# Patient Record
Sex: Male | Born: 1940
Health system: Southern US, Community
[De-identification: ages and names within clinical notes are randomized; demographics above are authoritative.]

## PROBLEM LIST (undated history)

## (undated) DIAGNOSIS — D369 Benign neoplasm, unspecified site: Secondary | ICD-10-CM

## (undated) DIAGNOSIS — G8929 Other chronic pain: Secondary | ICD-10-CM

## (undated) DIAGNOSIS — I2510 Atherosclerotic heart disease of native coronary artery without angina pectoris: Secondary | ICD-10-CM

## (undated) DIAGNOSIS — E785 Hyperlipidemia, unspecified: Secondary | ICD-10-CM

## (undated) DIAGNOSIS — I639 Cerebral infarction, unspecified: Secondary | ICD-10-CM

## (undated) DIAGNOSIS — M1990 Unspecified osteoarthritis, unspecified site: Secondary | ICD-10-CM

## (undated) DIAGNOSIS — K219 Gastro-esophageal reflux disease without esophagitis: Secondary | ICD-10-CM

## (undated) DIAGNOSIS — F419 Anxiety disorder, unspecified: Secondary | ICD-10-CM

## (undated) DIAGNOSIS — R55 Syncope and collapse: Secondary | ICD-10-CM

## (undated) DIAGNOSIS — M545 Low back pain, unspecified: Secondary | ICD-10-CM

## (undated) DIAGNOSIS — I10 Essential (primary) hypertension: Secondary | ICD-10-CM

## (undated) DIAGNOSIS — M5030 Other cervical disc degeneration, unspecified cervical region: Secondary | ICD-10-CM

## (undated) HISTORY — DX: Other cervical disc degeneration, unspecified cervical region: M50.30

## (undated) HISTORY — DX: Anxiety disorder, unspecified: F41.9

## (undated) HISTORY — DX: Benign neoplasm, unspecified site: D36.9

## (undated) HISTORY — DX: Hyperlipidemia, unspecified: E78.5

## (undated) HISTORY — DX: Atherosclerotic heart disease of native coronary artery without angina pectoris: I25.10

## (undated) HISTORY — DX: Syncope and collapse: R55

## (undated) HISTORY — PX: ANTERIOR CERVICAL DECOMP/DISCECTOMY FUSION: SHX1161

## (undated) HISTORY — DX: Gastro-esophageal reflux disease without esophagitis: K21.9

## (undated) HISTORY — PX: LUMBAR FUSION: SHX111

## (undated) HISTORY — PX: NEPHRECTOMY: SHX65

## (undated) HISTORY — DX: Essential (primary) hypertension: I10

## (undated) HISTORY — DX: Unspecified osteoarthritis, unspecified site: M19.90

## (undated) HISTORY — PX: JOINT REPLACEMENT: SHX530

## (undated) HISTORY — PX: BACK SURGERY: SHX140

---

## 1998-03-10 ENCOUNTER — Encounter: Payer: Self-pay | Admitting: Urology

## 1998-03-10 ENCOUNTER — Ambulatory Visit (HOSPITAL_COMMUNITY): Admission: RE | Admit: 1998-03-10 | Discharge: 1998-03-10 | Payer: Self-pay | Admitting: Urology

## 1998-07-04 ENCOUNTER — Encounter: Payer: Self-pay | Admitting: Cardiology

## 1998-07-04 ENCOUNTER — Inpatient Hospital Stay (HOSPITAL_COMMUNITY): Admission: EM | Admit: 1998-07-04 | Discharge: 1998-07-08 | Payer: Self-pay | Admitting: Emergency Medicine

## 1999-04-16 ENCOUNTER — Encounter: Payer: Self-pay | Admitting: Urology

## 1999-04-16 ENCOUNTER — Ambulatory Visit (HOSPITAL_COMMUNITY): Admission: RE | Admit: 1999-04-16 | Discharge: 1999-04-16 | Payer: Self-pay | Admitting: Urology

## 1999-09-06 ENCOUNTER — Encounter (INDEPENDENT_AMBULATORY_CARE_PROVIDER_SITE_OTHER): Payer: Self-pay | Admitting: *Deleted

## 1999-09-06 ENCOUNTER — Ambulatory Visit (HOSPITAL_COMMUNITY): Admission: RE | Admit: 1999-09-06 | Discharge: 1999-09-06 | Payer: Self-pay | Admitting: Gastroenterology

## 2000-02-07 ENCOUNTER — Encounter: Payer: Self-pay | Admitting: Cardiology

## 2000-02-07 ENCOUNTER — Ambulatory Visit (HOSPITAL_COMMUNITY): Admission: RE | Admit: 2000-02-07 | Discharge: 2000-02-07 | Payer: Self-pay | Admitting: Cardiology

## 2000-03-21 ENCOUNTER — Ambulatory Visit (HOSPITAL_COMMUNITY): Admission: RE | Admit: 2000-03-21 | Discharge: 2000-03-21 | Payer: Self-pay | Admitting: Family Medicine

## 2000-03-21 ENCOUNTER — Encounter: Payer: Self-pay | Admitting: Family Medicine

## 2000-07-27 ENCOUNTER — Encounter: Payer: Self-pay | Admitting: Urology

## 2000-07-27 ENCOUNTER — Ambulatory Visit (HOSPITAL_COMMUNITY): Admission: RE | Admit: 2000-07-27 | Discharge: 2000-07-27 | Payer: Self-pay | Admitting: Urology

## 2000-11-02 ENCOUNTER — Encounter: Payer: Self-pay | Admitting: Family Medicine

## 2000-11-02 ENCOUNTER — Ambulatory Visit (HOSPITAL_COMMUNITY): Admission: RE | Admit: 2000-11-02 | Discharge: 2000-11-02 | Payer: Self-pay | Admitting: Family Medicine

## 2001-02-01 ENCOUNTER — Emergency Department (HOSPITAL_COMMUNITY): Admission: EM | Admit: 2001-02-01 | Discharge: 2001-02-01 | Payer: Self-pay | Admitting: Emergency Medicine

## 2001-02-01 ENCOUNTER — Encounter: Payer: Self-pay | Admitting: Emergency Medicine

## 2001-07-18 ENCOUNTER — Encounter: Payer: Self-pay | Admitting: Urology

## 2001-07-18 ENCOUNTER — Ambulatory Visit (HOSPITAL_COMMUNITY): Admission: RE | Admit: 2001-07-18 | Discharge: 2001-07-18 | Payer: Self-pay | Admitting: *Deleted

## 2001-10-10 ENCOUNTER — Ambulatory Visit (HOSPITAL_COMMUNITY): Admission: RE | Admit: 2001-10-10 | Discharge: 2001-10-10 | Payer: Self-pay | Admitting: Family Medicine

## 2001-10-10 ENCOUNTER — Encounter: Payer: Self-pay | Admitting: Family Medicine

## 2002-08-08 ENCOUNTER — Encounter: Admission: RE | Admit: 2002-08-08 | Discharge: 2002-08-08 | Payer: Self-pay | Admitting: Urology

## 2002-08-08 ENCOUNTER — Encounter: Payer: Self-pay | Admitting: Urology

## 2002-09-07 HISTORY — PX: COLONOSCOPY W/ BIOPSIES AND POLYPECTOMY: SHX1376

## 2002-09-10 ENCOUNTER — Ambulatory Visit (HOSPITAL_COMMUNITY): Admission: RE | Admit: 2002-09-10 | Discharge: 2002-09-10 | Payer: Self-pay | Admitting: Gastroenterology

## 2002-09-10 ENCOUNTER — Encounter (INDEPENDENT_AMBULATORY_CARE_PROVIDER_SITE_OTHER): Payer: Self-pay | Admitting: Specialist

## 2002-10-03 ENCOUNTER — Encounter: Payer: Self-pay | Admitting: Family Medicine

## 2002-10-03 ENCOUNTER — Ambulatory Visit (HOSPITAL_COMMUNITY): Admission: RE | Admit: 2002-10-03 | Discharge: 2002-10-03 | Payer: Self-pay | Admitting: Family Medicine

## 2002-12-31 ENCOUNTER — Emergency Department (HOSPITAL_COMMUNITY): Admission: EM | Admit: 2002-12-31 | Discharge: 2002-12-31 | Payer: Self-pay | Admitting: *Deleted

## 2002-12-31 ENCOUNTER — Encounter: Payer: Self-pay | Admitting: *Deleted

## 2003-06-06 ENCOUNTER — Ambulatory Visit (HOSPITAL_COMMUNITY): Admission: RE | Admit: 2003-06-06 | Discharge: 2003-06-06 | Payer: Self-pay | Admitting: Family Medicine

## 2003-12-22 ENCOUNTER — Observation Stay (HOSPITAL_COMMUNITY): Admission: EM | Admit: 2003-12-22 | Discharge: 2003-12-23 | Payer: Self-pay | Admitting: *Deleted

## 2004-01-02 ENCOUNTER — Encounter: Admission: RE | Admit: 2004-01-02 | Discharge: 2004-01-02 | Payer: Self-pay | Admitting: *Deleted

## 2004-02-26 ENCOUNTER — Observation Stay (HOSPITAL_COMMUNITY): Admission: RE | Admit: 2004-02-26 | Discharge: 2004-02-27 | Payer: Self-pay | Admitting: Neurological Surgery

## 2004-09-28 HISTORY — PX: US ECHOCARDIOGRAPHY: HXRAD669

## 2004-11-06 HISTORY — PX: COLONOSCOPY: SHX174

## 2004-11-18 ENCOUNTER — Ambulatory Visit (HOSPITAL_COMMUNITY): Admission: RE | Admit: 2004-11-18 | Discharge: 2004-11-18 | Payer: Self-pay | Admitting: Gastroenterology

## 2005-03-25 ENCOUNTER — Encounter: Admission: RE | Admit: 2005-03-25 | Discharge: 2005-03-25 | Payer: Self-pay | Admitting: Neurological Surgery

## 2005-04-19 ENCOUNTER — Inpatient Hospital Stay (HOSPITAL_COMMUNITY): Admission: RE | Admit: 2005-04-19 | Discharge: 2005-04-20 | Payer: Self-pay | Admitting: Neurological Surgery

## 2005-05-13 ENCOUNTER — Ambulatory Visit (HOSPITAL_COMMUNITY): Admission: RE | Admit: 2005-05-13 | Discharge: 2005-05-13 | Payer: Self-pay | Admitting: Neurological Surgery

## 2005-06-07 ENCOUNTER — Encounter: Admission: RE | Admit: 2005-06-07 | Discharge: 2005-06-07 | Payer: Self-pay | Admitting: Neurological Surgery

## 2005-06-09 HISTORY — PX: SHOULDER ARTHROSCOPY WITH ROTATOR CUFF REPAIR: SHX5685

## 2005-06-29 ENCOUNTER — Ambulatory Visit (HOSPITAL_BASED_OUTPATIENT_CLINIC_OR_DEPARTMENT_OTHER): Admission: RE | Admit: 2005-06-29 | Discharge: 2005-06-30 | Payer: Self-pay | Admitting: Orthopedic Surgery

## 2005-08-07 HISTORY — PX: INCISION AND DRAINAGE OF WOUND: SHX1803

## 2005-08-11 ENCOUNTER — Ambulatory Visit (HOSPITAL_BASED_OUTPATIENT_CLINIC_OR_DEPARTMENT_OTHER): Admission: RE | Admit: 2005-08-11 | Discharge: 2005-08-12 | Payer: Self-pay | Admitting: Orthopedic Surgery

## 2005-11-06 HISTORY — PX: CARPAL TUNNEL RELEASE: SHX101

## 2005-12-05 ENCOUNTER — Ambulatory Visit (HOSPITAL_COMMUNITY): Admission: RE | Admit: 2005-12-05 | Discharge: 2005-12-05 | Payer: Self-pay | Admitting: Neurological Surgery

## 2007-06-10 HISTORY — PX: CARPAL TUNNEL RELEASE: SHX101

## 2007-07-05 ENCOUNTER — Ambulatory Visit (HOSPITAL_COMMUNITY): Admission: RE | Admit: 2007-07-05 | Discharge: 2007-07-05 | Payer: Self-pay | Admitting: Neurological Surgery

## 2008-03-27 ENCOUNTER — Encounter: Admission: RE | Admit: 2008-03-27 | Discharge: 2008-03-27 | Payer: Self-pay | Admitting: Neurological Surgery

## 2008-08-07 HISTORY — PX: REPLACEMENT TOTAL KNEE: SUR1224

## 2008-08-13 ENCOUNTER — Inpatient Hospital Stay (HOSPITAL_COMMUNITY): Admission: RE | Admit: 2008-08-13 | Discharge: 2008-08-15 | Payer: Self-pay | Admitting: Orthopedic Surgery

## 2008-12-14 ENCOUNTER — Inpatient Hospital Stay (HOSPITAL_COMMUNITY): Admission: EM | Admit: 2008-12-14 | Discharge: 2008-12-17 | Payer: Self-pay | Admitting: Emergency Medicine

## 2008-12-14 ENCOUNTER — Ambulatory Visit: Payer: Self-pay | Admitting: Internal Medicine

## 2008-12-14 HISTORY — PX: CARDIAC CATHETERIZATION: SHX172

## 2008-12-15 ENCOUNTER — Encounter (INDEPENDENT_AMBULATORY_CARE_PROVIDER_SITE_OTHER): Payer: Self-pay | Admitting: Internal Medicine

## 2008-12-15 HISTORY — PX: US ECHOCARDIOGRAPHY: HXRAD669

## 2009-12-03 ENCOUNTER — Encounter: Admission: RE | Admit: 2009-12-03 | Discharge: 2009-12-03 | Payer: Self-pay | Admitting: Family Medicine

## 2009-12-08 ENCOUNTER — Telehealth (INDEPENDENT_AMBULATORY_CARE_PROVIDER_SITE_OTHER): Payer: Self-pay

## 2009-12-09 ENCOUNTER — Encounter: Payer: Self-pay | Admitting: Cardiovascular Disease

## 2009-12-09 ENCOUNTER — Ambulatory Visit: Payer: Self-pay | Admitting: Cardiovascular Disease

## 2009-12-09 ENCOUNTER — Encounter (HOSPITAL_COMMUNITY): Admission: RE | Admit: 2009-12-09 | Discharge: 2010-02-03 | Payer: Self-pay | Admitting: Cardiology

## 2009-12-09 ENCOUNTER — Ambulatory Visit: Payer: Self-pay

## 2009-12-10 HISTORY — PX: CARDIOVASCULAR STRESS TEST: SHX262

## 2010-02-12 ENCOUNTER — Encounter: Admission: RE | Admit: 2010-02-12 | Discharge: 2010-02-12 | Payer: Self-pay | Admitting: Family Medicine

## 2010-05-14 ENCOUNTER — Encounter
Admission: RE | Admit: 2010-05-14 | Discharge: 2010-05-14 | Payer: Self-pay | Source: Home / Self Care | Attending: Family Medicine | Admitting: Family Medicine

## 2010-06-10 NOTE — Assessment & Plan Note (Signed)
Summary: Cardiology Nuclear Testing  Nuclear Med Background Indications for Stress Test: Evaluation for Ischemia   History: Echo, Heart Catheterization, Myocardial Perfusion Study  History Comments: '01 ZOX:WRUEAV, EF=59%; 8/10 Echo: EF=60-65%; 8/10 Cath: EF= 55-60%, moderate- severe CAD  Symptoms: Chest Pressure, Diaphoresis, DOE, Fatigue, Palpitations, Syncope  Symptoms Comments: CP>back. Last episode of WU:JWJX night.   Nuclear Pre-Procedure Cardiac Risk Factors: Family History - CAD, History of Smoking, Hypertension, Lipids, TIA Caffeine/Decaff Intake: none NPO After: 12:00 AM Lungs: Clear IV 0.9% NS with Angio Cath: 22g     IV Site: (R) AC IV Started by: Irean Hong RN Chest Size (in) 48     Height (in): 68 Weight (lb): 223 BMI: 34.03 Tech Comments: Held Metoprolol for 48 x hours  Nuclear Med Study 1 or 2 day study:  1 day     Stress Test Type:  Stress Reading MD:  Charlton Haws, MD     Referring MD:  [name], MD Resting Radionuclide:  Technetium 34m Tetrofosmin     Resting Radionuclide Dose:  10.9 mCi  Stress Radionuclide:  Technetium 72m Tetrofosmin     Stress Radionuclide Dose:  33.0 mCi   Stress Protocol Exercise Time (min):  4:30 min     Max HR:  144 bpm     Predicted Max HR:  153 bpm  Max Systolic BP: 201 mm Hg     Percent Max HR:  94.12 %     METS: 6.4 Rate Pressure Product:  91478    Stress Test Technologist:  Rea [employer] CMA-N     Nuclear Technologist:  Domenic Polite CNMT  Rest Procedure  Myocardial perfusion imaging was performed at rest 45 minutes following the intravenous administration of Myoview Technetium 82m Tetrofosmin.  Stress Procedure  The patient exercised for 4:30.  The patient stopped due to fatigue and denied any chest pain.  There were nonspecific ST-T wave changes and a mild hypertensive response to exercise.  Myoview was injected at peak exercise and myocardial perfusion imaging was performed after a brief delay.  QPS Raw Data  Images:  Motion artifact in both image sets Stress Images:  NI: Uniform and normal uptake of tracer in all myocardial segments. Rest Images:  Normal homogeneous uptake in all areas of the myocardium. Subtraction (SDS):  SDS 2 Transient Ischemic Dilatation:  .92  (Normal <1.22)  Lung/Heart Ratio:  .32  (Normal <0.45)  Quantitative Gated Spect Images QGS EDV:  124 ml QGS ESV:  48 ml QGS EF:  61 % QGS cine images:  normal  Findings Low risk nuclear study Evidence for inferior ischemia      Overall Impression  Exercise Capacity: Poor exercise capacity. BP Response: Hypertensive blood pressure response. Clinical Symptoms: Dysnpnea ECG Impression: No significant ST segment change suggestive of ischemia. Overall Impression: Possible small inferior wall infarct at mid and apical level with mild peri infarct ischemia Overall Impression Comments: Quality of study diminished by motion artifact

## 2010-06-10 NOTE — Progress Notes (Signed)
Summary: Nuc pre-procedure  Phone Note Outgoing Call Call back at cell # [phone]   Call placed by: Irean Hong, RN,  December 08, 2009 2:35 PM Summary of Call: Reviewed information on Myoview Information Sheet (see scanned document for further details).  Spoke with pt. by Darrick Penna      Nuclear Med Background Indications for Stress Test: Evaluation for Ischemia   History: Echo, Heart Catheterization, Myocardial Perfusion Study  History Comments: 01 MPS EF=59% normal. 08/10 Echo EF=60-65%. 08/10 Cath EF= 55-60%, moderate severe diagonal of RCA.   Symptoms: Chest Pain, DOE, Syncope  Symptoms Comments: Chest pain is bilateral and into back.   Nuclear Pre-Procedure Cardiac Risk Factors: Family History - CAD, Hypertension, Lipids

## 2010-06-14 ENCOUNTER — Ambulatory Visit: Payer: Self-pay | Admitting: Cardiology

## 2010-08-14 LAB — DIFFERENTIAL
Basophils Absolute: 0 10*3/uL (ref 0.0–0.1)
Basophils Relative: 0 % (ref 0–1)
Eosinophils Absolute: 0.1 10*3/uL (ref 0.0–0.7)
Eosinophils Relative: 2 % (ref 0–5)
Lymphocytes Relative: 23 % (ref 12–46)
Lymphs Abs: 1.2 10*3/uL (ref 0.7–4.0)
Monocytes Absolute: 0.8 10*3/uL (ref 0.1–1.0)
Monocytes Relative: 16 % — ABNORMAL HIGH (ref 3–12)
Neutro Abs: 3.1 10*3/uL (ref 1.7–7.7)
Neutrophils Relative %: 58 % (ref 43–77)

## 2010-08-14 LAB — BASIC METABOLIC PANEL
BUN: 11 mg/dL (ref 6–23)
CO2: 24 mEq/L (ref 19–32)
CO2: 24 mEq/L (ref 19–32)
Calcium: 8.1 mg/dL — ABNORMAL LOW (ref 8.4–10.5)
Calcium: 8.4 mg/dL (ref 8.4–10.5)
Calcium: 8.4 mg/dL (ref 8.4–10.5)
Calcium: 9.4 mg/dL (ref 8.4–10.5)
Chloride: 107 mEq/L (ref 96–112)
Chloride: 107 mEq/L (ref 96–112)
Chloride: 109 mEq/L (ref 96–112)
Creatinine, Ser: 1.06 mg/dL (ref 0.4–1.5)
Creatinine, Ser: 1.12 mg/dL (ref 0.4–1.5)
Creatinine, Ser: 1.24 mg/dL (ref 0.4–1.5)
GFR calc Af Amer: >60 mL/min (ref >60)
GFR calc Af Amer: >60 mL/min (ref >60)
GFR calc Af Amer: >60 mL/min (ref >60)
GFR calc Af Amer: >60 mL/min (ref >60)
GFR calc non Af Amer: >60 mL/min (ref >60)
GFR calc non Af Amer: >60 mL/min (ref >60)
Glucose, Bld: 88 mg/dL (ref 70–99)
Glucose, Bld: 95 mg/dL (ref 70–99)
Glucose, Bld: 98 mg/dL (ref 70–99)
Potassium: 3.9 mEq/L (ref 3.5–5.1)
Sodium: 136 mEq/L (ref 135–145)
Sodium: 139 mEq/L (ref 135–145)

## 2010-08-14 LAB — POCT CARDIAC MARKERS
CKMB, poc: <1 ng/mL — ABNORMAL LOW (ref 1.0–8.0)
CKMB, poc: <1 ng/mL — ABNORMAL LOW (ref 1.0–8.0)
Myoglobin, poc: 45.8 ng/mL (ref 12–200)
Myoglobin, poc: 52.9 ng/mL (ref 12–200)
Troponin i, poc: <0.05 ng/mL (ref 0.00–0.09)
Troponin i, poc: <0.05 ng/mL (ref 0.00–0.09)
Troponin i, poc: <0.05 ng/mL (ref 0.00–0.09)

## 2010-08-14 LAB — CBC
HCT: 33.2 % — ABNORMAL LOW (ref 39.0–52.0)
HCT: 33.6 % — ABNORMAL LOW (ref 39.0–52.0)
Hemoglobin: 11.4 g/dL — ABNORMAL LOW (ref 13.0–17.0)
Hemoglobin: 11.7 g/dL — ABNORMAL LOW (ref 13.0–17.0)
Hemoglobin: 11.8 g/dL — ABNORMAL LOW (ref 13.0–17.0)
MCHC: 34.3 g/dL (ref 30.0–36.0)
MCHC: 35 g/dL (ref 30.0–36.0)
MCHC: 35 g/dL (ref 30.0–36.0)
MCV: 88.7 fL (ref 78.0–100.0)
MCV: 88.7 fL (ref 78.0–100.0)
MCV: 89.2 fL (ref 78.0–100.0)
Platelets: 160 10*3/uL (ref 150–400)
Platelets: 164 10*3/uL (ref 150–400)
RBC: 3.72 MIL/uL — ABNORMAL LOW (ref 4.22–5.81)
RBC: 3.79 MIL/uL — ABNORMAL LOW (ref 4.22–5.81)
RBC: 3.81 MIL/uL — ABNORMAL LOW (ref 4.22–5.81)
RBC: 4.56 MIL/uL (ref 4.22–5.81)
RDW: 14.1 % (ref 11.5–15.5)
RDW: 14.5 % (ref 11.5–15.5)
RDW: 14.6 % (ref 11.5–15.5)
WBC: 4.9 10*3/uL (ref 4.0–10.5)
WBC: 5.3 10*3/uL (ref 4.0–10.5)
WBC: 5.8 10*3/uL (ref 4.0–10.5)

## 2010-08-14 LAB — PROTIME-INR
INR: 0.9 (ref 0.00–1.49)
Prothrombin Time: 12.4 seconds (ref 11.6–15.2)

## 2010-08-14 LAB — LIPID PANEL
HDL: 32 mg/dL — ABNORMAL LOW (ref >39)
Total CHOL/HDL Ratio: 5 RATIO
Triglycerides: 240 mg/dL — ABNORMAL HIGH (ref <150)

## 2010-08-14 LAB — D-DIMER, QUANTITATIVE: D-Dimer, Quant: 0.38 ug/mL-FEU (ref 0.00–0.48)

## 2010-08-14 LAB — CK TOTAL AND CKMB (NOT AT ARMC)
CK, MB: 0.9 ng/mL (ref 0.3–4.0)
Relative Index: INVALID (ref 0.0–2.5)
Total CK: 34 U/L (ref 7–232)

## 2010-08-18 LAB — COMPREHENSIVE METABOLIC PANEL
AST: 24 U/L (ref 0–37)
CO2: 23 mEq/L (ref 19–32)
Calcium: 9.1 mg/dL (ref 8.4–10.5)
Creatinine, Ser: 1.11 mg/dL (ref 0.4–1.5)
GFR calc Af Amer: >60 mL/min (ref >60)
GFR calc non Af Amer: >60 mL/min (ref >60)
Sodium: 140 mEq/L (ref 135–145)
Total Protein: 6.5 g/dL (ref 6.0–8.3)

## 2010-08-18 LAB — URINALYSIS, ROUTINE W REFLEX MICROSCOPIC
Ketones, ur: NEGATIVE mg/dL
Nitrite: NEGATIVE
Protein, ur: NEGATIVE mg/dL
pH: 6.5 (ref 5.0–8.0)

## 2010-08-18 LAB — CBC
MCHC: 35.1 g/dL (ref 30.0–36.0)
MCHC: 35.7 g/dL (ref 30.0–36.0)
MCV: 92.3 fL (ref 78.0–100.0)
MCV: 93.1 fL (ref 78.0–100.0)
Platelets: 164 10*3/uL (ref 150–400)
Platelets: 198 10*3/uL (ref 150–400)
RBC: 4.36 MIL/uL (ref 4.22–5.81)
RDW: 13.4 % (ref 11.5–15.5)
RDW: 13.9 % (ref 11.5–15.5)
WBC: 6.1 10*3/uL (ref 4.0–10.5)

## 2010-08-18 LAB — BASIC METABOLIC PANEL
BUN: 14 mg/dL (ref 6–23)
BUN: 8 mg/dL (ref 6–23)
Calcium: 7.9 mg/dL — ABNORMAL LOW (ref 8.4–10.5)
Calcium: 8.5 mg/dL (ref 8.4–10.5)
Chloride: 103 mEq/L (ref 96–112)
Creatinine, Ser: 0.84 mg/dL (ref 0.4–1.5)
GFR calc non Af Amer: >60 mL/min (ref >60)
Glucose, Bld: 107 mg/dL — ABNORMAL HIGH (ref 70–99)
Sodium: 135 mEq/L (ref 135–145)

## 2010-08-18 LAB — PROTIME-INR
INR: 1.8 — ABNORMAL HIGH (ref 0.00–1.49)
Prothrombin Time: 12.5 seconds (ref 11.6–15.2)
Prothrombin Time: 14.8 seconds (ref 11.6–15.2)
Prothrombin Time: 21.5 seconds — ABNORMAL HIGH (ref 11.6–15.2)

## 2010-08-18 LAB — ABO/RH: ABO/RH(D): A POS

## 2010-08-18 LAB — TYPE AND SCREEN: ABO/RH(D): A POS

## 2010-08-18 LAB — APTT: aPTT: 35 seconds (ref 24–37)

## 2010-09-21 NOTE — H&P (Signed)
Jake Dunn, Jake Dunn                ACCOUNT NO.:  [id number]   MEDICAL RECORD NO.:  [id number]          PATIENT TYPE:  INP   LOCATION:  2920                         FACILITY:  [facility]   PHYSICIAN:  Kela Millin, M.D.DATE OF BIRTH:  01-Feb-1941   DATE OF ADMISSION:  12/14/2008  DATE OF DISCHARGE:                              HISTORY & PHYSICAL   PRIMARY CARE PHYSICIAN:  Dr. Elias Else.   CARDIOLOGIST:  Dr. [name].   CHIEF COMPLAINT:  Chest pain.   HISTORY OF PRESENT ILLNESS:  The patient is a 70 year old white male  with past medical history significant for hypertension, hyperlipidemia,  who presents with complaints of worsening chest pain over the past 3  days.  He describes the chest pain as left precordial in location with  radiation to his back, sharp, 8-10 out of 10 in intensity, constant over  the past 3 days but with worsening as already mentioned.  He admits to  associated shortness of breath as well as nausea but no vomiting, and  ?diaphoresis.  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  He states  that the pain is aggravated by leaning forward and certain movements.  He admits to mild nonproductive cough over the past 2 days.  He denies  fevers, orthopnea, PND, dysuria, melena, hematochezia and no diarrhea.  He also denies any paresthesias, palpitations, dizziness.   He was seen in the ED and an EKG was negative for acute ischemic  changes, his point of care markers were negative, he was noted to be  mildly tachycardiac and a CT angiogram of his chest and abdomen was  obtained and it was negative for PE, also no aortic dissection.  It was  noted that there was a minimal right lower lobe opacity - pneumonia  versus atelectasis.  He was given sublingual nitroglycerin as well as  aspirin and subsequently IV morphine with no relief of his symptoms and  he is admitted for further evaluation  and management.  Upon review of  his E chart records it is noted that the patient had an adenosine  Cardiolite with limited exercise in August 2005 that showed no  significant perfusion defect, his ejection fraction was noted to be 45-  50%.   PAST MEDICAL HISTORY:  1. As above.  2. History of osteoarthritis and status post right total knee      replacement in March 2010.  3. History of renal cell cancer and status post left nephrectomy.  4. The history of cervical spondylosis with myelopathy and status post      surgery.  5. History of low back pain and status post surgeries in the past.   MEDICATIONS:  1. Nabumetone 500 mg p.o. daily.  2. Plavix 75 mg p.o. daily.  3. Ativan 0.5 mg t.i.d.  4. Pravastatin 40 mg p.o. daily.  5. Valtrex 500 mg p.o. daily.  6. Nifedipine 30 mg p.o. daily.   ALLERGIES:  NKDA.   SOCIAL HISTORY:  He quit tobacco some 30 years  ago after smoking  briefly.  Occasional beer.   FAMILY HISTORY:  Positive for coronary artery disease in his father and  brother.   REVIEW OF SYSTEMS:  As per HPI, other review of systems negative.   PHYSICAL EXAM:  GENERAL:  The patient is an obese elderly white male, in  moderate distress secondary to pain, no respiratory distress.  VITAL SIGNS:  His blood pressure is 144/87, initially 204/108, his pulse  is 85, highest pulse in the ED 110, respiratory rate is 18 and O2  saturation is 100%.  HEENT:  PERRL, EOMI, sclerae anicteric, moist mucous membranes and no  oral exudates.  NECK:  Supple, no adenopathy, no thyromegaly and no JVD.  LUNGS:  Decreased breath sounds at the bases, no crackles or wheezes.  CARDIOVASCULAR:  Regular rate and rhythm.  Normal S1-S2.  No S3  appreciated.  No reproducible chest wall tenderness.  ABDOMEN:  Obese, bowel sounds present, nontender, nondistended.  No  organomegaly and no masses palpable.  EXTREMITIES:  No cyanosis and no edema.  NEURO:  He is alert and oriented x3.  Cranial nerves  II-XII grossly  intact.  Nonfocal exam.   LABORATORY DATA:  CT angiogram of the chest and abdomen as above.  Chest  x-ray - no acute cardiopulmonary findings.  Point of care markers  negative x1.  His white cell count is 5.3, hemoglobin 13.8, hematocrit  40.4, platelet count 189, neutrophil count of 58%.  His sodium is 141  with a potassium of 3.9.  His chloride is 107, CO2 of 24, glucose of 86,  BUN 16, creatinine of 1.12, calcium is 9.4.  His D-dimer is 0.38.   ASSESSMENT AND PLAN:  Problem #1.  Chest pain - cannot rule out unstable  angina, although chest pain atypical.  CT angiogram negative for PE and  no dissection.  It does reveal minimal infiltrates - right lower lobe  atelectasis versus pneumonia.  Will start on nitro paste, continue  aspirin, also place on beta blocker.  Cycle cardiac enzymes and consult  cardiology for further recommendations - as already noted above his  cardiologist is Dr. Swaziland.  Will place the patient on empiric  antibiotics for possible pneumonia.  We will also place on a PPI, and  continue his benzodiazepines for anxiety.  Problem #2.  Uncontrolled hypertension - initially markedly elevated  blood pressures improved on recheck in the ED as discussed above.  Will  place on beta blockers for now, monitor blood pressures in step-down  unit and further manage accordingly.  Problem #3.  History of hyperlipidemia - continue pravastatin.  Problem #4.  History of renal cell cancer - status post left nephrectomy  in the past.  Problem #5.  History of osteoarthritis - as above status post right knee  replacement in March 2010.      Kela Millin, M.D.  Electronically Signed     ACV/MEDQ  D:  12/15/2008  T:  12/16/2008  Job:  [id number]   cc:   Dr. [name]  Dr. Nicholos Johns

## 2010-09-21 NOTE — Op Note (Signed)
Jake Dunn, Jake Dunn NO.:  [id number]   MEDICAL RECORD NO.:  [id number]          PATIENT TYPE:  INP   LOCATION:  5033                         FACILITY:  [facility]   PHYSICIAN:  Loreta Ave, M.D. DATE OF BIRTH:  21-May-1940   DATE OF PROCEDURE:  DATE OF DISCHARGE:                               OPERATIVE REPORT   PREOPERATIVE DIAGNOSES:  End-stage degenerative arthritis right knee,  most marked lateral compartment, patellofemoral joint.   POSTOPERATIVE DIAGNOSIS:  End-stage degenerative arthritis right knee,  most marked lateral compartment, patellofemoral joint.   PROCEDURE:  Right total knee replacement utilizing Stryker triathlon  prosthesis.  Modified minimally invasive approach.  Soft tissue  balancing including lateral retinacular release.  Cemented pegged  posterior stabilized #6 femoral component.  Cemented #6 tibial  component, 9-mm polyethylene insert.  Resurfacing of 38-mm patellar  component.   SURGEON:  Loreta Ave, MD   ASSISTANT:  Genene Churn. Barry Dienes, [place], present throughout the entire case and  necessary for timely completion of the procedure.   ANESTHESIA:  General   BLOOD LOSS:  Minimal.   SPECIMENS:  None.   CULTURES:  None.   COMPLICATIONS:  None.   DRESSINGS:  Soft compressive with knee immobilizer.   DRAIN:  Hemovac x1.   TOURNIQUET TIME:  One hour and 15 minutes.   PROCEDURE IN DETAIL:  The patient was brought to the operating room,  placed on the operating table in supine position.  After adequate  anesthesia was obtained, right knee examined.  Fairly good extension and  flexion.  Valgus alignment correctable.  Marked lateral patellofemoral  tracking tethering.  Tourniquet applied.  Prepped and draped in usual  sterile fashion.  Exsanguinated with elevation Esmarch.  Tourniquet  inflated to 350 mmHg.  Straight incision above the patella down to  tibial tubercle.  Medial arthrotomy vastus splitting preserving quad  tendon.   Knee exposed.  Marked tethering lateral patellofemoral joint.  Distal femur exposed.  Intramedullary guide placed.  Distal cut 10 mm  set at 5 degrees of valgus.  Using the epicondylar axis sized, cut, and  fitted for #6 component.  Proximal tibial resection extramedullary guide  3 degrees posterior slope cut.  Size #6 component.  Little bit of a  capsular release laterally.  Trials put in place.  #6 above, #6 below,  and with a 9-mm insert full extension, full flexion, nicely balanced  knee.  Patella exposed.  Size drilled and fitted for 38-mm component.  So much tethering, I had to do a lateral release, which was performed  from the vastus lateralis superiorly down to a lateral joint line  inferiorly.  With that, I was very pleased with the patellofemoral  tracking alignment, stability.  Tibia was marked for appropriate  rotation with trials.  It was then hand reamed.  All trials removed.  Copious irrigation with pulse irrigating device.  Cement prepared and  placed on all components.  All were firmly seated.  Polyethylene  attached to tibia and knee reduced.  Clamp on the patella.  Once cement  hardened, reexamined.  Full extension,  full flexion, good alignment,  good stability, good patellofemoral tracking.  Hemovac placed through a  separate stab wound, brought out laterally.  Wound irrigated.  Arthrotomy closed #1 Vicryl.  Skin and subcutaneous tissue with Vicryl  and staples.  Knee injected with Marcaine.  Hemovac clamped.  Sterile  compressive dressing applied.  Tourniquet was inflated and removed.  Knee immobilizer applied. Anesthesia reversed.  Brought to recovery  room.  Tolerated surgery well.  No complications.      Loreta Ave, M.D.  Electronically Signed     DFM/MEDQ  D:  08/14/2008  T:  08/15/2008  Job:  [id number]

## 2010-09-21 NOTE — Op Note (Signed)
Jake Dunn, Jake Dunn                ACCOUNT NO.:  [id number]   MEDICAL RECORD NO.:  [id number]          PATIENT TYPE:  AMB   LOCATION:  SDS                          FACILITY:  [facility]   PHYSICIAN:  Stefani Dama, M.D.  DATE OF BIRTH:  1940-10-03   DATE OF PROCEDURE:  07/05/2007  DATE OF DISCHARGE:                               OPERATIVE REPORT   PREOPERATIVE DIAGNOSIS:  Left carpal tunnel syndrome.   POSTOPERATIVE DIAGNOSIS:  Left carpal tunnel syndrome.   PROCEDURE:  Left carpal tunnel release   SURGEON:  Stefani Dama, M.D.   ANESTHESIA:  General endotracheal.   INDICATIONS:  Jake Dunn is a 70 year old male who has had problems  with spondylosis and bilateral carpal tunnel syndrome.  He has had a  previous release of the right carpal tunnel, now has left carpal tunnel  symptoms and wishes to undergo a transcarpal release.  He is taken to  the operating room.   PROCEDURE:  The patient was brought to the operating room and after the  induction of some modest sedation with propofol, the left upper  extremity was prepped with DuraPrep and draped sterilely.  A stockinette  over the left wrist was opened then and the region of the midportion of  the wrist was marked and a line was drawn from the midportion of the  wrist to the fourth ray.  An incision was created along this area after  infiltrating with 6 mL of lidocaine 1% with epinephrine.  As the  subcutaneous tissues were divided and then the transcarpal ligament was  identified, the ligament was divided and sectioned very carefully until  the underlying nerve was identified.  The nerve was noted to spring  through the opening of the transcarpal ligament and care was taken to  protect the nerve as the division was carried down the length of the  transcarpal ligament, first distally and then more proximally under the  wrist crease.  Once the transcarpal ligament was divided completely,  care was taken to make sure that no  branches were transected during this  opening and hemostasis was then achieved very carefully.  The skin was  then closed with interrupted 4-0 nylon sutures in a vertical mattress  closure.  A dry sterile dressing was applied to the wrist.  The patient  tolerated the procedure well.  Blood loss was nil.      Stefani Dama, M.D.  Electronically Signed     HJE/MEDQ  D:  07/05/2007  T:  07/06/2007  Job:  [id number]

## 2010-09-21 NOTE — Consult Note (Signed)
NAMEJAVELL, BLACKBURN NO.:  [id number]   MEDICAL RECORD NO.:  [id number]          PATIENT TYPE:  INP   LOCATION:  1824                         FACILITY:  [facility]   PHYSICIAN:  Bevelyn Buckles. Bensimhon, MDDATE OF BIRTH:  01/03/1941   DATE OF CONSULTATION:  12/14/2008  DATE OF DISCHARGE:                                 CONSULTATION   PRIMARY CARDIOLOGIST:  Peter M. Swaziland, MD.   REASON FOR CONSULTATION:  Chest pain.   CONSULTING PHYSICIAN:  [facility], clinical.   HISTORY OF PRESENT ILLNESS:  Mr. Mcneill a very pleasant 70 year old male  with a history of hypertension, hyperlipidemia, and a solitary kidney  status post left nephrectomy due to renal cell carcinoma, and  degenerative joint disease status post right knee replacement in March.  He denies any history of known coronary artery disease.  He had a cath  he says about 10 years ago with Dr. Swaziland and was told his coronaries  were okay at that time.  He was admitted in August of 2005 with chest  pain and had a Myoview, which showed an EF of 45 to 50% with no evidence  of ischemia or infarct.  His EF was thought secondary to hypertensive  heart disease.   He tells me he was doing well until Thursday.  Since that time, he has  had stuttering left-sided chest pain.  This morning, the chest pain was  much more severe and it lasted all day.  It was radiating through to his  back and is  associated with diaphoresis, nausea, and shortness of  breath.   He came to the emergency room.  His EKG was normal.  He had 2 sets of  point-of-care markers that were normal.  He had a CT scan of the chest  and abdomen, which showed no evidence of a pulmonary embolus or  dissection.  There was question of a minimal right lower lobe opacity, a  question of developing pneumonia.  He was treated with morphine and  nitroglycerin with no significant relief.   REVIEW OF SYSTEMS:  He denies any diarrhea, no fevers, no chills, no  focal neurologic defects.  He has not had any dysuria, no cough.  He  does have some arthritis pain, which is chronic.  He has not had any  trauma to the chest wall.  The remainder of the review of systems is  negative for HPI and Problem List.   PROBLEM LIST:  1. History of atypical chest pain.      a.     Status post cardiac catheterization approximately 10 years       ago.  The coronaries are okay.      b.     A Myoview in 2005 showed an EF of 45 to 50% with no ischemia       or infarct, EF thought due hypertensive heart disease.  2. Severe hypertension.  3. Hyperlipidemia.  4. Solitary kidney status post left nephrectomy secondary to renal      cell carcinoma.  5. Degenerative joint disease.  6. History of herpes  infection.   CURRENT MEDICATIONS:  Include:  1. Pravastatin 40 a day.  2. Plavix 75 a day.  3. Valtrex.  4. Nabumetone.  5. Procardia 30 mg a day.   ALLERGIES:  No known drug allergies.   SOCIAL HISTORY:  He is married.  He is a retired Visual merchandiser.  He has a  history of just very minimal tobacco use, none currently, no significant  alcohol.   FAMILY HISTORY:  Notable for coronary artery disease in his father and  his brother, and his brother had coronary artery bypass grafting in  2005.   PHYSICAL EXAMINATION:  GENERAL:  He is uncomfortable.  VITAL SIGNS:  Respirations are unlabored.  Blood pressure currently is  133/77, heart rate 73, he is sating 100% on 2 L nasal cannula.  HEENT:  Normal.  NECK:  Supple.  No obvious JVD.  Carotid are 2+ bilaterally without any  bruits.  There is no lymphadenopathy or thyromegaly appreciated.  CARDIAC:  PMI is nonpalpable.  He has distant heart sounds.  He is  regular with no obvious murmurs, rubs, or gallops.  LUNGS:  Clear.  ABDOMEN:  Obese, nontender, and nondistended.  No hepatosplenomegaly, no  bruits, no masses.  EXTREMITIES:  Warm with no cyanosis, clubbing, or  edema.  He has a scar on his right knee from his previous  knee  replacement.  Distal pulses are good.  NEURO:  Alert and oriented x3.  Cranial nerves II-XII are intact.  He  moves all 4 extremities without difficulty. Affect is pleasant.   EKG shows sinus rhythm with no evidence of ST-T wave abnormalities.  Point-of-care markers are negative x2 with troponin of less than 0.05  and a CK-MB of less than 1.  Sodium 141, potassium 3.9, BUN of 16,  creatinine of 1.12, glucose 86.  D-dimer was 0.38.  INR was 0.9.  CBC:  White count of 35.3, hemoglobin 13.8, platelets of 189.  A CT of the  abdomen and chest showed no evidence of PE or dissection.  There was a  minimal opacity in the right lower lobe, which thought was likely due to  atelectasis but could not exclude developing pneumonia.   ASSESSMENT:  1. Severe chest pain with a negative computerized tomography scan as      above.  2. Severe hypertension.  3. Solitary kidney secondary to left nephrectomy due to renal cell      carcinoma.   PLAN/DISCUSSION:  His chest pain and his associated symptoms are very  concerning for unstable angina, but there are no objective signs of  ischemia despite prolonged chest pain.  We are treating him with beta  blocker, nitroglycerin, heparin, aspirin, Plavix, and narcotics.  If we  are unable to get him comfortable, he will likely need  cardiac catheterization tonight.  If we can get him comfortable, we will  plan on doing it in the morning.  We will check an echocardiogram and we  will hydrate him pre-cath as he is high risk for contrast nephropathy  with his single kidney and the dye exposure he has already had today.      Bevelyn Buckles. Bensimhon, MD  Electronically Signed     DRB/MEDQ  D:  12/14/2008  T:  12/14/2008  Job:  [id number]

## 2010-09-21 NOTE — Discharge Summary (Signed)
Jake Dunn, Jake Dunn                ACCOUNT NO.:  [id number]   MEDICAL RECORD NO.:  [id number]          PATIENT TYPE:  INP   LOCATION:  2920                         FACILITY:  [facility]   PHYSICIAN:  Kela Millin, M.D.DATE OF BIRTH:  [birth year]   DATE OF ADMISSION:  12/14/2008  DATE OF DISCHARGE:  12/17/2008                               DISCHARGE SUMMARY   DISCHARGE DIAGNOSES:  1. Recurrent chest pain - Noncardiac per Cardiology.  2. Coronary artery disease - Status post cardiac cath on December 14, 2008, by Dr. Deborah Chalk revealing moderate to moderately severe 2-      vessel coronary artery disease with 95% stenosis in the small acute      marginal branch of the right coronary artery with 50% to 60%      narrowing beyond the crux and diffuse but severe disease in 2      diagonal vessels of the left anterior descending.  Dr. Deborah Chalk      noted that it was hard to know which one of these vessels might be      a culprit vessel even his persistent chest pain was possibly      related to angina.  3. Probable pneumonia, right lower lobe.  4. Uncontrolled hypertension.  5. History of anxiety.  6. History of hyperlipidemia.  7. History of renal cell cancer - Status post left nephrectomy in the      past.  8. History of osteoarthritis.   PROCEDURES AND STUDIES:  1. Cardiac catheterization on December 14, 2008 - Normal left ventricular      function.  Ejection fraction 55% to 60%.  Moderate to moderately      severe 2-vessel coronary artery disease with 95% stenosis in the      small acute marginal branch of the right coronary artery with 50%      to 60% narrowing beyond the crux and diffuse but severe disease in      2 diagonal vessels of the left anterior descending.  2. CT angiogram of the chest - No evidence of thoracic aortic      dissection.  The origins of the great vessels appear patent.  No      evidence of acute PE.  Minimal opacity posteriorly in the right      lower lobe may  be due to atelectasis.  Developing pneumonia cannot      be excluded.  3. CT angiogram of the abdomen - No evidence of abdominal aortic      dissection.  Mild atheromatous change.  No acute abnormality on the      CT of the abdomen.   BRIEF HISTORY:  The patient is a 70 year old white male with the above-  listed medical problems who presented with chest pain described as left  precordial in location with radiation to his back, 8 to 10/10 in  intensity, and constant over 3 days but with worsening in the intensity.  He reported that the pain was aggravated by leaning forward and certain  movements.  He admitted to a mild  nonproductive cough in the 2 days  prior to admission.  He admitted to nausea and shortness of breath been  associated with the chest pain.  He was admitted for further evaluation  and management.   Please see the full dictated admission history and physical for the  details of the admission physical exam, as well as the laboratory data.   HOSPITAL COURSE:  1. Severe chest pain - Upon admission, an EKG was obtained which did      not show any acute ischemic findings.  Cardiac enzymes were cycled      and were negative.  He was started on aspirin, his Plavix was      continued, and he was also started on beta-blockers and continued      on his pravastatin.  In the ED, he had a CT angiogram of chest and      abdomen and this was negative for PE, also negative for dissection.      Cardiology was consulted and Dr. Gala Romney saw the patient      initially and he was placed on IV heparin, as well as IV      nitroglycerin and narcotics for pain management.  Because his pain      persisted even with these interventions, Cardiology followed up      with the patient that night and did a cardiac catheterization and      the results are as stated above.  Dr. Deborah Chalk noted following the      procedure that patient was chest pain free and they felt that it      was best to manage  him medically and he also stated that it was      hard to know which one of those vessels would be a culprit vessel      even if this persistent chest pain was related to angina.  Dr.      [place], who is the patient's long-time cardiologist, followed up      with the patient and indicated that patient had had a similar      episode several years ago and that it eventually just resolved.      Dr. Elvis Coil impression was that he was having acute recurrent      chest pain with a spasmodic quality and felt that it was more      musculoskeletal.  He recommended adding muscle relaxants and anti-      inflammatories for pain control and this was done and the patient's      symptoms continued to improve.  The IV nitroglycerin was      discontinued and patient was mobilized.  Today Cardiology followed      up with patient and he is still having some intermittent chest      pains but overall much decreased and adequately controlled with      anti-inflammatories, muscle relaxants, and oral narcotics.  Dr.      [place]and recommended discharge home from his standpoint and he will      follow up with patient in 2 weeks.  2. Coronary artery disease - As discussed above, patient had a cardiac      cath on December 14, 2008, which revealed normal left ventricular      function with moderate to moderately severe 2-vessel coronary      artery disease with 95% stenosis in the small acute marginal branch      of the right coronary artery with  50% to 60% narrowing beyond the      crux and diffuse but severe disease in 2 diagonal vessels of the      left anterior descending.  As already mentioned above, Dr. Deborah Chalk      indicated that it was hard to know which one of these vessels might      be a culprit vessel even if this persistent chest pain was related      to angina.  They recommended managing the patient medically.  Dr.      [place] followed up with patient and after reviewing the cath films      stated that  there was a link-like area in the first diagonal that      is 60% to 70% and that there are sequential lesions in the second      diagonal 50% to 70%.  He stated that the patient's pain though was      more musculoskeletal.  He ordered a 2D echocardiogram and the echo      showed an ejection fraction of 60% to 65%.  Wall motion was normal      - No regional wall motion abnormalities and Doppler parameters were      consistent with abnormal left ventricular relaxation.  He agreed      with medical management and he was maintained on aspirin, Plavix,      Lopressor, and his anti-lipid agent in the hospital.  He is to      continue this upon discharge.  As above, his pain is improved and      he is to follow up with Dr. Sw[place]iland in 2 weeks.  3. Uncontrolled hypertension - Upon admission, he was placed on the      nitro drip and his blood pressures improved.  He was also on beta-      blockers as already mentioned above.  Even after the nitroglycerin      drip was discontinued, the patient's blood pressures have remained      well controlled on the beta-blocker at this time.  I discussed his      blood pressure management with Dr. [place] this morning and he      agrees for patient to be discharged on metoprolol at this time and      hold off the quinapril and Procardia he was on previously until he      follows up with him in 2 weeks.  4. Probable pneumonia - As discussed above, CT angiogram of his chest      revealed probable right lower lobe infiltrate.  He was placed on      antibiotics in the hospital and he is to complete them upon      discharge.  He has remained afebrile and hemodynamically stable.   His other chronic medical problems remained stable during this hospital  stay and he was maintained on his outpatient medications and is to  continue them upon discharge except as indicated above.   DISCHARGE MEDICATIONS:  1. Metoprolol 25 mg one p.o. b.i.d.  2. Avelox 400 mg one p.o.  daily for 3 more days.  3. Skelaxin 800 mg one p.o. t.i.d. p.r.n.  4. Prilosec 400 mg one p.o. daily.  5. Ibuprofen p.r.n. as directed.  6. Percocet 1 to 2 tablets p.o. every 4 to 6 hours p.r.n.  7. Patient to continue Plavix 75 mg p.o. daily.  8. Pravastatin 40 mg p.o. daily.  9. Valtrex as  previously.  10.Vitamin B6 as previously.  11.Lorazepam 0.5 mg t.i.d.   Patient instructed to hold off nifedipine and quinapril until followup  with Dr. Sw[place]iland in 2 weeks.   Patient to discontinue nabumetone as instructed.   FOLLOWUP CARE:  1. Dr. Swaziland in 2 weeks.  2. Primary care physician/Dr. Nicholos Johns in 1 to 2 weeks.   DISCHARGE CONDITION:  Improved/stable.      Kela Millin, M.D.  Electronically Signed     ACV/MEDQ  D:  12/17/2008  T:  12/17/2008  Job:  [id number]   cc:   Molly Maduro A. Nicholos Johns, M.D.  Peter M. [place]il[place], M.D.

## 2010-09-21 NOTE — Cardiovascular Report (Signed)
NAMETYREE, FLUHARTY NO.:  [id number]   MEDICAL RECORD NO.:  [id number]          PATIENT TYPE:  INP   LOCATION:  2920                         FACILITY:  [facility]   PHYSICIAN:  Colleen Can. Deborah Chalk, M.D.DATE OF BIRTH:  Jul 14, 1940   DATE OF PROCEDURE:  12/14/2008  DATE OF DISCHARGE:                            CARDIAC CATHETERIZATION   HISTORY:  Mr. Dolman presents with persistent chest pain over 3 days  duration.  Negative cardiac enzymes and negative CT scan today.  He has  one kidney but has adequate renal function.  He is brought to the cath  lab for evaluation.  1. The right coronary artery was a large dominant vessel.  It had      diffuse irregularities, the proximal portion had a 30 to 40%      narrowing.  It had a 95% ostial stenosis and a high acute marginal      vessel located with this proximal plaque in the right coronary      artery.  There is a large posterior descending and posterior      lateral branch.  There is 50 to 60% narrowing up to the crux.  2. Left main coronary artery is normal.  3. Left circumflex, left circumflex relatively small vessel.  It was      normal.  4. Left anterior descending, left anterior descending had      irregularities and approximately 30 to 50% narrowing in the      midportion.  At the first diagonal vessel, there was 80 to 90%      stenosis proximally with areas of tortuosity.  The second diagonal      vessel had diffuse disease with segmental 95% to 90% stenosis in a      real diffuse manner.  There was a bifurcation and stenosis      involving the more proximal of the two bifurcations in this      diagonal vessel.   The left ventricular angiogram was performed in RAO position.  Overall,  cardiac size and silhouette are normal.  The global ejection fraction  was estimated to be in the 50 to 55% range.  There was ventricular  ectopy, but it appears that all regional wall motion was normal.  The  global ejection fraction  was again in the 55 to 60% range and LV  function is felt to be normal.   The aortic pressure was 131/82, LV pressure of 138/10-19.   OVERALL IMPRESSION:  1. Normal left ventricular function.  2. Moderate to moderately severe two-vessel coronary disease with 95%      stenosis in the small acute marginal branch of the right coronary      artery with 50 to 60% narrowing beyond the crux, and diffuse but      severe disease in two diagonal vessels of the left anterior      descending.   DISCUSSION:  Currently, Mr. Elk is pain free and it is felt that it  is best to manage him medically at this point in time.  It is hard to  know which one of these vessels might be a culprit vessel even if this  might be persistent chest  pain related to angina.  We will try to manage him medically.  The  potential site for angioplasty would involve small vessels at several  locations.  We need to make sure that he truly is having this chest pain  related to myocardial ischemia before proceeding in that direction.      Colleen Can. Deborah Chalk, M.D.  Electronically Signed     SNT/MEDQ  D:  12/14/2008  T:  12/15/2008  Job:  [id number]   cc:   Peter M. [place], M.D.

## 2010-09-24 NOTE — H&P (Signed)
NAME:  Jake Dunn, Jake Dunn NO.:  [id number]   MEDICAL RECORD NO.:  [id number]                   PATIENT TYPE:  EMS   LOCATION:  MAJO                                 FACILITY:  [facility]   PHYSICIAN:  Elmore Guise., M.D.           DATE OF BIRTH:  12/24/1940   DATE OF ADMISSION:  12/22/2003  DATE OF DISCHARGE:                                HISTORY & PHYSICAL   HISTORY OF PRESENT ILLNESS:  A 70 year old white male with a past medical  history of hypertension, renal cell carcinoma, (status post left  nephrectomy), osteoarthritis, (status post lumbar fusion surgery), presented  to primary care physician's office with two week history of complaint of  bilateral arm pain.  On further questioning, the patient reports increasing  chest tightness and arm pain, primarily on the inner aspect of bilateral  arms.  Reports these episodes have been occurring more often with increased  stress, or activity.  He has also noticed some tingling sensation off and on  in his right hand and from the elbow down he has noticed increasing weakness  primarily on the right lower arm.  Today, once he got to his primary  physician's office, he was having chest pain which he describes as moderate  tightness.  EKG was done showing no acute changes.  The patient was sent to  [facility] ER for further evaluation.  Currently, he is chest pain free  without any significant complaints.   REVIEW OF SYSTEMS:  Positive for occasional night sweats, weakness in  bilateral lower extremities, chronic lower back pain and occasional cough,  nonproductive.   CURRENT MEDICATIONS:  1. Accupril 20 mg a day.  2. Plavix 75 mg a day.  3. Neurontin 300 mg t.i.d.  4. Valtrex 500 mg daily.  5. Nabumetone 750 mg b.i.d.  6. He has stopped his labetalol and Procardia.   ALLERGIES:  None.   FAMILY HISTORY:  Positive for coronary disease in his father and brother,  (brother just recently had coronary  artery bypass grafting).   SOCIAL HISTORY:  He is married.  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  He  drinks an occasional beer.   PHYSICAL EXAMINATION:  VITAL SIGNS:  He is afebrile.  Blood pressure is  165/103, heart rate is 96, saturation 98% on room air.  GENERAL:  He is alert and oriented x4.  No acute distress.  NECK:  Supple.  No lymphadenopathy, 2+ carotids.  No JVD.  LUNGS: Clear.  HEART:  Regular with a normal S1 and S2.  No significant murmurs, gallops or  rubs.  ABDOMEN:  Obese, soft, nontender, nondistended with a healed scar in the  left upper quadrant.  EXTREMITIES:  Warm with 2+ pulses and no edema.   LABORATORY WORK:  Shows a white blood cell count of 5.7, hemoglobin 13.9,  platelets of 262,000.  Coag's were normal.  Cardiac enzymes showed  a CPK MB  of less than 1, troponin I less than 0.05 and myoglobin of 86.2.  EKG #1  today shows sinus tachycardia 106 per minute, no significant ST or T wave  changes.  EKG #2 shows normal sinus rhythm, 96 per minute, no significant ST  or T wave changes.   IMPRESSION:  1. Chest pain.  2. Right arm weakness and tingling.   PLAN:  1. Will admit to telemetry bed to rule out myocardial infarction.  Obtain     serial cardiac enzymes and EKG's for chest pain.  The patient to be     started on nitropaste  1 inch to chest wall q.6h., off q.h.s. with     aspirin, Lovenox 1 mg/kg b.i.d., metoprolol 25 mg b.i.d. and continue his     Accupril as before.  If his enzymes remain negative and no further chest     pain, will then obtain stress test for further evaluation.  If they are     positive or further chest pain, will set the patient up for a cardiac     catheterization.  2. Question neuropathic pain.  Questionable cervical compression syndrome.     X-rays of the cervical spine are pending.  If symptoms worsen or     continue, will get neurologic consultation for further evaluation.                                                 Elmore Guise., M.D.    TWK/MEDQ  D:  12/22/2003  T:  12/22/2003  Job:  [id number]

## 2010-09-24 NOTE — Op Note (Signed)
NAMEREDDING, CLOE NO.:  [id number]   MEDICAL RECORD NO.:  [id number]          PATIENT TYPE:  INP   LOCATION:  3021                         FACILITY:  [facility]   PHYSICIAN:  Stefani Dama, M.D.  DATE OF BIRTH:  12/02/1940   DATE OF PROCEDURE:  04/19/2005  DATE OF DISCHARGE:                                 OPERATIVE REPORT   PREOPERATIVE DIAGNOSIS:  Cervical spondylosis C3-4 with right cervical  radiculopathy.   POSTOPERATIVE DIAGNOSIS:  Cervical spondylosis C3-4 with right cervical  radiculopathy.   PROCEDURE:  Anterior cervical decompression C3-C4, structural arthrodesis  with structural allograft of the plate fixation.   SURGEON:  Stefani Dama, M.D.   FIRST ASSISTANT:  Payton Doughty, M.D.   ANESTHESIA:  General endotracheal.   INDICATIONS:  Jake Dunn is a 70 year old individual who underwent  previous anterior decompression, arthrodesis at C5-C6. He has evidence of  spondylitic disease at C3-4 with a right cervical radiculopathy and marked  compromise of the exiting foramen for the C4 nerve root on the right.  He is  advised regarding surgical decompression.   PROCEDURE:  The patient was brought to the operating room, placed on the  table in supine position.  After smooth induction of general tracheal  anesthesia, he was placed in 5 pounds halter traction. The  neck was prepped  with DuraPrep and draped in sterile fashion. A transverse incision was  created in the left side of the neck above his previous incision. This was  carried down through the platysma.  The plane between the  sternocleidomastoid and strap muscles was then identified and soft tissues  on the superior aspect of the strap muscles was dissected down until the  prevertebral space was reached.  The first identifiable disk space was noted  radiographically to be C2-3. Dissection was then carried down inferiorly to  expose C3-4. Longus coli muscle was stripped off either side of  midline and  a self-retaining retractor was placed. The disk space was opened at C3-4 and  significant quantity of severely degenerated disk material was removed from  within the disk space itself. Dissection was carried down to posterior  longitudinal ligament and this ligament was opened with a 1 mm and 2 mm  Kerrison punch.  High-speed drill was used to remove the inferior  osteophytes from the body of C3 to the lateral recess on the right side.  There was a large uncinate spur on the right side. This was drilled down  with high-speed drill also. The lateral recess was opened up and significant  quantity of markedly degenerated disk and bony material was removed from  this area. This allowed for good decompression of the exiting C4 nerve root.  Similar decompression was carried out on the left side, however, there was  not a significant uncinate spur here. The endplates were then ground smooth  and 9 mm transgraft was shaved to the appropriate size and shape and then  filled with the demineralized bone matrix and placed into the interspace.  Traction was removed. Hemostasis in the soft tissue surrounding this  area  was checked and ultimately an 18 mm standard size Alphatec plate was fixed  with locking fixed angle screws in C4 and variable angle screws in C3 that  were 14 mm in length. Final localizing radiograph identified good position  of the surgical construct.  The wound was copiously irrigated with  antibiotic irrigating solution. Hemostasis was meticulously  obtained in the soft tissues and once this was assured, then the platysma  was closed with 3-0 Vicryl in interrupted fashion, 3-0 Vicryl used to close  the subcuticular skin. Dermabond was placed on the skin. The patient  tolerated the procedure well and was returned to recovery room in stable  condition.      Stefani Dama, M.D.  Electronically Signed     HJE/MEDQ  D:  04/19/2005  T:  04/20/2005  Job:  [id number]

## 2010-09-24 NOTE — Discharge Summary (Signed)
NAMEMarland Dunn  JACCOB, CZAPLICKI                          ACCOUNT NO.:  [id number]   MEDICAL RECORD NO.:  [id number]                   PATIENT TYPE:  INP   LOCATION:  6523                                 FACILITY:  [facility]   PHYSICIAN:  Elmore Guise., M.D.           DATE OF BIRTH:  07-Dec-1940   DATE OF ADMISSION:  12/22/2003  DATE OF DISCHARGE:  12/23/2003                                 DISCHARGE SUMMARY   DISCHARGE DIAGNOSES:  1. Chest pain.  2. Hypertension.  3. Degenerative joint disease with mild to moderate cervical stenosis.   HISTORY OF PRESENT ILLNESS:  Jake Dunn is a 70 year old white male with  past medical history of hypertension and degenerative joint disease who  presents for evaluation of chest pain.  The patient was seen at his primary  care physician's office, reported chest pain and arm numbness/tingling over  the last couple of weeks.  Was sent to the emergency room for further  evaluation.   HOSPITAL COURSE:  The patient was admitted to telemetry.  Serial cardiac  enzymes were performed which were negative.  The patient had no significant  chest pain recurrence after starting on nitroglycerin paste.  The patient  underwent Adenosine with limited exercise on December 23, 2003 showing no  significant perfusion defect and an EF of 45-50% (consistent with  hypertensive heart disease).  No wall motion abnormalities were seen.  X-ray  of the cervical spine showed mild to moderate foraminal stenosis on the  right at level C3 and 4 and 5 and 6.  The patient has now been ambulatory  without any further chest pain.  His strength has improved in his right arm.  He is ready for discharge.   DISCHARGE MEDICATIONS:  1. Accupril 20 mg p.o. daily.  2. Plavix 75 mg p.o. daily.  3. Neurontin 300 mg p.o. daily.  4. __________  750 mg p.o. b.i.d.  5. Lorazepam 0.5 mg p.o. q.6h. on a p.r.n. basis.  6. Valtrex 500 mg p.o. daily.  7. Procardia 30 mg p.o. daily.  8. Imdur 60 mg p.o.  daily.  9. Vicodin on a p.r.n. basis.   ACTIVITY:  No restriction.   DIET:  Low salt, low cholesterol.   FOLLOWUP:  He will follow up at [facility] in one to two weeks  with either Dr. Reyes Ivan or Dr. [place].  The patient will notify the office in  the morning for an appointment.  We will also schedule an outpatient MRI for  further evaluation of his cervical stenosis with appropriate follow-up with  neurology and his primary care physician, Dr. Dayton Scrape.  The patient was  discharged home in stable condition under the care of his daughter and wife.  Elmore Guise., M.D.    TWK/MEDQ  D:  12/23/2003  T:  12/24/2003  Job:  [id number]

## 2010-09-24 NOTE — Op Note (Signed)
NAMELONZELL, DORRIS NO.:  [id number]   MEDICAL RECORD NO.:  [id number]          PATIENT TYPE:  AMB   LOCATION:  DSC                          FACILITY:  [facility]   PHYSICIAN:  Loreta Ave, M.D. DATE OF BIRTH:  [birth year]   DATE OF PROCEDURE:  06/29/2005  DATE OF DISCHARGE:                                 OPERATIVE REPORT   PREOPERATIVE DIAGNOSES:  Right shoulder chronic impingement, complete  avulsion, rotator cuff, supraspinatus, infraspinatus, subscapularis tendons.  Chronic long head biceps tendon rupture.  Labral tear.  Degenerative  arthritis acromioclavicular joint.   POSTOPERATIVE DIAGNOSES:  Right shoulder chronic impingement, complete  avulsion, rotator cuff, supraspinatus, infraspinatus, subscapularis tendons.  Chronic long head biceps tendon rupture.  Labral tear.  Degenerative  arthritis acromioclavicular joint.   OPERATION PERFORMED:  Right shoulder examination under anesthesia,  arthroscopy, debridement of glenoid and labrum.  Debridement of the  remaining stump of the biceps tendon.  Debridement of rotator cuff and  assessment for repair.  Acromioplasty, coracoacromial ligament release.  Excision of distal clavicle.  Mini open repair rotator cuff tear with  numerous FiberWire suture repairing supraspinatus, infraspinatus and  subscapularis  through drill holes in the humerus.   SURGEON:  Loreta Ave, M.D.   ASSISTANT:  Genene Churn. Denton Meek.   ANESTHESIA:  General.   ESTIMATED BLOOD LOSS:  Minimal.   SPECIMENS:  None.   CULTURES:  None.   COMPLICATIONS:  None.   DRESSING:  Soft compressive with shoulder immobilizer.   DESCRIPTION OF PROCEDURE:  The patient was brought to the operating room and  after adequate anesthesia had been obtained, the right shoulder was  examined. He had full motion.  Just globally tight throughout but no real  adhesions.  Placed in a beach chair position on the shoulder positioner,  prepped and draped  in the usual sterile fashion.  Three standard  arthroscopic portals, anterior, posterior and lateral.  Shoulder entered  with blunt obturator, arthroscope, distended and inspected.  The humeral  head did not look bad.  Some grade 2 changes throughout, most marked grade 3  changes anterior inferior glenoid debrided.  Complex degenerative tearing of  the labrum, especially anterior inferior debrided.  Really not an  instability pattern.  What was left of the stump of the biceps debrided.  The other end was retracted and tenodesis or reattachment not an option at  all.  The entire cuff torn off, retracted about 2 cm.  Delamination tearing  within the cuff acute on chronic.  Despite the degree of retraction, this  was still relatively mobile and I could bring it almost to its attachment  site and it was felt repair was reasonable.  Looking above and below, the  cuff was debrided.  Type 3 acromion.  Acromioplasty to a type 1 acromial  chamber and high speed bur, releasing coracoacromial ligament with cautery.  Distal clavicle grade 4 changes, marked periarticular spurs.  Periarticular  spurs and the lateral centimeter of clavicle resected.  Adequacy of  decompression and clavicle excision confirmed viewing from all portals.  Instruments and  fluid removed.  Deltoid splitting incisions through lateral  portal.  Subacromial space accessed.  Adequacy of decompression confirmed.  Cuff was digitally mobilized.  It was then captured with 4 weave #2  FiberWire sutures.  I used this to really capture and be able to mobilize  the cuff as well as to pull together the delamination tearing within the  cuff.  Able to bring the cuff just about to the attachment site with this  technique.  A series of drill holes were made from the bicipital groove in  the front all the way back to half way down the infraspinatus in the back.  Sutures were all weaved through numerous drill holes.  Arm was abducted.  Sutures  pulled tightly and tied over a bony bridge laterally.  This gave a  nice firm watertight closure of the cuff throughout.  A little bit of  interval tearing in the subscap supraspinatus interval was repaired with a  running Ethibond that was then brought down and tied to the FiberWire  laterally.  At completion despite the amount of tearing and retraction, I  was actually pleased we were getting a nice firm watertight closure even  with the arm at the side.  Wound irrigated.  Deltoid closed with Vicryl.  Skin with subcutaneous and subcuticular Vicryl.  Portals closed with 4-0  nylon.  Shoulder injected with Marcaine.  Sterile compressive dressing  applied.  Anesthesia reversed.  Brought to recovery room.  Tolerated surgery  well without complication.      Loreta Ave, M.D.  Electronically Signed     DFM/MEDQ  D:  06/29/2005  T:  06/30/2005  Job:  [id number]

## 2010-09-24 NOTE — Procedures (Signed)
[place]  Patient:    Jake Dunn, Jake Dunn                       MRN: [id number] Proc. Date: 09/06/99 Adm. Date:  [id number] Attending:  Orland Mustard CC:         Peter M. Swazil[place], M.D.             Meredith Staggers, M.D.                           Procedure Report  PROCEDURE PERFORMED:  Colonoscopy with polypectomy.  ENDOSCOPIST:  Llana Aliment. Randa Evens, M.D.  MEDICATIONS USED:  Fentanyl 100 mcg, Versed 12.5 mg IV.  INDICATIONS:  The patient is a nice 70 year old gentleman with rectal bleeding, strongly positive family history of colon cancer.  His brother had colon cancer.  Other siblings have had colon polyps.  DESCRIPTION OF PROCEDURE:  The procedure had been explained to the patient and consent obtained.  With the patient in the left lateral decubitus position, the Olympus adult video colonoscope was inserted and advanced under direct visualization.  The prep was excellent and we were able to advance to the cecum.  The ileocecal valve and appendiceal orifice were seen.  The scope was withdrawn.  The cecum, ascending colon, hepatic flexure, transverse colon, splenic flexure, descending and sigmoid colon were seen well.  In the middescending colon a 0.75 cm polyp was removed through the snare and sucked through the scope.  In the sigmoid colon there were three polyps, each approximately 0.5 cm in diameter, all removed with a snare and sucked through the scope and placed in jar #2.  Internal hemorrhoids were seen in the rectum. Scope withdrawn, patient tolerated the procedure well.  ASSESSMENT: 1. Multiple polyps removed. 2. Internal hemorrhoids.  PLAN:  Will check path and recommend repeating in two years if adenomatous. DD:  09/06/99 TD:  09/06/99 Job: [id number]

## 2010-09-24 NOTE — Op Note (Signed)
[name], Jake Dunn NO.:  [id number]   MEDICAL RECORD NO.:  [id number]          PATIENT TYPE:  AMB   LOCATION:  DSC                          FACILITY:  [facility]   PHYSICIAN:  Loreta Ave, M.D. DATE OF BIRTH:  March 26, 1941   DATE OF PROCEDURE:  08/11/2005  DATE OF DISCHARGE:  08/11/2005                                 OPERATIVE REPORT   PREOPERATIVE DIAGNOSIS:  Status post rotator cuff repair, right shoulder,  with superficial infection and drainage and wound breakdown.   POSTOPERATIVE DIAGNOSIS:  Right shoulder, status post rotator cuff repair,  with drainage from superficial wound extending through deltoid to deep  wound.  Negative cultures.  Evidence of nonhealing of rotator cuff repair  with recurrent tear.   PROCEDURES:  1.  Right shoulder examination under anesthesia with open exploration.  2.  Thorough irrigation and debridement.  3.  Debridement of rotator cuff and re-repair with Fibrewire suture.   SURGEON:  Loreta Ave, M.D.   ASSISTANT:  Genene Churn. Denton Meek.   ANESTHESIA:  General.   ESTIMATED BLOOD LOSS:  Minimal.   SPECIMENS:  None.   CULTURES:  None.   COMPLICATIONS:  None.   DRESSING:  Soft compressive with sling.   PROCEDURE:  The patient was brought to the operating room and after adequate  anesthesia had been obtained, right shoulder examined.  The previous area of  drainage, which had now settled down a great deal, had shown negative  organisms on Gram stain and negative cultures.  The wound, which had an area  of opening in the midportion in the deltoid-splitting wound, was  elliptically excised.  This yielded a scant amount of serous fluid but no  purulence and no other debris.  This was opened, exposing the subcutaneous  space.  Again, no obvious purulence.  There was, however, a fistulous tract  going through the deltoid which allowed access to subacromial space.  Because of the extension down that area, the deltoid  was reopened to allow  further irrigation and debridement.  The rotator cuff was evident and  sutures all still in place through the tuberosity; however, the cuff itself  had pulled away leaving a gap of about 1.5 cm between the edge of the cuff  and the area it had been repaired to with a watertight closure.  This  obviously allowed access to this draining material all the way down to the  shoulder.  These sutures were removed as the repair was not intact and  watertight.  I then did a thorough irrigation with 6 L of saline of the  subcutaneous tissue, subacromial space and shoulder joint.  Once I was  fairly convinced there was no active infection, I recaptured the rotator  cuff with numerous Fibrewire sutures.  This cuff tissue was in very poor  condition from a chronic tear at the time of the primary repair and the  condition of the tissue really no better or worse.  This was again mobilized  without dissecting into new areas.  After capturing with Fibrewire suture, I  then re-repaired  it to the humerus utilizing the same previous drill holes.  I again got a nice, firm watertight closure but again I am extremely  concerned that the quality of this tissue will hold up over time.  The  deltoid was loosely reapproximated and then the skin loosely reapproximated  with nylon.  This allowed egress and drainage if necessary but still allowed  good side-to-side closure to allow healing.  A sterile compressive dressing  applied.  A sling with an abduction pillow applied.  Anesthesia reversed.  Brought to the recovery room.  Tolerated the surgery well with no  complications.      Loreta Ave, M.D.  Electronically Signed     DFM/MEDQ  D:  08/15/2005  T:  08/16/2005  Job:  [id number]

## 2010-09-24 NOTE — Op Note (Signed)
NAMEMARSALIS, BEAULIEU                ACCOUNT NO.:  [id number]   MEDICAL RECORD NO.:  [id number]          PATIENT TYPE:  INP   LOCATION:  3172                         FACILITY:  [facility]   PHYSICIAN:  Stefani Dama, M.D.  DATE OF BIRTH:  [birth year]   DATE OF PROCEDURE:  02/26/2004  DATE OF DISCHARGE:                                 OPERATIVE REPORT   PREOPERATIVE DIAGNOSIS:  Cervical spondylosis with myelopathy, C5-6 and C6-  7.   POSTOPERATIVE DIAGNOSIS:  Cervical spondylosis with myelopathy, C5-6 and C6-  7.   PROCEDURE:  Anterior cervical decompression C5-6 and C6-7, arthrodesis with  structural allograft and Alphatek plate fixation.   SURGEON:  Stefani Dama, M.D.   FIRST ASSISTANT:  Cristi Loron, M.D.   ANESTHESIA:  General endotracheal.   INDICATIONS FOR PROCEDURE:  Mr. 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  He has  evidence of severe spondylitic cord compression at 5-6 and 6-7.   DESCRIPTION OF PROCEDURE:  The patient was brought to the operating room,  placed on the table in the supine position.  After the smooth induction of  general endotracheal anesthesia, he was placed in 5 pounds of Holter  traction, neck was prepped with DuraPrep and draped in a sterile fashion.  Transverse incision was made in the mid-portion of his neck and the  dissection was carried down through the platysma, to the plane between the  sternocleidomastoid muscle and the strap muscles was dissected bluntly until  the prevertebral space was reached.   The first identifiable disk space was noted to be that of C3-4 and  localizing radiograph dissection was carried inferiorly to expose C5-6 and  then C6-7. The longus colli muscle was stripped back to allow placement of  the Caspar blade retractor.  Diskectomy at C5-6 was then undertaken by  removing  large ventral osteophytes with a rongeur.  Disk space was then  entered and a significant quantity of severely degenerated disk material was  removed from within the disk space.  Posterior longitudinal ligament area  was then identified.  There was noted to be a large hypertrophied bone spur  from the inferior margin of the body of C5, and this was drilled down with a  high speed air drill with 2.3 mm dissecting tool.  Dissection was carried  out to the side to expose the exit foramen.  Uncinate spur was drilled down  similarly.   The posterior longitudinal ligament was then taken up with a 2 mm Kerrison  punch. This was done from left to right.  Epidural venous bleeding was  encountered, and this was controlled with bipolar cautery and some small  pledgets of Gelfoam soaked in thrombin, which were later irrigated away.  Dissection was ultimately carried out to either side.   Once the interspace was cleared, then it was fitted for an 8 mm Cortec graft  that was corticocancellous.  The same procedure was then carried out at C6-  7.  Here, uncinate spurs were larger. There was a smaller, bony spur from  the inferior margin of the body of C6 and this was drilled down.  Once this  area was decompressed and hemostasis was achieved in a similar fashion;  again, an 8 mm Cortec graft was placed into the interspace.  Traction was  removed. The front of the vertebrae were then fitted with a 37 mm standard  size Alphatek plate with locking variable screws in C5, 6 screws in C6 and  C7.  Final localizing radiograph only identified the top screws in C5, as  the patient's bulk did not allow visualization radiographically of C6 and  C7.   Hemostasis in the soft tissues was obtained meticulously.  Once this was  ascertained, the platysma was closed with 3-0 Vicryl interrupted fashion, 3-  0 Vicryl was used to close the subcuticular tissues. Dermabond was placed on  the skin.  The patient tolerated the  procedure well, was returned to the  recovery room in stable condition.       HJE/MEDQ  D:  02/26/2004  T:  02/26/2004  Job:  [id number]

## 2010-09-24 NOTE — Op Note (Signed)
Jake Dunn, Jake Dunn                ACCOUNT NO.:  [id number]   MEDICAL RECORD NO.:  [id number]          PATIENT TYPE:  AMB   LOCATION:  ENDO                         FACILITY:  [facility]   PHYSICIAN:  James L. Malon Kindle., M.D.DATE OF BIRTH:  1940/06/24   DATE OF PROCEDURE:  11/18/2004  DATE OF DISCHARGE:                                 OPERATIVE REPORT   PROCEDURE PERFORMED:  Colonoscopy.   ENDOSCOPIST:  Llana Aliment. Edwards, M.D.   MEDICATIONS:  Fentanyl 125 mcg, Versed 12 mg IV.   INSTRUMENT USED:  Olympus pediatric adjustable colonoscope.   INDICATIONS FOR PROCEDURE:  The patient has a history of multiple colon  polyps. This is done as a follow-up.   DESCRIPTION OF PROCEDURE:  The procedure had been explained to the patient  and consent obtained.  With the patient in the left lateral decubitus  position, the Olympus colonoscope was inserted and advanced under direct  visualization.  Prep quite good.  We were able to reach the cecum. The  ileocecal valve and appendiceal orifice were seen.  The scope was withdrawn,  colon carefully examined, no polyps seen throughout.  No diverticular  disease, normal mucosa.  The scope was withdrawn.  The patient tolerated the  procedure well.   ASSESSMENT:  History of colon polyps with negative colonoscopy at this time,  V12.72.   PLAN:  Recommend yearly Hemoccults, repeat colonoscopy in five years.       JLE/MEDQ  D:  11/18/2004  T:  11/18/2004  Job:  [id number]   cc:   Molly Maduro A. Nicholos Johns, M.D.  [address]  [place]  [place] [address]  Fax: [phone]

## 2010-09-24 NOTE — Op Note (Signed)
NAMESTEFON, RAMTHUN                ACCOUNT NO.:  [id number]   MEDICAL RECORD NO.:  [id number]          PATIENT TYPE:  AMB   LOCATION:  SDS                          FACILITY:  [facility]   PHYSICIAN:  Stefani Dama, M.D.  DATE OF BIRTH:  [birth year]   DATE OF PROCEDURE:  12/05/2005  DATE OF DISCHARGE:                                 OPERATIVE REPORT   PREOPERATIVE DIAGNOSIS:  Right carpal tunnel syndrome.   POSTOPERATIVE DIAGNOSIS:  Right carpal tunnel syndrome.   PROCEDURE:  Right carpal tunnel release.   SURGEON:  Dr. Danielle Dess.   FIRST ASSISTANT:  None.   ANESTHESIA:  Monitored anesthesia with local.   INDICATIONS:  Ordean Fouts is a 70 year old individual, who has had a number  of problems with spondylitic disease in both the lumbar and cervical spine.  He has undergone previous decompression of cervical spine for spondylitic  myelopathy and he has residual symptoms of severe numbness and pain in the  right hand and forearm.  He was found to have carpal tunnel syndrome and has  a positive Tinel's sign in that right wrist and is now taken to the  operating room to undergo surgical decompression.  He also has carpal tunnel  syndrome in the left upper extremity, however, the right is clinically more  severe.   PROCEDURE:  The patient was brought to the operating room and after the  establishment of some modest sedation his right hand was prepped using  DuraPrep and then a stockinette drape was used to drape out the region of  the right hand.  The area of the wrist crease was exposed and the area was  marked from the wrist crease in the midline to the area of the fourth ray.  This line was then infiltrated with lidocaine with epinephrine mixed 50/50  with 1/2% Marcaine for total of 5 mL.  Once good analgesia was established,  a linear incision was made along the line from the mid portion of the wrist  crease in line with the fourth ray for a distance of approximately 1 inch.  Dissection was taken through the superficial fascia.  Transcarpal ligament  was identified.  This was then sectioned in a piecemeal fashion being very  careful to protect the underlying structures.  Once the transcarpal ligament  was broken through, there was noted to be a significantly blanched median  nerve immediately underneath the surface. The median nerve was protected and  sectioning of the transcarpal ligament was continued into the palmar arch  until the entirety of the ligament was opened and some fat around the nerve  could be identified.  More proximally in the area underneath the skin  incision was dissected posteriorly on the ulnar aspect of the transcarpal  ligament until the entirety of the transcarpal ligament was dissected.  Once  this was completed, the nerve itself was inspected.  It was noted to be  returning to pinkish color.  Hemostasis in the soft tissues was then  obtained and the skin was closed with interrupted vertical mattress sutures  of 4-0 nylon.  A dry,  fluffy dressing was applied.  The patient tolerated  procedure well and was returned to recovery room in stable condition.      Stefani Dama, M.D.  Electronically Signed     HJE/MEDQ  D:  12/05/2005  T:  12/05/2005  Job:  [id number]

## 2010-09-24 NOTE — Op Note (Signed)
   NAMEMarland Dunn  MONROE, QIN                          ACCOUNT NO.:  [id number]   MEDICAL RECORD NO.:  [id number]                   PATIENT TYPE:  AMB   LOCATION:  ENDO                                 [facility]ACILITY[facility]   PHYSICIAN:  James L. Malon Kindle., M.D.          DATE OF BIRTH:  Jun 07, 1940   DATE OF PROCEDURE:  09/10/2002  DATE OF DISCHARGE:                                 OPERATIVE REPORT   PROCEDURE:  Colonoscopy and polypectomy.   MEDICATIONS:  Fentanyl 125 mcg, Versed 10 mg IV.   INDICATIONS FOR PROCEDURE:  A patient with a history of rectal bleeding. His  brother and other family members had colon cancer as well as polyps. The  patient himself has had colon polyps. This was done as a followup. Multiple  polyps removed at his last colonoscopy.   DESCRIPTION OF PROCEDURE:  The procedure had been explained to the patient  and consent obtained. With the patient in the left lateral decubitus  position, the Olympus scope was inserted. The prep was excellent. Using some  abdominal pressure, we were able to reach the cecum easily. The pediatric  scope was used. In the cecum, two polyps were found each approximately 3/4  to 1 cm and were sessile, both were removed with on bleeding in sight  through the scope and placed in a single jar. No other polyps were seen in  the cecum. The ascending and transverse colon were seen well. In the mid  descending colon, a 3/4 cm polyp was seen and removed with the snare and  sucked through the scope. No polyps were seen in the sigmoid or rectum. The  scope was withdrawn. The patient tolerated the procedure well.   ASSESSMENT:  Sessile polyps in the cecum and descending colon removed.   PLAN:  Will recommend repeating procedure in two years.                                               James L. Malon Kindle., M.D.    Waldron Session  D:  09/10/2002  T:  09/10/2002  Job:  [id number]   cc:   Meredith Staggers, M.D.  [address], [address]  [place]  [place] [address]  Fax: [phone]

## 2011-01-28 LAB — CBC
MCHC: 34.2
RBC: 4.84
WBC: 8.3

## 2011-01-28 LAB — BASIC METABOLIC PANEL
Calcium: 9.7
Creatinine, Ser: 0.91
GFR calc Af Amer: >60
GFR calc non Af Amer: >60

## 2011-02-08 ENCOUNTER — Other Ambulatory Visit: Payer: Self-pay | Admitting: Family Medicine

## 2011-02-08 DIAGNOSIS — M549 Dorsalgia, unspecified: Secondary | ICD-10-CM

## 2011-02-28 ENCOUNTER — Ambulatory Visit
Admission: RE | Admit: 2011-02-28 | Discharge: 2011-02-28 | Disposition: A | Discharge status: Home / Self Care | Payer: Medicare Other | Source: Ambulatory Visit | Attending: Family Medicine | Admitting: Family Medicine

## 2011-02-28 DIAGNOSIS — M549 Dorsalgia, unspecified: Secondary | ICD-10-CM

## 2011-05-25 ENCOUNTER — Encounter (HOSPITAL_COMMUNITY): Payer: Self-pay | Admitting: Pharmacy Technician

## 2011-06-01 ENCOUNTER — Encounter: Payer: Self-pay | Admitting: Nurse Practitioner

## 2011-06-01 ENCOUNTER — Encounter: Payer: Self-pay | Admitting: *Deleted

## 2011-06-01 ENCOUNTER — Ambulatory Visit (INDEPENDENT_AMBULATORY_CARE_PROVIDER_SITE_OTHER): Payer: Medicare Other | Admitting: Nurse Practitioner

## 2011-06-01 VITALS — BP 158/90 | HR 69 | Ht 67.0 in | Wt 235.0 lb

## 2011-06-01 DIAGNOSIS — Z01818 Encounter for other preprocedural examination: Secondary | ICD-10-CM

## 2011-06-01 DIAGNOSIS — I2510 Atherosclerotic heart disease of native coronary artery without angina pectoris: Secondary | ICD-10-CM

## 2011-06-01 NOTE — Progress Notes (Signed)
Jake Dunn Date of Birth: [birth year] Medical Record #[id number]  History of Present Illness: Jake Dunn is seen today for a work in visit. He is seen for Dr. Swaziland. He has known CAD with last stress test in 2011. He continues to be managed medically. He has been on chronic Plavix. He is planning on having left TKR next week with Dr. Eulah Pont. He has been cleared medically.  He comes in today. He is doing well. 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 He does not have any chest pain. He is not short of breath. He has had no passing out spells. He says his blood pressure at home is good. He does not have any history of CHF or valvular heart disease.   Current Outpatient Prescriptions on File Prior to Visit  Medication Sig Dispense Refill  . acyclovir (ZOVIRAX) 400 MG tablet Take 400 mg by mouth as needed.      Marland Kitchen allopurinol (ZYLOPRIM) 300 MG tablet Take 300 mg by mouth daily.      Marland Kitchen LORazepam (ATIVAN) 0.5 MG tablet Take 0.5 mg by mouth 2 (two) times daily.      . metoprolol (LOPRESSOR) 50 MG tablet Take 50 mg by mouth 2 (two) times daily.      . pantoprazole (PROTONIX) 40 MG tablet Take 40 mg by mouth daily.      . rosuvastatin (CRESTOR) 40 MG tablet Take 40 mg by mouth daily.      . clopidogrel (PLAVIX) 75 MG tablet Take 75 mg by mouth daily.        Allergies  Allergen Reactions  . Adhesive (Tape) Rash    Paper tape okay  . Latex Rash    Past Medical History  Diagnosis Date  . Hypertension   . CAD (coronary artery disease)     s/p cath in August 2010 showing significant stenosis in the diagonal side branches and in the RV marginal branch. These vessels were small and diffusely diseased. He is managed medically.   . Gout   . GERD (gastroesophageal reflux disease)   . Osteoarthritis   . Renal cell carcinoma   . Adenomatous polyp   . Disc disease, degenerative, cervical   . Syncope and collapse   . Hyperlipidemia   . Anxiety   . History of back surgery      Past Surgical History  Procedure Date  . Carpal tunnel release   . Lumbar fusion   . Nephrectomy   . Replacement total knee   . Cardiac catheterization 12/14/2008    ef 50-55%. SHOWED SIGNIFICANT STENOSIS AND TO DIAGONAL SIDE BRANCHES AND IN THE RIGHT VENTRICULAR MARGINAL BRANCH. THESE VESSELS WERE SMALL AND DIFFUSELY DISEASED  . Polypectomy   . Back surgery   . US echocardiography 12/15/2008    EF 60-65%  . US echocardiography 09/28/2004    EF 55-60%  . Cardiovascular stress test 12/10/2009    EF 61%. EKG negative. Mild peri ischemia. Managed medically.     History  Smoking status  . Former Smoker  . Quit date: 05/31/1989  Smokeless tobacco  . Not on file    History  Alcohol Use  . Yes    Family History  Problem Relation Age of Onset  . Heart attack Father   . Heart disease Brother     Review of Systems: The review of systems is positive for knee pain but he is able to remain active.  All other systems were reviewed and are negative.  Physical Exam: BP 158/90  Pulse 69  Ht 5\' 7"  (1.702 m)  Wt 235 lb (106.595 kg)  BMI 36.81 kg/m2 Patient is very pleasant and in no acute distress. Skin is warm and dry. Color is normal.  HEENT is unremarkable. Normocephalic/atraumatic. PERRL. Sclera are nonicteric. Neck is supple. No masses. No JVD. Lungs are clear. Cardiac exam shows a regular rate and rhythm. Abdomen is soft. Extremities are without edema. Gait and ROM are intact. No gross neurologic deficits noted.   LABORATORY DATA: EKG today shows sinus rhythm and is normal. Tracing is reviewed with Dr. Swaziland.    Assessment / Plan:

## 2011-06-01 NOTE — Assessment & Plan Note (Signed)
He presents for preoperative clearance. He does have known CAD but is doing well clinically. No signs of CHF. He does not have valvular disease. He is quite active with good exercise tolerance. His case is reviewed with Dr. Swaziland and Mr. Mall is felt to be a satisfactory candidate for TKR. He is off of his Plavix for one week and is to resume post op. Patient is agreeable to this plan and will call if any problems develop in the interim.

## 2011-06-01 NOTE — Patient Instructions (Signed)
I think you are doing well.   You are at an acceptable risk for upcoming knee surgery.  We will see you back at your regular appointment time.  Call the [facility] office at [phone] if you have any questions, problems or concerns.

## 2011-06-01 NOTE — H&P (Signed)
  [facility] [address]   [address] [place], [place] [address] [phone] A Division of [facility]  Loreta Ave, M.D.     Robert A. Thurston Hole, M.D.     Lunette Stands, M.D. Eulas Post, M.D.    Buford Dresser, M.D. Estell Harpin, M.D. Ralene Cork, D.O.          Genene Churn. Barry Dienes, PA-C            Kirstin A. Shepperson, PA-C [place], OPA-C   RE: Kellar, [place]                                [phone]      DOB: [birth year] PROGRESS NOTE: 05-27-11 Chief complaint: Left knee pain.  History of present illness: 21 eight year-old white male with a history of end stage DJD, left knee, and chronic pain.  Returns.  States that left knee symptoms are unchanged from previous visit.  He is wanting to proceed with total knee replacement that is scheduled.  We did receive pre-op medical clearance from Dr. Laurann Montana, but she recommended preoperative cardiac clearance with Dr. [name], but he states that he did not go for this.  Stated that he was under the impression that he was fully cleared.   Current medications: Plavix, Vitamin B6, Flonase, Pantoprazole, Crestor, Toprol, Nifedipine, Celebrex, Vicodin, Colcrys, Acyclovir, Allopurinol and Lorazepam. Allergies: Latex (rash).  Past medical/surgical history: Hypertension, renal cell carcinoma, coronary artery disease, gout, anxiety, cold sore, acid reflux, adenomatous polyps, cervical degenerative disc disease, right total knee replacement, bilateral carpal tunnel surgery, lumbar fusion and left nephrectomy in 1995.   Review of systems: Denies fevers, chills, lightheadedness, dizziness, GI, GU, cardiac or pulmonary issues.    Family history: Positive for coronary artery disease, breast cancer and pancreatic cancer. Social history: He quit smoking in 1980.  Admits occasional alcohol use.  Patient is married.  EXAMINATION: Height: 5?7.  Weight: 236 pounds.  Blood  pressure: 147/83.  Pulse: 72.  Temperature: 98.3.  Pleasant white male, alert and oriented x 3 and in no acute distress.  Gait is antalgic.  Head is normocephalic, a traumatic.  PERRLA, EOMI.  Cervical spine unremarkable.  Lungs: CTA bilaterally.  No wheezes.  Heart: RRR.  S1 and S2.  No murmurs.  Abdomen: Round and non-distended.  NBS x 4.  Soft and non-tender.  Left knee: Decreased range of motion.  Positive crepitus.  1-2+ effusion.  Joint line tender.  Ligaments stable.  Calf non-tender.  Neurovascularly intact.  Skin warm and dry.   IMPRESSION: 1. End stage DJD, left knee, and chronic pain.  Failed conservative treatment.   2. Coronary artery disease.  PLAN:  Advised patient that he needs to get official cardiac clearance from Dr. [name].  Advised that Dr. Cliffton Asters only cleared him from a medical standpoint.  States that he will be in contact with Dr. Elvis Coil office today.  We will proceed with pre-op workup as scheduled, but will await clearance.    Loreta Ave, M.D.   Electronically verified by Loreta Ave, M.D. DFM(JMO):jjh Cc: Dr. Laurann Montana, fax: [phone]  Cc: Dr. [name], fax: [phone] D 05-30-11 T 05-30-11

## 2011-06-02 ENCOUNTER — Encounter (HOSPITAL_COMMUNITY)
Admission: RE | Admit: 2011-06-02 | Discharge: 2011-06-02 | Disposition: A | Discharge status: Home / Self Care | Payer: Medicare Other | Source: Ambulatory Visit | Attending: Orthopedic Surgery | Admitting: Orthopedic Surgery

## 2011-06-02 ENCOUNTER — Encounter (HOSPITAL_COMMUNITY): Payer: Self-pay

## 2011-06-02 ENCOUNTER — Encounter (HOSPITAL_COMMUNITY)
Admission: RE | Admit: 2011-06-02 | Discharge: 2011-06-02 | Disposition: A | Discharge status: Home / Self Care | Payer: Medicare Other | Source: Ambulatory Visit | Attending: Surgery | Admitting: Surgery

## 2011-06-02 HISTORY — DX: Cerebral infarction, unspecified: I63.9

## 2011-06-02 LAB — COMPREHENSIVE METABOLIC PANEL
CO2: 20 mEq/L (ref 19–32)
Calcium: 9.4 mg/dL (ref 8.4–10.5)
Creatinine, Ser: 0.99 mg/dL (ref 0.50–1.35)
GFR calc Af Amer: >90 mL/min (ref >90)
GFR calc non Af Amer: 82 mL/min — ABNORMAL LOW (ref >90)
Glucose, Bld: 92 mg/dL (ref 70–99)
Total Protein: 7.5 g/dL (ref 6.0–8.3)

## 2011-06-02 LAB — URINALYSIS, ROUTINE W REFLEX MICROSCOPIC
Glucose, UA: NEGATIVE mg/dL
Leukocytes, UA: NEGATIVE
Nitrite: NEGATIVE
Protein, ur: NEGATIVE mg/dL

## 2011-06-02 LAB — CBC
Hemoglobin: 14.5 g/dL (ref 13.0–17.0)
MCH: 29.9 pg (ref 26.0–34.0)
MCHC: 32.6 g/dL (ref 30.0–36.0)
MCV: 91.8 fL (ref 78.0–100.0)
RBC: 4.85 MIL/uL (ref 4.22–5.81)

## 2011-06-02 LAB — TYPE AND SCREEN: ABO/RH(D): A POS

## 2011-06-02 LAB — PROTIME-INR
INR: 0.98 (ref 0.00–1.49)
Prothrombin Time: 13.2 seconds (ref 11.6–15.2)

## 2011-06-02 LAB — APTT: aPTT: 33 seconds (ref 24–37)

## 2011-06-02 NOTE — Pre-Procedure Instructions (Signed)
20 [address]  06/02/2011   Your procedure is scheduled on:   Wednesday 06/08/11   Report to [facility] at 800 AM.  Call this number if you have problems the morning of surgery: [phone]   Remember:   Do not eat food:After Midnight.  May have clear liquids: up to 4 Hours before arrival.  Clear liquids include soda, tea, black coffee, apple or grape juice, broth.  Take these medicines the morning of surgery with A SIP OF WATER:  ALLOPURINOL, ATIVAN, LOPRESSOR(METOPROLOL), PROTONIX    Do not wear jewelry, make-up or nail polish.  Do not wear lotions, powders, or perfumes. You may wear deodorant.  Do not shave 48 hours prior to surgery.  Do not bring valuables to the hospital.  Contacts, dentures or bridgework may not be worn into surgery.  Leave suitcase in the car. After surgery it may be brought to your room.  For patients admitted to the hospital, checkout time is 11:00 AM the day of discharge.   Patients discharged the day of surgery will not be allowed to drive home.  Name and phone number of your driver:   Special Instructions: CHG Shower Use Special Wash: 1/2 bottle night before surgery and 1/2 bottle morning of surgery.   Please read over the following fact sheets that you were given: Pain Booklet, Total Joint Packet, MRSA Information and Surgical Site Infection Prevention

## 2011-06-07 MED ORDER — CEFAZOLIN SODIUM-DEXTROSE 2-3 GM-% IV SOLR
2.0000 g | INTRAVENOUS | Status: AC
  Administered 2011-06-08: 2 g via INTRAVENOUS
  Filled 2011-06-07: qty 50

## 2011-06-08 ENCOUNTER — Encounter (HOSPITAL_COMMUNITY): Payer: Self-pay | Admitting: Anesthesiology

## 2011-06-08 ENCOUNTER — Encounter (HOSPITAL_COMMUNITY)
Admission: RE | Disposition: A | Discharge status: Home Health | Payer: Self-pay | Source: Ambulatory Visit | Attending: Orthopedic Surgery

## 2011-06-08 ENCOUNTER — Encounter (HOSPITAL_COMMUNITY): Payer: Self-pay | Admitting: *Deleted

## 2011-06-08 ENCOUNTER — Ambulatory Visit (HOSPITAL_COMMUNITY): Payer: Medicare Other

## 2011-06-08 ENCOUNTER — Ambulatory Visit (HOSPITAL_COMMUNITY): Payer: Medicare Other | Admitting: Anesthesiology

## 2011-06-08 ENCOUNTER — Inpatient Hospital Stay (HOSPITAL_COMMUNITY)
Admission: RE | Admit: 2011-06-08 | Discharge: 2011-06-10 | DRG: 470 | Disposition: A | Discharge status: Home Health | Payer: Medicare Other | Source: Ambulatory Visit | Attending: Orthopedic Surgery | Admitting: Orthopedic Surgery

## 2011-06-08 DIAGNOSIS — F411 Generalized anxiety disorder: Secondary | ICD-10-CM | POA: Diagnosis present

## 2011-06-08 DIAGNOSIS — Z7902 Long term (current) use of antithrombotics/antiplatelets: Secondary | ICD-10-CM

## 2011-06-08 DIAGNOSIS — Z471 Aftercare following joint replacement surgery: Secondary | ICD-10-CM

## 2011-06-08 DIAGNOSIS — K219 Gastro-esophageal reflux disease without esophagitis: Secondary | ICD-10-CM | POA: Diagnosis present

## 2011-06-08 DIAGNOSIS — Z85528 Personal history of other malignant neoplasm of kidney: Secondary | ICD-10-CM

## 2011-06-08 DIAGNOSIS — Z905 Acquired absence of kidney: Secondary | ICD-10-CM

## 2011-06-08 DIAGNOSIS — Z800 Family history of malignant neoplasm of digestive organs: Secondary | ICD-10-CM

## 2011-06-08 DIAGNOSIS — Z87891 Personal history of nicotine dependence: Secondary | ICD-10-CM

## 2011-06-08 DIAGNOSIS — Z96659 Presence of unspecified artificial knee joint: Secondary | ICD-10-CM

## 2011-06-08 DIAGNOSIS — M109 Gout, unspecified: Secondary | ICD-10-CM | POA: Diagnosis present

## 2011-06-08 DIAGNOSIS — I10 Essential (primary) hypertension: Secondary | ICD-10-CM | POA: Diagnosis present

## 2011-06-08 DIAGNOSIS — Z803 Family history of malignant neoplasm of breast: Secondary | ICD-10-CM

## 2011-06-08 DIAGNOSIS — Z8673 Personal history of transient ischemic attack (TIA), and cerebral infarction without residual deficits: Secondary | ICD-10-CM

## 2011-06-08 DIAGNOSIS — Z91040 Latex allergy status: Secondary | ICD-10-CM

## 2011-06-08 DIAGNOSIS — M1710 Unilateral primary osteoarthritis, unspecified knee: Principal | ICD-10-CM | POA: Diagnosis present

## 2011-06-08 DIAGNOSIS — I2510 Atherosclerotic heart disease of native coronary artery without angina pectoris: Secondary | ICD-10-CM | POA: Diagnosis present

## 2011-06-08 DIAGNOSIS — Z01818 Encounter for other preprocedural examination: Secondary | ICD-10-CM

## 2011-06-08 HISTORY — PX: TOTAL KNEE ARTHROPLASTY: SHX125

## 2011-06-08 LAB — BASIC METABOLIC PANEL
BUN: 14 mg/dL (ref 6–23)
GFR calc Af Amer: >90 mL/min (ref >90)
GFR calc non Af Amer: 83 mL/min — ABNORMAL LOW (ref >90)
Potassium: 4.4 mEq/L (ref 3.5–5.1)
Sodium: 136 mEq/L (ref 135–145)

## 2011-06-08 SURGERY — ARTHROPLASTY, KNEE, TOTAL
Anesthesia: General | Site: Knee | Laterality: Left | Wound class: Clean

## 2011-06-08 MED ORDER — ONDANSETRON HCL 4 MG/2ML IJ SOLN
4.0000 mg | Freq: Four times a day (QID) | INTRAMUSCULAR | Status: DC | PRN
  Filled 2011-06-08: qty 2

## 2011-06-08 MED ORDER — ENOXAPARIN SODIUM 30 MG/0.3ML ~~LOC~~ SOLN
30.0000 mg | Freq: Two times a day (BID) | SUBCUTANEOUS | Status: DC
  Administered 2011-06-09 – 2011-06-10 (×2): 30 mg via SUBCUTANEOUS
  Filled 2011-06-08 (×4): qty 0.3

## 2011-06-08 MED ORDER — ONDANSETRON HCL 4 MG/2ML IJ SOLN
INTRAMUSCULAR | Status: DC | PRN
  Administered 2011-06-08: 4 mg via INTRAVENOUS

## 2011-06-08 MED ORDER — DIPHENHYDRAMINE HCL 12.5 MG/5ML PO ELIX
12.5000 mg | ORAL_SOLUTION | Freq: Four times a day (QID) | ORAL | Status: DC | PRN
  Filled 2011-06-08: qty 5

## 2011-06-08 MED ORDER — BUPIVACAINE HCL (PF) 0.25 % IJ SOLN
INTRAMUSCULAR | Status: DC | PRN
  Administered 2011-06-08: 30 mL

## 2011-06-08 MED ORDER — MEPERIDINE HCL 25 MG/ML IJ SOLN: 6.2500 mg | INTRAMUSCULAR | Status: DC | PRN

## 2011-06-08 MED ORDER — METOCLOPRAMIDE HCL 10 MG PO TABS
5.0000 mg | ORAL_TABLET | Freq: Three times a day (TID) | ORAL | Status: DC | PRN
  Administered 2011-06-08: 5 mg via ORAL
  Filled 2011-06-08: qty 1

## 2011-06-08 MED ORDER — METHOCARBAMOL 100 MG/ML IJ SOLN
500.0000 mg | Freq: Four times a day (QID) | INTRAVENOUS | Status: DC | PRN
  Filled 2011-06-08: qty 5

## 2011-06-08 MED ORDER — ALLOPURINOL 300 MG PO TABS
300.0000 mg | ORAL_TABLET | Freq: Every day | ORAL | Status: DC
  Administered 2011-06-09 – 2011-06-10 (×2): 300 mg via ORAL
  Filled 2011-06-08 (×3): qty 1

## 2011-06-08 MED ORDER — FENTANYL CITRATE 0.05 MG/ML IJ SOLN
INTRAMUSCULAR | Status: DC | PRN
  Administered 2011-06-08: 50 ug via INTRAVENOUS
  Administered 2011-06-08: 100 ug via INTRAVENOUS
  Administered 2011-06-08 (×2): 50 ug via INTRAVENOUS

## 2011-06-08 MED ORDER — NALOXONE HCL 0.4 MG/ML IJ SOLN: 0.4000 mg | INTRAMUSCULAR | Status: DC | PRN

## 2011-06-08 MED ORDER — FENTANYL CITRATE 0.05 MG/ML IJ SOLN: 50.0000 ug | INTRAMUSCULAR | Status: DC | PRN

## 2011-06-08 MED ORDER — DOCUSATE SODIUM 100 MG PO CAPS
100.0000 mg | ORAL_CAPSULE | Freq: Two times a day (BID) | ORAL | Status: DC
  Administered 2011-06-08 – 2011-06-10 (×4): 100 mg via ORAL
  Filled 2011-06-08 (×5): qty 1

## 2011-06-08 MED ORDER — NEOSTIGMINE METHYLSULFATE 1 MG/ML IJ SOLN
INTRAMUSCULAR | Status: DC | PRN
  Administered 2011-06-08: 3 mg via INTRAVENOUS

## 2011-06-08 MED ORDER — FLEET ENEMA 7-19 GM/118ML RE ENEM: 1.0000 | ENEMA | Freq: Once | RECTAL | Status: AC | PRN

## 2011-06-08 MED ORDER — MORPHINE SULFATE (PF) 1 MG/ML IV SOLN
INTRAVENOUS | Status: DC
  Administered 2011-06-08: 3 mg via INTRAVENOUS
  Administered 2011-06-08: 6 mg via INTRAVENOUS
  Administered 2011-06-09: 03:00:00 via INTRAVENOUS
  Administered 2011-06-09: 7 mg via INTRAVENOUS
  Administered 2011-06-09: 10.39 mg via INTRAVENOUS
  Filled 2011-06-08: qty 25

## 2011-06-08 MED ORDER — ROCURONIUM BROMIDE 100 MG/10ML IV SOLN
INTRAVENOUS | Status: DC | PRN
  Administered 2011-06-08: 20 mg via INTRAVENOUS
  Administered 2011-06-08: 40 mg via INTRAVENOUS
  Administered 2011-06-08: 10 mg via INTRAVENOUS

## 2011-06-08 MED ORDER — ROSUVASTATIN CALCIUM 40 MG PO TABS
40.0000 mg | ORAL_TABLET | Freq: Every day | ORAL | Status: DC
  Administered 2011-06-09 – 2011-06-10 (×2): 40 mg via ORAL
  Filled 2011-06-08 (×3): qty 1

## 2011-06-08 MED ORDER — LACTATED RINGERS IV SOLN
INTRAVENOUS | Status: DC | PRN
  Administered 2011-06-08 (×2): via INTRAVENOUS

## 2011-06-08 MED ORDER — SODIUM CHLORIDE 0.9 % IJ SOLN: 9.0000 mL | INTRAMUSCULAR | Status: DC | PRN

## 2011-06-08 MED ORDER — CEFAZOLIN SODIUM 1-5 GM-% IV SOLN
1.0000 g | Freq: Four times a day (QID) | INTRAVENOUS | Status: AC
  Administered 2011-06-08 – 2011-06-09 (×3): 1 g via INTRAVENOUS
  Filled 2011-06-08 (×3): qty 50

## 2011-06-08 MED ORDER — OXYCODONE-ACETAMINOPHEN 5-325 MG PO TABS
1.0000 | ORAL_TABLET | ORAL | Status: DC | PRN
  Administered 2011-06-09 – 2011-06-10 (×5): 2 via ORAL
  Filled 2011-06-08 (×5): qty 2

## 2011-06-08 MED ORDER — ACETAMINOPHEN 650 MG RE SUPP: 650.0000 mg | Freq: Four times a day (QID) | RECTAL | Status: DC | PRN

## 2011-06-08 MED ORDER — DIPHENHYDRAMINE HCL 50 MG/ML IJ SOLN: 12.5000 mg | Freq: Four times a day (QID) | INTRAMUSCULAR | Status: DC | PRN

## 2011-06-08 MED ORDER — METHOCARBAMOL 500 MG PO TABS
500.0000 mg | ORAL_TABLET | Freq: Four times a day (QID) | ORAL | Status: DC | PRN
  Administered 2011-06-08 – 2011-06-10 (×2): 500 mg via ORAL
  Filled 2011-06-08 (×2): qty 1

## 2011-06-08 MED ORDER — MIDAZOLAM HCL 5 MG/5ML IJ SOLN
INTRAMUSCULAR | Status: DC | PRN
  Administered 2011-06-08 (×2): 1 mg via INTRAVENOUS

## 2011-06-08 MED ORDER — MIDAZOLAM HCL 2 MG/2ML IJ SOLN: 1.0000 mg | INTRAMUSCULAR | Status: DC | PRN

## 2011-06-08 MED ORDER — ONDANSETRON HCL 4 MG PO TABS: 4.0000 mg | ORAL_TABLET | Freq: Four times a day (QID) | ORAL | Status: DC | PRN

## 2011-06-08 MED ORDER — METOPROLOL TARTRATE 50 MG PO TABS
50.0000 mg | ORAL_TABLET | Freq: Two times a day (BID) | ORAL | Status: DC
  Administered 2011-06-09 – 2011-06-10 (×3): 50 mg via ORAL
  Filled 2011-06-08 (×5): qty 1

## 2011-06-08 MED ORDER — PHENOL 1.4 % MT LIQD
1.0000 | OROMUCOSAL | Status: DC | PRN
  Filled 2011-06-08: qty 177

## 2011-06-08 MED ORDER — WARFARIN VIDEO: Freq: Once | Status: DC

## 2011-06-08 MED ORDER — LORAZEPAM 0.5 MG PO TABS
0.5000 mg | ORAL_TABLET | Freq: Two times a day (BID) | ORAL | Status: DC
  Administered 2011-06-08 – 2011-06-10 (×5): 0.5 mg via ORAL
  Filled 2011-06-08 (×5): qty 1

## 2011-06-08 MED ORDER — ACYCLOVIR 400 MG PO TABS
400.0000 mg | ORAL_TABLET | Freq: Every day | ORAL | Status: DC | PRN
  Filled 2011-06-08: qty 1

## 2011-06-08 MED ORDER — MENTHOL 3 MG MT LOZG: 1.0000 | LOZENGE | OROMUCOSAL | Status: DC | PRN

## 2011-06-08 MED ORDER — LACTATED RINGERS IV SOLN
INTRAVENOUS | Status: DC
  Administered 2011-06-08: 09:00:00 via INTRAVENOUS

## 2011-06-08 MED ORDER — HYDROMORPHONE HCL PF 1 MG/ML IJ SOLN
0.2500 mg | INTRAMUSCULAR | Status: DC | PRN
  Administered 2011-06-08 (×2): 0.5 mg via INTRAVENOUS

## 2011-06-08 MED ORDER — 0.9 % SODIUM CHLORIDE (POUR BTL) OPTIME
TOPICAL | Status: DC | PRN
  Administered 2011-06-08: 1000 mL

## 2011-06-08 MED ORDER — PANTOPRAZOLE SODIUM 40 MG PO TBEC
40.0000 mg | DELAYED_RELEASE_TABLET | Freq: Every day | ORAL | Status: DC
  Administered 2011-06-09 – 2011-06-10 (×2): 40 mg via ORAL
  Filled 2011-06-08 (×3): qty 1

## 2011-06-08 MED ORDER — MORPHINE SULFATE (PF) 1 MG/ML IV SOLN
INTRAVENOUS | Status: DC
  Administered 2011-06-08: 13:00:00 via INTRAVENOUS
  Filled 2011-06-08: qty 25

## 2011-06-08 MED ORDER — GLYCOPYRROLATE 0.2 MG/ML IJ SOLN
INTRAMUSCULAR | Status: DC | PRN
  Administered 2011-06-08: .4 mg via INTRAVENOUS

## 2011-06-08 MED ORDER — ONDANSETRON HCL 4 MG/2ML IJ SOLN
4.0000 mg | Freq: Four times a day (QID) | INTRAMUSCULAR | Status: DC | PRN
  Administered 2011-06-08 – 2011-06-09 (×2): 4 mg via INTRAVENOUS
  Filled 2011-06-08: qty 2

## 2011-06-08 MED ORDER — NIFEDIPINE ER 60 MG PO TB24
60.0000 mg | ORAL_TABLET | Freq: Every day | ORAL | Status: DC
  Filled 2011-06-08 (×3): qty 1

## 2011-06-08 MED ORDER — ACETAMINOPHEN 325 MG PO TABS
650.0000 mg | ORAL_TABLET | Freq: Four times a day (QID) | ORAL | Status: DC | PRN
  Administered 2011-06-09: 650 mg via ORAL
  Filled 2011-06-08: qty 2

## 2011-06-08 MED ORDER — POTASSIUM CHLORIDE IN NACL 20-0.9 MEQ/L-% IV SOLN
INTRAVENOUS | Status: DC
  Administered 2011-06-08 – 2011-06-09 (×3): via INTRAVENOUS
  Filled 2011-06-08 (×8): qty 1000

## 2011-06-08 MED ORDER — MORPHINE SULFATE 4 MG/ML IJ SOLN
INTRAMUSCULAR | Status: DC | PRN
  Administered 2011-06-08: 4 mg via INTRAVENOUS

## 2011-06-08 MED ORDER — EPHEDRINE SULFATE 50 MG/ML IJ SOLN
INTRAMUSCULAR | Status: DC | PRN
  Administered 2011-06-08: 5 mg via INTRAVENOUS

## 2011-06-08 MED ORDER — CHLORHEXIDINE GLUCONATE 4 % EX LIQD: 60.0000 mL | Freq: Once | CUTANEOUS | Status: DC

## 2011-06-08 MED ORDER — DEXTROSE 5 % IV SOLN
INTRAVENOUS | Status: DC | PRN
  Administered 2011-06-08: 10:00:00 via INTRAVENOUS

## 2011-06-08 MED ORDER — COUMADIN BOOK
Freq: Once | Status: AC
  Administered 2011-06-08: 20:00:00
  Filled 2011-06-08: qty 1

## 2011-06-08 MED ORDER — PROPOFOL 10 MG/ML IV EMUL
INTRAVENOUS | Status: DC | PRN
  Administered 2011-06-08: 20 mg via INTRAVENOUS
  Administered 2011-06-08: 180 mg via INTRAVENOUS

## 2011-06-08 MED ORDER — WARFARIN SODIUM 7.5 MG PO TABS
7.5000 mg | ORAL_TABLET | Freq: Once | ORAL | Status: AC
  Administered 2011-06-08: 7.5 mg via ORAL
  Filled 2011-06-08: qty 1

## 2011-06-08 MED ORDER — PROMETHAZINE HCL 25 MG/ML IJ SOLN: 6.2500 mg | INTRAMUSCULAR | Status: DC | PRN

## 2011-06-08 SURGICAL SUPPLY — 58 items
BANDAGE ESMARK 6X9 LF (GAUZE/BANDAGES/DRESSINGS) ×1 IMPLANT
BLADE SAG 18X100X1.27 (BLADE) ×4 IMPLANT
BNDG CMPR 9X6 STRL LF SNTH (GAUZE/BANDAGES/DRESSINGS) ×1
BNDG ESMARK 6X9 LF (GAUZE/BANDAGES/DRESSINGS) ×2
BOOTCOVER CLEANROOM LRG (PROTECTIVE WEAR) ×4 IMPLANT
BOWL SMART MIX CTS (DISPOSABLE) ×2 IMPLANT
CATH FOLEY LATEX FREE 16FR (CATHETERS) ×2
CATH FOLEY LF 16FR (CATHETERS) IMPLANT
CEMENT BONE SIMPLEX SPEEDSET (Cement) ×4 IMPLANT
CLOTH BEACON ORANGE TIMEOUT ST (SAFETY) ×2 IMPLANT
COVER BACK TABLE 24X17X13 BIG (DRAPES) IMPLANT
COVER SURGICAL LIGHT HANDLE (MISCELLANEOUS) ×2 IMPLANT
CUFF TOURNIQUET SINGLE 34IN LL (TOURNIQUET CUFF) ×2 IMPLANT
DRAPE EXTREMITY T 121X128X90 (DRAPE) ×2 IMPLANT
DRAPE PROXIMA HALF (DRAPES) ×2 IMPLANT
DRAPE U-SHAPE 47X51 STRL (DRAPES) ×2 IMPLANT
DRSG PAD ABDOMINAL 8X10 ST (GAUZE/BANDAGES/DRESSINGS) ×2 IMPLANT
DURAPREP 26ML APPLICATOR (WOUND CARE) ×2 IMPLANT
ELECT CAUTERY BLADE 6.4 (BLADE) ×2 IMPLANT
ELECT REM PT RETURN 9FT ADLT (ELECTROSURGICAL) ×2
ELECTRODE REM PT RTRN 9FT ADLT (ELECTROSURGICAL) ×1 IMPLANT
EVACUATOR 1/8 PVC DRAIN (DRAIN) ×2 IMPLANT
FACESHIELD LNG OPTICON STERILE (SAFETY) ×2 IMPLANT
GAUZE XEROFORM 5X9 LF (GAUZE/BANDAGES/DRESSINGS) ×2 IMPLANT
GLOVE BIOGEL PI IND STRL 8 (GLOVE) ×1 IMPLANT
GLOVE BIOGEL PI INDICATOR 8 (GLOVE)
GLOVE SURG SS PI 7.5 STRL IVOR (GLOVE) ×5 IMPLANT
GOWN PREVENTION PLUS XLARGE (GOWN DISPOSABLE) ×4 IMPLANT
GOWN STRL NON-REIN LRG LVL3 (GOWN DISPOSABLE) ×4 IMPLANT
GOWN STRL REIN 2XL XLG LVL4 (GOWN DISPOSABLE) ×2 IMPLANT
HANDPIECE INTERPULSE COAX TIP (DISPOSABLE) ×2
IMMOBILIZER KNEE 22 UNIV (SOFTGOODS) ×2 IMPLANT
IMMOBILIZER KNEE 24 THIGH 36 (MISCELLANEOUS) IMPLANT
IMMOBILIZER KNEE 24 UNIV (MISCELLANEOUS)
KIT BASIN OR (CUSTOM PROCEDURE TRAY) ×2 IMPLANT
KIT ROOM TURNOVER OR (KITS) ×2 IMPLANT
MANIFOLD NEPTUNE II (INSTRUMENTS) ×2 IMPLANT
NS IRRIG 1000ML POUR BTL (IV SOLUTION) ×2 IMPLANT
PACK TOTAL JOINT (CUSTOM PROCEDURE TRAY) ×2 IMPLANT
PAD ARMBOARD 7.5X6 YLW CONV (MISCELLANEOUS) ×4 IMPLANT
PAD CAST 4YDX4 CTTN HI CHSV (CAST SUPPLIES) ×1 IMPLANT
PADDING CAST COTTON 4X4 STRL (CAST SUPPLIES)
PADDING CAST COTTON 6X4 STRL (CAST SUPPLIES) ×3 IMPLANT
RUBBERBAND STERILE (MISCELLANEOUS) ×2 IMPLANT
SET HNDPC FAN SPRY TIP SCT (DISPOSABLE) ×1 IMPLANT
SPONGE GAUZE 4X4 12PLY (GAUZE/BANDAGES/DRESSINGS) ×2 IMPLANT
STAPLER VISISTAT 35W (STAPLE) ×2 IMPLANT
SUCTION FRAZIER TIP 10 FR DISP (SUCTIONS) ×2 IMPLANT
SUT VIC AB 1 CTX 36 (SUTURE) ×4
SUT VIC AB 1 CTX36XBRD ANBCTR (SUTURE) ×2 IMPLANT
SUT VIC AB 2-0 CT1 27 (SUTURE) ×4
SUT VIC AB 2-0 CT1 TAPERPNT 27 (SUTURE) ×2 IMPLANT
SYR 30ML LL (SYRINGE) ×2 IMPLANT
SYR 30ML SLIP (SYRINGE) ×2 IMPLANT
TOWEL OR 17X24 6PK STRL BLUE (TOWEL DISPOSABLE) ×2 IMPLANT
TOWEL OR 17X26 10 PK STRL BLUE (TOWEL DISPOSABLE) ×2 IMPLANT
TRAY FOLEY CATH 14FR (SET/KITS/TRAYS/PACK) ×1 IMPLANT
WATER STERILE IRR 1000ML POUR (IV SOLUTION) ×6 IMPLANT

## 2011-06-08 NOTE — Brief Op Note (Signed)
06/08/2011  11:51 AM  PATIENT:  Jake Dunn  71 y.o. male  PRE-OPERATIVE DIAGNOSIS:  DJD LEFT KNEE  POST-OPERATIVE DIAGNOSIS:  Degenerative Joint Disease Left Knee  PROCEDURE:  Procedure(s): Left TOTAL KNEE ARTHROPLASTY  SURGEON:  Surgeon(s): Loreta Ave, MD  PHYSICIAN ASSISTANT: Zonia Kief M  ANESTHESIA:   general  EBL:  Total I/O In: 1050 [I.V.:1050] Out: 200 [Urine:150; Blood:50]  BLOOD ADMINISTERED:none  SPECIMEN: none DISPOSITION OF SPECIMEN:  N/A  COUNTS:  YES  TOURNIQUET:   Total Tourniquet Time Documented: Thigh (Left) - 68 minutes   PATIENT DISPOSITION:  PACU - hemodynamically stable.

## 2011-06-08 NOTE — Op Note (Signed)
NAMERANSON, BELLUOMINI NO.:  [id number]  MEDICAL RECORD NO.:  [id number]  LOCATION:  5018                         FACILITY:  [facility]  PHYSICIAN:  Loreta Ave, M.D. DATE OF BIRTH:  09/06/1940  DATE OF PROCEDURE:  06/08/2011 DATE OF DISCHARGE:                              OPERATIVE REPORT   PREOPERATIVE DIAGNOSIS:  Left knee end-stage degenerative arthritis, varus alignment.  POSTOPERATIVE DIAGNOSIS:  Left knee end-stage degenerative arthritis, varus alignment.  PROCEDURE:  Modified minimally invasive left total knee replacement with Stryker Triathlon prosthesis.  Soft tissue balancing.  Cemented pegged posterior stabilized #6 femoral component.  Cemented #6 tibial component, 9 mm polyethylene insert.  Cemented resurfacing 35 mm patellar component.  SURGEON:  Loreta Ave, MD  ASSISTANT:  Zonia Kief, PA, present throughout the entire case, necessary for timely completion of procedure.  ANESTHESIA:  General.  BLOOD LOSS:  Minimal.  SPECIMENS:  None.  CULTURES:  None.  COMPLICATION:  None.  DRESSINGS:  Soft compressive knee immobilizer.  TOURNIQUET TIME:  1 hour.  DRAINS:  Hemovac x1.  PROCEDURE:  The patient was brought to the operating room and placed on the operating room table in supine position.  After adequate anesthesia had been obtained, left knee examined.  Varus alignment correctable past neutral.  Minimal flexion and contracture.  Marked grading crepitus patellofemoral joint.  Flexion 100 degrees.  Tourniquet applied, prepped and draped in usual sterile fashion.  Exsanguinated with elevation of Esmarch.  Tourniquet inflated to 350 mmHg.  Straight incision above the patella down to tibial tubercle.  Medial arthrotomy and then vastus splitting at the top preserving quad tendon.  Knee exposed.  Grade 4 change throughout the patellofemoral joint as well as in the medial compartment, lesser extent laterally.  Medial capsule release.   Remnants of menisci, cruciate ligaments, spurs, loose bodies removed.  Distal femur exposed.  Intramedullary guide placed.  A 10 mm resection, 5 degrees of valgus.  Using epicondylar axis, sized, cut, and fitted for a posterior stabilized #6 component.  Proximal tibial resection, extramedullary guide.  A 3-degree posterior slope cut.  Size #6 component well.  Debris cleared in all recesses throughout.  Knee was nicely balanced in flexion and extension.  Patella exposed.  Posterior 10 mm removed.  A lot of bony defect laterally.  Drilled, sized, and fitted for a 35-mm component.  After clearing the knee throughout, trials put in place, #6 above and below, 9 mm insert, 35 patella.  With this construct, nicely balanced in flexion and extension.  Good alignment, good stability, good mechanical axis, good patellar tracking. Tibia was marked for rotation and hand reamed.  All trials removed. Copious irrigation with pulse irrigating device.  Cement prepared and placed in all components, firmly seated.  Polyethylene attached to tibia, knee reduced.  Patella held with a clamp.  Once cement was hardened, the knee was reexamined, again pleased with alignment, stability, tracking.  Hemovac placed through a separate stab wound. Knee irrigated.  Arthrotomy was closed with 1 Vicryl.  Skin and subcutaneous tissue with Vicryl and staples.  Sterile compressive dressing applied.  Tourniquet deflated and removed.  Knee immobilizer applied.  Anesthesia reversed.  Brought to the room.  Tolerated surgery well.  No complications.     Loreta Ave, M.D.     DFM/MEDQ  D:  06/08/2011  T:  06/08/2011  Job:  [id number]

## 2011-06-08 NOTE — Anesthesia Postprocedure Evaluation (Signed)
  Anesthesia Post-op Note  Patient: Jake Dunn  Procedure(s) Performed:  TOTAL KNEE ARTHROPLASTY  Patient Location: PACU  Anesthesia Type: GA combined with regional for post-op pain  Level of Consciousness: awake  Airway and Oxygen Therapy: Patient Spontanous Breathing  Post-op Pain: mild  Post-op Assessment: Post-op Vital signs reviewed  Post-op Vital Signs: stable  Complications: No apparent anesthesia complications

## 2011-06-08 NOTE — Progress Notes (Addendum)
ANTICOAGULATION CONSULT NOTE - Initial Consult  Pharmacy Consult for Coumadin Indication: VTE prophylaxis  Allergies  Allergen Reactions  . Adhesive (Tape) Rash    Paper tape okay  . Latex Rash    Vital Signs: Temp: 97.8 F (36.6 C) (01/30 1318) Temp src: Oral (01/30 0810) BP: 131/74 mmHg (01/30 1309) Pulse Rate: 65  (01/30 1318)  Labs: No results found for this basename: HGB:2,HCT:3,PLT:3,APTT:3,LABPROT:3,INR:3,HEPARINUNFRC:3,CREATININE:3,CKTOTAL:3,CKMB:3,TROPONINI:3 in the last 72 hours The CrCl is unknown because both a height and weight (above a minimum accepted value) are required for this calculation.  Medical History: Past Medical History  Diagnosis Date  . Hypertension   . CAD (coronary artery disease)     s/p cath in August 2010 showing significant stenosis in the diagonal side branches and in the RV marginal branch. These vessels were small and diffusely diseased. He is managed medically.   . Gout   . GERD (gastroesophageal reflux disease)   . Osteoarthritis   . Renal cell carcinoma   . Adenomatous polyp   . Disc disease, degenerative, cervical   . Syncope and collapse   . Hyperlipidemia   . Anxiety   . History of back surgery   . Stroke     MINI STROKE 15+ YRS AGO     Medications:  Prescriptions prior to admission  Medication Sig Dispense Refill  . acyclovir (ZOVIRAX) 400 MG tablet Take 400 mg by mouth as needed.      Marland Kitchen allopurinol (ZYLOPRIM) 300 MG tablet Take 300 mg by mouth daily.      . clopidogrel (PLAVIX) 75 MG tablet Take 75 mg by mouth daily.      Marland Kitchen LORazepam (ATIVAN) 0.5 MG tablet Take 0.5 mg by mouth 2 (two) times daily.      . metoprolol (LOPRESSOR) 50 MG tablet Take 50 mg by mouth 2 (two) times daily.      . pantoprazole (PROTONIX) 40 MG tablet Take 40 mg by mouth daily.      . rosuvastatin (CRESTOR) 40 MG tablet Take 40 mg by mouth daily.        Assessment: 70yom to start Coumadin for VTE prophylaxis s/p TKA. Pt reports no anticoagulants  on Med Rec. Pt is also receiving Lovenox 30mg  q12h - noted orders to discontinue once INR >/= 1.8 - Baseline INR 0.98, AST/ALT wnl - No bleeding complications reported - Warfarin points : 5  Goal of Therapy:  INR 2-3   Plan:  1. Coumadin 7.5mg  po x 1 today 2. Daily PT/INR 3. Coumadin educational material  Cleon Dew [phone] 06/08/2011,2:41 PM

## 2011-06-08 NOTE — Interval H&P Note (Signed)
History and Physical Interval Note:  06/08/2011 8:12 AM  Jake Dunn  has presented today for surgery, with the diagnosis of DJD LEFT KNEE  The various methods of treatment have been discussed with the patient and family. After consideration of risks, benefits and other options for treatment, the patient has consented to  Procedure(s): TOTAL KNEE ARTHROPLASTY as a surgical intervention .  The patients' history has been reviewed, patient examined, no change in status, stable for surgery.  I have reviewed the patients' chart and labs.  Questions were answered to the patient's satisfaction.     MURPHY,DANIEL F

## 2011-06-08 NOTE — Anesthesia Procedure Notes (Addendum)
Procedure Name: Intubation Date/Time: 06/08/2011 10:04 AM Performed by: Neomia Dear, RANDEL K Pre-anesthesia Checklist: Patient identified, Timeout performed, Emergency Drugs available, Suction available and Patient being monitored Patient Re-evaluated:Patient Re-evaluated prior to inductionOxygen Delivery Method: Circle System Utilized Preoxygenation: Pre-oxygenation with 100% oxygen Intubation Type: IV induction Ventilation: Mask ventilation without difficulty Laryngoscope Size: Mac and 3 Grade View: Grade II Tube type: Oral Tube size: 8.0 mm Number of attempts: 1 Airway Equipment and Method: stylet Placement Confirmation: ETT inserted through vocal cords under direct vision,  positive ETCO2 and breath sounds checked- equal and bilateral Secured at: 24 cm Tube secured with: Tape Dental Injury: Teeth and Oropharynx as per pre-operative assessment    Anesthesia Regional Block:  Femoral nerve block  Pre-Anesthetic Checklist: ,, timeout performed, Correct Patient, Correct Site, Correct Laterality, Correct Procedure, Correct Position, site marked, Risks and benefits discussed,  Surgical consent,  Pre-op evaluation,  At surgeon's request and post-op pain management  Laterality: Left  Prep: Maximum Sterile Barrier Precautions used and chloraprep       Needles:   Needle Type: Echogenic Stimulator Needle          Additional Needles: Femoral nerve block  Nerve Stimulator or Paresthesia:  Response: 0.5 mA,   Additional Responses:   Narrative:  Start time: 06/08/2011 8:00 AM End time: 06/08/2011 8:15 AM Injection made incrementally with aspirations every 5 mL.  Performed by: Personally  Anesthesiologist: Edwards  Femoral nerve block

## 2011-06-08 NOTE — Transfer of Care (Signed)
Immediate Anesthesia Transfer of Care Note  Patient: Jake Dunn  Procedure(s) Performed:  TOTAL KNEE ARTHROPLASTY  Patient Location: PACU  Anesthesia Type: GA combined with regional for post-op pain  Level of Consciousness: awake, alert  and oriented  Airway & Oxygen Therapy: Patient Spontanous Breathing and Patient connected to nasal cannula oxygen  Post-op Assessment: Report given to PACU RN, Post -op Vital signs reviewed and stable and Patient moving all extremities  Post vital signs: Reviewed and stable  Complications: No apparent anesthesia complications

## 2011-06-08 NOTE — Anesthesia Preprocedure Evaluation (Addendum)
Anesthesia Evaluation  Patient identified by MRN, date of birth, ID band  Reviewed: Allergy & Precautions, H&P , NPO status , Patient's Chart, lab work & pertinent test results, reviewed documented beta blocker date and time   Airway       Dental   Pulmonary          Cardiovascular hypertension, Pt. on medications and Pt. on home beta blockers + CAD     Neuro/Psych Anxiety CVA    GI/Hepatic Neg liver ROS, GERD-  Medicated and Controlled,  Endo/Other  Negative Endocrine ROS  Renal/GU Renal diseaseHistory of Renal Cell CA S/P Left nephrectomy     Musculoskeletal   Abdominal   Peds  Hematology negative hematology ROS (+)   Anesthesia Other Findings   Reproductive/Obstetrics                          Anesthesia Physical Anesthesia Plan  ASA: III  Anesthesia Plan: General   Post-op Pain Management: MAC Combined w/ Regional for Post-op pain   Induction: Intravenous  Airway Management Planned: Oral ETT  Additional Equipment:   Intra-op Plan:   Post-operative Plan: Extubation in OR  Informed Consent: I have reviewed the patients History and Physical, chart, labs and discussed the procedure including the risks, benefits and alternatives for the proposed anesthesia with the patient or authorized representative who has indicated his/her understanding and acceptance.   Dental advisory given  Plan Discussed with: Anesthesiologist and Surgeon  Anesthesia Plan Comments:         Anesthesia Quick Evaluation

## 2011-06-08 NOTE — Preoperative (Signed)
Beta Blockers   Reason not to administer Beta Blockers:Not Applicable 

## 2011-06-09 ENCOUNTER — Encounter (HOSPITAL_COMMUNITY): Payer: Self-pay | Admitting: Orthopedic Surgery

## 2011-06-09 LAB — CBC
MCH: 31 pg (ref 26.0–34.0)
MCHC: 32.9 g/dL (ref 30.0–36.0)
Platelets: 173 10*3/uL (ref 150–400)
RDW: 14.6 % (ref 11.5–15.5)

## 2011-06-09 LAB — BASIC METABOLIC PANEL
Calcium: 8.6 mg/dL (ref 8.4–10.5)
GFR calc Af Amer: 87 mL/min — ABNORMAL LOW (ref >90)
GFR calc non Af Amer: 75 mL/min — ABNORMAL LOW (ref >90)
Glucose, Bld: 132 mg/dL — ABNORMAL HIGH (ref 70–99)
Sodium: 135 mEq/L (ref 135–145)

## 2011-06-09 LAB — PROTIME-INR
INR: 1.13 (ref 0.00–1.49)
Prothrombin Time: 14.7 seconds (ref 11.6–15.2)

## 2011-06-09 MED ORDER — WARFARIN SODIUM 7.5 MG PO TABS
7.5000 mg | ORAL_TABLET | Freq: Once | ORAL | Status: AC
  Administered 2011-06-09: 7.5 mg via ORAL
  Filled 2011-06-09: qty 1

## 2011-06-09 MED ORDER — ACYCLOVIR 400 MG PO TABS
400.0000 mg | ORAL_TABLET | Freq: Three times a day (TID) | ORAL | Status: DC | PRN
  Filled 2011-06-09: qty 1

## 2011-06-09 MED FILL — Morphine Sulfate Inj 4 MG/ML: INTRAMUSCULAR | Qty: 1 | Status: AC

## 2011-06-09 NOTE — Evaluation (Signed)
Occupational Therapy Evaluation Patient Details Name: LANEY BAGSHAW MRN: [id number] DOB: 25-Apr-1941 Today's Date: 06/09/2011  Problem List:  Patient Active Problem List  Diagnoses  . Pre-operative clearance    Past Medical History:  Past Medical History  Diagnosis Date  . Hypertension   . CAD (coronary artery disease)     s/p cath in August 2010 showing significant stenosis in the diagonal side branches and in the RV marginal branch. These vessels were small and diffusely diseased. He is managed medically.   . Gout   . GERD (gastroesophageal reflux disease)   . Osteoarthritis   . Renal cell carcinoma   . Adenomatous polyp   . Disc disease, degenerative, cervical   . Syncope and collapse   . Hyperlipidemia   . Anxiety   . History of back surgery   . Stroke     MINI STROKE 15+ YRS AGO    Past Surgical History:  Past Surgical History  Procedure Date  . Carpal tunnel release   . Lumbar fusion   . Nephrectomy   . Replacement total knee   . Cardiac catheterization 12/14/2008    ef 50-55%. SHOWED SIGNIFICANT STENOSIS AND TO DIAGONAL SIDE BRANCHES AND IN THE RIGHT VENTRICULAR MARGINAL BRANCH. THESE VESSELS WERE SMALL AND DIFFUSELY DISEASED  . Polypectomy   . Back surgery   . US echocardiography 12/15/2008    EF 60-65%  . US echocardiography 09/28/2004    EF 55-60%  . Cardiovascular stress test 12/10/2009    EF 61%. EKG negative. Mild peri ischemia. Managed medically.   . Cervical spine surgery     5 YRS AGO  . Joint replacement     RIGHT SHOULDER  . Total knee arthroplasty 06/08/2011    Procedure: TOTAL KNEE ARTHROPLASTY;  Surgeon: Loreta Ave, MD;  Location: [facility] OR;  Service: Orthopedics;  Laterality: Left;    OT Assessment/Plan/Recommendation OT Assessment Clinical Impression Statement: This 71 y.o. male admitted for Lt. TKA.  Pt reports wife will assist with lower body ADLs until he is able to access lt. foot.  Pt. has all needed DME, and no further OT issues.  Will  sign off OT Recommendation/Assessment: Patient does not need any further OT services OT Recommendation Follow Up Recommendations: No OT follow up Equipment Recommended: None recommended by OT OT Goals    OT Evaluation Precautions/Restrictions  Precautions Precautions: Knee Required Braces or Orthoses: Yes Knee Immobilizer: Discontinue post op day 2 Restrictions Weight Bearing Restrictions: Yes LLE Weight Bearing: Weight bearing as tolerated Prior Functioning Home Living Lives With: Spouse Receives Help From: Family Type of Home: House Home Layout: One level Home Access: Stairs to enter Entrance Stairs-Rails: Can reach both Entrance Stairs-Number of Steps: 3 Bathroom Shower/Tub: Health visitor: Handicapped height Bathroom Accessibility: Yes How Accessible: Accessible via walker Home Adaptive Equipment: Bedside commode/3-in-1;Walker - rolling Additional Comments: Wife is available to assist with ADLs as needed Prior Function Level of Independence: Independent with basic ADLs;Independent with homemaking with ambulation;Independent with gait;Independent with transfers Able to Take Stairs?: Yes Driving: Yes Vocation: Retired ADL ADL Eating/Feeding: Simulated;Independent Where Assessed - Eating/Feeding: Chair Grooming: Simulated;Wash/dry hands;Wash/dry face;Teeth care;Supervision/safety Where Assessed - Grooming: Standing at sink Upper Body Bathing: Simulated;Set up Where Assessed - Upper Body Bathing: Sit to stand from chair Lower Body Bathing: Simulated;Moderate assistance Where Assessed - Lower Body Bathing: Sit to stand from chair Upper Body Dressing: Simulated;Set up Where Assessed - Upper Body Dressing: Unsupported;Sitting, chair Lower Body Dressing: Simulated;Moderate assistance Where Assessed -  Lower Body Dressing: Sit to stand from chair Toilet Transfer: Simulated;Supervision/safety Toilet Transfer Method: Proofreader:  Raised toilet seat with arms (or 3-in-1 over toilet) Toileting - Clothing Manipulation: Simulated;Supervision/safety Where Assessed - Toileting Clothing Manipulation: Standing Toileting - Hygiene: Simulated;Independent Where Assessed - Toileting Hygiene: Sit on 3-in-1 or toilet Equipment Used: Rolling walker Ambulation Related to ADLs: Pt. ambulates with supervision during OT ADL Comments: Pt. unable to access Lt. foot, but reports he plans to have wife assist him with LB ADLs until he is able to perform on his own.  Pt. has all DME, and has walk in shower, and able to verbalize safe technique for safe transfer.  No further OT needs identified Vision/Perception    Cognition Cognition Arousal/Alertness: Awake/alert Overall Cognitive Status: Appears within functional limits for tasks assessed Orientation Level: Oriented X4 Sensation/Coordination Coordination Gross Motor Movements are Fluid and Coordinated: Yes Fine Motor Movements are Fluid and Coordinated: Yes Extremity Assessment RUE Assessment RUE Assessment: Within Functional Limits LUE Assessment LUE Assessment: Within Functional Limits Mobility  Bed Mobility Bed Mobility: Yes Supine to Sit: 4: Min assist Supine to Sit Details (indicate cue type and reason): assist for L LE Transfers Transfers: Yes Sit to Stand: 4: Min assist (contact guard) Sit to Stand Details (indicate cue type and reason): verbal cues for hand placement Stand to Sit: 5: Supervision Exercises Total Joint Exercises Quad Sets: AROM;Left;10 reps;Supine Heel Slides: AAROM;Left;10 reps;Supine Hip ABduction/ADduction: AROM;Left;10 reps;Supine Straight Leg Raises: AROM;Left;10 reps;Supine End of Session OT - End of Session Equipment Utilized During Treatment: Left knee immobilizer Activity Tolerance: Patient tolerated treatment well Patient left: in bed;with call bell in reach;with family/visitor present Nurse Communication: Mobility status for  transfers General Behavior During Session: [facility] for tasks performed Cognition: [facility] for tasks performed   Maricela Schreur M 06/09/2011, 3:42 PM

## 2011-06-09 NOTE — Progress Notes (Signed)
Physical Therapy Evaluation Patient Details Name: Jake Dunn MRN: [id number] DOB: August 09, 1940 Today's Date: 06/09/2011  Problem List:  Patient Active Problem List  Diagnoses  . Pre-operative clearance    Past Medical History:  Past Medical History  Diagnosis Date  . Hypertension   . CAD (coronary artery disease)     s/p cath in August 2010 showing significant stenosis in the diagonal side branches and in the RV marginal branch. These vessels were small and diffusely diseased. He is managed medically.   . Gout   . GERD (gastroesophageal reflux disease)   . Osteoarthritis   . Renal cell carcinoma   . Adenomatous polyp   . Disc disease, degenerative, cervical   . Syncope and collapse   . Hyperlipidemia   . Anxiety   . History of back surgery   . Stroke     MINI STROKE 15+ YRS AGO    Past Surgical History:  Past Surgical History  Procedure Date  . Carpal tunnel release   . Lumbar fusion   . Nephrectomy   . Replacement total knee   . Cardiac catheterization 12/14/2008    ef 50-55%. SHOWED SIGNIFICANT STENOSIS AND TO DIAGONAL SIDE BRANCHES AND IN THE RIGHT VENTRICULAR MARGINAL BRANCH. THESE VESSELS WERE SMALL AND DIFFUSELY DISEASED  . Polypectomy   . Back surgery   . US echocardiography 12/15/2008    EF 60-65%  . US echocardiography 09/28/2004    EF 55-60%  . Cardiovascular stress test 12/10/2009    EF 61%. EKG negative. Mild peri ischemia. Managed medically.   . Cervical spine surgery     5 YRS AGO  . Joint replacement     RIGHT SHOULDER  . Total knee arthroplasty 06/08/2011    Procedure: TOTAL KNEE ARTHROPLASTY;  Surgeon: Loreta Ave, MD;  Location: [facility] OR;  Service: Orthopedics;  Laterality: Left;    PT Assessment/Plan/Recommendation PT Assessment Clinical Impression Statement: Patient highly motivated and tolerated session well. Patient s/p L TKA presenting with decreased L LE strength and active L knee ROM. Patient to benefit from PT in hospital and upon d/c to  maximize recovery. PT Recommendation/Assessment: Patient will need skilled PT in the acute care venue PT Problem List: Decreased strength;Decreased range of motion;Decreased activity tolerance;Decreased balance;Decreased mobility PT Therapy Diagnosis : Difficulty walking;Abnormality of gait;Generalized weakness;Acute pain PT Plan PT Frequency: 7X/week PT Treatment/Interventions: DME instruction;Gait training;Stair training;Functional mobility training;Therapeutic activities;Therapeutic exercise PT Recommendation Follow Up Recommendations: Home health PT Equipment Recommended:  (pt has DME already at home) PT Goals  Acute Rehab PT Goals PT Goal Formulation: With patient Time For Goal Achievement: 7 days Pt will go Supine/Side to Sit: with modified independence;with HOB 0 degrees PT Goal: Supine/Side to Sit - Progress: Goal set today Pt will go Sit to Stand: with modified independence PT Goal: Sit to Stand - Progress: Goal set today Pt will Ambulate: >150 feet;with modified independence;with rolling walker PT Goal: Ambulate - Progress: Goal set today Pt will Go Up / Down Stairs: 3-5 stairs;with supervision;with rail(s) PT Goal: Up/Down Stairs - Progress: Goal set today Pt will Perform Home Exercise Program: Independently PT Goal: Perform Home Exercise Program - Progress: Goal set today  PT Evaluation Precautions/Restrictions  Precautions Precautions: Knee Required Braces or Orthoses: Yes Knee Immobilizer: Discontinue post op day 2 Restrictions LLE Weight Bearing: Weight bearing as tolerated Prior Functioning  Home Living Lives With: Spouse Receives Help From: Family Type of Home: House Home Layout: One level Home Access: Stairs to enter Entrance Stairs-Rails:  Can reach both Entrance Stairs-Number of Steps: 3 Bathroom Shower/Tub: Health visitor: Handicapped height Bathroom Accessibility: Yes How Accessible: Accessible via walker Home Adaptive Equipment:  Bedside commode/3-in-1;Walker - rolling Prior Function Level of Independence: Independent with basic ADLs;Independent with homemaking with ambulation;Independent with gait;Independent with transfers Able to Take Stairs?: Yes Driving: Yes Vocation: Retired Producer, television/film/video: Awake/alert Overall Cognitive Status: Appears within functional limits for tasks assessed Orientation Level: Oriented X4 Sensation/Coordination Sensation Light Touch: Appears Intact Extremity Assessment RUE Assessment RUE Assessment: Within Functional Limits LUE Assessment LUE Assessment: Within Functional Limits RLE Assessment RLE Assessment: Within Functional Limits LLE Assessment LLE Assessment: Not tested Mobility (including Balance) Bed Mobility Bed Mobility: Yes Supine to Sit: 4: Min assist;HOB flat;With rails Supine to Sit Details (indicate cue type and reason): assit for L LE managment Transfers Transfers: Yes Sit to Stand: 4: Min assist Sit to Stand Details (indicate cue type and reason): verbal cues for hand placement Ambulation/Gait Ambulation/Gait: Yes Ambulation/Gait Assistance: 4: Min assist Ambulation Distance (Feet): 20 Feet Assistive device: Rolling walker Gait Pattern: Step-to pattern;Decreased step length - left;Decreased stance time - left;Decreased hip/knee flexion - left;Antalgic Gait velocity: decreased cadence Stairs: No    Exercise  Total Joint Exercises Ankle Circles/Pumps: AROM;Left;10 reps Quad Sets: AROM;Left;10 reps;Supine Heel Slides: Left;10 reps;Supine;AAROM End of Session PT - End of Session Equipment Utilized During Treatment: Gait belt;Left knee immobilizer Activity Tolerance: Patient tolerated treatment well Patient left: in chair;with call bell in reach Nurse Communication: Mobility status for transfers General Behavior During Session: [facility] for tasks performed Cognition: [facility] for tasks performed  Marcene Brawn 06/09/2011, 1:22  PM  Lewis Shock, PT, DPT Pager #: [phone] Office #: [phone]

## 2011-06-09 NOTE — Progress Notes (Signed)
Subjective: Pain controlled.  C/o nausea.     Objective: Vital signs in last 24 hours: Temp:  [97.8 F (36.6 C)-99.2 F (37.3 C)] 98.7 F (37.1 C) (01/31 0608) Pulse Rate:  [61-84] 84  (01/31 0608) Resp:  [10-20] 20  (01/31 0608) BP: (107-131)/(73-85) 131/85 mmHg (01/31 0608) SpO2:  [92 %-98 %] 94 % (01/31 0608)  Intake/Output from previous day: 01/30 0701 - 01/31 0700 In: 3810 [P.O.:680; I.V.:3130] Out: 800 [Urine:550; Drains:125; Blood:125] Intake/Output this shift:     [facility] 06/09/11 0545  HGB 11.8*    Basename 06/09/11 0545  WBC 8.2  RBC 3.81*  HCT 35.9*  PLT 173    Basename 06/09/11 0545 06/08/11 2147  NA 135 136  K 4.1 4.4  CL 100 102  CO2 26 26  BUN 14 14  CREATININE 1.00 0.98  GLUCOSE 132* 138*  CALCIUM 8.6 8.8    Basename 06/09/11 0545  LABPT --  INR 1.13   Dressing cdi.  Calf nt.  Assessment/Plan: Anticipate d/c home tomorrrow.  D/c pca.    Ephram Kornegay M 06/09/2011, 11:37 AM

## 2011-06-09 NOTE — Progress Notes (Signed)
Physical Therapy Treatment Patient Details Name: Jake Dunn MRN: [id number] DOB: 26-Jan-1941 Today's Date: 06/09/2011  PT Assessment/Plan  PT - Assessment/Plan Comments on Treatment Session: Patient tolerated session well. Patient to con't to have decreased L LE strength and active L knee ROM however is tolerating 90 degrees flexion well in CPM. PT Frequency: 7X/week Follow Up Recommendations: Home health PT PT Goals  Acute Rehab PT Goals PT Goal: Supine/Side to Sit - Progress: Progressing toward goal PT Goal: Sit to Stand - Progress: Progressing toward goal PT Goal: Ambulate - Progress: Progressing toward goal PT Goal: Up/Down Stairs - Progress: Progressing toward goal PT Goal: Perform Home Exercise Program - Progress: Progressing toward goal  PT Treatment Precautions/Restrictions  Precautions Precautions: Knee Required Braces or Orthoses: Yes Knee Immobilizer: Discontinue post op day 2 Restrictions Weight Bearing Restrictions: Yes LLE Weight Bearing: Weight bearing as tolerated Mobility (including Balance) Bed Mobility Bed Mobility: Yes Supine to Sit: 4: Min assist Supine to Sit Details (indicate cue type and reason): assist for L LE Transfers Sit to Stand: 4: Min assist (contact guard) Sit to Stand Details (indicate cue type and reason): verbal cues for hand placement Stand to Sit: 5: Supervision Ambulation/Gait Ambulation/Gait Assistance: 4: Min assist Ambulation Distance (Feet): 30 Feet Assistive device: Rolling walker Gait Pattern: Step-to pattern;Decreased step length - left;Decreased stance time - left Gait velocity: decreased cadence    Exercise  Total Joint Exercises Quad Sets: AROM;Left;10 reps;Supine Heel Slides: AAROM;Left;10 reps;Supine Hip ABduction/ADduction: AROM;Left;10 reps;Supine Straight Leg Raises: AROM;Left;10 reps;Supine End of Session PT - End of Session Equipment Utilized During Treatment: Gait belt Activity Tolerance: Patient limited by  fatigue Patient left: in bed;in CPM (CPM set 0-90 degrees flexion) General Behavior During Session: [facility] for tasks performed Cognition: [facility] for tasks performed  Marcene Brawn 06/09/2011, 3:27 PM  Lewis Shock, PT, DPT Pager #: [phone] Office #: [phone]

## 2011-06-09 NOTE — Progress Notes (Signed)
UR COMPLETED  

## 2011-06-09 NOTE — Progress Notes (Signed)
ANTICOAGULATION CONSULT NOTE - Follow Up Consult  Pharmacy Consult for Coumadin Indication: VTE prophylaxis  Allergies  Allergen Reactions  . Adhesive (Tape) Rash    Paper tape okay  . Latex Rash    Patient Measurements:   Heparin Dosing Weight:   Vital Signs: Temp: 98.7 F (37.1 C) (01/31 0608) BP: 131/85 mmHg (01/31 0608) Pulse Rate: 84  (01/31 0608)  Labs:  Alvira Philips 06/09/11 0545 06/08/11 2147  HGB 11.8* --  HCT 35.9* --  PLT 173 --  APTT -- --  LABPROT 14.7 --  INR 1.13 --  HEPARINUNFRC -- --  CREATININE 1.00 0.98  CKTOTAL -- --  CKMB -- --  TROPONINI -- --   The CrCl is unknown because both a height and weight (above a minimum accepted value) are required for this calculation.  Assessment: 70yom on Coumadin for VTE prophylaxis s/p TKA. INR (1.13) is subtherapeutic as expected but trended up appropriately with initial 7.5mg  dose. Pt is also receiving Lovenox 30 q12h until INR >/= 1.8. - H/H and Plts decreased  - No signficant bleeding reported  Goal of Therapy:  INR 2-3   Plan:  1. Repeat Coumadin 7.5mg  po x 1 2. Follow-up AM INR  Cleon Dew [phone] 06/09/2011,8:43 AM

## 2011-06-10 LAB — CBC
MCV: 92.4 fL (ref 78.0–100.0)
Platelets: 123 10*3/uL — ABNORMAL LOW (ref 150–400)
RDW: 14.6 % (ref 11.5–15.5)
WBC: 6.5 10*3/uL (ref 4.0–10.5)

## 2011-06-10 LAB — BASIC METABOLIC PANEL
Calcium: 8.5 mg/dL (ref 8.4–10.5)
Chloride: 105 mEq/L (ref 96–112)
Creatinine, Ser: 1.02 mg/dL (ref 0.50–1.35)
GFR calc Af Amer: 85 mL/min — ABNORMAL LOW (ref >90)

## 2011-06-10 LAB — PROTIME-INR: Prothrombin Time: 20.6 seconds — ABNORMAL HIGH (ref 11.6–15.2)

## 2011-06-10 NOTE — Progress Notes (Signed)
Physical Therapy Treatment Patient Details Name: Jake Dunn MRN: [id number] DOB: Mar 03, 1941 Today's Date: 06/10/2011  PT Assessment/Plan  PT - Assessment/Plan Comments on Treatment Session: Pt increased ambulation distance today with KI d/c'd.  No buckling of Lt knee noted.  Did well with stair training.  Eager to d/c home.   PT Frequency: 7X/week Follow Up Recommendations: Home health PT PT Goals  Acute Rehab PT Goals PT Goal: Sit to Stand - Progress: Progressing toward goal PT Goal: Ambulate - Progress: Progressing toward goal PT Goal: Up/Down Stairs - Progress: Progressing toward goal PT Goal: Perform Home Exercise Program - Progress: Progressing toward goal  PT Treatment Precautions/Restrictions  Precautions Precautions: Knee Required Braces or Orthoses: No Knee Immobilizer: Discontinue post op day 2 Restrictions Weight Bearing Restrictions: Yes LLE Weight Bearing: Weight bearing as tolerated Mobility (including Balance) Bed Mobility Bed Mobility: No Transfers Sit to Stand: 5: Supervision;From chair/3-in-1;With upper extremity assist;With armrests Stand to Sit: 5: Supervision;To chair/3-in-1;With upper extremity assist;With armrests Ambulation/Gait Ambulation/Gait Assistance: 5: Supervision Ambulation/Gait Assistance Details (indicate cue type and reason): Cues to decrease reliance of UE's on RW, sequencing, & to stay inside RW.  Ambulation Distance (Feet): 150 Feet Assistive device: Rolling walker Gait Pattern: Step-through pattern;Decreased stance time - left;Decreased step length - right;Decreased stride length;Antalgic Stairs: Yes Stairs Assistance: Other (comment) (Min Guard (A)) Stairs Assistance Details (indicate cue type and reason): Cues for sequencing & technique.  Stair Management Technique: Two rails;Step to pattern;Forwards Number of Stairs: 3  Wheelchair Mobility Wheelchair Mobility: No    Exercise  Total Joint Exercises Ankle Circles/Pumps:  AROM;Both;10 reps;Seated Quad Sets: AROM;Strengthening;10 reps;Both;Seated Straight Leg Raises: AROM;Strengthening;Left;10 reps;Other (comment) (Longsitting in recliner. ) End of Session PT - End of Session Equipment Utilized During Treatment: Gait belt Activity Tolerance: Patient tolerated treatment well Patient left: in chair General Behavior During Session: [facility] for tasks performed Cognition: [facility] for tasks performed  Lara Mulch 06/10/2011, 12:00 PM [phone]

## 2011-06-10 NOTE — Progress Notes (Signed)
Subjective: Doing well, pain controlled.  Wants to go home today.  Objective: Vital signs in last 24 hours: Temp:  [98.2 F (36.8 C)-98.8 F (37.1 C)] 98.7 F (37.1 C) (02/01 0625) Pulse Rate:  [81-94] 94  (02/01 0625) Resp:  [16-20] 16  (02/01 0625) BP: (111-126)/(63-78) 126/69 mmHg (02/01 0625) SpO2:  [95 %-97 %] 97 % (02/01 0625)  Intake/Output from previous day: 01/31 0701 - 02/01 0700 In: 2150 [P.O.:1070; I.V.:1080] Out: 1100 [Urine:1100] Intake/Output this shift:     Basename 06/10/11 0525 06/09/11 0545  HGB 10.3* 11.8*    Basename 06/10/11 0525 06/09/11 0545  WBC 6.5 8.2  RBC 3.29* 3.81*  HCT 30.4* 35.9*  PLT 123* 173    Basename 06/10/11 0525 06/09/11 0545  NA 138 135  K 4.4 4.1  CL 105 100  CO2 27 26  BUN 10 14  CREATININE 1.02 1.00  GLUCOSE 109* 132*  CALCIUM 8.5 8.6    Basename 06/10/11 0525 06/09/11 0545  LABPT -- --  INR 1.73* 1.13   Wound looks good.  Staples intact.  No drainage or signs of infection.  hemovac removed. Assessment/Plan: D/c home today after has bowel movement.  F/u 2 weeks postop.   Jake Dunn 06/10/2011, 9:14 AM

## 2011-06-27 ENCOUNTER — Telehealth: Payer: Self-pay | Admitting: Cardiology

## 2011-06-27 MED ORDER — CLOPIDOGREL BISULFATE 75 MG PO TABS: 75.0000 mg | ORAL_TABLET | Freq: Every day | ORAL | Status: AC

## 2011-06-27 NOTE — Telephone Encounter (Signed)
PLAVIX SCRIPT SENT TO [facility] VIA EPIC PER PT'S REQUEST .Zack Seal

## 2011-06-27 NOTE — Telephone Encounter (Signed)
New Problem   Patient called for a refill on Plavix RX, which is not available in med list. Plavix Generic was prescribed for patient previously, but he was taken off before knee replacement which was January 2013.  Please return a call to patient regarding starting back up on the plavix  Meds.  Mr or Mrs. Jake Dunn can be reached at hm# [phone].

## 2011-07-06 NOTE — Discharge Summary (Signed)
   ABBREVIATED DISCHARGE SUMMARY  DATE OF HOSPITALIZATION:  08 Jun 2011     REASON FOR HOSPITALIZATION:  70 YO WM WITH HISTORY OF END STAGE DJD LEFT KNEE AND CHRONIC PAIN.      SIGNIFICANT FINDINGS:  DJD  OPERATION: LEFT TKR      FINAL DIAGNOSIS:  SAME   SECONDARY DIAGNOSIS:  NONE   CONSULTANTS:  NONE  DISCHARGE CONDITION:  STABLE  DISCHARGED TO:  HOME

## 2011-10-24 ENCOUNTER — Encounter: Payer: Self-pay | Admitting: Cardiology

## 2011-11-28 ENCOUNTER — Other Ambulatory Visit: Payer: Self-pay | Admitting: Urology

## 2011-11-28 ENCOUNTER — Ambulatory Visit (HOSPITAL_COMMUNITY)
Admission: RE | Admit: 2011-11-28 | Discharge: 2011-11-28 | Disposition: A | Discharge status: Home / Self Care | Payer: Medicare Other | Source: Ambulatory Visit | Attending: Urology | Admitting: Urology

## 2011-11-28 ENCOUNTER — Other Ambulatory Visit (HOSPITAL_COMMUNITY): Payer: Self-pay | Admitting: Urology

## 2011-11-28 DIAGNOSIS — C649 Malignant neoplasm of unspecified kidney, except renal pelvis: Secondary | ICD-10-CM

## 2012-06-06 ENCOUNTER — Encounter: Payer: Self-pay | Admitting: Cardiology

## 2012-08-07 ENCOUNTER — Encounter (HOSPITAL_COMMUNITY): Payer: Self-pay | Admitting: Pharmacy Technician

## 2012-08-07 ENCOUNTER — Telehealth: Payer: Self-pay | Admitting: Cardiology

## 2012-08-07 ENCOUNTER — Other Ambulatory Visit: Payer: Self-pay | Admitting: Nurse Practitioner

## 2012-08-07 ENCOUNTER — Ambulatory Visit (INDEPENDENT_AMBULATORY_CARE_PROVIDER_SITE_OTHER): Payer: Medicare Other | Admitting: Nurse Practitioner

## 2012-08-07 ENCOUNTER — Ambulatory Visit
Admission: RE | Admit: 2012-08-07 | Discharge: 2012-08-07 | Disposition: A | Discharge status: Home / Self Care | Payer: Medicare Other | Source: Ambulatory Visit | Attending: Nurse Practitioner | Admitting: Nurse Practitioner

## 2012-08-07 ENCOUNTER — Ambulatory Visit (HOSPITAL_COMMUNITY): Payer: Medicare Other | Attending: Cardiology | Admitting: Radiology

## 2012-08-07 ENCOUNTER — Encounter: Payer: Self-pay | Admitting: Nurse Practitioner

## 2012-08-07 VITALS — BP 170/98 | HR 68 | Ht 67.0 in | Wt 236.8 lb

## 2012-08-07 DIAGNOSIS — R0600 Dyspnea, unspecified: Secondary | ICD-10-CM

## 2012-08-07 DIAGNOSIS — R0609 Other forms of dyspnea: Secondary | ICD-10-CM

## 2012-08-07 DIAGNOSIS — Z01810 Encounter for preprocedural cardiovascular examination: Secondary | ICD-10-CM

## 2012-08-07 DIAGNOSIS — R0989 Other specified symptoms and signs involving the circulatory and respiratory systems: Secondary | ICD-10-CM | POA: Insufficient documentation

## 2012-08-07 LAB — BASIC METABOLIC PANEL
BUN: 19 mg/dL (ref 6–23)
CO2: 25 mEq/L (ref 19–32)
Calcium: 9.3 mg/dL (ref 8.4–10.5)
Chloride: 103 mEq/L (ref 96–112)
Creatinine, Ser: 1.2 mg/dL (ref 0.4–1.5)
GFR: 63.05 mL/min (ref >60.00)
Glucose, Bld: 93 mg/dL (ref 70–99)
Potassium: 4.5 mEq/L (ref 3.5–5.1)
Sodium: 137 mEq/L (ref 135–145)

## 2012-08-07 LAB — CBC WITH DIFFERENTIAL/PLATELET
Basophils Absolute: 0 10*3/uL (ref 0.0–0.1)
Basophils Relative: 0.6 % (ref 0.0–3.0)
Eosinophils Absolute: 0.4 10*3/uL (ref 0.0–0.7)
Eosinophils Relative: 4.3 % (ref 0.0–5.0)
HCT: 42.9 % (ref 39.0–52.0)
Hemoglobin: 14.7 g/dL (ref 13.0–17.0)
Lymphocytes Relative: 17.9 % (ref 12.0–46.0)
Lymphs Abs: 1.5 10*3/uL (ref 0.7–4.0)
MCHC: 34.3 g/dL (ref 30.0–36.0)
MCV: 91.4 fl (ref 78.0–100.0)
Monocytes Absolute: 1 10*3/uL (ref 0.1–1.0)
Monocytes Relative: 11.6 % (ref 3.0–12.0)
Neutro Abs: 5.7 10*3/uL (ref 1.4–7.7)
Neutrophils Relative %: 65.6 % (ref 43.0–77.0)
Platelets: 216 10*3/uL (ref 150.0–400.0)
RBC: 4.7 Mil/uL (ref 4.22–5.81)
RDW: 13.8 % (ref 11.5–14.6)
WBC: 8.6 10*3/uL (ref 4.5–10.5)

## 2012-08-07 LAB — BRAIN NATRIURETIC PEPTIDE: Pro B Natriuretic peptide (BNP): 88 pg/mL (ref 0.0–100.0)

## 2012-08-07 LAB — TROPONIN I: Troponin I: <0.3 ng/mL (ref <0.30)

## 2012-08-07 LAB — D-DIMER, QUANTITATIVE: D-Dimer, Quant: <0.27 ug/mL-FEU (ref 0.00–0.48)

## 2012-08-07 MED ORDER — ISOSORBIDE MONONITRATE ER 30 MG PO TB24: 30.0000 mg | ORAL_TABLET | Freq: Every day | ORAL | Status: DC

## 2012-08-07 NOTE — Progress Notes (Addendum)
 Jimmie W Mckillop Date of Birth: 03/06/1941 Medical Record #[id number]  History of Present Illness: Mr. Middendorf is seen back today for a work in visit. He is seen for Dr. Jordan. He has known CAD per cath in 2010 with last stress test in 2011. Managed medically. EF 61%. On chronic Plavix. Had left TKR last year. His other issues include HTN, gout, GERD, OA, renal cell carcinoma, DJD, HLD, and anxiety. Had a negative CT of the chest back in 2011.   Last seen in January of 2013 - was cleared for his knee surgery. We have not seen him here in the office since that time.   He comes back today. He is here alone. Wife called earlier this morning to report that he was having dyspnea. Woke up last night short of breath. Has had dyspnea for the past one week and seems to be getting worse. Notes no cough. No fever or chills. No nightsweats. Does worse at night. Has been woken up several times with SOB. Some chest pressure noted as well - this seems to be exertional. Will last 5 to 20 minutes. Also feels a chest heaviness when he reclines. No swelling. Weight has been stable. BP has been up at home. Sometimes he will take an extra metoprolol for his BP. He had his last renal cell follow up about 6 months ago.   Current Outpatient Prescriptions on File Prior to Visit  Medication Sig Dispense Refill  . allopurinol (ZYLOPRIM) 300 MG tablet Take 300 mg by mouth daily.      . LORazepam (ATIVAN) 0.5 MG tablet Take 0.5 mg by mouth 2 (two) times daily.      . metoprolol (LOPRESSOR) 50 MG tablet Take 50 mg by mouth 2 (two) times daily.      . pantoprazole (PROTONIX) 40 MG tablet Take 40 mg by mouth daily.       No current facility-administered medications on file prior to visit.    Allergies  Allergen Reactions  . Adhesive (Tape) Rash    Paper tape okay  . Latex Rash    Past Medical History  Diagnosis Date  . Hypertension   . CAD (coronary artery disease)     s/p cath in August 2010 showing significant  stenosis in the diagonal side branches and in the RV marginal branch. These vessels were small and diffusely diseased. He is managed medically.   . Gout   . GERD (gastroesophageal reflux disease)   . Osteoarthritis   . Renal cell carcinoma   . Adenomatous polyp   . Disc disease, degenerative, cervical   . Syncope and collapse   . Hyperlipidemia   . Anxiety   . History of back surgery   . Stroke     MINI STROKE 15+ YRS AGO     Past Surgical History  Procedure Laterality Date  . Carpal tunnel release    . Lumbar fusion    . Nephrectomy    . Replacement total knee    . Cardiac catheterization  12/14/2008    ef 50-55%. SHOWED SIGNIFICANT STENOSIS AND TO DIAGONAL SIDE BRANCHES AND IN THE RIGHT VENTRICULAR MARGINAL BRANCH. THESE VESSELS WERE SMALL AND DIFFUSELY DISEASED  . Polypectomy    . Back surgery    . Us echocardiography  12/15/2008    EF 60-65%  . Us echocardiography  09/28/2004    EF 55-60%  . Cardiovascular stress test  12/10/2009    EF 61%. EKG negative. Mild peri ischemia. Managed medically.   .   Cervical spine surgery      5 YRS AGO  . Joint replacement      RIGHT SHOULDER  . Total knee arthroplasty  06/08/2011    Procedure: TOTAL KNEE ARTHROPLASTY;  Surgeon: Daniel F Murphy, MD;  Location: [facility] OR;  Service: Orthopedics;  Laterality: Left;    History  Smoking status  . Former Smoker  . Quit date: 05/31/1989  Smokeless tobacco  . Not on file    History  Alcohol Use  . Yes    Family History  Problem Relation Age of Onset  . Heart attack Father   . Heart disease Brother     Review of Systems: The review of systems is per the HPI.  All other systems were reviewed and are negative.  Physical Exam: BP 170/98  Pulse 68  Ht 5' 7" (1.702 m)  Wt 236 lb 12.8 oz (107.412 kg)  BMI 37.08 kg/m2  SpO2 98% Weight is up one pound since last visit here.  Patient is very pleasant and in no acute distress. He is short of breath with walking here in the office. His oxygen  sats do NOT drop and stay at 98%. No active chest pain reported at this time. Skin is warm and dry. Color is normal.  HEENT is unremarkable. Normocephalic/atraumatic. PERRL. Sclera are nonicteric. Neck is supple. No masses. No JVD. Lungs are clear. Cardiac exam shows a regular rate and rhythm. No murmur or rub noted. Abdomen is soft. Extremities are without edema. Gait and ROM are intact. No gross neurologic deficits noted.  LABORATORY DATA: Pending  EKG today shows sinus rhythm. No acute changes. Appears unchanged from past tracings.   Lab Results  Component Value Date   WBC 6.5 06/10/2011   HGB 10.3* 06/10/2011   HCT 30.4* 06/10/2011   PLT 123* 06/10/2011   GLUCOSE 109* 06/10/2011   CHOL  Value: 159        ATP III CLASSIFICATION:  <200     mg/dL   Desirable  200-239  mg/dL   Borderline High  >=240    mg/dL   High        12/15/2008   TRIG 240* 12/15/2008   HDL 32* 12/15/2008   LDLCALC  Value: 79        Total Cholesterol/HDL:CHD Risk Coronary Heart Disease Risk Table                     Men   Women  1/2 Average Risk   3.4   3.3  Average Risk       5.0   4.4  2 X Average Risk   9.6   7.1  3 X Average Risk  23.4   11.0        Use the calculated Patient Ratio above and the CHD Risk Table to determine the patient's CHD Risk.        ATP III CLASSIFICATION (LDL):  <100     mg/dL   Optimal  100-129  mg/dL   Near or Above                    Optimal  130-159  mg/dL   Borderline  160-189  mg/dL   High  >190     mg/dL   Very High 12/15/2008   ALT 19 06/02/2011   AST 17 06/02/2011   NA 138 06/10/2011   K 4.4 06/10/2011   CL 105 06/10/2011   CREATININE 1.02 06/10/2011     BUN 10 06/10/2011   CO2 27 06/10/2011   INR 1.73* 06/10/2011    Assessment / Plan: 1. Dyspnea - possible explanations include pericardial effusion, CHF, lung disease or tumor - has had past renal cell cancer. Will check CXR today. Checking stat labs today. May need CT scanning. For echo this afternoon at 2pm.  2. Chest pain - some exertional and some with lying  supine. Will check the echo. Await labs. Further disposition to follow. May need repeat cath in light of known CAD.   3. HTN - blood pressure is up. Sounds like it has been up at home. Will see what his tests look like and will need to add further antihypertensive medicines.   Patient is agreeable to this plan and will call if any problems develop in the interim.   Jeyson Deshotel C. Arpi Diebold, RN, ANP-C [facility] [address] [place], [place]  [address]  Addendum: Preliminary echo shows good LV function. No effusion. D Dimer is negative. CXR was negative for acute findings. Still waiting on troponin. Will add Imdur 30 mg a day. Arrange for cardiac catheterization later this week.     

## 2012-08-07 NOTE — Telephone Encounter (Signed)
New problem    Pt having trouble sleeping at night due to having a hard time breathing (sob) [id number]-offered pt an appt to see a pa/np but declined-wants to see dr Jake Dunn unsure if he should go to the ER

## 2012-08-07 NOTE — Progress Notes (Signed)
Echocardiogram performed.  

## 2012-08-07 NOTE — Patient Instructions (Addendum)
We need to check some labs today  We are going to send you for a CXR - go to [facility] on the first floor at the [employer]  We need to get an ultrasound of your heart - this will be done today here in this office      ADDENDUM:  Your XRAY was ok.  Preliminary echo looks ok.  Lab for pulmonary emboli is negative.  I am adding Imdur 30 mg a day - this should help with your breathing and chest discomfort.   Will arrange for cardiac catheterization this week with Dr. Swaziland  You are scheduled for a cardiac catheterization on Thursday, April 3rd at [facility] with Dr. Swaziland or associate.  Go to [facility] 2nd Floor Short Stay on Thursday, April 3rd at 7 am. No food or drink after midnight on Wednesday.  You may take your medications with a sip of water on the day of your procedure.

## 2012-08-07 NOTE — Addendum Note (Signed)
Addended by: Rosalio Macadamia on: 08/07/2012 02:53 PM   Modules accepted: Orders

## 2012-08-07 NOTE — Telephone Encounter (Signed)
Patient called spoke to wife she stated husband woke up last night sob.States he has been sob for 1 week and seems to be getting worse.Appointment scheduled with Norma Fredrickson NP today at 11:30 am.

## 2012-08-09 ENCOUNTER — Encounter (HOSPITAL_COMMUNITY)
Admission: RE | Disposition: A | Discharge status: Home / Self Care | Payer: Self-pay | Source: Ambulatory Visit | Attending: Cardiology

## 2012-08-09 ENCOUNTER — Ambulatory Visit (HOSPITAL_COMMUNITY)
Admission: RE | Admit: 2012-08-09 | Discharge: 2012-08-09 | Disposition: A | Discharge status: Home / Self Care | Payer: Medicare Other | Source: Ambulatory Visit | Attending: Cardiology | Admitting: Cardiology

## 2012-08-09 DIAGNOSIS — Z7902 Long term (current) use of antithrombotics/antiplatelets: Secondary | ICD-10-CM | POA: Insufficient documentation

## 2012-08-09 DIAGNOSIS — Z01810 Encounter for preprocedural cardiovascular examination: Secondary | ICD-10-CM

## 2012-08-09 DIAGNOSIS — E785 Hyperlipidemia, unspecified: Secondary | ICD-10-CM | POA: Insufficient documentation

## 2012-08-09 DIAGNOSIS — I10 Essential (primary) hypertension: Secondary | ICD-10-CM | POA: Insufficient documentation

## 2012-08-09 DIAGNOSIS — I2510 Atherosclerotic heart disease of native coronary artery without angina pectoris: Secondary | ICD-10-CM

## 2012-08-09 DIAGNOSIS — R0602 Shortness of breath: Secondary | ICD-10-CM | POA: Insufficient documentation

## 2012-08-09 DIAGNOSIS — R079 Chest pain, unspecified: Secondary | ICD-10-CM | POA: Insufficient documentation

## 2012-08-09 HISTORY — PX: LEFT HEART CATHETERIZATION WITH CORONARY ANGIOGRAM: SHX5451

## 2012-08-09 LAB — PROTIME-INR
INR: 0.9 (ref 0.00–1.49)
Prothrombin Time: 12.1 seconds (ref 11.6–15.2)

## 2012-08-09 SURGERY — LEFT HEART CATHETERIZATION WITH CORONARY ANGIOGRAM
Anesthesia: LOCAL

## 2012-08-09 MED ORDER — MIDAZOLAM HCL 2 MG/2ML IJ SOLN
INTRAMUSCULAR | Status: AC
  Filled 2012-08-09: qty 2

## 2012-08-09 MED ORDER — LIDOCAINE HCL (PF) 1 % IJ SOLN
INTRAMUSCULAR | Status: AC
  Filled 2012-08-09: qty 30

## 2012-08-09 MED ORDER — SODIUM CHLORIDE 0.9 % IJ SOLN: 3.0000 mL | Freq: Two times a day (BID) | INTRAMUSCULAR | Status: DC

## 2012-08-09 MED ORDER — ONDANSETRON HCL 4 MG/2ML IJ SOLN: 4.0000 mg | Freq: Four times a day (QID) | INTRAMUSCULAR | Status: DC | PRN

## 2012-08-09 MED ORDER — SODIUM CHLORIDE 0.9 % IV SOLN: 250.0000 mL | INTRAVENOUS | Status: DC | PRN

## 2012-08-09 MED ORDER — ASPIRIN 81 MG PO CHEW
324.0000 mg | CHEWABLE_TABLET | ORAL | Status: AC
  Administered 2012-08-09: 324 mg via ORAL
  Filled 2012-08-09: qty 4

## 2012-08-09 MED ORDER — DIAZEPAM 5 MG PO TABS
10.0000 mg | ORAL_TABLET | ORAL | Status: AC
  Administered 2012-08-09: 10 mg via ORAL
  Filled 2012-08-09: qty 2

## 2012-08-09 MED ORDER — SODIUM CHLORIDE 0.9 % IV SOLN: 1.0000 mL/kg/h | INTRAVENOUS | Status: DC

## 2012-08-09 MED ORDER — HEPARIN (PORCINE) IN NACL 2-0.9 UNIT/ML-% IJ SOLN
INTRAMUSCULAR | Status: AC
  Filled 2012-08-09: qty 1000

## 2012-08-09 MED ORDER — VERAPAMIL HCL 2.5 MG/ML IV SOLN
INTRAVENOUS | Status: AC
  Filled 2012-08-09: qty 2

## 2012-08-09 MED ORDER — FENTANYL CITRATE 0.05 MG/ML IJ SOLN
INTRAMUSCULAR | Status: AC
  Filled 2012-08-09: qty 2

## 2012-08-09 MED ORDER — HEPARIN SODIUM (PORCINE) 1000 UNIT/ML IJ SOLN
INTRAMUSCULAR | Status: AC
  Filled 2012-08-09: qty 1

## 2012-08-09 MED ORDER — HYDROMORPHONE HCL PF 2 MG/ML IJ SOLN
INTRAMUSCULAR | Status: AC
  Filled 2012-08-09: qty 1

## 2012-08-09 MED ORDER — SODIUM CHLORIDE 0.9 % IJ SOLN: 3.0000 mL | INTRAMUSCULAR | Status: DC | PRN

## 2012-08-09 MED ORDER — ACETAMINOPHEN 325 MG PO TABS: 650.0000 mg | ORAL_TABLET | ORAL | Status: DC | PRN

## 2012-08-09 MED ORDER — SODIUM CHLORIDE 0.9 % IV SOLN
INTRAVENOUS | Status: DC
  Administered 2012-08-09: 08:00:00 via INTRAVENOUS

## 2012-08-09 NOTE — Discharge Instructions (Signed)
Radial Site Care Refer to this sheet in the next few weeks. These instructions provide you with information on caring for yourself after your procedure. Your caregiver may also give you more specific instructions. Your treatment has been planned according to current medical practices, but problems sometimes occur. Call your caregiver if you have any problems or questions after your procedure. HOME CARE INSTRUCTIONS  You may shower the day after the procedure.Remove the bandage (dressing) and gently wash the site with plain soap and water.Gently pat the site dry.  Do not apply powder or lotion to the site.  Do not submerge the affected site in water for 3 to 5 days.  Inspect the site at least twice daily.  Do not flex or bend the affected arm for 24 hours.  No lifting over 5 pounds (2.3 kg) for 5 days after your procedure.  Do not drive home if you are discharged the same day of the procedure. Have someone else drive you.  You may drive 24 hours after the procedure unless otherwise instructed by your caregiver.  Do not operate machinery or power tools for 24 hours.  A responsible adult should be with you for the first 24 hours after you arrive home. What to expect:  Any bruising will usually fade within 1 to 2 weeks.  Blood that collects in the tissue (hematoma) may be painful to the touch. It should usually decrease in size and tenderness within 1 to 2 weeks. SEEK IMMEDIATE MEDICAL CARE IF:  You have unusual pain at the radial site.  You have redness, warmth, swelling, or pain at the radial site.  You have drainage (other than a small amount of blood on the dressing).  You have chills.  You have a fever or persistent symptoms for more than 72 hours.  You have a fever and your symptoms suddenly get worse.  Your arm becomes pale, cool, tingly, or numb.  You have heavy bleeding from the site. Hold pressure on the site. Document Released: 05/28/2010 Document Revised:  07/18/2011 Document Reviewed: 05/28/2010 [facility] Patient Information 2013 [place], [place].

## 2012-08-09 NOTE — CV Procedure (Signed)
   Cardiac Catheterization Procedure Note  Name: Jake Dunn MRN: [id number] DOB: 04/29/1941  Procedure: Left Heart Cath, Selective Coronary Angiography, LV angiography  Indication: 72 yo WM with history of CAD presents with symptoms of worsening dyspnea.   Procedural Details: The right wrist was prepped, draped, and anesthetized with 1% lidocaine. Using the modified Seldinger technique, a 5 French sheath was introduced into the right radial artery. 3 mg of verapamil was administered through the sheath, weight-based unfractionated heparin was administered intravenously. Standard Judkins catheters were used for selective coronary angiography and left ventriculography. Catheter exchanges were performed over an exchange length guidewire. There were no immediate procedural complications. A TR band was used for radial hemostasis at the completion of the procedure.  The patient was transferred to the post catheterization recovery area for further monitoring.  Procedural Findings: Hemodynamics: AO 121/69 mean of 90 mm Hg LV 126/15 mm Hg  Coronary angiography: Coronary dominance: right  Left mainstem: Moderate calcification. 20% stenosis.  Left anterior descending (LAD): Very large vessel. 30-40% stenosis in the mid vessel. It gives rise to 3 diagonal branches. The first diagonal is a large branch with 30% disease in the proximal vessel. The second diagonal is very large and normal. The third diagonal is moderate in size with diffuse 70% disease in the proximal to mid vessel.  Left circumflex (LCx): Small, normal.  Right coronary artery (RCA): Large dominant vessel. Diffuse 30% stenosis in the mid vessel. 30% stenosis at origin of PLOM. Second PLOM with 50% stenosis in the proximal vessel.  Left ventriculography: Left ventricular systolic function is normal, LVEF is estimated at 55-65%, there is no significant mitral regurgitation   Final Conclusions:   1. Single vessel obstructive CAD.  There is moderate stenosis in the third diagonal. Otherwise nonobstructive disease.  2. Normal LV function.  3. Normal LV filling pressures.   Recommendations: Compared to prior cath in 2010 there is no significant change. Need to consider other etiologies for dyspnea. Consider outpatient sleep study.  Theron Arista [facility] 08/09/2012, 9:28 AM

## 2012-08-09 NOTE — Progress Notes (Signed)
Echo Report Faxed to Dr. Cliffton Asters

## 2012-08-09 NOTE — Interval H&P Note (Signed)
History and Physical Interval Note:  08/09/2012 9:04 AM  Jake Dunn  has presented today for surgery, with the diagnosis of Chest pain  The various methods of treatment have been discussed with the patient and family. After consideration of risks, benefits and other options for treatment, the patient has consented to  Procedure(s): LEFT HEART CATHETERIZATION WITH CORONARY ANGIOGRAM (N/A) as a surgical intervention .  The patient's history has been reviewed, patient examined, no change in status, stable for surgery.  I have reviewed the patient's chart and labs.  Questions were answered to the patient's satisfaction.     Theron Arista [facility] 08/09/2012 9:05 AM

## 2012-08-09 NOTE — H&P (View-Only) (Signed)
Jake Dunn Date of Birth: 01-06-1941 Medical Record #[id number]  History of Present Illness: Jake Dunn is seen back today for a work in visit. He is seen for Dr. Swaziland. He has known CAD per cath in 2010 with last stress test in 2011. Managed medically. EF 61%. On chronic Plavix. Had left TKR last year. His other issues include HTN, gout, GERD, OA, renal cell carcinoma, DJD, HLD, and anxiety. Had a negative CT of the chest back in 2011.   Last seen in January of 2013 - was cleared for his knee surgery. We have not seen him here in the office since that time.   He comes back today. He is here alone. Wife called earlier this morning to report that he was having dyspnea. Woke up last night short of breath. Has had dyspnea for the past one week and seems to be getting worse. Notes no cough. No fever or chills. No nightsweats. Does worse at night. Has been woken up several times with SOB. Some chest pressure noted as well - this seems to be exertional. Will last 5 to 20 minutes. Also feels a chest heaviness when he reclines. No swelling. Weight has been stable. BP has been up at home. Sometimes he will take an extra metoprolol for his BP. He had his last renal cell follow up about 6 months ago.   Current Outpatient Prescriptions on File Prior to Visit  Medication Sig Dispense Refill  . allopurinol (ZYLOPRIM) 300 MG tablet Take 300 mg by mouth daily.      Marland Kitchen LORazepam (ATIVAN) 0.5 MG tablet Take 0.5 mg by mouth 2 (two) times daily.      . metoprolol (LOPRESSOR) 50 MG tablet Take 50 mg by mouth 2 (two) times daily.      . pantoprazole (PROTONIX) 40 MG tablet Take 40 mg by mouth daily.       No current facility-administered medications on file prior to visit.    Allergies  Allergen Reactions  . Adhesive (Tape) Rash    Paper tape okay  . Latex Rash    Past Medical History  Diagnosis Date  . Hypertension   . CAD (coronary artery disease)     s/p cath in August 2010 showing significant  stenosis in the diagonal side branches and in the RV marginal branch. These vessels were small and diffusely diseased. He is managed medically.   . Gout   . GERD (gastroesophageal reflux disease)   . Osteoarthritis   . Renal cell carcinoma   . Adenomatous polyp   . Disc disease, degenerative, cervical   . Syncope and collapse   . Hyperlipidemia   . Anxiety   . History of back surgery   . Stroke     MINI STROKE 15+ YRS AGO     Past Surgical History  Procedure Laterality Date  . Carpal tunnel release    . Lumbar fusion    . Nephrectomy    . Replacement total knee    . Cardiac catheterization  12/14/2008    ef 50-55%. SHOWED SIGNIFICANT STENOSIS AND TO DIAGONAL SIDE BRANCHES AND IN THE RIGHT VENTRICULAR MARGINAL BRANCH. THESE VESSELS WERE SMALL AND DIFFUSELY DISEASED  . Polypectomy    . Back surgery    . US echocardiography  12/15/2008    EF 60-65%  . US echocardiography  09/28/2004    EF 55-60%  . Cardiovascular stress test  12/10/2009    EF 61%. EKG negative. Mild peri ischemia. Managed medically.   Marland Kitchen  Cervical spine surgery      5 YRS AGO  . Joint replacement      RIGHT SHOULDER  . Total knee arthroplasty  06/08/2011    Procedure: TOTAL KNEE ARTHROPLASTY;  Surgeon: Loreta Ave, MD;  Location: [facility] OR;  Service: Orthopedics;  Laterality: Left;    History  Smoking status  . Former Smoker  . Quit date: 05/31/1989  Smokeless tobacco  . Not on file    History  Alcohol Use  . Yes    Family History  Problem Relation Age of Onset  . Heart attack Father   . Heart disease Brother     Review of Systems: The review of systems is per the HPI.  All other systems were reviewed and are negative.  Physical Exam: BP 170/98  Pulse 68  Ht 5\' 7"  (1.702 m)  Wt 236 lb 12.8 oz (107.412 kg)  BMI 37.08 kg/m2  SpO2 98% Weight is up one pound since last visit here.  Patient is very pleasant and in no acute distress. He is short of breath with walking here in the office. His oxygen  sats do NOT drop and stay at 98%. No active chest pain reported at this time. Skin is warm and dry. Color is normal.  HEENT is unremarkable. Normocephalic/atraumatic. PERRL. Sclera are nonicteric. Neck is supple. No masses. No JVD. Lungs are clear. Cardiac exam shows a regular rate and rhythm. No murmur or rub noted. Abdomen is soft. Extremities are without edema. Gait and ROM are intact. No gross neurologic deficits noted.  LABORATORY DATA: Pending  EKG today shows sinus rhythm. No acute changes. Appears unchanged from past tracings.   Lab Results  Component Value Date   WBC 6.5 06/10/2011   HGB 10.3* 06/10/2011   HCT 30.4* 06/10/2011   PLT 123* 06/10/2011   GLUCOSE 109* 06/10/2011   CHOL  Value: 159        ATP III CLASSIFICATION:  <200     mg/dL   Desirable  161-096  mg/dL   Borderline High  >=045    mg/dL   High        4/0/9811   TRIG 240* 12/15/2008   HDL 32* 12/15/2008   LDLCALC  Value: 79        Total Cholesterol/HDL:CHD Risk Coronary Heart Disease Risk Table                     Men   Women  1/2 Average Risk   3.4   3.3  Average Risk       5.0   4.4  2 X Average Risk   9.6   7.1  3 X Average Risk  23.4   11.0        Use the calculated Patient Ratio above and the CHD Risk Table to determine the patient's CHD Risk.        ATP III CLASSIFICATION (LDL):  <100     mg/dL   Optimal  914-782  mg/dL   Near or Above                    Optimal  130-159  mg/dL   Borderline  956-213  mg/dL   High  >086     mg/dL   Very High 09/13/8467   ALT 19 06/02/2011   AST 17 06/02/2011   NA 138 06/10/2011   K 4.4 06/10/2011   CL 105 06/10/2011   CREATININE 1.02 06/10/2011  BUN 10 06/10/2011   CO2 27 06/10/2011   INR 1.73* 06/10/2011    Assessment / Plan: 1. Dyspnea - possible explanations include pericardial effusion, CHF, lung disease or tumor - has had past renal cell cancer. Will check CXR today. Checking stat labs today. May need CT scanning. For echo this afternoon at 2pm.  2. Chest pain - some exertional and some with lying  supine. Will check the echo. Await labs. Further disposition to follow. May need repeat cath in light of known CAD.   3. HTN - blood pressure is up. Sounds like it has been up at home. Will see what his tests look like and will need to add further antihypertensive medicines.   Patient is agreeable to this plan and will call if any problems develop in the interim.   Rosalio Macadamia, RN, ANP-C [facility] [address] [place], [place]  [address]  Addendum: Preliminary echo shows good LV function. No effusion. D Dimer is negative. CXR was negative for acute findings. Still waiting on troponin. Will add Imdur 30 mg a day. Arrange for cardiac catheterization later this week.

## 2012-09-11 ENCOUNTER — Encounter: Payer: Self-pay | Admitting: Cardiology

## 2013-06-28 ENCOUNTER — Other Ambulatory Visit: Payer: Self-pay

## 2013-06-28 MED ORDER — CLOPIDOGREL BISULFATE 75 MG PO TABS: 75.0000 mg | ORAL_TABLET | Freq: Every day | ORAL | Status: DC

## 2013-09-06 ENCOUNTER — Ambulatory Visit (INDEPENDENT_AMBULATORY_CARE_PROVIDER_SITE_OTHER): Payer: Medicare Other | Admitting: Cardiology

## 2013-09-06 ENCOUNTER — Encounter: Payer: Self-pay | Admitting: Cardiology

## 2013-09-06 ENCOUNTER — Encounter (INDEPENDENT_AMBULATORY_CARE_PROVIDER_SITE_OTHER): Payer: Self-pay

## 2013-09-06 VITALS — BP 142/92 | HR 70 | Ht 67.0 in | Wt 230.0 lb

## 2013-09-06 DIAGNOSIS — I639 Cerebral infarction, unspecified: Secondary | ICD-10-CM

## 2013-09-06 DIAGNOSIS — E785 Hyperlipidemia, unspecified: Secondary | ICD-10-CM

## 2013-09-06 DIAGNOSIS — I6350 Cerebral infarction due to unspecified occlusion or stenosis of unspecified cerebral artery: Secondary | ICD-10-CM

## 2013-09-06 DIAGNOSIS — I2510 Atherosclerotic heart disease of native coronary artery without angina pectoris: Secondary | ICD-10-CM | POA: Insufficient documentation

## 2013-09-06 DIAGNOSIS — I10 Essential (primary) hypertension: Secondary | ICD-10-CM

## 2013-09-06 NOTE — Progress Notes (Signed)
Jake Dunn Date of Birth: 1941-04-21 Medical Record #[id number]  History of Present Illness: [name] is seen for yearly follow up. He has known CAD per cath in April 2014 with last stress test in 2011. He had 70% stenosis in the third diagonal branch with otherwise nonobstructive disease. Managed medically. EF 61%. On chronic Plavix.  His other issues include HTN, gout, GERD, OA, renal cell carcinoma, DJD, HLD, and anxiety. Had a negative CT of the chest back in 2011.  On follow up today he reports he is doing very well. Still has occ. Weak spells. No real dizziness. No chest pain. Occ. Dyspnea on exertion. Is very active. Does a lot of walking. States he mows 26 yards with a walk behind mower.   Current Outpatient Prescriptions on File Prior to Visit  Medication Sig Dispense Refill  . allopurinol (ZYLOPRIM) 300 MG tablet Take 300 mg by mouth daily.      Marland Kitchen atorvastatin (LIPITOR) 80 MG tablet Take 80 mg by mouth daily.      . clopidogrel (PLAVIX) 75 MG tablet Take 1 tablet (75 mg total) by mouth daily.  30 tablet  3  . colchicine 0.6 MG tablet Take 0.6 mg by mouth daily.      . fluticasone (FLOVENT DISKUS) 50 MCG/BLIST diskus inhaler Inhale 1 puff into the lungs as needed.      Marland Kitchen HYDROcodone-acetaminophen (VICODIN) 5-500 MG per tablet Take 1 tablet by mouth as needed for pain.      Marland Kitchen LORazepam (ATIVAN) 0.5 MG tablet Take 0.5 mg by mouth daily as needed. For anxiety      . metoprolol (LOPRESSOR) 50 MG tablet Take 50 mg by mouth 2 (two) times daily.       No current facility-administered medications on file prior to visit.    Allergies  Allergen Reactions  . Adhesive [Tape] Rash    Paper tape okay  . Latex Rash    Past Medical History  Diagnosis Date  . Hypertension   . CAD (coronary artery disease)     s/p cath in 2014 showing significant stenosis in the diagonal side branches and in the RV marginal branch. These vessels were small and diffusely diseased. He is managed  medically.   . Gout   . GERD (gastroesophageal reflux disease)   . Osteoarthritis   . Renal cell carcinoma   . Adenomatous polyp   . Disc disease, degenerative, cervical   . Syncope and collapse   . Hyperlipidemia   . Anxiety   . History of back surgery   . Stroke     MINI STROKE 15+ YRS AGO     Past Surgical History  Procedure Laterality Date  . Carpal tunnel release    . Lumbar fusion    . Nephrectomy    . Replacement total knee    . Cardiac catheterization  12/14/2008    ef 50-55%. SHOWED SIGNIFICANT STENOSIS AND TO DIAGONAL SIDE BRANCHES AND IN THE RIGHT VENTRICULAR MARGINAL BRANCH. THESE VESSELS WERE SMALL AND DIFFUSELY DISEASED  . Polypectomy    . Back surgery    . US echocardiography  12/15/2008    EF 60-65%  . US echocardiography  09/28/2004    EF 55-60%  . Cardiovascular stress test  12/10/2009    EF 61%. EKG negative. Mild peri ischemia. Managed medically.   . Cervical spine surgery      5 YRS AGO  . Joint replacement      RIGHT SHOULDER  .  Total knee arthroplasty  06/08/2011    Procedure: TOTAL KNEE ARTHROPLASTY;  Surgeon: [name], MD;  Location: [place];  Service: Orthopedics;  Laterality: Left;    History  Smoking status  . Former Smoker  . Quit date: 05/31/1989  Smokeless tobacco  . Not on file    History  Alcohol Use  . Yes    Family History  Problem Relation Age of Onset  . Heart attack Father   . Heart disease Brother     Review of Systems: The review of systems is per the HPI.  All other systems were reviewed and are negative.  Physical Exam: BP 142/92  Pulse 70  Ht 5\' 7"  (1.702 m)  Wt 230 lb (104.327 kg)  BMI 36.01 kg/m2  SpO2 97% Weight is down 6 lbs. Patient is very pleasant and in no acute distress.   Skin is warm and dry. Color is normal.  HEENT is unremarkable. Normocephalic/atraumatic. PERRL. Sclera are nonicteric. Neck is supple. No masses. No JVD. Lungs are clear. Cardiac exam shows a regular rate and rhythm. No murmur or  rub noted. Abdomen is soft. Extremities are without edema. Gait and ROM are intact. No gross neurologic deficits noted.  LABORATORY DATA: Pending  EKG today shows sinus rhythm. Rate 68 bpm No acute changes. LVH.  Lab Results  Component Value Date   WBC 8.6 08/07/2012   HGB 14.7 08/07/2012   HCT 42.9 08/07/2012   PLT 216.0 08/07/2012   GLUCOSE 93 08/07/2012   CHOL  Value: 159        ATP III CLASSIFICATION:  <200     mg/dL   Desirable  200-239  mg/dL   Borderline High  >=240    mg/dL   High        12/15/2008   TRIG 240* 12/15/2008   HDL 32* 12/15/2008   LDLCALC  Value: 79        Total Cholesterol/HDL:CHD Risk Coronary Heart Disease Risk Table                     Men   Women  1/2 Average Risk   3.4   3.3  Average Risk       5.0   4.4  2 X Average Risk   9.6   7.1  3 X Average Risk  23.4   11.0        Use the calculated Patient Ratio above and the CHD Risk Table to determine the patient's CHD Risk.        ATP III CLASSIFICATION (LDL):  <100     mg/dL   Optimal  100-129  mg/dL   Near or Above                    Optimal  130-159  mg/dL   Borderline  160-189  mg/dL   High  >190     mg/dL   Very High 12/15/2008   ALT 19 06/02/2011   AST 17 06/02/2011   NA 137 08/07/2012   K 4.5 08/07/2012   CL 103 08/07/2012   CREATININE 1.2 08/07/2012   BUN 19 08/07/2012   CO2 25 08/07/2012   INR 0.90 08/09/2012    Assessment / Plan: 1. CAD - 70% 3rd diagonal stenosis. No significant angina. Continue current therapy.  2.  HTN - blood pressure is fairly well controlled.   3. Hyperlipidemia- on lipitor. Labs followed by primary care.   4. Gout.  5.  History of CVA -on Plavix  I will follow up in one year.

## 2013-09-06 NOTE — Patient Instructions (Signed)
Continue your current therapy  I will see you in one year   

## 2014-04-10 ENCOUNTER — Other Ambulatory Visit: Payer: Self-pay | Admitting: Family Medicine

## 2014-04-10 DIAGNOSIS — M5489 Other dorsalgia: Secondary | ICD-10-CM

## 2014-04-11 ENCOUNTER — Ambulatory Visit
Admission: RE | Admit: 2014-04-11 | Discharge: 2014-04-11 | Disposition: A | Discharge status: Home / Self Care | Payer: Medicare Other | Source: Ambulatory Visit | Attending: Family Medicine | Admitting: Family Medicine

## 2014-04-11 DIAGNOSIS — M5489 Other dorsalgia: Secondary | ICD-10-CM

## 2014-04-17 ENCOUNTER — Encounter (HOSPITAL_COMMUNITY): Payer: Self-pay | Admitting: Cardiology

## 2014-05-22 ENCOUNTER — Other Ambulatory Visit: Payer: Self-pay | Admitting: Family Medicine

## 2014-05-22 DIAGNOSIS — M5136 Other intervertebral disc degeneration, lumbar region: Secondary | ICD-10-CM

## 2014-05-22 DIAGNOSIS — M5441 Lumbago with sciatica, right side: Secondary | ICD-10-CM

## 2014-05-22 DIAGNOSIS — M546 Pain in thoracic spine: Secondary | ICD-10-CM

## 2014-05-22 DIAGNOSIS — M51369 Other intervertebral disc degeneration, lumbar region without mention of lumbar back pain or lower extremity pain: Secondary | ICD-10-CM

## 2014-05-22 DIAGNOSIS — M5134 Other intervertebral disc degeneration, thoracic region: Secondary | ICD-10-CM

## 2014-05-28 ENCOUNTER — Ambulatory Visit
Admission: RE | Admit: 2014-05-28 | Discharge: 2014-05-28 | Disposition: A | Discharge status: Home / Self Care | Payer: PPO | Source: Ambulatory Visit | Attending: Family Medicine | Admitting: Family Medicine

## 2014-05-28 DIAGNOSIS — M546 Pain in thoracic spine: Secondary | ICD-10-CM

## 2014-05-28 DIAGNOSIS — M5136 Other intervertebral disc degeneration, lumbar region: Secondary | ICD-10-CM

## 2014-05-28 DIAGNOSIS — M5441 Lumbago with sciatica, right side: Secondary | ICD-10-CM

## 2014-05-28 DIAGNOSIS — M5134 Other intervertebral disc degeneration, thoracic region: Secondary | ICD-10-CM

## 2014-05-28 MED ORDER — GADOBENATE DIMEGLUMINE 529 MG/ML IV SOLN
20.0000 mL | Freq: Once | INTRAVENOUS | Status: AC | PRN
  Administered 2014-05-28: 20 mL via INTRAVENOUS

## 2014-07-17 DIAGNOSIS — R109 Unspecified abdominal pain: Secondary | ICD-10-CM | POA: Insufficient documentation

## 2014-09-12 ENCOUNTER — Ambulatory Visit (INDEPENDENT_AMBULATORY_CARE_PROVIDER_SITE_OTHER): Payer: PPO | Admitting: Cardiology

## 2014-09-12 ENCOUNTER — Encounter: Payer: Self-pay | Admitting: Cardiology

## 2014-09-12 VITALS — BP 160/88 | HR 68 | Ht 67.0 in | Wt 227.5 lb

## 2014-09-12 DIAGNOSIS — R0600 Dyspnea, unspecified: Secondary | ICD-10-CM | POA: Diagnosis not present

## 2014-09-12 DIAGNOSIS — I2510 Atherosclerotic heart disease of native coronary artery without angina pectoris: Secondary | ICD-10-CM

## 2014-09-12 DIAGNOSIS — E785 Hyperlipidemia, unspecified: Secondary | ICD-10-CM

## 2014-09-12 DIAGNOSIS — I10 Essential (primary) hypertension: Secondary | ICD-10-CM

## 2014-09-12 MED ORDER — HYDROCHLOROTHIAZIDE 12.5 MG PO CAPS: 12.5000 mg | ORAL_CAPSULE | Freq: Every day | ORAL | Status: DC

## 2014-09-12 NOTE — Patient Instructions (Signed)
Continue your current therapy  We will get your lab results from Dr. Dema Severin  Start HCTZ 12.5 mg daily  I will see you in 3 months

## 2014-09-12 NOTE — Progress Notes (Signed)
Jake Dunn Date of Birth: January 04, 1941 Medical Record #[id number]  History of Present Illness: Jake Dunn is seen for follow up CAD and HTN. He has known CAD per cath in April 2014 with last stress test in 2011. He had 70% stenosis in the third diagonal branch with otherwise nonobstructive disease. Managed medically. EF 61%. On chronic Plavix.  His other issues include HTN, gout, GERD, OA, renal cell carcinoma, DJD, HLD, and anxiety. Had a negative CT of the chest back in 2011.  On follow up today he reports he is having a lot of back and hip problems. Has pain in right flank. Renal US done without acute abnormality. Spinal MRI shows extensive arthritic changes. He is still very active. 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 Has rare lightheadedness if he stands too quick. Does note Dyspnea on exertion. BP at home ranges from 944-967 systolic.   Current Outpatient Prescriptions on File Prior to Visit  Medication Sig Dispense Refill  . allopurinol (ZYLOPRIM) 300 MG tablet Take 300 mg by mouth daily.    Marland Kitchen atorvastatin (LIPITOR) 80 MG tablet Take 80 mg by mouth daily.    . clopidogrel (PLAVIX) 75 MG tablet Take 1 tablet (75 mg total) by mouth daily. 30 tablet 3  . colchicine 0.6 MG tablet Take 0.6 mg by mouth daily.    . fluticasone (FLOVENT DISKUS) 50 MCG/BLIST diskus inhaler Inhale 1 puff into the lungs as needed.    Marland Kitchen LORazepam (ATIVAN) 0.5 MG tablet Take 0.5 mg by mouth daily as needed. For anxiety    . metoprolol (LOPRESSOR) 50 MG tablet Take 50 mg by mouth 2 (two) times daily.     No current facility-administered medications on file prior to visit.    Allergies  Allergen Reactions  . Adhesive [Tape] Rash    Paper tape okay  . Latex Rash    Past Medical History  Diagnosis Date  . Hypertension   . CAD (coronary artery disease)     s/p cath in 2014 showing significant stenosis in the diagonal side branches and in the RV marginal branch. These vessels were small and diffusely diseased. He is managed  medically.   . Gout   . GERD (gastroesophageal reflux disease)   . Osteoarthritis   . Renal cell carcinoma   . Adenomatous polyp   . Disc disease, degenerative, cervical   . Syncope and collapse   . Hyperlipidemia   . Anxiety   . History of back surgery   . Stroke     MINI STROKE 15+ YRS AGO     Past Surgical History  Procedure Laterality Date  . Carpal tunnel release    . Lumbar fusion    . Nephrectomy    . Replacement total knee    . Cardiac catheterization  12/14/2008    ef 50-55%. SHOWED SIGNIFICANT STENOSIS AND TO DIAGONAL SIDE BRANCHES AND IN THE RIGHT VENTRICULAR MARGINAL BRANCH. THESE VESSELS WERE SMALL AND DIFFUSELY DISEASED  . Polypectomy    . Back surgery    . US echocardiography  12/15/2008    EF 60-65%  . US echocardiography  09/28/2004    EF 55-60%  . Cardiovascular stress test  12/10/2009    EF 61%. EKG negative. Mild peri ischemia. Managed medically.   . Cervical spine surgery      5 YRS AGO  . Joint replacement      RIGHT SHOULDER  . Total knee arthroplasty  06/08/2011    Procedure: TOTAL KNEE ARTHROPLASTY;  Surgeon: Nestor Ramp  Percell Miller, MD;  Location: [place];  Service: Orthopedics;  Laterality: Left;  . Left heart catheterization with coronary angiogram N/A 08/09/2012    Procedure: LEFT HEART CATHETERIZATION WITH CORONARY ANGIOGRAM;  Surgeon: Peter M Martinique, MD;  Location: [facility] CATH LAB;  Service: Cardiovascular;  Laterality: N/A;    History  Smoking status  . Former Smoker  . Quit date: 05/31/1989  Smokeless tobacco  . Not on file    History  Alcohol Use  . 0.0 oz/week  . 0 Standard drinks or equivalent per week    Family History  Problem Relation Age of Onset  . Heart attack Father   . Heart disease Brother     Review of Systems: The review of systems is per the HPI.  All other systems were reviewed and are negative.  Physical Exam: BP 160/88 mmHg  Pulse 68  Ht 5\' 7"  (1.702 m)  Wt 227 lb 8 oz (103.193 kg)  BMI 35.62 kg/m2  Patient is very  pleasant and in no acute distress.   Skin is warm and dry. Color is normal.  HEENT is unremarkable. Normocephalic/atraumatic. PERRL. Sclera are nonicteric. Neck is supple. No masses. No JVD. Lungs are clear. Cardiac exam shows a regular rate and rhythm. No murmur or rub noted. Abdomen is soft. Extremities are without edema. Gait and ROM are intact. No gross neurologic deficits noted.  LABORATORY DATA:   EKG today shows sinus rhythm. Rate 66 bpm No acute changes. I have personally reviewed and interpreted this study.   Lab Results  Component Value Date   WBC 8.6 08/07/2012   HGB 14.7 08/07/2012   HCT 42.9 08/07/2012   PLT 216.0 08/07/2012   GLUCOSE 93 08/07/2012   CHOL  12/15/2008    159        ATP III CLASSIFICATION:  <200     mg/dL   Desirable  200-239  mg/dL   Borderline High  >=240    mg/dL   High          TRIG 240* 12/15/2008   HDL 32* 12/15/2008   LDLCALC  12/15/2008    79        Total Cholesterol/HDL:CHD Risk Coronary Heart Disease Risk Table                     Men   Women  1/2 Average Risk   3.4   3.3  Average Risk       5.0   4.4  2 X Average Risk   9.6   7.1  3 X Average Risk  23.4   11.0        Use the calculated Patient Ratio above and the CHD Risk Table to determine the patient's CHD Risk.        ATP III CLASSIFICATION (LDL):  <100     mg/dL   Optimal  100-129  mg/dL   Near or Above                    Optimal  130-159  mg/dL   Borderline  160-189  mg/dL   High  >190     mg/dL   Very High   ALT 19 06/02/2011   AST 17 06/02/2011   NA 137 08/07/2012   K 4.5 08/07/2012   CL 103 08/07/2012   CREATININE 1.2 08/07/2012   BUN 19 08/07/2012   CO2 25 08/07/2012   INR 0.90 08/09/2012   Labs from 03/28/14 reviewed: Normal BMET.  Assessment / Plan: 1. CAD - 70% 3rd diagonal stenosis. No significant angina. Continue current therapy.  2.  HTN - blood pressure is not well controlled. Continue metoprolol and losartan. Start HCTZ 12.5 mg daily. Need to be  cautious with meds since he has a history of hypotension and syncope in past when BP managed more aggressively.    3. Hyperlipidemia- on lipitor. Labs followed by primary care.   4. Gout.  5. History of CVA -on Plavix  I will follow up in 3 months with BMET

## 2014-09-17 ENCOUNTER — Encounter: Payer: Self-pay | Admitting: Cardiology

## 2014-12-18 LAB — BASIC METABOLIC PANEL
BUN: 23 mg/dL (ref 7–25)
CHLORIDE: 104 mmol/L (ref 98–110)
CO2: 22 mmol/L (ref 20–31)
CREATININE: 1.16 mg/dL (ref 0.70–1.18)
Calcium: 9.6 mg/dL (ref 8.6–10.3)
Glucose, Bld: 113 mg/dL — ABNORMAL HIGH (ref 65–99)
Potassium: 4.2 mmol/L (ref 3.5–5.3)
SODIUM: 139 mmol/L (ref 135–146)

## 2014-12-22 ENCOUNTER — Ambulatory Visit (INDEPENDENT_AMBULATORY_CARE_PROVIDER_SITE_OTHER): Payer: PPO | Admitting: Cardiology

## 2014-12-22 ENCOUNTER — Encounter: Payer: Self-pay | Admitting: Cardiology

## 2014-12-22 VITALS — BP 136/78 | HR 66 | Ht 67.0 in | Wt 228.4 lb

## 2014-12-22 DIAGNOSIS — E785 Hyperlipidemia, unspecified: Secondary | ICD-10-CM | POA: Diagnosis not present

## 2014-12-22 DIAGNOSIS — I2510 Atherosclerotic heart disease of native coronary artery without angina pectoris: Secondary | ICD-10-CM | POA: Diagnosis not present

## 2014-12-22 DIAGNOSIS — I10 Essential (primary) hypertension: Secondary | ICD-10-CM

## 2014-12-22 NOTE — Patient Instructions (Signed)
Continue your current therapy  I will see you in 6 months.   

## 2014-12-22 NOTE — Progress Notes (Signed)
Vickki Hearing Date of Birth: [birth year] Medical Record #[id number]  History of Present Illness: [name] is seen for follow up CAD and HTN. He has known CAD per cath in April 2014 with last stress test in 2011. He had 70% stenosis in the third diagonal branch with otherwise nonobstructive disease. Managed medically. EF 61%. On chronic Plavix.  His other issues include HTN, gout, GERD, OA, renal cell carcinoma, DJD, HLD, and anxiety. Had a negative CT of the chest back in 2011.  On follow up today he is doing very well. BP has improved significantly with addition of low dose HCTZ. No dizziness. 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 Does complain of tingling and numbness in right arm. Has a history of cervical spine disease and has had a couple of injections.   Current Outpatient Prescriptions on File Prior to Visit  Medication Sig Dispense Refill  . acyclovir (ZOVIRAX) 400 MG tablet Take 1 tablet by mouth 2 (two) times daily.  2  . allopurinol (ZYLOPRIM) 300 MG tablet Take 300 mg by mouth daily.    Marland Kitchen atorvastatin (LIPITOR) 80 MG tablet Take 80 mg by mouth daily.    . celecoxib (CELEBREX) 200 MG capsule Take 200 mg by mouth daily.    . clopidogrel (PLAVIX) 75 MG tablet Take 1 tablet (75 mg total) by mouth daily. 30 tablet 3  . colchicine 0.6 MG tablet Take 0.6 mg by mouth daily.    . fluticasone (FLOVENT DISKUS) 50 MCG/BLIST diskus inhaler Inhale 1 puff into the lungs as needed.    . hydrochlorothiazide (MICROZIDE) 12.5 MG capsule Take 1 capsule (12.5 mg total) by mouth daily. 90 capsule 3  . HYDROcodone-acetaminophen (NORCO/VICODIN) 5-325 MG per tablet Take 1 tablet by mouth every 6 (six) hours as needed.  0  . LORazepam (ATIVAN) 0.5 MG tablet Take 0.5 mg by mouth daily as needed. For anxiety    . metoprolol (LOPRESSOR) 50 MG tablet Take 50 mg by mouth 2 (two) times daily.    . pantoprazole (PROTONIX) 40 MG tablet Take 40 mg by mouth daily.     No current facility-administered medications on file prior  to visit.    Allergies  Allergen Reactions  . Adhesive [Tape] Rash    Paper tape okay  . Latex Rash    Past Medical History  Diagnosis Date  . Hypertension   . CAD (coronary artery disease)     s/p cath in 2014 showing significant stenosis in the diagonal side branches and in the RV marginal branch. These vessels were small and diffusely diseased. He is managed medically.   . Gout   . GERD (gastroesophageal reflux disease)   . Osteoarthritis   . Renal cell carcinoma   . Adenomatous polyp   . Disc disease, degenerative, cervical   . Syncope and collapse   . Hyperlipidemia   . Anxiety   . History of back surgery   . Stroke     MINI STROKE 15+ YRS AGO     Past Surgical History  Procedure Laterality Date  . Carpal tunnel release    . Lumbar fusion    . Nephrectomy    . Replacement total knee    . Cardiac catheterization  12/14/2008    ef 50-55%. SHOWED SIGNIFICANT STENOSIS AND TO DIAGONAL SIDE BRANCHES AND IN THE RIGHT VENTRICULAR MARGINAL BRANCH. THESE VESSELS WERE SMALL AND DIFFUSELY DISEASED  . Polypectomy    . Back surgery    . US echocardiography  12/15/2008  EF 60-65%  . US echocardiography  09/28/2004    EF 55-60%  . Cardiovascular stress test  12/10/2009    EF 61%. EKG negative. Mild peri ischemia. Managed medically.   . Cervical spine surgery      5 YRS AGO  . Joint replacement      RIGHT SHOULDER  . Total knee arthroplasty  06/08/2011    Procedure: TOTAL KNEE ARTHROPLASTY;  Surgeon: [name], MD;  Location: [place];  Service: Orthopedics;  Laterality: Left;  . Left heart catheterization with coronary angiogram N/A 08/09/2012    Procedure: LEFT HEART CATHETERIZATION WITH CORONARY ANGIOGRAM;  Surgeon: Dalma Panchal M Martinique, MD;  Location: [facility] CATH LAB;  Service: Cardiovascular;  Laterality: N/A;    History  Smoking status  . Former Smoker  . Quit date: 05/31/1989  Smokeless tobacco  . Not on file    History  Alcohol Use  . 0.0 oz/week  . 0 Standard drinks or  equivalent per week    Family History  Problem Relation Age of Onset  . Heart attack Father   . Heart disease Brother     Review of Systems: The review of systems is per the HPI.  All other systems were reviewed and are negative.  Physical Exam: BP 136/78 mmHg  Pulse 66  Ht 5\' 7"  (1.702 m)  Wt 103.619 kg (228 lb 7 oz)  BMI 35.77 kg/m2  Patient is very pleasant and in no acute distress.   Skin is warm and dry. Color is normal.  HEENT is unremarkable. Normocephalic/atraumatic. PERRL. Sclera are nonicteric. Neck is supple. No masses. No JVD. Lungs are clear. Cardiac exam shows a regular rate and rhythm. No murmur or rub noted. Abdomen is soft. Extremities are without edema. Gait and ROM are intact. No gross neurologic deficits noted.  LABORATORY DATA:      Lab Results  Component Value Date   WBC 8.6 08/07/2012   HGB 14.7 08/07/2012   HCT 42.9 08/07/2012   PLT 216.0 08/07/2012   GLUCOSE 113* 12/17/2014   CHOL  12/15/2008    159        ATP III CLASSIFICATION:  <200     mg/dL   Desirable  200-239  mg/dL   Borderline High  >=240    mg/dL   High          TRIG 240* 12/15/2008   HDL 32* 12/15/2008   LDLCALC  12/15/2008    79        Total Cholesterol/HDL:CHD Risk Coronary Heart Disease Risk Table                     Men   Women  1/2 Average Risk   3.4   3.3  Average Risk       5.0   4.4  2 X Average Risk   9.6   7.1  3 X Average Risk  23.4   11.0        Use the calculated Patient Ratio above and the CHD Risk Table to determine the patient's CHD Risk.        ATP III CLASSIFICATION (LDL):  <100     mg/dL   Optimal  100-129  mg/dL   Near or Above                    Optimal  130-159  mg/dL   Borderline  160-189  mg/dL   High  >190     mg/dL  Very High   ALT 19 06/02/2011   AST 17 06/02/2011   NA 139 12/17/2014   K 4.2 12/17/2014   CL 104 12/17/2014   CREATININE 1.16 12/17/2014   BUN 23 12/17/2014   CO2 22 12/17/2014   INR 0.90 08/09/2012     Assessment /  Plan: 1. CAD - 70% 3rd diagonal stenosis. No significant angina. Continue current therapy.  2.  HTN - blood pressure is well controlled. Continue metoprolol, HCTZ,  and losartan.   3. Hyperlipidemia- on lipitor. Labs followed by primary care.   4. Gout.  5. History of CVA -on Plavix  I will follow up in 6 months

## 2015-02-16 ENCOUNTER — Other Ambulatory Visit: Payer: Self-pay | Admitting: Gastroenterology

## 2015-08-02 ENCOUNTER — Emergency Department (HOSPITAL_COMMUNITY): Payer: PPO

## 2015-08-02 ENCOUNTER — Encounter (HOSPITAL_COMMUNITY): Payer: Self-pay | Admitting: Emergency Medicine

## 2015-08-02 ENCOUNTER — Emergency Department (HOSPITAL_COMMUNITY)
Admission: EM | Admit: 2015-08-02 | Discharge: 2015-08-02 | Disposition: A | Discharge status: Home / Self Care | Payer: PPO | Attending: Emergency Medicine | Admitting: Emergency Medicine

## 2015-08-02 DIAGNOSIS — F419 Anxiety disorder, unspecified: Secondary | ICD-10-CM | POA: Diagnosis not present

## 2015-08-02 DIAGNOSIS — M109 Gout, unspecified: Secondary | ICD-10-CM | POA: Insufficient documentation

## 2015-08-02 DIAGNOSIS — B349 Viral infection, unspecified: Secondary | ICD-10-CM | POA: Diagnosis not present

## 2015-08-02 DIAGNOSIS — R059 Cough, unspecified: Secondary | ICD-10-CM

## 2015-08-02 DIAGNOSIS — R05 Cough: Secondary | ICD-10-CM | POA: Diagnosis not present

## 2015-08-02 DIAGNOSIS — Z7902 Long term (current) use of antithrombotics/antiplatelets: Secondary | ICD-10-CM | POA: Insufficient documentation

## 2015-08-02 DIAGNOSIS — K219 Gastro-esophageal reflux disease without esophagitis: Secondary | ICD-10-CM | POA: Diagnosis not present

## 2015-08-02 DIAGNOSIS — I2510 Atherosclerotic heart disease of native coronary artery without angina pectoris: Secondary | ICD-10-CM | POA: Insufficient documentation

## 2015-08-02 DIAGNOSIS — Z91040 Latex allergy status: Secondary | ICD-10-CM | POA: Diagnosis not present

## 2015-08-02 DIAGNOSIS — Z79899 Other long term (current) drug therapy: Secondary | ICD-10-CM | POA: Insufficient documentation

## 2015-08-02 DIAGNOSIS — Z9889 Other specified postprocedural states: Secondary | ICD-10-CM | POA: Diagnosis not present

## 2015-08-02 DIAGNOSIS — I10 Essential (primary) hypertension: Secondary | ICD-10-CM | POA: Diagnosis not present

## 2015-08-02 DIAGNOSIS — J209 Acute bronchitis, unspecified: Secondary | ICD-10-CM | POA: Diagnosis not present

## 2015-08-02 DIAGNOSIS — Z8673 Personal history of transient ischemic attack (TIA), and cerebral infarction without residual deficits: Secondary | ICD-10-CM | POA: Diagnosis not present

## 2015-08-02 DIAGNOSIS — R0602 Shortness of breath: Secondary | ICD-10-CM | POA: Diagnosis not present

## 2015-08-02 DIAGNOSIS — Z86018 Personal history of other benign neoplasm: Secondary | ICD-10-CM | POA: Diagnosis not present

## 2015-08-02 DIAGNOSIS — R0600 Dyspnea, unspecified: Secondary | ICD-10-CM | POA: Diagnosis not present

## 2015-08-02 DIAGNOSIS — M1990 Unspecified osteoarthritis, unspecified site: Secondary | ICD-10-CM | POA: Diagnosis not present

## 2015-08-02 DIAGNOSIS — R000 Tachycardia, unspecified: Secondary | ICD-10-CM | POA: Diagnosis not present

## 2015-08-02 DIAGNOSIS — Z791 Long term (current) use of non-steroidal anti-inflammatories (NSAID): Secondary | ICD-10-CM | POA: Insufficient documentation

## 2015-08-02 DIAGNOSIS — R069 Unspecified abnormalities of breathing: Secondary | ICD-10-CM | POA: Diagnosis not present

## 2015-08-02 DIAGNOSIS — Z87891 Personal history of nicotine dependence: Secondary | ICD-10-CM | POA: Insufficient documentation

## 2015-08-02 DIAGNOSIS — Z85528 Personal history of other malignant neoplasm of kidney: Secondary | ICD-10-CM | POA: Diagnosis not present

## 2015-08-02 LAB — CBC WITH DIFFERENTIAL/PLATELET
BASOS ABS: 0 10*3/uL (ref 0.0–0.1)
Basophils Relative: 0 %
Eosinophils Absolute: 0.1 10*3/uL (ref 0.0–0.7)
Eosinophils Relative: 2 %
HEMATOCRIT: 41.6 % (ref 39.0–52.0)
Hemoglobin: 13.9 g/dL (ref 13.0–17.0)
LYMPHS PCT: 36 %
Lymphs Abs: 1 10*3/uL (ref 0.7–4.0)
MCH: 31 pg (ref 26.0–34.0)
MCHC: 33.4 g/dL (ref 30.0–36.0)
MCV: 92.7 fL (ref 78.0–100.0)
Monocytes Absolute: 0.4 10*3/uL (ref 0.1–1.0)
Monocytes Relative: 13 %
NEUTROS ABS: 1.4 10*3/uL — AB (ref 1.7–7.7)
Neutrophils Relative %: 49 %
PLATELETS: 95 10*3/uL — AB (ref 150–400)
RBC: 4.49 MIL/uL (ref 4.22–5.81)
RDW: 13.1 % (ref 11.5–15.5)
WBC: 2.8 10*3/uL — ABNORMAL LOW (ref 4.0–10.5)

## 2015-08-02 LAB — BASIC METABOLIC PANEL
ANION GAP: 11 (ref 5–15)
BUN: 19 mg/dL (ref 6–20)
CO2: 19 mmol/L — ABNORMAL LOW (ref 22–32)
Calcium: 9.1 mg/dL (ref 8.9–10.3)
Chloride: 107 mmol/L (ref 101–111)
Creatinine, Ser: 1.2 mg/dL (ref 0.61–1.24)
GFR calc non Af Amer: 59 mL/min — ABNORMAL LOW (ref >60)
Glucose, Bld: 123 mg/dL — ABNORMAL HIGH (ref 65–99)
POTASSIUM: 3.8 mmol/L (ref 3.5–5.1)
SODIUM: 137 mmol/L (ref 135–145)

## 2015-08-02 LAB — I-STAT TROPONIN, ED: TROPONIN I, POC: 0 ng/mL (ref 0.00–0.08)

## 2015-08-02 LAB — D-DIMER, QUANTITATIVE (NOT AT ARMC): D-Dimer, Quant: 0.58 ug/mL-FEU — ABNORMAL HIGH (ref 0.00–0.50)

## 2015-08-02 LAB — BRAIN NATRIURETIC PEPTIDE: B Natriuretic Peptide: 98.2 pg/mL (ref 0.0–100.0)

## 2015-08-02 MED ORDER — BENZONATATE 100 MG PO CAPS
200.0000 mg | ORAL_CAPSULE | Freq: Once | ORAL | Status: AC
  Administered 2015-08-02: 200 mg via ORAL
  Filled 2015-08-02: qty 2

## 2015-08-02 MED ORDER — PROMETHAZINE-CODEINE 6.25-10 MG/5ML PO SYRP
5.0000 mL | ORAL_SOLUTION | Freq: Once | ORAL | Status: AC
  Administered 2015-08-02: 5 mL via ORAL
  Filled 2015-08-02: qty 5

## 2015-08-02 MED ORDER — ALBUTEROL SULFATE HFA 108 (90 BASE) MCG/ACT IN AERS
2.0000 | INHALATION_SPRAY | Freq: Once | RESPIRATORY_TRACT | Status: AC
  Administered 2015-08-02: 2 via RESPIRATORY_TRACT
  Filled 2015-08-02: qty 6.7

## 2015-08-02 MED ORDER — GUAIFENESIN-CODEINE 100-10 MG/5ML PO SYRP: 10.0000 mL | ORAL_SOLUTION | Freq: Three times a day (TID) | ORAL | Status: DC | PRN

## 2015-08-02 MED ORDER — KETOROLAC TROMETHAMINE 30 MG/ML IJ SOLN
30.0000 mg | Freq: Once | INTRAMUSCULAR | Status: AC
  Administered 2015-08-02: 30 mg via INTRAVENOUS
  Filled 2015-08-02: qty 1

## 2015-08-02 NOTE — ED Provider Notes (Signed)
CSN: [id number]     Arrival date & time 08/02/15  1128 History   First MD Initiated Contact with Patient 08/02/15 1137     Chief Complaint  Patient presents with  . Shortness of Breath  . Cough     (Consider location/radiation/quality/duration/timing/severity/associated sxs/prior Treatment) HPI Comments: 75 year old male with past medical history including CAD, hypertension, renal cell carcinoma, CVA who presents with cough and shortness of breath. The patient has had 7-8 days of cough that has not improved despite using over-the-counter medications like Mucinex. He reports a gradually worsening four-day history of shortness of breath. His shortness of breath is worse at night and he reports associated sweats. Today he went to his PCP, where he was noted to be short of breath and EMS was called. He received Solu-Medrol and DuoNeb in transport. He denies any vomiting, diarrhea, or chest pain. He has had some nausea. No sick contacts. He reports a headache just associated with the cough.  Patient is a 75 y.o. male presenting with shortness of breath and cough. The history is provided by the patient.  Shortness of Breath Associated symptoms: cough   Cough Associated symptoms: shortness of breath     Past Medical History  Diagnosis Date  . Hypertension   . CAD (coronary artery disease)     s/p cath in 2014 showing significant stenosis in the diagonal side branches and in the RV marginal branch. These vessels were small and diffusely diseased. He is managed medically.   . Gout   . GERD (gastroesophageal reflux disease)   . Osteoarthritis   . Renal cell carcinoma   . Adenomatous polyp   . Disc disease, degenerative, cervical   . Syncope and collapse   . Hyperlipidemia   . Anxiety   . History of back surgery   . Stroke [facility])     MINI STROKE 15+ YRS AGO    Past Surgical History  Procedure Laterality Date  . Carpal tunnel release    . Lumbar fusion    . Nephrectomy    . Replacement  total knee    . Cardiac catheterization  12/14/2008    ef 50-55%. SHOWED SIGNIFICANT STENOSIS AND TO DIAGONAL SIDE BRANCHES AND IN THE RIGHT VENTRICULAR MARGINAL BRANCH. THESE VESSELS WERE SMALL AND DIFFUSELY DISEASED  . Polypectomy    . Back surgery    . US echocardiography  12/15/2008    EF 60-65%  . US echocardiography  09/28/2004    EF 55-60%  . Cardiovascular stress test  12/10/2009    EF 61%. EKG negative. Mild peri ischemia. Managed medically.   . Cervical spine surgery      5 YRS AGO  . Joint replacement      RIGHT SHOULDER  . Total knee arthroplasty  06/08/2011    Procedure: TOTAL KNEE ARTHROPLASTY;  Surgeon: [name], MD;  Location: [place];  Service: Orthopedics;  Laterality: Left;  . Left heart catheterization with coronary angiogram N/A 08/09/2012    Procedure: LEFT HEART CATHETERIZATION WITH CORONARY ANGIOGRAM;  Surgeon: Peter M Martinique, MD;  Location: [facility] CATH LAB;  Service: Cardiovascular;  Laterality: N/A;   Family History  Problem Relation Age of Onset  . Heart attack Father   . Heart disease Brother    Social History  Substance Use Topics  . Smoking status: Former Smoker    Quit date: 05/31/1989  . Smokeless tobacco: None  . Alcohol Use: 0.0 oz/week    0 Standard drinks or equivalent per week  Review of Systems  Respiratory: Positive for cough and shortness of breath.    10 Systems reviewed and are negative for acute change except as noted in the HPI.  Allergies  Adhesive and Latex  Home Medications   Prior to Admission medications   Medication Sig Start Date End Date Taking? Authorizing Provider  acyclovir (ZOVIRAX) 400 MG tablet Take 1 tablet by mouth 2 (two) times daily. 08/08/14  Yes Historical Provider, MD  allopurinol (ZYLOPRIM) 300 MG tablet Take 300 mg by mouth daily.   Yes Historical Provider, MD  atorvastatin (LIPITOR) 80 MG tablet Take 80 mg by mouth daily.   Yes Historical Provider, MD  celecoxib (CELEBREX) 200 MG capsule Take 200 mg by mouth  daily.   Yes Historical Provider, MD  clopidogrel (PLAVIX) 75 MG tablet Take 1 tablet (75 mg total) by mouth daily. 06/28/13  Yes Peter M Martinique, MD  colchicine 0.6 MG tablet Take 0.6 mg by mouth daily.   Yes Historical Provider, MD  fluticasone (FLOVENT DISKUS) 50 MCG/BLIST diskus inhaler Inhale 1 puff into the lungs as needed.   Yes Historical Provider, MD  hydrochlorothiazide (MICROZIDE) 12.5 MG capsule Take 1 capsule (12.5 mg total) by mouth daily. 09/12/14  Yes Peter M Martinique, MD  HYDROcodone-acetaminophen (NORCO/VICODIN) 5-325 MG per tablet Take 1 tablet by mouth every 6 (six) hours as needed. 08/10/14  Yes Historical Provider, MD  LORazepam (ATIVAN) 0.5 MG tablet Take 0.5 mg by mouth daily as needed. For anxiety   Yes Historical Provider, MD  metoprolol (LOPRESSOR) 50 MG tablet Take 50 mg by mouth 2 (two) times daily.   Yes Historical Provider, MD  pantoprazole (PROTONIX) 40 MG tablet Take 40 mg by mouth daily.   Yes Historical Provider, MD  guaiFENesin-codeine (CHERATUSSIN AC) 100-10 MG/5ML syrup Take 10 mLs by mouth 3 (three) times daily as needed for cough. 08/02/15   Wenda Overland Fanchon Papania, MD   BP 94/79 mmHg  Pulse 90  Temp(Src) 98.3 F (36.8 C) (Oral)  Resp 21  SpO2 95% Physical Exam  Constitutional: He is oriented to person, place, and time. He appears well-developed and well-nourished. No distress.  Uncomfortable, frequent cough  HENT:  Head: Normocephalic and atraumatic.  Moist mucous membranes  Eyes: Conjunctivae are normal. Pupils are equal, round, and reactive to light.  Neck: Neck supple.  Cardiovascular: Regular rhythm and normal heart sounds.  Tachycardia present.   No murmur heard. Pulmonary/Chest: No respiratory distress. He has wheezes.  Very mildly increased WOB w/ diminished BS and exp wheezes in b/l bases  Abdominal: Soft. Bowel sounds are normal. He exhibits no distension. There is no tenderness.  Musculoskeletal: He exhibits no edema.  Neurological: He is alert  and oriented to person, place, and time.  Fluent speech  Skin: Skin is warm and dry.  Psychiatric: He has a normal mood and affect. Judgment normal.  Nursing note and vitals reviewed.   ED Course  Procedures (including critical care time) Labs Review Labs Reviewed  BASIC METABOLIC PANEL - Abnormal; Notable for the following:    CO2 19 (*)    Glucose, Bld 123 (*)    GFR calc non Af Amer 59 (*)    All other components within normal limits  CBC WITH DIFFERENTIAL/PLATELET - Abnormal; Notable for the following:    WBC 2.8 (*)    Platelets 95 (*)    Neutro Abs 1.4 (*)    All other components within normal limits  D-DIMER, QUANTITATIVE (NOT AT [facility]) - Abnormal; Notable  for the following:    D-Dimer, Quant 0.58 (*)    All other components within normal limits  BRAIN NATRIURETIC PEPTIDE  I-STAT TROPOININ, ED    Imaging Review Dg Chest 2 View  08/02/2015  CLINICAL DATA:  Cough, shortness of breath EXAM: CHEST  2 VIEW COMPARISON:  08/07/2012 FINDINGS: Lungs are clear.  No pleural effusion or pneumothorax. The heart is normal in size. Degenerative changes of the visualized thoracolumbar spine. Cervical spine fixation hardware. IMPRESSION: No evidence of acute cardiopulmonary disease. Electronically Signed   By: Julian Hy M.D.   On: 08/02/2015 12:38   I have personally reviewed and evaluated these lab results as part of my medical decision-making.   EKG Interpretation   Date/Time:  Sunday August 02 2015 11:36:51 EDT Ventricular Rate:  97 PR Interval:  153 QRS Duration: 98 QT Interval:  352 QTC Calculation: 447 R Axis:   44 Text Interpretation:  Sinus rhythm No significant change since last  tracing Confirmed by Finnian Husted MD, Caira Poche [id number]) on 08/02/2015 11:53:35 AM     Medications  benzonatate (TESSALON) capsule 200 mg (200 mg Oral Given 08/02/15 1216)  albuterol (PROVENTIL HFA;VENTOLIN HFA) 108 (90 Base) MCG/ACT inhaler 2 puff (2 puffs Inhalation Given 08/02/15 1404)   promethazine-codeine (PHENERGAN with CODEINE) 6.25-10 MG/5ML syrup 5 mL (5 mLs Oral Given 08/02/15 1428)  ketorolac (TORADOL) 30 MG/ML injection 30 mg (30 mg Intravenous Given 08/02/15 1404)    MDM   Final diagnoses:  Cough  Viral syndrome   Pt sent from doctor's office w/ 1 week of cough associated w/ 4 days of progressively worsening shortness of breath. On exam, he was uncomfortable but in no acute distress. Vital signs notable for mild hypertension. He did have some faint expiratory wheezes and diminished breath sounds in bilateral bases. He denies any history of lung disease. Solu-Medrol and DuoNeb given prior to arrival. Gave the patient Tessalon for his cough and Tylenol for headache. Obtained above lab work including BNP and troponin. Also obtained chest x-ray.  Labwork shows normal BNP and troponin, unremarkable CBC and BMP, d-dimer of 0.58 which is age adjusted for the patient is below the cutoff of 0.72. Given the cough and sweats, I have low suspicion for PE. On reexamination after receiving Tessalon and later Phenergan with codeine, the patient stated that he felt much better. Also gave Toradol for headache. He does not have any hx of lung disease thus I do not feel he needs ongoing steroids and given clear CXR and no fevers, I do not feel he needs antibiotics. I provided him with an inhaler and instructed to use only if it improves his cough. I have discussed supportive care and provided him with cough medication to use at home. He has an appointment with his PCP this week and I have emphasized the importance of follow-up. Also extensively reviewed return cautions including any worsening shortness of breath or new symptoms. The patient voiced understanding and was discharged in satisfactory condition.  Sharlett Iles, MD 08/02/15 [phone]

## 2015-08-02 NOTE — ED Notes (Signed)
Called lab and requested they add D-dimer to blood already sent.

## 2015-08-02 NOTE — Discharge Instructions (Signed)

## 2015-08-02 NOTE — ED Notes (Signed)
Patient comes from [place] with complaints of SOB and cough. Per EMS patient has wheezing noted. [facility] gave 125 Sol medrol  And a total of 10 albuterol 1 Atrovent . Patient Alert and oriented x4 on arrival able to move all extremities. Patient denies any Chest pain n/v.

## 2015-08-07 DIAGNOSIS — Z91018 Allergy to other foods: Secondary | ICD-10-CM | POA: Diagnosis not present

## 2015-08-07 DIAGNOSIS — I10 Essential (primary) hypertension: Secondary | ICD-10-CM | POA: Diagnosis not present

## 2015-08-07 DIAGNOSIS — M5136 Other intervertebral disc degeneration, lumbar region: Secondary | ICD-10-CM | POA: Diagnosis not present

## 2015-08-07 DIAGNOSIS — F419 Anxiety disorder, unspecified: Secondary | ICD-10-CM | POA: Diagnosis not present

## 2015-08-07 DIAGNOSIS — R05 Cough: Secondary | ICD-10-CM | POA: Diagnosis not present

## 2015-08-07 DIAGNOSIS — E785 Hyperlipidemia, unspecified: Secondary | ICD-10-CM | POA: Diagnosis not present

## 2015-08-10 ENCOUNTER — Ambulatory Visit: Payer: Self-pay | Admitting: Allergy and Immunology

## 2015-08-14 DIAGNOSIS — M25511 Pain in right shoulder: Secondary | ICD-10-CM | POA: Diagnosis not present

## 2015-08-14 DIAGNOSIS — M545 Low back pain: Secondary | ICD-10-CM | POA: Diagnosis not present

## 2015-08-14 DIAGNOSIS — M542 Cervicalgia: Secondary | ICD-10-CM | POA: Diagnosis not present

## 2015-08-17 ENCOUNTER — Encounter: Payer: Self-pay | Admitting: Allergy and Immunology

## 2015-08-17 ENCOUNTER — Ambulatory Visit (INDEPENDENT_AMBULATORY_CARE_PROVIDER_SITE_OTHER): Payer: PPO | Admitting: Allergy and Immunology

## 2015-08-17 VITALS — BP 140/80 | HR 90 | Temp 98.2°F | Resp 20 | Ht 68.31 in | Wt 225.1 lb

## 2015-08-17 DIAGNOSIS — T783XXA Angioneurotic edema, initial encounter: Secondary | ICD-10-CM

## 2015-08-17 DIAGNOSIS — R062 Wheezing: Secondary | ICD-10-CM | POA: Diagnosis not present

## 2015-08-17 NOTE — Assessment & Plan Note (Addendum)
Jake Dunn's history suggests asthma. However, spirometry today reveals partial reversibility but does not meet ATS criteria for that diagnosis.  Continue albuterol HFA, 1-2 inhalations every 4-6 hours as needed.  During respiratory tract infections and lower respiratory symptom flares, the patient may add Qvar 80 g, 2 inhalations twice a day until symptoms have returned to baseline.  To maximize pulmonary deposition, a spacer has been provided along with instructions for its proper administration with an HFA inhaler.  Subjective and objective measures of pulmonary function will be followed and the treatment plan will be adjusted accordingly.

## 2015-08-17 NOTE — Assessment & Plan Note (Signed)
Angioedema vs allergic reactions.  Continue avoidance of strong perfumes and scented candles.  The following labs have been ordered: C4, C1 esterase inhibitor (quantitative and functional), C1q.  A journal is to be kept recording any foods eaten, beverages consumed, medications taken within a 6 hour period prior to the onset of symptoms, as well as record activities being performed, and environmental conditions. For any symptoms concerning for anaphylaxis, epinephrine is to be administered and 911 is to be called immediately.

## 2015-08-17 NOTE — Progress Notes (Signed)
New Patient Note  RE: Jake Dunn MRN: [id number] DOB: 06-16-1940 Date of Office Visit: 08/17/2015  Referring provider: Harlan Stains, MD Primary care provider: Vidal Schwalbe, MD  Chief Complaint: Cough; Wheezing; and Angioedema   History of present illness: HPI Comments: Jake Dunn is a 75 y.o. male presenting today for consultation of coughing, wheezing, and angioedema.  He is accompanied by his wife who assists with the history.  Approximately 5 weeks ago he had an upper respiratory tract infection, however after the symptoms of the upper respiratory tract infection resolved experienced a persistent, nonproductive, "hacking" cough.  Gradually he began to experience wheezing along with the coughing.  These lower respiratory symptoms lasted for approximately 3 weeks when his wife decided to take him to urgent care for treatment.  He was transferred from urgent care to the emergency department and was given IM corticosteroids and breathing treatments.  The next day he followed up with his primary care physician and was prescribed prednisone and albuterol HFA.  Coughing and wheezing began to resolve with this treatment and he has been relatively symptom-free over the past week. He also complains of occasional tongue swelling.  He states that 2 or 3 years ago he developed tongue swelling after eating a "[employer]" yeast roll.  Since that time, he has experienced occasional swelling of the tongue, typically associated with strong aromas such as perfumes and scented candles.  He has an epinephrine autoinjector.  He denies nasal or sinus symptoms in the presence of strong aromas.   Assessment and plan: Coughing/wheezing [name]'s history suggests asthma. However, spirometry today reveals partial reversibility but does not meet ATS criteria for that diagnosis.  Continue albuterol HFA, 1-2 inhalations every 4-6 hours as needed.  During respiratory tract infections and lower respiratory  symptom flares, the patient may add Qvar 80 g, 2 inhalations twice a day until symptoms have returned to baseline.  To maximize pulmonary deposition, a spacer has been provided along with instructions for its proper administration with an HFA inhaler.  Subjective and objective measures of pulmonary function will be followed and the treatment plan will be adjusted accordingly.  Angioedema Angioedema vs allergic reactions.  Continue avoidance of strong perfumes and scented candles.  The following labs have been ordered: C4, C1 esterase inhibitor (quantitative and functional), C1q.  A journal is to be kept recording any foods eaten, beverages consumed, medications taken within a 6 hour period prior to the onset of symptoms, as well as record activities being performed, and environmental conditions. For any symptoms concerning for anaphylaxis, epinephrine is to be administered and 911 is to be called immediately.   Diagnositics: Spirometry: FVC was 3.46 L and FEV1 was 2.79 L with 260 mL (9%) postbronchodilator improvement. Environmental skin testing: Negative despite a positive histamine control. Food allergen skin testing: Negative despite a positive histamine control.    Physical examination: Blood pressure 140/80, pulse 90, temperature 98.2 F (36.8 C), temperature source Oral, resp. rate 20, height 5' 8.31" (1.735 m), weight 225 lb 1.4 oz (102.1 kg).  General: Alert, interactive, in no acute distress. HEENT: TMs pearly gray, turbinates mildly edematous without discharge, post-pharynx mildly erythematous. Neck: Supple without lymphadenopathy. Lungs: Clear to auscultation without wheezing, rhonchi or rales. CV: Normal S1, S2 without murmurs. Abdomen: Nondistended, nontender. Skin: Warm and dry, without lesions or rashes. Extremities:  No clubbing, cyanosis or edema. Neuro:   Grossly intact.  Review of systems:  Review of Systems  Constitutional: Negative for fever, chills and  weight loss.  HENT: Negative for congestion and nosebleeds.   Eyes: Negative for blurred vision.  Respiratory: Positive for cough and wheezing. Negative for hemoptysis.   Cardiovascular: Negative for chest pain.  Gastrointestinal: Negative for diarrhea and constipation.  Genitourinary: Negative for dysuria.  Musculoskeletal: Negative for myalgias and joint pain.  Skin: Negative for itching and rash.  Neurological: Negative for dizziness.  Endo/Heme/Allergies: Does not bruise/bleed easily.    Past medical history:  Past Medical History  Diagnosis Date  . Hypertension   . CAD (coronary artery disease)     s/p cath in 2014 showing significant stenosis in the diagonal side branches and in the RV marginal branch. These vessels were small and diffusely diseased. He is managed medically.   . Gout   . GERD (gastroesophageal reflux disease)   . Osteoarthritis   . Renal cell carcinoma   . Adenomatous polyp   . Disc disease, degenerative, cervical   . Syncope and collapse   . Hyperlipidemia   . Anxiety   . History of back surgery   . Stroke [facility])     MINI STROKE 15+ YRS AGO     Past surgical history:  Past Surgical History  Procedure Laterality Date  . Carpal tunnel release    . Lumbar fusion    . Nephrectomy    . Replacement total knee    . Cardiac catheterization  12/14/2008    ef 50-55%. SHOWED SIGNIFICANT STENOSIS AND TO DIAGONAL SIDE BRANCHES AND IN THE RIGHT VENTRICULAR MARGINAL BRANCH. THESE VESSELS WERE SMALL AND DIFFUSELY DISEASED  . Polypectomy    . Back surgery    . US echocardiography  12/15/2008    EF 60-65%  . US echocardiography  09/28/2004    EF 55-60%  . Cardiovascular stress test  12/10/2009    EF 61%. EKG negative. Mild peri ischemia. Managed medically.   . Cervical spine surgery      5 YRS AGO  . Joint replacement      RIGHT SHOULDER  . Total knee arthroplasty  06/08/2011    Procedure: TOTAL KNEE ARTHROPLASTY;  Surgeon: [name], MD;  Location: [place];   Service: Orthopedics;  Laterality: Left;  . Left heart catheterization with coronary angiogram N/A 08/09/2012    Procedure: LEFT HEART CATHETERIZATION WITH CORONARY ANGIOGRAM;  Surgeon: Peter M Martinique, MD;  Location: [facility] CATH LAB;  Service: Cardiovascular;  Laterality: N/A;  . Total shoulder replacement Right     Family history: Family History  Problem Relation Age of Onset  . Heart attack Father   . Heart disease Brother   . Allergic rhinitis Neg Hx   . Angioedema Neg Hx   . Asthma Neg Hx   . Atopy Neg Hx   . Eczema Neg Hx   . Immunodeficiency Neg Hx   . Urticaria Neg Hx     Social history: Social History   Social History  . Marital Status: Married    Spouse Name: N/A  . Number of Children: N/A  . Years of Education: N/A   Occupational History  . Not on file.   Social History Main Topics  . Smoking status: Former Smoker    Quit date: 05/31/1989  . Smokeless tobacco: Not on file  . Alcohol Use: 0.0 oz/week    0 Standard drinks or equivalent per week  . Drug Use: No  . Sexual Activity: Not Currently   Other Topics Concern  . Not on file   Social History Narrative  Environmental History: The patient lives in a recently remodeled house with hardwood floors throughout, gas heat, and central air.  He is a nonsmoker without pets.    Medication List       This list is accurate as of: 08/17/15  1:11 PM.  Always use your most recent med list.               acyclovir 400 MG tablet  Commonly known as:  ZOVIRAX  Take 1 tablet by mouth 2 (two) times daily. Reported on 08/17/2015     allopurinol 300 MG tablet  Commonly known as:  ZYLOPRIM  Take 300 mg by mouth daily.     atorvastatin 80 MG tablet  Commonly known as:  LIPITOR     celecoxib 200 MG capsule  Commonly known as:  CELEBREX  Take 200 mg by mouth daily.     diphenhydrAMINE 25 mg capsule  Commonly known as:  BENADRYL  Take 25 mg by mouth every 6 (six) hours as needed.     EPINEPHrine 0.3 mg/0.3 mL  Soaj injection  Commonly known as:  EPI-PEN  USE AS DIRECTED IF LIFE THREATENING REACTION OCCURS     guaiFENesin-codeine 100-10 MG/5ML syrup  Commonly known as:  CHERATUSSIN AC  Take 10 mLs by mouth 3 (three) times daily as needed for cough.     hydrochlorothiazide 12.5 MG capsule  Commonly known as:  MICROZIDE  Take 1 capsule (12.5 mg total) by mouth daily.     HYDROcodone-acetaminophen 5-325 MG tablet  Commonly known as:  NORCO/VICODIN  Take 1 tablet by mouth every 6 (six) hours as needed. Reported on 08/17/2015     LORazepam 0.5 MG tablet  Commonly known as:  ATIVAN  Take 0.5 mg by mouth 2 (two) times daily as needed.     metoprolol 50 MG tablet  Commonly known as:  LOPRESSOR  Take 50 mg by mouth 2 (two) times daily.     pantoprazole 40 MG tablet  Commonly known as:  PROTONIX  Take 40 mg by mouth daily.     PROVENTIL HFA 108 (90 Base) MCG/ACT inhaler  Generic drug:  albuterol  Inhale 2 puffs into the lungs every 4 (four) hours as needed for wheezing or shortness of breath.     valsartan 320 MG tablet  Commonly known as:  DIOVAN  Take 320 mg by mouth daily.        Known medication allergies: Allergies  Allergen Reactions  . Adhesive [Tape] Rash    Paper tape okay  . Latex Rash    I appreciate the opportunity to take part in this Jake Dunn's care. Please do not hesitate to contact me with questions.  Sincerely,   R. Edgar Frisk, MD

## 2015-08-17 NOTE — Patient Instructions (Addendum)
Coughing/wheezing Jake Dunn's history suggests asthma. However, spirometry today reveals partial reversibility but does not meet ATS criteria for that diagnosis.  Continue albuterol HFA, 1-2 inhalations every 4-6 hours as needed.  During respiratory tract infections and lower respiratory symptom flares, the patient may add Qvar 80 g, 2 inhalations twice a day until symptoms have returned to baseline.  To maximize pulmonary deposition, a spacer has been provided along with instructions for its proper administration with an HFA inhaler.  Subjective and objective measures of pulmonary function will be followed and the treatment plan will be adjusted accordingly.  Angioedema Angioedema vs allergic reactions.  Continue avoidance of strong perfumes and scented candles.  The following labs have been ordered: C4, C1 esterase inhibitor (quantitative and functional), C1q.  A journal is to be kept recording any foods eaten, beverages consumed, medications taken within a 6 hour period prior to the onset of symptoms, as well as record activities being performed, and environmental conditions. For any symptoms concerning for anaphylaxis, epinephrine is to be administered and 911 is to be called immediately.    Return in about 2 months (around 10/17/2015), or if symptoms worsen or fail to improve.

## 2015-08-24 ENCOUNTER — Ambulatory Visit (INDEPENDENT_AMBULATORY_CARE_PROVIDER_SITE_OTHER): Payer: PPO | Admitting: Cardiology

## 2015-08-24 ENCOUNTER — Encounter: Payer: Self-pay | Admitting: Cardiology

## 2015-08-24 ENCOUNTER — Other Ambulatory Visit: Payer: Self-pay | Admitting: Family Medicine

## 2015-08-24 VITALS — BP 166/100 | HR 82 | Ht 68.0 in | Wt 226.1 lb

## 2015-08-24 DIAGNOSIS — E785 Hyperlipidemia, unspecified: Secondary | ICD-10-CM | POA: Diagnosis not present

## 2015-08-24 DIAGNOSIS — I2510 Atherosclerotic heart disease of native coronary artery without angina pectoris: Secondary | ICD-10-CM

## 2015-08-24 DIAGNOSIS — M542 Cervicalgia: Secondary | ICD-10-CM

## 2015-08-24 DIAGNOSIS — I10 Essential (primary) hypertension: Secondary | ICD-10-CM | POA: Diagnosis not present

## 2015-08-24 DIAGNOSIS — M25511 Pain in right shoulder: Secondary | ICD-10-CM | POA: Diagnosis not present

## 2015-08-24 NOTE — Patient Instructions (Signed)
Continue your current therapy  I will see you in 6 months.   
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
## 2015-08-25 ENCOUNTER — Other Ambulatory Visit: Payer: Self-pay | Admitting: Family Medicine

## 2015-08-25 ENCOUNTER — Ambulatory Visit
Admission: RE | Admit: 2015-08-25 | Discharge: 2015-08-25 | Disposition: A | Discharge status: Home / Self Care | Payer: PPO | Source: Ambulatory Visit | Attending: Family Medicine | Admitting: Family Medicine

## 2015-08-25 DIAGNOSIS — S4991XA Unspecified injury of right shoulder and upper arm, initial encounter: Secondary | ICD-10-CM | POA: Diagnosis not present

## 2015-08-25 DIAGNOSIS — M25511 Pain in right shoulder: Secondary | ICD-10-CM | POA: Diagnosis not present

## 2015-08-25 DIAGNOSIS — M4322 Fusion of spine, cervical region: Secondary | ICD-10-CM | POA: Diagnosis not present

## 2015-08-25 DIAGNOSIS — M542 Cervicalgia: Secondary | ICD-10-CM

## 2015-09-07 DIAGNOSIS — M25511 Pain in right shoulder: Secondary | ICD-10-CM | POA: Diagnosis not present

## 2015-09-07 DIAGNOSIS — M542 Cervicalgia: Secondary | ICD-10-CM | POA: Diagnosis not present

## 2015-09-11 DIAGNOSIS — M25511 Pain in right shoulder: Secondary | ICD-10-CM | POA: Diagnosis not present

## 2015-09-11 DIAGNOSIS — M542 Cervicalgia: Secondary | ICD-10-CM | POA: Diagnosis not present

## 2015-09-14 DIAGNOSIS — M19011 Primary osteoarthritis, right shoulder: Secondary | ICD-10-CM | POA: Diagnosis not present

## 2015-09-14 DIAGNOSIS — M6281 Muscle weakness (generalized): Secondary | ICD-10-CM | POA: Diagnosis not present

## 2015-09-14 DIAGNOSIS — M25611 Stiffness of right shoulder, not elsewhere classified: Secondary | ICD-10-CM | POA: Diagnosis not present

## 2015-09-14 DIAGNOSIS — M25511 Pain in right shoulder: Secondary | ICD-10-CM | POA: Diagnosis not present

## 2015-09-16 DIAGNOSIS — M542 Cervicalgia: Secondary | ICD-10-CM | POA: Diagnosis not present

## 2015-09-16 DIAGNOSIS — M25511 Pain in right shoulder: Secondary | ICD-10-CM | POA: Diagnosis not present

## 2015-09-21 DIAGNOSIS — M542 Cervicalgia: Secondary | ICD-10-CM | POA: Diagnosis not present

## 2015-09-21 DIAGNOSIS — M25511 Pain in right shoulder: Secondary | ICD-10-CM | POA: Diagnosis not present

## 2015-09-23 DIAGNOSIS — M542 Cervicalgia: Secondary | ICD-10-CM | POA: Diagnosis not present

## 2015-09-23 DIAGNOSIS — M25511 Pain in right shoulder: Secondary | ICD-10-CM | POA: Diagnosis not present

## 2015-09-25 DIAGNOSIS — M25562 Pain in left knee: Secondary | ICD-10-CM | POA: Diagnosis not present

## 2015-09-25 DIAGNOSIS — M25512 Pain in left shoulder: Secondary | ICD-10-CM | POA: Diagnosis not present

## 2015-09-25 DIAGNOSIS — M25511 Pain in right shoulder: Secondary | ICD-10-CM | POA: Diagnosis not present

## 2015-09-25 DIAGNOSIS — M542 Cervicalgia: Secondary | ICD-10-CM | POA: Diagnosis not present

## 2015-10-16 DIAGNOSIS — M25511 Pain in right shoulder: Secondary | ICD-10-CM | POA: Diagnosis not present

## 2015-10-20 ENCOUNTER — Ambulatory Visit: Payer: PPO | Admitting: Allergy and Immunology

## 2015-10-28 DIAGNOSIS — M25511 Pain in right shoulder: Secondary | ICD-10-CM | POA: Diagnosis not present

## 2015-10-30 DIAGNOSIS — M25511 Pain in right shoulder: Secondary | ICD-10-CM | POA: Diagnosis not present

## 2015-11-16 DIAGNOSIS — G8918 Other acute postprocedural pain: Secondary | ICD-10-CM | POA: Diagnosis not present

## 2015-11-16 DIAGNOSIS — M19011 Primary osteoarthritis, right shoulder: Secondary | ICD-10-CM | POA: Diagnosis not present

## 2015-11-16 DIAGNOSIS — M7541 Impingement syndrome of right shoulder: Secondary | ICD-10-CM | POA: Diagnosis not present

## 2015-11-16 DIAGNOSIS — M75111 Incomplete rotator cuff tear or rupture of right shoulder, not specified as traumatic: Secondary | ICD-10-CM | POA: Diagnosis not present

## 2015-11-16 DIAGNOSIS — M94211 Chondromalacia, right shoulder: Secondary | ICD-10-CM | POA: Diagnosis not present

## 2015-11-16 DIAGNOSIS — M7501 Adhesive capsulitis of right shoulder: Secondary | ICD-10-CM | POA: Diagnosis not present

## 2015-11-16 DIAGNOSIS — M24111 Other articular cartilage disorders, right shoulder: Secondary | ICD-10-CM | POA: Diagnosis not present

## 2015-11-18 DIAGNOSIS — M25611 Stiffness of right shoulder, not elsewhere classified: Secondary | ICD-10-CM | POA: Diagnosis not present

## 2015-11-18 DIAGNOSIS — M7541 Impingement syndrome of right shoulder: Secondary | ICD-10-CM | POA: Diagnosis not present

## 2015-11-18 DIAGNOSIS — M25511 Pain in right shoulder: Secondary | ICD-10-CM | POA: Diagnosis not present

## 2015-11-18 DIAGNOSIS — S46011D Strain of muscle(s) and tendon(s) of the rotator cuff of right shoulder, subsequent encounter: Secondary | ICD-10-CM | POA: Diagnosis not present

## 2015-11-19 ENCOUNTER — Telehealth: Payer: Self-pay

## 2015-11-19 NOTE — Telephone Encounter (Signed)
Received surgical clearance from [facility] for right rotator cuff repair.Dr.Jordan advised ok to hold Plavix 7 days prior to surgery.Form faxed back to fax # [phone].

## 2015-11-24 DIAGNOSIS — M7541 Impingement syndrome of right shoulder: Secondary | ICD-10-CM | POA: Diagnosis not present

## 2015-11-24 DIAGNOSIS — M25511 Pain in right shoulder: Secondary | ICD-10-CM | POA: Diagnosis not present

## 2015-11-24 DIAGNOSIS — S46011D Strain of muscle(s) and tendon(s) of the rotator cuff of right shoulder, subsequent encounter: Secondary | ICD-10-CM | POA: Diagnosis not present

## 2015-11-24 DIAGNOSIS — M25611 Stiffness of right shoulder, not elsewhere classified: Secondary | ICD-10-CM | POA: Diagnosis not present

## 2015-11-26 DIAGNOSIS — M7541 Impingement syndrome of right shoulder: Secondary | ICD-10-CM | POA: Diagnosis not present

## 2015-11-26 DIAGNOSIS — M25611 Stiffness of right shoulder, not elsewhere classified: Secondary | ICD-10-CM | POA: Diagnosis not present

## 2015-11-26 DIAGNOSIS — M25511 Pain in right shoulder: Secondary | ICD-10-CM | POA: Diagnosis not present

## 2015-11-26 DIAGNOSIS — S46011D Strain of muscle(s) and tendon(s) of the rotator cuff of right shoulder, subsequent encounter: Secondary | ICD-10-CM | POA: Diagnosis not present

## 2015-11-30 DIAGNOSIS — M25511 Pain in right shoulder: Secondary | ICD-10-CM | POA: Diagnosis not present

## 2015-11-30 DIAGNOSIS — M7541 Impingement syndrome of right shoulder: Secondary | ICD-10-CM | POA: Diagnosis not present

## 2015-11-30 DIAGNOSIS — S46011D Strain of muscle(s) and tendon(s) of the rotator cuff of right shoulder, subsequent encounter: Secondary | ICD-10-CM | POA: Diagnosis not present

## 2015-11-30 DIAGNOSIS — M25611 Stiffness of right shoulder, not elsewhere classified: Secondary | ICD-10-CM | POA: Diagnosis not present

## 2015-12-02 DIAGNOSIS — M25511 Pain in right shoulder: Secondary | ICD-10-CM | POA: Diagnosis not present

## 2015-12-02 DIAGNOSIS — S46011D Strain of muscle(s) and tendon(s) of the rotator cuff of right shoulder, subsequent encounter: Secondary | ICD-10-CM | POA: Diagnosis not present

## 2015-12-02 DIAGNOSIS — M25611 Stiffness of right shoulder, not elsewhere classified: Secondary | ICD-10-CM | POA: Diagnosis not present

## 2015-12-02 DIAGNOSIS — M7541 Impingement syndrome of right shoulder: Secondary | ICD-10-CM | POA: Diagnosis not present

## 2015-12-09 DIAGNOSIS — S46011D Strain of muscle(s) and tendon(s) of the rotator cuff of right shoulder, subsequent encounter: Secondary | ICD-10-CM | POA: Diagnosis not present

## 2015-12-09 DIAGNOSIS — M25611 Stiffness of right shoulder, not elsewhere classified: Secondary | ICD-10-CM | POA: Diagnosis not present

## 2015-12-09 DIAGNOSIS — M25511 Pain in right shoulder: Secondary | ICD-10-CM | POA: Diagnosis not present

## 2015-12-09 DIAGNOSIS — M7541 Impingement syndrome of right shoulder: Secondary | ICD-10-CM | POA: Diagnosis not present

## 2015-12-11 DIAGNOSIS — M25511 Pain in right shoulder: Secondary | ICD-10-CM | POA: Diagnosis not present

## 2015-12-11 DIAGNOSIS — M7541 Impingement syndrome of right shoulder: Secondary | ICD-10-CM | POA: Diagnosis not present

## 2015-12-11 DIAGNOSIS — M25611 Stiffness of right shoulder, not elsewhere classified: Secondary | ICD-10-CM | POA: Diagnosis not present

## 2015-12-11 DIAGNOSIS — S46011D Strain of muscle(s) and tendon(s) of the rotator cuff of right shoulder, subsequent encounter: Secondary | ICD-10-CM | POA: Diagnosis not present

## 2015-12-14 DIAGNOSIS — S46011D Strain of muscle(s) and tendon(s) of the rotator cuff of right shoulder, subsequent encounter: Secondary | ICD-10-CM | POA: Diagnosis not present

## 2015-12-14 DIAGNOSIS — M7541 Impingement syndrome of right shoulder: Secondary | ICD-10-CM | POA: Diagnosis not present

## 2015-12-14 DIAGNOSIS — M25511 Pain in right shoulder: Secondary | ICD-10-CM | POA: Diagnosis not present

## 2015-12-14 DIAGNOSIS — M25611 Stiffness of right shoulder, not elsewhere classified: Secondary | ICD-10-CM | POA: Diagnosis not present

## 2015-12-16 DIAGNOSIS — M7541 Impingement syndrome of right shoulder: Secondary | ICD-10-CM | POA: Diagnosis not present

## 2015-12-16 DIAGNOSIS — S46011D Strain of muscle(s) and tendon(s) of the rotator cuff of right shoulder, subsequent encounter: Secondary | ICD-10-CM | POA: Diagnosis not present

## 2015-12-16 DIAGNOSIS — M25511 Pain in right shoulder: Secondary | ICD-10-CM | POA: Diagnosis not present

## 2015-12-16 DIAGNOSIS — M25611 Stiffness of right shoulder, not elsewhere classified: Secondary | ICD-10-CM | POA: Diagnosis not present

## 2015-12-21 DIAGNOSIS — S46011D Strain of muscle(s) and tendon(s) of the rotator cuff of right shoulder, subsequent encounter: Secondary | ICD-10-CM | POA: Diagnosis not present

## 2015-12-21 DIAGNOSIS — M25611 Stiffness of right shoulder, not elsewhere classified: Secondary | ICD-10-CM | POA: Diagnosis not present

## 2015-12-21 DIAGNOSIS — M7541 Impingement syndrome of right shoulder: Secondary | ICD-10-CM | POA: Diagnosis not present

## 2015-12-21 DIAGNOSIS — M25511 Pain in right shoulder: Secondary | ICD-10-CM | POA: Diagnosis not present

## 2015-12-23 DIAGNOSIS — M7541 Impingement syndrome of right shoulder: Secondary | ICD-10-CM | POA: Diagnosis not present

## 2015-12-23 DIAGNOSIS — M25511 Pain in right shoulder: Secondary | ICD-10-CM | POA: Diagnosis not present

## 2015-12-23 DIAGNOSIS — S46011D Strain of muscle(s) and tendon(s) of the rotator cuff of right shoulder, subsequent encounter: Secondary | ICD-10-CM | POA: Diagnosis not present

## 2015-12-23 DIAGNOSIS — M25611 Stiffness of right shoulder, not elsewhere classified: Secondary | ICD-10-CM | POA: Diagnosis not present

## 2015-12-28 DIAGNOSIS — M25511 Pain in right shoulder: Secondary | ICD-10-CM | POA: Diagnosis not present

## 2015-12-28 DIAGNOSIS — M25611 Stiffness of right shoulder, not elsewhere classified: Secondary | ICD-10-CM | POA: Diagnosis not present

## 2015-12-28 DIAGNOSIS — M7541 Impingement syndrome of right shoulder: Secondary | ICD-10-CM | POA: Diagnosis not present

## 2015-12-28 DIAGNOSIS — S46011D Strain of muscle(s) and tendon(s) of the rotator cuff of right shoulder, subsequent encounter: Secondary | ICD-10-CM | POA: Diagnosis not present

## 2015-12-30 DIAGNOSIS — M25511 Pain in right shoulder: Secondary | ICD-10-CM | POA: Diagnosis not present

## 2016-01-12 DIAGNOSIS — M7541 Impingement syndrome of right shoulder: Secondary | ICD-10-CM | POA: Diagnosis not present

## 2016-01-12 DIAGNOSIS — M25611 Stiffness of right shoulder, not elsewhere classified: Secondary | ICD-10-CM | POA: Diagnosis not present

## 2016-01-12 DIAGNOSIS — M25511 Pain in right shoulder: Secondary | ICD-10-CM | POA: Diagnosis not present

## 2016-01-12 DIAGNOSIS — S46011D Strain of muscle(s) and tendon(s) of the rotator cuff of right shoulder, subsequent encounter: Secondary | ICD-10-CM | POA: Diagnosis not present

## 2016-02-09 DIAGNOSIS — M19012 Primary osteoarthritis, left shoulder: Secondary | ICD-10-CM | POA: Diagnosis not present

## 2016-02-09 DIAGNOSIS — M19011 Primary osteoarthritis, right shoulder: Secondary | ICD-10-CM | POA: Diagnosis not present

## 2016-02-10 ENCOUNTER — Telehealth: Payer: Self-pay

## 2016-02-10 NOTE — Telephone Encounter (Signed)
Received surgical clearance from [place] requesting cardiac clearance for upcoming right total shoulder replacement.Message sent to [place] for clearance.

## 2016-02-11 NOTE — Telephone Encounter (Signed)
Need to verify if ok to hold Plavix prior to surgery and how many days to hold.Message sent to [facility] for advice.

## 2016-02-11 NOTE — Telephone Encounter (Signed)
Spoke to patient Dr.Jordan cleared you for upcoming right shoulder surgery,ok to hold plavix 5 days prior to surgery.Clearance form faxed back to [facility] at fax # [phone].

## 2016-02-11 NOTE — Telephone Encounter (Signed)
May hold Plavix for 5 days prior to surgery.  Peter Martinique MD, [facility]

## 2016-02-11 NOTE — Telephone Encounter (Signed)
He is clear for surgery from a cardiac standpoint.  Janis Sol Martinique MD, [facility]

## 2016-03-08 DIAGNOSIS — M5030 Other cervical disc degeneration, unspecified cervical region: Secondary | ICD-10-CM | POA: Diagnosis not present

## 2016-03-08 DIAGNOSIS — L989 Disorder of the skin and subcutaneous tissue, unspecified: Secondary | ICD-10-CM | POA: Diagnosis not present

## 2016-03-08 DIAGNOSIS — G894 Chronic pain syndrome: Secondary | ICD-10-CM | POA: Diagnosis not present

## 2016-03-08 DIAGNOSIS — I10 Essential (primary) hypertension: Secondary | ICD-10-CM | POA: Diagnosis not present

## 2016-03-08 DIAGNOSIS — M109 Gout, unspecified: Secondary | ICD-10-CM | POA: Diagnosis not present

## 2016-03-08 DIAGNOSIS — E785 Hyperlipidemia, unspecified: Secondary | ICD-10-CM | POA: Diagnosis not present

## 2016-03-08 DIAGNOSIS — M75111 Incomplete rotator cuff tear or rupture of right shoulder, not specified as traumatic: Secondary | ICD-10-CM | POA: Diagnosis not present

## 2016-03-08 DIAGNOSIS — F419 Anxiety disorder, unspecified: Secondary | ICD-10-CM | POA: Diagnosis not present

## 2016-03-08 DIAGNOSIS — M5136 Other intervertebral disc degeneration, lumbar region: Secondary | ICD-10-CM | POA: Diagnosis not present

## 2016-03-08 DIAGNOSIS — Z8673 Personal history of transient ischemic attack (TIA), and cerebral infarction without residual deficits: Secondary | ICD-10-CM | POA: Diagnosis not present

## 2016-03-08 DIAGNOSIS — Z125 Encounter for screening for malignant neoplasm of prostate: Secondary | ICD-10-CM | POA: Diagnosis not present

## 2016-03-08 DIAGNOSIS — Z0000 Encounter for general adult medical examination without abnormal findings: Secondary | ICD-10-CM | POA: Diagnosis not present

## 2016-03-16 DIAGNOSIS — L570 Actinic keratosis: Secondary | ICD-10-CM | POA: Diagnosis not present

## 2016-03-16 DIAGNOSIS — X32XXXA Exposure to sunlight, initial encounter: Secondary | ICD-10-CM | POA: Diagnosis not present

## 2016-03-16 DIAGNOSIS — D225 Melanocytic nevi of trunk: Secondary | ICD-10-CM | POA: Diagnosis not present

## 2016-03-22 DIAGNOSIS — M19011 Primary osteoarthritis, right shoulder: Secondary | ICD-10-CM | POA: Diagnosis not present

## 2016-03-22 NOTE — H&P (Signed)
Jake Dunn comes in for follow up.  Right shoulder is getting worse rather than better.  I debrided his irreparable tear back a couple of months ago.  Unfortunately this has not afforded a lot of relief.  He continues to get rest pain, night pain and more and more functional impact.  We have already discussed what is going on and he fully understands that there are really no options, but to consider reverse total shoulder replacement.  We discussed this at some length again today and he wants to proceed.  Of note, his opposite left shoulder is now getting worse.  He had a large retracted irreparable cuff tear back in 2007.  This has been managed conservatively, but it is now getting the best of him because he can't even use his right shoulder.  He is wondering if we can inject his left shoulder today and go ahead and proceed with scheduling of his right reverse total shoulder.   History and previous treatment all reviewed.       EXAMINATION: General exam is outlined.  Lungs clear to auscultation bilaterally.  Heart sounds normal.  Specifically, on the right he has reasonably, not quite full, but fairly full passive motion.  Actively he can't even get to 90 degrees forward or with abduction.  Reasonable rotation.  On the left he has a good 80% of active and passive motion.  He has compensated for having no cuff on that side reasonably well, but he hurts at all end points.    DISPOSITION:  1. In regards to the right shoulder, we are going to go ahead with scheduling reverse total shoulder.  That procedure, risks, benefits and complications reviewed in detail.  More than 25 minutes spent face-to-face going over this with [name] and his wife.  He realizes he really has no choice if he wants to try to stay active.   2. Left shoulder he has the same situation, but just not as bad.  We are going to inject that today.  I haven't got recent workup or x-rays of that side and I am holding off doing anything on that side  until we get the right better.  He will let me know.    PROCEDURE NOTE: The patient's clinical condition is marked by substantial pain and/or significant functional disability.  Other conservative therapy has not provided relief, is contraindicated, or not appropriate.  There is a reasonable likelihood that injection will significantly improve the patient's pain and/or functional disability. After appropriate consent and under sterile technique the left shoulder was injected with 80 mg of Depo-Medrol and Marcaine.  As he has no cuff, this is both intraarticular and subacromial.  Tolerated this well.  I will see him at the time of operative intervention on the right.

## 2016-03-24 ENCOUNTER — Encounter (HOSPITAL_COMMUNITY): Payer: Self-pay

## 2016-03-24 NOTE — Pre-Procedure Instructions (Signed)
JOCK MEELER  03/24/2016      [id number] [address], [place] [address] [place] [address] Phone: [phone] Fax: [phone]  [facility] [place], [place] [address] Phone: [phone] Fax: [phone]    Your procedure is scheduled on Wednesday November 29.  Report to [facility] Admitting at 10:00 A.M.  Call this number if you have problems the morning of surgery:  [phone]   Remember:  Do not eat food or drink liquids after midnight.  Take these medicines the morning of surgery with A SIP OF WATER: metoprolol (lopressor), pantoprazole (protonix), acyclovir (Zovirax), allopurinol (Zyloprim), hydrocodone (Norco?vicodin) if needed, lorazepam (ativan) if needed  Stop Plavix 5 days prior to surgery per Dr. Martinique.   7 days prior to surgery STOP taking any Aspirin, Aleve, Naproxen, Ibuprofen, Motrin, Advil, Goody's, BC's, all herbal medications, fish oil, and all vitamins    Do not wear jewelry, make-up or nail polish.  Do not wear lotions, powders, or perfumes, or deoderant.  Do not shave 48 hours prior to surgery.  Men may shave face and neck.  Do not bring valuables to the hospital.  [facility] is not responsible for any belongings or valuables.  Contacts, dentures or bridgework may not be worn into surgery.  Leave your suitcase in the car.  After surgery it may be brought to your room.  For patients admitted to the hospital, discharge time will be determined by your treatment team.  Patients discharged the day of surgery will not be allowed to drive home.    Special instructions:    [facility]- Preparing For Surgery  Before surgery, you can play an important role. Because skin is not sterile, your skin needs to be as free of germs as possible. You can reduce the number of germs on your skin by washing with CHG  (chlorahexidine gluconate) Soap before surgery.  CHG is an antiseptic cleaner which kills germs and bonds with the skin to continue killing germs even after washing.  Please do not use if you have an allergy to CHG or antibacterial soaps. If your skin becomes reddened/irritated stop using the CHG.  Do not shave (including legs and underarms) for at least 48 hours prior to first CHG shower. It is OK to shave your face.  Please follow these instructions carefully.   1. Shower the NIGHT BEFORE SURGERY and the MORNING OF SURGERY with CHG.   2. If you chose to wash your hair, wash your hair first as usual with your normal shampoo.  3. After you shampoo, rinse your hair and body thoroughly to remove the shampoo.  4. Use CHG as you would any other liquid soap. You can apply CHG directly to the skin and wash gently with a scrungie or a clean washcloth.   5. Apply the CHG Soap to your body ONLY FROM THE NECK DOWN.  Do not use on open wounds or open sores. Avoid contact with your eyes, ears, mouth and genitals (private parts). Wash genitals (private parts) with your normal soap.  6. Wash thoroughly, paying special attention to the area where your surgery will be performed.  7. Thoroughly rinse your body with warm water from the neck down.  8. DO NOT shower/wash with your normal soap after using and rinsing off the CHG Soap.  9. Pat yourself dry with a CLEAN TOWEL.  10. Wear CLEAN PAJAMAS   11. Place CLEAN SHEETS on your bed the night of your first shower and DO NOT SLEEP WITH PETS.    Day of Surgery: Do not apply any deodorants/lotions. Please wear clean clothes to the hospital/surgery center.      Please read over the following fact sheets that you were given. MRSA Information

## 2016-03-25 ENCOUNTER — Encounter (HOSPITAL_COMMUNITY): Payer: Self-pay

## 2016-03-25 ENCOUNTER — Encounter (HOSPITAL_COMMUNITY)
Admission: RE | Admit: 2016-03-25 | Discharge: 2016-03-25 | Disposition: A | Discharge status: Home / Self Care | Payer: PPO | Source: Ambulatory Visit | Attending: Orthopedic Surgery | Admitting: Orthopedic Surgery

## 2016-03-25 DIAGNOSIS — Z01818 Encounter for other preprocedural examination: Secondary | ICD-10-CM | POA: Insufficient documentation

## 2016-03-25 LAB — COMPREHENSIVE METABOLIC PANEL
ALT: 15 U/L — AB (ref 17–63)
AST: 20 U/L (ref 15–41)
Albumin: 3.8 g/dL (ref 3.5–5.0)
Alkaline Phosphatase: 70 U/L (ref 38–126)
Anion gap: 8 (ref 5–15)
BUN: 24 mg/dL — ABNORMAL HIGH (ref 6–20)
CHLORIDE: 106 mmol/L (ref 101–111)
CO2: 23 mmol/L (ref 22–32)
CREATININE: 1.24 mg/dL (ref 0.61–1.24)
Calcium: 9.3 mg/dL (ref 8.9–10.3)
GFR, EST NON AFRICAN AMERICAN: 56 mL/min — AB (ref >60)
Glucose, Bld: 111 mg/dL — ABNORMAL HIGH (ref 65–99)
Potassium: 3.9 mmol/L (ref 3.5–5.1)
Sodium: 137 mmol/L (ref 135–145)
Total Bilirubin: 0.7 mg/dL (ref 0.3–1.2)
Total Protein: 7.1 g/dL (ref 6.5–8.1)

## 2016-03-25 LAB — CBC
HCT: 40.2 % (ref 39.0–52.0)
HEMOGLOBIN: 13.6 g/dL (ref 13.0–17.0)
MCH: 31.4 pg (ref 26.0–34.0)
MCHC: 33.8 g/dL (ref 30.0–36.0)
MCV: 92.8 fL (ref 78.0–100.0)
Platelets: 241 10*3/uL (ref 150–400)
RBC: 4.33 MIL/uL (ref 4.22–5.81)
RDW: 13.1 % (ref 11.5–15.5)
WBC: 6.6 10*3/uL (ref 4.0–10.5)

## 2016-03-25 LAB — TYPE AND SCREEN
ABO/RH(D): A POS
Antibody Screen: NEGATIVE

## 2016-03-25 LAB — SURGICAL PCR SCREEN
MRSA, PCR: NEGATIVE
Staphylococcus aureus: NEGATIVE

## 2016-03-25 NOTE — Progress Notes (Signed)
PCP: Harlan Stains Cardiologist: Dr. Martinique, pt with hx cardiac cath, cardiac clearance in epic, pt to stop plavix 5 days prior to surgery Echo: 2014 Stress test 2011 EKG and CXR 08/04/15  Pt with no complaints of chest pain, SOB or signs of infection at PAT appointment.

## 2016-03-28 NOTE — Progress Notes (Signed)
Anesthesia Chart Review:  Pt is a a 75 year old male scheduled for R total shoulder arthroplasty on 04/06/2016 with Kathryne Hitch, MD.   - Cardiologist is Peter Martinique, MD who cleared pt for surgery.  - PCP is Harlan Stains, MD.   PMH includes:  CAD, HTN, hyperlipidemia, TIA, renal cell carcinoma (s/p L nephrectomy). Former smoker. BMI 35. S/p L TKA 06/08/11.   Medications include: lipitor, plavix, hctz, metoprolol, protonix, valsartan. Pt to hold plavix 5 days before surgery.   Preoperative labs reviewed.    CXR 08/02/15: No evidence of acute cardiopulmonary disease.  EKG 08/02/15: sinus rhythm  Cardiac cath 08/09/12:  1. Single vessel obstructive CAD; there is moderate stenosis in the third diagonal. Otherwise nonobstructive disease.  2. Normal LV function.  3. Normal LV filling pressures.   Echo 08/07/12:  - Left ventricle: TECHNICALLY DIFFICULT STUDY. The cavity size was at the upper limits of normal. Wall thickness wasincreased in a pattern of mild LVH. The estimated ejectionfraction was 60%. Wall motion was normal; there were noregional wall motion abnormalities. Findings consistentwith left ventricular diastolic dysfunction. - Left atrium: The atrium was mildly dilated. - Right ventricle: The cavity size was mildly dilated. Systolic function was normal.  If no changes, I anticipate pt can proceed with surgery as scheduled.   Willeen Cass, FNP-BC [facility]/Anesthesiology Phone: [phone] 03/28/2016 3:37 PM

## 2016-04-06 ENCOUNTER — Encounter (HOSPITAL_COMMUNITY): Payer: Self-pay | Admitting: *Deleted

## 2016-04-06 ENCOUNTER — Inpatient Hospital Stay (HOSPITAL_COMMUNITY): Payer: PPO | Admitting: Anesthesiology

## 2016-04-06 ENCOUNTER — Inpatient Hospital Stay (HOSPITAL_COMMUNITY): Payer: PPO

## 2016-04-06 ENCOUNTER — Inpatient Hospital Stay (HOSPITAL_COMMUNITY): Payer: PPO | Admitting: Emergency Medicine

## 2016-04-06 ENCOUNTER — Encounter (HOSPITAL_COMMUNITY)
Admission: RE | Disposition: A | Discharge status: Home / Self Care | Payer: Self-pay | Source: Ambulatory Visit | Attending: Orthopedic Surgery

## 2016-04-06 ENCOUNTER — Inpatient Hospital Stay (HOSPITAL_COMMUNITY)
Admission: RE | Admit: 2016-04-06 | Discharge: 2016-04-06 | DRG: 483 | Disposition: A | Discharge status: Home / Self Care | Payer: PPO | Source: Ambulatory Visit | Attending: Orthopedic Surgery | Admitting: Orthopedic Surgery

## 2016-04-06 DIAGNOSIS — I2510 Atherosclerotic heart disease of native coronary artery without angina pectoris: Secondary | ICD-10-CM | POA: Diagnosis not present

## 2016-04-06 DIAGNOSIS — K219 Gastro-esophageal reflux disease without esophagitis: Secondary | ICD-10-CM | POA: Diagnosis present

## 2016-04-06 DIAGNOSIS — M25511 Pain in right shoulder: Secondary | ICD-10-CM | POA: Diagnosis not present

## 2016-04-06 DIAGNOSIS — M19011 Primary osteoarthritis, right shoulder: Secondary | ICD-10-CM | POA: Diagnosis not present

## 2016-04-06 DIAGNOSIS — Z79899 Other long term (current) drug therapy: Secondary | ICD-10-CM | POA: Diagnosis not present

## 2016-04-06 DIAGNOSIS — F419 Anxiety disorder, unspecified: Secondary | ICD-10-CM | POA: Diagnosis not present

## 2016-04-06 DIAGNOSIS — I10 Essential (primary) hypertension: Secondary | ICD-10-CM | POA: Diagnosis not present

## 2016-04-06 DIAGNOSIS — Z8673 Personal history of transient ischemic attack (TIA), and cerebral infarction without residual deficits: Secondary | ICD-10-CM | POA: Diagnosis not present

## 2016-04-06 DIAGNOSIS — G8918 Other acute postprocedural pain: Secondary | ICD-10-CM | POA: Diagnosis not present

## 2016-04-06 DIAGNOSIS — Z471 Aftercare following joint replacement surgery: Secondary | ICD-10-CM | POA: Diagnosis not present

## 2016-04-06 DIAGNOSIS — Z85528 Personal history of other malignant neoplasm of kidney: Secondary | ICD-10-CM

## 2016-04-06 DIAGNOSIS — Z96619 Presence of unspecified artificial shoulder joint: Secondary | ICD-10-CM

## 2016-04-06 DIAGNOSIS — Z87891 Personal history of nicotine dependence: Secondary | ICD-10-CM

## 2016-04-06 DIAGNOSIS — E785 Hyperlipidemia, unspecified: Secondary | ICD-10-CM | POA: Diagnosis not present

## 2016-04-06 DIAGNOSIS — Z96611 Presence of right artificial shoulder joint: Secondary | ICD-10-CM | POA: Diagnosis not present

## 2016-04-06 DIAGNOSIS — M109 Gout, unspecified: Secondary | ICD-10-CM | POA: Diagnosis not present

## 2016-04-06 HISTORY — PX: TOTAL SHOULDER ARTHROPLASTY: SHX126

## 2016-04-06 SURGERY — ARTHROPLASTY, SHOULDER, TOTAL
Anesthesia: Regional | Site: Shoulder | Laterality: Right

## 2016-04-06 MED ORDER — LACTATED RINGERS IV SOLN
INTRAVENOUS | Status: DC
  Administered 2016-04-06 (×2): via INTRAVENOUS

## 2016-04-06 MED ORDER — OXYCODONE-ACETAMINOPHEN 5-325 MG PO TABS: 1.0000 | ORAL_TABLET | ORAL | 0 refills | Status: DC | PRN

## 2016-04-06 MED ORDER — PROPOFOL 10 MG/ML IV BOLUS
INTRAVENOUS | Status: AC
  Filled 2016-04-06: qty 40

## 2016-04-06 MED ORDER — MIDAZOLAM HCL 5 MG/5ML IJ SOLN
INTRAMUSCULAR | Status: DC | PRN
  Administered 2016-04-06 (×2): 1 mg via INTRAVENOUS

## 2016-04-06 MED ORDER — DEXAMETHASONE SODIUM PHOSPHATE 10 MG/ML IJ SOLN
INTRAMUSCULAR | Status: DC | PRN
  Administered 2016-04-06: 10 mg via INTRAVENOUS

## 2016-04-06 MED ORDER — PHENYLEPHRINE HCL 10 MG/ML IJ SOLN
INTRAVENOUS | Status: DC | PRN
  Administered 2016-04-06: 35 ug/min via INTRAVENOUS

## 2016-04-06 MED ORDER — CHLORHEXIDINE GLUCONATE 4 % EX LIQD: 60.0000 mL | Freq: Once | CUTANEOUS | Status: DC

## 2016-04-06 MED ORDER — BUPIVACAINE-EPINEPHRINE (PF) 0.5% -1:200000 IJ SOLN
INTRAMUSCULAR | Status: DC | PRN
  Administered 2016-04-06: 25 mL via PERINEURAL

## 2016-04-06 MED ORDER — MIDAZOLAM HCL 2 MG/2ML IJ SOLN
INTRAMUSCULAR | Status: AC
  Administered 2016-04-06: 1 mg via INTRAVENOUS
  Filled 2016-04-06: qty 2

## 2016-04-06 MED ORDER — LIDOCAINE 2% (20 MG/ML) 5 ML SYRINGE
INTRAMUSCULAR | Status: AC
  Filled 2016-04-06: qty 5

## 2016-04-06 MED ORDER — LACTATED RINGERS IV SOLN
INTRAVENOUS | Status: DC | PRN
  Administered 2016-04-06 (×2): via INTRAVENOUS

## 2016-04-06 MED ORDER — PROMETHAZINE HCL 25 MG/ML IJ SOLN
6.2500 mg | INTRAMUSCULAR | Status: DC | PRN
Start: 2016-04-06 — End: 2016-04-06

## 2016-04-06 MED ORDER — MIDAZOLAM HCL 2 MG/2ML IJ SOLN
1.0000 mg | INTRAMUSCULAR | Status: DC | PRN
  Administered 2016-04-06: 1 mg via INTRAVENOUS

## 2016-04-06 MED ORDER — PHENYLEPHRINE HCL 10 MG/ML IJ SOLN
INTRAMUSCULAR | Status: DC | PRN
  Administered 2016-04-06 (×3): 80 ug via INTRAVENOUS

## 2016-04-06 MED ORDER — ONDANSETRON HCL 4 MG/2ML IJ SOLN
INTRAMUSCULAR | Status: AC
  Filled 2016-04-06: qty 2

## 2016-04-06 MED ORDER — GLYCOPYRROLATE 0.2 MG/ML IV SOSY
PREFILLED_SYRINGE | INTRAVENOUS | Status: AC
  Filled 2016-04-06: qty 3

## 2016-04-06 MED ORDER — DEXAMETHASONE SODIUM PHOSPHATE 10 MG/ML IJ SOLN
INTRAMUSCULAR | Status: AC
  Filled 2016-04-06: qty 1

## 2016-04-06 MED ORDER — ONDANSETRON HCL 4 MG/2ML IJ SOLN
INTRAMUSCULAR | Status: DC | PRN
  Administered 2016-04-06: 4 mg via INTRAVENOUS

## 2016-04-06 MED ORDER — PROPOFOL 10 MG/ML IV BOLUS
INTRAVENOUS | Status: DC | PRN
  Administered 2016-04-06: 10 mg via INTRAVENOUS
  Administered 2016-04-06: 160 mg via INTRAVENOUS
  Administered 2016-04-06 (×3): 10 mg via INTRAVENOUS

## 2016-04-06 MED ORDER — MIDAZOLAM HCL 2 MG/2ML IJ SOLN
INTRAMUSCULAR | Status: AC
  Filled 2016-04-06: qty 2

## 2016-04-06 MED ORDER — ROCURONIUM BROMIDE 10 MG/ML (PF) SYRINGE
PREFILLED_SYRINGE | INTRAVENOUS | Status: AC
  Filled 2016-04-06: qty 10

## 2016-04-06 MED ORDER — FENTANYL CITRATE (PF) 100 MCG/2ML IJ SOLN
50.0000 ug | INTRAMUSCULAR | Status: DC | PRN
  Administered 2016-04-06: 50 ug via INTRAVENOUS

## 2016-04-06 MED ORDER — EPHEDRINE SULFATE 50 MG/ML IJ SOLN
INTRAMUSCULAR | Status: DC | PRN
  Administered 2016-04-06 (×3): 5 mg via INTRAVENOUS

## 2016-04-06 MED ORDER — CEFAZOLIN SODIUM-DEXTROSE 2-4 GM/100ML-% IV SOLN
2.0000 g | INTRAVENOUS | Status: AC
  Administered 2016-04-06: 2 g via INTRAVENOUS
  Filled 2016-04-06: qty 100

## 2016-04-06 MED ORDER — ONDANSETRON HCL 4 MG PO TABS: 4.0000 mg | ORAL_TABLET | Freq: Three times a day (TID) | ORAL | 0 refills | Status: DC | PRN

## 2016-04-06 MED ORDER — SUGAMMADEX SODIUM 200 MG/2ML IV SOLN
INTRAVENOUS | Status: DC | PRN
  Administered 2016-04-06: 200 mg via INTRAVENOUS

## 2016-04-06 MED ORDER — ROCURONIUM BROMIDE 100 MG/10ML IV SOLN
INTRAVENOUS | Status: DC | PRN
  Administered 2016-04-06: 25 mg via INTRAVENOUS
  Administered 2016-04-06: 50 mg via INTRAVENOUS

## 2016-04-06 MED ORDER — PHENYLEPHRINE 40 MCG/ML (10ML) SYRINGE FOR IV PUSH (FOR BLOOD PRESSURE SUPPORT)
PREFILLED_SYRINGE | INTRAVENOUS | Status: AC
  Filled 2016-04-06: qty 10

## 2016-04-06 MED ORDER — LIDOCAINE HCL (CARDIAC) 20 MG/ML IV SOLN
INTRAVENOUS | Status: DC | PRN
  Administered 2016-04-06: 50 mg via INTRAVENOUS

## 2016-04-06 MED ORDER — SUGAMMADEX SODIUM 200 MG/2ML IV SOLN
INTRAVENOUS | Status: AC
  Filled 2016-04-06: qty 2

## 2016-04-06 MED ORDER — FENTANYL CITRATE (PF) 100 MCG/2ML IJ SOLN
INTRAMUSCULAR | Status: DC | PRN
  Administered 2016-04-06: 50 ug via INTRAVENOUS

## 2016-04-06 MED ORDER — FENTANYL CITRATE (PF) 100 MCG/2ML IJ SOLN
INTRAMUSCULAR | Status: AC
  Administered 2016-04-06: 50 ug via INTRAVENOUS
  Filled 2016-04-06: qty 2

## 2016-04-06 MED ORDER — HYDROMORPHONE HCL 1 MG/ML IJ SOLN: 0.2500 mg | INTRAMUSCULAR | Status: DC | PRN

## 2016-04-06 MED ORDER — 0.9 % SODIUM CHLORIDE (POUR BTL) OPTIME
TOPICAL | Status: DC | PRN
  Administered 2016-04-06: 1000 mL

## 2016-04-06 MED ORDER — FENTANYL CITRATE (PF) 100 MCG/2ML IJ SOLN
INTRAMUSCULAR | Status: AC
  Filled 2016-04-06: qty 4

## 2016-04-06 SURGICAL SUPPLY — 62 items
APL SKNCLS STERI-STRIP NONHPOA (GAUZE/BANDAGES/DRESSINGS) ×1
BENZOIN TINCTURE PRP APPL 2/3 (GAUZE/BANDAGES/DRESSINGS) ×2 IMPLANT
BIT DRILL 3.1 DIA REUNION (BIT) ×1 IMPLANT
BLADE SAW SGTL 83.5X18.5 (BLADE) ×2 IMPLANT
BOWL SMART MIX CTS (DISPOSABLE) IMPLANT
BRUSH FEMORAL CANAL (MISCELLANEOUS) IMPLANT
BUR SURG 4X8 MED (BURR) IMPLANT
BURR SURG 4X8 MED (BURR)
CAPT SHLDR REVTOTAL 2 ×2 IMPLANT
CLSR STERI-STRIP ANTIMIC 1/2X4 (GAUZE/BANDAGES/DRESSINGS) ×1 IMPLANT
COVER SURGICAL LIGHT HANDLE (MISCELLANEOUS) ×2 IMPLANT
DRAPE IMP U-DRAPE 54X76 (DRAPES) ×2 IMPLANT
DRAPE ORTHO SPLIT 77X108 STRL (DRAPES) ×4
DRAPE SURG ORHT 6 SPLT 77X108 (DRAPES) ×2 IMPLANT
DRAPE U-SHAPE 47X51 STRL (DRAPES) ×2 IMPLANT
DRSG AQUACEL AG ADV 3.5X10 (GAUZE/BANDAGES/DRESSINGS) ×2 IMPLANT
DURAPREP 26ML APPLICATOR (WOUND CARE) ×2 IMPLANT
ELECT CAUTERY BLADE 6.4 (BLADE) ×2 IMPLANT
ELECT REM PT RETURN 9FT ADLT (ELECTROSURGICAL) ×2
ELECTRODE REM PT RTRN 9FT ADLT (ELECTROSURGICAL) ×1 IMPLANT
EVACUATOR 1/8 PVC DRAIN (DRAIN) IMPLANT
FACESHIELD WRAPAROUND (MASK) ×4 IMPLANT
GLOVE BIOGEL PI IND STRL 7.0 (GLOVE) ×1 IMPLANT
GLOVE BIOGEL PI INDICATOR 7.0 (GLOVE) ×1
GLOVE ECLIPSE 7.0 STRL STRAW (GLOVE) ×2 IMPLANT
GLOVE ORTHO TXT STRL SZ7.5 (GLOVE) ×1 IMPLANT
GLOVE SURG SS PI 6.5 STRL IVOR (GLOVE) ×1 IMPLANT
GLOVE SURG SS PI 7.0 STRL IVOR (GLOVE) ×1 IMPLANT
GLOVE SURG SS PI 8.0 STRL IVOR (GLOVE) ×1 IMPLANT
GOWN STRL REUS W/ TWL LRG LVL3 (GOWN DISPOSABLE) ×2 IMPLANT
GOWN STRL REUS W/TWL LRG LVL3 (GOWN DISPOSABLE) ×4
HANDPIECE INTERPULSE COAX TIP (DISPOSABLE)
KIT BASIN OR (CUSTOM PROCEDURE TRAY) ×2 IMPLANT
KIT ROOM TURNOVER OR (KITS) ×2 IMPLANT
MANIFOLD NEPTUNE II (INSTRUMENTS) ×2 IMPLANT
NEEDLE HYPO 25GX1X1/2 BEV (NEEDLE) ×2 IMPLANT
NS IRRIG 1000ML POUR BTL (IV SOLUTION) ×2 IMPLANT
PACK SHOULDER (CUSTOM PROCEDURE TRAY) ×2 IMPLANT
PAD ARMBOARD 7.5X6 YLW CONV (MISCELLANEOUS) ×4 IMPLANT
REUNION RSA PILOT WIRE ×1 IMPLANT
SET HNDPC FAN SPRY TIP SCT (DISPOSABLE) IMPLANT
SHOULDER CAPITATED REVTOTAL 2 ×1 IMPLANT
SLING ARM IMMOBILIZER LRG (SOFTGOODS) ×2 IMPLANT
SLING ARM IMMOBILIZER XL (CAST SUPPLIES) IMPLANT
SPONGE LAP 18X18 X RAY DECT (DISPOSABLE) ×2 IMPLANT
STRIP CLOSURE SKIN 1/2X4 (GAUZE/BANDAGES/DRESSINGS) ×2 IMPLANT
SUCTION FRAZIER HANDLE 10FR (MISCELLANEOUS) ×1
SUCTION TUBE FRAZIER 10FR DISP (MISCELLANEOUS) ×1 IMPLANT
SUT FIBERWIRE #2 38 REV NDL BL (SUTURE) ×6
SUT FIBERWIRE #2 38 T-5 BLUE (SUTURE) ×4
SUT MNCRL AB 4-0 PS2 18 (SUTURE) ×2 IMPLANT
SUT MON AB 2-0 CT1 36 (SUTURE) ×2 IMPLANT
SUT VIC AB 0 CT1 27 (SUTURE) ×2
SUT VIC AB 0 CT1 27XBRD ANBCTR (SUTURE) ×1 IMPLANT
SUT VIC AB 2-0 CT1 27 (SUTURE) ×2
SUT VIC AB 2-0 CT1 TAPERPNT 27 (SUTURE) ×1 IMPLANT
SUTURE FIBERWR #2 38 T-5 BLUE (SUTURE) IMPLANT
SUTURE FIBERWR#2 38 REV NDL BL (SUTURE) ×2 IMPLANT
SYR CONTROL 10ML LL (SYRINGE) ×2 IMPLANT
TOWEL OR 17X24 6PK STRL BLUE (TOWEL DISPOSABLE) ×2 IMPLANT
TOWEL OR 17X26 10 PK STRL BLUE (TOWEL DISPOSABLE) ×2 IMPLANT
WATER STERILE IRR 1000ML POUR (IV SOLUTION) ×2 IMPLANT

## 2016-04-06 NOTE — Discharge Summary (Signed)
Patient ID: Jake Dunn MRN: [id number] DOB/AGE: June 27, 1940 75 y.o.  Admit date: 04/06/2016 Discharge date: 04/06/2016  Admission Diagnoses:  Active Problems:   * No active hospital problems. *   Discharge Diagnoses:  Same  Past Medical History:  Diagnosis Date  . Adenomatous polyp   . Anxiety   . CAD (coronary artery disease)    s/p cath in 2014 showing significant stenosis in the diagonal side branches and in the RV marginal branch. These vessels were small and diffusely diseased. He is managed medically.   . Disc disease, degenerative, cervical   . GERD (gastroesophageal reflux disease)   . Gout   . History of back surgery   . Hyperlipidemia   . Hypertension   . Osteoarthritis   . Renal cell carcinoma    left kidney removal  . Stroke ([facility] [place])    MINI STROKE 15+ YRS AGO, no residual  . Syncope and collapse     Surgeries: Procedure(s): RIGHT REVERSE TOTAL SHOULDER ARTHROPLASTY on 04/06/2016   Consultants:   Discharged Condition: Improved  Hospital Course: Jake Dunn is an 75 y.o. male who was admitted 04/06/2016 for operative treatment of primary localized osteoarthritis right shoulder. Patient has severe unremitting pain that affects sleep, daily activities, and work/hobbies. After pre-op clearance the patient was taken to the operating room on 04/06/2016 and underwent  Procedure(s): RIGHT REVERSE TOTAL SHOULDER ARTHROPLASTY.    Patient was given perioperative antibiotics: Anti-infectives    Start     Dose/Rate Route Frequency Ordered Stop   04/06/16 0907  ceFAZolin (ANCEF) IVPB 2g/100 mL premix     2 g 200 mL/hr over 30 Minutes Intravenous On call to O.R. 04/06/16 0907 04/06/16 1143       Patient was given sequential compression devices, early ambulation, and chemoprophylaxis to prevent DVT.  Patient benefited maximally from hospital stay and there were no complications.    Recent vital signs: Patient Vitals for the past 24 hrs:  BP Temp Temp src Pulse  Resp SpO2 Height Weight  04/06/16 1020 94/68 - - 70 20 100 % - -  04/06/16 1015 101/61 - - 68 16 99 % - -  04/06/16 1010 - - - 66 14 100 % - -  04/06/16 0919 113/66 98.5 F (36.9 C) Oral 66 20 98 % 5\' 7"  (1.702 m) 100.4 kg (221 lb 6.4 oz)     Recent laboratory studies: No results for input(s): WBC, HGB, HCT, PLT, NA, K, CL, CO2, BUN, CREATININE, GLUCOSE, INR, CALCIUM in the last 72 hours.  Invalid input(s): PT, 2   Discharge Medications:     Medication List    STOP taking these medications   HYDROcodone-acetaminophen 5-325 MG tablet Commonly known as:  NORCO/VICODIN     TAKE these medications   acyclovir 400 MG tablet Commonly known as:  ZOVIRAX Take 400 mg by mouth 2 (two) times daily. Reported on 08/17/2015   allopurinol 300 MG tablet Commonly known as:  ZYLOPRIM Take 300 mg by mouth daily.   atorvastatin 80 MG tablet Commonly known as:  LIPITOR Take 80 mg by mouth every evening.   clopidogrel 75 MG tablet Commonly known as:  PLAVIX Take 75 mg by mouth daily.   EPINEPHrine 0.3 mg/0.3 mL Soaj injection Commonly known as:  EPI-PEN USE AS DIRECTED IF LIFE THREATENING REACTION OCCURS   hydrochlorothiazide 12.5 MG capsule Commonly known as:  MICROZIDE Take 1 capsule (12.5 mg total) by mouth daily.   LORazepam 0.5 MG tablet Commonly known as:  ATIVAN Take 0.5 mg by mouth 2 (two) times daily as needed for anxiety.   metoprolol 50 MG tablet Commonly known as:  LOPRESSOR Take 50 mg by mouth 2 (two) times daily.   ondansetron 4 MG tablet Commonly known as:  ZOFRAN Take 1 tablet (4 mg total) by mouth every 8 (eight) hours as needed for nausea or vomiting.   oxyCODONE-acetaminophen 5-325 MG tablet Commonly known as:  PERCOCET Take 1-2 tablets by mouth every 4 (four) hours as needed for severe pain.   pantoprazole 40 MG tablet Commonly known as:  PROTONIX Take 40 mg by mouth daily.   valsartan 320 MG tablet Commonly known as:  DIOVAN Take 320 mg by mouth  daily.       Diagnostic Studies: No results found.  Disposition: 01-Home or Self Care    Follow-up Information    [name], MD. Schedule an appointment as soon as possible for a visit in 1 week.   Specialty:  Orthopedic Surgery Contact information: [address] [place] [address] [phone]            Signed: Fannie Knee 04/06/2016, 1:21 PM

## 2016-04-06 NOTE — Anesthesia Procedure Notes (Signed)
Anesthesia Regional Block:  Interscalene brachial plexus block  Pre-Anesthetic Checklist: ,, timeout performed, Correct Patient, Correct Site, Correct Laterality, Correct Procedure, Correct Position, site marked, Risks and benefits discussed,  Surgical consent,  Pre-op evaluation,  At surgeon's request and post-op pain management  Laterality: Right  Prep: chloraprep       Needles:  Injection technique: Single-shot  Needle Type: Echogenic Needle     Needle Length: 9cm 9 cm Needle Gauge: 21 and 21 G    Additional Needles:  Procedures: ultrasound guided (picture in chart) and nerve stimulator Interscalene brachial plexus block  Nerve Stimulator or Paresthesia:  Response: deltoid, 0.5 mA,   Additional Responses:   Narrative:  Start time: 04/06/2016 10:10 AM End time: 04/06/2016 10:17 AM Injection made incrementally with aspirations every 5 mL.  Performed by: Personally  Anesthesiologist: Suzette Battiest

## 2016-04-06 NOTE — Interval H&P Note (Signed)
History and Physical Interval Note:  04/06/2016 9:48 AM  Jake Dunn  has presented today for surgery, with the diagnosis of djd right shoulder  The various methods of treatment have been discussed with the patient and family. After consideration of risks, benefits and other options for treatment, the patient has consented to  Procedure(s): TOTAL SHOULDER ARTHROPLASTY (Right) as a surgical intervention .  The patient's history has been reviewed, patient examined, no change in status, stable for surgery.  I have reviewed the patient's chart and labs.  Questions were answered to the patient's satisfaction.     [name]

## 2016-04-06 NOTE — Anesthesia Procedure Notes (Signed)
Procedure Name: Intubation Date/Time: 04/06/2016 11:10 AM Performed by: Adalberto Ill Pre-anesthesia Checklist: Patient identified, Emergency Drugs available, Suction available, Patient being monitored and Timeout performed Patient Re-evaluated:Patient Re-evaluated prior to inductionOxygen Delivery Method: Circle system utilized Preoxygenation: Pre-oxygenation with 100% oxygen Intubation Type: IV induction Ventilation: Mask ventilation without difficulty Laryngoscope Size: Miller and 2 Grade View: Grade I Tube type: Oral Tube size: 7.0 mm Number of attempts: 1 Placement Confirmation: ETT inserted through vocal cords under direct vision,  positive ETCO2 and breath sounds checked- equal and bilateral Secured at: 23 cm Tube secured with: Tape

## 2016-04-06 NOTE — Anesthesia Preprocedure Evaluation (Addendum)
Anesthesia Evaluation  Patient identified by MRN, date of birth, ID band Patient awake    Reviewed: Allergy & Precautions, NPO status , Patient's Chart, lab work & pertinent test results, reviewed documented beta blocker date and time   Airway Mallampati: II  TM Distance: >3 FB Neck ROM: Full    Dental  (+) Dental Advisory Given   Pulmonary former smoker,    breath sounds clear to auscultation       Cardiovascular hypertension, Pt. on medications and Pt. on home beta blockers + CAD   Rhythm:Regular Rate:Normal     Neuro/Psych Anxiety CVA    GI/Hepatic Neg liver ROS, GERD  ,  Endo/Other  negative endocrine ROS  Renal/GU Renal disease (s/p L nephrectomy for RCC)     Musculoskeletal  (+) Arthritis ,   Abdominal   Peds  Hematology negative hematology ROS (+)   Anesthesia Other Findings   Reproductive/Obstetrics                            Lab Results  Component Value Date   WBC 6.6 03/25/2016   HGB 13.6 03/25/2016   HCT 40.2 03/25/2016   MCV 92.8 03/25/2016   PLT 241 03/25/2016   Lab Results  Component Value Date   CREATININE 1.24 03/25/2016   BUN 24 (H) 03/25/2016   NA 137 03/25/2016   K 3.9 03/25/2016   CL 106 03/25/2016   CO2 23 03/25/2016    Anesthesia Physical Anesthesia Plan  ASA: III  Anesthesia Plan: General and Regional   Post-op Pain Management:  Regional for Post-op pain   Induction: Intravenous  Airway Management Planned: Oral ETT  Additional Equipment:   Intra-op Plan:   Post-operative Plan: Extubation in OR  Informed Consent: I have reviewed the patients History and Physical, chart, labs and discussed the procedure including the risks, benefits and alternatives for the proposed anesthesia with the patient or authorized representative who has indicated his/her understanding and acceptance.   Dental advisory given  Plan Discussed with: CRNA  Anesthesia  Plan Comments:         Anesthesia Quick Evaluation

## 2016-04-06 NOTE — Discharge Instructions (Signed)
°  Care After Instructions Refer to this sheet in the next few weeks. These discharge instructions provide you with general information on caring for yourself after you leave the hospital. Your caregiver may also give you specific instructions. Your treatment has been planned according to the most current medical practices available, but unavoidable complications sometimes occur. If you have any problems or questions after discharge, please call your caregiver. HOME INSTRUCTIONS You may resume a normal diet and activities as directed.  Take showers instead of baths until informed otherwise.  Do not remove bandage. Only take over-the-counter or prescription medicines for pain, discomfort, or fever as directed by your caregiver.  Wear your sling for the next 6 weeks unless otherwise instructed. Eat a well-balanced diet.  Avoid lifting or driving until you are instructed otherwise.  Make an appointment to see your caregiver for stitches (suture) or staple removal one week after surgery.   SEEK MEDICAL CARE IF: You have swelling of your calf or leg.  You develop shortness of breath or chest pain.  You have redness, swelling, or increasing pain in the wound.  There is pus or any unusual drainage coming from the surgical site.  You notice a bad smell coming from the surgical site or dressing.  The surgical site breaks open after sutures or staples have been removed.  There is persistent bleeding from the suture or staple line.  You are getting worse or are not improving.  You have any other questions or concerns.  SEEK IMMEDIATE MEDICAL CARE IF:  You have a fever greater than 101 You develop a rash.  You have difficulty breathing.  You develop any reaction or side effects to medicines given.  Your knee motion is decreasing rather than improving.  MAKE SURE YOU:  Understand these instructions.  Will watch your condition.  Will get help right away if you are not doing well or get worse.

## 2016-04-06 NOTE — Progress Notes (Signed)
Patient ID: Jake Dunn, male   DOB: 1941/04/17, 75 y.o.   MRN: [id number]   [place] to d/c home today if patient feels ready

## 2016-04-06 NOTE — Anesthesia Postprocedure Evaluation (Signed)
Anesthesia Post Note  Patient: Jake Dunn  Procedure(s) Performed: Procedure(s) (LRB): RIGHT REVERSE TOTAL SHOULDER ARTHROPLASTY (Right)  Patient location during evaluation: PACU Anesthesia Type: General and Regional Level of consciousness: awake and alert Pain management: pain level controlled Vital Signs Assessment: post-procedure vital signs reviewed and stable Respiratory status: spontaneous breathing, nonlabored ventilation, respiratory function stable and patient connected to nasal cannula oxygen Cardiovascular status: blood pressure returned to baseline and stable Postop Assessment: no signs of nausea or vomiting Anesthetic complications: no    Last Vitals:  Vitals:   04/06/16 1345 04/06/16 1351  BP:  94/63  Pulse: 67 64  Resp: 17 16  Temp:      Last Pain:  Vitals:   04/06/16 1346  TempSrc:   PainSc: 0-No pain                 Tiajuana Amass

## 2016-04-06 NOTE — Transfer of Care (Signed)
Immediate Anesthesia Transfer of Care Note  Patient: Jake Dunn  Procedure(s) Performed: Procedure(s): RIGHT REVERSE TOTAL SHOULDER ARTHROPLASTY (Right)  Patient Location: PACU  Anesthesia Type:General and GA combined with regional for post-op pain  Level of Consciousness: awake, alert , oriented and patient cooperative  Airway & Oxygen Therapy: Patient Spontanous Breathing  Post-op Assessment: Report given to RN and Post -op Vital signs reviewed and stable  Post vital signs: Reviewed and stable  Last Vitals:  Vitals:   04/06/16 1020 04/06/16 1322  BP: 94/68   Pulse: 70   Resp: 20   Temp:  36.5 C    Last Pain:  Vitals:   04/06/16 0932  TempSrc:   PainSc: 7       Patients Stated Pain Goal: 5 ([id number])  Complications: No apparent anesthesia complications

## 2016-04-07 ENCOUNTER — Encounter (HOSPITAL_COMMUNITY): Payer: Self-pay | Admitting: Orthopedic Surgery

## 2016-04-07 NOTE — Op Note (Deleted)
  The note originally documented on this encounter has been moved the the encounter in which it belongs.  

## 2016-04-07 NOTE — Op Note (Signed)
Jake Dunn, FAUPEL NO.:  [id number]  MEDICAL RECORD NO.:  [id number]  LOCATION:  SDS                          FACILITY:  [facility]  PHYSICIAN:  [name], M.D. DATE OF BIRTH:  Nov 16, 1940  DATE OF PROCEDURE:  04/06/2016 DATE OF DISCHARGE:                              OPERATIVE REPORT   PREOPERATIVE DIAGNOSIS:  Right shoulder end-stage arthritis secondary to deficient rotator cuff and rotator cuff arthropathy.  POSTOPERATIVE DIAGNOSES:  Right shoulder end-stage arthritis secondary to deficient rotator cuff and rotator cuff arthropathy.  PROCEDURE:  Right reverse total shoulder replacement utilizing Stryker prosthesis.  A press-fit 28 mm glenoid baseplate.  Screw fixation x4. 36 mm glenosphere cap.  Press-fit 15 mm humeral stem with a 36 x 4 humeral cup and 36 x 4 humeral insert.  SURGEON:  [name], M.D.  ASSISTANT:  Elmyra Ricks, P.A., present throughout the entire case and necessary for timely completion of procedure.  ANESTHESIA:  General.  BLOOD LOSS:  150 mL.  BLOOD GIVEN:  None.  SPECIMENS:  None.  CULTURES:  None.  COMPLICATIONS:  None.  DRESSINGS:  Soft compressive, shoulder immobilizer.  DESCRIPTION OF PROCEDURE:  The patient brought to the operating room, and after adequate anesthesia had been obtained, placed in a beach-chair position with shoulder positioner, prepped and draped in usual sterile fashion.  Deltopectoral incision.  Retractors put in place.  Subscap taken down, retracted.  Glenoid exposed.  Brought up to good bleeding bone inferior aspect of the glenoid.  Fitted with a 28 mm baseplate with 4-screw fixation, which was excellent.  36 mm glenosphere cap attached. Humerus brought up to good fitting at 30 degrees of retroversion.  The humerus had been cut at the surgical neck at 30 degrees of retroversion. After broaches, a definitive prosthesis was inserted.  After trials, the definitive humeral cup 36 x 4  and then humeral insert 36 x 4 were inserted.  Shoulder reduced.  Great stability, full motion.  Copious irrigation.  Subscap repaired anatomically with FiberWire.  Deltopectoral interval allowed to close.  Subcutaneous with subcuticular closure.  Sterile compressive dressing applied.  Shoulder immobilizer applied.  Anesthesia reversed. Brought to recovery room.     [name], M.D.     DFM/MEDQ  D:  04/06/2016  T:  04/07/2016  Job:  [id number]

## 2016-04-07 NOTE — Op Note (Signed)
NAMEKURTIS, CORD NO.:  [id number]  MEDICAL RECORD NO.:  [id number]  LOCATION:                               FACILITY:  [facility]  PHYSICIAN:  [name], M.D. DATE OF BIRTH:  [birth year]  DATE OF PROCEDURE:  04/06/2016 DATE OF DISCHARGE:                              OPERATIVE REPORT   PREOPERATIVE DIAGNOSIS:  Right shoulder end-stage arthritis secondary to rotator cuff arthropathy, deficient rotator cuff.  POSTOPERATIVE DIAGNOSIS:  Right shoulder end-stage arthritis secondary to rotator cuff arthropathy, deficient rotator cuff.  PROCEDURE:  Reverse right total shoulder replacement utilizing Stryker prosthesis.  Press-fit 28 mm glenoid baseplate screw fixation x4.  36 mm glenosphere cup.  Press-fit 15 mm stem with a 36 x 4 mm humeral cup and 36 x 4 polyethylene insert.  SURGEON:  [name], M.D.  ASSISTANT:  Elmyra Ricks, P.A., present throughout the entire case and necessary for timely completion of procedure.  ANESTHESIA:  General.  BLOOD LOSS:  150 mL.  BLOOD GIVEN:  None.  SPECIMENS:  None.  CULTURES:  None.  COMPLICATIONS:  None.  DRESSINGS:  Soft compressive, shoulder immobilizer.  DESCRIPTION OF PROCEDURE:  The patient was brought to the operating room and after adequate anesthesia obtained placed in a beach-chair position with shoulder positioner and prepped and draped in usual sterile fashion.  Deltopectoral incision.  Interval opened, retractors put in place.  Subscap taken down and tagged with FiberWire.  Humerus had the humeral neck cut at 30 degrees of retroversion.  Glenoid exposed.  With reamers, it was brought up to good bleeding bone.  Fitted with a 28 mm baseplate at the inferior aspect of the glenoid.  Screw fixation x4, which was excellent.  36 mm glenosphere cap.  Humerus was brought up to good bleeding bone and fitting with broaches and hand-held reamers for a 15 mm stem.  After trials, that was inserted.   After trials, I used a 36 x 4 humeral cup and 36 x 4 insert.  With that construct, shoulder reduced, great stability, full passive motion.  Copious irrigation. Subscap repaired anatomically with FiberWire.  Deltopectoral interval closed.  Subcutaneous with subcuticular closure.  Margins were injected with Marcaine.  Sterile compressive dressing applied.  Shoulder immobilizer applied.  Anesthesia reversed.  Brought to the recovery room.  Tolerated the surgery well.  No complications.     [name], M.D.   ______________________________ [name], M.D.    DFM/MEDQ  D:  04/06/2016  T:  04/07/2016  Job:  [id number]

## 2016-04-19 DIAGNOSIS — M19011 Primary osteoarthritis, right shoulder: Secondary | ICD-10-CM | POA: Diagnosis not present

## 2016-04-27 DIAGNOSIS — M25511 Pain in right shoulder: Secondary | ICD-10-CM | POA: Diagnosis not present

## 2016-04-27 DIAGNOSIS — M19011 Primary osteoarthritis, right shoulder: Secondary | ICD-10-CM | POA: Diagnosis not present

## 2016-04-27 DIAGNOSIS — M25611 Stiffness of right shoulder, not elsewhere classified: Secondary | ICD-10-CM | POA: Diagnosis not present

## 2016-05-05 DIAGNOSIS — M25511 Pain in right shoulder: Secondary | ICD-10-CM | POA: Diagnosis not present

## 2016-05-05 DIAGNOSIS — M19011 Primary osteoarthritis, right shoulder: Secondary | ICD-10-CM | POA: Diagnosis not present

## 2016-05-05 DIAGNOSIS — M25611 Stiffness of right shoulder, not elsewhere classified: Secondary | ICD-10-CM | POA: Diagnosis not present

## 2016-05-13 DIAGNOSIS — M19011 Primary osteoarthritis, right shoulder: Secondary | ICD-10-CM | POA: Diagnosis not present

## 2016-05-13 DIAGNOSIS — M25611 Stiffness of right shoulder, not elsewhere classified: Secondary | ICD-10-CM | POA: Diagnosis not present

## 2016-05-13 DIAGNOSIS — M25511 Pain in right shoulder: Secondary | ICD-10-CM | POA: Diagnosis not present

## 2016-05-16 DIAGNOSIS — M25511 Pain in right shoulder: Secondary | ICD-10-CM | POA: Diagnosis not present

## 2016-05-16 DIAGNOSIS — M19011 Primary osteoarthritis, right shoulder: Secondary | ICD-10-CM | POA: Diagnosis not present

## 2016-05-16 DIAGNOSIS — M25611 Stiffness of right shoulder, not elsewhere classified: Secondary | ICD-10-CM | POA: Diagnosis not present

## 2016-05-17 DIAGNOSIS — M25511 Pain in right shoulder: Secondary | ICD-10-CM | POA: Diagnosis not present

## 2016-05-19 DIAGNOSIS — M19011 Primary osteoarthritis, right shoulder: Secondary | ICD-10-CM | POA: Diagnosis not present

## 2016-05-19 DIAGNOSIS — M25611 Stiffness of right shoulder, not elsewhere classified: Secondary | ICD-10-CM | POA: Diagnosis not present

## 2016-05-19 DIAGNOSIS — M25511 Pain in right shoulder: Secondary | ICD-10-CM | POA: Diagnosis not present

## 2016-05-24 DIAGNOSIS — M25611 Stiffness of right shoulder, not elsewhere classified: Secondary | ICD-10-CM | POA: Diagnosis not present

## 2016-05-24 DIAGNOSIS — M25511 Pain in right shoulder: Secondary | ICD-10-CM | POA: Diagnosis not present

## 2016-05-24 DIAGNOSIS — M19011 Primary osteoarthritis, right shoulder: Secondary | ICD-10-CM | POA: Diagnosis not present

## 2016-06-01 DIAGNOSIS — M25511 Pain in right shoulder: Secondary | ICD-10-CM | POA: Diagnosis not present

## 2016-06-01 DIAGNOSIS — M25611 Stiffness of right shoulder, not elsewhere classified: Secondary | ICD-10-CM | POA: Diagnosis not present

## 2016-06-01 DIAGNOSIS — M19011 Primary osteoarthritis, right shoulder: Secondary | ICD-10-CM | POA: Diagnosis not present

## 2016-06-03 DIAGNOSIS — M25511 Pain in right shoulder: Secondary | ICD-10-CM | POA: Diagnosis not present

## 2016-06-03 DIAGNOSIS — M25611 Stiffness of right shoulder, not elsewhere classified: Secondary | ICD-10-CM | POA: Diagnosis not present

## 2016-06-03 DIAGNOSIS — M19011 Primary osteoarthritis, right shoulder: Secondary | ICD-10-CM | POA: Diagnosis not present

## 2016-06-06 DIAGNOSIS — M25511 Pain in right shoulder: Secondary | ICD-10-CM | POA: Diagnosis not present

## 2016-06-06 DIAGNOSIS — M25611 Stiffness of right shoulder, not elsewhere classified: Secondary | ICD-10-CM | POA: Diagnosis not present

## 2016-06-06 DIAGNOSIS — M19011 Primary osteoarthritis, right shoulder: Secondary | ICD-10-CM | POA: Diagnosis not present

## 2016-06-07 DIAGNOSIS — I10 Essential (primary) hypertension: Secondary | ICD-10-CM | POA: Diagnosis not present

## 2016-06-07 DIAGNOSIS — M5136 Other intervertebral disc degeneration, lumbar region: Secondary | ICD-10-CM | POA: Diagnosis not present

## 2016-06-07 DIAGNOSIS — F1011 Alcohol abuse, in remission: Secondary | ICD-10-CM | POA: Diagnosis not present

## 2016-06-07 DIAGNOSIS — G894 Chronic pain syndrome: Secondary | ICD-10-CM | POA: Diagnosis not present

## 2016-06-07 DIAGNOSIS — F419 Anxiety disorder, unspecified: Secondary | ICD-10-CM | POA: Diagnosis not present

## 2016-06-13 DIAGNOSIS — M25611 Stiffness of right shoulder, not elsewhere classified: Secondary | ICD-10-CM | POA: Diagnosis not present

## 2016-06-13 DIAGNOSIS — M19011 Primary osteoarthritis, right shoulder: Secondary | ICD-10-CM | POA: Diagnosis not present

## 2016-06-13 DIAGNOSIS — M25511 Pain in right shoulder: Secondary | ICD-10-CM | POA: Diagnosis not present

## 2016-06-16 DIAGNOSIS — M25511 Pain in right shoulder: Secondary | ICD-10-CM | POA: Diagnosis not present

## 2016-06-16 DIAGNOSIS — M19011 Primary osteoarthritis, right shoulder: Secondary | ICD-10-CM | POA: Diagnosis not present

## 2016-06-16 DIAGNOSIS — M25611 Stiffness of right shoulder, not elsewhere classified: Secondary | ICD-10-CM | POA: Diagnosis not present

## 2016-06-20 DIAGNOSIS — M19011 Primary osteoarthritis, right shoulder: Secondary | ICD-10-CM | POA: Diagnosis not present

## 2016-06-20 DIAGNOSIS — M25611 Stiffness of right shoulder, not elsewhere classified: Secondary | ICD-10-CM | POA: Diagnosis not present

## 2016-06-20 DIAGNOSIS — M25511 Pain in right shoulder: Secondary | ICD-10-CM | POA: Diagnosis not present

## 2016-06-24 DIAGNOSIS — N5201 Erectile dysfunction due to arterial insufficiency: Secondary | ICD-10-CM | POA: Diagnosis not present

## 2016-06-24 DIAGNOSIS — Z125 Encounter for screening for malignant neoplasm of prostate: Secondary | ICD-10-CM | POA: Diagnosis not present

## 2016-06-27 DIAGNOSIS — M19011 Primary osteoarthritis, right shoulder: Secondary | ICD-10-CM | POA: Diagnosis not present

## 2016-06-27 DIAGNOSIS — M25611 Stiffness of right shoulder, not elsewhere classified: Secondary | ICD-10-CM | POA: Diagnosis not present

## 2016-06-27 DIAGNOSIS — M25511 Pain in right shoulder: Secondary | ICD-10-CM | POA: Diagnosis not present

## 2016-06-28 DIAGNOSIS — M19011 Primary osteoarthritis, right shoulder: Secondary | ICD-10-CM | POA: Diagnosis not present

## 2016-07-04 DIAGNOSIS — M25511 Pain in right shoulder: Secondary | ICD-10-CM | POA: Diagnosis not present

## 2016-07-04 DIAGNOSIS — M19011 Primary osteoarthritis, right shoulder: Secondary | ICD-10-CM | POA: Diagnosis not present

## 2016-07-04 DIAGNOSIS — M25611 Stiffness of right shoulder, not elsewhere classified: Secondary | ICD-10-CM | POA: Diagnosis not present

## 2016-07-07 DIAGNOSIS — M25611 Stiffness of right shoulder, not elsewhere classified: Secondary | ICD-10-CM | POA: Diagnosis not present

## 2016-07-07 DIAGNOSIS — M25511 Pain in right shoulder: Secondary | ICD-10-CM | POA: Diagnosis not present

## 2016-07-07 DIAGNOSIS — M19011 Primary osteoarthritis, right shoulder: Secondary | ICD-10-CM | POA: Diagnosis not present

## 2016-07-11 DIAGNOSIS — M19011 Primary osteoarthritis, right shoulder: Secondary | ICD-10-CM | POA: Diagnosis not present

## 2016-07-11 DIAGNOSIS — M25511 Pain in right shoulder: Secondary | ICD-10-CM | POA: Diagnosis not present

## 2016-07-11 DIAGNOSIS — M25611 Stiffness of right shoulder, not elsewhere classified: Secondary | ICD-10-CM | POA: Diagnosis not present

## 2016-07-14 DIAGNOSIS — M25611 Stiffness of right shoulder, not elsewhere classified: Secondary | ICD-10-CM | POA: Diagnosis not present

## 2016-07-14 DIAGNOSIS — M19011 Primary osteoarthritis, right shoulder: Secondary | ICD-10-CM | POA: Diagnosis not present

## 2016-07-14 DIAGNOSIS — M25511 Pain in right shoulder: Secondary | ICD-10-CM | POA: Diagnosis not present

## 2016-07-21 DIAGNOSIS — M25611 Stiffness of right shoulder, not elsewhere classified: Secondary | ICD-10-CM | POA: Diagnosis not present

## 2016-07-21 DIAGNOSIS — M19011 Primary osteoarthritis, right shoulder: Secondary | ICD-10-CM | POA: Diagnosis not present

## 2016-07-21 DIAGNOSIS — M25511 Pain in right shoulder: Secondary | ICD-10-CM | POA: Diagnosis not present

## 2016-07-25 DIAGNOSIS — M25511 Pain in right shoulder: Secondary | ICD-10-CM | POA: Diagnosis not present

## 2016-07-25 DIAGNOSIS — M25611 Stiffness of right shoulder, not elsewhere classified: Secondary | ICD-10-CM | POA: Diagnosis not present

## 2016-07-25 DIAGNOSIS — M19011 Primary osteoarthritis, right shoulder: Secondary | ICD-10-CM | POA: Diagnosis not present

## 2016-08-01 DIAGNOSIS — M25611 Stiffness of right shoulder, not elsewhere classified: Secondary | ICD-10-CM | POA: Diagnosis not present

## 2016-08-01 DIAGNOSIS — M25511 Pain in right shoulder: Secondary | ICD-10-CM | POA: Diagnosis not present

## 2016-08-01 DIAGNOSIS — M19011 Primary osteoarthritis, right shoulder: Secondary | ICD-10-CM | POA: Diagnosis not present

## 2016-08-08 DIAGNOSIS — M25511 Pain in right shoulder: Secondary | ICD-10-CM | POA: Diagnosis not present

## 2016-08-08 DIAGNOSIS — M19011 Primary osteoarthritis, right shoulder: Secondary | ICD-10-CM | POA: Diagnosis not present

## 2016-08-08 DIAGNOSIS — M25611 Stiffness of right shoulder, not elsewhere classified: Secondary | ICD-10-CM | POA: Diagnosis not present

## 2016-08-15 ENCOUNTER — Encounter (HOSPITAL_COMMUNITY): Payer: Self-pay | Admitting: General Practice

## 2016-08-15 ENCOUNTER — Emergency Department (HOSPITAL_COMMUNITY): Payer: PPO

## 2016-08-15 ENCOUNTER — Observation Stay (HOSPITAL_COMMUNITY)
Admission: EM | Admit: 2016-08-15 | Discharge: 2016-08-17 | Disposition: A | Discharge status: Home / Self Care | Payer: PPO | Attending: Cardiovascular Disease | Admitting: Cardiovascular Disease

## 2016-08-15 DIAGNOSIS — I700 Atherosclerosis of aorta: Secondary | ICD-10-CM | POA: Insufficient documentation

## 2016-08-15 DIAGNOSIS — K219 Gastro-esophageal reflux disease without esophagitis: Secondary | ICD-10-CM | POA: Insufficient documentation

## 2016-08-15 DIAGNOSIS — R072 Precordial pain: Secondary | ICD-10-CM

## 2016-08-15 DIAGNOSIS — Z955 Presence of coronary angioplasty implant and graft: Secondary | ICD-10-CM | POA: Diagnosis not present

## 2016-08-15 DIAGNOSIS — R0789 Other chest pain: Secondary | ICD-10-CM | POA: Diagnosis not present

## 2016-08-15 DIAGNOSIS — I25110 Atherosclerotic heart disease of native coronary artery with unstable angina pectoris: Principal | ICD-10-CM | POA: Insufficient documentation

## 2016-08-15 DIAGNOSIS — M109 Gout, unspecified: Secondary | ICD-10-CM | POA: Diagnosis not present

## 2016-08-15 DIAGNOSIS — M1990 Unspecified osteoarthritis, unspecified site: Secondary | ICD-10-CM | POA: Insufficient documentation

## 2016-08-15 DIAGNOSIS — E785 Hyperlipidemia, unspecified: Secondary | ICD-10-CM | POA: Insufficient documentation

## 2016-08-15 DIAGNOSIS — R079 Chest pain, unspecified: Secondary | ICD-10-CM | POA: Diagnosis not present

## 2016-08-15 DIAGNOSIS — Z85528 Personal history of other malignant neoplasm of kidney: Secondary | ICD-10-CM | POA: Insufficient documentation

## 2016-08-15 DIAGNOSIS — Z91040 Latex allergy status: Secondary | ICD-10-CM | POA: Insufficient documentation

## 2016-08-15 DIAGNOSIS — I200 Unstable angina: Secondary | ICD-10-CM | POA: Diagnosis not present

## 2016-08-15 DIAGNOSIS — I10 Essential (primary) hypertension: Secondary | ICD-10-CM | POA: Insufficient documentation

## 2016-08-15 DIAGNOSIS — Z8673 Personal history of transient ischemic attack (TIA), and cerebral infarction without residual deficits: Secondary | ICD-10-CM | POA: Diagnosis not present

## 2016-08-15 DIAGNOSIS — Z7902 Long term (current) use of antithrombotics/antiplatelets: Secondary | ICD-10-CM | POA: Diagnosis not present

## 2016-08-15 DIAGNOSIS — Z905 Acquired absence of kidney: Secondary | ICD-10-CM | POA: Insufficient documentation

## 2016-08-15 DIAGNOSIS — Z87891 Personal history of nicotine dependence: Secondary | ICD-10-CM | POA: Insufficient documentation

## 2016-08-15 DIAGNOSIS — I160 Hypertensive urgency: Secondary | ICD-10-CM | POA: Diagnosis not present

## 2016-08-15 DIAGNOSIS — F419 Anxiety disorder, unspecified: Secondary | ICD-10-CM | POA: Insufficient documentation

## 2016-08-15 DIAGNOSIS — Z8249 Family history of ischemic heart disease and other diseases of the circulatory system: Secondary | ICD-10-CM | POA: Diagnosis not present

## 2016-08-15 DIAGNOSIS — I6523 Occlusion and stenosis of bilateral carotid arteries: Secondary | ICD-10-CM | POA: Insufficient documentation

## 2016-08-15 DIAGNOSIS — I2584 Coronary atherosclerosis due to calcified coronary lesion: Secondary | ICD-10-CM | POA: Insufficient documentation

## 2016-08-15 HISTORY — DX: Low back pain, unspecified: M54.50

## 2016-08-15 HISTORY — DX: Other chronic pain: G89.29

## 2016-08-15 HISTORY — DX: Unspecified osteoarthritis, unspecified site: M19.90

## 2016-08-15 HISTORY — DX: Low back pain: M54.5

## 2016-08-15 LAB — BASIC METABOLIC PANEL
Anion gap: 10 (ref 5–15)
BUN: 17 mg/dL (ref 6–20)
CO2: 18 mmol/L — ABNORMAL LOW (ref 22–32)
CREATININE: 1.09 mg/dL (ref 0.61–1.24)
Calcium: 9.4 mg/dL (ref 8.9–10.3)
Chloride: 108 mmol/L (ref 101–111)
Glucose, Bld: 88 mg/dL (ref 65–99)
Potassium: 4.2 mmol/L (ref 3.5–5.1)
SODIUM: 136 mmol/L (ref 135–145)

## 2016-08-15 LAB — URINALYSIS, ROUTINE W REFLEX MICROSCOPIC
Bilirubin Urine: NEGATIVE
GLUCOSE, UA: NEGATIVE mg/dL
HGB URINE DIPSTICK: NEGATIVE
KETONES UR: NEGATIVE mg/dL
Leukocytes, UA: NEGATIVE
Nitrite: NEGATIVE
PH: 5 (ref 5.0–8.0)
Protein, ur: NEGATIVE mg/dL
SPECIFIC GRAVITY, URINE: 1.008 (ref 1.005–1.030)

## 2016-08-15 LAB — I-STAT TROPONIN, ED
TROPONIN I, POC: 0 ng/mL (ref 0.00–0.08)
Troponin i, poc: 0 ng/mL (ref 0.00–0.08)

## 2016-08-15 LAB — CBC
HCT: 41.7 % (ref 39.0–52.0)
Hemoglobin: 14.1 g/dL (ref 13.0–17.0)
MCH: 30.2 pg (ref 26.0–34.0)
MCHC: 33.8 g/dL (ref 30.0–36.0)
MCV: 89.3 fL (ref 78.0–100.0)
PLATELETS: 242 10*3/uL (ref 150–400)
RBC: 4.67 MIL/uL (ref 4.22–5.81)
RDW: 14 % (ref 11.5–15.5)
WBC: 7 10*3/uL (ref 4.0–10.5)

## 2016-08-15 LAB — TROPONIN I: Troponin I: <0.03 ng/mL (ref <0.03)

## 2016-08-15 LAB — BRAIN NATRIURETIC PEPTIDE: B Natriuretic Peptide: 29.4 pg/mL (ref 0.0–100.0)

## 2016-08-15 MED ORDER — METOPROLOL TARTRATE 50 MG PO TABS
50.0000 mg | ORAL_TABLET | Freq: Two times a day (BID) | ORAL | Status: DC
  Administered 2016-08-15 – 2016-08-17 (×4): 50 mg via ORAL
  Filled 2016-08-15 (×4): qty 1

## 2016-08-15 MED ORDER — ATORVASTATIN CALCIUM 80 MG PO TABS
80.0000 mg | ORAL_TABLET | Freq: Every evening | ORAL | Status: DC
  Administered 2016-08-15 – 2016-08-16 (×2): 80 mg via ORAL
  Filled 2016-08-15 (×2): qty 1

## 2016-08-15 MED ORDER — ONDANSETRON HCL 4 MG/2ML IJ SOLN: 4.0000 mg | Freq: Four times a day (QID) | INTRAMUSCULAR | Status: DC | PRN

## 2016-08-15 MED ORDER — ASPIRIN 81 MG PO CHEW
81.0000 mg | CHEWABLE_TABLET | ORAL | Status: AC
  Administered 2016-08-16: 81 mg via ORAL
  Filled 2016-08-15: qty 1

## 2016-08-15 MED ORDER — ASPIRIN EC 81 MG PO TBEC
81.0000 mg | DELAYED_RELEASE_TABLET | Freq: Every day | ORAL | Status: DC
  Administered 2016-08-17: 81 mg via ORAL
  Filled 2016-08-15: qty 1

## 2016-08-15 MED ORDER — SODIUM CHLORIDE 0.9% FLUSH
3.0000 mL | Freq: Two times a day (BID) | INTRAVENOUS | Status: DC
  Administered 2016-08-15 – 2016-08-16 (×2): 3 mL via INTRAVENOUS

## 2016-08-15 MED ORDER — SODIUM CHLORIDE 0.9 % WEIGHT BASED INFUSION
3.0000 mL/kg/h | INTRAVENOUS | Status: AC
  Administered 2016-08-16: 3 mL/kg/h via INTRAVENOUS

## 2016-08-15 MED ORDER — HEPARIN BOLUS VIA INFUSION
4000.0000 [IU] | Freq: Once | INTRAVENOUS | Status: AC
  Administered 2016-08-15: 4000 [IU] via INTRAVENOUS
  Filled 2016-08-15: qty 4000

## 2016-08-15 MED ORDER — SODIUM CHLORIDE 0.9% FLUSH: 3.0000 mL | INTRAVENOUS | Status: DC | PRN

## 2016-08-15 MED ORDER — PANTOPRAZOLE SODIUM 40 MG PO TBEC
40.0000 mg | DELAYED_RELEASE_TABLET | Freq: Every day | ORAL | Status: DC
  Administered 2016-08-15 – 2016-08-17 (×3): 40 mg via ORAL
  Filled 2016-08-15 (×3): qty 1

## 2016-08-15 MED ORDER — HEPARIN (PORCINE) IN NACL 100-0.45 UNIT/ML-% IJ SOLN
1500.0000 [IU]/h | INTRAMUSCULAR | Status: DC
  Administered 2016-08-15: 1400 [IU]/h via INTRAVENOUS
  Administered 2016-08-16: 1500 [IU]/h via INTRAVENOUS
  Filled 2016-08-15 (×2): qty 250

## 2016-08-15 MED ORDER — ACETAMINOPHEN 325 MG PO TABS
650.0000 mg | ORAL_TABLET | ORAL | Status: DC | PRN
  Administered 2016-08-15 – 2016-08-16 (×3): 650 mg via ORAL
  Filled 2016-08-15 (×3): qty 2

## 2016-08-15 MED ORDER — ONDANSETRON HCL 4 MG/2ML IJ SOLN
4.0000 mg | Freq: Once | INTRAMUSCULAR | Status: AC
  Administered 2016-08-15: 4 mg via INTRAVENOUS
  Filled 2016-08-15: qty 2

## 2016-08-15 MED ORDER — GI COCKTAIL ~~LOC~~: 30.0000 mL | Freq: Once | ORAL | Status: DC

## 2016-08-15 MED ORDER — CLOPIDOGREL BISULFATE 75 MG PO TABS
75.0000 mg | ORAL_TABLET | Freq: Every day | ORAL | Status: DC
  Administered 2016-08-15 – 2016-08-17 (×3): 75 mg via ORAL
  Filled 2016-08-15 (×3): qty 1

## 2016-08-15 MED ORDER — IRBESARTAN 300 MG PO TABS
300.0000 mg | ORAL_TABLET | Freq: Every day | ORAL | Status: DC
  Administered 2016-08-15 – 2016-08-17 (×3): 300 mg via ORAL
  Filled 2016-08-15 (×3): qty 1

## 2016-08-15 MED ORDER — ASPIRIN EC 81 MG PO TBEC: 81.0000 mg | DELAYED_RELEASE_TABLET | Freq: Every day | ORAL | Status: DC

## 2016-08-15 MED ORDER — LORAZEPAM 0.5 MG PO TABS: 0.5000 mg | ORAL_TABLET | Freq: Two times a day (BID) | ORAL | Status: DC | PRN

## 2016-08-15 MED ORDER — NITROGLYCERIN 0.4 MG SL SUBL
0.4000 mg | SUBLINGUAL_TABLET | SUBLINGUAL | Status: DC | PRN
  Administered 2016-08-15 (×2): 0.4 mg via SUBLINGUAL
  Filled 2016-08-15: qty 1

## 2016-08-15 MED ORDER — FAMOTIDINE 20 MG PO TABS
20.0000 mg | ORAL_TABLET | Freq: Once | ORAL | Status: AC
  Administered 2016-08-15: 20 mg via ORAL
  Filled 2016-08-15: qty 1

## 2016-08-15 MED ORDER — SODIUM CHLORIDE 0.9 % IV SOLN: 250.0000 mL | INTRAVENOUS | Status: DC | PRN

## 2016-08-15 MED ORDER — ASPIRIN 81 MG PO CHEW
324.0000 mg | CHEWABLE_TABLET | Freq: Once | ORAL | Status: AC
  Administered 2016-08-15: 324 mg via ORAL
  Filled 2016-08-15: qty 4

## 2016-08-15 MED ORDER — NITROGLYCERIN 2 % TD OINT
1.0000 [in_us] | TOPICAL_OINTMENT | Freq: Four times a day (QID) | TRANSDERMAL | Status: DC
  Administered 2016-08-15 – 2016-08-16 (×3): 1 [in_us] via TOPICAL
  Filled 2016-08-15: qty 30

## 2016-08-15 MED ORDER — SODIUM CHLORIDE 0.9 % WEIGHT BASED INFUSION
1.0000 mL/kg/h | INTRAVENOUS | Status: DC
  Administered 2016-08-16: 1 mL/kg/h via INTRAVENOUS

## 2016-08-15 NOTE — ED Triage Notes (Addendum)
Pt arrives via EMS from urology office for recheck of one kidney due to CA hx. Pt developed left sided chest pain while sitting. Pt with hx of 2 strokes on plavix. EKG for EMS SR and pt hypertensive in 200s. No ASA. Pt with 2 SL nitro and 2mg  morphine with some relief of pain. Pt currently c/o 6/10 tightness. Pt a&ox4, VSS.

## 2016-08-15 NOTE — ED Provider Notes (Signed)
[facility] DEPT Provider Note   CSN: [id number] Arrival date & time: 08/15/16  1016     History   Chief Complaint Chief Complaint  Patient presents with  . Chest Pain    HPI Jake Dunn is a 76 y.o. male with a past medical history of coronary artery disease, status post stent placement, hypertension, hyperlipidemia, previous renal cell carcinoma who presents emergency Department with chief complaint of chest pressure. The patient was at his urologist office when he had sudden onset of epigastric pressure radiating substernally with associated shortness of breath. The patient also reports recent episodes of chest pressure. Patient states that he was sitting in his recliner when he had sudden onset chest pressure and pain that lasted almost 30 minutes with associated left hand numbness and diaphoresis. He also states that he was trying to blow leaves in his yard and became so short of breath and weak that he was unable continue exerting himself. His symptoms have been relieved with rest previously. He continues to feel significant pressure at this time.  HPI  Past Medical History:  Diagnosis Date  . Adenomatous polyp   . Anxiety   . CAD (coronary artery disease)    s/p cath in 2014 showing significant stenosis in the diagonal side branches and in the RV marginal branch. These vessels were small and diffusely diseased. He is managed medically.   . Disc disease, degenerative, cervical   . GERD (gastroesophageal reflux disease)   . Gout   . History of back surgery   . Hyperlipidemia   . Hypertension   . Osteoarthritis   . Renal cell carcinoma    left kidney removal  . Stroke ([place])    MINI STROKE 15+ YRS AGO, no residual  . Syncope and collapse     Patient Active Problem List   Diagnosis Date Noted  . Localized primary osteoarthritis of right shoulder region 04/06/2016  . Coughing/wheezing 08/17/2015  . Angioedema 08/17/2015  . Hypertension   . CAD (coronary artery  disease)   . Stroke ([place])   . Hyperlipidemia   . Dyspnea 08/07/2012  . Pre-operative clearance 06/01/2011    Past Surgical History:  Procedure Laterality Date  . BACK SURGERY    . CARDIAC CATHETERIZATION  12/14/2008   ef 50-55%. SHOWED SIGNIFICANT STENOSIS AND TO DIAGONAL SIDE BRANCHES AND IN THE RIGHT VENTRICULAR MARGINAL BRANCH. THESE VESSELS WERE SMALL AND DIFFUSELY DISEASED  . CARDIOVASCULAR STRESS TEST  12/10/2009   EF 61%. EKG negative. Mild peri ischemia. Managed medically.   . CARPAL TUNNEL RELEASE    . CERVICAL SPINE SURGERY     5 YRS AGO  . JOINT REPLACEMENT     RIGHT SHOULDER  . LEFT HEART CATHETERIZATION WITH CORONARY ANGIOGRAM N/A 08/09/2012   Procedure: LEFT HEART CATHETERIZATION WITH CORONARY ANGIOGRAM;  Surgeon: Peter M Martinique, MD;  Location[facility] C[facility] LAB;  Service: Cardiovascular;  Laterality: N/A;  . LUMBAR FUSION    . NEPHRECTOMY    . POLYPECTOMY    . REPLACEMENT TOTAL KNEE    . TOTAL KNEE ARTHROPLASTY  06/08/2011   Procedure: TOTAL KNEE ARTHROPLASTY;  Surgeon: [name], MD;  Location: [facility];  Service: Orthopedics;  Laterality: Left;  . TOTAL SHOULDER ARTHROPLASTY Right 04/06/2016   Procedure: RIGHT REVERSE TOTAL SHOULDER ARTHROPLASTY;  Surgeon: [name], MD;  Location: [facility];  Service: Orthopedics;  Laterality: Right;  . TOTAL SHOULDER REPLACEMENT Right   . US ECHOCARDIOGRAPHY  12/15/2008   EF 60-65%  .  US ECHOCARDIOGRAPHY  09/28/2004   EF 55-60%       Home Medications    Prior to Admission medications   Medication Sig Start Date End Date Taking? Authorizing Provider  acyclovir (ZOVIRAX) 400 MG tablet Take 400 mg by mouth 2 (two) times daily. Reported on 08/17/2015 08/08/14   Historical Provider, MD  allopurinol (ZYLOPRIM) 300 MG tablet Take 300 mg by mouth daily.    Historical Provider, MD  atorvastatin (LIPITOR) 80 MG tablet Take 80 mg by mouth every evening.  08/12/15   Historical Provider, MD  clopidogrel (PLAVIX) 75 MG tablet Take 75 mg by mouth  daily.    Historical Provider, MD  EPINEPHrine 0.3 mg/0.3 mL IJ SOAJ injection USE AS DIRECTED IF LIFE THREATENING REACTION OCCURS 08/07/15   Historical Provider, MD  hydrochlorothiazide (MICROZIDE) 12.5 MG capsule Take 1 capsule (12.5 mg total) by mouth daily. 09/12/14   Peter M Martinique, MD  LORazepam (ATIVAN) 0.5 MG tablet Take 0.5 mg by mouth 2 (two) times daily as needed for anxiety.  08/07/15   Historical Provider, MD  metoprolol (LOPRESSOR) 50 MG tablet Take 50 mg by mouth 2 (two) times daily.    Historical Provider, MD  ondansetron (ZOFRAN) 4 MG tablet Take 1 tablet (4 mg total) by mouth every 8 (eight) hours as needed for nausea or vomiting. 04/06/16   Aundra Dubin, PA-C  oxyCODONE-acetaminophen (PERCOCET) 5-325 MG tablet Take 1-2 tablets by mouth every 4 (four) hours as needed for severe pain. 04/06/16   Aundra Dubin, PA-C  pantoprazole (PROTONIX) 40 MG tablet Take 40 mg by mouth daily.    Historical Provider, MD  valsartan (DIOVAN) 320 MG tablet Take 320 mg by mouth daily. 06/01/15   Historical Provider, MD    Family History Family History  Problem Relation Age of Onset  . Heart attack Father   . Heart disease Brother   . Allergic rhinitis Neg Hx   . Angioedema Neg Hx   . Asthma Neg Hx   . Atopy Neg Hx   . Eczema Neg Hx   . Immunodeficiency Neg Hx   . Urticaria Neg Hx     Social History Social History  Substance Use Topics  . Smoking status: Former Smoker    Quit date: 05/31/1989  . Smokeless tobacco: Never Used  . Alcohol use 0.0 oz/week     Comment: 6-8 beers per day, has not had any since end of October 2017     Allergies   Adhesive [tape] and Latex   Review of Systems Review of Systems Ten systems reviewed and are negative for acute change, except as noted in the HPI.    Physical Exam Updated Vital Signs BP (!) 150/84   Pulse 69   Temp 98 F (36.7 C) (Oral)   Resp 12   SpO2 100%   Physical Exam  Constitutional: He appears well-developed and  well-nourished. No distress.  Appears uncomfortable  HENT:  Head: Normocephalic and atraumatic.  Eyes: Conjunctivae are normal. No scleral icterus.  Neck: Normal range of motion. Neck supple.  Cardiovascular: Normal rate, regular rhythm and normal heart sounds.   Pulmonary/Chest: Effort normal and breath sounds normal. No respiratory distress.  Abdominal: Soft. There is no tenderness.  Musculoskeletal: Normal range of motion. He exhibits no edema.  Neurological: He is alert.  Skin: Skin is warm and dry. He is not diaphoretic.  Psychiatric: His behavior is normal.  Nursing note and vitals reviewed.    ED Treatments / Results  Labs (all labs ordered are listed, but only abnormal results are displayed) Labs Reviewed  CBC  BASIC METABOLIC PANEL  BRAIN NATRIURETIC PEPTIDE  URINALYSIS, ROUTINE W REFLEX MICROSCOPIC  I-STAT [place], ED    EKG  EKG Interpretation  Date/Time:  Monday August 15 2016 10:17:49 EDT Ventricular Rate:  72 PR Interval:    QRS Duration: 98 QT Interval:  384 QTC Calculation: 421 R Axis:   51 Text Interpretation:  Sinus rhythm `no acute st/t changes Confirmed by Ashok Cordia  MD, Lennette Bihari ([id number]) on 08/15/2016 10:22:47 AM       Radiology Dg Chest 2 View  Result Date: 08/15/2016 CLINICAL DATA:  Previous renal carcinoma on left. Left-sided chest pain EXAM: CHEST  2 VIEW COMPARISON:  August 02, 2015 FINDINGS: There is a questionable nipple shadow on the left. There is mild scarring in the left base. Lungs elsewhere clear. Heart size and pulmonary vascularity are normal. No adenopathy. There is degenerative change in the thoracic spine. No blastic or lytic bone lesions. There is a total shoulder replacement right. There is postoperative change in the lower cervical region. There is calcification in each carotid artery as well as aortic atherosclerosis. IMPRESSION: Question nipple shadow on the left; advise repeat study with nipple markers to confirm. Mild scarring left  base. No edema or consolidation. No adenopathy. There is aortic atherosclerosis as well as bilateral carotid artery calcification. Electronically Signed   By: Lowella Grip III M.D.   On: 08/15/2016 10:50    Procedures Procedures (including critical care time)  Medications Ordered in ED Medications  nitroGLYCERIN (NITROSTAT) SL tablet 0.4 mg (not administered)  famotidine (PEPCID) tablet 20 mg (not administered)  aspirin chewable tablet 324 mg (324 mg Oral Given 08/15/16 1101)     Initial Impression / Assessment and Plan / ED Course  I have reviewed the triage vital signs and the nursing notes.  Pertinent labs & imaging results that were available during my care of the patient were reviewed by me and considered in my medical decision making (see chart for details).  Clinical Course as of Aug 16 1530  Mon Aug 15, 2016  1355 No ekg changes. Trop negative. I have concern for unstable Angina. Patient will be seen by Cardiology  [AH]    Clinical Course User Index [AH] Margarita Mail, PA-C    Patient will be admitted to the hospital by cardiology. High concern for ACS . Patient stable throughout visit.  Final Clinical Impressions(s) / ED Diagnoses   Final diagnoses:  Precordial chest pain    New Prescriptions New Prescriptions   No medications on file     Margarita Mail, PA-C 08/17/16 [place], MD 08/20/16 1524

## 2016-08-15 NOTE — Progress Notes (Signed)
ANTICOAGULATION CONSULT NOTE - Initial Consult  Pharmacy Consult for Heparin Indication: chest pain/ACS  Allergies  Allergen Reactions  . Adhesive [Tape] Rash    Paper tape okay  . Latex Rash    Patient Measurements: Height: 5'7'' Weight: 222 lb 0.1 oz (100.7 kg) IBW: 93.7kg   Vital Signs: Temp: 98 F (36.7 C) (04/09 1022) Temp Source: Oral (04/09 1022) BP: 131/97 (04/09 1148) Pulse Rate: 95 (04/09 1148)  Labs:  Recent Labs  08/15/16 1102  HGB 14.1  HCT 41.7  PLT 242  CREATININE 1.09    Estimated Creatinine Clearance: 68.2 mL/min (by C-G formula based on SCr of 1.09 mg/dL).   Medical History: Past Medical History:  Diagnosis Date  . Adenomatous polyp   . Anxiety   . CAD (coronary artery disease)    s/p cath in 2014 showing significant stenosis in the diagonal side branches and in the RV marginal branch. These vessels were small and diffusely diseased. He is managed medically.   . Disc disease, degenerative, cervical   . GERD (gastroesophageal reflux disease)   . Gout   . History of back surgery   . Hyperlipidemia   . Hypertension   . Osteoarthritis   . Renal cell carcinoma    left kidney removal  . Stroke ([place])    MINI STROKE 15+ YRS AGO, no residual  . Syncope and collapse     Medications:  No anticoagulants pta  Assessment: 75yom to begin heparin for unstable angina with plan for cath tomorrow. Baseline labs wnl.  Goal of Therapy:  Heparin level 0.3-0.7 units/ml Monitor platelets by anticoagulation protocol: Yes   Plan:  1) Heparin bolus 4000 units x 1 2) Heparin drip 1400 units/hr 3) 8 hour heparin level 4) Daily heparin level and CBC  Deboraha Sprang 08/15/2016,2:57 PM

## 2016-08-15 NOTE — H&P (Signed)
History & Physical    Patient ID: Jake Dunn MRN: [id number], DOB/AGE: June 05, 1940   Admit date: 08/15/2016   Primary Physician: Vidal Schwalbe, MD Primary Cardiologist: [place]   Patient Profile    76 yo male with PMH of Nonobstructive CAD (70% 76rd diag '14), GERD, HTN, HL, renal cell CA (left kidney removed) who presented with chest pain.   Past Medical History   Past Medical History:  Diagnosis Date  . Adenomatous polyp   . Anxiety   . CAD (coronary artery disease)    s/p cath in 2014 showing significant stenosis in the diagonal side branches and in the RV marginal branch. These vessels were small and diffusely diseased. He is managed medically.   . Disc disease, degenerative, cervical   . GERD (gastroesophageal reflux disease)   . Gout   . History of back surgery   . Hyperlipidemia   . Hypertension   . Osteoarthritis   . Renal cell carcinoma    left kidney removal  . Stroke ([place])    MINI STROKE 15+ YRS AGO, no residual  . Syncope and collapse     Past Surgical History:  Procedure Laterality Date  . BACK SURGERY    . CARDIAC CATHETERIZATION  12/14/2008   ef 50-55%. SHOWED SIGNIFICANT STENOSIS AND TO DIAGONAL SIDE BRANCHES AND IN THE RIGHT VENTRICULAR MARGINAL BRANCH. THESE VESSELS WERE SMALL AND DIFFUSELY DISEASED  . CARDIOVASCULAR STRESS TEST  12/10/2009   EF 61%. EKG negative. Mild peri ischemia. Managed medically.   . CARPAL TUNNEL RELEASE    . CERVICAL SPINE SURGERY     5 YRS AGO  . JOINT REPLACEMENT     RIGHT SHOULDER  . LEFT HEART CATHETERIZATION WITH CORONARY ANGIOGRAM N/A 08/09/2012   Procedure: LEFT HEART CATHETERIZATION WITH CORONARY ANGIOGRAM;  Surgeon: Peter M Martinique, MD;  Location: [facility] C[facility];  Service: Cardiovascular;  Laterality: N/A;  . LUMBAR FUSION    . NEPHRECTOMY    . POLYPECTOMY    . REPLACEMENT TOTAL KNEE    . TOTAL KNEE ARTHROPLASTY  06/08/2011   Procedure: TOTAL KNEE ARTHROPLASTY;  Surgeon: [name], MD;  Location: [place];  Service:  Orthopedics;  Laterality: Left;  . TOTAL SHOULDER ARTHROPLASTY Right 04/06/2016   Procedure: RIGHT REVERSE TOTAL SHOULDER ARTHROPLASTY;  Surgeon: [name], MD;  Location: [place];  Service: Orthopedics;  Laterality: Right;  . TOTAL SHOULDER REPLACEMENT Right   . US ECHOCARDIOGRAPHY  12/15/2008   EF 60-65%  . US ECHOCARDIOGRAPHY  09/28/2004   EF 55-60%     Allergies  Allergies  Allergen Reactions  . Adhesive [Tape] Rash    Paper tape okay  . Latex Rash    History of Present Illness    Jake Dunn is a 76 yo male with PMH of  Nonobstructive CAD (70% 3rd diag '14), GERD, HTN, HL, renal cell CA. Last cath in 2014 showed nonobstructive CAD with 70% lesion at the 3rd diag that was treated medically. He has followed up on a regular basis since that time. Had right shoulder surgery about 5 months ago and has been doing PT since then. Very active, even though he has retired. 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 Recently started back to work. Was blowing leaves last Monday and began to feel weak and somewhat dyspneic. Was then sitting at home on Thursday night and had an episode of left sided chest pain that lasted a couple  of minutes. Today he was at the Urology office, walked into check in, then sat down and developed left sided chest pressure "like something was sitting on his chest". Blood pressure was in the 200s. He was given SL nitro and morphine with improvement but not resolution of pain.   In the ED labs showed stable electrolytes, BNP 29, Trop negx1, Hgb 14.1., CXR negative. EKG showed SR with minimal J point elevation in inferior leads, noted on previous EKGs. Still reported a 5/10 chest pain on exam. Does have pain with movement.   Home Medications    Prior to Admission medications   Medication Sig Start Date End Date Taking? Authorizing Provider  acyclovir (ZOVIRAX) 400 MG tablet Take 400 mg by mouth 2 (two) times daily.  Reported on 08/17/2015 08/08/14   Historical Provider, MD  allopurinol (ZYLOPRIM) 300 MG tablet Take 300 mg by mouth daily.    Historical Provider, MD  atorvastatin (LIPITOR) 80 MG tablet Take 80 mg by mouth every evening.  08/12/15   Historical Provider, MD  clopidogrel (PLAVIX) 75 MG tablet Take 75 mg by mouth daily.    Historical Provider, MD  EPINEPHrine 0.3 mg/0.3 mL IJ SOAJ injection USE AS DIRECTED IF LIFE THREATENING REACTION OCCURS 08/07/15   Historical Provider, MD  hydrochlorothiazide (MICROZIDE) 12.5 MG capsule Take 1 capsule (12.5 mg total) by mouth daily. 09/12/14   Peter M Martinique, MD  LORazepam (ATIVAN) 0.5 MG tablet Take 0.5 mg by mouth 2 (two) times daily as needed for anxiety.  08/07/15   Historical Provider, MD  metoprolol (LOPRESSOR) 50 MG tablet Take 50 mg by mouth 2 (two) times daily.    Historical Provider, MD  ondansetron (ZOFRAN) 4 MG tablet Take 1 tablet (4 mg total) by mouth every 8 (eight) hours as needed for nausea or vomiting. 04/06/16   Aundra Dubin, PA-C  oxyCODONE-acetaminophen (PERCOCET) 5-325 MG tablet Take 1-2 tablets by mouth every 4 (four) hours as needed for severe pain. 04/06/16   Aundra Dubin, PA-C  pantoprazole (PROTONIX) 40 MG tablet Take 40 mg by mouth daily.    Historical Provider, MD  valsartan (DIOVAN) 320 MG tablet Take 320 mg by mouth daily. 06/01/15   Historical Provider, MD    Family History     Family History  Problem Relation Age of Onset  . Heart attack Father   . Heart disease Brother   . Allergic rhinitis Neg Hx   . Angioedema Neg Hx   . Asthma Neg Hx   . Atopy Neg Hx   . Eczema Neg Hx   . Immunodeficiency Neg Hx   . Urticaria Neg Hx     Social History    Social History   Social History  . Marital status: Married    Spouse name: N/A  . Number of children: N/A  . Years of education: N/A   Occupational History  . Not on file.   Social History Main Topics  . Smoking status: Former Smoker    Quit date: 05/31/1989  .  Smokeless tobacco: Never Used  . Alcohol use 0.0 oz/week     Comment: 6-8 beers per day, has not had any since end of October 2017  . Drug use: No  . Sexual activity: Not Currently   Other Topics Concern  . Not on file   Social History Narrative  . No narrative on file     Review of Systems    General:  No chills, fever, night sweats or weight  changes.  Cardiovascular: See HPI Dermatological: No rash, lesions/masses Respiratory: No cough, dyspnea Urologic: No hematuria, dysuria Abdominal:   No nausea, vomiting, diarrhea, bright red blood per rectum, melena, or hematemesis Neurologic:  No visual changes, wkns, changes in mental status. All other systems reviewed and are otherwise negative except as noted above.  Physical Exam    Blood pressure (!) 131/97, pulse 95, temperature 98 F (36.7 C), temperature source Oral, resp. rate 20, SpO2 96 %.  General: Pleasant, NAD Psych: Normal affect. Neuro: Alert and oriented X 3. Moves all extremities spontaneously. HEENT: Normal  Neck: Supple without bruits or JVD. Lungs:  Resp regular and unlabored, CTA. Heart: RRR no s3, s4, or murmurs. Abdomen: Soft, non-tender, non-distended, BS + x 4.  Extremities: No clubbing, cyanosis or edema. DP/PT/Radials 2+ and equal bilaterally.   Labs    Troponin [facility] of Care Test)  Recent Labs  08/15/16 1114  TROPIPOC 0.00   No results for input(s): CKTOTAL, CKMB, TROPONINI in the last 72 hours. Lab Results  Component Value Date   WBC 7.0 08/15/2016   HGB 14.1 08/15/2016   HCT 41.7 08/15/2016   MCV 89.3 08/15/2016   PLT 242 08/15/2016    Recent Labs Lab 08/15/16 1102  NA 136  K 4.2  CL 108  CO2 18*  BUN 17  CREATININE 1.09  CALCIUM 9.4  GLUCOSE 88   Lab Results  Component Value Date   CHOL  12/15/2008    159        ATP III CLASSIFICATION:  <200     mg/dL   Desirable  200-239  mg/dL   Borderline High  >=240    mg/dL   High          HDL 32 (L) 12/15/2008   LDLCALC   12/15/2008    79        Total Cholesterol/HDL:CHD Risk Coronary Heart Disease Risk Table                     Men   Women  1/2 Average Risk   3.4   3.3  Average Risk       5.0   4.4  2 X Average Risk   9.6   7.1  3 X Average Risk  23.4   11.0        Use the calculated Patient Ratio above and the CHD Risk Table to determine the patient's CHD Risk.        ATP III CLASSIFICATION (LDL):  <100     mg/dL   Optimal  100-129  mg/dL   Near or Above                    Optimal  130-159  mg/dL   Borderline  160-189  mg/dL   High  >190     mg/dL   Very High   TRIG 240 (H) 12/15/2008   Lab Results  Component Value Date   DDIMER 0.58 (H) 08/02/2015     Radiology Studies    Dg Chest 2 View  Result Date: 08/15/2016 CLINICAL DATA:  Previous renal carcinoma on left. Left-sided chest pain EXAM: CHEST  2 VIEW COMPARISON:  August 02, 2015 FINDINGS: There is a questionable nipple shadow on the left. There is mild scarring in the left base. Lungs elsewhere clear. Heart size and pulmonary vascularity are normal. No adenopathy. There is degenerative change in the thoracic spine. No blastic or lytic bone lesions. There is  a total shoulder replacement right. There is postoperative change in the lower cervical region. There is calcification in each carotid artery as well as aortic atherosclerosis. IMPRESSION: Question nipple shadow on the left; advise repeat study with nipple markers to confirm. Mild scarring left base. No edema or consolidation. No adenopathy. There is aortic atherosclerosis as well as bilateral carotid artery calcification. Electronically Signed   By: Lowella Grip III M.D.   On: 08/15/2016 10:50    ECG & Cardiac Imaging    EKG: SR with minimal   Echo: 4/14  Study Conclusions  - Left ventricle: TECHNICALLY DIFFICULT STUDY. The cavity size was at the upper limits of normal. Wall thickness was increased in a pattern of mild LVH. The estimated ejection fraction was 60%. Wall  motion was normal; there were no regional wall motion abnormalities. Findings consistent with left ventricular diastolic dysfunction. - Left atrium: The atrium was mildly dilated. - Right ventricle: The cavity size was mildly dilated. Systolic function was normal.  Cath: 4/14  Coronary angiography: Coronary dominance: right  Left mainstem: Moderate calcification. 20% stenosis.  Left anterior descending (LAD): Very large vessel. 30-40% stenosis in the mid vessel. It gives rise to 3 diagonal branches. The first diagonal is a large branch with 30% disease in the proximal vessel. The second diagonal is very large and normal. The third diagonal is moderate in size with diffuse 70% disease in the proximal to mid vessel.  Left circumflex (LCx): Small, normal.  Right coronary artery (RCA): Large dominant vessel. Diffuse 30% stenosis in the mid vessel. 30% stenosis at origin of PLOM. Second PLOM with 50% stenosis in the proximal vessel.  Left ventriculography: Left ventricular systolic function is normal, LVEF is estimated at 55-65%, there is no significant mitral regurgitation   Final Conclusions:   1. Single vessel obstructive CAD. There is moderate stenosis in the third diagonal. Otherwise nonobstructive disease.  2. Normal LV function.  3. Normal LV filling pressures.   Assessment & Plan    76 yo male with PMH of Nonobstructive CAD (70% 3rd diag '14), GERD, HTN, HL, renal cell CA (left kidney removed) who presented with chest pain.  1. Unstable Angina: Developed weakness after exertion on Monday of last week, then chest pain while at rest Thursday evening, and again this morning while in the waiting room at his urology office. Given SL nitro and morphine with improved but not resolved. Last cath in 2014 showed nonobstructive CAD but lesion in the 3rd diag. Symptoms are somewhat atypical as his does have pain with movement, but concerning for ACS as well. The patient understands  that risks included but are not limited to stroke (1 in 1000), death (1 in 78), kidney failure [usually temporary] (1 in 500), bleeding (1 in 200), allergic reaction [possibly serious] (1 in 200).  -- admit to telemetry -- IV heparin -- plan for cardiac cath tomorrow -- add nitro paste -- check echo  2. HTN: Was hypertensive in the urology office, but now improved. Will continue home medications and follow blood pressure -- hold HCTZ in anticipation for cath  3. HL: on statin  4. Renal Cell Ca: left nephrectomy, followed by urology    Signed, Reino Bellis, NP-C Pager [phone] 08/15/2016, 2:18 PM  Agree with note by Reino Bellis NP-C  Pt with non critical CAD by prior cath 2014. Now with accelerated angina. Chest pressure resolved but still has mild residual chest pain. Exam benign. EKG w/o acute changes. Labs OK. Enz neg. Plan  admit, cycle enzymes. IV hep and topical nitrates. Cath tomorrow. The patient understands that risks included but are not limited to stroke (1 in 1000), death (1 in 42), kidney failure [usually temporary] (1 in 500), bleeding (1 in 200), allergic reaction [possibly serious] (1 in 200). The patient understands and agrees to proceed  Lorretta Harp, M.D., [place], [facility], [place], [place] [address] [place], [place]  [address]  [phone] 08/15/2016 2:51 PM

## 2016-08-16 ENCOUNTER — Inpatient Hospital Stay (HOSPITAL_COMMUNITY): Payer: PPO

## 2016-08-16 ENCOUNTER — Encounter (HOSPITAL_COMMUNITY)
Admission: EM | Disposition: A | Discharge status: Home / Self Care | Payer: Self-pay | Source: Home / Self Care | Attending: Emergency Medicine

## 2016-08-16 ENCOUNTER — Other Ambulatory Visit (HOSPITAL_COMMUNITY): Payer: PPO

## 2016-08-16 ENCOUNTER — Encounter (HOSPITAL_COMMUNITY): Payer: Self-pay | Admitting: Cardiovascular Disease

## 2016-08-16 DIAGNOSIS — I25110 Atherosclerotic heart disease of native coronary artery with unstable angina pectoris: Secondary | ICD-10-CM | POA: Diagnosis not present

## 2016-08-16 HISTORY — PX: LEFT HEART CATH AND CORONARY ANGIOGRAPHY: CATH118249

## 2016-08-16 LAB — LIPID PANEL
Cholesterol: 148 mg/dL (ref 0–200)
HDL: 36 mg/dL — ABNORMAL LOW (ref >40)
LDL Cholesterol: 84 mg/dL (ref 0–99)
Total CHOL/HDL Ratio: 4.1 RATIO
Triglycerides: 138 mg/dL (ref <150)
VLDL: 28 mg/dL (ref 0–40)

## 2016-08-16 LAB — HEPARIN LEVEL (UNFRACTIONATED)
Heparin Unfractionated: 0.33 IU/mL (ref 0.30–0.70)
Heparin Unfractionated: 0.35 IU/mL (ref 0.30–0.70)

## 2016-08-16 LAB — BASIC METABOLIC PANEL
Anion gap: 11 (ref 5–15)
BUN: 18 mg/dL (ref 6–20)
CALCIUM: 9.3 mg/dL (ref 8.9–10.3)
CHLORIDE: 106 mmol/L (ref 101–111)
CO2: 22 mmol/L (ref 22–32)
CREATININE: 1.23 mg/dL (ref 0.61–1.24)
GFR calc non Af Amer: 56 mL/min — ABNORMAL LOW (ref >60)
Glucose, Bld: 101 mg/dL — ABNORMAL HIGH (ref 65–99)
Potassium: 4 mmol/L (ref 3.5–5.1)
Sodium: 139 mmol/L (ref 135–145)

## 2016-08-16 LAB — CBC
HEMATOCRIT: 41 % (ref 39.0–52.0)
Hemoglobin: 13.5 g/dL (ref 13.0–17.0)
MCH: 29.7 pg (ref 26.0–34.0)
MCHC: 32.9 g/dL (ref 30.0–36.0)
MCV: 90.3 fL (ref 78.0–100.0)
Platelets: 232 10*3/uL (ref 150–400)
RBC: 4.54 MIL/uL (ref 4.22–5.81)
RDW: 14.3 % (ref 11.5–15.5)
WBC: 6.2 10*3/uL (ref 4.0–10.5)

## 2016-08-16 LAB — TROPONIN I

## 2016-08-16 LAB — PROTIME-INR
INR: 1.03
PROTHROMBIN TIME: 13.5 s (ref 11.4–15.2)

## 2016-08-16 SURGERY — LEFT HEART CATH AND CORONARY ANGIOGRAPHY
Anesthesia: LOCAL

## 2016-08-16 MED ORDER — HEPARIN (PORCINE) IN NACL 2-0.9 UNIT/ML-% IJ SOLN
INTRAMUSCULAR | Status: AC
  Filled 2016-08-16: qty 1000

## 2016-08-16 MED ORDER — SODIUM CHLORIDE 0.9% FLUSH: 3.0000 mL | INTRAVENOUS | Status: DC | PRN

## 2016-08-16 MED ORDER — SODIUM CHLORIDE 0.9 % IV SOLN
250.0000 mL | INTRAVENOUS | Status: DC | PRN
Start: 2016-08-16 — End: 2016-08-17

## 2016-08-16 MED ORDER — VERAPAMIL HCL 2.5 MG/ML IV SOLN
INTRAVENOUS | Status: AC
  Filled 2016-08-16: qty 2

## 2016-08-16 MED ORDER — HEPARIN (PORCINE) IN NACL 2-0.9 UNIT/ML-% IJ SOLN
INTRAMUSCULAR | Status: DC | PRN
  Administered 2016-08-16: 1000 mL

## 2016-08-16 MED ORDER — LIDOCAINE HCL (PF) 1 % IJ SOLN
INTRAMUSCULAR | Status: AC
  Filled 2016-08-16: qty 30

## 2016-08-16 MED ORDER — IOPAMIDOL (ISOVUE-370) INJECTION 76%
INTRAVENOUS | Status: AC
  Filled 2016-08-16: qty 100

## 2016-08-16 MED ORDER — IOPAMIDOL (ISOVUE-370) INJECTION 76%
INTRAVENOUS | Status: DC | PRN
  Administered 2016-08-16: 70 mL via INTRA_ARTERIAL

## 2016-08-16 MED ORDER — HEPARIN SODIUM (PORCINE) 1000 UNIT/ML IJ SOLN
INTRAMUSCULAR | Status: AC
  Filled 2016-08-16: qty 1

## 2016-08-16 MED ORDER — FENTANYL CITRATE (PF) 100 MCG/2ML IJ SOLN
INTRAMUSCULAR | Status: AC
  Filled 2016-08-16: qty 2

## 2016-08-16 MED ORDER — VERAPAMIL HCL 2.5 MG/ML IV SOLN
INTRAVENOUS | Status: DC | PRN
  Administered 2016-08-16: 10 mL via INTRA_ARTERIAL

## 2016-08-16 MED ORDER — FENTANYL CITRATE (PF) 100 MCG/2ML IJ SOLN
INTRAMUSCULAR | Status: DC | PRN
  Administered 2016-08-16: 25 ug via INTRAVENOUS
  Administered 2016-08-16: 50 ug via INTRAVENOUS
  Administered 2016-08-16: 25 ug via INTRAVENOUS

## 2016-08-16 MED ORDER — HEPARIN SODIUM (PORCINE) 1000 UNIT/ML IJ SOLN
INTRAMUSCULAR | Status: DC | PRN
  Administered 2016-08-16: 5000 [IU] via INTRAVENOUS

## 2016-08-16 MED ORDER — ZOLPIDEM TARTRATE 5 MG PO TABS
5.0000 mg | ORAL_TABLET | Freq: Every evening | ORAL | Status: DC | PRN
  Administered 2016-08-16: 5 mg via ORAL
  Filled 2016-08-16: qty 1

## 2016-08-16 MED ORDER — SODIUM CHLORIDE 0.9 % WEIGHT BASED INFUSION: 1.0000 mL/kg/h | INTRAVENOUS | Status: AC

## 2016-08-16 MED ORDER — MIDAZOLAM HCL 2 MG/2ML IJ SOLN
INTRAMUSCULAR | Status: DC | PRN
  Administered 2016-08-16: 1 mg via INTRAVENOUS
  Administered 2016-08-16: 2 mg via INTRAVENOUS

## 2016-08-16 MED ORDER — SODIUM CHLORIDE 0.9% FLUSH
3.0000 mL | Freq: Two times a day (BID) | INTRAVENOUS | Status: DC
  Administered 2016-08-16: 3 mL via INTRAVENOUS

## 2016-08-16 MED ORDER — LIDOCAINE HCL (PF) 1 % IJ SOLN
INTRAMUSCULAR | Status: DC | PRN
  Administered 2016-08-16: 2 mL via SUBCUTANEOUS

## 2016-08-16 MED ORDER — MIDAZOLAM HCL 2 MG/2ML IJ SOLN
INTRAMUSCULAR | Status: AC
  Filled 2016-08-16: qty 2

## 2016-08-16 MED ORDER — ISOSORBIDE MONONITRATE ER 30 MG PO TB24
30.0000 mg | ORAL_TABLET | Freq: Every day | ORAL | Status: DC
  Administered 2016-08-16 – 2016-08-17 (×2): 30 mg via ORAL
  Filled 2016-08-16 (×2): qty 1

## 2016-08-16 SURGICAL SUPPLY — 10 items
CATH 5FR JL3.5 JR4 ANG PIG MP (CATHETERS) ×2 IMPLANT
DEVICE RAD COMP TR BAND LRG (VASCULAR PRODUCTS) ×1 IMPLANT
GLIDESHEATH SLEND SS 6F .021 (SHEATH) ×2 IMPLANT
GUIDEWIRE INQWIRE 1.5J.035X260 (WIRE) IMPLANT
INQWIRE 1.5J .035X260CM (WIRE) ×2
KIT HEART LEFT (KITS) ×2 IMPLANT
PACK CARDIAC CATHETERIZATION (CUSTOM PROCEDURE TRAY) ×2 IMPLANT
SYR MEDRAD MARK V 150ML (SYRINGE) ×2 IMPLANT
TRANSDUCER W/STOPCOCK (MISCELLANEOUS) ×2 IMPLANT
TUBING CIL FLEX 10 FLL-RA (TUBING) ×2 IMPLANT

## 2016-08-16 NOTE — Progress Notes (Signed)
Advance Beneficiary Notice of Noncoverage (ABN) given to the patient as requested by Loma Sousa RN UR Reviewer / Dr Burnard Bunting Medical Director; Letter explained to the patient in detail, all questions answered. Patient refused to sign letter; Aneta Mins [phone]

## 2016-08-16 NOTE — Progress Notes (Signed)
Right Radial site level 0 , dressing clean dry and intact. Endorsed to incoming RN.

## 2016-08-16 NOTE — Interval H&P Note (Signed)
History and Physical Interval Note:  08/16/2016 10:58 AM  Jake Dunn  has presented today for surgery, with the diagnosis of unstable angina  The various methods of treatment have been discussed with the patient and family. After consideration of risks, benefits and other options for treatment, the patient has consented to  Procedure(s): Left Heart Cath and Coronary Angiography (N/A) as a surgical intervention .  The patient's history has been reviewed, patient examined, no change in status, stable for surgery.  I have reviewed the patient's chart and labs.  Questions were answered to the patient's satisfaction.     [name]

## 2016-08-16 NOTE — Progress Notes (Signed)
In a 12 seconds strips  patient has two sinus pause. Pt. asymptomatic. Denies any pain or discomfort. MD notified.

## 2016-08-16 NOTE — Progress Notes (Signed)
ANTICOAGULATION CONSULT NOTE - Follow Up Consult  Pharmacy Consult for heparin Indication: USAP  Labs:  Recent Labs  08/15/16 1102 08/15/16 1822 08/15/16 2340 08/16/16 0504  HGB 14.1  --   --  13.5  HCT 41.7  --   --  41.0  PLT 242  --   --  232  LABPROT  --   --   --  13.5  INR  --   --   --  1.03  HEPARINUNFRC  --   --  0.33 0.35  CREATININE 1.09  --   --  1.23  TROPONINI  --  <0.03 <0.03 <0.03    Assessment: 75yo male therapeutic on heparin Heparin level therapeutic this AM  Goal of Therapy:  Heparin level 0.3-0.7 units/ml   Plan:  Continue heparin at 1500 units / hr Follow up after cath  Thank you Anette Guarneri, PharmD [phone] 08/16/2016,8:43 AM

## 2016-08-16 NOTE — Progress Notes (Signed)
ANTICOAGULATION CONSULT NOTE - Follow Up Consult  Pharmacy Consult for heparin Indication: USAP  Labs:  Recent Labs  08/15/16 1102 08/15/16 1822 08/15/16 2340  HGB 14.1  --   --   HCT 41.7  --   --   PLT 242  --   --   HEPARINUNFRC  --   --  0.33  CREATININE 1.09  --   --   TROPONINI  --  <0.03  --     Assessment: 76yo male therapeutic on heparin with initial dosing for CP though at low end of goal.  Goal of Therapy:  Heparin level 0.3-0.7 units/ml   Plan:  Will increase heparin gtt slightly to 1500 units/hr and check level with am labs.  Wynona Neat, PharmD, BCPS  08/16/2016,12:48 AM

## 2016-08-16 NOTE — Progress Notes (Signed)
Taught the benefits of a low sodium diet in relation to hypertension.

## 2016-08-16 NOTE — Care Management Obs Status (Signed)
[place] NOTIFICATION   Patient Details  Name: Jake Dunn MRN: [id number] Date of Birth: 07-Jun-1940   Medicare Observation Status Notification Given:  Yes (Patient refused to sign)    Royston Bake, RN 08/16/2016, 3:35 PM

## 2016-08-16 NOTE — Progress Notes (Signed)
MD response immediately. No new orders noted.

## 2016-08-16 NOTE — Progress Notes (Signed)
Received patient post cath, alert and oriented. Right radial cath site with TR band 14cc air per report, level 0, no s/s of bleeding or hematoma noted. Will monitor accordingly.

## 2016-08-16 NOTE — Discharge Summary (Signed)
Discharge Summary    Patient ID: Jake Dunn,  MRN: [id number], DOB/AGE: 11/17/1940 76 y.o.  Admit date: 08/15/2016 Discharge date: 08/17/2016  Primary Care Provider: Vidal Dunn Primary Cardiologist: Jake Dunn   Discharge Diagnoses    Active Problems:   Unstable angina ([place])   Allergies Allergies  Allergen Reactions  . Adhesive [Tape] Rash    Paper tape okay  . Latex Rash    Diagnostic Studies/Procedures    LHC: 08/16/16  Conclusion   1. 3 vessel CAD with moderate calcified stenoses of the mid-LAD/second diagonal, ramus intermedius, ostial and proximal circumflex, and severe stenosis of the second PLA branch of the RCA 2. Normal LV systolic function and normal LVEDP  Recommend: Aggressive medical therapy. Outpatient exercise stress myoview. Consider TCTS evaluation for CABG if Myoview high-risk. Otherwise continue with medical therapy.  While the LAD/diag only show modest progression compared to 2010 and 2014 cath studies, the left circumflex/intermediate stenoses have progressed.    TTE: 08/17/16  Study Conclusions  - Left ventricle: The cavity size was normal. There was mild   concentric hypertrophy. Systolic function was normal. The   estimated ejection fraction was in the range of 55% to 60%. Wall   motion was normal; there were no regional wall motion   abnormalities. Features are consistent with a pseudonormal left   ventricular filling pattern, with concomitant abnormal relaxation   and increased filling pressure (grade 2 diastolic dysfunction).   Doppler parameters are consistent with indeterminate ventricular   filling pressure. - Aortic valve: Transvalvular velocity was within the normal range.   There was no stenosis. There was trivial regurgitation. - Mitral valve: Transvalvular velocity was within the normal range.   There was no evidence for stenosis. There was trivial   regurgitation. - Left atrium: The atrium was severely dilated. -  Right ventricle: The cavity size was normal. Wall thickness was   normal. Systolic function was normal. - Atrial septum: No defect or patent foramen ovale was identified. - Tricuspid valve: There was trivial regurgitation. - Pulmonary arteries: Systolic pressure was within the normal   range. PA peak pressure: 30 mm Hg (S). _____________   History of Present Illness     Jake Dunn is a 76 yo male with PMH of  Nonobstructive CAD (70% 3rd diag '14), GERD, HTN, HL, renal cell CA. Last cath in 2014 showed nonobstructive CAD with 70% lesion at the 3rd diag that was treated medically. He has followed up on a regular basis since that time. Had right shoulder surgery about 5 months ago and has been doing PT since then. Very active, even though he has retired. 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 Recently started back to work. Was blowing leaves last Monday and began to feel weak and somewhat dyspneic. Was then sitting at home on Thursday night and had an episode of left sided chest pain that lasted a couple of minutes. On 08/15/16 he was at the Urology office, walked into check in, then sat down and developed left sided chest pressure "like something was sitting on his chest". Blood pressure was in the 200s. He was given SL nitro and morphine with improvement but not resolution of pain.   In the ED labs showed stable electrolytes, BNP 29, Trop negx1, Hgb 14.1., CXR negative. EKG showed SR with minimal J point elevation in inferior leads, noted on previous EKGs. Still reported a 5/10 chest pain on exam.  Does have pain with movement. He was admitted with plans for cardiac catheterization.   Hospital Course     Consultants: None  His troponins were cycled and negative x3. Underwent LHC with Dr. Burt Dunn on 08/16/16 showing 3 vessel disease with moderate stenosis in the mLAD/second diag, RI, ostial/pLcx with severe disease in the second PLA branch of the  RCA. Normal LVEDP. Plan for aggressive medical therapy. He was started on Imdur post cath. Blood pressure tolerated well. No further episodes of chest pain after being started on Imdur. Echo this admission showed normal EF with G2DD and no WMA.  He was seen by Dr. Gwenlyn Dunn on 08/17/16 and determined stable for discharge home. Will plan for outpatient exercise stress myoview, with consideration for TCTS  If high risk. Follow up in the office was arranged.  _____________  Discharge Vitals Blood pressure (!) 147/85, pulse (!) 55, temperature 97.9 F (36.6 C), temperature source Oral, resp. rate 18, height 5\' 7"  (1.702 m), weight 221 lb 1.6 oz (100.3 kg), SpO2 100 %.  Filed Weights   08/15/16 1800 08/16/16 0619 08/17/16 0545  Weight: 218 lb 7 oz (99.1 kg) 220 lb 4.8 oz (99.9 kg) 221 lb 1.6 oz (100.3 kg)    Labs & Radiologic Studies    CBC  Recent Labs  08/15/16 1102 08/16/16 0504  WBC 7.0 6.2  HGB 14.1 13.5  HCT 41.7 41.0  MCV 89.3 90.3  PLT 242 937   Basic Metabolic Panel  Recent Labs  08/15/16 1102 08/16/16 0504  NA 136 139  K 4.2 4.0  CL 108 106  CO2 18* 22  GLUCOSE 88 101*  BUN 17 18  CREATININE 1.09 1.23  CALCIUM 9.4 9.3   Liver Function Tests No results for input(s): AST, ALT, ALKPHOS, BILITOT, PROT, ALBUMIN in the last 72 hours. No results for input(s): LIPASE, AMYLASE in the last 72 hours. Cardiac Enzymes  Recent Labs  08/15/16 1822 08/15/16 2340 08/16/16 0504  TROPONINI <0.03 <0.03 <0.03   BNP Invalid input(s): POCBNP D-Dimer No results for input(s): DDIMER in the last 72 hours. Hemoglobin A1C No results for input(s): HGBA1C in the last 72 hours. Fasting Lipid Panel  Recent Labs  08/16/16 0504  CHOL 148  HDL 36*  LDLCALC 84  TRIG 138  CHOLHDL 4.1   Thyroid Function Tests No results for input(s): TSH, T4TOTAL, T3FREE, THYROIDAB in the last 72 hours.  Invalid input(s): FREET3 _____________  Dg Chest 2 View  Result Date: 08/15/2016 CLINICAL  DATA:  Previous renal carcinoma on left. Left-sided chest pain EXAM: CHEST  2 VIEW COMPARISON:  August 02, 2015 FINDINGS: There is a questionable nipple shadow on the left. There is mild scarring in the left base. Lungs elsewhere clear. Heart size and pulmonary vascularity are normal. No adenopathy. There is degenerative change in the thoracic spine. No blastic or lytic bone lesions. There is a total shoulder replacement right. There is postoperative change in the lower cervical region. There is calcification in each carotid artery as well as aortic atherosclerosis. IMPRESSION: Question nipple shadow on the left; advise repeat study with nipple markers to confirm. Mild scarring left base. No edema or consolidation. No adenopathy. There is aortic atherosclerosis as well as bilateral carotid artery calcification. Electronically Signed   By: Lowella Grip III M.D.   On: 08/15/2016 10:50   Disposition   Pt is being discharged home today in good condition.  Follow-up Plans & Appointments    Follow-up Information    Almyra Deforest,  PA Follow up on 08/26/2016.   Specialties:  Cardiology, Radiology Why:  at [facility] for your hospital follow up appt Contact information: [address] [place] [address] [place] Follow up on 08/23/2016.   Specialty:  Cardiology Why:  at 8am for your follow up stress test.  Contact information: [address] [place] [phone]         Discharge Instructions    Call MD for:  redness, tenderness, or signs of infection (pain, swelling, redness, odor or green/yellow discharge around incision site)    Complete by:  As directed    Diet - low sodium heart healthy    Complete by:  As directed    Discharge instructions    Complete by:  As directed    Radial Site Care Refer to this sheet in the next few weeks. These instructions provide you with information on caring for yourself  after your procedure. Your caregiver may also give you more specific instructions. Your treatment has been planned according to current medical practices, but problems sometimes occur. Call your caregiver if you have any problems or questions after your procedure. HOME CARE INSTRUCTIONS You may shower the day after the procedure.Remove the bandage (dressing) and gently wash the site with plain soap and water.Gently pat the site dry.  Do not apply powder or lotion to the site.  Do not submerge the affected site in water for 3 to 5 days.  Inspect the site at least twice daily.  Do not flex or bend the affected arm for 24 hours.  No lifting over 5 pounds (2.3 kg) for 5 days after your procedure.  Do not drive home if you are discharged the same day of the procedure. Have someone else drive you.  You may drive 24 hours after the procedure unless otherwise instructed by your caregiver.  What to expect: Any bruising will usually fade within 1 to 2 weeks.  Blood that collects in the tissue (hematoma) may be painful to the touch. It should usually decrease in size and tenderness within 1 to 2 weeks.  SEEK IMMEDIATE MEDICAL CARE IF: You have unusual pain at the radial site.  You have redness, warmth, swelling, or pain at the radial site.  You have drainage (other than a small amount of blood on the dressing).  You have chills.  You have a fever or persistent symptoms for more than 72 hours.  You have a fever and your symptoms suddenly get worse.  Your arm becomes pale, cool, tingly, or numb.  You have heavy bleeding from the site. Hold pressure on the site.     You have a Stress Test scheduled at [facility]. Your doctor has ordered this test to check the blood flow in your heart arteries.  Please arrive 15 minutes early for paperwork. The whole test will take several hours. You may want to bring reading material to remain occupied while undergoing different parts of the  test.  Instructions: No food/drink after midnight the night before. It is OK to take your morning meds with a sip of water EXCEPT for those types of medicines listed below or otherwise instructed. No caffeine/decaf products 24 hours before, including medicines such as Excedrin or Goody Powders. Call if there are any questions.  Wear comfortable clothes and shoes.   Special Medication Instructions: Beta blockers such as metoprolol (Lopressor/Toprol XL), atenolol (Tenormin), carvedilol (  Coreg), nebivolol (Bystolic), bisoprolol (Zebeta), propranolol (Inderal) should not be taken for 24 hours before the test. Calcium channel blockers such as diltiazem (Cardizem) or verapmil (Calan) should not be taken for 24 hours before the test. Remove nitroglycerin patches and do not take nitrate preparations such as Imdur/isosorbide the day of your test. No Persantine/Theophylline or Aggrenox medicines should be used within 24 hours of the test.  If you are diabetic, please ask which medications to hold the day of the test.  What To Expect: When you arrive in the lab, the technician will inject a small amount of radioactive tracer into your arm through an IV while you are resting quietly. This helps [place] to form pictures of your heart. You will likely only feel a sting from the IV. After a waiting period, resting pictures will be obtained under a big camera. These are the "before" pictures.  Next, you will be prepped for the stress portion of the test. This may include either walking on a treadmill or receiving a medicine that helps to dilate blood vessels in your heart to simulate the effect of exercise on your heart. If you are walking on a treadmill, you will walk at different paces to try to get your heart rate to a goal number that is based on your age. If your doctor has chosen the pharmacologic test, then you will receive a medicine through your IV that may cause temporary nausea, flushing, shortness of breath  and sometimes chest discomfort or vomiting. This is typically short-lived and usually resolves quickly. If you experience symptoms, that does not automatically mean the test is abnormal. Some patients do not experience any symptoms at all. Your blood pressure and heart rate will be monitored, and we will be watching your EKG on a computer screen for any changes. During this portion of the test, the radiologist will inject another small amount of radioactive tracer into your IV. After a waiting period, you will undergo a second set of pictures. These are the "after" pictures.  The doctor reading the test will compare the before-and-after images to look for evidence of heart blockages or heart weakness. The test usually takes 1 day to complete, but in certain instances (for example, if a patient is over a certain weight limit), the test may be done over the span of 2 days.   Increase activity slowly    Complete by:  As directed       Discharge Medications   Current Discharge Medication List    START taking these medications   Details  aspirin EC 81 MG EC tablet Take 1 tablet (81 mg total) by mouth daily.    isosorbide mononitrate (IMDUR) 30 MG 24 hr tablet Take 1 tablet (30 mg total) by mouth daily. Qty: 30 tablet, Refills: 6    nitroGLYCERIN (NITROSTAT) 0.4 MG SL tablet Place 1 tablet (0.4 mg total) under the tongue every 5 (five) minutes x 3 doses as needed for chest pain. Qty: 25 tablet, Refills: 2      CONTINUE these medications which have CHANGED   Details  atorvastatin (LIPITOR) 80 MG tablet Take 1 tablet (80 mg total) by mouth every evening. Qty: 30 tablet, Refills: 6    metoprolol (LOPRESSOR) 50 MG tablet Take 1 tablet (50 mg total) by mouth 2 (two) times daily. Qty: 60 tablet, Refills: 6      CONTINUE these medications which have NOT CHANGED   Details  allopurinol (ZYLOPRIM) 300 MG tablet Take 300 mg by  mouth daily.    clopidogrel (PLAVIX) 75 MG tablet Take 75 mg by mouth  daily.    LORazepam (ATIVAN) 0.5 MG tablet Take 0.5 mg by mouth 2 (two) times daily as needed for anxiety.  Refills: 2    pantoprazole (PROTONIX) 40 MG tablet Take 40 mg by mouth daily.    valsartan (DIOVAN) 320 MG tablet Take 320 mg by mouth daily. Refills: 1    EPINEPHrine 0.3 mg/0.3 mL IJ SOAJ injection USE AS DIRECTED IF LIFE THREATENING REACTION OCCURS Refills: 11   Associated Diagnoses: Angioedema, initial encounter      STOP taking these medications     acyclovir (ZOVIRAX) 400 MG tablet      hydrochlorothiazide (MICROZIDE) 12.5 MG capsule      ondansetron (ZOFRAN) 4 MG tablet      oxyCODONE-acetaminophen (PERCOCET) 5-325 MG tablet           Outstanding Labs/Studies   Exercise myoview  Duration of Discharge Encounter   Greater than 30 minutes including physician time.  Signed, Reino Bellis NP-C 08/17/2016, 12:20 PM

## 2016-08-16 NOTE — Care Management CC44 (Signed)
Condition Code 44 Documentation Completed  Patient Details  Name: BERNICE MULLIN MRN: [id number] Date of Birth: 1940-12-20   Condition Code 44 given:  Yes Patient signature on Condition Code 44 notice:   (patient refused to sign) Documentation of 2 MD's agreement:  Yes Code 44 added to claim:  Yes    Royston Bake, RN 08/16/2016, 3:35 PM

## 2016-08-16 NOTE — Progress Notes (Signed)
    Patient underwent LHC today with diffuse moderate 3vessel disease, normal LVEDP. Plan for aggressive medical therapy. Imdur added post cath, blood pressures noted soft with this addition. Given medication change with keep overnight to monitor adjustment to medications and for further chest pain.   SignedReino Bellis, NP-C 08/16/2016, 3:14 PM Pager: [phone]

## 2016-08-16 NOTE — Progress Notes (Signed)
Patient with sinus pause of 1:55. MD notified. Patient resting quietly at this time.

## 2016-08-17 ENCOUNTER — Encounter (HOSPITAL_COMMUNITY): Payer: Self-pay | Admitting: Cardiology

## 2016-08-17 ENCOUNTER — Other Ambulatory Visit: Payer: Self-pay | Admitting: Cardiology

## 2016-08-17 ENCOUNTER — Observation Stay (HOSPITAL_BASED_OUTPATIENT_CLINIC_OR_DEPARTMENT_OTHER): Payer: PPO

## 2016-08-17 DIAGNOSIS — R079 Chest pain, unspecified: Secondary | ICD-10-CM

## 2016-08-17 DIAGNOSIS — I200 Unstable angina: Secondary | ICD-10-CM | POA: Diagnosis not present

## 2016-08-17 LAB — ECHOCARDIOGRAM COMPLETE
CHL CUP DOP CALC LVOT VTI: 22.8 cm
CHL CUP MV DEC (S): 259
E/e' ratio: 10.48
EWDT: 259 ms
FS: 29 % (ref 28–44)
Height: 67 in
IV/PV OW: 1.04
LA diam end sys: 46 mm
LADIAMINDEX: 2.08 cm/m2
LASIZE: 46 mm
LAVOL: 105 mL
LAVOLA4C: 83 mL
LAVOLIN: 47.4 mL/m2
LV E/e'average: 10.48
LV PW d: 12.7 mm — AB (ref 0.6–1.1)
LV e' LATERAL: 9.73 cm/s
LVEEMED: 10.48
LVOT SV: 87 mL
LVOT area: 3.8 cm2
LVOT peak vel: 93.8 cm/s
LVOTD: 22 mm
Lateral S' vel: 11.9 cm/s
MV Peak grad: 4 mmHg
MV pk E vel: 102 m/s
MVPKAVEL: 86.3 m/s
RV sys press: 30 mmHg
Reg peak vel: 236 cm/s
TAPSE: 25.7 mm
TDI e' lateral: 9.73
TDI e' medial: 7.15
TR max vel: 236 cm/s
Weight: 3537.6 oz

## 2016-08-17 MED ORDER — ATORVASTATIN CALCIUM 80 MG PO TABS: 80.0000 mg | ORAL_TABLET | Freq: Every evening | ORAL | 6 refills | Status: DC

## 2016-08-17 MED ORDER — PERFLUTREN LIPID MICROSPHERE
1.0000 mL | INTRAVENOUS | Status: AC | PRN
  Administered 2016-08-17: 2 mL via INTRAVENOUS
  Filled 2016-08-17: qty 10

## 2016-08-17 MED ORDER — ASPIRIN 81 MG PO TBEC: 81.0000 mg | DELAYED_RELEASE_TABLET | Freq: Every day | ORAL | Status: DC

## 2016-08-17 MED ORDER — ISOSORBIDE MONONITRATE ER 30 MG PO TB24: 30.0000 mg | ORAL_TABLET | Freq: Every day | ORAL | 6 refills | Status: DC

## 2016-08-17 MED ORDER — NITROGLYCERIN 0.4 MG SL SUBL
0.4000 mg | SUBLINGUAL_TABLET | SUBLINGUAL | 2 refills | Status: DC | PRN
Start: 2016-08-17 — End: 2019-01-08

## 2016-08-17 MED ORDER — METOPROLOL TARTRATE 50 MG PO TABS: 50.0000 mg | ORAL_TABLET | Freq: Two times a day (BID) | ORAL | 6 refills | Status: DC

## 2016-08-17 NOTE — Progress Notes (Signed)
  Echocardiogram 2D Echocardiogram has been performed.  Donata Clay 08/17/2016, 11:35 AM

## 2016-08-17 NOTE — Progress Notes (Signed)
Call placed to [facility] to notify of telemetry monitoring d/c.   

## 2016-08-17 NOTE — Progress Notes (Signed)
Progress Note  Patient Name: Jake Dunn Date of Encounter: 08/17/2016  Primary Cardiologist: [place]  Subjective   76 yo male with PMH of Nonobstructive CAD (70% 69rd diag '14), GERD, HTN, HL, renal cell CA (left kidney removed) who presented with chest pain. He underwent cardiac catheterization radially yesterday by Dr. Burt Knack revealing multivessel disease. No culprit lesion could be identified. Medical therapy was recommended.  Inpatient Medications    Scheduled Meds: . aspirin EC  81 mg Oral Daily  . atorvastatin  80 mg Oral QPM  . clopidogrel  75 mg Oral Daily  . irbesartan  300 mg Oral Daily  . isosorbide mononitrate  30 mg Oral Daily  . metoprolol  50 mg Oral BID  . pantoprazole  40 mg Oral Daily  . sodium chloride flush  3 mL Intravenous Q12H   Continuous Infusions:  PRN Meds: sodium chloride, acetaminophen, LORazepam, nitroGLYCERIN, ondansetron (ZOFRAN) IV, sodium chloride flush, zolpidem   Vital Signs    Vitals:   08/16/16 1524 08/16/16 2114 08/17/16 0022 08/17/16 0545  BP: 122/62 127/69 (!) 105/55 131/73  Pulse: 64 (!) 59 60 69  Resp:  18 18 18   Temp:   97.7 F (36.5 C) 98.3 F (36.8 C)  TempSrc:   Oral Oral  SpO2:  100% 100% 99%  Weight:    221 lb 1.6 oz (100.3 kg)  Height:        Intake/Output Summary (Last 24 hours) at 08/17/16 1104 Last data filed at 08/17/16 0900  Gross per 24 hour  Intake             1200 ml  Output             2300 ml  Net            -1100 ml   Filed Weights   08/15/16 1800 08/16/16 0619 08/17/16 0545  Weight: 218 lb 7 oz (99.1 kg) 220 lb 4.8 oz (99.9 kg) 221 lb 1.6 oz (100.3 kg)    Telemetry    Normal sinus rhythm - Personally Reviewed  ECG    Not performed today - Personally Reviewed  Physical Exam   GEN: No acute distress.   Neck: No JVD Cardiac: RRR, no murmurs, rubs, or gallops.  Respiratory: Clear to auscultation bilaterally. GI: Soft, nontender, non-distended  MS: No edema; No deformity. Neuro:   Nonfocal  Psych: Normal affect  Right radial artery puncture site okay  Labs    Chemistry Recent Labs Lab 08/15/16 1102 08/16/16 0504  NA 136 139  K 4.2 4.0  CL 108 106  CO2 18* 22  GLUCOSE 88 101*  BUN 17 18  CREATININE 1.09 1.23  CALCIUM 9.4 9.3  GFRNONAA >60 56*  GFRAA >60 >60  ANIONGAP 10 11     Hematology Recent Labs Lab 08/15/16 1102 08/16/16 0504  WBC 7.0 6.2  RBC 4.67 4.54  HGB 14.1 13.5  HCT 41.7 41.0  MCV 89.3 90.3  MCH 30.2 29.7  MCHC 33.8 32.9  RDW 14.0 14.3  PLT 242 232    Cardiac Enzymes Recent Labs Lab 08/15/16 1822 08/15/16 2340 08/16/16 0504  TROPONINI <0.03 <0.03 <0.03    Recent Labs Lab 08/15/16 1114 08/15/16 1444  TROPIPOC 0.00 0.00     BNP Recent Labs Lab 08/15/16 1102  BNP 29.4     DDimer No results for input(s): DDIMER in the last 168 hours.   Radiology    No results found.  Cardiac Studies   Cath:  Conclusion   1. 3 vessel CAD with moderate calcified stenoses of the mid-LAD/second diagonal, ramus intermedius, ostial and proximal circumflex, and severe stenosis of the second PLA branch of the RCA 2. Normal LV systolic function and normal LVEDP  Recommend: Aggressive medical therapy. Outpatient exercise stress myoview. Consider TCTS evaluation for CABG if Myoview high-risk. Otherwise continue with medical therapy.  While the LAD/diag only show modest progression compared to 2010 and 2014 cath studies, the left circumflex/intermediate stenoses have progressed.      Patient Profile     76 y.o. male with history of nonobstructive CAD by cath in the past 2014, GERD, hypertension, hyperlipidemia and renal cell carcinoma presented with chest pain. His enzymes were negative. He underwent cardiac catheterization radially but Dr. Burt Knack revealing multivessel disease without a "culprit lesion and medical therapy was recommended.  Assessment & Plan    1: Coronary artery disease-multivessel disease by prior  catheterization yesterday without culprit lesion identified. After reviewing the films with Dr. Burt Knack the decision was aggressive medical therapy with outpatient Myoview stress test. If revascularization is required he would need coronary artery bypass grafting. He'll be discharged home today.    Angelina Sheriff, MD  08/17/2016, 11:04 AM

## 2016-08-18 ENCOUNTER — Telehealth (HOSPITAL_COMMUNITY): Payer: Self-pay

## 2016-08-18 NOTE — Telephone Encounter (Signed)
Encounter complete. 

## 2016-08-23 ENCOUNTER — Ambulatory Visit (HOSPITAL_COMMUNITY)
Admission: RE | Admit: 2016-08-23 | Discharge: 2016-08-23 | Disposition: A | Discharge status: Home / Self Care | Payer: PPO | Source: Ambulatory Visit | Attending: Cardiology | Admitting: Cardiology

## 2016-08-23 DIAGNOSIS — R079 Chest pain, unspecified: Secondary | ICD-10-CM | POA: Diagnosis not present

## 2016-08-23 LAB — MYOCARDIAL PERFUSION IMAGING
CHL CUP NUCLEAR SDS: 2
CHL CUP NUCLEAR SRS: 0
CHL CUP NUCLEAR SSS: 2
CHL CUP RESTING HR STRESS: 65 {beats}/min
LVDIAVOL: 112 mL (ref 62–150)
LVSYSVOL: 54 mL
NUC STRESS TID: 1.1
Peak HR: 101 {beats}/min

## 2016-08-23 MED ORDER — TECHNETIUM TC 99M TETROFOSMIN IV KIT
10.4000 | PACK | Freq: Once | INTRAVENOUS | Status: AC | PRN
  Administered 2016-08-23: 10.4 via INTRAVENOUS
  Filled 2016-08-23: qty 11

## 2016-08-23 MED ORDER — AMINOPHYLLINE 25 MG/ML IV SOLN
75.0000 mg | Freq: Once | INTRAVENOUS | Status: AC
  Administered 2016-08-23: 75 mg via INTRAVENOUS

## 2016-08-23 MED ORDER — TECHNETIUM TC 99M TETROFOSMIN IV KIT
30.1000 | PACK | Freq: Once | INTRAVENOUS | Status: AC | PRN
  Administered 2016-08-23: 30.1 via INTRAVENOUS
  Filled 2016-08-23: qty 31

## 2016-08-23 MED ORDER — REGADENOSON 0.4 MG/5ML IV SOLN
0.4000 mg | Freq: Once | INTRAVENOUS | Status: AC
  Administered 2016-08-23: 0.4 mg via INTRAVENOUS

## 2016-08-26 ENCOUNTER — Encounter: Payer: Self-pay | Admitting: Physician Assistant

## 2016-08-26 ENCOUNTER — Ambulatory Visit (INDEPENDENT_AMBULATORY_CARE_PROVIDER_SITE_OTHER): Payer: PPO | Admitting: Physician Assistant

## 2016-08-26 VITALS — BP 152/94 | HR 70 | Ht 67.0 in | Wt 225.0 lb

## 2016-08-26 DIAGNOSIS — R079 Chest pain, unspecified: Secondary | ICD-10-CM | POA: Diagnosis not present

## 2016-08-26 DIAGNOSIS — E785 Hyperlipidemia, unspecified: Secondary | ICD-10-CM | POA: Diagnosis not present

## 2016-08-26 DIAGNOSIS — I25118 Atherosclerotic heart disease of native coronary artery with other forms of angina pectoris: Secondary | ICD-10-CM

## 2016-08-26 DIAGNOSIS — I10 Essential (primary) hypertension: Secondary | ICD-10-CM | POA: Diagnosis not present

## 2016-08-26 DIAGNOSIS — Z79899 Other long term (current) drug therapy: Secondary | ICD-10-CM

## 2016-08-26 MED ORDER — ISOSORBIDE MONONITRATE ER 60 MG PO TB24: 60.0000 mg | ORAL_TABLET | Freq: Every day | ORAL | 6 refills | Status: DC

## 2016-08-26 MED ORDER — AMLODIPINE BESYLATE 5 MG PO TABS: 5.0000 mg | ORAL_TABLET | Freq: Every day | ORAL | 3 refills | Status: DC

## 2016-08-26 NOTE — Progress Notes (Signed)
Cardiology Office Note    Date:  08/27/2016   ID:  Jake Dunn, Jake Dunn [birth year], MRN [id number]  PCP:  Vidal Schwalbe, MD  Cardiologist:  Dr. Martinique  Chief Complaint  Patient presents with  . Follow-up    pt c/o chest pressure and fluctuating BP    History of Present Illness:  Jake Dunn is a 76 y.o. male with PMH of nonobstructive CAD (70% 76rd diag '14), GERD, HTN, HL, renal cell CA s/p left nephrectomy. Last cardiac catheterization in 2014 showed nonobstructive CAD with 70% lesion in third diagonal that was treated medically. He is fairly active and recently undergoing physical therapy after his right shoulder surgery 5 months ago. He presented with left sided chest pressure on 08/15/2016 while at Urology office. He underwent cardiac catheterization on 08/16/2016, this revealed two-vessel CAD with moderate calcified stenosis in the mid LAD, second diagonal, ramus intermedius, ostial and proximal left circumflex and a severe stenosis of the second PLA branch of the RCA. Overall normal LVEDP and normal LVEF. Recommended aggressive medical therapy and outpatient exercise stress Myoview and consider TCTS evaluation for CABG if Myoview came back high risk. While the LAD and the diagonal only showed moderate progression compared to the 2010/2014 studies, the left circumflex and intermediate stenosis has progressed. Echocardiogram obtained on 08/17/2016 showed EF 81-82%, grade 2 diastolic dysfunction, severely dilated left atrium, PA peak pressure 30 mmHg. Lexiscan Myoview obtained on 08/23/2016 showed EF 51%, no perfusion defect at stress, overall low risk study.   Note lipid panel showed total cholesterol 148, HDL 36, LDL 84, glucose 138. His goal for LDL should be less than 70. Apparently, patient has stopped his Lipitor due to muscle aches a while back, he has restarted on the Lipitor. We will check a fasting lipid panel and LFTs in 6 weeks. Since he left the hospital, despite the negative  Myoview, he continued to have almost daily onset of chest discomfort. He actually had 2 types of chest discomfort. The first type is a bandlike tightness around his waist, it persisted and almost never goes away. It is worse with deep inspiration. This is fairly atypical in that it is unlikely to be related to the heart. The second type is a sharp pain also around the waist area. It occurs with minimal amount of exertion. He says it comes on sometimes when he goes to the bathroom, and also comes on when he has to push a self propelled lawnmower. Yesterday, he had chest discomfort when he was raking leaves in the yard. He has to stop. I did discuss the case with DOD Dr. Oval Linsey, we will uptitrate medication therapy at this time. We've increased her Imdur to 60 mg daily and add amlodipine 5 mg daily for antianginal purposes. I asked him to monitor his blood pressure after starting on the new medication. We will bring him back to see Dr. Martinique in early May for reassessment. If he fails medical therapy, he will need bypass surgery.   Past Medical History:  Diagnosis Date  . Adenomatous polyp   . Anxiety   . Arthritis    "knees, ankles, shoulders" (08/15/2016)  . CAD (coronary artery disease)    s/p cath in 2014 showing significant stenosis in the diagonal side branches and in the RV marginal branch. These vessels were small and diffusely diseased. He is managed medically. 08/16/16 Cath, no disease progression  . Chronic lower back pain   . Disc disease, degenerative, cervical   .  GERD (gastroesophageal reflux disease)   . Gout   . Hyperlipidemia   . Hypertension   . Osteoarthritis   . Renal cell carcinoma    left kidney removal  . Stroke [facility])    MINI STROKE 15+ YRS AGO, no residual  . Stroke ([place])    "I've had 2 or 3"; denies residual on 08/15/2016  . Syncope and collapse     Past Surgical History:  Procedure Laterality Date  . ANTERIOR CERVICAL DECOMP/DISCECTOMY FUSION  02/2004; 04/2005    [name] 5/14/2012Marland Kitchen [name] 09/20/2010  . BACK SURGERY    . CARDIAC CATHETERIZATION  12/14/2008   ef 50-55%. SHOWED SIGNIFICANT STENOSIS AND TO DIAGONAL SIDE BRANCHES AND IN THE RIGHT VENTRICULAR MARGINAL BRANCH. THESE VESSELS WERE SMALL AND DIFFUSELY DISEASED  . CARDIOVASCULAR STRESS TEST  12/10/2009   EF 61%. EKG negative. Mild peri ischemia. Managed medically.   . CARPAL TUNNEL RELEASE Right 11/2005   [name] 09/20/2010  . CARPAL TUNNEL RELEASE Left 06/2007   [name] 5/132012  . COLONOSCOPY  11/2004   [name] 09/20/2010  . COLONOSCOPY W/ BIOPSIES AND POLYPECTOMY  09/2002   [name] 09/20/2010  . INCISION AND DRAINAGE OF WOUND Right 08/2005   shoulder/notes 09/20/2010  . JOINT REPLACEMENT    . LEFT HEART CATH AND CORONARY ANGIOGRAPHY N/A 08/16/2016   Procedure: Left Heart Cath and Coronary Angiography;  Surgeon: [name], MD;  Location: [facility];  Service: Cardiovascular;  Laterality: N/A;  . LEFT HEART CATHETERIZATION WITH CORONARY ANGIOGRAM N/A 08/09/2012   Procedure: LEFT HEART CATHETERIZATION WITH CORONARY ANGIOGRAM;  Surgeon: Peter M Martinique, MD;  Location: [facility] CATH [facility];  Service: Cardiovascular;  Laterality: N/A;  . LUMBAR FUSION    . NEPHRECTOMY Left early 2000s  . REPLACEMENT TOTAL KNEE Right 08/2008   [name] 09/07/2010  . SHOULDER ARTHROSCOPY WITH ROTATOR CUFF REPAIR Right 06/2005   [name] 09/20/2010  . TOTAL KNEE ARTHROPLASTY  06/08/2011   Procedure: TOTAL KNEE ARTHROPLASTY;  Surgeon: [name], MD;  Location: [facility];  Service: Orthopedics;  Laterality: Left;  . TOTAL SHOULDER ARTHROPLASTY Right 04/06/2016   Procedure: RIGHT REVERSE TOTAL SHOULDER ARTHROPLASTY;  Surgeon: [name], MD;  Location: [place];  Service: Orthopedics;  Laterality: Right;  . US ECHOCARDIOGRAPHY  12/15/2008   EF 60-65%  . US ECHOCARDIOGRAPHY  09/28/2004   EF 55-60%    Current Medications: Outpatient Medications Prior to Visit  Medication Sig Dispense Refill  . allopurinol (ZYLOPRIM) 300 MG  tablet Take 300 mg by mouth daily.    Marland Kitchen aspirin EC 81 MG EC tablet Take 1 tablet (81 mg total) by mouth daily.    Marland Kitchen atorvastatin (LIPITOR) 80 MG tablet Take 1 tablet (80 mg total) by mouth every evening. 30 tablet 6  . clopidogrel (PLAVIX) 75 MG tablet Take 75 mg by mouth daily.    Marland Kitchen EPINEPHrine 0.3 mg/0.3 mL IJ SOAJ injection USE AS DIRECTED IF LIFE THREATENING REACTION OCCURS  11  . LORazepam (ATIVAN) 0.5 MG tablet Take 0.5 mg by mouth 2 (two) times daily as needed for anxiety.   2  . metoprolol (LOPRESSOR) 50 MG tablet Take 1 tablet (50 mg total) by mouth 2 (two) times daily. 60 tablet 6  . nitroGLYCERIN (NITROSTAT) 0.4 MG SL tablet Place 1 tablet (0.4 mg total) under the tongue every 5 (five) minutes x 3 doses as needed for chest pain. 25 tablet 2  . pantoprazole (PROTONIX) 40 MG tablet Take 40 mg by mouth daily.    Marland Kitchen  valsartan (DIOVAN) 320 MG tablet Take 320 mg by mouth daily.  1  . isosorbide mononitrate (IMDUR) 30 MG 24 hr tablet Take 1 tablet (30 mg total) by mouth daily. 30 tablet 6   No facility-administered medications prior to visit.      Allergies:   Adhesive [tape] and Latex   Social History   Social History  . Marital status: Married    Spouse name: N/A  . Number of children: N/A  . Years of education: N/A   Social History Main Topics  . Smoking status: Former Smoker    Quit date: 05/31/1989  . Smokeless tobacco: Never Used     Comment: 08/15/2016 "hadn't smoked a carton of cigarettes all my life"  . Alcohol use 0.0 oz/week     Comment: 08/15/2016 "did have 6-8 beers per day, has not had any since end of October 2017  . Drug use: No  . Sexual activity: Not Currently   Other Topics Concern  . None   Social History Narrative  . None     Family History:  The patient's family history includes Heart attack in his father; Heart disease in his brother.   ROS:   Please see the history of present illness.    ROS All other systems reviewed and are negative.   PHYSICAL  EXAM:   VS:  BP (!) 152/94   Pulse 70   Ht 5\' 7"  (1.702 m)   Wt 225 lb (102.1 kg)   BMI 35.24 kg/m    GEN: Well nourished, well developed, in no acute distress  HEENT: normal  Neck: no JVD, carotid bruits, or masses Cardiac: RRR; no murmurs, rubs, or gallops,no edema  Respiratory:  clear to auscultation bilaterally, normal work of breathing GI: soft, nontender, nondistended, + BS MS: no deformity or atrophy  Skin: warm and dry, no rash Neuro:  Alert and Oriented x 3, Strength and sensation are intact Psych: euthymic mood, full affect  Wt Readings from Last 3 Encounters:  08/26/16 225 lb (102.1 kg)  08/23/16 221 lb (100.2 kg)  08/17/16 221 lb 1.6 oz (100.3 kg)      Studies/Labs Reviewed:   EKG:  EKG is not ordered today.   Recent Labs: 03/25/2016: ALT 15 08/15/2016: B Natriuretic Peptide 29.4 08/16/2016: BUN 18; Creatinine, Ser 1.23; Hemoglobin 13.5; Platelets 232; Potassium 4.0; Sodium 139   Lipid Panel    Component Value Date/Time   CHOL 148 08/16/2016 0504   TRIG 138 08/16/2016 0504   HDL 36 (L) 08/16/2016 0504   CHOLHDL 4.1 08/16/2016 0504   VLDL 28 08/16/2016 0504   LDLCALC 84 08/16/2016 0504    Additional studies/ records that were reviewed today include:   Cath 08/16/2016 Conclusion   1. 3 vessel CAD with moderate calcified stenoses of the mid-LAD/second diagonal, ramus intermedius, ostial and proximal circumflex, and severe stenosis of the second PLA branch of the RCA 2. Normal LV systolic function and normal LVEDP  Recommend: Aggressive medical therapy. Outpatient exercise stress myoview. Consider TCTS evaluation for CABG if Myoview high-risk. Otherwise continue with medical therapy.  While the LAD/diag only show modest progression compared to 2010 and 2014 cath studies, the left circumflex/intermediate stenoses have progressed.    Diagnostic Diagram         Echo 08/17/2016 LV EF: 55% -   60%  - Left ventricle: The cavity size was normal.  There was mild   concentric hypertrophy. Systolic function was normal. The   estimated ejection fraction was  in the range of 55% to 60%. Wall   motion was normal; there were no regional wall motion   abnormalities. Features are consistent with a pseudonormal left   ventricular filling pattern, with concomitant abnormal relaxation   and increased filling pressure (grade 2 diastolic dysfunction).   Doppler parameters are consistent with indeterminate ventricular   filling pressure. - Aortic valve: Transvalvular velocity was within the normal range.   There was no stenosis. There was trivial regurgitation. - Mitral valve: Transvalvular velocity was within the normal range.   There was no evidence for stenosis. There was trivial   regurgitation. - Left atrium: The atrium was severely dilated. - Right ventricle: The cavity size was normal. Wall thickness was   normal. Systolic function was normal. - Atrial septum: No defect or patent foramen ovale was identified. - Tricuspid valve: There was trivial regurgitation. - Pulmonary arteries: Systolic pressure was within the normal   range. PA peak pressure: 30 mm Hg (S).    Myoview 08/23/2016 Study Highlights     Nuclear stress EF: 51%. Mildly a synchronous contraction.  There was no ST segment deviation noted during stress. No perfusion defects at stress.  This is a low risk study. No evidence of ischemia identified.      ASSESSMENT:    1. Chest pain, unspecified type   2. Medication management   3. Coronary artery disease involving native coronary artery of native heart with other form of angina pectoris ([place])   4. Essential hypertension   5. Hyperlipidemia, unspecified hyperlipidemia type      PLAN:  In order of problems listed above:  1. Chest pain: He has 2 types of chest pain, the first type is a bandlike sensation in the lower chest that essentially persisted and never goes away, it is worse with deep inspiration, this  chest pain is very atypical and unlikely related to the heart. He has a second type of chest pain that is worse with minimal exertion which is concerning. I have discussed the case with the DOD Dr. Oval Linsey, we increased his Imdur to 60 mg and added amlodipine 5 mg for antianginal purposes. He will return to see Dr. Martinique, if he fails medical therapy, bypass surgery would be the next step.  2. CAD: Three-vessel disease on recent cardiac catheterization. Myoview after cardiac catheterization came back low risk without ischemia. He continued to have chest discomfort, we uptitrated antianginal medical therapy. If he is symptom shows no improvement on follow-up, he will need to be considered for bypass surgery.  3. HTN: Blood pressure 150s today, we increased the Imdur and added amlodipine for antianginal purposes. They should also help with the blood pressure.  4. HLD: Continue on Lipitor 80 mg daily. His recent lab work shows uncontrolled hyperlipidemia, it turned out he has not been taking his Lipitor for a while due to muscle ache. He has since restarted the previous Lipitor dose. He will need a fasting lipid panel and LFTs in 6 weeks.    Medication Adjustments/Labs and Tests Ordered: Current medicines are reviewed at length with the patient today.  Concerns regarding medicines are outlined above.  Medication changes, Labs and Tests ordered today are listed in the Patient Instructions below. Patient Instructions  Medication Instructions:  INCREASE IMDUR 60 MG DAILY ADD AMLODIPINE 5MG  DAILY  If you need a refill on your cardiac medications before your next appointment, please call your pharmacy.  Labwork: FLP AND LFT IN 6 WEEKS (~10-07-2016) AT [facility] ON THE  1ST FLOOR  Follow-Up: Your physician wants you to follow-up in: 09-07-2016 WITH DR Martinique @ 1140AM.   Special Instructions: MONITOR BP  Thank you for choosing [facility] at [facility], [place], [place]    08/27/2016 12:20 AM    [facility] [place], [place], [place]  [address] Phone: [phone]; Fax: [phone]

## 2016-08-26 NOTE — Patient Instructions (Addendum)
Medication Instructions:  INCREASE IMDUR 60 MG DAILY ADD AMLODIPINE 5MG  DAILY  If you need a refill on your cardiac medications before your next appointment, please call your pharmacy.  Labwork: FLP AND LFT IN 6 WEEKS (~10-07-2016) AT [facility] ON THE 1ST FLOOR  Follow-Up: Your physician wants you to follow-up in: 09-07-2016 WITH DR Martinique @ 1140AM.   Special Instructions: MONITOR BP  Thank you for choosing [facility] at [facility]!!

## 2016-08-27 ENCOUNTER — Encounter: Payer: Self-pay | Admitting: Physician Assistant

## 2016-09-02 DIAGNOSIS — M5134 Other intervertebral disc degeneration, thoracic region: Secondary | ICD-10-CM | POA: Diagnosis not present

## 2016-09-02 DIAGNOSIS — F419 Anxiety disorder, unspecified: Secondary | ICD-10-CM | POA: Diagnosis not present

## 2016-09-02 DIAGNOSIS — G894 Chronic pain syndrome: Secondary | ICD-10-CM | POA: Diagnosis not present

## 2016-09-02 DIAGNOSIS — I25119 Atherosclerotic heart disease of native coronary artery with unspecified angina pectoris: Secondary | ICD-10-CM | POA: Diagnosis not present

## 2016-09-02 DIAGNOSIS — E785 Hyperlipidemia, unspecified: Secondary | ICD-10-CM | POA: Diagnosis not present

## 2016-09-02 DIAGNOSIS — I10 Essential (primary) hypertension: Secondary | ICD-10-CM | POA: Diagnosis not present

## 2016-09-02 DIAGNOSIS — M5030 Other cervical disc degeneration, unspecified cervical region: Secondary | ICD-10-CM | POA: Diagnosis not present

## 2016-09-04 NOTE — Progress Notes (Signed)
Cardiology Office Note    Date:  09/07/2016   ID:  Santiel, Topper 1940/06/08, MRN [id number]  PCP:  Vidal Schwalbe, MD  Cardiologist:  Dr. Hollis Oh [place]  Chief Complaint  Patient presents with  . Chest Pain  . Coronary Artery Disease    History of Present Illness:  ARAEL Dunn is a 76 y.o. male seen for post hospital follow up. He has a  PMH of nonobstructive CAD in the past (70% 3rd diag '14), GERD, HTN, HL, renal cell CA s/p left nephrectomy. Last cardiac catheterization in 2014 showed nonobstructive CAD with 70% lesion in third diagonal that was treated medically. He is fairly active and recently undergoing physical therapy after his right shoulder surgery 5 months ago. He presented with left sided chest pressure on 08/15/2016 while at Urology office. BP was quite high that day. He underwent cardiac catheterization on 08/16/2016, this revealed two-vessel CAD with moderate calcified stenosis in the mid LAD, second diagonal, ramus intermedius, ostial and proximal left circumflex and a severe stenosis of the second PLA branch of the RCA. Overall normal LVEDP and normal LVEF. Recommended  medical therapy and outpatient exercise stress Myoview. While the LAD and the diagonal only showed moderate progression compared to the 2010/2014 studies, the left circumflex and intermediate stenosis had progressed. Echocardiogram obtained on 08/17/2016 showed EF 46-96%, grade 2 diastolic dysfunction, severely dilated left atrium, PA peak pressure 30 mmHg. Lexiscan Myoview obtained on 08/23/2016 showed EF 51%, no perfusion defect at stress, overall low risk study.   When seen April 20th his Imdur was increased and amlodipine was added. Since then the patient states he cannot function. He feels very fatigued and lightheaded. He feels he is drunk all the time. The only chest pain he has is in the rib cage lateral to the left breast that is localized, sharp and worse with movement or deep breathing. BP readings  at home have been low with some readings down to 90/54. In the past he has had problems with aggressive BP control with orthostatic hypotension and syncope.    Past Medical History:  Diagnosis Date  . Adenomatous polyp   . Anxiety   . Arthritis    "knees, ankles, shoulders" (08/15/2016)  . CAD (coronary artery disease)    s/p cath in 2014 showing significant stenosis in the diagonal side branches and in the RV marginal branch. These vessels were small and diffusely diseased. He is managed medically. 08/16/16 Cath, no disease progression  . Chronic lower back pain   . Disc disease, degenerative, cervical   . GERD (gastroesophageal reflux disease)   . Gout   . Hyperlipidemia   . Hypertension   . Osteoarthritis   . Renal cell carcinoma    left kidney removal  . Stroke [facility])    MINI STROKE 15+ YRS AGO, no residual  . Stroke ([place])    "I've had 2 or 3"; denies residual on 08/15/2016  . Syncope and collapse     Past Surgical History:  Procedure Laterality Date  . ANTERIOR CERVICAL DECOMP/DISCECTOMY FUSION  02/2004; 04/2005   [name] 5/14/2012Marland Kitchen [name] 09/20/2010  . BACK SURGERY    . CARDIAC CATHETERIZATION  12/14/2008   ef 50-55%. SHOWED SIGNIFICANT STENOSIS AND TO DIAGONAL SIDE BRANCHES AND IN THE RIGHT VENTRICULAR MARGINAL BRANCH. THESE VESSELS WERE SMALL AND DIFFUSELY DISEASED  . CARDIOVASCULAR STRESS TEST  12/10/2009   EF 61%. EKG negative. Mild peri ischemia. Managed medically.   . CARPAL TUNNEL RELEASE Right  11/2005   [name] 09/20/2010  . CARPAL TUNNEL RELEASE Left 06/2007   [name] 5/132012  . COLONOSCOPY  11/2004   [name] 09/20/2010  . COLONOSCOPY W/ BIOPSIES AND POLYPECTOMY  09/2002   [name] 09/20/2010  . INCISION AND DRAINAGE OF WOUND Right 08/2005   shoulder/notes 09/20/2010  . JOINT REPLACEMENT    . LEFT HEART CATH AND CORONARY ANGIOGRAPHY N/A 08/16/2016   Procedure: Left Heart Cath and Coronary Angiography;  Surgeon: [name], MD;  Location: [place] CV LAB;  Service:  Cardiovascular;  Laterality: N/A;  . LEFT HEART CATHETERIZATION WITH CORONARY ANGIOGRAM N/A 08/09/2012   Procedure: LEFT HEART CATHETERIZATION WITH CORONARY ANGIOGRAM;  Surgeon: Rahul Malinak M [place], MD;  Location: [facility] CATH LAB;  Service: Cardiovascular;  Laterality: N/A;  . LUMBAR FUSION    . NEPHRECTOMY Left early 2000s  . REPLACEMENT TOTAL KNEE Right 08/2008   [name] 09/07/2010  . SHOULDER ARTHROSCOPY WITH ROTATOR CUFF REPAIR Right 06/2005   [name] 09/20/2010  . TOTAL KNEE ARTHROPLASTY  06/08/2011   Procedure: TOTAL KNEE ARTHROPLASTY;  Surgeon: [name], MD;  Location: [place];  Service: Orthopedics;  Laterality: Left;  . TOTAL SHOULDER ARTHROPLASTY Right 04/06/2016   Procedure: RIGHT REVERSE TOTAL SHOULDER ARTHROPLASTY;  Surgeon: [name], MD;  Location: [place];  Service: Orthopedics;  Laterality: Right;  . US ECHOCARDIOGRAPHY  12/15/2008   EF 60-65%  . US ECHOCARDIOGRAPHY  09/28/2004   EF 55-60%    Current Medications: Outpatient Medications Prior to Visit  Medication Sig Dispense Refill  . allopurinol (ZYLOPRIM) 300 MG tablet Take 300 mg by mouth daily.    Marland Kitchen amLODipine (NORVASC) 5 MG tablet Take 1 tablet (5 mg total) by mouth daily. 30 tablet 3  . aspirin EC 81 MG EC tablet Take 1 tablet (81 mg total) by mouth daily.    Marland Kitchen atorvastatin (LIPITOR) 80 MG tablet Take 1 tablet (80 mg total) by mouth every evening. 30 tablet 6  . clopidogrel (PLAVIX) 75 MG tablet Take 75 mg by mouth daily.    Marland Kitchen EPINEPHrine 0.3 mg/0.3 mL IJ SOAJ injection USE AS DIRECTED IF LIFE THREATENING REACTION OCCURS  11  . isosorbide mononitrate (IMDUR) 60 MG 24 hr tablet Take 1 tablet (60 mg total) by mouth daily. 30 tablet 6  . LORazepam (ATIVAN) 0.5 MG tablet Take 0.5 mg by mouth 2 (two) times daily as needed for anxiety.   2  . metoprolol (LOPRESSOR) 50 MG tablet Take 1 tablet (50 mg total) by mouth 2 (two) times daily. 60 tablet 6  . nitroGLYCERIN (NITROSTAT) 0.4 MG SL tablet Place 1 tablet (0.4 mg total) under  the tongue every 5 (five) minutes x 3 doses as needed for chest pain. 25 tablet 2  . pantoprazole (PROTONIX) 40 MG tablet Take 40 mg by mouth daily.    . valsartan (DIOVAN) 320 MG tablet Take 320 mg by mouth daily.  1   No facility-administered medications prior to visit.      Allergies:   Adhesive [tape] and Latex   Social History   Social History  . Marital status: Married    Spouse name: N/A  . Number of children: N/A  . Years of education: N/A   Social History Main Topics  . Smoking status: Former Smoker    Quit date: 05/31/1989  . Smokeless tobacco: Never Used     Comment: 08/15/2016 "hadn't smoked a carton of cigarettes all my life"  . Alcohol use 0.0 oz/week     Comment:  08/15/2016 "did have 6-8 beers per day, has not had any since end of October 2017  . Drug use: No  . Sexual activity: Not Currently   Other Topics Concern  . None   Social History Narrative  . None     Family History:  The patient's family history includes Heart attack in his father; Heart disease in his brother.   ROS:   Please see the history of present illness.    ROS All other systems reviewed and are negative.   PHYSICAL EXAM:   VS:  BP 111/65   Pulse 62   Ht 5\' 7"  (1.702 m)   Wt 225 lb 9.6 oz (102.3 kg)   SpO2 97%   BMI 35.33 kg/m    GEN: Well nourished, well developed, in no acute distress  HEENT: normal  Neck: no JVD, carotid bruits, or masses Cardiac: RRR; no murmurs, rubs, or gallops,no edema  Respiratory:  clear to auscultation bilaterally, normal work of breathing GI: soft, nontender, nondistended, + BS MS: no deformity or atrophy  Skin: warm and dry, no rash Neuro:  Alert and Oriented x 3, Strength and sensation are intact Psych: euthymic mood, full affect  Wt Readings from Last 3 Encounters:  09/07/16 225 lb 9.6 oz (102.3 kg)  08/26/16 225 lb (102.1 kg)  08/23/16 221 lb (100.2 kg)      Studies/Labs Reviewed:   EKG:  EKG is ordered today. NSR with normal Ecg. I have  personally reviewed and interpreted this study.   Recent Labs: 03/25/2016: ALT 15 08/15/2016: B Natriuretic Peptide 29.4 08/16/2016: BUN 18; Creatinine, Ser 1.23; Hemoglobin 13.5; Platelets 232; Potassium 4.0; Sodium 139   Lipid Panel    Component Value Date/Time   CHOL 148 08/16/2016 0504   TRIG 138 08/16/2016 0504   HDL 36 (L) 08/16/2016 0504   CHOLHDL 4.1 08/16/2016 0504   VLDL 28 08/16/2016 0504   LDLCALC 84 08/16/2016 0504    Additional studies/ records that were reviewed today include:   Cath 08/16/2016 Conclusion   1. 3 vessel CAD with moderate calcified stenoses of the mid-LAD/second diagonal, ramus intermedius, ostial and proximal circumflex, and severe stenosis of the second PLA branch of the RCA 2. Normal LV systolic function and normal LVEDP  Recommend: Aggressive medical therapy. Outpatient exercise stress myoview. Consider TCTS evaluation for CABG if Myoview high-risk. Otherwise continue with medical therapy.  While the LAD/diag only show modest progression compared to 2010 and 2014 cath studies, the left circumflex/intermediate stenoses have progressed.    Diagnostic Diagram         Echo 08/17/2016 LV EF: 55% -   60%  - Left ventricle: The cavity size was normal. There was mild   concentric hypertrophy. Systolic function was normal. The   estimated ejection fraction was in the range of 55% to 60%. Wall   motion was normal; there were no regional wall motion   abnormalities. Features are consistent with a pseudonormal left   ventricular filling pattern, with concomitant abnormal relaxation   and increased filling pressure (grade 2 diastolic dysfunction).   Doppler parameters are consistent with indeterminate ventricular   filling pressure. - Aortic valve: Transvalvular velocity was within the normal range.   There was no stenosis. There was trivial regurgitation. - Mitral valve: Transvalvular velocity was within the normal range.   There was no  evidence for stenosis. There was trivial   regurgitation. - Left atrium: The atrium was severely dilated. - Right ventricle: The cavity size was  normal. Wall thickness was   normal. Systolic function was normal. - Atrial septum: No defect or patent foramen ovale was identified. - Tricuspid valve: There was trivial regurgitation. - Pulmonary arteries: Systolic pressure was within the normal   range. PA peak pressure: 30 mm Hg (S).    Myoview 08/23/2016 Study Highlights     Nuclear stress EF: 51%. Mildly a synchronous contraction.  There was no ST segment deviation noted during stress. No perfusion defects at stress.  This is a low risk study. No evidence of ischemia identified.      ASSESSMENT:    No diagnosis found.   PLAN:  In order of problems listed above:  1. Chest pain: He really has atypical chest pain that is more musculoskeletal. I don't get a history of any angina. His Myoview was low risk. I personally reviewed his cardiac angiogram and he does have moderate 3 vessel CAD but no critical disease. He is very active and his major limitation now is due to his low BP.   2. CAD: Three-vessel disease on recent cardiac catheterization- moderate. Myoview after cardiac catheterization came back low risk without ischemia. No real anginal symptoms.   3. HTN: Blood pressure is well controlled today but readings at home have been low and this is similar to issues he has had in the past with more aggressive BP control. Will stop amlodipine. Continue other therapy for now. I will follow up in 4 weeks.   4. HLD: Continue on Lipitor 80 mg daily. Most recent LDL 84 but he hadn't been taking regularly. Reinforced need to take daily. Work on a [employer].   5. History of CVA on plavix.     Medication Adjustments/Labs and Tests Ordered: Current medicines are reviewed at length with the patient today.  Concerns regarding medicines are outlined above.  Medication changes, Labs  and Tests ordered today are listed in the Patient Instructions below. There are no Patient Instructions on file for this visit.   Signed, Jaelyne Deeg [place], MD  09/07/2016 12:26 PM    [facility] [address], [place], [place]  [address] Phone: [phone]; Fax: [phone]

## 2016-09-07 ENCOUNTER — Encounter: Payer: Self-pay | Admitting: Cardiology

## 2016-09-07 ENCOUNTER — Ambulatory Visit (INDEPENDENT_AMBULATORY_CARE_PROVIDER_SITE_OTHER): Payer: PPO | Admitting: Cardiology

## 2016-09-07 VITALS — BP 111/65 | HR 62 | Ht 67.0 in | Wt 225.6 lb

## 2016-09-07 DIAGNOSIS — R079 Chest pain, unspecified: Secondary | ICD-10-CM

## 2016-09-07 DIAGNOSIS — I25118 Atherosclerotic heart disease of native coronary artery with other forms of angina pectoris: Secondary | ICD-10-CM | POA: Diagnosis not present

## 2016-09-07 DIAGNOSIS — E785 Hyperlipidemia, unspecified: Secondary | ICD-10-CM

## 2016-09-07 DIAGNOSIS — I10 Essential (primary) hypertension: Secondary | ICD-10-CM

## 2016-09-07 NOTE — Patient Instructions (Signed)
Stop taking amlodipine  Continue your other therapy  I will see you in 4 weeks

## 2016-09-09 ENCOUNTER — Other Ambulatory Visit: Payer: Self-pay | Admitting: Family Medicine

## 2016-09-09 DIAGNOSIS — M5134 Other intervertebral disc degeneration, thoracic region: Secondary | ICD-10-CM

## 2016-09-27 DIAGNOSIS — M542 Cervicalgia: Secondary | ICD-10-CM | POA: Diagnosis not present

## 2016-09-27 DIAGNOSIS — M19011 Primary osteoarthritis, right shoulder: Secondary | ICD-10-CM | POA: Diagnosis not present

## 2016-10-04 DIAGNOSIS — M542 Cervicalgia: Secondary | ICD-10-CM | POA: Diagnosis not present

## 2016-10-06 NOTE — Progress Notes (Signed)
Cardiology Office Note    Date:  10/07/2016   ID:  Jake Dunn, Jake Dunn 1940/08/03, MRN [id number]  PCP:  Jake Stains, MD  Cardiologist:  Dr. Macklen Wilhoite Dunn  Chief Complaint  Patient presents with  . Coronary Artery Disease  . Hypertension    History of Present Illness:  Jake Dunn is a 76 y.o. male seen for post hospital follow up. He has a  PMH of nonobstructive CAD in the past (70% 3rd diag '14), GERD, HTN, HL, renal cell CA s/p left nephrectomy. Last cardiac catheterization in 2014 showed nonobstructive CAD with 70% lesion in third diagonal that was treated medically. He is fairly active and recently undergoing physical therapy after his right shoulder surgery 5 months ago. He presented with left sided chest pressure on 08/15/2016 while at Urology office. BP was quite high that day. He underwent cardiac catheterization on 08/16/2016, this revealed two-vessel CAD with moderate calcified stenosis in the mid LAD, second diagonal, ramus intermedius, ostial and proximal left circumflex and a severe stenosis of the second PLA branch of the RCA. Overall normal LVEDP and normal LVEF. Recommended  medical therapy and outpatient exercise stress Myoview. While the LAD and the diagonal only showed moderate progression compared to the 2010/2014 studies, the left circumflex and intermediate stenosis had progressed. Echocardiogram obtained on 08/17/2016 showed EF 32-99%, grade 2 diastolic dysfunction, severely dilated left atrium, PA peak pressure 30 mmHg. Lexiscan Myoview obtained on 08/23/2016 showed EF 51%, no perfusion defect at stress, overall low risk study.   When seen April 20th his Imdur was increased and amlodipine was added. Afterwards  patient states he could not function. He felt very fatigued and lightheaded. He felt drunk all the time.  In the past he has had problems with aggressive BP control with orthostatic hypotension and syncope. When seen one month ago his amlodipine was discontinued due  to low BP and symptoms. He states he also reduced his metoprolol to once a day. Since then he is feeling much better. Fatigue and lightheadedness have resolved. BP at home has been excellent. He does have a lot of neck pain radiating to his shoulders and into his chest. He had an MRI last week showing significant cervical disease. He is scheduled to see Dr. Ellene Dunn.   Past Medical History:  Diagnosis Date  . Adenomatous polyp   . Anxiety   . Arthritis    "knees, ankles, shoulders" (08/15/2016)  . CAD (coronary artery disease)    s/p cath in 2014 showing significant stenosis in the diagonal side branches and in the RV marginal branch. These vessels were small and diffusely diseased. He is managed medically. 08/16/16 Cath, no disease progression  . Chronic lower back pain   . Disc disease, degenerative, cervical   . GERD (gastroesophageal reflux disease)   . Gout   . Hyperlipidemia   . Hypertension   . Osteoarthritis   . Renal cell carcinoma    left kidney removal  . Stroke [facility])    MINI STROKE 15+ YRS AGO, no residual  . Stroke ([place])    "I've had 2 or 3"; denies residual on 08/15/2016  . Syncope and collapse     Past Surgical History:  Procedure Laterality Date  . ANTERIOR CERVICAL DECOMP/DISCECTOMY FUSION  02/2004; 04/2005   Jake Dunn 5/14/2012Marland Kitchen Jake Dunn 09/20/2010  . BACK SURGERY    . CARDIAC CATHETERIZATION  12/14/2008   ef 50-55%. SHOWED SIGNIFICANT STENOSIS AND TO DIAGONAL SIDE BRANCHES AND IN THE RIGHT VENTRICULAR MARGINAL  BRANCH. THESE VESSELS WERE SMALL AND DIFFUSELY DISEASED  . CARDIOVASCULAR STRESS TEST  12/10/2009   EF 61%. EKG negative. Mild peri ischemia. Managed medically.   . CARPAL TUNNEL RELEASE Right 11/2005   Jake Dunn 09/20/2010  . CARPAL TUNNEL RELEASE Left 06/2007   Jake Dunn 5/132012  . COLONOSCOPY  11/2004   Jake Dunn 09/20/2010  . COLONOSCOPY W/ BIOPSIES AND POLYPECTOMY  09/2002   Jake Dunn 09/20/2010  . INCISION AND DRAINAGE OF WOUND Right 08/2005   shoulder/notes 09/20/2010  .  JOINT REPLACEMENT    . LEFT HEART CATH AND CORONARY ANGIOGRAPHY N/A 08/16/2016   Procedure: Left Heart Cath and Coronary Angiography;  Surgeon: Jake Mocha, MD;  Location: [place] CV LAB;  Service: Cardiovascular;  Laterality: N/A;  . LEFT HEART CATHETERIZATION WITH CORONARY ANGIOGRAM N/A 08/09/2012   Procedure: LEFT HEART CATHETERIZATION WITH CORONARY ANGIOGRAM;  Surgeon: Jake Digioia M Martinique, MD;  Location: [facility] CATH LAB;  Service: Cardiovascular;  Laterality: N/A;  . LUMBAR FUSION    . NEPHRECTOMY Left early 2000s  . REPLACEMENT TOTAL KNEE Right 08/2008   Jake Dunn 09/07/2010  . SHOULDER ARTHROSCOPY WITH ROTATOR CUFF REPAIR Right 06/2005   Jake Dunn 09/20/2010  . TOTAL KNEE ARTHROPLASTY  06/08/2011   Procedure: TOTAL KNEE ARTHROPLASTY;  Surgeon: Jake Lights, MD;  Location: [place];  Service: Orthopedics;  Laterality: Left;  . TOTAL SHOULDER ARTHROPLASTY Right 04/06/2016   Procedure: RIGHT REVERSE TOTAL SHOULDER ARTHROPLASTY;  Surgeon: Jake Lights, MD;  Location: [place];  Service: Orthopedics;  Laterality: Right;  . US ECHOCARDIOGRAPHY  12/15/2008   EF 60-65%  . US ECHOCARDIOGRAPHY  09/28/2004   EF 55-60%    Current Medications: Outpatient Medications Prior to Visit  Medication Sig Dispense Refill  . allopurinol (ZYLOPRIM) 300 MG tablet Take 300 mg by mouth daily.    Marland Kitchen aspirin EC 81 MG EC tablet Take 1 tablet (81 mg total) by mouth daily.    Marland Kitchen atorvastatin (LIPITOR) 80 MG tablet Take 1 tablet (80 mg total) by mouth every evening. 30 tablet 6  . clopidogrel (PLAVIX) 75 MG tablet Take 75 mg by mouth daily.    Marland Kitchen EPINEPHrine 0.3 mg/0.3 mL IJ SOAJ injection USE AS DIRECTED IF LIFE THREATENING REACTION OCCURS  11  . hydrochlorothiazide (MICROZIDE) 12.5 MG capsule Take 1 capsule by mouth daily.  0  . HYDROcodone-acetaminophen (NORCO/VICODIN) 5-325 MG tablet Take 1 tablet by mouth daily as needed.    . isosorbide mononitrate (IMDUR) 60 MG 24 hr tablet Take 1 tablet (60 mg total) by mouth daily. 30  tablet 6  . LORazepam (ATIVAN) 0.5 MG tablet Take 0.5 mg by mouth 2 (two) times daily as needed for anxiety.   2  . nitroGLYCERIN (NITROSTAT) 0.4 MG SL tablet Place 1 tablet (0.4 mg total) under the tongue every 5 (five) minutes x 3 doses as needed for chest pain. 25 tablet 2  . pantoprazole (PROTONIX) 40 MG tablet Take 40 mg by mouth daily.    . valsartan (DIOVAN) 320 MG tablet Take 320 mg by mouth daily.  1  . metoprolol (LOPRESSOR) 50 MG tablet Take 1 tablet (50 mg total) by mouth 2 (two) times daily. (Patient taking differently: Take 50 mg by mouth daily. ) 60 tablet 6   No facility-administered medications prior to visit.      Allergies:   Adhesive [tape] and Latex   Social History   Social History  . Marital status: Married    Spouse name: N/A  . Number of children: N/A  .  Years of education: N/A   Social History Main Topics  . Smoking status: Former Smoker    Quit date: 05/31/1989  . Smokeless tobacco: Never Used     Comment: 08/15/2016 "hadn't smoked a carton of cigarettes all my life"  . Alcohol use 0.0 oz/week     Comment: 08/15/2016 "did have 6-8 beers per day, has not had any since end of October 2017  . Drug use: No  . Sexual activity: Not Currently   Other Topics Concern  . None   Social History Narrative  . None     Family History:  The patient's family history includes Heart attack in his father; Heart disease in his brother.   ROS:   Please see the history of present illness.    ROS All other systems reviewed and are negative.   PHYSICAL EXAM:   VS:  BP 124/62   Pulse 72   Ht 5\' 7"  (1.702 m)   Wt 226 lb 3.2 oz (102.6 kg)   SpO2 92%   BMI 35.43 kg/m    GEN: Well nourished, well developed, in no acute distress  HEENT: normal  Neck: no JVD, carotid bruits, or masses Cardiac: RRR; no murmurs, rubs, or gallops,no edema  Respiratory:  clear to auscultation bilaterally, normal work of breathing GI: soft, nontender, nondistended, + BS MS: no deformity or  atrophy  Skin: warm and dry, no rash Neuro:  Alert and Oriented x 3, Strength and sensation are intact Psych: euthymic mood, full affect  Wt Readings from Last 3 Encounters:  10/07/16 226 lb 3.2 oz (102.6 kg)  09/07/16 225 lb 9.6 oz (102.3 kg)  08/26/16 225 lb (102.1 kg)      Studies/Labs Reviewed:   EKG:  EKG is not ordered today.   Recent Labs: 03/25/2016: ALT 15 08/15/2016: B Natriuretic Peptide 29.4 08/16/2016: BUN 18; Creatinine, Ser 1.23; Hemoglobin 13.5; Platelets 232; Potassium 4.0; Sodium 139   Lipid Panel    Component Value Date/Time   CHOL 148 08/16/2016 0504   TRIG 138 08/16/2016 0504   HDL 36 (L) 08/16/2016 0504   CHOLHDL 4.1 08/16/2016 0504   VLDL 28 08/16/2016 0504   LDLCALC 84 08/16/2016 0504    Additional studies/ records that were reviewed today include:   Cath 08/16/2016 Conclusion   1. 3 vessel CAD with moderate calcified stenoses of the mid-LAD/second diagonal, ramus intermedius, ostial and proximal circumflex, and severe stenosis of the second PLA branch of the RCA 2. Normal LV systolic function and normal LVEDP  Recommend: Aggressive medical therapy. Outpatient exercise stress myoview. Consider TCTS evaluation for CABG if Myoview high-risk. Otherwise continue with medical therapy.  While the LAD/diag only show modest progression compared to 2010 and 2014 cath studies, the left circumflex/intermediate stenoses have progressed.    Diagnostic Diagram         Echo 08/17/2016 LV EF: 55% -   60%  - Left ventricle: The cavity size was normal. There was mild   concentric hypertrophy. Systolic function was normal. The   estimated ejection fraction was in the range of 55% to 60%. Wall   motion was normal; there were no regional wall motion   abnormalities. Features are consistent with a pseudonormal left   ventricular filling pattern, with concomitant abnormal relaxation   and increased filling pressure (grade 2 diastolic dysfunction).    Doppler parameters are consistent with indeterminate ventricular   filling pressure. - Aortic valve: Transvalvular velocity was within the normal range.   There was  no stenosis. There was trivial regurgitation. - Mitral valve: Transvalvular velocity was within the normal range.   There was no evidence for stenosis. There was trivial   regurgitation. - Left atrium: The atrium was severely dilated. - Right ventricle: The cavity size was normal. Wall thickness was   normal. Systolic function was normal. - Atrial septum: No defect or patent foramen ovale was identified. - Tricuspid valve: There was trivial regurgitation. - Pulmonary arteries: Systolic pressure was within the normal   range. PA peak pressure: 30 mm Hg (S).    Myoview 08/23/2016 Study Highlights     Nuclear stress EF: 51%. Mildly a synchronous contraction.  There was no ST segment deviation noted during stress. No perfusion defects at stress.  This is a low risk study. No evidence of ischemia identified.      ASSESSMENT:    1. Chest pain, unspecified type   2. Coronary artery disease involving native coronary artery of native heart with other form of angina pectoris ([place])   3. Essential hypertension   4. Hyperlipidemia, unspecified hyperlipidemia type      PLAN:  In order of problems listed above:  1. Chest pain: He has atypical chest pain that is more musculoskeletal. This may be related to his cervical spine disease.  No real anginal symptoms. His Myoview was low risk. I personally reviewed his cardiac angiogram and he does have moderate 3 vessel CAD but no critical disease.   2. CAD: Three-vessel disease on recent cardiac catheterization- moderate. Myoview after cardiac catheterization came back low risk without ischemia. No real anginal symptoms.   3. HTN: Blood pressure is now well controlled and side effects resolved with reduction in medication. I will follow up in 3 months  4. HLD: Continue on  Lipitor 80 mg daily. Most recent LDL 84 but he hadn't been taking regularly. Reinforced need to take daily. Work on a [employer].   5. History of CVA on plavix.   6. Cervical spine disease. Await Neurosurgery evaluation. If surgery is needed I think he would be an acceptable candidate from a cardiac standpoint but would need to stop Plavix one week prior.    Medication Adjustments/Labs and Tests Ordered: Current medicines are reviewed at length with the patient today.  Concerns regarding medicines are outlined above.  Medication changes, Labs and Tests ordered today are listed in the Patient Instructions below. Patient Instructions  Continue your current therapy  I will see you in 3 months      Signed, Jeraldine Primeau Martinique, MD  10/07/2016 12:00 PM    [address], [place], [place]  [address] Phone: [phone]; Fax: [phone]

## 2016-10-07 ENCOUNTER — Encounter: Payer: Self-pay | Admitting: Cardiology

## 2016-10-07 ENCOUNTER — Ambulatory Visit (INDEPENDENT_AMBULATORY_CARE_PROVIDER_SITE_OTHER): Payer: PPO | Admitting: Cardiology

## 2016-10-07 VITALS — BP 124/62 | HR 72 | Ht 67.0 in | Wt 226.2 lb

## 2016-10-07 DIAGNOSIS — I25118 Atherosclerotic heart disease of native coronary artery with other forms of angina pectoris: Secondary | ICD-10-CM

## 2016-10-07 DIAGNOSIS — E785 Hyperlipidemia, unspecified: Secondary | ICD-10-CM | POA: Diagnosis not present

## 2016-10-07 DIAGNOSIS — R079 Chest pain, unspecified: Secondary | ICD-10-CM | POA: Diagnosis not present

## 2016-10-07 DIAGNOSIS — I10 Essential (primary) hypertension: Secondary | ICD-10-CM | POA: Diagnosis not present

## 2016-10-07 MED ORDER — METOPROLOL TARTRATE 50 MG PO TABS: 50.0000 mg | ORAL_TABLET | Freq: Every day | ORAL | Status: DC

## 2016-10-07 NOTE — Patient Instructions (Signed)
Continue your current therapy   I will see you in 3 months. 

## 2016-10-18 ENCOUNTER — Ambulatory Visit (HOSPITAL_COMMUNITY)
Admission: RE | Admit: 2016-10-18 | Discharge: 2016-10-19 | Disposition: A | Discharge status: Home / Self Care | Payer: PPO | Source: Ambulatory Visit | Attending: Orthopedic Surgery | Admitting: Orthopedic Surgery

## 2016-10-18 ENCOUNTER — Encounter (HOSPITAL_BASED_OUTPATIENT_CLINIC_OR_DEPARTMENT_OTHER): Payer: Self-pay | Admitting: *Deleted

## 2016-10-18 ENCOUNTER — Ambulatory Visit (HOSPITAL_BASED_OUTPATIENT_CLINIC_OR_DEPARTMENT_OTHER): Payer: PPO | Admitting: Certified Registered"

## 2016-10-18 ENCOUNTER — Encounter (HOSPITAL_BASED_OUTPATIENT_CLINIC_OR_DEPARTMENT_OTHER)
Admission: RE | Disposition: A | Discharge status: Home / Self Care | Payer: Self-pay | Source: Ambulatory Visit | Attending: Orthopedic Surgery

## 2016-10-18 DIAGNOSIS — M170 Bilateral primary osteoarthritis of knee: Secondary | ICD-10-CM | POA: Diagnosis not present

## 2016-10-18 DIAGNOSIS — Z86010 Personal history of colonic polyps: Secondary | ICD-10-CM | POA: Insufficient documentation

## 2016-10-18 DIAGNOSIS — M5030 Other cervical disc degeneration, unspecified cervical region: Secondary | ICD-10-CM | POA: Diagnosis not present

## 2016-10-18 DIAGNOSIS — M19071 Primary osteoarthritis, right ankle and foot: Secondary | ICD-10-CM | POA: Insufficient documentation

## 2016-10-18 DIAGNOSIS — M545 Low back pain: Secondary | ICD-10-CM | POA: Insufficient documentation

## 2016-10-18 DIAGNOSIS — M19072 Primary osteoarthritis, left ankle and foot: Secondary | ICD-10-CM | POA: Diagnosis not present

## 2016-10-18 DIAGNOSIS — M19012 Primary osteoarthritis, left shoulder: Secondary | ICD-10-CM | POA: Insufficient documentation

## 2016-10-18 DIAGNOSIS — M109 Gout, unspecified: Secondary | ICD-10-CM | POA: Diagnosis not present

## 2016-10-18 DIAGNOSIS — Z87891 Personal history of nicotine dependence: Secondary | ICD-10-CM | POA: Diagnosis not present

## 2016-10-18 DIAGNOSIS — T814XXA Infection following a procedure, initial encounter: Secondary | ICD-10-CM | POA: Diagnosis not present

## 2016-10-18 DIAGNOSIS — L02413 Cutaneous abscess of right upper limb: Secondary | ICD-10-CM | POA: Diagnosis not present

## 2016-10-18 DIAGNOSIS — K219 Gastro-esophageal reflux disease without esophagitis: Secondary | ICD-10-CM | POA: Insufficient documentation

## 2016-10-18 DIAGNOSIS — Z96653 Presence of artificial knee joint, bilateral: Secondary | ICD-10-CM | POA: Insufficient documentation

## 2016-10-18 DIAGNOSIS — Z85528 Personal history of other malignant neoplasm of kidney: Secondary | ICD-10-CM | POA: Insufficient documentation

## 2016-10-18 DIAGNOSIS — F419 Anxiety disorder, unspecified: Secondary | ICD-10-CM | POA: Insufficient documentation

## 2016-10-18 DIAGNOSIS — E785 Hyperlipidemia, unspecified: Secondary | ICD-10-CM | POA: Diagnosis not present

## 2016-10-18 DIAGNOSIS — Z8249 Family history of ischemic heart disease and other diseases of the circulatory system: Secondary | ICD-10-CM | POA: Insufficient documentation

## 2016-10-18 DIAGNOSIS — I10 Essential (primary) hypertension: Secondary | ICD-10-CM | POA: Diagnosis not present

## 2016-10-18 DIAGNOSIS — B999 Unspecified infectious disease: Secondary | ICD-10-CM

## 2016-10-18 DIAGNOSIS — Z8673 Personal history of transient ischemic attack (TIA), and cerebral infarction without residual deficits: Secondary | ICD-10-CM | POA: Diagnosis not present

## 2016-10-18 DIAGNOSIS — M19011 Primary osteoarthritis, right shoulder: Secondary | ICD-10-CM | POA: Diagnosis not present

## 2016-10-18 DIAGNOSIS — G8929 Other chronic pain: Secondary | ICD-10-CM | POA: Insufficient documentation

## 2016-10-18 DIAGNOSIS — Z91048 Other nonmedicinal substance allergy status: Secondary | ICD-10-CM | POA: Insufficient documentation

## 2016-10-18 DIAGNOSIS — Z7982 Long term (current) use of aspirin: Secondary | ICD-10-CM | POA: Insufficient documentation

## 2016-10-18 DIAGNOSIS — I25110 Atherosclerotic heart disease of native coronary artery with unstable angina pectoris: Secondary | ICD-10-CM | POA: Diagnosis not present

## 2016-10-18 DIAGNOSIS — Z905 Acquired absence of kidney: Secondary | ICD-10-CM | POA: Diagnosis not present

## 2016-10-18 DIAGNOSIS — Z79899 Other long term (current) drug therapy: Secondary | ICD-10-CM | POA: Insufficient documentation

## 2016-10-18 DIAGNOSIS — Z981 Arthrodesis status: Secondary | ICD-10-CM | POA: Diagnosis not present

## 2016-10-18 DIAGNOSIS — Z91040 Latex allergy status: Secondary | ICD-10-CM | POA: Diagnosis not present

## 2016-10-18 DIAGNOSIS — I2510 Atherosclerotic heart disease of native coronary artery without angina pectoris: Secondary | ICD-10-CM | POA: Insufficient documentation

## 2016-10-18 HISTORY — PX: INCISION AND DRAINAGE ABSCESS: SHX5864

## 2016-10-18 SURGERY — INCISION AND DRAINAGE, ABSCESS
Anesthesia: General | Site: Shoulder | Laterality: Right

## 2016-10-18 MED ORDER — PANTOPRAZOLE SODIUM 40 MG PO TBEC
40.0000 mg | DELAYED_RELEASE_TABLET | Freq: Every day | ORAL | Status: DC
  Administered 2016-10-18: 40 mg via ORAL

## 2016-10-18 MED ORDER — ACETAMINOPHEN 325 MG PO TABS: 650.0000 mg | ORAL_TABLET | Freq: Four times a day (QID) | ORAL | Status: DC | PRN

## 2016-10-18 MED ORDER — METOCLOPRAMIDE HCL 5 MG PO TABS: 5.0000 mg | ORAL_TABLET | Freq: Three times a day (TID) | ORAL | Status: DC | PRN

## 2016-10-18 MED ORDER — HYDROMORPHONE HCL 1 MG/ML IJ SOLN
INTRAMUSCULAR | Status: AC
  Filled 2016-10-18: qty 0.5

## 2016-10-18 MED ORDER — ACETAMINOPHEN 500 MG PO TABS
1000.0000 mg | ORAL_TABLET | Freq: Four times a day (QID) | ORAL | Status: DC
  Administered 2016-10-19: 1000 mg via ORAL
  Filled 2016-10-18: qty 2

## 2016-10-18 MED ORDER — FENTANYL CITRATE (PF) 100 MCG/2ML IJ SOLN
50.0000 ug | INTRAMUSCULAR | Status: DC | PRN
  Administered 2016-10-18: 100 ug via INTRAVENOUS

## 2016-10-18 MED ORDER — PROPOFOL 10 MG/ML IV BOLUS
INTRAVENOUS | Status: DC | PRN
  Administered 2016-10-18: 150 mg via INTRAVENOUS

## 2016-10-18 MED ORDER — METOPROLOL TARTRATE 50 MG PO TABS
50.0000 mg | ORAL_TABLET | Freq: Every day | ORAL | Status: DC
  Administered 2016-10-18: 50 mg via ORAL

## 2016-10-18 MED ORDER — KETOROLAC TROMETHAMINE 15 MG/ML IJ SOLN
7.5000 mg | Freq: Four times a day (QID) | INTRAMUSCULAR | Status: DC | PRN
  Administered 2016-10-18: 7.5 mg via INTRAVENOUS

## 2016-10-18 MED ORDER — HYDROMORPHONE HCL 1 MG/ML IJ SOLN
0.2500 mg | INTRAMUSCULAR | Status: DC | PRN
Start: 2016-10-18 — End: 2016-10-18
  Administered 2016-10-18 (×2): 0.5 mg via INTRAVENOUS

## 2016-10-18 MED ORDER — ONDANSETRON HCL 4 MG/2ML IJ SOLN: 4.0000 mg | Freq: Four times a day (QID) | INTRAMUSCULAR | Status: DC | PRN

## 2016-10-18 MED ORDER — PHENYLEPHRINE HCL 10 MG/ML IJ SOLN
INTRAVENOUS | Status: DC | PRN
  Administered 2016-10-18: 20 ug/min via INTRAVENOUS

## 2016-10-18 MED ORDER — LACTATED RINGERS IV SOLN
INTRAVENOUS | Status: DC
  Administered 2016-10-18: 12:00:00 via INTRAVENOUS

## 2016-10-18 MED ORDER — OXYCODONE HCL 5 MG/5ML PO SOLN
5.0000 mg | Freq: Once | ORAL | Status: DC | PRN
Start: 2016-10-18 — End: 2016-10-18

## 2016-10-18 MED ORDER — DOCUSATE SODIUM 100 MG PO CAPS
100.0000 mg | ORAL_CAPSULE | Freq: Two times a day (BID) | ORAL | Status: DC
  Administered 2016-10-18: 100 mg via ORAL
  Filled 2016-10-18: qty 1

## 2016-10-18 MED ORDER — MEPERIDINE HCL 25 MG/ML IJ SOLN: 6.2500 mg | INTRAMUSCULAR | Status: DC | PRN

## 2016-10-18 MED ORDER — PROPOFOL 10 MG/ML IV BOLUS
INTRAVENOUS | Status: AC
  Filled 2016-10-18: qty 20

## 2016-10-18 MED ORDER — POLYETHYLENE GLYCOL 3350 17 G PO PACK: 17.0000 g | PACK | Freq: Every day | ORAL | Status: DC | PRN

## 2016-10-18 MED ORDER — HYDROCHLOROTHIAZIDE 12.5 MG PO CAPS
12.5000 mg | ORAL_CAPSULE | Freq: Every day | ORAL | Status: DC
  Administered 2016-10-18: 12.5 mg via ORAL

## 2016-10-18 MED ORDER — MIDAZOLAM HCL 2 MG/2ML IJ SOLN
1.0000 mg | INTRAMUSCULAR | Status: DC | PRN
  Administered 2016-10-18: 1 mg via INTRAVENOUS

## 2016-10-18 MED ORDER — LACTATED RINGERS IV SOLN
INTRAVENOUS | Status: DC
  Administered 2016-10-18 (×3): via INTRAVENOUS

## 2016-10-18 MED ORDER — SUGAMMADEX SODIUM 500 MG/5ML IV SOLN
INTRAVENOUS | Status: AC
  Filled 2016-10-18: qty 5

## 2016-10-18 MED ORDER — HYDROMORPHONE HCL 1 MG/ML IJ SOLN: 0.5000 mg | INTRAMUSCULAR | Status: DC | PRN

## 2016-10-18 MED ORDER — FENTANYL CITRATE (PF) 100 MCG/2ML IJ SOLN
INTRAMUSCULAR | Status: AC
  Filled 2016-10-18: qty 2

## 2016-10-18 MED ORDER — SUGAMMADEX SODIUM 200 MG/2ML IV SOLN
INTRAVENOUS | Status: DC | PRN
  Administered 2016-10-18: 215 mg via INTRAVENOUS

## 2016-10-18 MED ORDER — ENOXAPARIN SODIUM 40 MG/0.4ML ~~LOC~~ SOLN: 40.0000 mg | SUBCUTANEOUS | Status: DC

## 2016-10-18 MED ORDER — KETOROLAC TROMETHAMINE 30 MG/ML IJ SOLN
INTRAMUSCULAR | Status: AC
  Filled 2016-10-18: qty 1

## 2016-10-18 MED ORDER — ATORVASTATIN CALCIUM 80 MG PO TABS: 80.0000 mg | ORAL_TABLET | Freq: Every evening | ORAL | Status: DC

## 2016-10-18 MED ORDER — CEFAZOLIN SODIUM-DEXTROSE 2-3 GM-% IV SOLR
INTRAVENOUS | Status: DC | PRN
  Administered 2016-10-18: 2 g via INTRAVENOUS

## 2016-10-18 MED ORDER — DEXTROSE 5 % IV SOLN
2.0000 g | INTRAVENOUS | Status: DC
  Administered 2016-10-18: 2 g via INTRAVENOUS
  Filled 2016-10-18: qty 2

## 2016-10-18 MED ORDER — CLOPIDOGREL BISULFATE 75 MG PO TABS: 75.0000 mg | ORAL_TABLET | Freq: Every day | ORAL | Status: DC

## 2016-10-18 MED ORDER — CHLORHEXIDINE GLUCONATE 4 % EX LIQD: 60.0000 mL | Freq: Once | CUTANEOUS | Status: DC

## 2016-10-18 MED ORDER — ROCURONIUM BROMIDE 100 MG/10ML IV SOLN
INTRAVENOUS | Status: DC | PRN
  Administered 2016-10-18: 40 mg via INTRAVENOUS

## 2016-10-18 MED ORDER — CEFAZOLIN SODIUM-DEXTROSE 2-4 GM/100ML-% IV SOLN
INTRAVENOUS | Status: AC
  Filled 2016-10-18: qty 100

## 2016-10-18 MED ORDER — ISOSORBIDE MONONITRATE ER 60 MG PO TB24
60.0000 mg | ORAL_TABLET | Freq: Every day | ORAL | Status: DC
  Administered 2016-10-18: 60 mg via ORAL

## 2016-10-18 MED ORDER — VANCOMYCIN HCL 10 G IV SOLR
1250.0000 mg | INTRAVENOUS | Status: AC
  Administered 2016-10-18: 1250 mg via INTRAVENOUS
  Filled 2016-10-18: qty 1250

## 2016-10-18 MED ORDER — ALLOPURINOL 300 MG PO TABS: 300.0000 mg | ORAL_TABLET | Freq: Every day | ORAL | Status: DC

## 2016-10-18 MED ORDER — MIDAZOLAM HCL 2 MG/2ML IJ SOLN
INTRAMUSCULAR | Status: AC
  Filled 2016-10-18: qty 2

## 2016-10-18 MED ORDER — IRBESARTAN 300 MG PO TABS
300.0000 mg | ORAL_TABLET | Freq: Every day | ORAL | Status: DC
  Administered 2016-10-18: 300 mg via ORAL

## 2016-10-18 MED ORDER — ASPIRIN 81 MG PO TBEC: 81.0000 mg | DELAYED_RELEASE_TABLET | Freq: Every day | ORAL | Status: DC

## 2016-10-18 MED ORDER — LACTATED RINGERS IV SOLN
INTRAVENOUS | Status: DC
Start: 2016-10-18 — End: 2016-10-19
  Administered 2016-10-18: 16:00:00 via INTRAVENOUS

## 2016-10-18 MED ORDER — PROMETHAZINE HCL 25 MG/ML IJ SOLN
6.2500 mg | INTRAMUSCULAR | Status: DC | PRN
Start: 2016-10-18 — End: 2016-10-18

## 2016-10-18 MED ORDER — METOCLOPRAMIDE HCL 5 MG/ML IJ SOLN: 5.0000 mg | Freq: Three times a day (TID) | INTRAMUSCULAR | Status: DC | PRN

## 2016-10-18 MED ORDER — OXYCODONE HCL 5 MG PO TABS: 5.0000 mg | ORAL_TABLET | Freq: Once | ORAL | Status: DC | PRN

## 2016-10-18 MED ORDER — TOBRAMYCIN SULFATE 80 MG/2ML IJ SOLN
INTRAMUSCULAR | Status: DC | PRN
  Administered 2016-10-18: 160 mg

## 2016-10-18 MED ORDER — BUPIVACAINE HCL (PF) 0.25 % IJ SOLN
INTRAMUSCULAR | Status: DC | PRN
  Administered 2016-10-18: 30 mL

## 2016-10-18 MED ORDER — ONDANSETRON HCL 4 MG PO TABS: 4.0000 mg | ORAL_TABLET | Freq: Four times a day (QID) | ORAL | Status: DC | PRN

## 2016-10-18 MED ORDER — DIPHENHYDRAMINE HCL 12.5 MG/5ML PO ELIX: 12.5000 mg | ORAL_SOLUTION | ORAL | Status: DC | PRN

## 2016-10-18 MED ORDER — ONDANSETRON HCL 4 MG/2ML IJ SOLN
4.0000 mg | Freq: Once | INTRAMUSCULAR | Status: AC
  Administered 2016-10-18: 4 mg via INTRAVENOUS

## 2016-10-18 MED ORDER — TOBRAMYCIN SULFATE 1.2 G IJ SOLR
1.2000 g | INTRAMUSCULAR | Status: DC
  Filled 2016-10-18: qty 1.2

## 2016-10-18 MED ORDER — LORAZEPAM 0.5 MG PO TABS: 0.5000 mg | ORAL_TABLET | Freq: Two times a day (BID) | ORAL | Status: DC | PRN

## 2016-10-18 MED ORDER — SENNA 8.6 MG PO TABS
1.0000 | ORAL_TABLET | Freq: Two times a day (BID) | ORAL | Status: DC
  Administered 2016-10-18: 8.6 mg via ORAL
  Filled 2016-10-18: qty 1

## 2016-10-18 MED ORDER — ACETAMINOPHEN 650 MG RE SUPP: 650.0000 mg | Freq: Four times a day (QID) | RECTAL | Status: DC | PRN

## 2016-10-18 MED ORDER — OXYCODONE HCL 5 MG PO TABS
5.0000 mg | ORAL_TABLET | ORAL | Status: DC | PRN
  Administered 2016-10-19: 10 mg via ORAL
  Filled 2016-10-18: qty 2

## 2016-10-18 SURGICAL SUPPLY — 72 items
APL SKNCLS STERI-STRIP NONHPOA (GAUZE/BANDAGES/DRESSINGS)
BAG DECANTER FOR FLEXI CONT (MISCELLANEOUS) ×2 IMPLANT
BENZOIN TINCTURE PRP APPL 2/3 (GAUZE/BANDAGES/DRESSINGS) IMPLANT
BLADE SAW SAG 29X58X.64 (BLADE) IMPLANT
BLADE SURG 15 STRL LF DISP TIS (BLADE) ×1 IMPLANT
BLADE SURG 15 STRL SS (BLADE) ×4
BNDG GAUZE ELAST 4 BULKY (GAUZE/BANDAGES/DRESSINGS) ×1 IMPLANT
BOWL SMART MIX CTS (DISPOSABLE) IMPLANT
CHLORAPREP W/TINT 26ML (MISCELLANEOUS) ×2 IMPLANT
CLEANER CAUTERY TIP 5X5 PAD (MISCELLANEOUS) ×1 IMPLANT
COVER BACK TABLE 60X90IN (DRAPES) ×2 IMPLANT
COVER MAYO STAND STRL (DRAPES) ×2 IMPLANT
DECANTER SPIKE VIAL GLASS SM (MISCELLANEOUS) ×1 IMPLANT
DRAPE SHOULDER BEACH CHAIR (DRAPES) ×1 IMPLANT
DRAPE STERI 35X30 U-POUCH (DRAPES) ×1 IMPLANT
DRAPE U-SHAPE 47X51 STRL (DRAPES) ×3 IMPLANT
DRAPE U-SHAPE 76X120 STRL (DRAPES) ×4 IMPLANT
DRSG ADAPTIC 3X8 NADH LF (GAUZE/BANDAGES/DRESSINGS) ×1 IMPLANT
DRSG MEPILEX BORDER 4X8 (GAUZE/BANDAGES/DRESSINGS) IMPLANT
DRSG PAD ABDOMINAL 8X10 ST (GAUZE/BANDAGES/DRESSINGS) ×2 IMPLANT
ELECT REM PT RETURN 9FT ADLT (ELECTROSURGICAL) ×2
ELECTRODE REM PT RTRN 9FT ADLT (ELECTROSURGICAL) ×1 IMPLANT
FACESHIELD WRAPAROUND (MASK) IMPLANT
GAUZE IODOFORM PACK 1/2 7832 (GAUZE/BANDAGES/DRESSINGS) IMPLANT
GAUZE SPONGE 4X4 12PLY STRL (GAUZE/BANDAGES/DRESSINGS) ×1 IMPLANT
GAUZE SPONGE 4X4 12PLY STRL LF (GAUZE/BANDAGES/DRESSINGS) IMPLANT
GAUZE XEROFORM 1X8 LF (GAUZE/BANDAGES/DRESSINGS) IMPLANT
GLOVE BIOGEL PI IND STRL 8 (GLOVE) ×2 IMPLANT
GLOVE BIOGEL PI INDICATOR 8 (GLOVE) ×2
GLOVE SURG SS PI 7.5 STRL IVOR (GLOVE) ×4 IMPLANT
GOWN STRL REUS W/ TWL LRG LVL3 (GOWN DISPOSABLE) ×1 IMPLANT
GOWN STRL REUS W/ TWL XL LVL3 (GOWN DISPOSABLE) ×2 IMPLANT
GOWN STRL REUS W/TWL LRG LVL3 (GOWN DISPOSABLE) ×2
GOWN STRL REUS W/TWL XL LVL3 (GOWN DISPOSABLE) ×4
IV NS IRRIG 3000ML ARTHROMATIC (IV SOLUTION) ×2 IMPLANT
IV SET EXT 30 76VOL 4 MALE LL (IV SETS) ×1 IMPLANT
MANIFOLD NEPTUNE II (INSTRUMENTS) ×2 IMPLANT
NDL 1/2 CIR CATGUT .05X1.09 (NEEDLE) IMPLANT
NEEDLE 1/2 CIR CATGUT .05X1.09 (NEEDLE) IMPLANT
NS IRRIG 1000ML POUR BTL (IV SOLUTION) IMPLANT
PACK ARTHROSCOPY DSU (CUSTOM PROCEDURE TRAY) ×2 IMPLANT
PACK BASIN DAY SURGERY FS (CUSTOM PROCEDURE TRAY) ×2 IMPLANT
PAD CLEANER CAUTERY TIP 5X5 (MISCELLANEOUS)
PENCIL BUTTON HOLSTER BLD 10FT (ELECTRODE) ×2 IMPLANT
SET IRRIG Y TYPE TUR BLADDER L (SET/KITS/TRAYS/PACK) ×4 IMPLANT
SLEEVE SCD COMPRESS KNEE MED (MISCELLANEOUS) ×2 IMPLANT
SLING ARM IMMOBILIZER LRG (SOFTGOODS) IMPLANT
SLING ARM IMMOBILIZER MED (SOFTGOODS) IMPLANT
SPONGE LAP 18X18 X RAY DECT (DISPOSABLE) ×4 IMPLANT
STRIP CLOSURE SKIN 1/2X4 (GAUZE/BANDAGES/DRESSINGS) IMPLANT
SUCTION FRAZIER HANDLE 10FR (MISCELLANEOUS) ×1
SUCTION TUBE FRAZIER 10FR DISP (MISCELLANEOUS) ×1 IMPLANT
SUT ETHILON 3 0 FSL (SUTURE) ×2 IMPLANT
SUT FIBERWIRE #2 38 T-5 BLUE (SUTURE)
SUT MNCRL AB 4-0 PS2 18 (SUTURE) IMPLANT
SUT MON AB 2-0 CT1 36 (SUTURE) ×1 IMPLANT
SUT VIC AB 0 CT1 27 (SUTURE)
SUT VIC AB 0 CT1 27XBRD ANBCTR (SUTURE) IMPLANT
SUT VIC AB 2-0 CT1 27 (SUTURE)
SUT VIC AB 2-0 CT1 TAPERPNT 27 (SUTURE) IMPLANT
SUT VIC AB 3-0 SH 27 (SUTURE)
SUT VIC AB 3-0 SH 27X BRD (SUTURE) IMPLANT
SUTURE FIBERWR #2 38 T-5 BLUE (SUTURE) IMPLANT
SWAB COLLECTION DEVICE MRSA (MISCELLANEOUS) IMPLANT
SWAB CULTURE ESWAB REG 1ML (MISCELLANEOUS) IMPLANT
SYR 50ML LL SCALE MARK (SYRINGE) ×2 IMPLANT
SYR BULB IRRIGATION 50ML (SYRINGE) ×2 IMPLANT
TAPE PAPER 3X10 WHT MICROPORE (GAUZE/BANDAGES/DRESSINGS) ×1 IMPLANT
TOWEL OR 17X24 6PK STRL BLUE (TOWEL DISPOSABLE) ×3 IMPLANT
TOWEL OR NON WOVEN STRL DISP B (DISPOSABLE) ×2 IMPLANT
TUBE CONNECTING 20X1/4 (TUBING) ×4 IMPLANT
YANKAUER SUCT BULB TIP NO VENT (SUCTIONS) ×6 IMPLANT

## 2016-10-18 NOTE — Interval H&P Note (Signed)
History and Physical Interval Note:  10/18/2016 11:34 AM  Jake Dunn  has presented today for surgery, with the diagnosis of Abscess Right Shoulder   The various methods of treatment have been discussed with the patient and family. After consideration of risks, benefits and other options for treatment, the patient has consented to  Procedure(s): INCISION AND DRAINAGE ABSCESS (Right) as a surgical intervention .  The patient's history has been reviewed, patient examined, no change in status, stable for surgery.  I have reviewed the patient's chart and labs.  Questions were answered to the patient's satisfaction.     [name] D

## 2016-10-18 NOTE — Discharge Instructions (Signed)
Present to Radiology at 1145 tomorrow morning (10/19/16) for IV Antibiotic Line Placement (PICC Line).  Ria Comment, Dr. Frankey Poot PA will be arranging home health nursing and IV antibiotic prescriptions for you.   Diet: As you were doing prior to hospitalization   Dressing:  Leave dressing on and dry until follow up in the office.  Activity:  Increase activity slowly as tolerated, but follow the weight bearing instructions below.  The rules on driving is that you can not be taking narcotics while you drive, and you must feel in control of the vehicle.    Weight Bearing:  Limit strenuous use of right arm.  Don't lift arm above shoulder level.  Wear sling for your comfort.   To prevent constipation: you may use a stool softener such as -  Colace (over the counter) 100 mg by mouth twice a day  Drink plenty of fluids (prune juice may be helpful) and high fiber foods Miralax (over the counter) for constipation as needed.    Itching:  If you experience itching with your medications, try taking only a single pain pill, or even half a pain pill at a time.  You may take up to 10 pain pills per day, and you can also use benadryl over the counter for itching or also to help with sleep.   Precautions:  If you experience chest pain or shortness of breath - call 911 immediately for transfer to the hospital emergency department!!  If you develop a fever greater that 101 F, purulent drainage from wound, increased redness or drainage from wound, or calf pain -- Call the office at [phone]                                                Follow- Up Appointment:  Please call for an appointment to be seen in [place] - [phone]

## 2016-10-18 NOTE — Interval H&P Note (Signed)
History and Physical Interval Note:  10/18/2016 10:28 AM  Jake Dunn  has presented today for surgery, with the diagnosis of Abscess Right Shoulder   The various methods of treatment have been discussed with the patient and family. After consideration of risks, benefits and other options for treatment, the patient has consented to  Procedure(s): INCISION AND DRAINAGE ABSCESS (Right) as a surgical intervention .  The patient's history has been reviewed, patient examined, no change in status, stable for surgery.  I have reviewed the patient's chart and labs.  Questions were answered to the patient's satisfaction.     Jayveon Convey D

## 2016-10-18 NOTE — Transfer of Care (Signed)
Immediate Anesthesia Transfer of Care Note  Patient: Jake Dunn  Procedure(s) Performed: Procedure(s): INCISION AND DRAINAGE ABSCESS RIGHT SHOULDER (Right)  Patient Location: PACU  Anesthesia Type:General  Level of Consciousness: awake, alert  and oriented  Airway & Oxygen Therapy: Patient Spontanous Breathing and Patient connected to face mask oxygen  Post-op Assessment: Report given to RN and Post -op Vital signs reviewed and stable  Post vital signs: Reviewed and stable  Last Vitals:  Vitals:   10/18/16 1120 10/18/16 1415  BP: 123/69 117/62  Pulse: 68 78  Resp: 18 12  Temp: 36.7 C     Last Pain:  Vitals:   10/18/16 1120  TempSrc: Oral  PainSc: 9       Patients Stated Pain Goal: 3 (83/43/73 [id number])  Complications: No apparent anesthesia complications

## 2016-10-18 NOTE — Anesthesia Preprocedure Evaluation (Addendum)
Anesthesia Evaluation  Patient identified by MRN, date of birth, ID band Patient awake    Reviewed: Allergy & Precautions, NPO status , Patient's Chart, lab work & pertinent test results, reviewed documented beta blocker date and time   Airway Mallampati: II  TM Distance: >3 FB Neck ROM: Full    Dental no notable dental hx. (+) Dental Advisory Given   Pulmonary former smoker,    Pulmonary exam normal breath sounds clear to auscultation       Cardiovascular hypertension, Pt. on medications and Pt. on home beta blockers + CAD  Normal cardiovascular exam Rhythm:Regular Rate:Normal     Neuro/Psych Anxiety CVA    GI/Hepatic Neg liver ROS, GERD  ,  Endo/Other  negative endocrine ROS  Renal/GU Renal disease (s/p L nephrectomy for RCC)     Musculoskeletal  (+) Arthritis ,   Abdominal   Peds  Hematology negative hematology ROS (+)   Anesthesia Other Findings   Reproductive/Obstetrics                            Lab Results  Component Value Date   WBC 6.2 08/16/2016   HGB 13.5 08/16/2016   HCT 41.0 08/16/2016   MCV 90.3 08/16/2016   PLT 232 08/16/2016   Lab Results  Component Value Date   CREATININE 1.23 08/16/2016   BUN 18 08/16/2016   NA 139 08/16/2016   K 4.0 08/16/2016   CL 106 08/16/2016   CO2 22 08/16/2016    Anesthesia Physical  Anesthesia Plan  ASA: III  Anesthesia Plan: General   Post-op Pain Management:    Induction: Intravenous  PONV Risk Score and Plan: 2 and Ondansetron, Dexamethasone and Treatment may vary due to age or medical condition  Airway Management Planned: Oral ETT  Additional Equipment:   Intra-op Plan:   Post-operative Plan: Extubation in OR  Informed Consent: I have reviewed the patients History and Physical, chart, labs and discussed the procedure including the risks, benefits and alternatives for the proposed anesthesia with the patient or  authorized representative who has indicated his/her understanding and acceptance.   Dental advisory given  Plan Discussed with: CRNA  Anesthesia Plan Comments:        Anesthesia Quick Evaluation

## 2016-10-18 NOTE — H&P (Signed)
REQUESTING PHYSICIAN: [name], MD  Chief Complaint: right shoulder pain  HPI: Jake Dunn is a 76 y.o. male who complains of  Right shoulder pain over the past week.  No injury, no recent dental work or remote infection, but bite, etc.  He does have a history of back infection following lumbar surgery several years ago as well as remote right shoulder infection several years ago as well.  All of his pain is anterior aspect extending into the deltoid.  Painful ROM.  No numbness, tingling and burning.  Not currently on antibiotics.  S/p right shoulder revision November, 2017.  Doing well until recently.  Past Medical History:  Diagnosis Date  . Adenomatous polyp   . Anxiety   . Arthritis    "knees, ankles, shoulders" (08/15/2016)  . CAD (coronary artery disease)    s/p cath in 2014 showing significant stenosis in the diagonal side branches and in the RV marginal branch. These vessels were small and diffusely diseased. He is managed medically. 08/16/16 Cath, no disease progression  . Chronic lower back pain   . Disc disease, degenerative, cervical   . GERD (gastroesophageal reflux disease)   . Gout   . Hyperlipidemia   . Hypertension   . Osteoarthritis   . Renal cell carcinoma    left kidney removal  . Stroke [facility])    MINI STROKE 15+ YRS AGO, no residual  . Stroke ([place])    "I've had 2 or 3"; denies residual on 08/15/2016  . Syncope and collapse    Past Surgical History:  Procedure Laterality Date  . ANTERIOR CERVICAL DECOMP/DISCECTOMY FUSION  02/2004; 04/2005   [name] 5/14/2012Marland Kitchen [name] 09/20/2010  . BACK SURGERY    . CARDIAC CATHETERIZATION  12/14/2008   ef 50-55%. SHOWED SIGNIFICANT STENOSIS AND TO DIAGONAL SIDE BRANCHES AND IN THE RIGHT VENTRICULAR MARGINAL BRANCH. THESE VESSELS WERE SMALL AND DIFFUSELY DISEASED  . CARDIOVASCULAR STRESS TEST  12/10/2009   EF 61%. EKG negative. Mild peri ischemia. Managed medically.   . CARPAL TUNNEL RELEASE Right 11/2005   [name]  09/20/2010  . CARPAL TUNNEL RELEASE Left 06/2007   [name] 5/132012  . COLONOSCOPY  11/2004   [name] 09/20/2010  . COLONOSCOPY W/ BIOPSIES AND POLYPECTOMY  09/2002   [name] 09/20/2010  . INCISION AND DRAINAGE OF WOUND Right 08/2005   shoulder/notes 09/20/2010  . JOINT REPLACEMENT    . LEFT HEART CATH AND CORONARY ANGIOGRAPHY N/A 08/16/2016   Procedure: Left Heart Cath and Coronary Angiography;  Surgeon: [name], MD;  Location: [place] CV LAB;  Service: Cardiovascular;  Laterality: N/A;  . LEFT HEART CATHETERIZATION WITH CORONARY ANGIOGRAM N/A 08/09/2012   Procedure: LEFT HEART CATHETERIZATION WITH CORONARY ANGIOGRAM;  Surgeon: Peter M Martinique, MD;  Location: [facility] CATH LAB;  Service: Cardiovascular;  Laterality: N/A;  . LUMBAR FUSION    . NEPHRECTOMY Left early 2000s  . REPLACEMENT TOTAL KNEE Right 08/2008   [name] 09/07/2010  . SHOULDER ARTHROSCOPY WITH ROTATOR CUFF REPAIR Right 06/2005   [name] 09/20/2010  . TOTAL KNEE ARTHROPLASTY  06/08/2011   Procedure: TOTAL KNEE ARTHROPLASTY;  Surgeon: [name], MD;  Location: [facility];  Service: Orthopedics;  Laterality: Left;  . TOTAL SHOULDER ARTHROPLASTY Right 04/06/2016   Procedure: RIGHT REVERSE TOTAL SHOULDER ARTHROPLASTY;  Surgeon: [name], MD;  Location: [facility];  Service: Orthopedics;  Laterality: Right;  . US ECHOCARDIOGRAPHY  12/15/2008   EF 60-65%  . US ECHOCARDIOGRAPHY  09/28/2004  EF 55-60%   Social History   Social History  . Marital status: Married    Spouse name: N/A  . Number of children: N/A  . Years of education: N/A   Social History Main Topics  . Smoking status: Former Smoker    Quit date: 05/31/1989  . Smokeless tobacco: Never Used     Comment: 08/15/2016 "hadn't smoked a carton of cigarettes all my life"  . Alcohol use 0.0 oz/week     Comment: 08/15/2016 "did have 6-8 beers per day, has not had any since end of October 2017  . Drug use: No  . Sexual activity: Not Currently   Other Topics Concern  . Not on  file   Social History Narrative  . No narrative on file   Family History  Problem Relation Age of Onset  . Heart attack Father   . Heart disease Brother   . Allergic rhinitis Neg Hx   . Angioedema Neg Hx   . Asthma Neg Hx   . Atopy Neg Hx   . Eczema Neg Hx   . Immunodeficiency Neg Hx   . Urticaria Neg Hx    Allergies  Allergen Reactions  . Adhesive [Tape] Rash    Paper tape okay  . Latex Rash   Prior to Admission medications   Medication Sig Start Date End Date Taking? Authorizing Provider  allopurinol (ZYLOPRIM) 300 MG tablet Take 300 mg by mouth daily.    [provider]  aspirin EC 81 MG EC tablet Take 1 tablet (81 mg total) by mouth daily. 08/17/16   Cheryln Manly, NP  atorvastatin (LIPITOR) 80 MG tablet Take 1 tablet (80 mg total) by mouth every evening. 08/17/16   Cheryln Manly, NP  clopidogrel (PLAVIX) 75 MG tablet Take 75 mg by mouth daily.    [provider]  EPINEPHrine 0.3 mg/0.3 mL IJ SOAJ injection USE AS DIRECTED IF LIFE THREATENING REACTION OCCURS 08/07/15   [provider]  hydrochlorothiazide (MICROZIDE) 12.5 MG capsule Take 1 capsule by mouth daily. 09/02/16   [provider]  HYDROcodone-acetaminophen (NORCO/VICODIN) 5-325 MG tablet Take 1 tablet by mouth daily as needed. 09/02/16   [provider]  isosorbide mononitrate (IMDUR) 60 MG 24 hr tablet Take 1 tablet (60 mg total) by mouth daily. 08/26/16   Almyra Deforest, PA  LORazepam (ATIVAN) 0.5 MG tablet Take 0.5 mg by mouth 2 (two) times daily as needed for anxiety.  08/07/15   [provider]  metoprolol tartrate (LOPRESSOR) 50 MG tablet Take 1 tablet (50 mg total) by mouth daily. 10/07/16   Martinique, Peter M, MD  nitroGLYCERIN (NITROSTAT) 0.4 MG SL tablet Place 1 tablet (0.4 mg total) under the tongue every 5 (five) minutes x 3 doses as needed for chest pain. 08/17/16   Cheryln Manly, NP  pantoprazole (PROTONIX) 40 MG tablet Take 40 mg by mouth daily.     [provider]  valsartan (DIOVAN) 320 MG tablet Take 320 mg by mouth daily. 06/01/15   [provider]   No results found.  Positive ROS: All other systems have been reviewed and were otherwise negative with the exception of those mentioned in the HPI and as above.  Labs cbc No results for input(s): WBC, HGB, HCT, PLT in the last 72 hours.  Labs inflam No results for input(s): CRP in the last 72 hours.  Invalid input(s): ESR  Labs coag No results for input(s): INR, PTT in the last 72 hours.  Invalid input(s): PT  No results for input(s): NA, K, CL, CO2, GLUCOSE, BUN, CREATININE, CALCIUM in the last 72 hours.  Physical Exam: There were no vitals filed for this visit. General: Alert, no acute distress Cardiovascular: No pedal edema Respiratory: No cyanosis, no use of accessory musculature GI: No organomegaly, abdomen is soft and non-tender Skin: No lesions in the area of chief complaint.  No active drainage from incision. Neurologic: Sensation intact distally Psychiatric: Patient is competent for consent with normal mood and affect Lymphatic: No axillary or cervical lymphadenopathy  MUSCULOSKELETAL:  50% active ROM right shoulder.  Marked ttp, swelling and tenseness to anterior aspect of right shoulder extending into the deltoid.  No drainage from the incision.  NV intact distally. Other extremities are atraumatic with painless ROM and NVI.  Assessment: Right shoulder infection  Plan: Right shoulder I&D by Dr. Edmonia Lynch today.  Possible poly swap if joint involvement    Fannie Knee, PA-C Cell [phone]   10/18/2016 10:18 AM

## 2016-10-18 NOTE — Op Note (Signed)
10/18/2016  1:44 PM  PATIENT:  Jake Dunn    PRE-OPERATIVE DIAGNOSIS:  Abscess Right Shoulder   POST-OPERATIVE DIAGNOSIS:  Same  PROCEDURE:  INCISION AND DRAINAGE ABSCESS  SURGEON:  Jerri Glauser, Ernesta Amble, MD  ASSISTANT: Roxan Hockey, PA-C, he was present and scrubbed throughout the case, critical for completion in a timely fashion, and for retraction, instrumentation, and closure.   ANESTHESIA:   gen  PREOPERATIVE INDICATIONS:  [name] is a  76 y.o. male with a diagnosis of Abscess Right Shoulder  who failed conservative measures and elected for surgical management.    The risks benefits and alternatives were discussed with the patient preoperatively including but not limited to the risks of infection, bleeding, nerve injury, cardiopulmonary complications, the need for revision surgery, among others, and the patient was willing to proceed.  OPERATIVE IMPLANTS: none  OPERATIVE FINDINGS: purulent fluid with no penetration to joint  BLOOD LOSS: 150  COMPLICATIONS: none  TOURNIQUET TIME: none  OPERATIVE PROCEDURE:  Patient was identified in the preoperative holding area and site was marked by me He was transported to the operating theater and placed on the table in supine position taking care to pad all bony prominences. After a preincinduction time out anesthesia was induced. The right upper extremity was prepped and draped in normal sterile fashion and a pre-incision timeout was performed. He received held pre op then ancef after cutlrue.  for preoperative antibiotics.   I re-incised his deltopectoral interval and bluntly dissected this apart. I protected his vein.  Once I incised the next layer he had a large blush of purulent fluid expressed this and sent it for culture he was then given Ancef for preoperative antibiotics.  I debrided any necrotic tissue with a scissor and pickup there was little to no necrotic tissue.  I probed to express all areas of this abscess.  It did track medially and down to his anterior humerus but did not track under his acromion or deep to his coracoid. There was no penetration of his joint capsule. There is no palpable effusion.  At this point I elected to not perform a poly-swap and not expose his joint capsule or space to the infection. I changed gloves redraped irrigated again and then closed his interval and skin I then injected local tobramycin totaling  150 mg in dilute saline. He was awoken and taken the PACU in stable condition POST OPERATIVE PLAN: Mobilize for DVT prophylaxis PICC line tomorrow on IV antibiotics for 4-6 weeks

## 2016-10-18 NOTE — Anesthesia Procedure Notes (Signed)
Procedure Name: Intubation Date/Time: 10/18/2016 12:43 PM Performed by: Melynda Ripple D Pre-anesthesia Checklist: Patient identified, Emergency Drugs available, Suction available and Patient being monitored Patient Re-evaluated:Patient Re-evaluated prior to inductionOxygen Delivery Method: Circle system utilized Preoxygenation: Pre-oxygenation with 100% oxygen Intubation Type: IV induction Ventilation: Mask ventilation without difficulty Laryngoscope Size: Mac and 3 Grade View: Grade I Tube type: Oral Tube size: 7.0 mm Number of attempts: 1 Airway Equipment and Method: Stylet and Oral airway Placement Confirmation: ETT inserted through vocal cords under direct vision,  positive ETCO2 and breath sounds checked- equal and bilateral Secured at: 24 cm Tube secured with: Tape Dental Injury: Teeth and Oropharynx as per pre-operative assessment

## 2016-10-18 NOTE — Progress Notes (Signed)
Pt ambulated to RCC, VSS taking po's and up to void.

## 2016-10-19 ENCOUNTER — Ambulatory Visit (HOSPITAL_COMMUNITY)
Admission: RE | Admit: 2016-10-19 | Discharge: 2016-10-19 | Disposition: A | Discharge status: Home / Self Care | Payer: PPO | Source: Ambulatory Visit | Attending: Orthopedic Surgery | Admitting: Orthopedic Surgery

## 2016-10-19 ENCOUNTER — Ambulatory Visit (HOSPITAL_COMMUNITY)
Admit: 2016-10-19 | Discharge: 2016-10-19 | Disposition: A | Discharge status: Home / Self Care | Payer: PPO | Attending: Physician Assistant | Admitting: Physician Assistant

## 2016-10-19 ENCOUNTER — Other Ambulatory Visit (HOSPITAL_BASED_OUTPATIENT_CLINIC_OR_DEPARTMENT_OTHER): Payer: Self-pay | Admitting: Physician Assistant

## 2016-10-19 ENCOUNTER — Encounter (HOSPITAL_BASED_OUTPATIENT_CLINIC_OR_DEPARTMENT_OTHER): Payer: Self-pay | Admitting: Orthopedic Surgery

## 2016-10-19 DIAGNOSIS — B999 Unspecified infectious disease: Secondary | ICD-10-CM

## 2016-10-19 DIAGNOSIS — Z452 Encounter for adjustment and management of vascular access device: Secondary | ICD-10-CM | POA: Diagnosis not present

## 2016-10-19 DIAGNOSIS — L02413 Cutaneous abscess of right upper limb: Secondary | ICD-10-CM | POA: Diagnosis not present

## 2016-10-19 DIAGNOSIS — M01X11 Direct infection of right shoulder in infectious and parasitic diseases classified elsewhere: Secondary | ICD-10-CM | POA: Diagnosis not present

## 2016-10-19 HISTORY — PX: IR US GUIDE VASC ACCESS LEFT: IMG2389

## 2016-10-19 HISTORY — PX: IR FLUORO GUIDE CV LINE LEFT: IMG2282

## 2016-10-19 MED ORDER — HEPARIN SOD (PORK) LOCK FLUSH 100 UNIT/ML IV SOLN
INTRAVENOUS | Status: DC | PRN
  Administered 2016-10-19 (×2): 500 [IU] via INTRAVENOUS

## 2016-10-19 MED ORDER — SODIUM CHLORIDE 0.9 % IV SOLN
INTRAVENOUS | Status: AC
  Administered 2016-10-19: 15:00:00 via INTRAVENOUS

## 2016-10-19 MED ORDER — LIDOCAINE HCL 1 % IJ SOLN
INTRAMUSCULAR | Status: AC
  Filled 2016-10-19: qty 20

## 2016-10-19 MED ORDER — VANCOMYCIN HCL 10 G IV SOLR
1250.0000 mg | Freq: Once | INTRAVENOUS | Status: AC
  Administered 2016-10-19: 1250 mg via INTRAVENOUS
  Filled 2016-10-19: qty 1250

## 2016-10-19 MED ORDER — DEXTROSE 5 % IV SOLN
2.0000 g | Freq: Once | INTRAVENOUS | Status: AC
  Administered 2016-10-19: 2 g via INTRAVENOUS
  Filled 2016-10-19: qty 2

## 2016-10-19 MED ORDER — LIDOCAINE HCL (PF) 1 % IJ SOLN
INTRAMUSCULAR | Status: DC | PRN
  Administered 2016-10-19: 10 mL

## 2016-10-19 MED ORDER — HEPARIN SOD (PORK) LOCK FLUSH 100 UNIT/ML IV SOLN
INTRAVENOUS | Status: AC
  Filled 2016-10-19: qty 5

## 2016-10-19 MED ORDER — HEPARIN SOD (PORK) LOCK FLUSH 100 UNIT/ML IV SOLN
250.0000 [IU] | INTRAVENOUS | Status: AC | PRN
  Administered 2016-10-19: 250 [IU]
  Filled 2016-10-19: qty 5

## 2016-10-19 NOTE — Discharge Instructions (Signed)
Ceftriaxone injection What is this medicine? CEFTRIAXONE (sef try AX one) is a cephalosporin antibiotic. It is used to treat certain kinds of bacterial infections. It will not work for colds, flu, or other viral infections. This medicine may be used for other purposes; ask your health care provider or pharmacist if you have questions. COMMON BRAND NAME(S): Rocephin What should I tell my health care provider before I take this medicine? They need to know if you have any of these conditions: -any chronic illness -bowel disease, like colitis -both kidney and liver disease -high bilirubin level in newborn patients -an unusual or allergic reaction to ceftriaxone, other cephalosporin or penicillin antibiotics, foods, dyes, or preservatives -pregnant or trying to get pregnant -breast-feeding How should I use this medicine? This medicine is injected into a muscle or infused it into a vein. It is usually given in a medical office or clinic. If you are to give this medicine you will be taught how to inject it. Follow instructions carefully. Use your doses at regular intervals. Do not take your medicine more often than directed. Do not skip doses or stop your medicine early even if you feel better. Do not stop taking except on your doctor's advice. Talk to your pediatrician regarding the use of this medicine in children. Special care may be needed. Overdosage: If you think you have taken too much of this medicine contact a poison control center or emergency room at once. NOTE: This medicine is only for you. Do not share this medicine with others. What if I miss a dose? If you miss a dose, take it as soon as you can. If it is almost time for your next dose, take only that dose. Do not take double or extra doses. What may interact with this medicine? Do not take this medicine with any of the following medications: -intravenous calcium This medicine may also interact with the following medications: -birth  control pills This list may not describe all possible interactions. Give your health care provider a list of all the medicines, herbs, non-prescription drugs, or dietary supplements you use. Also tell them if you smoke, drink alcohol, or use illegal drugs. Some items may interact with your medicine. What should I watch for while using this medicine? Tell your doctor or health care professional if your symptoms do not improve or if they get worse. Do not treat diarrhea with over the counter products. Contact your doctor if you have diarrhea that lasts more than 2 days or if it is severe and watery. If you are being treated for a sexually transmitted disease, avoid sexual contact until you have finished your treatment. Having sex can infect your sexual partner. Calcium may bind to this medicine and cause lung or kidney problems. Avoid calcium products while taking this medicine and for 48 hours after taking the last dose of this medicine. What side effects may I notice from receiving this medicine? Side effects that you should report to your doctor or health care professional as soon as possible: -allergic reactions like skin rash, itching or hives, swelling of the face, lips, or tongue -breathing problems -fever, chills -irregular heartbeat -pain when passing urine -seizures -stomach pain, cramps -unusual bleeding, bruising -unusually weak or tired Side effects that usually do not require medical attention (report to your doctor or health care professional if they continue or are bothersome): -diarrhea -dizzy, drowsy -headache -nausea, vomiting -pain, swelling, irritation where injected -stomach upset -sweating This list may not describe all possible side effects.  Call your doctor for medical advice about side effects. You may report side effects to FDA at 1-800-FDA-1088. Where should I keep my medicine? Keep out of the reach of children. Store at room temperature below 25 degrees C (77  degrees F). Protect from light. Throw away any unused vials after the expiration date. NOTE: This sheet is a summary. It may not cover all possible information. If you have questions about this medicine, talk to your doctor, pharmacist, or health care provider.  2018 Elsevier/Gold Standard (2013-11-11 09:14:54)         Vancomycin injection What is this medicine? VANCOMYCIN Lucianne Lei koe MYE sin) is a glycopeptide antibiotic. It is used to treat certain kinds of bacterial infections. It will not work for colds, flu, or other viral infections. This medicine may be used for other purposes; ask your health care provider or pharmacist if you have questions. COMMON BRAND NAME(S): Vancocin What should I tell my health care provider before I take this medicine? They need to know if you have any of these conditions: -dehydration -hearing loss -kidney disease -other chronic illness -an unusual or allergic reaction to vancomycin, other medicines, foods, dyes, or preservatives -pregnant or trying to get pregnant -breast-feeding How should I use this medicine? This medicine is infused into a vein. It is usually given by a health care provider in a hospital or clinic. If you receive this medicine at home, you will receive special instructions. Take your medicine at regular intervals. Do not take your medicine more often than directed. Take all of your medicine as directed even if you think you are better. Do not skip doses or stop your medicine early. It is important that you put your used needles and syringes in a special sharps container. Do not put them in a trash can. If you do not have a sharps container, call your pharmacist or healthcare provider to get one. Talk to your pediatrician regarding the use of this medicine in children. While this drug may be prescribed for even very young infants for selected conditions, precautions do apply. Overdosage: If you think you have taken too much of this  medicine contact a poison control center or emergency room at once. NOTE: This medicine is only for you. Do not share this medicine with others. What if I miss a dose? If you miss a dose, take it as soon as you can. If it is almost time for your next dose, take only that dose. Do not take double or extra doses. What may interact with this medicine? -amphotericin B -anesthetics -bacitracin -birth control pills -cisplatin -colistin -diuretics -other aminoglycoside antibiotics -polymyxin B This list may not describe all possible interactions. Give your health care provider a list of all the medicines, herbs, non-prescription drugs, or dietary supplements you use. Also tell them if you smoke, drink alcohol, or use illegal drugs. Some items may interact with your medicine. What should I watch for while using this medicine? Tell your doctor or health care professional if your symptoms do not improve or if you get new symptoms. Your condition and lab work will be monitored while you are taking this medicine. Do not treat diarrhea with over the counter products. Contact your doctor if you have diarrhea that lasts more than 2 days or if it is severe and watery. What side effects may I notice from receiving this medicine? Side effects that you should report to your doctor or health care professional as soon as possible: -allergic reactions like skin  rash, itching or hives, swelling of the face, lips, or tongue -breathing difficulty, wheezing -change in amount, color of urine -change in hearing -chest pain -dizziness -fever, chills -flushing of the face and neck (reddening) -low blood pressure -redness, blistering, peeling or loosening of the skin, including inside the mouth -unusual bleeding or bruising -unusually weak or tired Side effects that usually do not require medical attention (report to your doctor or health care professional if they continue or are bothersome): -nausea,  vomiting -pain, swelling where injected -stomach cramps This list may not describe all possible side effects. Call your doctor for medical advice about side effects. You may report side effects to FDA at 1-800-FDA-1088. Where should I keep my medicine? Keep out of the reach of children. You will be instructed on how to store this medicine, if needed. Throw away any unused medicine after the expiration date on the label. NOTE: This sheet is a summary. It may not cover all possible information. If you have questions about this medicine, talk to your doctor, pharmacist, or health care provider.  2018 Elsevier/Gold Standard (2012-11-30 14:46:02)

## 2016-10-19 NOTE — Procedures (Signed)
Successful placement of single lumen PICC line to left basilic vein. Length 46 cm Tip at lower SVC/RA No complications Ready for use.  Murrell Redden PA-C 10/19/2016 12:31

## 2016-10-20 ENCOUNTER — Encounter (HOSPITAL_BASED_OUTPATIENT_CLINIC_OR_DEPARTMENT_OTHER): Payer: Self-pay | Admitting: Orthopedic Surgery

## 2016-10-20 DIAGNOSIS — Z452 Encounter for adjustment and management of vascular access device: Secondary | ICD-10-CM | POA: Diagnosis not present

## 2016-10-20 DIAGNOSIS — L02413 Cutaneous abscess of right upper limb: Secondary | ICD-10-CM | POA: Diagnosis not present

## 2016-10-20 DIAGNOSIS — M19011 Primary osteoarthritis, right shoulder: Secondary | ICD-10-CM | POA: Diagnosis not present

## 2016-10-20 NOTE — Anesthesia Postprocedure Evaluation (Signed)
Anesthesia Post Note  Patient: Jake Dunn  Procedure(s) Performed: Procedure(s) (LRB): INCISION AND DRAINAGE ABSCESS RIGHT SHOULDER (Right)     Patient location during evaluation: PACU Anesthesia Type: General Level of consciousness: sedated and patient cooperative Pain management: pain level controlled Vital Signs Assessment: post-procedure vital signs reviewed and stable Respiratory status: spontaneous breathing Cardiovascular status: stable Anesthetic complications: no    Last Vitals:  Vitals:   10/19/16 0545 10/19/16 0600  BP:  135/79  Pulse: 76 68  Resp: 18 18  Temp:  36.7 C    Last Pain:  Vitals:   10/19/16 0500  TempSrc:   PainSc: Asleep   Pain Goal: Patients Stated Pain Goal: 3 (10/19/16 0006)               Nolon Nations

## 2016-10-21 DIAGNOSIS — Z452 Encounter for adjustment and management of vascular access device: Secondary | ICD-10-CM | POA: Diagnosis not present

## 2016-10-21 DIAGNOSIS — M19011 Primary osteoarthritis, right shoulder: Secondary | ICD-10-CM | POA: Diagnosis not present

## 2016-10-21 DIAGNOSIS — L02413 Cutaneous abscess of right upper limb: Secondary | ICD-10-CM | POA: Diagnosis not present

## 2016-10-24 ENCOUNTER — Telehealth: Payer: Self-pay | Admitting: *Deleted

## 2016-10-24 ENCOUNTER — Ambulatory Visit (INDEPENDENT_AMBULATORY_CARE_PROVIDER_SITE_OTHER): Payer: PPO | Admitting: Internal Medicine

## 2016-10-24 ENCOUNTER — Encounter: Payer: Self-pay | Admitting: Internal Medicine

## 2016-10-24 VITALS — BP 138/81 | HR 74 | Temp 98.6°F | Wt 224.0 lb

## 2016-10-24 DIAGNOSIS — L02413 Cutaneous abscess of right upper limb: Secondary | ICD-10-CM | POA: Diagnosis not present

## 2016-10-24 DIAGNOSIS — M19011 Primary osteoarthritis, right shoulder: Secondary | ICD-10-CM | POA: Diagnosis not present

## 2016-10-24 DIAGNOSIS — Z452 Encounter for adjustment and management of vascular access device: Secondary | ICD-10-CM | POA: Diagnosis not present

## 2016-10-24 LAB — AEROBIC/ANAEROBIC CULTURE (SURGICAL/DEEP WOUND)

## 2016-10-24 LAB — AEROBIC/ANAEROBIC CULTURE W GRAM STAIN (SURGICAL/DEEP WOUND)

## 2016-10-24 NOTE — Progress Notes (Signed)
[facility]      Reason for Consult: right shoulder infection    Referring Physician: Dr. Maryla Morrow    Patient ID: Jake Dunn, male    DOB: 1941/05/05, 76 y.o.   MRN: [id number]  HPI:   He comes in for evaluation of right shoulder infection. He has a history of right shoulder joint replacement done in November 2017 by Dr. Maryla Morrow due to end-stage arthritis.  After surgery he did well until about two weeks prior to presentation back to Dr. Percell Miller and had pain, swelling of the shoulder.  No associated fever or chills.  He was started empirically on vancomycin and ceftriaxone after undergoing debridement by Dr. Alain Marion.  In the OR he was noted to have necrotic tissue, purulence but infection did not penetrate the joint.  Antibiotics were held until cultures sent. He had the surgery on 6/12.  Since then, he has taken the antibiotics well, no associated n/v/d.   Previous record reviewed of the OP report as above, infection did not reach the level of the joint, micro with propionibacterium acnes.    Past Medical History:  Diagnosis Date  . Adenomatous polyp   . Anxiety   . Arthritis    "knees, ankles, shoulders" (08/15/2016)  . CAD (coronary artery disease)    s/p cath in 2014 showing significant stenosis in the diagonal side branches and in the RV marginal branch. These vessels were small and diffusely diseased. He is managed medically. 08/16/16 Cath, no disease progression  . Chronic lower back pain   . Disc disease, degenerative, cervical   . GERD (gastroesophageal reflux disease)   . Gout   . Hyperlipidemia   . Hypertension   . Osteoarthritis   . Renal cell carcinoma    left kidney removal  . Stroke [facility])    MINI STROKE 15+ YRS AGO, no residual  . Stroke ([place])    "I've had 2 or 3"; denies residual on 08/15/2016  . Syncope and collapse     Prior to Admission medications   Medication Sig Start Date End Date Taking? Authorizing Provider  allopurinol  (ZYLOPRIM) 300 MG tablet Take 300 mg by mouth daily.   Yes [provider]  aspirin EC 81 MG EC tablet Take 1 tablet (81 mg total) by mouth daily. 08/17/16  Yes Cheryln Manly, NP  atorvastatin (LIPITOR) 80 MG tablet Take 1 tablet (80 mg total) by mouth every evening. 08/17/16  Yes Reino Bellis B, NP  clopidogrel (PLAVIX) 75 MG tablet Take 75 mg by mouth daily.   Yes [provider]  EPINEPHrine 0.3 mg/0.3 mL IJ SOAJ injection USE AS DIRECTED IF LIFE THREATENING REACTION OCCURS 08/07/15  Yes [provider]  hydrochlorothiazide (MICROZIDE) 12.5 MG capsule Take 1 capsule by mouth daily. 09/02/16  Yes [provider]  HYDROcodone-acetaminophen (NORCO/VICODIN) 5-325 MG tablet Take 1 tablet by mouth daily as needed. 09/02/16  Yes [provider]  isosorbide mononitrate (IMDUR) 60 MG 24 hr tablet Take 1 tablet (60 mg total) by mouth daily. 08/26/16  Yes Almyra Deforest, PA  LORazepam (ATIVAN) 0.5 MG tablet Take 0.5 mg by mouth 2 (two) times daily as needed for anxiety.  08/07/15  Yes [provider]  metoprolol tartrate (LOPRESSOR) 50 MG tablet Take 1 tablet (50 mg total) by mouth daily. 10/07/16  Yes Martinique, Peter M, MD  nitroGLYCERIN (NITROSTAT) 0.4 MG SL tablet Place 1 tablet (0.4 mg total) under the tongue every  5 (five) minutes x 3 doses as needed for chest pain. 08/17/16  Yes Reino Bellis B, NP  pantoprazole (PROTONIX) 40 MG tablet Take 40 mg by mouth daily.   Yes [provider]  valsartan (DIOVAN) 320 MG tablet Take 320 mg by mouth daily. 06/01/15  Yes [provider]    Allergies  Allergen Reactions  . Adhesive [Tape] Rash    Paper tape okay  . Latex Rash    Social History  Substance Use Topics  . Smoking status: Former Smoker    Quit date: 05/31/1989  . Smokeless tobacco: Never Used     Comment: 08/15/2016 "hadn't smoked a carton of cigarettes all my life"  . Alcohol use 0.0 oz/week     Comment: 08/15/2016 "did have 6-8  beers per day, has not had any since end of October 2017    Family History  Problem Relation Age of Onset  . Heart attack Father   . Heart disease Brother   . Allergic rhinitis Neg Hx   . Angioedema Neg Hx   . Asthma Neg Hx   . Atopy Neg Hx   . Eczema Neg Hx   . Immunodeficiency Neg Hx   . Urticaria Neg Hx     Review of Systems  Constitutional: negative for fevers, chills, fatigue and malaise Gastrointestinal: negative for diarrhea Integument/breast: negative for rash All other systems reviewed and are negative    Constitutional: in no apparent distress  Vitals:   10/24/16 1617  BP: 138/81  Pulse: 74  Temp: 98.6 F (37 C)   EYES: anicteric ENMT: no thrush Cardiovascular: Cor RRR Respiratory: CTA B; normal respiratory effort GI: Bowel sounds are normal, liver is not enlarged, spleen is not enlarged, soft Musculoskeletal: no pedal edema noted Skin: negatives: no rash Hematologic: no cervical lad  Labs: Lab Results  Component Value Date   WBC 6.2 08/16/2016   HGB 13.5 08/16/2016   HCT 41.0 08/16/2016   MCV 90.3 08/16/2016   PLT 232 08/16/2016    Lab Results  Component Value Date   CREATININE 1.23 08/16/2016   BUN 18 08/16/2016   NA 139 08/16/2016   K 4.0 08/16/2016   CL 106 08/16/2016   CO2 22 08/16/2016    Lab Results  Component Value Date   ALT 15 (L) 03/25/2016   AST 20 03/25/2016   ALKPHOS 70 03/25/2016   BILITOT 0.7 03/25/2016   INR 1.03 08/16/2016     Assessment: shoulder infection with Proprionibacterium.  This is a typical bacteria for shoulder infections and shoulder PJIs (which this is not).  I will target antibiotics to this.  Plan: 1) stop vancomycin and ceftriaxone 2) start continuous infusion of penicillin 3) ESR and CRP weekly  4) he will follow up with me in 2 weeks Above discussed with [place]

## 2016-10-24 NOTE — Telephone Encounter (Signed)
Per Dr Linus Salmons called [facility] to have them D/C the patient IV Vanc and Ceftriaxone. He was changed to Penicillian 20 million units/24 hours until 11/14/16. Also verified he wants a sed rate and ESR weekly and dressing change to wound on R shoulder.

## 2016-10-25 NOTE — Telephone Encounter (Signed)
Patietn's mother had questions about what she should be giving patient while waiting for penicillin to be authorized by insurance Darnelle Maffucci already submitted the PA request, is waiting for a response.) Routed the call to [facility] for advice.

## 2016-10-25 NOTE — Telephone Encounter (Signed)
Per Melissa, Penicillin is nonformulary and requiring a prior authorization. PA phone number is [phone].

## 2016-10-26 ENCOUNTER — Telehealth: Payer: Self-pay | Admitting: Pharmacist

## 2016-10-26 NOTE — Telephone Encounter (Signed)
I think we should use vancomycin instead of ceftriaxone, I don't think cephalosporins are active for Proprionibacterium.

## 2016-10-26 NOTE — Telephone Encounter (Signed)
Per Dr Linus Salmons called [facility] and advised to disregard the order for Penicillian and have the patient continue on ceftriaxone alone until stop date of 11/14/16.

## 2016-10-26 NOTE — Telephone Encounter (Signed)
Spoke with wife and told her to stop the vancomycin and continue the rocephin until she gets the Penicillin.  Explained why to her and that it may take a few days for the PA to be approved. She verbalized understanding.

## 2016-10-26 NOTE — Telephone Encounter (Signed)
Called patient pharmacy to check status and despite verifying provider info they had the wrong provider in and information sent to incorrect office. Updated info and answered questions for PA over the phone and asked for STAT processing. Was advised we should hear something in next 24 hours.   [facility] aware and will be notified as soon as we hear from insurance.

## 2016-10-26 NOTE — Telephone Encounter (Signed)
Never mind, looks like ceftriaxone is good for this.  I think really we can just ditch the penicillin PA and just use ceftriaxone and avoid the paperwork. thanks

## 2016-10-26 NOTE — Telephone Encounter (Signed)
Sounds good.  I called patient and wife and let them know.

## 2016-10-26 NOTE — Telephone Encounter (Signed)
Per Dr. Linus Salmons -- Spoke with [name], Jake Dunn's wife and told her to just continue the ceftriaxone for the rest of his therapy.  We will d/c the PCN order. She verbalized understanding.  Janyce Llanos, CMA, is calling [facility] to give orders.

## 2016-10-28 ENCOUNTER — Telehealth: Payer: Self-pay

## 2016-10-28 DIAGNOSIS — M19011 Primary osteoarthritis, right shoulder: Secondary | ICD-10-CM | POA: Diagnosis not present

## 2016-10-28 NOTE — Telephone Encounter (Signed)
Patient was seen by Dr Percell Miller this morning.   Wife wants to be sure he is on the correct antibiotic.  The initial script was denied by insurance . ( not sure of initial medication)  She states she is willing to fight with the insurance company  For the correct drugif needed.  She really wants to be sure the vancomycin will clear up this infection.  She has requested to hear  from Dr Comer/ our office directly. Not the home health company.   Please advise.    Laverle Patter, RN

## 2016-10-28 NOTE — Telephone Encounter (Signed)
The PA that was denied was for penicillin.  In discussion with pharmacy, ceftriaxone will be just fine so no need to fight with insurance.  No need for vancomycin.

## 2016-10-31 DIAGNOSIS — I10 Essential (primary) hypertension: Secondary | ICD-10-CM | POA: Diagnosis not present

## 2016-10-31 DIAGNOSIS — Z7982 Long term (current) use of aspirin: Secondary | ICD-10-CM | POA: Diagnosis not present

## 2016-10-31 DIAGNOSIS — I2510 Atherosclerotic heart disease of native coronary artery without angina pectoris: Secondary | ICD-10-CM | POA: Diagnosis not present

## 2016-10-31 DIAGNOSIS — Z452 Encounter for adjustment and management of vascular access device: Secondary | ICD-10-CM | POA: Diagnosis not present

## 2016-10-31 DIAGNOSIS — M19011 Primary osteoarthritis, right shoulder: Secondary | ICD-10-CM | POA: Diagnosis not present

## 2016-10-31 DIAGNOSIS — Z7902 Long term (current) use of antithrombotics/antiplatelets: Secondary | ICD-10-CM | POA: Diagnosis not present

## 2016-10-31 DIAGNOSIS — Z8673 Personal history of transient ischemic attack (TIA), and cerebral infarction without residual deficits: Secondary | ICD-10-CM | POA: Diagnosis not present

## 2016-10-31 DIAGNOSIS — L02413 Cutaneous abscess of right upper limb: Secondary | ICD-10-CM | POA: Diagnosis not present

## 2016-10-31 NOTE — Telephone Encounter (Signed)
Contacted [facility] and she is only getting the rocephin no Vanc.

## 2016-11-03 DIAGNOSIS — M4722 Other spondylosis with radiculopathy, cervical region: Secondary | ICD-10-CM | POA: Diagnosis not present

## 2016-11-03 DIAGNOSIS — M4712 Other spondylosis with myelopathy, cervical region: Secondary | ICD-10-CM | POA: Insufficient documentation

## 2016-11-04 ENCOUNTER — Telehealth: Payer: Self-pay | Admitting: *Deleted

## 2016-11-04 DIAGNOSIS — M19011 Primary osteoarthritis, right shoulder: Secondary | ICD-10-CM | POA: Diagnosis not present

## 2016-11-04 NOTE — Telephone Encounter (Signed)
Patient was notified that the penicillin WAS approved by the insurance company.  Patient's wife wanted to let Dr. Linus Salmons know about the approval.  RN will notify the [facility] pharmacist for any changes.  The patient is currently receiving ceftriaxone.

## 2016-11-07 DIAGNOSIS — Z8673 Personal history of transient ischemic attack (TIA), and cerebral infarction without residual deficits: Secondary | ICD-10-CM | POA: Diagnosis not present

## 2016-11-07 DIAGNOSIS — Z7902 Long term (current) use of antithrombotics/antiplatelets: Secondary | ICD-10-CM | POA: Diagnosis not present

## 2016-11-07 DIAGNOSIS — M19011 Primary osteoarthritis, right shoulder: Secondary | ICD-10-CM | POA: Diagnosis not present

## 2016-11-07 DIAGNOSIS — I10 Essential (primary) hypertension: Secondary | ICD-10-CM | POA: Diagnosis not present

## 2016-11-07 DIAGNOSIS — Z452 Encounter for adjustment and management of vascular access device: Secondary | ICD-10-CM | POA: Diagnosis not present

## 2016-11-07 DIAGNOSIS — I2510 Atherosclerotic heart disease of native coronary artery without angina pectoris: Secondary | ICD-10-CM | POA: Diagnosis not present

## 2016-11-07 DIAGNOSIS — Z7982 Long term (current) use of aspirin: Secondary | ICD-10-CM | POA: Diagnosis not present

## 2016-11-07 DIAGNOSIS — L02413 Cutaneous abscess of right upper limb: Secondary | ICD-10-CM | POA: Diagnosis not present

## 2016-11-14 DIAGNOSIS — Z8673 Personal history of transient ischemic attack (TIA), and cerebral infarction without residual deficits: Secondary | ICD-10-CM | POA: Diagnosis not present

## 2016-11-14 DIAGNOSIS — M19011 Primary osteoarthritis, right shoulder: Secondary | ICD-10-CM | POA: Diagnosis not present

## 2016-11-14 DIAGNOSIS — Z7902 Long term (current) use of antithrombotics/antiplatelets: Secondary | ICD-10-CM | POA: Diagnosis not present

## 2016-11-14 DIAGNOSIS — Z7982 Long term (current) use of aspirin: Secondary | ICD-10-CM | POA: Diagnosis not present

## 2016-11-14 DIAGNOSIS — I10 Essential (primary) hypertension: Secondary | ICD-10-CM | POA: Diagnosis not present

## 2016-11-14 DIAGNOSIS — I2510 Atherosclerotic heart disease of native coronary artery without angina pectoris: Secondary | ICD-10-CM | POA: Diagnosis not present

## 2016-11-14 DIAGNOSIS — L02413 Cutaneous abscess of right upper limb: Secondary | ICD-10-CM | POA: Diagnosis not present

## 2016-11-14 DIAGNOSIS — Z452 Encounter for adjustment and management of vascular access device: Secondary | ICD-10-CM | POA: Diagnosis not present

## 2016-11-15 ENCOUNTER — Ambulatory Visit (INDEPENDENT_AMBULATORY_CARE_PROVIDER_SITE_OTHER): Payer: PPO | Admitting: Internal Medicine

## 2016-11-15 ENCOUNTER — Encounter: Payer: Self-pay | Admitting: Internal Medicine

## 2016-11-15 DIAGNOSIS — Z452 Encounter for adjustment and management of vascular access device: Secondary | ICD-10-CM | POA: Diagnosis not present

## 2016-11-15 DIAGNOSIS — Z5181 Encounter for therapeutic drug level monitoring: Secondary | ICD-10-CM | POA: Diagnosis not present

## 2016-11-15 DIAGNOSIS — L02413 Cutaneous abscess of right upper limb: Secondary | ICD-10-CM

## 2016-11-15 NOTE — Progress Notes (Signed)
Subjective:    Patient ID: Jake Dunn, male    DOB: 1941/04/06, 76 y.o.   MRN: [id number]  HPI Here for follow up of shoulder infection He underwent debridement 6/12 without joint involvement or aspiration of joint (did not enter joint) and culture did grow Proprionibacterium.  He was initially on broad antibiotics and was narrowed to ceftriaxone and has been on antibiotics about 4 weeks.  He is complaining of some increase in pain today, over the last several days and some erythema around the insicison.  His CRP/ESR have been stable, not worse.  He is having no fever.  No rash or diarrhea.  He did report there was some serosanguinous drainage at the site up until 2 days ago, but never any pus.     Review of Systems  Constitutional: Negative for fatigue and fever.  Gastrointestinal: Negative for diarrhea and nausea.       Objective:   Physical Exam  Constitutional: He appears well-developed and well-nourished.  Musculoskeletal:  His incision looks good.  There is some erythema in the mid portion around the sutures but it is not warm skin and his shoulder joint does not feel warm.  No drainage and the incision looks closed.  Sutures remain in place.     SH: history of smoking but minimal and remote       Assessment & Plan:

## 2016-11-15 NOTE — Assessment & Plan Note (Signed)
Stable wbc.  Will continue to monitor weekly

## 2016-11-15 NOTE — Assessment & Plan Note (Addendum)
clincially it seems to be doing well but with an increase in pain I worry about the joint.  I am going to recheck his ESR and CRP and see if these are concerning.  If not, he will see Dr. Maryla Morrow on Friday for reassessment.  If there are concerns with the ESR/CRP I will call Dr. Percell Miller or PA.  No changes otherwise.  His stop date is about 2 more weeks and if labs ok, will stop at that time, 7/23.  He will follow up with the NP at that time and if closed without concerns will stop.   ADDENDUM: his ESR is 33 and CRP 12.5, which are both mildly elevated but not highly suggestive of active worsening.  I will let Dr. Percell Miller know prior to his appt Friday.

## 2016-11-15 NOTE — Assessment & Plan Note (Signed)
This is working, no issues

## 2016-11-18 DIAGNOSIS — M19011 Primary osteoarthritis, right shoulder: Secondary | ICD-10-CM | POA: Diagnosis not present

## 2016-11-21 ENCOUNTER — Telehealth: Payer: Self-pay | Admitting: *Deleted

## 2016-11-21 DIAGNOSIS — Z8673 Personal history of transient ischemic attack (TIA), and cerebral infarction without residual deficits: Secondary | ICD-10-CM | POA: Diagnosis not present

## 2016-11-21 DIAGNOSIS — Z452 Encounter for adjustment and management of vascular access device: Secondary | ICD-10-CM | POA: Diagnosis not present

## 2016-11-21 DIAGNOSIS — M19011 Primary osteoarthritis, right shoulder: Secondary | ICD-10-CM | POA: Diagnosis not present

## 2016-11-21 DIAGNOSIS — I2510 Atherosclerotic heart disease of native coronary artery without angina pectoris: Secondary | ICD-10-CM | POA: Diagnosis not present

## 2016-11-21 DIAGNOSIS — L02413 Cutaneous abscess of right upper limb: Secondary | ICD-10-CM | POA: Diagnosis not present

## 2016-11-21 DIAGNOSIS — I10 Essential (primary) hypertension: Secondary | ICD-10-CM | POA: Diagnosis not present

## 2016-11-21 DIAGNOSIS — Z7902 Long term (current) use of antithrombotics/antiplatelets: Secondary | ICD-10-CM | POA: Diagnosis not present

## 2016-11-21 DIAGNOSIS — Z7982 Long term (current) use of aspirin: Secondary | ICD-10-CM | POA: Diagnosis not present

## 2016-11-21 NOTE — Telephone Encounter (Signed)
Called patient and he said he has not had any improvement in his pain since last seen on 11/15/16. Advised him I would let Dr. Linus Salmons know and call him back. I did let him know that his infection markers were up just slightly. Myrtis Hopping

## 2016-11-21 NOTE — Telephone Encounter (Signed)
-----  Message from Thayer Headings, MD sent at 11/18/2016  2:19 PM EDT ----- Could you call and see how his shoulder if feeling compared to when I saw him Tuesday?  He was having more pain but seemed to be healing.  He also saw Dr. Percell Miller the surgeon today as well.  His labs including ESR and  CRP were up a bit but not signficantly.   Thanks!

## 2016-11-22 ENCOUNTER — Telehealth: Payer: Self-pay | Admitting: *Deleted

## 2016-11-22 ENCOUNTER — Other Ambulatory Visit: Payer: Self-pay | Admitting: Internal Medicine

## 2016-11-22 ENCOUNTER — Telehealth: Payer: Self-pay | Admitting: Pharmacist

## 2016-11-22 DIAGNOSIS — M869 Osteomyelitis, unspecified: Secondary | ICD-10-CM

## 2016-11-22 NOTE — Telephone Encounter (Signed)
Per Dr. Linus Salmons, called Jake Dunn at [facility] and added vancomycin to patient's current ceftriaxone and extended both medications until 7/30.  Also added standard vancomycin labs. Jake Dunn verbalized understanding.

## 2016-11-22 NOTE — Telephone Encounter (Signed)
Patient's wife called back and she was informed of Dr. Henreitta Leber decision. She is having trouble with her land line phone and I notified [facility] of their cell phone numbers. Myrtis Hopping

## 2016-11-22 NOTE — Telephone Encounter (Signed)
Called patient and left a voice mail to return the call. Please see MD note. Myrtis Hopping

## 2016-11-22 NOTE — Telephone Encounter (Signed)
-----  Message from Thayer Headings, MD sent at 11/21/2016  4:40 PM EDT ----- His esr and crp have not improved much and he is having more pain. I think we should try IV vancomycin and see if that helps and extend for 2 weeks from now and see if his pain improves.  He is with [facility] thanks

## 2016-11-25 DIAGNOSIS — M19011 Primary osteoarthritis, right shoulder: Secondary | ICD-10-CM | POA: Diagnosis not present

## 2016-11-28 DIAGNOSIS — L02413 Cutaneous abscess of right upper limb: Secondary | ICD-10-CM | POA: Diagnosis not present

## 2016-11-28 DIAGNOSIS — M19011 Primary osteoarthritis, right shoulder: Secondary | ICD-10-CM | POA: Diagnosis not present

## 2016-11-28 DIAGNOSIS — Z452 Encounter for adjustment and management of vascular access device: Secondary | ICD-10-CM | POA: Diagnosis not present

## 2016-11-29 ENCOUNTER — Other Ambulatory Visit: Payer: Self-pay | Admitting: Pharmacist

## 2016-11-29 ENCOUNTER — Telehealth: Payer: Self-pay | Admitting: Pharmacist

## 2016-11-29 DIAGNOSIS — M19011 Primary osteoarthritis, right shoulder: Secondary | ICD-10-CM | POA: Diagnosis not present

## 2016-11-29 NOTE — Telephone Encounter (Signed)
Patient's wife called triage and talked to [place], [place] but phone call was transferred to me.  Patient's wife VERY upset. She is cussing at me and yelling/crying saying she is "fed up" with everything regarding antibiotics for her husband.  She states he had stitches removed on wound a few weeks ago and last week a "bubble" appeared on top of wound.  She went back to surgeon today who said that they thought it was scar tissue and not infection.  Patient's wife yelling at me stating she only has 2 vials of vancomycin and 7 or 8 of ceftriaxone and is wondering what is going on. Threatening that if he has to go back into hospital and have shoulder washed out again that it will be our fault. She then went on about the insurance rejection for penicillin and then Dr. Linus Salmons kept him on ceftriaxone instead.  I reassured her that ceftriaxone actually covers more bacteria than the penicillin (this is the 3rd time I have reassured her this).  Also told her that Dr. Linus Salmons added vancomycin when she complained the his pain wasn't getting any better which covers more bacteria. Also explained that I'm not sure why she is so upset as it sounds like the wound is getting better per what she told me from the surgeon today.  She started yelling again stating she did not know when the antibiotics were going to stop and I told her his stop date was 7/30. She then complained again about only having 2 vials of vancomycin.  I told her to call [facility] with that issue that we do not deal with shipping the antibiotics.  She proceeded to call me worthless and asked why triage transferred her to me. At that point, I told her I wasn't going to let her talk to me like this and cuss at me. She then proceeded to hang up on me.  I would prefer not to take calls from her anymore as this is the 2nd or 3rd time she has called/talked to me like this. Her husband sees our NP, Stephanie on 8/2. Will route to Dr. Linus Salmons and [place].

## 2016-11-29 NOTE — Telephone Encounter (Signed)
Last CRP 5.1 Sed Rate 27 from labs received on 11/23/16  And oh my... No you do not need to put up with that at all

## 2016-11-29 NOTE — Telephone Encounter (Signed)
No problem guys! Just wanted to give you all a heads up. Labs were also drawn yesterday.. CRP still in process, ESR 25.

## 2016-11-29 NOTE — Telephone Encounter (Signed)
Sorry to hear that Jake Dunn and you absolutely do not need to put up with that.  I will be stopping on 7/30 as planned and can have the picc line removed then.  As you mentioned, he has been well covered for his infection.    If you could let me know what his ESR and CRP are this week?    He will not follow up after 8/2 then and I suspect he won't come anyway.  If he does and are issues, we will fire him.    thanks

## 2016-12-01 DIAGNOSIS — M109 Gout, unspecified: Secondary | ICD-10-CM | POA: Diagnosis not present

## 2016-12-01 DIAGNOSIS — I10 Essential (primary) hypertension: Secondary | ICD-10-CM | POA: Diagnosis not present

## 2016-12-01 DIAGNOSIS — Z96619 Presence of unspecified artificial shoulder joint: Secondary | ICD-10-CM | POA: Diagnosis not present

## 2016-12-01 DIAGNOSIS — T8459XD Infection and inflammatory reaction due to other internal joint prosthesis, subsequent encounter: Secondary | ICD-10-CM | POA: Diagnosis not present

## 2016-12-01 DIAGNOSIS — G894 Chronic pain syndrome: Secondary | ICD-10-CM | POA: Diagnosis not present

## 2016-12-01 DIAGNOSIS — K219 Gastro-esophageal reflux disease without esophagitis: Secondary | ICD-10-CM | POA: Diagnosis not present

## 2016-12-01 DIAGNOSIS — F419 Anxiety disorder, unspecified: Secondary | ICD-10-CM | POA: Diagnosis not present

## 2016-12-01 DIAGNOSIS — Z8673 Personal history of transient ischemic attack (TIA), and cerebral infarction without residual deficits: Secondary | ICD-10-CM | POA: Diagnosis not present

## 2016-12-05 DIAGNOSIS — M19011 Primary osteoarthritis, right shoulder: Secondary | ICD-10-CM | POA: Diagnosis not present

## 2016-12-05 DIAGNOSIS — L02413 Cutaneous abscess of right upper limb: Secondary | ICD-10-CM | POA: Diagnosis not present

## 2016-12-05 DIAGNOSIS — Z452 Encounter for adjustment and management of vascular access device: Secondary | ICD-10-CM | POA: Diagnosis not present

## 2016-12-06 ENCOUNTER — Encounter: Payer: Self-pay | Admitting: Infectious Diseases

## 2016-12-06 ENCOUNTER — Telehealth: Payer: Self-pay | Admitting: *Deleted

## 2016-12-06 ENCOUNTER — Ambulatory Visit (INDEPENDENT_AMBULATORY_CARE_PROVIDER_SITE_OTHER): Payer: PPO | Admitting: Infectious Diseases

## 2016-12-06 ENCOUNTER — Telehealth: Payer: Self-pay | Admitting: Pharmacist

## 2016-12-06 ENCOUNTER — Telehealth: Payer: Self-pay | Admitting: Infectious Diseases

## 2016-12-06 VITALS — BP 159/79 | HR 63 | Temp 97.9°F | Wt 224.0 lb

## 2016-12-06 DIAGNOSIS — Z452 Encounter for adjustment and management of vascular access device: Secondary | ICD-10-CM

## 2016-12-06 DIAGNOSIS — L02413 Cutaneous abscess of right upper limb: Secondary | ICD-10-CM | POA: Diagnosis not present

## 2016-12-06 DIAGNOSIS — M19011 Primary osteoarthritis, right shoulder: Secondary | ICD-10-CM | POA: Diagnosis not present

## 2016-12-06 NOTE — Progress Notes (Addendum)
Patient Name: Jake Dunn  MRN: [id number]  DOB: [birth year]    Reason for Visit: FU on shoulder infection   Referring Provider: Dr. Maryla Morrow    SUBJECTIVE:  HPI: Jake Dunn is a 76 y.o. male patient of Dr. Linus Salmons currently undergoing treatment for infection of right shoulder. Previously he had reverse total shoulder replacement surgery in Nov 2017 for persistent pain/end-stage arthritis by Dr. Maryla Morrow. Was doing well after this replacement until late May/early June when he began having pain and swelling on the shoulder. I&D of Right Shoulder was done on 10/18/2016 - noted to have necrotic tissue, purulence but infection did not penetrate the joint. Cx showed few propionibacterium acnes. He has been receiving OPAT Ceftriaxone and Vancomycin (added 7/17 after he was experiencing more pain and mild elevation of ESR 33 and CRP 12.5) through yesterday. Antibiotics were extended x 2 weeks.  Last follow up was reportedly with Dr. Percell Miller a few weeks ago when a "bubble" appeared on the top of wound - thought to be scar tissue at that time and not infection. His wife called the triage line today and reported that there is a larger "pocket" on the right shoulder near the site of infection and it is now draining and redness is worse as of last night. Denies fevers but reports loss of appetite (no weight loss), decreased ROM from right shoulder and increasing pain (7/10 today at rest). He applied a dry gauze over the area to help collect drainage as it is continual.   Patient Active Problem List   Diagnosis Date Noted  . PICC (peripherally inserted central catheter) in place 11/15/2016  . Medication monitoring encounter 11/15/2016  . Abscess of right shoulder 10/18/2016  . Unstable angina ([place]) 08/15/2016  . Localized primary osteoarthritis of right shoulder region 04/06/2016  . Coughing/wheezing 08/17/2015  . Angioedema 08/17/2015  . Hypertension   . CAD (coronary artery disease)   . Stroke  ([place])   . Hyperlipidemia   . Dyspnea 08/07/2012  . Pre-operative clearance 06/01/2011    Patient's Medications  New Prescriptions   No medications on file  Previous Medications   ALLOPURINOL (ZYLOPRIM) 300 MG TABLET    Take 300 mg by mouth daily.   ASPIRIN EC 81 MG EC TABLET    Take 1 tablet (81 mg total) by mouth daily.   ATORVASTATIN (LIPITOR) 80 MG TABLET    Take 1 tablet (80 mg total) by mouth every evening.   CEFTRIAXONE (ROCEPHIN) 10 G INJECTION       CLOPIDOGREL (PLAVIX) 75 MG TABLET    Take 75 mg by mouth daily.   EPINEPHRINE 0.3 MG/0.3 ML IJ SOAJ INJECTION    USE AS DIRECTED IF LIFE THREATENING REACTION OCCURS   HYDROCHLOROTHIAZIDE (MICROZIDE) 12.5 MG CAPSULE    Take 1 capsule by mouth daily.   HYDROCODONE-ACETAMINOPHEN (NORCO/VICODIN) 5-325 MG TABLET    Take 1 tablet by mouth daily as needed.   ISOSORBIDE MONONITRATE (IMDUR) 60 MG 24 HR TABLET    Take 1 tablet (60 mg total) by mouth daily.   LORAZEPAM (ATIVAN) 0.5 MG TABLET    Take 0.5 mg by mouth 2 (two) times daily as needed for anxiety.    METOPROLOL TARTRATE (LOPRESSOR) 50 MG TABLET    Take 1 tablet (50 mg total) by mouth daily.   NITROGLYCERIN (NITROSTAT) 0.4 MG SL TABLET    Place 1 tablet (0.4 mg total) under the tongue every 5 (five) minutes x 3 doses as  needed for chest pain.   PANTOPRAZOLE (PROTONIX) 40 MG TABLET    Take 40 mg by mouth daily.   VALSARTAN (DIOVAN) 320 MG TABLET    Take 320 mg by mouth daily.  Modified Medications   No medications on file  Discontinued Medications   No medications on file    Review of Systems  Constitutional: Negative for chills and fever.  HENT: Negative for tinnitus.   Respiratory: Negative for cough and sputum production.   Cardiovascular: Negative for chest pain and leg swelling.  Gastrointestinal: Negative for abdominal pain, diarrhea and vomiting.  Genitourinary: Negative for dysuria.  Musculoskeletal: Positive for joint pain.  Skin:       "pocket" over right shoulder  wound that has been present x 2 weeks. Yellow clear drainage started yesterday.   Neurological: Negative for headaches.  Psychiatric/Behavioral: The patient is not nervous/anxious.     Past Medical History:  Diagnosis Date  . Adenomatous polyp   . Anxiety   . Arthritis    "knees, ankles, shoulders" (08/15/2016)  . CAD (coronary artery disease)    s/p cath in 2014 showing significant stenosis in the diagonal side branches and in the RV marginal branch. These vessels were small and diffusely diseased. He is managed medically. 08/16/16 Cath, no disease progression  . Chronic lower back pain   . Disc disease, degenerative, cervical   . GERD (gastroesophageal reflux disease)   . Gout   . Hyperlipidemia   . Hypertension   . Osteoarthritis   . Renal cell carcinoma    left kidney removal  . Stroke [facility])    MINI STROKE 15+ YRS AGO, no residual  . Stroke ([place])    "I've had 2 or 3"; denies residual on 08/15/2016  . Syncope and collapse     Social History  Substance Use Topics  . Smoking status: Former Smoker    Quit date: 05/31/1989  . Smokeless tobacco: Never Used     Comment: 08/15/2016 "hadn't smoked a carton of cigarettes all my life"  . Alcohol use 0.0 oz/week     Comment: 08/15/2016 "did have 6-8 beers per day, has not had any since end of October 2017    Allergies  Allergen Reactions  . Adhesive [Tape] Rash    Paper tape okay  . Latex Rash    OBJECTIVE: Vitals:   12/06/16 1346  BP: (!) 159/79  Pulse: 63  Temp: 97.9 F (36.6 C)  TempSrc: Oral  Weight: 224 lb (101.6 kg)   Body mass index is 35.08 kg/m.  Physical Exam  Constitutional: He is oriented to person, place, and time and well-developed, well-nourished, and in no distress.  HENT:  Mouth/Throat: No oral lesions. Normal dentition. No dental caries.  Eyes: No scleral icterus.  Cardiovascular: Normal rate, regular rhythm and normal heart sounds.   Pulmonary/Chest: Effort normal and breath sounds normal.    Abdominal: Soft. He exhibits no distension. There is no tenderness.  Musculoskeletal:       Right shoulder: He exhibits decreased range of motion and tenderness. He exhibits no swelling.  Lymphadenopathy:    He has no cervical adenopathy.  Neurological: He is alert and oriented to person, place, and time.  Skin: Skin is warm and dry. No rash noted.  Psychiatric: Mood and affect normal.  Vitals reviewed.         ASSESSMENT & PLAN:  Problem List Items Addressed This Visit      Other   PICC (peripherally inserted central catheter) in  place - Primary   Abscess of right shoulder    Nodule present on incision line now with what appears to be advancing erythema/warmth and serous drainage from pinhole site. I am uncertain as to whether there is true purulent material vs scar tissue inside based on my attempts to gently compress in the office and no abrupt evacuation of site. I do think a major component to this is scar tissue, however with increasing shoulder pain and now drainage it is worrisome considering he has been on antibiotics for almost 8 weeks now. ESR 25 (improved) CRP 6.7 (slightly elevated from prior). They have not called to inform Dr. Percell Miller of drainage. We called their office on behalf of the patient and they were able to see Mr. Patmon today. Shoulder joint itself without swelling/warmth but his ROM is limited, to which he repots worsening course. Will leave PICC in place for now until ortho re-evaluates site. Follow up in our office pending.          Janene Madeira, MSN, [facility] [place]  12/06/2016  3:16 PM

## 2016-12-06 NOTE — Assessment & Plan Note (Addendum)
Nodule present on incision line now with what appears to be advancing erythema/warmth and serous drainage from pinhole site. I am uncertain as to whether there is true purulent material vs scar tissue inside based on my attempts to gently compress in the office and no abrupt evacuation of site. I do think a major component to this is scar tissue, however with increasing shoulder pain and now drainage it is worrisome considering he has been on antibiotics for almost 8 weeks now. ESR 25 (improved) CRP 6.7 (slightly elevated from prior). They have not called to inform Dr. Percell Miller of drainage. We called their office on behalf of the patient and they were able to see Jake Dunn today. Shoulder joint itself without swelling/warmth but his ROM is limited, to which he repots worsening course. Will leave PICC in place for now until ortho re-evaluates site. Follow up in our office pending.

## 2016-12-06 NOTE — Telephone Encounter (Signed)
Received call from [facility] (Dr. D Murphy's PA) to further discuss Jake Dunn case. Likely he will need further surgical intervention with this persistent drainage despite appropriate and prolonged antibiotic therapy for propionibacterium. Discussed with Dr. Linus Salmons as well - will continue Vancomycin until Dr. Percell Miller can see him in the office in 1.5 weeks for re-evaluation of surgical plan.   Ideally would like to stop antibiotics 1 week before surgery to further culture hardware / tissue barring he is stable enough to do so.   I attempted to call Jake Dunn with this plan, however no option for voicemail set up on cell phone and home phone is busy. Will attempt another call tomorrow.   Jake Madeira, NP

## 2016-12-06 NOTE — Telephone Encounter (Signed)
Per Colletta Maryland, called Jeani Hawking at [facility] and gave verbal orders to continue patient's vancomycin until 8/14 (no ceftriaxone).  Jeani Hawking verbalized understanding.

## 2016-12-06 NOTE — Telephone Encounter (Signed)
Patient's wife, [name] called stating she is concerned because "a pocket" has developed on patient's right shoulder near the site of infection. He was seen by the surgeon and advised that it may possibly be scar tissue. She does not feel that is what it is because it is draining. She is requesting that patient be seen today. Appt scheduled with Abbey Chatters, NP today at 1:45 pm. Myrtis Hopping

## 2016-12-07 ENCOUNTER — Telehealth: Payer: Self-pay

## 2016-12-07 ENCOUNTER — Other Ambulatory Visit: Payer: Self-pay | Admitting: Pharmacist

## 2016-12-07 NOTE — Telephone Encounter (Signed)
Patient's wife is calling to follow up from visit on yesterday.  She has concerns of why the second antibiotic was discontinued.She states Colletta Maryland our nurse practitioner called her and spoke with patent on yesterday but he was not able to fully hear her instructions on the phone due to static.    I read the documented note to patient's wife regarding the visit and explained Colletta Maryland attempted to reach her and would be glad to speak with her if needed. She stated she was fine with the new information and the surgeon was planning repeat surgery. I extended the offer for Colletta Maryland, NP to return her call and she was fine with out a return call.     Laverle Patter, RN

## 2016-12-08 ENCOUNTER — Ambulatory Visit: Payer: PPO | Admitting: Infectious Diseases

## 2016-12-12 DIAGNOSIS — L02413 Cutaneous abscess of right upper limb: Secondary | ICD-10-CM | POA: Diagnosis not present

## 2016-12-12 DIAGNOSIS — Z452 Encounter for adjustment and management of vascular access device: Secondary | ICD-10-CM | POA: Diagnosis not present

## 2016-12-12 DIAGNOSIS — M19011 Primary osteoarthritis, right shoulder: Secondary | ICD-10-CM | POA: Diagnosis not present

## 2016-12-13 ENCOUNTER — Other Ambulatory Visit: Payer: Self-pay | Admitting: Pharmacist

## 2016-12-14 ENCOUNTER — Other Ambulatory Visit: Payer: Self-pay | Admitting: Pharmacist

## 2016-12-15 ENCOUNTER — Telehealth: Payer: Self-pay | Admitting: Infectious Diseases

## 2016-12-15 NOTE — Telephone Encounter (Signed)
I attempted to call Jake Dunn today to see how he was feeling and how the wound was doing. I would also like to know the plan going forward for possible shoulder hardware extraction. I extended out his Vancomycin x 2 weeks until he could get in to see Dr. Percell Miller.   Recent lab work reviewed from 8/1 & 8/7: Creatine 1.03 >> 1.23.  Trough 15.3 CRP 7.6 >> 11.2 Sed Rate 24 >> 29  I left a message on their home line and asked them to call back to update our team.   Janene Madeira, NP

## 2016-12-18 DIAGNOSIS — Z452 Encounter for adjustment and management of vascular access device: Secondary | ICD-10-CM | POA: Diagnosis not present

## 2016-12-18 DIAGNOSIS — M19011 Primary osteoarthritis, right shoulder: Secondary | ICD-10-CM | POA: Diagnosis not present

## 2016-12-18 DIAGNOSIS — L02413 Cutaneous abscess of right upper limb: Secondary | ICD-10-CM | POA: Diagnosis not present

## 2016-12-19 ENCOUNTER — Telehealth: Payer: Self-pay

## 2016-12-19 NOTE — Telephone Encounter (Signed)
Received surgical clearance from [facility].Dr.Jordan cleared patient for upcoming right shoulder surgery.Advised ok to stop Plavix 7 days prior to surgery.Advised to continue Aspirin.Form faxed back to fax # [phone].

## 2016-12-20 DIAGNOSIS — M19011 Primary osteoarthritis, right shoulder: Secondary | ICD-10-CM | POA: Diagnosis not present

## 2016-12-20 NOTE — H&P (Signed)
This is a pleasant 76 year-old gentleman who presents to our clinic today with concerns about his right shoulder.  We have been following Jake Dunn for a right shoulder superficial infection for the past almost two months.  He underwent an I&D to the right shoulder on October 18, 2016.  At that point there was no intraarticular pathology.  He was then placed on IV antibiotics via PIC line and has been followed by Dr. Linus Salmons for this.  He has been on Cephalexin, as well as Vancomycin.  We have only seen minimal improvement since starting the IV antibiotics.  We have been seeing him on an almost weekly basis where he has started to develop what we thought was granulation tissue to the mid aspect of his incision.  About three days ago he noticed some drainage and increased pain to the granulation tissue.  He was seen by ID yesterday and they referred him back over to [place] for further evaluation.  He has not had any fevers or chills, but does state the pain has increased over the past three days to what it was prior to surgical intervention on October 18, 2016.   Past medical, social and family history reviewed in detail on the patient questionnaire and signed.  Review of systems: As detailed in HPI.  All others reviewed and are negative.   EXAMINATION: Well-developed, well-nourished gentleman in no acute distress.  Alert and oriented x 3.  Examination of his right shoulder reveals a little erythema, nothing significant.  He does have some purulent drainage to the area of what we once thought was granulation tissue.  No tenseness.  No tenderness.  50% range of motion of the actual shoulder joint.    PLAN:  At this point we have not seen a dramatic improvement in the right direction following the surgical intervention and course of IV antibiotics.  We are going to have him continue his IV antibiotics, but have discussed the eventual need for explant of poly component and ball, followed by revision surgery.  I believe this is  the only way we will be able to get him to move in the right direction.  We are going to go ahead and fill out paperwork to proceed with this.  Risks, benefits and possible complications of surgery have been reviewed.  Rehab and recovery time discussed.  All questions answered.  Paperwork complete.

## 2016-12-21 ENCOUNTER — Telehealth: Payer: Self-pay | Admitting: *Deleted

## 2016-12-21 MED ORDER — VANCOMYCIN HCL 10 G IV SOLR
1500.0000 mg | INTRAVENOUS | Status: DC
  Filled 2016-12-21: qty 1500

## 2016-12-21 NOTE — Progress Notes (Signed)
Pre-op instructions called to patient's wife. No further questions. Pt wife states Dr. Martinique is aware of patients surgery and patient was told to stop Plavix last Wednesday (12/14/16)

## 2016-12-21 NOTE — Progress Notes (Signed)
Anesthesia Chart Review:  Pt is a same day work up.   Pt is a 76 year old male scheduled for R total shoulder revision on 12/22/2016 with Kathryne Hitch, MD  - PCP is Harlan Stains, MD - Cardiologist is Peter Martinique, MD who has cleared pt for surgery  PMH includes:  CAD (mod-severe disease 08/16/16-> medical management), stroke, HTN, syncope, hyperlipidemia, renal cell carcinoma (s/p L nephrectomy).  Former smoker. S/p R reverse shoulder arthroplasty 04/06/16.   Medications include: ASA 81 mg, Lipitor, Plavix, HCTZ, Imdur, metoprolol, Protonix, valsartan. Patient stopped Plavix 7 days before surgery.  Labs will be obtained DOS  CXR 08/15/16:  - Question nipple shadow on the left; advise repeat study with nipple markers to confirm.  - Mild scarring left base.  - No edema or consolidation.  - No adenopathy. There is aortic atherosclerosis as well as bilateral carotid artery calcification.  EKG 09/07/16: NSR  Nuclear stress test 08/23/16:  Nuclear stress EF: 51%. Mildly a synchronous contraction.  There was no ST segment deviation noted during stress. No perfusion defects at stress.  This is a low risk study. No evidence of ischemia identified.  Echo 08/17/16:  - Left ventricle: The cavity size was normal. There was mild concentric hypertrophy. Systolic function was normal. The estimated ejection fraction was in the range of 55% to 60%. Wall motion was normal; there were no regional wall motion abnormalities. Features are consistent with a pseudonormal left ventricular filling pattern, with concomitant abnormal relaxation and increased filling pressure (grade 2 diastolic dysfunction). Doppler parameters are consistent with indeterminate ventricular filling pressure. - Aortic valve: Transvalvular velocity was within the normal range. There was no stenosis. There was trivial regurgitation. - Mitral valve: Transvalvular velocity was within the normal range. There was no evidence for stenosis.  There was trivial regurgitation. - Left atrium: The atrium was severely dilated. - Right ventricle: The cavity size was normal. Wall thickness was normal. Systolic function was normal. - Atrial septum: No defect or patent foramen ovale was identified. - Tricuspid valve: There was trivial regurgitation. - Pulmonary arteries: Systolic pressure was within the normal range. PA peak pressure: 30 mm Hg (S).  Cardiac cath 08/16/16:  1. 3 vessel CAD with moderate calcified stenoses of the mid-LAD/second diagonal, ramus intermedius, ostial and proximal circumflex, and severe stenosis of the second PLA branch of the RCA 2. Normal LV systolic function and normal LVEDP - Recommend: Aggressive medical therapy. Outpatient exercise stress myoview. Consider TCTS evaluation for CABG if Myoview high-risk. Otherwise continue with medical therapy. - While the LAD/diag only show modest progression compared to 2010 and 2014 cath studies, the left circumflex/intermediate stenoses have progressed.  If labs acceptable DOS, I anticipate pt can proceed as scheduled.   Willeen Cass, FNP-BC [facility]/Anesthesiology Phone: [phone] 12/21/2016 3:44 PM

## 2016-12-21 NOTE — Telephone Encounter (Signed)
Patient wife called to advise that the patient saw his ortho today and is scheduled to have surgery tomorrow 12/22/16 and she just wanted to let Com[facility] know that is why he is not making an appt right away. Advised will let the doctor know.

## 2016-12-22 ENCOUNTER — Encounter (HOSPITAL_COMMUNITY)
Admission: RE | Disposition: A | Discharge status: Home / Self Care | Payer: Self-pay | Source: Ambulatory Visit | Attending: Orthopedic Surgery

## 2016-12-22 ENCOUNTER — Ambulatory Visit (HOSPITAL_COMMUNITY)
Admission: RE | Admit: 2016-12-22 | Discharge: 2016-12-24 | Disposition: A | Discharge status: Home / Self Care | Payer: PPO | Source: Ambulatory Visit | Attending: Orthopedic Surgery | Admitting: Orthopedic Surgery

## 2016-12-22 ENCOUNTER — Encounter (HOSPITAL_COMMUNITY): Payer: Self-pay | Admitting: *Deleted

## 2016-12-22 ENCOUNTER — Inpatient Hospital Stay (HOSPITAL_COMMUNITY): Payer: PPO | Admitting: Emergency Medicine

## 2016-12-22 ENCOUNTER — Other Ambulatory Visit: Payer: Self-pay | Admitting: Pharmacist

## 2016-12-22 DIAGNOSIS — Z96611 Presence of right artificial shoulder joint: Secondary | ICD-10-CM | POA: Diagnosis not present

## 2016-12-22 DIAGNOSIS — I2510 Atherosclerotic heart disease of native coronary artery without angina pectoris: Secondary | ICD-10-CM | POA: Insufficient documentation

## 2016-12-22 DIAGNOSIS — L928 Other granulomatous disorders of the skin and subcutaneous tissue: Secondary | ICD-10-CM | POA: Insufficient documentation

## 2016-12-22 DIAGNOSIS — Z85528 Personal history of other malignant neoplasm of kidney: Secondary | ICD-10-CM | POA: Insufficient documentation

## 2016-12-22 DIAGNOSIS — G8918 Other acute postprocedural pain: Secondary | ICD-10-CM | POA: Diagnosis not present

## 2016-12-22 DIAGNOSIS — T84098A Other mechanical complication of other internal joint prosthesis, initial encounter: Secondary | ICD-10-CM | POA: Diagnosis not present

## 2016-12-22 DIAGNOSIS — Z96619 Presence of unspecified artificial shoulder joint: Secondary | ICD-10-CM

## 2016-12-22 DIAGNOSIS — I10 Essential (primary) hypertension: Secondary | ICD-10-CM | POA: Diagnosis not present

## 2016-12-22 DIAGNOSIS — Y831 Surgical operation with implant of artificial internal device as the cause of abnormal reaction of the patient, or of later complication, without mention of misadventure at the time of the procedure: Secondary | ICD-10-CM | POA: Insufficient documentation

## 2016-12-22 DIAGNOSIS — Z87891 Personal history of nicotine dependence: Secondary | ICD-10-CM | POA: Diagnosis not present

## 2016-12-22 DIAGNOSIS — Z8673 Personal history of transient ischemic attack (TIA), and cerebral infarction without residual deficits: Secondary | ICD-10-CM | POA: Diagnosis not present

## 2016-12-22 DIAGNOSIS — T8459XA Infection and inflammatory reaction due to other internal joint prosthesis, initial encounter: Principal | ICD-10-CM | POA: Insufficient documentation

## 2016-12-22 DIAGNOSIS — I700 Atherosclerosis of aorta: Secondary | ICD-10-CM | POA: Insufficient documentation

## 2016-12-22 DIAGNOSIS — Z905 Acquired absence of kidney: Secondary | ICD-10-CM | POA: Diagnosis not present

## 2016-12-22 DIAGNOSIS — L02413 Cutaneous abscess of right upper limb: Secondary | ICD-10-CM | POA: Diagnosis present

## 2016-12-22 DIAGNOSIS — T849XXA Unspecified complication of internal orthopedic prosthetic device, implant and graft, initial encounter: Secondary | ICD-10-CM | POA: Diagnosis not present

## 2016-12-22 HISTORY — PX: IRRIGATION AND DEBRIDEMENT SHOULDER: SHX5880

## 2016-12-22 HISTORY — PX: TOTAL SHOULDER REVISION: SHX6130

## 2016-12-22 LAB — TYPE AND SCREEN
ABO/RH(D): A POS
Antibody Screen: NEGATIVE

## 2016-12-22 LAB — BASIC METABOLIC PANEL
Anion gap: 7 (ref 5–15)
BUN: 16 mg/dL (ref 6–20)
CO2: 26 mmol/L (ref 22–32)
Calcium: 9 mg/dL (ref 8.9–10.3)
Chloride: 105 mmol/L (ref 101–111)
Creatinine, Ser: 1.23 mg/dL (ref 0.61–1.24)
GFR calc Af Amer: >60 mL/min (ref >60)
GFR, EST NON AFRICAN AMERICAN: 56 mL/min — AB (ref >60)
GLUCOSE: 99 mg/dL (ref 65–99)
POTASSIUM: 4 mmol/L (ref 3.5–5.1)
Sodium: 138 mmol/L (ref 135–145)

## 2016-12-22 LAB — CBC
HCT: 33.4 % — ABNORMAL LOW (ref 39.0–52.0)
Hemoglobin: 10.8 g/dL — ABNORMAL LOW (ref 13.0–17.0)
MCH: 28.4 pg (ref 26.0–34.0)
MCHC: 32.3 g/dL (ref 30.0–36.0)
MCV: 87.9 fL (ref 78.0–100.0)
PLATELETS: 233 10*3/uL (ref 150–400)
RBC: 3.8 MIL/uL — AB (ref 4.22–5.81)
RDW: 13.7 % (ref 11.5–15.5)
WBC: 5.6 10*3/uL (ref 4.0–10.5)

## 2016-12-22 SURGERY — REVISION, TOTAL ARTHROPLASTY, SHOULDER
Anesthesia: General | Site: Shoulder | Laterality: Right

## 2016-12-22 MED ORDER — NITROGLYCERIN 0.4 MG SL SUBL: 0.4000 mg | SUBLINGUAL_TABLET | SUBLINGUAL | Status: DC | PRN

## 2016-12-22 MED ORDER — MIDAZOLAM HCL 2 MG/2ML IJ SOLN
INTRAMUSCULAR | Status: AC
  Administered 2016-12-22: 1 mg
  Filled 2016-12-22: qty 2

## 2016-12-22 MED ORDER — CHLORHEXIDINE GLUCONATE 4 % EX LIQD: 60.0000 mL | Freq: Once | CUTANEOUS | Status: DC

## 2016-12-22 MED ORDER — PHENOL 1.4 % MT LIQD: 1.0000 | OROMUCOSAL | Status: DC | PRN

## 2016-12-22 MED ORDER — HYDROMORPHONE HCL 1 MG/ML IJ SOLN
0.5000 mg | INTRAMUSCULAR | Status: DC | PRN
  Administered 2016-12-22 – 2016-12-23 (×2): 0.5 mg via INTRAVENOUS
  Filled 2016-12-22 (×2): qty 1

## 2016-12-22 MED ORDER — FENTANYL CITRATE (PF) 100 MCG/2ML IJ SOLN: 25.0000 ug | INTRAMUSCULAR | Status: DC | PRN

## 2016-12-22 MED ORDER — LIDOCAINE 2% (20 MG/ML) 5 ML SYRINGE
INTRAMUSCULAR | Status: DC | PRN
  Administered 2016-12-22: 60 mg via INTRAVENOUS

## 2016-12-22 MED ORDER — PHENYLEPHRINE HCL 10 MG/ML IJ SOLN
INTRAMUSCULAR | Status: AC
  Filled 2016-12-22: qty 1

## 2016-12-22 MED ORDER — SODIUM CHLORIDE 0.9 % IR SOLN
Status: DC | PRN
  Administered 2016-12-22: 1000 mL

## 2016-12-22 MED ORDER — DEXAMETHASONE SODIUM PHOSPHATE 10 MG/ML IJ SOLN
INTRAMUSCULAR | Status: DC | PRN
  Administered 2016-12-22: 5 mg via INTRAVENOUS

## 2016-12-22 MED ORDER — OXYCODONE HCL 5 MG PO TABS
5.0000 mg | ORAL_TABLET | ORAL | Status: DC | PRN
  Administered 2016-12-22 (×2): 10 mg via ORAL
  Administered 2016-12-23: 5 mg via ORAL
  Administered 2016-12-23 (×2): 10 mg via ORAL
  Administered 2016-12-23 (×2): 5 mg via ORAL
  Administered 2016-12-24: 10 mg via ORAL
  Filled 2016-12-22 (×2): qty 2
  Filled 2016-12-22: qty 1
  Filled 2016-12-22 (×3): qty 2
  Filled 2016-12-22 (×2): qty 1

## 2016-12-22 MED ORDER — METOCLOPRAMIDE HCL 5 MG PO TABS: 5.0000 mg | ORAL_TABLET | Freq: Three times a day (TID) | ORAL | Status: DC | PRN

## 2016-12-22 MED ORDER — POTASSIUM CHLORIDE IN NACL 20-0.9 MEQ/L-% IV SOLN
INTRAVENOUS | Status: DC
  Administered 2016-12-22 – 2016-12-23 (×2): via INTRAVENOUS
  Filled 2016-12-22 (×2): qty 1000

## 2016-12-22 MED ORDER — ONDANSETRON HCL 4 MG/2ML IJ SOLN: 4.0000 mg | Freq: Four times a day (QID) | INTRAMUSCULAR | Status: DC | PRN

## 2016-12-22 MED ORDER — DEXTROSE 5 % IV SOLN
INTRAVENOUS | Status: DC | PRN
  Administered 2016-12-22: 40 ug/min via INTRAVENOUS

## 2016-12-22 MED ORDER — EPHEDRINE 5 MG/ML INJ
INTRAVENOUS | Status: AC
  Filled 2016-12-22: qty 10

## 2016-12-22 MED ORDER — ONDANSETRON HCL 4 MG/2ML IJ SOLN
INTRAMUSCULAR | Status: DC | PRN
  Administered 2016-12-22: 4 mg via INTRAVENOUS

## 2016-12-22 MED ORDER — HYDROCHLOROTHIAZIDE 12.5 MG PO CAPS
12.5000 mg | ORAL_CAPSULE | Freq: Every evening | ORAL | Status: DC
  Administered 2016-12-23: 12.5 mg via ORAL
  Filled 2016-12-22: qty 1

## 2016-12-22 MED ORDER — FENTANYL CITRATE (PF) 250 MCG/5ML IJ SOLN
INTRAMUSCULAR | Status: AC
  Filled 2016-12-22: qty 5

## 2016-12-22 MED ORDER — SODIUM CHLORIDE 0.9 % IR SOLN
Status: DC | PRN
  Administered 2016-12-22: 3000 mL

## 2016-12-22 MED ORDER — EPHEDRINE SULFATE 50 MG/ML IJ SOLN
INTRAMUSCULAR | Status: DC | PRN
  Administered 2016-12-22: 10 mg via INTRAVENOUS
  Administered 2016-12-22: 5 mg via INTRAVENOUS

## 2016-12-22 MED ORDER — LIDOCAINE 2% (20 MG/ML) 5 ML SYRINGE
INTRAMUSCULAR | Status: AC
  Filled 2016-12-22: qty 5

## 2016-12-22 MED ORDER — PROPOFOL 10 MG/ML IV BOLUS
INTRAVENOUS | Status: DC | PRN
  Administered 2016-12-22: 140 mg via INTRAVENOUS

## 2016-12-22 MED ORDER — PROPOFOL 10 MG/ML IV BOLUS
INTRAVENOUS | Status: AC
  Filled 2016-12-22: qty 20

## 2016-12-22 MED ORDER — PANTOPRAZOLE SODIUM 40 MG PO TBEC
40.0000 mg | DELAYED_RELEASE_TABLET | Freq: Every evening | ORAL | Status: DC
  Administered 2016-12-22 – 2016-12-23 (×2): 40 mg via ORAL
  Filled 2016-12-22 (×2): qty 1

## 2016-12-22 MED ORDER — SUGAMMADEX SODIUM 500 MG/5ML IV SOLN
INTRAVENOUS | Status: DC | PRN
  Administered 2016-12-22: 400 mg via INTRAVENOUS

## 2016-12-22 MED ORDER — ONDANSETRON HCL 4 MG/2ML IJ SOLN
INTRAMUSCULAR | Status: AC
  Filled 2016-12-22: qty 2

## 2016-12-22 MED ORDER — VANCOMYCIN HCL 1000 MG IV SOLR
INTRAVENOUS | Status: DC | PRN
  Administered 2016-12-22: 1500 mg via INTRAVENOUS

## 2016-12-22 MED ORDER — LACTATED RINGERS IV SOLN
INTRAVENOUS | Status: DC
  Administered 2016-12-22: 12:00:00 via INTRAVENOUS

## 2016-12-22 MED ORDER — METOPROLOL TARTRATE 50 MG PO TABS
50.0000 mg | ORAL_TABLET | Freq: Every evening | ORAL | Status: DC
  Administered 2016-12-22 – 2016-12-23 (×2): 50 mg via ORAL
  Filled 2016-12-22 (×2): qty 1

## 2016-12-22 MED ORDER — PHENYLEPHRINE 40 MCG/ML (10ML) SYRINGE FOR IV PUSH (FOR BLOOD PRESSURE SUPPORT)
PREFILLED_SYRINGE | INTRAVENOUS | Status: DC | PRN
Start: 2016-12-22 — End: 2016-12-22
  Administered 2016-12-22: 120 ug via INTRAVENOUS
  Administered 2016-12-22: 40 ug via INTRAVENOUS
  Administered 2016-12-22: 80 ug via INTRAVENOUS
  Administered 2016-12-22: 120 ug via INTRAVENOUS

## 2016-12-22 MED ORDER — MIDAZOLAM HCL 2 MG/2ML IJ SOLN
INTRAMUSCULAR | Status: AC
  Filled 2016-12-22: qty 2

## 2016-12-22 MED ORDER — OXYCODONE HCL 5 MG/5ML PO SOLN: 5.0000 mg | Freq: Once | ORAL | Status: DC | PRN

## 2016-12-22 MED ORDER — ROCURONIUM BROMIDE 10 MG/ML (PF) SYRINGE
PREFILLED_SYRINGE | INTRAVENOUS | Status: AC
  Filled 2016-12-22: qty 5

## 2016-12-22 MED ORDER — METOCLOPRAMIDE HCL 5 MG/ML IJ SOLN: 5.0000 mg | Freq: Three times a day (TID) | INTRAMUSCULAR | Status: DC | PRN

## 2016-12-22 MED ORDER — ACETAMINOPHEN 650 MG RE SUPP: 650.0000 mg | Freq: Four times a day (QID) | RECTAL | Status: DC | PRN

## 2016-12-22 MED ORDER — ONDANSETRON HCL 4 MG PO TABS: 4.0000 mg | ORAL_TABLET | Freq: Four times a day (QID) | ORAL | Status: DC | PRN

## 2016-12-22 MED ORDER — ASPIRIN EC 81 MG PO TBEC
81.0000 mg | DELAYED_RELEASE_TABLET | Freq: Every evening | ORAL | Status: DC
  Administered 2016-12-23: 81 mg via ORAL
  Filled 2016-12-22: qty 1

## 2016-12-22 MED ORDER — FENTANYL CITRATE (PF) 100 MCG/2ML IJ SOLN
INTRAMUSCULAR | Status: AC
Start: 2016-12-22 — End: 2016-12-22
  Administered 2016-12-22: 50 ug
  Filled 2016-12-22: qty 2

## 2016-12-22 MED ORDER — ROCURONIUM BROMIDE 10 MG/ML (PF) SYRINGE
PREFILLED_SYRINGE | INTRAVENOUS | Status: DC | PRN
  Administered 2016-12-22: 50 mg via INTRAVENOUS
  Administered 2016-12-22: 20 mg via INTRAVENOUS

## 2016-12-22 MED ORDER — ACETAMINOPHEN 325 MG PO TABS
650.0000 mg | ORAL_TABLET | Freq: Four times a day (QID) | ORAL | Status: DC | PRN
  Administered 2016-12-23: 650 mg via ORAL
  Filled 2016-12-22: qty 2

## 2016-12-22 MED ORDER — SUGAMMADEX SODIUM 500 MG/5ML IV SOLN
INTRAVENOUS | Status: AC
  Filled 2016-12-22: qty 5

## 2016-12-22 MED ORDER — BUPIVACAINE-EPINEPHRINE (PF) 0.5% -1:200000 IJ SOLN
INTRAMUSCULAR | Status: AC
  Filled 2016-12-22: qty 30

## 2016-12-22 MED ORDER — CLOPIDOGREL BISULFATE 75 MG PO TABS
75.0000 mg | ORAL_TABLET | Freq: Every evening | ORAL | Status: DC
Start: 2016-12-23 — End: 2016-12-24
  Administered 2016-12-23: 75 mg via ORAL
  Filled 2016-12-22: qty 1

## 2016-12-22 MED ORDER — DEXTROSE 5 % IV SOLN
500.0000 mg | Freq: Four times a day (QID) | INTRAVENOUS | Status: DC | PRN
  Filled 2016-12-22: qty 5

## 2016-12-22 MED ORDER — ISOSORBIDE MONONITRATE ER 60 MG PO TB24
60.0000 mg | ORAL_TABLET | Freq: Every evening | ORAL | Status: DC
  Administered 2016-12-22 – 2016-12-23 (×2): 60 mg via ORAL
  Filled 2016-12-22 (×2): qty 1

## 2016-12-22 MED ORDER — PHENYLEPHRINE 40 MCG/ML (10ML) SYRINGE FOR IV PUSH (FOR BLOOD PRESSURE SUPPORT)
PREFILLED_SYRINGE | INTRAVENOUS | Status: AC
  Filled 2016-12-22: qty 10

## 2016-12-22 MED ORDER — DEXAMETHASONE SODIUM PHOSPHATE 10 MG/ML IJ SOLN
INTRAMUSCULAR | Status: AC
  Filled 2016-12-22: qty 1

## 2016-12-22 MED ORDER — BUPIVACAINE-EPINEPHRINE (PF) 0.5% -1:200000 IJ SOLN
INTRAMUSCULAR | Status: DC | PRN
  Administered 2016-12-22: 30 mL via PERINEURAL

## 2016-12-22 MED ORDER — ATORVASTATIN CALCIUM 80 MG PO TABS
80.0000 mg | ORAL_TABLET | Freq: Every evening | ORAL | Status: DC
  Administered 2016-12-22 – 2016-12-23 (×2): 80 mg via ORAL
  Filled 2016-12-22 (×2): qty 1

## 2016-12-22 MED ORDER — MENTHOL 3 MG MT LOZG: 1.0000 | LOZENGE | OROMUCOSAL | Status: DC | PRN

## 2016-12-22 MED ORDER — IRBESARTAN 300 MG PO TABS
300.0000 mg | ORAL_TABLET | Freq: Every day | ORAL | Status: DC
  Administered 2016-12-22 – 2016-12-23 (×2): 300 mg via ORAL
  Filled 2016-12-22 (×2): qty 1

## 2016-12-22 MED ORDER — METHOCARBAMOL 500 MG PO TABS
500.0000 mg | ORAL_TABLET | Freq: Four times a day (QID) | ORAL | Status: DC | PRN
  Administered 2016-12-22 – 2016-12-24 (×5): 500 mg via ORAL
  Filled 2016-12-22 (×5): qty 1

## 2016-12-22 MED ORDER — OXYCODONE HCL 5 MG PO TABS: 5.0000 mg | ORAL_TABLET | Freq: Once | ORAL | Status: DC | PRN

## 2016-12-22 SURGICAL SUPPLY — 73 items
APL SKNCLS STERI-STRIP NONHPOA (GAUZE/BANDAGES/DRESSINGS) ×1
BENZOIN TINCTURE PRP APPL 2/3 (GAUZE/BANDAGES/DRESSINGS) ×2 IMPLANT
BLADE SAW SGTL 83.5X18.5 (BLADE) ×1 IMPLANT
BNDG GAUZE ELAST 4 BULKY (GAUZE/BANDAGES/DRESSINGS) ×1 IMPLANT
BOWL SMART MIX CTS (DISPOSABLE) IMPLANT
BRUSH FEMORAL CANAL (MISCELLANEOUS) IMPLANT
BUR SURG 4X8 MED (BURR) IMPLANT
BURR SURG 4X8 MED (BURR)
CONT SPEC 4OZ CLIKSEAL STRL BL (MISCELLANEOUS) ×1 IMPLANT
COVER SURGICAL LIGHT HANDLE (MISCELLANEOUS) ×2 IMPLANT
CUP HUMERAL REUNION RSA 36X4 (Shoulder) ×1 IMPLANT
DRAPE IMP U-DRAPE 54X76 (DRAPES) ×2 IMPLANT
DRAPE ORTHO SPLIT 77X108 STRL (DRAPES) ×4
DRAPE SURG ORHT 6 SPLT 77X108 (DRAPES) ×2 IMPLANT
DRAPE U-SHAPE 47X51 STRL (DRAPES) ×2 IMPLANT
DRSG AQUACEL AG ADV 3.5X10 (GAUZE/BANDAGES/DRESSINGS) ×1 IMPLANT
DURAPREP 26ML APPLICATOR (WOUND CARE) ×2 IMPLANT
ELECT CAUTERY BLADE 6.4 (BLADE) ×3 IMPLANT
ELECT REM PT RETURN 9FT ADLT (ELECTROSURGICAL) ×2
ELECTRODE REM PT RTRN 9FT ADLT (ELECTROSURGICAL) ×1 IMPLANT
EVACUATOR 1/8 PVC DRAIN (DRAIN) IMPLANT
FACESHIELD WRAPAROUND (MASK) ×4 IMPLANT
FACESHIELD WRAPAROUND OR TEAM (MASK) ×2 IMPLANT
GAUZE SPONGE 4X4 12PLY STRL LF (GAUZE/BANDAGES/DRESSINGS) ×1 IMPLANT
GAUZE XEROFORM 5X9 LF (GAUZE/BANDAGES/DRESSINGS) ×1 IMPLANT
GLENOSPHERE REUNION RSA 36X2 (Shoulder) ×1 IMPLANT
GLOVE BIOGEL PI IND STRL 7.0 (GLOVE) ×1 IMPLANT
GLOVE BIOGEL PI INDICATOR 7.0 (GLOVE) ×1
GLOVE ECLIPSE 7.0 STRL STRAW (GLOVE) ×1 IMPLANT
GLOVE ORTHO TXT STRL SZ7.5 (GLOVE) ×1 IMPLANT
GLOVE SURG SS PI 7.0 STRL IVOR (GLOVE) ×2 IMPLANT
GOWN STRL REUS W/ TWL LRG LVL3 (GOWN DISPOSABLE) ×2 IMPLANT
GOWN STRL REUS W/TWL LRG LVL3 (GOWN DISPOSABLE) ×4
HANDPIECE INTERPULSE COAX TIP (DISPOSABLE)
INSERT HUMERL REUNION RSA 36X4 (Shoulder) ×2 IMPLANT
KIT BASIN OR (CUSTOM PROCEDURE TRAY) ×2 IMPLANT
KIT ROOM TURNOVER OR (KITS) ×2 IMPLANT
MANIFOLD NEPTUNE II (INSTRUMENTS) ×2 IMPLANT
NDL HYPO 25GX1X1/2 BEV (NEEDLE) ×1 IMPLANT
NEEDLE HYPO 25GX1X1/2 BEV (NEEDLE) ×2 IMPLANT
NS IRRIG 1000ML POUR BTL (IV SOLUTION) ×2 IMPLANT
PACK SHOULDER (CUSTOM PROCEDURE TRAY) ×2 IMPLANT
PAD ABD 8X10 STRL (GAUZE/BANDAGES/DRESSINGS) ×1 IMPLANT
PAD ARMBOARD 7.5X6 YLW CONV (MISCELLANEOUS) ×4 IMPLANT
SET HNDPC FAN SPRY TIP SCT (DISPOSABLE) IMPLANT
SLING ARM FOAM STRAP XLG (SOFTGOODS) ×1 IMPLANT
SLING ARM IMMOBILIZER LRG (SOFTGOODS) ×1 IMPLANT
SLING ARM IMMOBILIZER XL (CAST SUPPLIES) IMPLANT
SPONGE LAP 18X18 X RAY DECT (DISPOSABLE) ×2 IMPLANT
STRIP CLOSURE SKIN 1/2X4 (GAUZE/BANDAGES/DRESSINGS) ×2 IMPLANT
SUCTION FRAZIER HANDLE 10FR (MISCELLANEOUS) ×1
SUCTION TUBE FRAZIER 10FR DISP (MISCELLANEOUS) ×1 IMPLANT
SUT ETHILON 3 0 PS 1 (SUTURE) ×3 IMPLANT
SUT FIBERWIRE #2 38 REV NDL BL (SUTURE) ×4
SUT MNCRL AB 4-0 PS2 18 (SUTURE) ×2 IMPLANT
SUT MON AB 2-0 CT1 36 (SUTURE) ×2 IMPLANT
SUT VIC AB 0 CT1 27 (SUTURE) ×2
SUT VIC AB 0 CT1 27XBRD ANBCTR (SUTURE) ×1 IMPLANT
SUT VIC AB 1 CT1 27 (SUTURE) ×2
SUT VIC AB 1 CT1 27XBRD ANBCTR (SUTURE) IMPLANT
SUT VIC AB 1 CTX 27 (SUTURE) ×2 IMPLANT
SUT VIC AB 1 CTX 36 (SUTURE) ×2
SUT VIC AB 1 CTX36XBRD ANBCTR (SUTURE) IMPLANT
SUT VIC AB 2-0 CT1 27 (SUTURE) ×2
SUT VIC AB 2-0 CT1 TAPERPNT 27 (SUTURE) ×1 IMPLANT
SUTURE FIBERWR#2 38 REV NDL BL (SUTURE) ×2 IMPLANT
SWAB COLLECTION DEVICE MRSA (MISCELLANEOUS) ×2 IMPLANT
SWAB CULTURE ESWAB REG 1ML (MISCELLANEOUS) ×2 IMPLANT
SYR CONTROL 10ML LL (SYRINGE) ×2 IMPLANT
TAPE PAPER MEDFIX 1IN X 10YD (GAUZE/BANDAGES/DRESSINGS) ×1 IMPLANT
TOWEL OR 17X24 6PK STRL BLUE (TOWEL DISPOSABLE) ×2 IMPLANT
TOWEL OR 17X26 10 PK STRL BLUE (TOWEL DISPOSABLE) ×2 IMPLANT
WATER STERILE IRR 1000ML POUR (IV SOLUTION) ×2 IMPLANT

## 2016-12-22 NOTE — Transfer of Care (Signed)
Immediate Anesthesia Transfer of Care Note  Patient: Jake Dunn  Procedure(s) Performed: Procedure(s): RIGHT SHOULDER GLENOID AND HUMERAL COMPONENT REVISION (Right) IRRIGATION AND DEBRIDEMENT RIGHT SHOULDER (Right)  Patient Location: PACU  Anesthesia Type:GA combined with regional for post-op pain  Level of Consciousness: awake, oriented and patient cooperative  Airway & Oxygen Therapy: Patient Spontanous Breathing and Patient connected to nasal cannula oxygen  Post-op Assessment: Report given to RN, Post -op Vital signs reviewed and stable and Patient moving all extremities  Post vital signs: Reviewed and stable  Last Vitals:  Vitals:   12/22/16 1031  BP: 126/68  Pulse: (!) 58  Resp: 18  Temp: 36.5 C  SpO2: 100%    Last Pain:  Vitals:   12/22/16 1149  TempSrc:   PainSc: 7       Patients Stated Pain Goal: 6 (09/98/33 8250)  Complications: No apparent anesthesia complications

## 2016-12-22 NOTE — Telephone Encounter (Signed)
Received call from [facility] Dr. Debroah Loop PA - taking Jake Dunn to the operating room today after failed I&D + IV antibiotics for shoulder abscess. Planning poly-exchange this afternoon at [facility] 12:45.   Requesting assistance for IV antibiotics after surgery. Last dose of Vancomycin was 8/14 - discussed sending tissue + hardware for culture vs swabs for higher yield.   Original culture with Prop acnes - was treated with Ceftriaxone (insurance difficulty for continuous PCN). Vancomycin was later added d/t increased pain and inflammatory markers; later developed nodular scar over incision site which started draining.   I have informed Dr. Megan Salon of his admission / surgery as he is covering [facility] this week.   Janene Madeira, NP

## 2016-12-22 NOTE — Telephone Encounter (Signed)
Thanks

## 2016-12-22 NOTE — Consult Note (Signed)
         [facility] for Infectious Disease    Date of Admission:  12/22/2016     [name] was hospitalized 2 months ago and underwent incision and drainage of his right shoulder. An abscess was encountered but there is no evidence that infection entered the joint capsule. He had previous right shoulder replacement. Abscess cultures grew Propionibacterium. He has been treated with IV ceftriaxone. In postop follow-up with my partner, Dr. Linus Dunn, it was noted that [name] was having increased right shoulder pain. He then developed a draining lesion and vancomycin was added to his regimen. He was readmitted today and is currently in the operating room for incision and drainage of the shoulder and consideration of poly-exchange. We will follow-up tomorrow and review operative findings, Gram stain and cultures and leave recommendations about ongoing antibiotic therapy.         Michel Bickers, MD [facility] for [place] [facility] [phone] pager   [phone] cell 12/22/2016, 4:35 PM

## 2016-12-22 NOTE — Anesthesia Preprocedure Evaluation (Signed)
Anesthesia Evaluation  Patient identified by MRN, date of birth, ID band Patient awake    Reviewed: Allergy & Precautions, H&P , NPO status , Patient's Chart, lab work & pertinent test results  Airway Mallampati: II   Neck ROM: full    Dental   Pulmonary former smoker,    breath sounds clear to auscultation       Cardiovascular hypertension, + angina + CAD   Rhythm:regular Rate:Normal  CAD medically managed.  He stopped his plavix 8 days ago as instructed.   Neuro/Psych Anxiety CVA    GI/Hepatic GERD  ,  Endo/Other    Renal/GU      Musculoskeletal  (+) Arthritis ,   Abdominal   Peds  Hematology   Anesthesia Other Findings   Reproductive/Obstetrics                             Anesthesia Physical Anesthesia Plan  ASA: III  Anesthesia Plan: General   Post-op Pain Management:  Regional for Post-op pain   Induction: Intravenous  PONV Risk Score and Plan: 2 and Ondansetron, Dexamethasone, Treatment may vary due to age or medical condition and Midazolam  Airway Management Planned: Oral ETT  Additional Equipment:   Intra-op Plan:   Post-operative Plan: Extubation in OR  Informed Consent: I have reviewed the patients History and Physical, chart, labs and discussed the procedure including the risks, benefits and alternatives for the proposed anesthesia with the patient or authorized representative who has indicated his/her understanding and acceptance.     Plan Discussed with: CRNA, Anesthesiologist and Surgeon  Anesthesia Plan Comments:         Anesthesia Quick Evaluation

## 2016-12-22 NOTE — Progress Notes (Signed)
[facility] has provided Home Infusion Pharm[facility] services for home IV ABX in the recent past.  [facility] Coordinator will follow Mr Heckmann during this hospital stay to support home IV [facility] services at [place] if needed.   If patient discharges after hours, please call [phone].   Jake Dunn 12/22/2016, 10:12 PM

## 2016-12-22 NOTE — Consult Note (Signed)
[facility] for Infectious Disease    Date of Admission:  12/22/2016   Total days of antibiotics 0               Reason for Consult: Prosthetic shoulder infection   Referring Provider: Dr. Percell Miller  Assessment and Plan:  Abscess of right shoulder Prosthetic shoulder infection [facility]) The patient underwent a right total shoulder revision 8/16. Operative Gram stain results have shown gram positive cocci in pairs. Cultures are pending. I suspect that his failure to improve was related to infected prosthetic hardware rather than insufficient or incorrect antibiotic therapy. I will have him restart IV vancomycin pending final culture results.   Plan: 1. Restart IV vancomycin pending final culture results 2. I will arrange follow-up in our clinic  Diagnosis: Right prosthetic shoulder infection  Culture Result: Pending  Allergies  Allergen Reactions  . Adhesive [Tape] Rash    Paper tape okay  . Latex Rash    OPAT Orders Discharge antibiotics: Per pharmacy protocol Vancomycin Aim for Vancomycin trough 15-20 (unless otherwise indicated) Duration: 6 weeks End Date: 02/03/2017  [facility] Care Per Protocol:  Labs weekly while on IV antibiotics: _x_ CBC with differential _x_ BMP __ CMP _x_ CRP _x_ ESR _x_ Vancomycin trough  __ Please pull PIC at completion of IV antibiotics _x_ Please leave PIC in place until doctor has seen patient or been notified  Fax weekly labs to [phone]  Clinic Follow Up Appt: I will arrange follow-up in our clinic in 4-6 weeks      . [START ON 12/23/2016] aspirin EC  81 mg Oral QPM  . atorvastatin  80 mg Oral QPM  . [START ON 12/23/2016] clopidogrel  75 mg Oral QPM  . [START ON 12/23/2016] hydrochlorothiazide  12.5 mg Oral QPM  . irbesartan  300 mg Oral q1800  . isosorbide mononitrate  60 mg Oral QPM  . metoprolol tartrate  50 mg Oral QPM  . pantoprazole  40 mg Oral QPM    HPI: Jake Dunn is a 76 y.o. male  with pmh of  cad, htn, and right shoulder replacement in 03/2016 for end-stage arthritis presented with right shoulder pain. The patient was seen at [facility] previously on 10/18/16 with complaint of right shoulder pain for which a I&D was performed. Necrotic tissue and purulence, but the infection did not penetrate the joint. He was started on empiric vancomycin and ceftriaxone after the surgery and after cultures were sent. Cultures resulted for porpionibacterium acnes. The patient was seen outpatient by Dr. Linus Salmons on 10/24/16 at which time the vancomycin and ceftriaxone was stopped and he was started on continuous infusion of penicillin. Penicillin is unfortunately not on formulary and needs prior authorization and therefore Dr. Linus Salmons opted to continue ceftriaxone (with opat assistance) rather than start penicillin. At follow up appointment on 11/15/16 the patient's esr/crp was stable and he noted some serosanguinous drainage but no pus. The patient called the ID office on 7/16 and mentioned that he has been continuing to have pain and therefore iv vancomycin was added per picc line with current ceftriaxone and treatment duration of both medication was extended till 12/05/16. Follow up visit on 12/06/16 noted mild elevation of ESR 33 and CRP 12.5, larger "pocket" on right shoulder with redness and decreased range of motion. Therefore, antibiotics were extended 2 more weeks till 12/20/16. Decision was made by Dr. Percell Miller for a poly-exchange due to failed I&D and IV antibiotics. The patient's  last dose of vancomycin was on 12/20/2016. Patient was admitted to [facility] and a right total shoulder revision was done yesterday 8/16.    Review of Systems: ROS as above  Past Medical History:  Diagnosis Date  . Adenomatous polyp   . Anxiety   . Arthritis    "knees, ankles, shoulders" (08/15/2016)  . CAD (coronary artery disease)    s/p cath in 2014 showing significant stenosis in the diagonal side branches and in the RV marginal branch. These  vessels were small and diffusely diseased. He is managed medically. 08/16/16 Cath, no disease progression  . Chronic lower back pain   . Disc disease, degenerative, cervical   . GERD (gastroesophageal reflux disease)   . Gout   . Hyperlipidemia   . Hypertension   . Osteoarthritis   . Renal cell carcinoma    left kidney removal  . Stroke [facility])    MINI STROKE 15+ YRS AGO, no residual  . Stroke ([place])    "I've had 2 or 3"; denies residual on 08/15/2016  . Syncope and collapse     Social History  Substance Use Topics  . Smoking status: Former Smoker    Quit date: 05/31/1989  . Smokeless tobacco: Never Used     Comment: 08/15/2016 "hadn't smoked a carton of cigarettes all my life"  . Alcohol use 0.0 oz/week     Comment: 08/15/2016 "did have 6-8 beers per day, has not had any since end of October 2017    Family History  Problem Relation Age of Onset  . Heart attack Father   . Heart disease Brother   . Allergic rhinitis Neg Hx   . Angioedema Neg Hx   . Asthma Neg Hx   . Atopy Neg Hx   . Eczema Neg Hx   . Immunodeficiency Neg Hx   . Urticaria Neg Hx    Allergies  Allergen Reactions  . Adhesive [Tape] Rash    Paper tape okay  . Latex Rash    OBJECTIVE: Blood pressure 125/72, pulse 71, temperature 97.7 F (36.5 C), resp. rate 17, height '5\' 8"'  (1.727 m), weight 223 lb (101.2 kg), SpO2 96 %.  Physical Exam  Lab Results Lab Results  Component Value Date   WBC 5.6 12/22/2016   HGB 10.8 (L) 12/22/2016   HCT 33.4 (L) 12/22/2016   MCV 87.9 12/22/2016   PLT 233 12/22/2016    Lab Results  Component Value Date   CREATININE 1.23 12/22/2016   BUN 16 12/22/2016   NA 138 12/22/2016   K 4.0 12/22/2016   CL 105 12/22/2016   CO2 26 12/22/2016    Lab Results  Component Value Date   ALT 15 (L) 03/25/2016   AST 20 03/25/2016   ALKPHOS 70 03/25/2016   BILITOT 0.7 03/25/2016     Microbiology: Recent Results (from the past 240 hour(s))  Aerobic/Anaerobic Culture (surgical/deep  wound)     Status: None (Preliminary result)   Collection Time: 12/22/16  1:46 PM  Result Value Ref Range Status   Specimen Description ABSCESS RIGHT SHOULDER  Final   Special Requests SWABS SPEC C  Final   Gram Stain   Final    FEW WBC PRESENT,BOTH PMN AND MONONUCLEAR NO ORGANISMS SEEN    Culture PENDING  Incomplete   Report Status PENDING  Incomplete    Lars Mage, MD [facility] for [facility] [place] [facility] [phone] pager   [phone] cell 12/22/2016, 4:54 PM

## 2016-12-22 NOTE — Anesthesia Procedure Notes (Signed)
Anesthesia Regional Block: Interscalene brachial plexus block   Pre-Anesthetic Checklist: ,, timeout performed, Correct Patient, Correct Site, Correct Laterality, Correct Procedure, Correct Position, site marked, Risks and benefits discussed,  Surgical consent,  Pre-op evaluation,  At surgeon's request and post-op pain management  Laterality: Right  Prep: chloraprep       Needles:  Injection technique: Single-shot  Needle Type: Echogenic Stimulator Needle     Needle Length: 5cm  Needle Gauge: 22     Additional Needles:   Procedures: ultrasound guided, nerve stimulator,,,,,,   Nerve Stimulator or Paresthesia:  Response: biceps flexion, 0.45 mA,   Additional Responses:   Narrative:  Start time: 12/22/2016 12:50 PM End time: 12/22/2016 12:55 PM Injection made incrementally with aspirations every 5 mL.  Performed by: Personally  Anesthesiologist: Zaleah Ternes  Additional Notes: Functioning IV was confirmed and monitors were applied.  A 64mm 22ga Arrow echogenic stimulator needle was used. Sterile prep and drape,hand hygiene and sterile gloves were used.  Negative aspiration and negative test dose prior to incremental administration of local anesthetic. The patient tolerated the procedure well.  Ultrasound guidance: relevent anatomy identified, needle position confirmed, local anesthetic spread visualized around nerve(s), vascular puncture avoided.  Image printed for medical record.

## 2016-12-22 NOTE — Anesthesia Procedure Notes (Signed)
Procedure Name: Intubation Date/Time: 12/22/2016 1:20 PM Performed by: Melina Copa, Emrik Erhard R Pre-anesthesia Checklist: Patient identified, Emergency Drugs available, Suction available and Patient being monitored Patient Re-evaluated:Patient Re-evaluated prior to induction Oxygen Delivery Method: Circle System Utilized Preoxygenation: Pre-oxygenation with 100% oxygen Induction Type: IV induction Ventilation: Mask ventilation without difficulty Laryngoscope Size: Mac and 4 Grade View: Grade I Tube type: Oral Number of attempts: 1 Airway Equipment and Method: Stylet Placement Confirmation: ETT inserted through vocal cords under direct vision,  positive ETCO2 and breath sounds checked- equal and bilateral Secured at: 21 cm Tube secured with: Tape Dental Injury: Teeth and Oropharynx as per pre-operative assessment

## 2016-12-22 NOTE — Interval H&P Note (Signed)
History and Physical Interval Note:  12/22/2016 1:03 PM  [place]  has presented today for surgery, with the diagnosis of failed total shoulder  The various methods of treatment have been discussed with the patient and family. After consideration of risks, benefits and other options for treatment, the patient has consented to  Procedure(s): RIGHT TOTAL SHOULDER REVISION (Right) as a surgical intervention .  The patient's history has been reviewed, patient examined, no change in status, stable for surgery.  I have reviewed the patient's chart and labs.  Questions were answered to the patient's satisfaction.     Jake Dunn

## 2016-12-23 ENCOUNTER — Encounter (HOSPITAL_COMMUNITY): Payer: Self-pay | Admitting: Orthopedic Surgery

## 2016-12-23 DIAGNOSIS — T8459XA Infection and inflammatory reaction due to other internal joint prosthesis, initial encounter: Secondary | ICD-10-CM | POA: Diagnosis not present

## 2016-12-23 MED ORDER — VANCOMYCIN HCL 10 G IV SOLR
1500.0000 mg | Freq: Once | INTRAVENOUS | Status: AC
  Administered 2016-12-23: 1500 mg via INTRAVENOUS
  Filled 2016-12-23: qty 1500

## 2016-12-23 MED ORDER — ONDANSETRON HCL 4 MG PO TABS: 4.0000 mg | ORAL_TABLET | Freq: Three times a day (TID) | ORAL | 0 refills | Status: DC | PRN

## 2016-12-23 MED ORDER — HYDROMORPHONE HCL 4 MG PO TABS: ORAL_TABLET | ORAL | 0 refills | Status: DC

## 2016-12-23 MED ORDER — HEPARIN SOD (PORK) LOCK FLUSH 100 UNIT/ML IV SOLN
250.0000 [IU] | INTRAVENOUS | Status: AC | PRN
  Administered 2016-12-23: 250 [IU]

## 2016-12-23 MED ORDER — SODIUM CHLORIDE 0.9% FLUSH
10.0000 mL | INTRAVENOUS | Status: DC | PRN
  Administered 2016-12-24: 10 mL
  Filled 2016-12-23: qty 40

## 2016-12-23 MED ORDER — VANCOMYCIN HCL 10 G IV SOLR
1250.0000 mg | INTRAVENOUS | Status: DC
  Filled 2016-12-23: qty 1250

## 2016-12-23 NOTE — Anesthesia Postprocedure Evaluation (Signed)
Anesthesia Post Note  Patient: Jake Dunn  Procedure(s) Performed: Procedure(s) (LRB): RIGHT SHOULDER GLENOID AND HUMERAL COMPONENT REVISION (Right) IRRIGATION AND DEBRIDEMENT RIGHT SHOULDER (Right)     Patient location during evaluation: PACU Anesthesia Type: General and Regional Level of consciousness: awake and alert and patient cooperative Pain management: pain level controlled Vital Signs Assessment: post-procedure vital signs reviewed and stable Respiratory status: spontaneous breathing and respiratory function stable Cardiovascular status: stable Anesthetic complications: no    Last Vitals:  Vitals:   12/23/16 0043 12/23/16 0529  BP: (!) 113/59 120/60  Pulse: 76 78  Resp: 18 18  Temp: 36.9 C 36.8 C  SpO2: 93% 96%    Last Pain:  Vitals:   12/23/16 1245  TempSrc:   PainSc: 5                  Ercelle Winkles S

## 2016-12-23 NOTE — Progress Notes (Signed)
Per Delana Meyer - PA, will hold discharge of patient today.

## 2016-12-23 NOTE — Progress Notes (Signed)
Subjective: 1 Day Post-Op Procedure(s) (LRB): RIGHT SHOULDER GLENOID AND HUMERAL COMPONENT REVISION (Right) IRRIGATION AND DEBRIDEMENT RIGHT SHOULDER (Right) Patient reports pain as mild.  Doing well this am.  Much less pain than pre-op.  Wearing sling  Objective: Vital signs in last 24 hours: Temp:  [97.6 F (36.4 C)-98.4 F (36.9 C)] 98.2 F (36.8 C) (08/17 0529) Pulse Rate:  [58-80] 78 (08/17 0529) Resp:  [17-18] 18 (08/17 0529) BP: (100-126)/(57-72) 120/60 (08/17 0529) SpO2:  [93 %-100 %] 96 % (08/17 0529) Weight:  [101.2 kg (223 lb)] 101.2 kg (223 lb) (08/16 1031)  Intake/Output from previous day: 08/16 0701 - 08/17 0700 In: 1292.5 [P.O.:240; I.V.:1052.5] Out: 580 [Urine:430; Blood:150] Intake/Output this shift: No intake/output data recorded.   Recent Labs  12/22/16 1138  HGB 10.8*    Recent Labs  12/22/16 1138  WBC 5.6  RBC 3.80*  HCT 33.4*  PLT 233    Recent Labs  12/22/16 1138  NA 138  K 4.0  CL 105  CO2 26  BUN 16  CREATININE 1.23  GLUCOSE 99  CALCIUM 9.0   No results for input(s): LABPT, INR in the last 72 hours.  Neurologically intact Neurovascular intact Sensation intact distally Intact pulses distally Dorsiflexion/Plantar flexion intact Incision: dressing C/D/I No cellulitis present Compartment soft  Assessment/Plan: 1 Day Post-Op Procedure(s) (LRB): RIGHT SHOULDER GLENOID AND HUMERAL COMPONENT REVISION (Right) IRRIGATION AND DEBRIDEMENT RIGHT SHOULDER (Right) Advance diet  Continue sling RUE at all times Awaiting recs from Dr. Megan Salon regarding abx therapy Patient is outpatient with extended recovery, but would like to keep until later today for ID to see prior to d/c Patient has picc line in place and hh nurse arranged.  abx can be called in from ID office pending [place] 12/23/2016, 8:32 AM

## 2016-12-23 NOTE — Care Management Note (Signed)
Case Management Note  Patient Details  Name: Jake Dunn MRN: [id number] Date of Birth: [birth year]  Subjective/Objective:   76 yr old male admitted with right prosthetic shoulder infection. Patient underwent Right shoulder revision and I&D.                Action/Plan: Patient has PICC line and has been setup with [place] for IV antibiotics and PICC line care. Will have family support at discharge.   Expected Discharge Date:    12/23/16              Expected Discharge Plan:  [place]  In-House Referral:  NA  Discharge planning Services  CM Consult  Post Acute Care Choice:  Home Health Choice offered to:   (patient was preoperatively setup)  DME Arranged:  IV pump/equipment DME Agency:  NA  HH Arranged:  RN [place] Agency:  [place]  Status of Service:  Completed, signed off  If discussed at [place] of Stay Meetings, dates discussed:    Additional Comments:  Ninfa Meeker, RN 12/23/2016, 10:24 AM

## 2016-12-23 NOTE — Discharge Summary (Signed)
Patient ID: Jake Dunn MRN: [id number] DOB/AGE: 1940/08/30 76 y.o.  Admit date: 12/22/2016 Discharge date: 12/23/2016  Admission Diagnoses:  Active Problems:   Abscess of right shoulder   Prosthetic shoulder infection [facility])   Discharge Diagnoses:  Same  Past Medical History:  Diagnosis Date  . Adenomatous polyp   . Anxiety   . Arthritis    "knees, ankles, shoulders" (08/15/2016)  . CAD (coronary artery disease)    s/p cath in 2014 showing significant stenosis in the diagonal side branches and in the RV marginal branch. These vessels were small and diffusely diseased. He is managed medically. 08/16/16 Cath, no disease progression  . Chronic lower back pain   . Disc disease, degenerative, cervical   . GERD (gastroesophageal reflux disease)   . Gout   . Hyperlipidemia   . Hypertension   . Osteoarthritis   . Renal cell carcinoma    left kidney removal  . Stroke [facility])    MINI STROKE 15+ YRS AGO, no residual  . Stroke ([place])    "I've had 2 or 3"; denies residual on 08/15/2016  . Syncope and collapse     Surgeries: Procedure(s): RIGHT SHOULDER GLENOID AND HUMERAL COMPONENT REVISION IRRIGATION AND DEBRIDEMENT RIGHT SHOULDER on 12/22/2016   Consultants:   Discharged Condition: Improved  Hospital Course: Jake Dunn is an 76 y.o. male who was admitted 12/22/2016 for operative treatment of<principal problem not specified>. Patient has severe unremitting pain that affects sleep, daily activities, and work/hobbies. After pre-op clearance the patient was taken to the operating room on 12/22/2016 and underwent  Procedure(s): RIGHT SHOULDER GLENOID AND HUMERAL COMPONENT REVISION IRRIGATION AND DEBRIDEMENT RIGHT SHOULDER.    Patient was given perioperative antibiotics: Anti-infectives    Start     Dose/Rate Route Frequency Ordered Stop   12/22/16 1145  vancomycin (VANCOCIN) 1,500 mg in sodium chloride 0.9 % 500 mL IVPB  Status:  Discontinued     1,500 mg 250 mL/hr over 120 Minutes  Intravenous On call to O.R. 12/21/16 1439 12/22/16 4196       Patient was given sequential compression devices, early ambulation, and chemoprophylaxis to prevent DVT.  Patient benefited maximally from hospital stay and there were no complications.    Recent vital signs: Patient Vitals for the past 24 hrs:  BP Temp Temp src Pulse Resp SpO2 Height Weight  12/23/16 0529 120/60 98.2 F (36.8 C) Oral 78 18 96 % - -  12/23/16 0043 (!) 113/59 98.4 F (36.9 C) Oral 76 18 93 % - -  12/22/16 2318 100/60 97.9 F (36.6 C) Oral 80 18 97 % - -  12/22/16 1643 125/72 97.7 F (36.5 C) - 71 - 96 % - -  12/22/16 1626 - 97.7 F (36.5 C) - - - - - -  12/22/16 1610 114/63 - - 70 17 95 % - -  12/22/16 1556 - 97.7 F (36.5 C) - - - - - -  12/22/16 1555 (!) 113/57 - - 74 17 98 % - -  12/22/16 1540 104/61 - - 71 18 100 % - -  12/22/16 1526 113/67 97.6 F (36.4 C) - 68 17 96 % - -  12/22/16 1031 126/68 97.7 F (36.5 C) Oral (!) 58 18 100 % 5\' 8"  (1.727 m) 101.2 kg (223 lb)     Recent laboratory studies:  Recent Labs  12/22/16 1138  WBC 5.6  HGB 10.8*  HCT 33.4*  PLT 233  NA 138  K 4.0  CL 105  CO2 26  BUN 16  CREATININE 1.23  GLUCOSE 99  CALCIUM 9.0     Discharge Medications:   Allergies as of 12/23/2016      Reactions   Adhesive [tape] Rash   Paper tape okay   Latex Rash      Medication List    STOP taking these medications   HYDROcodone-acetaminophen 5-325 MG tablet Commonly known as:  NORCO/VICODIN     TAKE these medications   acyclovir 400 MG tablet Commonly known as:  ZOVIRAX Take 400 mg by mouth 3 (three) times daily as needed. For cold sores.   allopurinol 300 MG tablet Commonly known as:  ZYLOPRIM Take 300 mg by mouth daily as needed (for gout flares).   aspirin 81 MG EC tablet Take 1 tablet (81 mg total) by mouth daily. What changed:  when to take this   atorvastatin 80 MG tablet Commonly known as:  LIPITOR Take 1 tablet (80 mg total) by mouth every  evening.   clopidogrel 75 MG tablet Commonly known as:  PLAVIX Take 75 mg by mouth every evening.   colchicine 0.6 MG tablet Take 0.6 mg by mouth daily as needed (for gout flares.).   diphenhydrAMINE 25 MG tablet Commonly known as:  BENADRYL Take 25 mg by mouth every 6 (six) hours as needed (for allergic reactions (perfumes/candles)).   EPINEPHrine 0.3 mg/0.3 mL Soaj injection Commonly known as:  EPI-PEN USE AS DIRECTED IF LIFE THREATENING REACTION OCCURS   hydrochlorothiazide 12.5 MG capsule Commonly known as:  MICROZIDE Take 12.5 mg by mouth every evening.   HYDROmorphone 4 MG tablet Commonly known as:  DILAUDID Take 1/2-1 tab po q 4 hours prn pain   isosorbide mononitrate 60 MG 24 hr tablet Commonly known as:  IMDUR Take 1 tablet (60 mg total) by mouth daily. What changed:  when to take this   LORazepam 0.5 MG tablet Commonly known as:  ATIVAN Take 0.5 mg by mouth 2 (two) times daily as needed for anxiety.   metoprolol tartrate 50 MG tablet Commonly known as:  LOPRESSOR Take 1 tablet (50 mg total) by mouth daily. What changed:  when to take this   nitroGLYCERIN 0.4 MG SL tablet Commonly known as:  NITROSTAT Place 1 tablet (0.4 mg total) under the tongue every 5 (five) minutes x 3 doses as needed for chest pain.   ondansetron 4 MG tablet Commonly known as:  ZOFRAN Take 1 tablet (4 mg total) by mouth every 8 (eight) hours as needed for nausea or vomiting.   pantoprazole 40 MG tablet Commonly known as:  PROTONIX Take 40 mg by mouth every evening.   valsartan 320 MG tablet Commonly known as:  DIOVAN Take 320 mg by mouth every evening.       Diagnostic Studies: No results found.  Disposition: 01-Home or Self Care    Follow-up Information    [name], MD. Schedule an appointment as soon as possible for a visit in 1 week(s).   Specialty:  Orthopedic Surgery Contact information: [address] [place]  [phone]            Signed: Fannie Knee 12/23/2016, 8:23 AM

## 2016-12-23 NOTE — Discharge Instructions (Signed)
Do not remove bandage.  Do not get bandage wet.  Wear sling at all times.  Elevate for swelling.  Fu appt in one week with dr.murphy.  Continue abx per infectious disease

## 2016-12-23 NOTE — Op Note (Signed)
NAMEBURHANUDDIN, KOHLMANN NO.:  [id number]  MEDICAL RECORD NO.:  [id number]  LOCATION:                                 FACILITY:  PHYSICIAN:  [name], M.D.      DATE OF BIRTH:  DATE OF PROCEDURE:  12/22/2016 DATE OF DISCHARGE:                              OPERATIVE REPORT   PREOPERATIVE DIAGNOSES:  Status post reverse right total shoulder. Previous irrigation and debridement of superficial infection.  Question extension of infection down into the shoulder itself with recurrence of superficial infection and marked hypertrophic granulation tissue on overgrowth in the incision.  POSTOPERATIVE DIAGNOSES:  Status post reverse right total shoulder. Previous irrigation and debridement of superficial infection.  Question extension of infection down into the shoulder itself with recurrence of superficial infection and marked hypertrophic granulation tissue on overgrowth in the incision.  With evidence of subcutaneous necrotic debris.  Synovitis, some necrotic debris within the shoulder, itself, but no gross purulence there.  PROCEDURE:  Right shoulder exploration with extensive debridement of soft tissue, granulation tissue,and necrotic debris.  Thorough irrigation.  Exploration, irrigation, debridement of the total shoulder itself with revision of the polyethylene and metallic baseplate of the humerus as well as revision of the glenosphere of the glenoid.  SURGEON:  [name], M.D.  ASSISTANT:  Elmyra Ricks, PA, present throughout the entire case and necessary for timely completion of procedure.  ANESTHESIA:  General.  BLOOD LOSS:  100 mL.  BLOOD GIVEN:  None.  SPECIMENS:  None.  CULTURES:  Obtained both of the subcutaneous tissue as well as the deep tissue, not only cultures, but specimens of soft tissue in both areas, sent for culture as well.  DESCRIPTION OF PROCEDURE:  Jake Dunn was brought to the operating room today at [facility].  General  anesthesia.  Shoulder examined.  He has about 60-70% of his motion not surprising after reverse total shoulder.  Placed in beach-chair position, prepped and draped in usual sterile fashion.  I then opened up his previous incision.  In the center of this, was an area of 5 cm bulbous necrotic granulation tissue and overgrowth that looked like a big carbuncle.  This was completely excised.  This was necrotic debris, a little bit appearance, but there was no gross pus throughout.  After completely excising that, I then did a thorough irrigation and debridement of the subcutaneous tissue.  This included going down the deltopectoral interval, debriding all abnormal tissue there.  There was still a pseudocapsule over the humerus, itself. Subscap was taken down, retained FiberWire sutures were removed.  Within the shoulder itself, there was no gross purulence, but there was some necrotic debris.  Both superficial and deep tissue were sent as specimen for culture and also swabbed for aerobic, anaerobic culture, were taken superficial and deep.  I removed the base plate and the polyethylene from the humerus and the glenosphere from the glenoid.  I then did a copious irrigation of the shoulder utilizing 9 L of saline with pulse lavage.  There was a little necrotic debridement of the glenosphere, but again no gross purulence, and the remaining components were well fixed. After thorough irrigation,  I revised the glenoid with a new glenosphere component and then revised humerus with a new base plate and polyethylene component, all the same size we had used previously.  The subscap was reattached with Vicryl, no nonabsorbable deep sutures were used.  Soft tissues irrigated again.  Deltopectoral interval loosely closed with Vicryl and then a closure of the wound with retention nylon suture.  Sterile compressive dressing applied.  Shoulder immobilizer applied.  Anesthesia reversed.  Brought to the recovery  room.  Tolerated the surgery well.  No complications.     [name], M.D.   ______________________________ [name], M.D.    DFM/MEDQ  D:  12/22/2016  T:  12/22/2016  Job:  [id number]

## 2016-12-23 NOTE — Progress Notes (Signed)
Pharmacy Antibiotic Note  Jake Dunn is a 76 y.o. male admitted on 12/22/2016 with R shoulder infection s/p  Revision 11/17.   Pharmacy has been consulted for vancomycin  Dosing.  WBC wnl, Afebrile awaiting culture data S/p re- I&D - no documented abx in OR and per family last dose of vancomyicn was PTA 8/15  Plan: Vancomycin 1500mg  IV x1 today  Vancomycin 1250 mg IV q24h start tomorrow   Height: 5\' 8"  (172.7 cm) Weight: 223 lb (101.2 kg) IBW/kg (Calculated) : 68.4  Temp (24hrs), Avg:98.5 F (36.9 C), Min:97.9 F (36.6 C), Max:99.3 F (37.4 C)   Recent Labs Lab 12/22/16 1138  WBC 5.6  CREATININE 1.23    Estimated Creatinine Clearance: 60.7 mL/min (by C-G formula based on SCr of 1.23 mg/dL).    Allergies  Allergen Reactions  . Adhesive [Tape] Rash    Paper tape okay  . Latex Rash    Antimicrobials this admission:   Dose adjustments this admission:   Microbiology results: Tissue Cx 6/18 (PROPIONIBACTERIUM ACNES - anaerobic gram +)  Bonnita Nasuti Pharm.D. CPP, BCPS Clinical Pharmacist [phone] 12/23/2016 5:12 PM

## 2016-12-23 NOTE — Care Management (Signed)
Spoke with Santiago Glad with [facility]. Need prescription for IV ABX . Patient will not have nursing visit tonight . Start of care will be tomorrow between 1200 and 1500. Patient does not have Vancomycin at home. Will have to know dose and frequency of medication.   Spoke with Mendel Ryder PA, she is waiting on pharmacy to make recommendations on dose and frequency before she can write prescription. Explained above. Per Mendel Ryder discharge will be tomorrow 12-24-16 .   Bedside nurse and Santiago Glad with [facility] aware. Bedside nurse will explain to patient .  Magdalen Spatz RN BSN [phone]

## 2016-12-24 DIAGNOSIS — T8459XA Infection and inflammatory reaction due to other internal joint prosthesis, initial encounter: Secondary | ICD-10-CM | POA: Diagnosis not present

## 2016-12-24 LAB — AEROBIC CULTURE W GRAM STAIN (SUPERFICIAL SPECIMEN)
Culture: NO GROWTH
Culture: NO GROWTH

## 2016-12-24 LAB — AEROBIC CULTURE  (SUPERFICIAL SPECIMEN)

## 2016-12-24 MED ORDER — VANCOMYCIN IV (FOR PTA / DISCHARGE USE ONLY): 1250.0000 mg | INTRAVENOUS | 0 refills | Status: DC

## 2016-12-24 MED ORDER — HEPARIN SOD (PORK) LOCK FLUSH 100 UNIT/ML IV SOLN: 250.0000 [IU] | Freq: Every day | INTRAVENOUS | 1 refills | Status: DC

## 2016-12-24 MED ORDER — VANCOMYCIN HCL 10 G IV SOLR: 1250.0000 mg | INTRAVENOUS | 0 refills | Status: DC

## 2016-12-24 MED ORDER — SODIUM CHLORIDE 0.9% FLUSH: 10.0000 mL | INTRAVENOUS | 3 refills | Status: DC | PRN

## 2016-12-24 MED ORDER — HEPARIN SOD (PORK) LOCK FLUSH 100 UNIT/ML IV SOLN
250.0000 [IU] | INTRAVENOUS | Status: AC | PRN
  Administered 2016-12-24: 250 [IU]

## 2016-12-24 NOTE — Care Management Note (Addendum)
Case Management Note  Patient Details  Name: Jake Dunn MRN: [id number] Date of Birth: [birth year]  Subjective/Objective:     Spoke with PA and Bedside RN, all Pharmacy orders signed. Must initiate with [facility] on 12/25/2016. Must have dose in hospital today prior to discharge.  After speaking with pt and wife at bedside they have been administering IV abx x 9 weeks with only weekly visits of HHRN for Blood draws etc. Will only need New dose Abx for this pm. Pt states he has been attempting to tell staff this, without success x 24 h. Somewhat upset at onset of my visit. Pleased to hear that [facility] will deliver new dose of Vanc to his home by 1600 hrs and he is free to be discharged. HHRN will arrive by 12/26/2016 to do blood draws.                Action/Plan: CM will sign off for now but will be available should additional discharge needs arise or disposition change.    Expected Discharge Date:  12/24/16               Expected Discharge Plan:  [place]  In-House Referral:  NA  Discharge planning Services  CM Consult  Post Acute Care Choice:  Home Health Choice offered to:   (patient was preoperatively setup)  DME Arranged:  IV pump/equipment DME Agency:  NA  HH Arranged:  RN [place] Agency:  [place]  Status of Service:  Completed, signed off  If discussed at [place] of Stay Meetings, dates discussed:    Additional Comments:  Delrae Sawyers, RN 12/24/2016, 9:14 AM

## 2016-12-24 NOTE — Progress Notes (Signed)
Patient was discharged to home with home health services for home antibiotic via PICC line. AVS was given to patient and patient verbalized understanding of contents. Patient agreed and verbalized understanding with no further questions at this moment. Patient was transported to front lobby via wheelchair accompanied by his spouse.   Vergia Alberts n 12/24/16 10:20 AM

## 2016-12-24 NOTE — Progress Notes (Signed)
PHARMACY CONSULT NOTE FOR:  OUTPATIENT  PARENTERAL ANTIBIOTIC THERAPY (OPAT)  Indication: Prosthetic should infection Regimen: Vancomycin 1,250mg  IV Q24h End date: 02/03/17  No vancomycin trough yet so would recommend close follow up if discharged soon  IV antibiotic discharge orders are pended. To discharging provider:  please sign these orders via discharge navigator,  Select New Orders & click on the button choice - Manage This Unsigned Work.    Thank you for allowing pharmacy to be a part of this patient's care.  Elenor Quinones, PharmD, BCPS Clinical Pharmacist Pager [phone] 12/24/2016 8:46 AM

## 2016-12-26 ENCOUNTER — Telehealth: Payer: Self-pay

## 2016-12-26 DIAGNOSIS — Z452 Encounter for adjustment and management of vascular access device: Secondary | ICD-10-CM | POA: Diagnosis not present

## 2016-12-26 DIAGNOSIS — Z8673 Personal history of transient ischemic attack (TIA), and cerebral infarction without residual deficits: Secondary | ICD-10-CM | POA: Diagnosis not present

## 2016-12-26 DIAGNOSIS — Z7902 Long term (current) use of antithrombotics/antiplatelets: Secondary | ICD-10-CM | POA: Diagnosis not present

## 2016-12-26 DIAGNOSIS — L02413 Cutaneous abscess of right upper limb: Secondary | ICD-10-CM | POA: Diagnosis not present

## 2016-12-26 DIAGNOSIS — T8459XD Infection and inflammatory reaction due to other internal joint prosthesis, subsequent encounter: Secondary | ICD-10-CM | POA: Diagnosis not present

## 2016-12-26 NOTE — Telephone Encounter (Signed)
Alinda Deem from [facility] called to get a verbal to restart the patient PICC and nursing care since he was recently released from hospital. Gave verbal and advised they will send the order for signature in next day or so.

## 2016-12-26 NOTE — Telephone Encounter (Signed)
Message on voicemail from patient's wife.  She has questions regarding his IV antibiotics.  I will forward message to Janene Madeira, NP.

## 2016-12-26 NOTE — Telephone Encounter (Signed)
Called Ms. Armstead back - she wanted an update on the report of the culture that was obtained from his surgery. I reviewed all culture samples and no growth at day 4. Informed her that there is still another day we will monitor for growth. She inquired as to the plan going forward should the cultures not grow anything; I informed her that he would potentially remain on the vancomycin to ensure coverage based on his previous micro results (PAcnes) and preliminary Gm+ cocci on tissue GS. She again wanted to discuss the Penicillin and had he been on it in the first place would this re-infection have happened. Informed her that he was on correct antibiotics. Discussed that with prostheses there is no natural blood supply like our native joints so source control with surgery (I&D vs Exchange) is very important for a chance of cure in addition to antibiotics. She seemed to understand this.   Reported Isacc is home and doing well in his recovery. He has FU appointment with Dr. Linus Salmons 01/24/17. I informed her I would check his cultures again in the morning to see if any new information and call her if there is something to report.   Janene Madeira, NP

## 2016-12-27 DIAGNOSIS — Z8673 Personal history of transient ischemic attack (TIA), and cerebral infarction without residual deficits: Secondary | ICD-10-CM | POA: Diagnosis not present

## 2016-12-27 DIAGNOSIS — L02413 Cutaneous abscess of right upper limb: Secondary | ICD-10-CM | POA: Diagnosis not present

## 2016-12-27 DIAGNOSIS — Z7902 Long term (current) use of antithrombotics/antiplatelets: Secondary | ICD-10-CM | POA: Diagnosis not present

## 2016-12-27 DIAGNOSIS — T8459XD Infection and inflammatory reaction due to other internal joint prosthesis, subsequent encounter: Secondary | ICD-10-CM | POA: Diagnosis not present

## 2016-12-27 DIAGNOSIS — Z452 Encounter for adjustment and management of vascular access device: Secondary | ICD-10-CM | POA: Diagnosis not present

## 2016-12-27 LAB — AEROBIC/ANAEROBIC CULTURE (SURGICAL/DEEP WOUND): CULTURE: NO GROWTH

## 2016-12-27 LAB — AEROBIC/ANAEROBIC CULTURE W GRAM STAIN (SURGICAL/DEEP WOUND): Culture: NO GROWTH

## 2016-12-27 LAB — ANAEROBIC CULTURE

## 2017-01-02 DIAGNOSIS — T8459XD Infection and inflammatory reaction due to other internal joint prosthesis, subsequent encounter: Secondary | ICD-10-CM | POA: Diagnosis not present

## 2017-01-02 DIAGNOSIS — Z8673 Personal history of transient ischemic attack (TIA), and cerebral infarction without residual deficits: Secondary | ICD-10-CM | POA: Diagnosis not present

## 2017-01-02 DIAGNOSIS — Z7902 Long term (current) use of antithrombotics/antiplatelets: Secondary | ICD-10-CM | POA: Diagnosis not present

## 2017-01-02 DIAGNOSIS — Z452 Encounter for adjustment and management of vascular access device: Secondary | ICD-10-CM | POA: Diagnosis not present

## 2017-01-02 DIAGNOSIS — M19011 Primary osteoarthritis, right shoulder: Secondary | ICD-10-CM | POA: Diagnosis not present

## 2017-01-02 DIAGNOSIS — L02413 Cutaneous abscess of right upper limb: Secondary | ICD-10-CM | POA: Diagnosis not present

## 2017-01-03 ENCOUNTER — Other Ambulatory Visit: Payer: Self-pay | Admitting: Pharmacist

## 2017-01-03 DIAGNOSIS — T849XXA Unspecified complication of internal orthopedic prosthetic device, implant and graft, initial encounter: Secondary | ICD-10-CM | POA: Diagnosis not present

## 2017-01-05 DIAGNOSIS — R531 Weakness: Secondary | ICD-10-CM | POA: Diagnosis not present

## 2017-01-05 DIAGNOSIS — Z471 Aftercare following joint replacement surgery: Secondary | ICD-10-CM | POA: Diagnosis not present

## 2017-01-05 DIAGNOSIS — M19011 Primary osteoarthritis, right shoulder: Secondary | ICD-10-CM | POA: Diagnosis not present

## 2017-01-05 DIAGNOSIS — M25511 Pain in right shoulder: Secondary | ICD-10-CM | POA: Diagnosis not present

## 2017-01-06 DIAGNOSIS — T8459XD Infection and inflammatory reaction due to other internal joint prosthesis, subsequent encounter: Secondary | ICD-10-CM | POA: Diagnosis not present

## 2017-01-06 DIAGNOSIS — Z452 Encounter for adjustment and management of vascular access device: Secondary | ICD-10-CM | POA: Diagnosis not present

## 2017-01-06 DIAGNOSIS — L02413 Cutaneous abscess of right upper limb: Secondary | ICD-10-CM | POA: Diagnosis not present

## 2017-01-06 DIAGNOSIS — I10 Essential (primary) hypertension: Secondary | ICD-10-CM | POA: Diagnosis not present

## 2017-01-06 DIAGNOSIS — Z7902 Long term (current) use of antithrombotics/antiplatelets: Secondary | ICD-10-CM | POA: Diagnosis not present

## 2017-01-06 DIAGNOSIS — Z8673 Personal history of transient ischemic attack (TIA), and cerebral infarction without residual deficits: Secondary | ICD-10-CM | POA: Diagnosis not present

## 2017-01-06 DIAGNOSIS — M19011 Primary osteoarthritis, right shoulder: Secondary | ICD-10-CM | POA: Diagnosis not present

## 2017-01-08 NOTE — Progress Notes (Signed)
Cardiology Office Note    Date:  01/11/2017   ID:  Jake Dunn, Jake Dunn Feb 08, 1941, MRN [id number]  PCP:  Harlan Stains, MD  Cardiologist:  Dr. Peter Martinique  Chief Complaint  Patient presents with  . Follow-up    NO chest pain, shortness of breath, edema, pain or cramping in legs, lightheaded or dizziness  . Hypertension  . Coronary Artery Disease    History of Present Illness:  Jake Dunn is a 76 y.o. male seen for post hospital follow up. He has a  PMH of nonobstructive CAD in the past (70% 3rd diag '14), GERD, HTN, HL, renal cell CA s/p left nephrectomy. Last cardiac catheterization in 2014 showed nonobstructive CAD with 70% lesion in third diagonal that was treated medically. He is fairly active and recently undergoing physical therapy after his right shoulder surgery 5 months ago. He presented with left sided chest pressure on 08/15/2016 while at Urology office. BP was quite high that day. He underwent cardiac catheterization on 08/16/2016, this revealed two-vessel CAD with moderate calcified stenosis in the mid LAD, second diagonal, ramus intermedius, ostial and proximal left circumflex and a severe stenosis of the second PLA branch of the RCA. Overall normal LVEDP and normal LVEF. Recommended  medical therapy and outpatient exercise stress Myoview. While the LAD and the diagonal only showed moderate progression compared to the 2010/2014 studies, the left circumflex and intermediate stenosis had progressed. Echocardiogram obtained on 08/17/2016 showed EF 76-19%, grade 2 diastolic dysfunction, severely dilated left atrium, PA peak pressure 30 mmHg. Lexiscan Myoview obtained on 08/23/2016 showed EF 51%, no perfusion defect at stress, overall low risk study.   When seen April 20th his Imdur was increased and amlodipine was added. Afterwards  patient states he could not function. He felt very fatigued and lightheaded. He felt drunk all the time.  In the past he has had problems with aggressive  BP control with orthostatic hypotension and syncope. His amlodipine was discontinued due to low BP and symptoms. He states he also reduced his metoprolol to once a day. Afterwards he felt much better. Fatigue and lightheadedness have resolved. BP at home has been excellent. He does have a lot of neck pain radiating to his shoulders and into his chest. He had an MRI  showing significant cervical disease. He is followed by Dr. Ellene Route.   In June 2018 he was admitted with abscess of his right shoulder (previous shoulder replacement). He underwent I&D of abscess. In August he returned with unremitting pain in right shoulder with infected joint. He underwent surgery with glenoid and humeral component revision and drainage of recurrent infection. He had a PICC line placed is receiving outpatient IV antibiotics. He notes a lot of pain still in his shoulder. He denies any chest pain or SOB. Notes BP has been doing real well.   Past Medical History:  Diagnosis Date  . Adenomatous polyp   . Anxiety   . Arthritis    "knees, ankles, shoulders" (08/15/2016)  . CAD (coronary artery disease)    s/p cath in 2014 showing significant stenosis in the diagonal side branches and in the RV marginal branch. These vessels were small and diffusely diseased. He is managed medically. 08/16/16 Cath, no disease progression  . Chronic lower back pain   . Disc disease, degenerative, cervical   . GERD (gastroesophageal reflux disease)   . Gout   . Hyperlipidemia   . Hypertension   . Osteoarthritis   . Renal cell carcinoma  left kidney removal  . Stroke [facility])    MINI STROKE 15+ YRS AGO, no residual  . Stroke ([place])    "I've had 2 or 3"; denies residual on 08/15/2016  . Syncope and collapse     Past Surgical History:  Procedure Laterality Date  . ANTERIOR CERVICAL DECOMP/DISCECTOMY FUSION  02/2004; 04/2005   [name] 5/14/2012Marland Kitchen [name] 09/20/2010  . BACK SURGERY    . CARDIAC CATHETERIZATION  12/14/2008   ef 50-55%. SHOWED  SIGNIFICANT STENOSIS AND TO DIAGONAL SIDE BRANCHES AND IN THE RIGHT VENTRICULAR MARGINAL BRANCH. THESE VESSELS WERE SMALL AND DIFFUSELY DISEASED  . CARDIOVASCULAR STRESS TEST  12/10/2009   EF 61%. EKG negative. Mild peri ischemia. Managed medically.   . CARPAL TUNNEL RELEASE Right 11/2005   [name] 09/20/2010  . CARPAL TUNNEL RELEASE Left 06/2007   [name] 5/132012  . COLONOSCOPY  11/2004   [name] 09/20/2010  . COLONOSCOPY W/ BIOPSIES AND POLYPECTOMY  09/2002   [name] 09/20/2010  . INCISION AND DRAINAGE ABSCESS Right 10/18/2016   Procedure: INCISION AND DRAINAGE ABSCESS RIGHT SHOULDER;  Surgeon: [name], MD;  Location: [place];  Service: Orthopedics;  Laterality: Right;  . INCISION AND DRAINAGE OF WOUND Right 08/2005   shoulder/notes 09/20/2010  . IR FLUORO GUIDE CV LINE LEFT  10/19/2016  . IR US GUIDE VASC ACCESS LEFT  10/19/2016  . IRRIGATION AND DEBRIDEMENT SHOULDER Right 12/22/2016   Procedure: IRRIGATION AND DEBRIDEMENT RIGHT SHOULDER;  Surgeon: [name], MD;  Location: [place];  Service: Orthopedics;  Laterality: Right;  . JOINT REPLACEMENT    . LEFT HEART CATH AND CORONARY ANGIOGRAPHY N/A 08/16/2016   Procedure: Left Heart Cath and Coronary Angiography;  Surgeon: [name], MD;  Location: [place] [facility];  Service: Cardiovascular;  Laterality: N/A;  . LEFT HEART CATHETERIZATION WITH CORONARY ANGIOGRAM N/A 08/09/2012   Procedure: LEFT HEART CATHETERIZATION WITH CORONARY ANGIOGRAM;  Surgeon: Peter M Martinique, MD;  Location: [facility] CATH [facility];  Service: Cardiovascular;  Laterality: N/A;  . LUMBAR FUSION    . NEPHRECTOMY Left early 2000s  . REPLACEMENT TOTAL KNEE Right 08/2008   [name] 09/07/2010  . SHOULDER ARTHROSCOPY WITH ROTATOR CUFF REPAIR Right 06/2005   [name] 09/20/2010  . TOTAL KNEE ARTHROPLASTY  06/08/2011   Procedure: TOTAL KNEE ARTHROPLASTY;  Surgeon: [name], MD;  Location: [place];  Service: Orthopedics;  Laterality: Left;  . TOTAL SHOULDER  ARTHROPLASTY Right 04/06/2016   Procedure: RIGHT REVERSE TOTAL SHOULDER ARTHROPLASTY;  Surgeon: [name], MD;  Location: [place];  Service: Orthopedics;  Laterality: Right;  . TOTAL SHOULDER REVISION Right 12/22/2016   Procedure: RIGHT SHOULDER GLENOID AND HUMERAL COMPONENT REVISION;  Surgeon: [name], MD;  Location: [place];  Service: Orthopedics;  Laterality: Right;  . US ECHOCARDIOGRAPHY  12/15/2008   EF 60-65%  . US ECHOCARDIOGRAPHY  09/28/2004   EF 55-60%    Current Medications: Allergies as of 01/11/2017      Reactions   Adhesive [tape] Rash   Paper tape okay   Latex Rash      Medication List       Accurate as of 01/11/17  8:27 AM. Always use your most recent med list.          acyclovir 400 MG tablet Commonly known as:  ZOVIRAX Take 400 mg by mouth 3 (three) times daily as needed. For cold sores.   allopurinol 300 MG tablet Commonly known as:  ZYLOPRIM Take 300 mg by mouth daily  as needed (for gout flares).   aspirin 81 MG EC tablet Take 1 tablet (81 mg total) by mouth daily.   atorvastatin 80 MG tablet Commonly known as:  LIPITOR Take 1 tablet (80 mg total) by mouth every evening.   clopidogrel 75 MG tablet Commonly known as:  PLAVIX Take 75 mg by mouth every evening.   colchicine 0.6 MG tablet Take 0.6 mg by mouth daily as needed (for gout flares.).   diphenhydrAMINE 25 MG tablet Commonly known as:  BENADRYL Take 25 mg by mouth every 6 (six) hours as needed (for allergic reactions (perfumes/candles)).   EPINEPHrine 0.3 mg/0.3 mL Soaj injection Commonly known as:  EPI-PEN USE AS DIRECTED IF LIFE THREATENING REACTION OCCURS   heparin lock flush 100 UNIT/ML Soln injection 2.5 mLs (250 Units total) by Intracatheter route daily.   hydrochlorothiazide 12.5 MG capsule Commonly known as:  MICROZIDE Take 12.5 mg by mouth every evening.   HYDROmorphone 4 MG tablet Commonly known as:  DILAUDID Take 1/2-1 tab po q 4 hours prn pain   isosorbide  mononitrate 60 MG 24 hr tablet Commonly known as:  IMDUR Take 1 tablet (60 mg total) by mouth daily.   LORazepam 0.5 MG tablet Commonly known as:  ATIVAN Take 0.5 mg by mouth 2 (two) times daily as needed for anxiety.   metoprolol tartrate 50 MG tablet Commonly known as:  LOPRESSOR Take 1 tablet (50 mg total) by mouth daily.   nitroGLYCERIN 0.4 MG SL tablet Commonly known as:  NITROSTAT Place 1 tablet (0.4 mg total) under the tongue every 5 (five) minutes x 3 doses as needed for chest pain.   olmesartan 40 MG tablet Commonly known as:  BENICAR Take 1 tablet by mouth daily.   ondansetron 4 MG tablet Commonly known as:  ZOFRAN Take 1 tablet (4 mg total) by mouth every 8 (eight) hours as needed for nausea or vomiting.   pantoprazole 40 MG tablet Commonly known as:  PROTONIX Take 40 mg by mouth every evening.   sodium chloride flush 0.9 % Soln Commonly known as:  NS 10 mLs by Intracatheter route as needed (flush).   vancomycin 1,250 mg in sodium chloride 0.9 % 250 mL Inject 1,250 mg into the vein daily.   vancomycin IVPB Inject 1,250 mg into the vein daily. Indication:  Infected total shoulder Last Day of Therapy:  02/04/2017 Labs - Sunday/Monday:  CBC/D, BMP, and vancomycin trough. Labs - Thursday:  BMP and vancomycin trough Labs - Every other week:  ESR and CRP        Allergies:   Adhesive [tape] and Latex   Social History   Social History  . Marital status: Married    Spouse name: N/A  . Number of children: N/A  . Years of education: N/A   Social History Main Topics  . Smoking status: Former Smoker    Quit date: 05/31/1989  . Smokeless tobacco: Never Used     Comment: 08/15/2016 "hadn't smoked a carton of cigarettes all my life"  . Alcohol use 0.0 oz/week     Comment: 08/15/2016 "did have 6-8 beers per day, has not had any since end of October 2017  . Drug use: No  . Sexual activity: Not Currently   Other Topics Concern  . None   Social History Narrative  .  None     Family History:  The patient's family history includes Heart attack in his father; Heart disease in his brother.   ROS:  Please see the history of present illness.    ROS All other systems reviewed and are negative.   PHYSICAL EXAM:   VS:  BP 124/80   Pulse 72   Ht '5\' 7"'  (1.702 m)   Wt 222 lb (100.7 kg)   BMI 34.77 kg/m    GEN: Well nourished, well developed, in no acute distress  HEENT: normal  Neck: no JVD, carotid bruits, or masses Cardiac: RRR; no murmurs, rubs, or gallops,no edema  Respiratory:  clear to auscultation bilaterally, normal work of breathing GI: soft, nontender, nondistended, + BS MS: no deformity or atrophy, right shoulder in sling. Picc line in left arm. Skin: warm and dry, no rash Neuro:  Alert and Oriented x 3, Strength and sensation are intact Psych: euthymic mood, full affect  Wt Readings from Last 3 Encounters:  01/11/17 222 lb (100.7 kg)  12/22/16 223 lb (101.2 kg)  12/06/16 224 lb (101.6 kg)      Studies/Labs Reviewed:   EKG:  EKG is not ordered today.   Recent Labs: 03/25/2016: ALT 15 08/15/2016: B Natriuretic Peptide 29.4 12/22/2016: BUN 16; Creatinine, Ser 1.23; Hemoglobin 10.8; Platelets 233; Potassium 4.0; Sodium 138   Lipid Panel    Component Value Date/Time   CHOL 148 08/16/2016 0504   TRIG 138 08/16/2016 0504   HDL 36 (L) 08/16/2016 0504   CHOLHDL 4.1 08/16/2016 0504   VLDL 28 08/16/2016 0504   LDLCALC 84 08/16/2016 0504    Additional studies/ records that were reviewed today include:   Cath 08/16/2016 Conclusion   1. 3 vessel CAD with moderate calcified stenoses of the mid-LAD/second diagonal, ramus intermedius, ostial and proximal circumflex, and severe stenosis of the second PLA branch of the RCA 2. Normal LV systolic function and normal LVEDP  Recommend: Aggressive medical therapy. Outpatient exercise stress myoview. Consider TCTS evaluation for CABG if Myoview high-risk. Otherwise continue with medical  therapy.  While the LAD/diag only show modest progression compared to 2010 and 2014 cath studies, the left circumflex/intermediate stenoses have progressed.    Diagnostic Diagram         Echo 08/17/2016 LV EF: 55% -   60%  - Left ventricle: The cavity size was normal. There was mild   concentric hypertrophy. Systolic function was normal. The   estimated ejection fraction was in the range of 55% to 60%. Wall   motion was normal; there were no regional wall motion   abnormalities. Features are consistent with a pseudonormal left   ventricular filling pattern, with concomitant abnormal relaxation   and increased filling pressure (grade 2 diastolic dysfunction).   Doppler parameters are consistent with indeterminate ventricular   filling pressure. - Aortic valve: Transvalvular velocity was within the normal range.   There was no stenosis. There was trivial regurgitation. - Mitral valve: Transvalvular velocity was within the normal range.   There was no evidence for stenosis. There was trivial   regurgitation. - Left atrium: The atrium was severely dilated. - Right ventricle: The cavity size was normal. Wall thickness was   normal. Systolic function was normal. - Atrial septum: No defect or patent foramen ovale was identified. - Tricuspid valve: There was trivial regurgitation. - Pulmonary arteries: Systolic pressure was within the normal   range. PA peak pressure: 30 mm Hg (S).    Myoview 08/23/2016 Study Highlights     Nuclear stress EF: 51%. Mildly a synchronous contraction.  There was no ST segment deviation noted during stress. No perfusion defects at stress.  This is a low risk study. No evidence of ischemia identified.      ASSESSMENT:    1. Coronary artery disease involving native coronary artery of native heart with other form of angina pectoris ([place])   2. Essential hypertension   3. Hyperlipidemia, unspecified hyperlipidemia type      PLAN:  In  order of problems listed above:  1. CAD: He is asymptomatic currently. His Myoview was low risk. I personally reviewed his cardiac angiogram and he does have moderate 3 vessel CAD but no critical disease. Will continue medical management.  2. HTN: Blood pressure is now well controlled and side effects resolved with reduction in medication. I will follow up in 6 months  3. HLD: Continue on Lipitor 80 mg daily. Most recent LDL 84 but he hadn't been taking regularly. Reinforced need to take daily. Work on a [employer].   5. History of CVA on plavix.   6. Cervical spine disease. Followed by Dr. Ellene Route. Not really a surgical candidate at this time due to shoulder joint infection.   7. Right shoulder prosthetic infection/abscess. S/p surgical revision. On IV antibiotics.    Medication Adjustments/Labs and Tests Ordered: Current medicines are reviewed at length with the patient today.  Concerns regarding medicines are outlined above.  Medication changes, Labs and Tests ordered today are listed in the Patient Instructions below. Patient Instructions  Continue your current therapy  I will see you in 6 months      Signed, Peter Martinique, MD  01/11/2017 8:27 AM    [facility] [address], [place], [place]  [address] Phone: [phone]; Fax: [phone]

## 2017-01-09 DIAGNOSIS — Z8673 Personal history of transient ischemic attack (TIA), and cerebral infarction without residual deficits: Secondary | ICD-10-CM | POA: Diagnosis not present

## 2017-01-09 DIAGNOSIS — Z452 Encounter for adjustment and management of vascular access device: Secondary | ICD-10-CM | POA: Diagnosis not present

## 2017-01-09 DIAGNOSIS — L02413 Cutaneous abscess of right upper limb: Secondary | ICD-10-CM | POA: Diagnosis not present

## 2017-01-09 DIAGNOSIS — Z7902 Long term (current) use of antithrombotics/antiplatelets: Secondary | ICD-10-CM | POA: Diagnosis not present

## 2017-01-09 DIAGNOSIS — T8459XD Infection and inflammatory reaction due to other internal joint prosthesis, subsequent encounter: Secondary | ICD-10-CM | POA: Diagnosis not present

## 2017-01-10 ENCOUNTER — Telehealth: Payer: Self-pay | Admitting: *Deleted

## 2017-01-10 DIAGNOSIS — Z471 Aftercare following joint replacement surgery: Secondary | ICD-10-CM | POA: Diagnosis not present

## 2017-01-10 DIAGNOSIS — M19011 Primary osteoarthritis, right shoulder: Secondary | ICD-10-CM | POA: Diagnosis not present

## 2017-01-10 DIAGNOSIS — R531 Weakness: Secondary | ICD-10-CM | POA: Diagnosis not present

## 2017-01-10 DIAGNOSIS — M25511 Pain in right shoulder: Secondary | ICD-10-CM | POA: Diagnosis not present

## 2017-01-10 NOTE — Telephone Encounter (Signed)
I spoke with Jeani Hawking at [facility] and will try vancomycin again with benadryl first.  Next option will be daptomycin if needed.

## 2017-01-10 NOTE — Telephone Encounter (Signed)
Received call in triage from Jake Dunn at [facility]. Patient is in their office, wants to know what new antibiotic he will be on. Per [facility] pharmacy, the patient/home health nurse called 9/3 reporting an allergic reaction (red rash on shoulders, back).  He stopped vancomycin on advice from [place], pharmacist at [facility], is showing improvement in rash with benadryl.   Jeani Hawking paged Dr Linus Salmons for new antibiotic orders.  He will coordinate delivery today to patient. Landis Gandy, RN

## 2017-01-11 ENCOUNTER — Ambulatory Visit (INDEPENDENT_AMBULATORY_CARE_PROVIDER_SITE_OTHER): Payer: PPO | Admitting: Cardiology

## 2017-01-11 ENCOUNTER — Encounter: Payer: Self-pay | Admitting: Cardiology

## 2017-01-11 VITALS — BP 124/80 | HR 72 | Ht 67.0 in | Wt 222.0 lb

## 2017-01-11 DIAGNOSIS — I10 Essential (primary) hypertension: Secondary | ICD-10-CM | POA: Diagnosis not present

## 2017-01-11 DIAGNOSIS — I25118 Atherosclerotic heart disease of native coronary artery with other forms of angina pectoris: Secondary | ICD-10-CM

## 2017-01-11 DIAGNOSIS — E785 Hyperlipidemia, unspecified: Secondary | ICD-10-CM | POA: Diagnosis not present

## 2017-01-11 NOTE — Patient Instructions (Signed)
Continue your current therapy  I will see you in 6 months.   

## 2017-01-12 DIAGNOSIS — Z8673 Personal history of transient ischemic attack (TIA), and cerebral infarction without residual deficits: Secondary | ICD-10-CM | POA: Diagnosis not present

## 2017-01-12 DIAGNOSIS — T8459XD Infection and inflammatory reaction due to other internal joint prosthesis, subsequent encounter: Secondary | ICD-10-CM | POA: Diagnosis not present

## 2017-01-12 DIAGNOSIS — L02413 Cutaneous abscess of right upper limb: Secondary | ICD-10-CM | POA: Diagnosis not present

## 2017-01-12 DIAGNOSIS — M19011 Primary osteoarthritis, right shoulder: Secondary | ICD-10-CM | POA: Diagnosis not present

## 2017-01-12 DIAGNOSIS — Z452 Encounter for adjustment and management of vascular access device: Secondary | ICD-10-CM | POA: Diagnosis not present

## 2017-01-12 DIAGNOSIS — Z7902 Long term (current) use of antithrombotics/antiplatelets: Secondary | ICD-10-CM | POA: Diagnosis not present

## 2017-01-16 DIAGNOSIS — Z471 Aftercare following joint replacement surgery: Secondary | ICD-10-CM | POA: Diagnosis not present

## 2017-01-16 DIAGNOSIS — L02413 Cutaneous abscess of right upper limb: Secondary | ICD-10-CM | POA: Diagnosis not present

## 2017-01-16 DIAGNOSIS — Z452 Encounter for adjustment and management of vascular access device: Secondary | ICD-10-CM | POA: Diagnosis not present

## 2017-01-16 DIAGNOSIS — M25511 Pain in right shoulder: Secondary | ICD-10-CM | POA: Diagnosis not present

## 2017-01-16 DIAGNOSIS — M19011 Primary osteoarthritis, right shoulder: Secondary | ICD-10-CM | POA: Diagnosis not present

## 2017-01-16 DIAGNOSIS — R531 Weakness: Secondary | ICD-10-CM | POA: Diagnosis not present

## 2017-01-18 ENCOUNTER — Other Ambulatory Visit: Payer: Self-pay | Admitting: Pharmacist

## 2017-01-19 DIAGNOSIS — Z471 Aftercare following joint replacement surgery: Secondary | ICD-10-CM | POA: Diagnosis not present

## 2017-01-19 DIAGNOSIS — L02413 Cutaneous abscess of right upper limb: Secondary | ICD-10-CM | POA: Diagnosis not present

## 2017-01-19 DIAGNOSIS — M19011 Primary osteoarthritis, right shoulder: Secondary | ICD-10-CM | POA: Diagnosis not present

## 2017-01-19 DIAGNOSIS — R531 Weakness: Secondary | ICD-10-CM | POA: Diagnosis not present

## 2017-01-19 DIAGNOSIS — Z452 Encounter for adjustment and management of vascular access device: Secondary | ICD-10-CM | POA: Diagnosis not present

## 2017-01-19 DIAGNOSIS — M25511 Pain in right shoulder: Secondary | ICD-10-CM | POA: Diagnosis not present

## 2017-01-20 ENCOUNTER — Other Ambulatory Visit: Payer: Self-pay | Admitting: Pharmacist

## 2017-01-23 DIAGNOSIS — L02413 Cutaneous abscess of right upper limb: Secondary | ICD-10-CM | POA: Diagnosis not present

## 2017-01-23 DIAGNOSIS — M25511 Pain in right shoulder: Secondary | ICD-10-CM | POA: Diagnosis not present

## 2017-01-23 DIAGNOSIS — Z471 Aftercare following joint replacement surgery: Secondary | ICD-10-CM | POA: Diagnosis not present

## 2017-01-23 DIAGNOSIS — R531 Weakness: Secondary | ICD-10-CM | POA: Diagnosis not present

## 2017-01-23 DIAGNOSIS — M19011 Primary osteoarthritis, right shoulder: Secondary | ICD-10-CM | POA: Diagnosis not present

## 2017-01-24 ENCOUNTER — Inpatient Hospital Stay: Payer: PPO | Admitting: Internal Medicine

## 2017-01-25 ENCOUNTER — Other Ambulatory Visit: Payer: Self-pay | Admitting: Pharmacist

## 2017-01-26 ENCOUNTER — Telehealth: Payer: Self-pay | Admitting: *Deleted

## 2017-01-26 ENCOUNTER — Ambulatory Visit (INDEPENDENT_AMBULATORY_CARE_PROVIDER_SITE_OTHER): Payer: PPO | Admitting: Internal Medicine

## 2017-01-26 ENCOUNTER — Encounter: Payer: Self-pay | Admitting: Internal Medicine

## 2017-01-26 DIAGNOSIS — Z8673 Personal history of transient ischemic attack (TIA), and cerebral infarction without residual deficits: Secondary | ICD-10-CM | POA: Diagnosis not present

## 2017-01-26 DIAGNOSIS — Z452 Encounter for adjustment and management of vascular access device: Secondary | ICD-10-CM

## 2017-01-26 DIAGNOSIS — Z96619 Presence of unspecified artificial shoulder joint: Secondary | ICD-10-CM | POA: Diagnosis not present

## 2017-01-26 DIAGNOSIS — T8459XD Infection and inflammatory reaction due to other internal joint prosthesis, subsequent encounter: Secondary | ICD-10-CM

## 2017-01-26 DIAGNOSIS — M25511 Pain in right shoulder: Secondary | ICD-10-CM | POA: Diagnosis not present

## 2017-01-26 DIAGNOSIS — R531 Weakness: Secondary | ICD-10-CM | POA: Diagnosis not present

## 2017-01-26 DIAGNOSIS — L02413 Cutaneous abscess of right upper limb: Secondary | ICD-10-CM | POA: Diagnosis not present

## 2017-01-26 DIAGNOSIS — Z471 Aftercare following joint replacement surgery: Secondary | ICD-10-CM | POA: Diagnosis not present

## 2017-01-26 DIAGNOSIS — M19011 Primary osteoarthritis, right shoulder: Secondary | ICD-10-CM | POA: Diagnosis not present

## 2017-01-26 DIAGNOSIS — Z5181 Encounter for therapeutic drug level monitoring: Secondary | ICD-10-CM

## 2017-01-26 DIAGNOSIS — Z7902 Long term (current) use of antithrombotics/antiplatelets: Secondary | ICD-10-CM | POA: Diagnosis not present

## 2017-01-26 MED ORDER — CEPHALEXIN 500 MG PO CAPS: 500.0000 mg | ORAL_CAPSULE | Freq: Four times a day (QID) | ORAL | 5 refills | Status: DC

## 2017-01-26 NOTE — Telephone Encounter (Signed)
Verbal order per Dr. Linus Salmons given to [place] at [facility] to pull patient's picc line on 02/03/17. Myrtis Hopping

## 2017-01-26 NOTE — Progress Notes (Signed)
   Subjective:    Patient ID: Jake Dunn, male    DOB: [birth year], 76 y.o.   MRN: [id number]  HPI Here for follow up of prosthetic shoulder infection I saw him initially following debridement superficially and culture with Propionibacterium/Cutibacterium and I started him on ceftriaxone.  Intially trying penicillin but denied by his insurance.  His shoulder did not improve much and I added vancomycin but still no improvement.  He was admitted in August after seeing my partner, Janene Madeira, who sent him to Dr. Percell Miller for evaluation.  He underwent I and D with polyexchange on 8/16 and gram stain with GPC in pairs, no culture growth.  He now is doing much better and pleased with progress, hoping to go back to work soon.   Latest ESR 24, CRP 2.3, both wnl.   Some rash with vanocmycin but ok with benadryl.   No associated diarrhea.    Review of Systems  Constitutional: Negative for fatigue.  Gastrointestinal: Negative for diarrhea.  Skin:       Some small erythematous papules, no itch.  improved  Neurological: Negative for dizziness.       Objective:   Physical Exam  Constitutional: He appears well-developed and well-nourished. No distress.  HENT:  Mouth/Throat: No oropharyngeal exudate.  Eyes: No scleral icterus.  Cardiovascular: Normal rate, regular rhythm and normal heart sounds.   No murmur heard. Pulmonary/Chest: Effort normal and breath sounds normal. No respiratory distress.  Musculoskeletal:  Shoulder with closed incision, no warmth, no erythema   SH: no tobacco       Assessment & Plan:

## 2017-01-26 NOTE — Assessment & Plan Note (Signed)
Improved and good inflammatory markers.  With a polyexchange, he will complete IV vancomycin on 9/28 and I will transition him to keflex 500 mg qid for 3-6 months.   Prescription sent and rtc 4 weeks to recheck ESR, CRP

## 2017-01-26 NOTE — Assessment & Plan Note (Signed)
No issues.  HH will remove at the end of treatment

## 2017-01-26 NOTE — Assessment & Plan Note (Signed)
Small rash assoicated with vancomycin but no itching and improved with Benadryl.  No hives, no sob.  No diarrhea.

## 2017-01-27 ENCOUNTER — Other Ambulatory Visit: Payer: Self-pay | Admitting: Pharmacist

## 2017-01-30 DIAGNOSIS — M19011 Primary osteoarthritis, right shoulder: Secondary | ICD-10-CM | POA: Diagnosis not present

## 2017-01-30 DIAGNOSIS — Z7902 Long term (current) use of antithrombotics/antiplatelets: Secondary | ICD-10-CM | POA: Diagnosis not present

## 2017-01-30 DIAGNOSIS — Z452 Encounter for adjustment and management of vascular access device: Secondary | ICD-10-CM | POA: Diagnosis not present

## 2017-01-30 DIAGNOSIS — L02413 Cutaneous abscess of right upper limb: Secondary | ICD-10-CM | POA: Diagnosis not present

## 2017-01-30 DIAGNOSIS — Z471 Aftercare following joint replacement surgery: Secondary | ICD-10-CM | POA: Diagnosis not present

## 2017-01-30 DIAGNOSIS — T8459XD Infection and inflammatory reaction due to other internal joint prosthesis, subsequent encounter: Secondary | ICD-10-CM | POA: Diagnosis not present

## 2017-01-30 DIAGNOSIS — Z8673 Personal history of transient ischemic attack (TIA), and cerebral infarction without residual deficits: Secondary | ICD-10-CM | POA: Diagnosis not present

## 2017-01-30 DIAGNOSIS — R531 Weakness: Secondary | ICD-10-CM | POA: Diagnosis not present

## 2017-01-30 DIAGNOSIS — M25511 Pain in right shoulder: Secondary | ICD-10-CM | POA: Diagnosis not present

## 2017-01-31 DIAGNOSIS — M19011 Primary osteoarthritis, right shoulder: Secondary | ICD-10-CM | POA: Diagnosis not present

## 2017-02-02 DIAGNOSIS — M19011 Primary osteoarthritis, right shoulder: Secondary | ICD-10-CM | POA: Diagnosis not present

## 2017-02-02 DIAGNOSIS — M25511 Pain in right shoulder: Secondary | ICD-10-CM | POA: Diagnosis not present

## 2017-02-02 DIAGNOSIS — R531 Weakness: Secondary | ICD-10-CM | POA: Diagnosis not present

## 2017-02-02 DIAGNOSIS — Z471 Aftercare following joint replacement surgery: Secondary | ICD-10-CM | POA: Diagnosis not present

## 2017-02-03 DIAGNOSIS — L02413 Cutaneous abscess of right upper limb: Secondary | ICD-10-CM | POA: Diagnosis not present

## 2017-02-03 DIAGNOSIS — Z452 Encounter for adjustment and management of vascular access device: Secondary | ICD-10-CM | POA: Diagnosis not present

## 2017-02-03 DIAGNOSIS — Z8673 Personal history of transient ischemic attack (TIA), and cerebral infarction without residual deficits: Secondary | ICD-10-CM | POA: Diagnosis not present

## 2017-02-03 DIAGNOSIS — T8459XD Infection and inflammatory reaction due to other internal joint prosthesis, subsequent encounter: Secondary | ICD-10-CM | POA: Diagnosis not present

## 2017-02-03 DIAGNOSIS — Z7902 Long term (current) use of antithrombotics/antiplatelets: Secondary | ICD-10-CM | POA: Diagnosis not present

## 2017-02-06 DIAGNOSIS — Z471 Aftercare following joint replacement surgery: Secondary | ICD-10-CM | POA: Diagnosis not present

## 2017-02-06 DIAGNOSIS — M25511 Pain in right shoulder: Secondary | ICD-10-CM | POA: Diagnosis not present

## 2017-02-06 DIAGNOSIS — M19011 Primary osteoarthritis, right shoulder: Secondary | ICD-10-CM | POA: Diagnosis not present

## 2017-02-06 DIAGNOSIS — R531 Weakness: Secondary | ICD-10-CM | POA: Diagnosis not present

## 2017-02-08 ENCOUNTER — Other Ambulatory Visit: Payer: Self-pay | Admitting: Pharmacist

## 2017-02-09 DIAGNOSIS — R531 Weakness: Secondary | ICD-10-CM | POA: Diagnosis not present

## 2017-02-09 DIAGNOSIS — M25511 Pain in right shoulder: Secondary | ICD-10-CM | POA: Diagnosis not present

## 2017-02-09 DIAGNOSIS — M19011 Primary osteoarthritis, right shoulder: Secondary | ICD-10-CM | POA: Diagnosis not present

## 2017-02-09 DIAGNOSIS — Z471 Aftercare following joint replacement surgery: Secondary | ICD-10-CM | POA: Diagnosis not present

## 2017-02-22 DIAGNOSIS — R55 Syncope and collapse: Secondary | ICD-10-CM | POA: Diagnosis not present

## 2017-02-22 DIAGNOSIS — R404 Transient alteration of awareness: Secondary | ICD-10-CM | POA: Diagnosis not present

## 2017-03-01 ENCOUNTER — Ambulatory Visit (INDEPENDENT_AMBULATORY_CARE_PROVIDER_SITE_OTHER): Payer: PPO | Admitting: Internal Medicine

## 2017-03-01 ENCOUNTER — Encounter: Payer: Self-pay | Admitting: Internal Medicine

## 2017-03-01 VITALS — BP 158/77 | HR 65 | Temp 97.7°F | Wt 230.0 lb

## 2017-03-01 DIAGNOSIS — Z5181 Encounter for therapeutic drug level monitoring: Secondary | ICD-10-CM

## 2017-03-01 DIAGNOSIS — Z452 Encounter for adjustment and management of vascular access device: Secondary | ICD-10-CM | POA: Diagnosis not present

## 2017-03-01 DIAGNOSIS — Z789 Other specified health status: Secondary | ICD-10-CM | POA: Diagnosis not present

## 2017-03-01 DIAGNOSIS — T8459XD Infection and inflammatory reaction due to other internal joint prosthesis, subsequent encounter: Secondary | ICD-10-CM

## 2017-03-01 DIAGNOSIS — Z96619 Presence of unspecified artificial shoulder joint: Secondary | ICD-10-CM | POA: Diagnosis not present

## 2017-03-01 MED ORDER — AMOXICILLIN 500 MG PO TABS: 500.0000 mg | ORAL_TABLET | Freq: Three times a day (TID) | ORAL | 1 refills | Status: DC

## 2017-03-01 NOTE — Assessment & Plan Note (Signed)
This has been removed.  No issues since.

## 2017-03-01 NOTE — Assessment & Plan Note (Signed)
Will check cmp on antibiotics

## 2017-03-01 NOTE — Progress Notes (Signed)
   Subjective:    Patient ID: Jake Dunn, male    DOB: 07-04-1940, 76 y.o.   MRN: [id number]  HPI Here for follow up of prosthetic shoulder infection I saw him initially following debridement superficially and culture with Propionibacterium/Cutibacterium and I started him on ceftriaxone.  Intially trying penicillin but denied by his insurance.  His shoulder did not improve much and I added vancomycin but still no improvement.  He was admitted in August after seeing my partner, Janene Madeira, who sent him to Dr. Percell Miller for evaluation.  He underwent I and D with polyexchange on 8/16 and gram stain with GPC in pairs, no culture growth.  Last ESR 24, CRP 2.3, both wnl.    After 6 weeks of IV post surgery he started on Keflex oral for 3-6 months.  His shoulder continues to do well and going to rehab.  No shoulder swelling or pain, some soreness.  He is having poor po, abdominal discomfort on the Keflex.  No diarreha, no rash.  No associated weight loss.    Review of Systems  Constitutional: Negative for fatigue.  Gastrointestinal: Negative for diarrhea.  Skin: Negative for rash.  Neurological: Negative for dizziness.       Objective:   Physical Exam  Constitutional: He appears well-developed and well-nourished. No distress.  HENT:  Mouth/Throat: No oropharyngeal exudate.  Eyes: No scleral icterus.  Cardiovascular: Normal rate, regular rhythm and normal heart sounds.   No murmur heard. Pulmonary/Chest: Effort normal and breath sounds normal. No respiratory distress.  Musculoskeletal:  Shoulder with closed incision, no warmth, no erythema   SH: no tobacco       Assessment & Plan:

## 2017-03-01 NOTE — Assessment & Plan Note (Signed)
Stable.  I will check ESR and CRP again today.  If ok, rtc 2 months and consider stopping antibiotics then after repeat labs if reassuring then as well.

## 2017-03-01 NOTE — Assessment & Plan Note (Signed)
With culture growth of Proprianobacterium and GPC in pairs on gs, I will change him to amoxicillin for 2 months. He will call if he has any issues with it.

## 2017-03-02 ENCOUNTER — Telehealth: Payer: Self-pay | Admitting: *Deleted

## 2017-03-02 DIAGNOSIS — Z471 Aftercare following joint replacement surgery: Secondary | ICD-10-CM | POA: Diagnosis not present

## 2017-03-02 DIAGNOSIS — M19011 Primary osteoarthritis, right shoulder: Secondary | ICD-10-CM | POA: Diagnosis not present

## 2017-03-02 DIAGNOSIS — R531 Weakness: Secondary | ICD-10-CM | POA: Diagnosis not present

## 2017-03-02 DIAGNOSIS — M25511 Pain in right shoulder: Secondary | ICD-10-CM | POA: Diagnosis not present

## 2017-03-02 LAB — CBC WITH DIFFERENTIAL/PLATELET
BASOS ABS: 48 {cells}/uL (ref 0–200)
Basophils Relative: 0.7 %
EOS ABS: 283 {cells}/uL (ref 15–500)
Eosinophils Relative: 4.1 %
HEMATOCRIT: 33.8 % — AB (ref 38.5–50.0)
HEMOGLOBIN: 11.1 g/dL — AB (ref 13.2–17.1)
LYMPHS ABS: 1359 {cells}/uL (ref 850–3900)
MCH: 28.1 pg (ref 27.0–33.0)
MCHC: 32.8 g/dL (ref 32.0–36.0)
MCV: 85.6 fL (ref 80.0–100.0)
MPV: 10.6 fL (ref 7.5–12.5)
Monocytes Relative: 13.9 %
NEUTROS ABS: 4250 {cells}/uL (ref 1500–7800)
NEUTROS PCT: 61.6 %
Platelets: 246 10*3/uL (ref 140–400)
RBC: 3.95 10*6/uL — ABNORMAL LOW (ref 4.20–5.80)
RDW: 13.8 % (ref 11.0–15.0)
Total Lymphocyte: 19.7 %
WBC mixed population: 959 cells/uL — ABNORMAL HIGH (ref 200–950)
WBC: 6.9 10*3/uL (ref 3.8–10.8)

## 2017-03-02 LAB — COMPREHENSIVE METABOLIC PANEL
AG Ratio: 1.4 (calc) (ref 1.0–2.5)
ALKALINE PHOSPHATASE (APISO): 86 U/L (ref 40–115)
ALT: 11 U/L (ref 9–46)
AST: 13 U/L (ref 10–35)
Albumin: 4 g/dL (ref 3.6–5.1)
BUN / CREAT RATIO: 16 (calc) (ref 6–22)
BUN: 24 mg/dL (ref 7–25)
CO2: 24 mmol/L (ref 20–32)
CREATININE: 1.54 mg/dL — AB (ref 0.70–1.18)
Calcium: 9 mg/dL (ref 8.6–10.3)
Chloride: 105 mmol/L (ref 98–110)
GLUCOSE: 103 mg/dL — AB (ref 65–99)
Globulin: 2.8 g/dL (calc) (ref 1.9–3.7)
Potassium: 4.7 mmol/L (ref 3.5–5.3)
Sodium: 138 mmol/L (ref 135–146)
Total Bilirubin: 0.3 mg/dL (ref 0.2–1.2)
Total Protein: 6.8 g/dL (ref 6.1–8.1)

## 2017-03-02 LAB — SEDIMENTATION RATE: Sed Rate: 22 mm/h — ABNORMAL HIGH (ref 0–20)

## 2017-03-02 LAB — C-REACTIVE PROTEIN: CRP: 2.4 mg/L (ref <8.0)

## 2017-03-02 NOTE — Telephone Encounter (Signed)
Patient notified

## 2017-03-02 NOTE — Telephone Encounter (Signed)
-----  Message from Thayer Headings, MD sent at 03/02/2017  7:56 AM EDT ----- Please let him know his labs look good - ESR, CrP - wnl.   thanks

## 2017-03-14 DIAGNOSIS — F419 Anxiety disorder, unspecified: Secondary | ICD-10-CM | POA: Diagnosis not present

## 2017-03-14 DIAGNOSIS — G894 Chronic pain syndrome: Secondary | ICD-10-CM | POA: Diagnosis not present

## 2017-03-14 DIAGNOSIS — M109 Gout, unspecified: Secondary | ICD-10-CM | POA: Diagnosis not present

## 2017-03-14 DIAGNOSIS — I10 Essential (primary) hypertension: Secondary | ICD-10-CM | POA: Diagnosis not present

## 2017-03-14 DIAGNOSIS — E785 Hyperlipidemia, unspecified: Secondary | ICD-10-CM | POA: Diagnosis not present

## 2017-03-14 DIAGNOSIS — I2510 Atherosclerotic heart disease of native coronary artery without angina pectoris: Secondary | ICD-10-CM | POA: Diagnosis not present

## 2017-03-14 DIAGNOSIS — M19011 Primary osteoarthritis, right shoulder: Secondary | ICD-10-CM | POA: Diagnosis not present

## 2017-03-26 ENCOUNTER — Other Ambulatory Visit: Payer: Self-pay | Admitting: Physician Assistant

## 2017-03-27 NOTE — Telephone Encounter (Signed)
Please review for refill, Thanks !  

## 2017-03-29 DIAGNOSIS — M25532 Pain in left wrist: Secondary | ICD-10-CM | POA: Diagnosis not present

## 2017-03-29 DIAGNOSIS — M19011 Primary osteoarthritis, right shoulder: Secondary | ICD-10-CM | POA: Diagnosis not present

## 2017-04-05 DIAGNOSIS — M19011 Primary osteoarthritis, right shoulder: Secondary | ICD-10-CM | POA: Diagnosis not present

## 2017-04-08 DIAGNOSIS — J45901 Unspecified asthma with (acute) exacerbation: Secondary | ICD-10-CM | POA: Diagnosis not present

## 2017-04-27 DIAGNOSIS — M4712 Other spondylosis with myelopathy, cervical region: Secondary | ICD-10-CM | POA: Diagnosis not present

## 2017-05-05 DIAGNOSIS — M19011 Primary osteoarthritis, right shoulder: Secondary | ICD-10-CM | POA: Diagnosis not present

## 2017-05-10 ENCOUNTER — Ambulatory Visit (INDEPENDENT_AMBULATORY_CARE_PROVIDER_SITE_OTHER): Payer: Medicare HMO | Admitting: Internal Medicine

## 2017-05-10 ENCOUNTER — Encounter: Payer: Self-pay | Admitting: Internal Medicine

## 2017-05-10 DIAGNOSIS — Z789 Other specified health status: Secondary | ICD-10-CM | POA: Diagnosis not present

## 2017-05-10 DIAGNOSIS — Z96619 Presence of unspecified artificial shoulder joint: Secondary | ICD-10-CM | POA: Diagnosis not present

## 2017-05-10 DIAGNOSIS — T8459XD Infection and inflammatory reaction due to other internal joint prosthesis, subsequent encounter: Secondary | ICD-10-CM

## 2017-05-10 NOTE — Assessment & Plan Note (Signed)
He continues to do well and about to complete 6 months of oral continuation treatment and clinically resolved.  Inflammatory markers have been reassuring.   At the end of this month, he will stop antibiotics  He knows to call with any concerns

## 2017-05-10 NOTE — Progress Notes (Signed)
   Subjective:    Patient ID: Jake Dunn, male    DOB: 1940/07/19, 76 y.o.   MRN: [id number]  HPI Here for follow up of prosthetic shoulder infection I saw him initially following debridement superficially and culture with Propionibacterium/Cutibacterium and I started him on ceftriaxone.  Intially trying penicillin but denied by his insurance.  His shoulder did not improve much and I added vancomycin but still no improvement.  He was admitted in August after seeing my partner, Janene Madeira, who sent him to Dr. Percell Miller for evaluation.  He underwent I and D with polyexchange on 8/16 and gram stain with GPC in pairs, no culture growth.  Last ESR 22, CRP 2.4, both wnl.    After 6 weeks of IV post surgery he started on Keflex oral for 3-6 months.  His shoulder continues to do well and he continues to be very pleased with the progress.  No pain in his shoulder.  No fever or chills.  No associated diarrhea, rash.     Review of Systems  Constitutional: Negative for fatigue.  Gastrointestinal: Negative for diarrhea.  Skin: Negative for rash.  Neurological: Negative for dizziness.       Objective:   Physical Exam  Constitutional: He appears well-developed and well-nourished. No distress.  HENT:  Mouth/Throat: No oropharyngeal exudate.  Eyes: No scleral icterus.  Cardiovascular: Normal rate, regular rhythm and normal heart sounds.  No murmur heard. Pulmonary/Chest: Effort normal and breath sounds normal. No respiratory distress.  Musculoskeletal:  Shoulder with healed incision, no warmth, no tenderness   SH: no tobacco       Assessment & Plan:

## 2017-05-10 NOTE — Assessment & Plan Note (Signed)
He has had no issues with keflex and has not missed doses.

## 2017-06-14 DIAGNOSIS — M19011 Primary osteoarthritis, right shoulder: Secondary | ICD-10-CM | POA: Diagnosis not present

## 2017-06-15 DIAGNOSIS — R071 Chest pain on breathing: Secondary | ICD-10-CM | POA: Diagnosis not present

## 2017-06-15 DIAGNOSIS — I10 Essential (primary) hypertension: Secondary | ICD-10-CM | POA: Diagnosis not present

## 2017-06-15 DIAGNOSIS — G894 Chronic pain syndrome: Secondary | ICD-10-CM | POA: Diagnosis not present

## 2017-06-15 DIAGNOSIS — R69 Illness, unspecified: Secondary | ICD-10-CM | POA: Diagnosis not present

## 2017-06-15 DIAGNOSIS — E785 Hyperlipidemia, unspecified: Secondary | ICD-10-CM | POA: Diagnosis not present

## 2017-06-21 ENCOUNTER — Ambulatory Visit
Admission: RE | Admit: 2017-06-21 | Discharge: 2017-06-21 | Disposition: A | Discharge status: Home / Self Care | Payer: Medicare HMO | Source: Ambulatory Visit | Attending: Family Medicine | Admitting: Family Medicine

## 2017-06-21 ENCOUNTER — Other Ambulatory Visit: Payer: Self-pay | Admitting: Family Medicine

## 2017-06-21 DIAGNOSIS — R071 Chest pain on breathing: Secondary | ICD-10-CM

## 2017-06-21 DIAGNOSIS — R079 Chest pain, unspecified: Secondary | ICD-10-CM | POA: Diagnosis not present

## 2017-07-17 NOTE — Progress Notes (Signed)
Cardiology Office Note    Date:  07/21/2017   ID:  [name], [name] 07-Nov-1940, MRN [id number]  PCP:  Harlan Stains, MD  Cardiologist:  Dr. Raekwon Winkowski Martinique  Chief Complaint  Patient presents with  . Follow-up    6 months  . Headache  . Shortness of Breath  . Chest Pain  . Edema    Hands swell and legs feel tight.    History of Present Illness:  Jake Dunn is a 77 y.o. male seen for post hospital follow up. He has a  PMH of nonobstructive CAD in the past (70% 3rd diag '14), GERD, HTN, HL, renal cell CA s/p left nephrectomy. Last cardiac catheterization in 2014 showed nonobstructive CAD with 70% lesion in third diagonal that was treated medically. He is fairly active and recently undergoing physical therapy after his right shoulder surgery 5 months ago. He presented with left sided chest pressure on 08/15/2016 while at Urology office. BP was quite high that day. He underwent cardiac catheterization on 08/16/2016, this revealed two-vessel CAD with moderate calcified stenosis in the mid LAD, second diagonal, ramus intermedius, ostial and proximal left circumflex and a severe stenosis of the second PLA branch of the RCA. Overall normal LVEDP and normal LVEF. Recommended  medical therapy and outpatient exercise stress Myoview. While the LAD and the diagonal only showed moderate progression compared to the 2010/2014 studies, the left circumflex and intermediate stenosis had progressed. Echocardiogram obtained on 08/17/2016 showed EF 62-22%, grade 2 diastolic dysfunction, severely dilated left atrium, PA peak pressure 30 mmHg. Lexiscan Myoview obtained on 08/23/2016 showed EF 51%, no perfusion defect at stress, overall low risk study.   When seen April 20th his Imdur was increased and amlodipine was added. Afterwards  patient states he could not function. He felt very fatigued and lightheaded. He felt drunk all the time.  In the past he has had problems with aggressive BP control with orthostatic  hypotension and syncope. His amlodipine was discontinued due to low BP and symptoms. He states he also reduced his metoprolol to once a day. Afterwards he felt much better. Fatigue and lightheadedness have resolved. BP at home has been excellent. He does have a lot of neck pain radiating to his shoulders and into his chest. He had an MRI  showing significant cervical disease. He is followed by Dr. Ellene Route.   In June 2018 he was admitted with abscess of his right shoulder (previous shoulder replacement). He underwent I&D of abscess. In August he returned with unremitting pain in right shoulder with infected joint. He underwent surgery with glenoid and humeral component revision and drainage of recurrent infection. He had a PICC line placed is receiving outpatient IV antibiotics.   On follow up today he is doing much better from his shoulder. No pain or fever. He denies any chest pain. Still feels SOB often. He has been walking. He had one episode of blacking out 5 weeks ago when he got up to kitchen. EMS called and noted BP a little low. Other times he does feel lightheaded. He states Dr. Ellene Route is going to check his spine with an MRI in June.   Past Medical History:  Diagnosis Date  . Adenomatous polyp   . Anxiety   . Arthritis    "knees, ankles, shoulders" (08/15/2016)  . CAD (coronary artery disease)    s/p cath in 2014 showing significant stenosis in the diagonal side branches and in the RV marginal branch. These vessels were small  and diffusely diseased. He is managed medically. 08/16/16 Cath, no disease progression  . Chronic lower back pain   . Disc disease, degenerative, cervical   . GERD (gastroesophageal reflux disease)   . Gout   . Hyperlipidemia   . Hypertension   . Osteoarthritis   . Renal cell carcinoma    left kidney removal  . Stroke [facility])    MINI STROKE 15+ YRS AGO, no residual  . Stroke ([place])    "I've had 2 or 3"; denies residual on 08/15/2016  . Syncope and collapse     Past  Surgical History:  Procedure Laterality Date  . ANTERIOR CERVICAL DECOMP/DISCECTOMY FUSION  02/2004; 04/2005   [name] 5/14/2012Marland Kitchen [name] 09/20/2010  . BACK SURGERY    . CARDIAC CATHETERIZATION  12/14/2008   ef 50-55%. SHOWED SIGNIFICANT STENOSIS AND TO DIAGONAL SIDE BRANCHES AND IN THE RIGHT VENTRICULAR MARGINAL BRANCH. THESE VESSELS WERE SMALL AND DIFFUSELY DISEASED  . CARDIOVASCULAR STRESS TEST  12/10/2009   EF 61%. EKG negative. Mild peri ischemia. Managed medically.   . CARPAL TUNNEL RELEASE Right 11/2005   [name] 09/20/2010  . CARPAL TUNNEL RELEASE Left 06/2007   [name] 5/132012  . COLONOSCOPY  11/2004   [name] 09/20/2010  . COLONOSCOPY W/ BIOPSIES AND POLYPECTOMY  09/2002   [name] 09/20/2010  . INCISION AND DRAINAGE ABSCESS Right 10/18/2016   Procedure: INCISION AND DRAINAGE ABSCESS RIGHT SHOULDER;  Surgeon: [name], MD;  Location: [place];  Service: Orthopedics;  Laterality: Right;  . INCISION AND DRAINAGE OF WOUND Right 08/2005   shoulder/notes 09/20/2010  . IR FLUORO GUIDE CV LINE LEFT  10/19/2016  . IR US GUIDE VASC ACCESS LEFT  10/19/2016  . IRRIGATION AND DEBRIDEMENT SHOULDER Right 12/22/2016   Procedure: IRRIGATION AND DEBRIDEMENT RIGHT SHOULDER;  Surgeon: [name], MD;  Location: [place];  Service: Orthopedics;  Laterality: Right;  . JOINT REPLACEMENT    . LEFT HEART CATH AND CORONARY ANGIOGRAPHY N/A 08/16/2016   Procedure: Left Heart Cath and Coronary Angiography;  Surgeon: [name], MD;  Location: [place] [facility];  Service: Cardiovascular;  Laterality: N/A;  . LEFT HEART CATHETERIZATION WITH CORONARY ANGIOGRAM N/A 08/09/2012   Procedure: LEFT HEART CATHETERIZATION WITH CORONARY ANGIOGRAM;  Surgeon: [name], MD;  Location: [facility];  Service: Cardiovascular;  Laterality: N/A;  . LUMBAR FUSION    . NEPHRECTOMY Left early 2000s  . REPLACEMENT TOTAL KNEE Right 08/2008   [name] 09/07/2010  . SHOULDER ARTHROSCOPY WITH ROTATOR CUFF REPAIR  Right 06/2005   [name] 09/20/2010  . TOTAL KNEE ARTHROPLASTY  06/08/2011   Procedure: TOTAL KNEE ARTHROPLASTY;  Surgeon: [name], MD;  Location: [place];  Service: Orthopedics;  Laterality: Left;  . TOTAL SHOULDER ARTHROPLASTY Right 04/06/2016   Procedure: RIGHT REVERSE TOTAL SHOULDER ARTHROPLASTY;  Surgeon: [name], MD;  Location: [place];  Service: Orthopedics;  Laterality: Right;  . TOTAL SHOULDER REVISION Right 12/22/2016   Procedure: RIGHT SHOULDER GLENOID AND HUMERAL COMPONENT REVISION;  Surgeon: [name], MD;  Location: [place];  Service: Orthopedics;  Laterality: Right;  . US ECHOCARDIOGRAPHY  12/15/2008   EF 60-65%  . US ECHOCARDIOGRAPHY  09/28/2004   EF 55-60%    Current Medications: Allergies as of 07/21/2017      Reactions   Adhesive [tape] Rash   Paper tape okay   Latex Rash      Medication List        Accurate as of 07/21/17  9:27 AM. Always use your most recent med list.          acyclovir 400 MG tablet Commonly known as:  ZOVIRAX Take 400 mg by mouth 3 (three) times daily as needed. For cold sores.   allopurinol 300 MG tablet Commonly known as:  ZYLOPRIM Take 300 mg by mouth daily as needed (for gout flares).   aspirin 81 MG EC tablet Take 1 tablet (81 mg total) by mouth daily.   atorvastatin 80 MG tablet Commonly known as:  LIPITOR Take 1 tablet (80 mg total) by mouth every evening.   clopidogrel 75 MG tablet Commonly known as:  PLAVIX Take 75 mg by mouth every evening.   colchicine 0.6 MG tablet Take 0.6 mg by mouth daily as needed (for gout flares.).   EPINEPHrine 0.3 mg/0.3 mL Soaj injection Commonly known as:  EPI-PEN USE AS DIRECTED IF LIFE THREATENING REACTION OCCURS   isosorbide mononitrate 60 MG 24 hr tablet Commonly known as:  IMDUR TAKE 1 TABLET (60 MG TOTAL) BY MOUTH DAILY.   LORazepam 0.5 MG tablet Commonly known as:  ATIVAN Take 0.5 mg by mouth 2 (two) times daily as needed for anxiety.   metoprolol tartrate 50 MG  tablet Commonly known as:  LOPRESSOR Take 1 tablet (50 mg total) by mouth daily.   nitroGLYCERIN 0.4 MG SL tablet Commonly known as:  NITROSTAT Place 1 tablet (0.4 mg total) under the tongue every 5 (five) minutes x 3 doses as needed for chest pain.   olmesartan 20 MG tablet Commonly known as:  BENICAR Take 20 mg by mouth daily.   pantoprazole 40 MG tablet Commonly known as:  PROTONIX Take 40 mg by mouth every evening.        Allergies:   Adhesive [tape] and Latex   Social History   Socioeconomic History  . Marital status: Married    Spouse name: None  . Number of children: None  . Years of education: None  . Highest education level: None  Social Needs  . Financial resource strain: None  . Food insecurity - worry: None  . Food insecurity - inability: None  . Transportation needs - medical: None  . Transportation needs - non-medical: None  Occupational History  . None  Tobacco Use  . Smoking status: Former Smoker    Last attempt to quit: 05/31/1989    Years since quitting: 28.1  . Smokeless tobacco: Never Used  . Tobacco comment: 08/15/2016 "hadn't smoked a carton of cigarettes all my life"  Substance and Sexual Activity  . Alcohol use: Yes    Alcohol/week: 0.0 oz    Comment: 08/15/2016 "did have 6-8 beers per day, has not had any since end of October 2017  . Drug use: No  . Sexual activity: Not Currently  Other Topics Concern  . None  Social History Narrative  . None     Family History:  The patient's family history includes Heart attack in his father; Heart disease in his brother.   ROS:   Please see the history of present illness.    ROS All other systems reviewed and are negative.   PHYSICAL EXAM:   VS:  BP (!) 148/82   Pulse 74   Ht 5\' 7"  (1.702 m)   Wt 234 lb (106.1 kg)   BMI 36.65 kg/m    GENERAL:  Well appearing WM.  HEENT:  PERRL, EOMI, sclera are clear. Oropharynx is clear. NECK:  No jugular venous distention, carotid upstroke brisk and  symmetric, no bruits, no thyromegaly or adenopathy LUNGS:  Clear to auscultation bilaterally CHEST:  Unremarkable HEART:  RRR,  PMI not displaced or sustained,S1 and S2 within normal limits, no S3, no S4: no clicks, no rubs, no murmurs ABD:  Soft, nontender. BS +, no masses or bruits. No hepatomegaly, no splenomegaly EXT:  2 + pulses throughout, no edema, no cyanosis no clubbing SKIN:  Warm and dry.  No rashes NEURO:  Alert and oriented x 3. Cranial nerves II through XII intact. PSYCH:  Cognitively intact    Wt Readings from Last 3 Encounters:  07/21/17 234 lb (106.1 kg)  05/10/17 234 lb (106.1 kg)  03/01/17 230 lb (104.3 kg)      Studies/Labs Reviewed:   EKG:  EKG is  ordered today. NSR rate 74, normal Ecg. I have personally reviewed and interpreted this study.    Recent Labs: 08/15/2016: B Natriuretic Peptide 29.4 03/01/2017: ALT 11; BUN 24; Creat 1.54; Hemoglobin 11.1; Platelets 246; Potassium 4.7; Sodium 138   Lipid Panel    Component Value Date/Time   CHOL 148 08/16/2016 0504   TRIG 138 08/16/2016 0504   HDL 36 (L) 08/16/2016 0504   CHOLHDL 4.1 08/16/2016 0504   VLDL 28 08/16/2016 0504   LDLCALC 84 08/16/2016 0504   Dated 03/14/17: cholesterol 106, triglycerides 127, HDL 43, LDL 38. Hgb 12.2. Creatinine 1.63. Other chemistries and TSH normal.   Additional studies/ records that were reviewed today include:   Cath 08/16/2016 Conclusion   1. 3 vessel CAD with moderate calcified stenoses of the mid-LAD/second diagonal, ramus intermedius, ostial and proximal circumflex, and severe stenosis of the second PLA branch of the RCA 2. Normal LV systolic function and normal LVEDP  Recommend: Aggressive medical therapy. Outpatient exercise stress myoview. Consider TCTS evaluation for CABG if Myoview high-risk. Otherwise continue with medical therapy.  While the LAD/diag only show modest progression compared to 2010 and 2014 cath studies, the left circumflex/intermediate  stenoses have progressed.    Diagnostic Diagram         Echo 08/17/2016 LV EF: 55% -   60%  - Left ventricle: The cavity size was normal. There was mild   concentric hypertrophy. Systolic function was normal. The   estimated ejection fraction was in the range of 55% to 60%. Wall   motion was normal; there were no regional wall motion   abnormalities. Features are consistent with a pseudonormal left   ventricular filling pattern, with concomitant abnormal relaxation   and increased filling pressure (grade 2 diastolic dysfunction).   Doppler parameters are consistent with indeterminate ventricular   filling pressure. - Aortic valve: Transvalvular velocity was within the normal range.   There was no stenosis. There was trivial regurgitation. - Mitral valve: Transvalvular velocity was within the normal range.   There was no evidence for stenosis. There was trivial   regurgitation. - Left atrium: The atrium was severely dilated. - Right ventricle: The cavity size was normal. Wall thickness was   normal. Systolic function was normal. - Atrial septum: No defect or patent foramen ovale was identified. - Tricuspid valve: There was trivial regurgitation. - Pulmonary arteries: Systolic pressure was within the normal   range. PA peak pressure: 30 mm Hg (S).    Myoview 08/23/2016 Study Highlights     Nuclear stress EF: 51%. Mildly a synchronous contraction.  There was no ST segment deviation noted during stress. No perfusion defects at stress.  This is a low risk study. No evidence of ischemia  identified.      ASSESSMENT:    1. Coronary artery disease of native artery of native heart with stable angina pectoris ([place])   2. Essential hypertension   3. Pure hypercholesterolemia   4. Syncope, unspecified syncope type      PLAN:  In order of problems listed above:  1. CAD:  His Myoview was low risk. He does have moderate 3 vessel CAD but no critical disease. Will continue  medical management. Continue to encourage regular aerobic exercise.   2. HTN: Blood pressure is now under reasonable control. Still have to balance this with symptoms of orthostatic dizziness and syncope.  I will follow up in 6 months  3. HLD: Continue on Lipitor 80 mg daily. Excellent control.   4.  History of CVA on plavix.   5.  Cervical spine disease. Followed by Dr. Ellene Route. Plan MRI in June  6. Right shoulder prosthetic infection/abscess. S/p surgical revision. Infection resolved.    Medication Adjustments/Labs and Tests Ordered: Current medicines are reviewed at length with the patient today.  Concerns regarding medicines are outlined above.  Medication changes, Labs and Tests ordered today are listed in the Patient Instructions below. Patient Instructions  Continue your current therapy  I will see you in 6 months.    Signed, Azia Toutant Martinique, MD  07/21/2017 9:27 AM    [facility] [address], [place], [place]  [address] Phone: [phone]; Fax: [phone]

## 2017-07-21 ENCOUNTER — Ambulatory Visit: Payer: Medicare HMO | Admitting: Cardiology

## 2017-07-21 ENCOUNTER — Encounter: Payer: Self-pay | Admitting: Cardiology

## 2017-07-21 VITALS — BP 148/82 | HR 74 | Ht 67.0 in | Wt 234.0 lb

## 2017-07-21 DIAGNOSIS — I10 Essential (primary) hypertension: Secondary | ICD-10-CM | POA: Diagnosis not present

## 2017-07-21 DIAGNOSIS — I25118 Atherosclerotic heart disease of native coronary artery with other forms of angina pectoris: Secondary | ICD-10-CM | POA: Diagnosis not present

## 2017-07-21 DIAGNOSIS — E7800 Pure hypercholesterolemia, unspecified: Secondary | ICD-10-CM

## 2017-07-21 DIAGNOSIS — R55 Syncope and collapse: Secondary | ICD-10-CM | POA: Diagnosis not present

## 2017-07-21 NOTE — Patient Instructions (Signed)
Continue your current therapy  I will see you in 6 months.   

## 2017-08-15 ENCOUNTER — Encounter: Payer: Self-pay | Admitting: Internal Medicine

## 2017-08-16 DIAGNOSIS — N5201 Erectile dysfunction due to arterial insufficiency: Secondary | ICD-10-CM | POA: Diagnosis not present

## 2017-08-16 DIAGNOSIS — R3915 Urgency of urination: Secondary | ICD-10-CM | POA: Diagnosis not present

## 2017-09-26 DIAGNOSIS — I2510 Atherosclerotic heart disease of native coronary artery without angina pectoris: Secondary | ICD-10-CM | POA: Diagnosis not present

## 2017-09-26 DIAGNOSIS — I10 Essential (primary) hypertension: Secondary | ICD-10-CM | POA: Diagnosis not present

## 2017-09-26 DIAGNOSIS — E785 Hyperlipidemia, unspecified: Secondary | ICD-10-CM | POA: Diagnosis not present

## 2017-09-26 DIAGNOSIS — R69 Illness, unspecified: Secondary | ICD-10-CM | POA: Diagnosis not present

## 2017-09-26 DIAGNOSIS — G894 Chronic pain syndrome: Secondary | ICD-10-CM | POA: Diagnosis not present

## 2017-10-19 DIAGNOSIS — M21379 Foot drop, unspecified foot: Secondary | ICD-10-CM | POA: Insufficient documentation

## 2017-10-19 DIAGNOSIS — M21371 Foot drop, right foot: Secondary | ICD-10-CM | POA: Diagnosis not present

## 2017-10-24 DIAGNOSIS — M21371 Foot drop, right foot: Secondary | ICD-10-CM | POA: Diagnosis not present

## 2017-10-24 DIAGNOSIS — M48062 Spinal stenosis, lumbar region with neurogenic claudication: Secondary | ICD-10-CM | POA: Diagnosis not present

## 2017-10-24 DIAGNOSIS — Z6836 Body mass index (BMI) 36.0-36.9, adult: Secondary | ICD-10-CM | POA: Diagnosis not present

## 2017-10-24 DIAGNOSIS — M48061 Spinal stenosis, lumbar region without neurogenic claudication: Secondary | ICD-10-CM | POA: Diagnosis not present

## 2017-10-24 DIAGNOSIS — I10 Essential (primary) hypertension: Secondary | ICD-10-CM | POA: Diagnosis not present

## 2017-11-14 DIAGNOSIS — M5136 Other intervertebral disc degeneration, lumbar region: Secondary | ICD-10-CM | POA: Diagnosis not present

## 2017-11-27 ENCOUNTER — Other Ambulatory Visit: Payer: Self-pay | Admitting: Neurological Surgery

## 2017-11-28 ENCOUNTER — Other Ambulatory Visit (HOSPITAL_COMMUNITY): Payer: Self-pay | Admitting: Neurological Surgery

## 2017-11-28 ENCOUNTER — Telehealth: Payer: Self-pay

## 2017-11-28 DIAGNOSIS — M48062 Spinal stenosis, lumbar region with neurogenic claudication: Secondary | ICD-10-CM

## 2017-11-28 NOTE — Telephone Encounter (Signed)
   [facility] Pre-operative Risk Assessment    Request for surgical clearance:  1. What type of surgery is being performed? Lumbar 2-3 Posterior Lumbar interbody fusion with Mazor   2. When is this surgery scheduled? 01/09/18   3. What type of clearance is required (medical clearance vs. Pharmacy clearance to hold med vs. Both)? Both  4. Are there any medications that need to be held prior to surgery and how long? Plavix    5. Practice name and name of physician performing surgery? [facility]  6. What is your office phone number [phone]    7.   What is your office fax number [phone]  8.   Anesthesia type (None, local, MAC, general) ? Unknown   Meryl Crutch 11/28/2017, 8:48 AM  _________________________________________________________________   (provider comments below)

## 2017-12-01 ENCOUNTER — Other Ambulatory Visit: Payer: Self-pay | Admitting: Physician Assistant

## 2017-12-01 NOTE — Telephone Encounter (Signed)
Rx request sent to pharmacy.  

## 2017-12-01 NOTE — Telephone Encounter (Signed)
Dr Martinique I spoke with Mr Ketelsen today, he is doing well, no angina. He tells me he has been on Plavix for some time, he believes you started it some years ago for CAD. Is it OK to hold 5-7 days pre op back surgery?-Please respond to CV DIV PRE OP.  Thanks  Kerin Ransom PA-C 12/01/2017 9:10 AM

## 2017-12-03 NOTE — Telephone Encounter (Signed)
Okay to hold Plavix 5 days prior to surgery.

## 2017-12-07 NOTE — Telephone Encounter (Signed)
   Primary Cardiologist: Peter Martinique, MD  Chart reviewed as part of pre-operative protocol coverage. Pre-op clearance already addressed by colleagues in earlier phone notes. To summarize recommendations:  -PA spoke with pt via phone 12/01/17. Pt noted he was doing well w/o cardiac symptoms. No angina. Last seen by Dr. Martinique 07/21/17 and doing well.  Given past medical history and time since last visit, based on ACC/AHA guidelines, [place] would be at acceptable risk for the planned procedure without further cardiovascular testing.   -Per Cardiologist, pt can hold Plavix 5 days prior to surgery. See comments below.  Will route this bundled recommendation to requesting provider via Epic fax function. Please call with questions.  Lyda Jester, PA-C 12/07/2017, 2:32 PM

## 2017-12-11 ENCOUNTER — Ambulatory Visit (HOSPITAL_COMMUNITY)
Admission: RE | Admit: 2017-12-11 | Discharge: 2017-12-11 | Disposition: A | Discharge status: Home / Self Care | Payer: Medicare HMO | Source: Ambulatory Visit | Attending: Neurological Surgery | Admitting: Neurological Surgery

## 2017-12-11 DIAGNOSIS — M48062 Spinal stenosis, lumbar region with neurogenic claudication: Secondary | ICD-10-CM | POA: Insufficient documentation

## 2017-12-11 DIAGNOSIS — Z981 Arthrodesis status: Secondary | ICD-10-CM | POA: Insufficient documentation

## 2017-12-29 NOTE — Pre-Procedure Instructions (Signed)
NOLTON DENIS  12/29/2017      [id number] [address], [place] [address] [place] [address] Phone: [phone] Fax: [phone]  [facility] [place], [place] [address] Phone: [phone] Fax: [phone]    Your procedure is scheduled on Tuesday August 27.  Report to [facility] Admitting at 5:30 A.M.  Call this number if you have problems the morning of surgery:  [phone]   Remember:  Do not eat or drink after midnight.    Take these medicines the morning of surgery with A SIP OF WATER:   Hydrocodone (Norco) if needed Allopurinol (if needed) Colchicine if needed  7 days prior to surgery STOP taking any Aleve, Naproxen, Ibuprofen, Motrin, Advil, Goody's, BC's, all herbal medications, fish oil, and all vitamins  FOLLOW your surgeon's instructions on stopping Aspirin and Plavix    Do not wear jewelry, make-up or nail polish.  Do not wear lotions, powders, or perfumes, or deodorant.  Do not shave 48 hours prior to surgery.  Men may shave face and neck.  Do not bring valuables to the hospital.  [facility] is not responsible for any belongings or valuables.  Contacts, dentures or bridgework may not be worn into surgery.  Leave your suitcase in the car.  After surgery it may be brought to your room.  For patients admitted to the hospital, discharge time will be determined by your treatment team.  Patients discharged the day of surgery will not be allowed to drive home.   Special instructions:    [place] [place]- Preparing For Surgery  Before surgery, you can play an important role. Because skin is not sterile, your skin needs to be as free of germs as possible. You can reduce the number of germs on your skin by washing with CHG (chlorahexidine gluconate) Soap before surgery.  CHG is an antiseptic cleaner which kills germs and  bonds with the skin to continue killing germs even after washing.    Oral Hygiene is also important to reduce your risk of infection.  Remember - BRUSH YOUR TEETH THE MORNING OF SURGERY WITH YOUR REGULAR TOOTHPASTE  Please do not use if you have an allergy to CHG or antibacterial soaps. If your skin becomes reddened/irritated stop using the CHG.  Do not shave (including legs and underarms) for at least 48 hours prior to first CHG shower. It is OK to shave your face.  Please follow these instructions carefully.   1. Shower the NIGHT BEFORE SURGERY and the MORNING OF SURGERY with CHG.   2. If you chose to wash your hair, wash your hair first as usual with your normal shampoo.  3. After you shampoo, rinse your hair and body thoroughly to remove the shampoo.  4. Use CHG as you would any other liquid soap. You can apply CHG directly to the skin and wash gently with a scrungie or a clean washcloth.   5. Apply the CHG Soap to your body ONLY FROM THE NECK DOWN.  Do not use on open wounds or open sores. Avoid contact with your eyes, ears, mouth and genitals (private parts). Wash Face and genitals (private parts)  with your normal soap.  6. Wash thoroughly, paying special attention to the area where your surgery will be performed.  7. Thoroughly rinse your body with warm water from the neck down.  8. DO NOT shower/wash  with your normal soap after using and rinsing off the CHG Soap.  9. Pat yourself dry with a CLEAN TOWEL.  10. Wear CLEAN PAJAMAS to bed the night before surgery, wear comfortable clothes the morning of surgery  11. Place CLEAN SHEETS on your bed the night of your first shower and DO NOT SLEEP WITH PETS.    Day of Surgery:  Do not apply any deodorants/lotions.  Please wear clean clothes to the hospital/surgery center.   Remember to brush your teeth WITH YOUR REGULAR TOOTHPASTE.    Please read over the following fact sheets that you were given. Coughing and Deep  Breathing, MRSA Information and Surgical Site Infection Prevention

## 2018-01-01 ENCOUNTER — Other Ambulatory Visit: Payer: Self-pay

## 2018-01-01 ENCOUNTER — Encounter (HOSPITAL_COMMUNITY): Payer: Self-pay

## 2018-01-01 ENCOUNTER — Encounter (HOSPITAL_COMMUNITY)
Admission: RE | Admit: 2018-01-01 | Discharge: 2018-01-01 | Disposition: A | Discharge status: Home / Self Care | Payer: Medicare HMO | Source: Ambulatory Visit | Attending: Neurological Surgery | Admitting: Neurological Surgery

## 2018-01-01 DIAGNOSIS — M48062 Spinal stenosis, lumbar region with neurogenic claudication: Secondary | ICD-10-CM

## 2018-01-01 DIAGNOSIS — Z01818 Encounter for other preprocedural examination: Secondary | ICD-10-CM

## 2018-01-01 LAB — BASIC METABOLIC PANEL
Anion gap: 7 (ref 5–15)
BUN: 13 mg/dL (ref 8–23)
CHLORIDE: 108 mmol/L (ref 98–111)
CO2: 25 mmol/L (ref 22–32)
Calcium: 8.9 mg/dL (ref 8.9–10.3)
Creatinine, Ser: 1.11 mg/dL (ref 0.61–1.24)
GFR calc Af Amer: >60 mL/min (ref >60)
GFR calc non Af Amer: >60 mL/min (ref >60)
Glucose, Bld: 95 mg/dL (ref 70–99)
POTASSIUM: 4.3 mmol/L (ref 3.5–5.1)
Sodium: 140 mmol/L (ref 135–145)

## 2018-01-01 LAB — CBC
HEMATOCRIT: 39.6 % (ref 39.0–52.0)
Hemoglobin: 12.8 g/dL — ABNORMAL LOW (ref 13.0–17.0)
MCH: 31.4 pg (ref 26.0–34.0)
MCHC: 32.3 g/dL (ref 30.0–36.0)
MCV: 97.1 fL (ref 78.0–100.0)
Platelets: 222 10*3/uL (ref 150–400)
RBC: 4.08 MIL/uL — ABNORMAL LOW (ref 4.22–5.81)
RDW: 14.3 % (ref 11.5–15.5)
WBC: 5.8 10*3/uL (ref 4.0–10.5)

## 2018-01-01 LAB — TYPE AND SCREEN
ABO/RH(D): A POS
Antibody Screen: NEGATIVE

## 2018-01-01 LAB — SURGICAL PCR SCREEN
MRSA, PCR: NEGATIVE
STAPHYLOCOCCUS AUREUS: NEGATIVE

## 2018-01-01 NOTE — Progress Notes (Signed)
PCP - Harlan Stains Cardiologist - Dr Martinique, pt denies any recent chest pain or cardiac issues  Chest x-ray - 06/21/17 EKG - 07/21/17 Stress Test - 08/23/16 ECHO - 08/17/16 Cardiac Cath - 08/16/16  Blood Thinner Instructions: Pt stopped ASA and plavix 7 days prior to sx Anesthesia review: hx CAD  Patient denies shortness of breath, fever, cough and chest pain at PAT appointment   Patient verbalized understanding of instructions that were given to them at the PAT appointment. Patient was also instructed that they will need to review over the PAT instructions again at home before surgery.

## 2018-01-01 NOTE — Progress Notes (Signed)
Anesthesia Chart Review:  Case:  [id number] Date/Time:  01/02/18 0715   Procedures:      Lumbar 2-3 Posterior lumbar interbody fusion with Mazor (N/A Back) - Lumbar 2-3 Posterior lumbar interbody fusion with Mazor     APPLICATION OF ROBOTIC ASSISTANCE FOR SPINAL PROCEDURE (N/A )   Anesthesia type:  General   Pre-op diagnosis:  Lumbar stenosis with neurogenic claudication   Location:  [facility] OR ROOM 20 / [facility]   Surgeon:  Kristeen Miss, MD      DISCUSSION: 77 yo male former smoker for above procedure. Pertinent hx includes CAD (mod-severe disease 08/16/16-> medical management), stroke, HTN, syncope, hyperlipidemia, renal cell carcinoma (s/p L nephrectomy). S/p R total shoulder revision 12/22/16.   Pt has cardiac clearance per Lyda Jester PA-C telephone encounter 12/07/2017 "PA spoke with pt via phone 12/01/17. Pt noted he was doing well w/o cardiac symptoms. No angina. Last seen by Dr. Martinique 07/21/17 and doing well.  Given past medical history and time since last visit, based on ACC/AHA guidelines, [place] would be at acceptable risk for the planned procedure without further cardiovascular testing. Per Cardiologist, pt can hold Plavix 5 days prior to surgery. See comments below."  Anticipate he can proceed with surgery as planned barring acute status change.  VS: BP (!) 163/74   Pulse (!) 56   Temp 36.5 C   Resp 20   Ht 5\' 7"  (1.702 m)   Wt 100.9 kg   SpO2 100%   BMI 34.83 kg/m   PROVIDERS: Harlan Stains, MD is PCP  Martinique, Peter is Cardiologist last seen 07/21/2017   LABS: Labs reviewed: Acceptable for surgery. (all labs ordered are listed, but only abnormal results are displayed)  Labs Reviewed  CBC - Abnormal; Notable for the following components:      Result Value   RBC 4.08 (*)    Hemoglobin 12.8 (*)    All other components within normal limits  SURGICAL PCR SCREEN  BASIC METABOLIC PANEL  TYPE AND SCREEN     IMAGES: CHEST  2 VIEW 06/21/2017  COMPARISON:   08/15/2016  FINDINGS: Cardiac shadow is stable. Stable nipple shadow is noted over the left base. No focal infiltrate or sizable effusion is seen. Degenerative changes of the thoracic spine are noted. Postsurgical changes in the right shoulder and cervical spine are seen.  IMPRESSION: No active cardiopulmonary disease.   EKG: 07/21/2017: Normal sinus rhythm  CV: Myocardial Perfusion Imaging 08/23/2016:  Nuclear stress EF: 51%. Mildly a synchronous contraction.  There was no ST segment deviation noted during stress. No perfusion defects at stress.  This is a low risk study. No evidence of ischemia identified.  Echo 08/17/16:  - Left ventricle: The cavity size was normal. There was mild concentric hypertrophy. Systolic function was normal. The estimated ejection fraction was in the range of 55% to 60%. Wallmotion was normal; there were no regional wall motionabnormalities. Features are consistent with a pseudonormal leftventricular filling pattern, with concomitant abnormal relaxationand increased filling pressure (grade 2 diastolic dysfunction).Doppler parameters are consistent with indeterminate ventricularfilling pressure. - Aortic valve: Transvalvular velocity was within the normal range. There was no stenosis. There was trivial regurgitation. - Mitral valve: Transvalvular velocity was within the normal range. There was no evidence for stenosis. There was trivial regurgitation. - Left atrium: The atrium was severely dilated. - Right ventricle: The cavity size was normal. Wall thickness was normal. Systolic function was normal. - Atrial septum: No defect or patent foramen ovale was  identified. - Tricuspid valve: There was trivial regurgitation. - Pulmonary arteries: Systolic pressure was within the normal range. PA peak pressure: 30 mm Hg (S).  Cardiac cath 08/16/16:  1. 3 vessel CAD with moderate calcified stenoses of the mid-LAD/second diagonal, ramus intermedius, ostial  and proximal circumflex, and severe stenosis of the second PLA branch of the RCA 2. Normal LV systolic function and normal LVEDP - Recommend: Aggressive medical therapy. Outpatient exercise stress myoview. Consider TCTS evaluation for CABG if Myoview high-risk. Otherwise continue with medical therapy. - While the LAD/diag only show modest progression compared to 2010 and 2014 cath studies, the left circumflex/intermediate stenoses have progressed.    Past Medical History:  Diagnosis Date  . Adenomatous polyp   . Anxiety   . Arthritis    "knees, ankles, shoulders" (08/15/2016)  . CAD (coronary artery disease)    s/p cath in 2014 showing significant stenosis in the diagonal side branches and in the RV marginal branch. These vessels were small and diffusely diseased. He is managed medically. 08/16/16 Cath, no disease progression  . Chronic lower back pain   . Disc disease, degenerative, cervical   . GERD (gastroesophageal reflux disease)   . Gout   . Hyperlipidemia   . Hypertension   . Osteoarthritis   . Renal cell carcinoma    left kidney removal  . Stroke [facility])    MINI STROKE 15+ YRS AGO, no residual  . Stroke ([place])    "I've had 2 or 3"; denies residual on 08/15/2016  . Syncope and collapse     Past Surgical History:  Procedure Laterality Date  . ANTERIOR CERVICAL DECOMP/DISCECTOMY FUSION  02/2004; 04/2005   [name] 5/14/2012Marland Kitchen [name] 09/20/2010  . BACK SURGERY    . CARDIAC CATHETERIZATION  12/14/2008   ef 50-55%. SHOWED SIGNIFICANT STENOSIS AND TO DIAGONAL SIDE BRANCHES AND IN THE RIGHT VENTRICULAR MARGINAL BRANCH. THESE VESSELS WERE SMALL AND DIFFUSELY DISEASED  . CARDIOVASCULAR STRESS TEST  12/10/2009   EF 61%. EKG negative. Mild peri ischemia. Managed medically.   . CARPAL TUNNEL RELEASE Right 11/2005   [name] 09/20/2010  . CARPAL TUNNEL RELEASE Left 06/2007   [name] 5/132012  . COLONOSCOPY  11/2004   [name] 09/20/2010  . COLONOSCOPY W/ BIOPSIES AND POLYPECTOMY  09/2002    [name] 09/20/2010  . INCISION AND DRAINAGE ABSCESS Right 10/18/2016   Procedure: INCISION AND DRAINAGE ABSCESS RIGHT SHOULDER;  Surgeon: [name], MD;  Location: [facility];  Service: Orthopedics;  Laterality: Right;  . INCISION AND DRAINAGE OF WOUND Right 08/2005   shoulder/notes 09/20/2010  . IR FLUORO GUIDE CV LINE LEFT  10/19/2016  . IR US GUIDE VASC ACCESS LEFT  10/19/2016  . IRRIGATION AND DEBRIDEMENT SHOULDER Right 12/22/2016   Procedure: IRRIGATION AND DEBRIDEMENT RIGHT SHOULDER;  Surgeon: [name], MD;  Location: [place];  Service: Orthopedics;  Laterality: Right;  . JOINT REPLACEMENT    . LEFT HEART CATH AND CORONARY ANGIOGRAPHY N/A 08/16/2016   Procedure: Left Heart Cath and Coronary Angiography;  Surgeon: [name], MD;  Location: [facility] CV LAB;  Service: Cardiovascular;  Laterality: N/A;  . LEFT HEART CATHETERIZATION WITH CORONARY ANGIOGRAM N/A 08/09/2012   Procedure: LEFT HEART CATHETERIZATION WITH CORONARY ANGIOGRAM;  Surgeon: Peter M Martinique, MD;  Location: [facility] CATH LAB;  Service: Cardiovascular;  Laterality: N/A;  . LUMBAR FUSION    . NEPHRECTOMY Left early 2000s  . REPLACEMENT TOTAL KNEE Right 08/2008   [name] 09/07/2010  . SHOULDER ARTHROSCOPY WITH ROTATOR  CUFF REPAIR Right 06/2005   [name] 09/20/2010  . TOTAL KNEE ARTHROPLASTY  06/08/2011   Procedure: TOTAL KNEE ARTHROPLASTY;  Surgeon: [name], MD;  Location: [place];  Service: Orthopedics;  Laterality: Left;  . TOTAL SHOULDER ARTHROPLASTY Right 04/06/2016   Procedure: RIGHT REVERSE TOTAL SHOULDER ARTHROPLASTY;  Surgeon: [name], MD;  Location: [place];  Service: Orthopedics;  Laterality: Right;  . TOTAL SHOULDER REVISION Right 12/22/2016   Procedure: RIGHT SHOULDER GLENOID AND HUMERAL COMPONENT REVISION;  Surgeon: [name], MD;  Location: [place];  Service: Orthopedics;  Laterality: Right;  . US ECHOCARDIOGRAPHY  12/15/2008   EF 60-65%  . US ECHOCARDIOGRAPHY  09/28/2004   EF  55-60%    MEDICATIONS: . acyclovir (ZOVIRAX) 400 MG tablet  . allopurinol (ZYLOPRIM) 300 MG tablet  . aspirin EC 81 MG EC tablet  . atorvastatin (LIPITOR) 80 MG tablet  . clopidogrel (PLAVIX) 75 MG tablet  . colchicine 0.6 MG tablet  . EPINEPHrine 0.3 mg/0.3 mL IJ SOAJ injection  . HYDROcodone-acetaminophen (NORCO/VICODIN) 5-325 MG tablet  . isosorbide mononitrate (IMDUR) 60 MG 24 hr tablet  . LORazepam (ATIVAN) 0.5 MG tablet  . metoprolol tartrate (LOPRESSOR) 50 MG tablet  . nitroGLYCERIN (NITROSTAT) 0.4 MG SL tablet  . olmesartan (BENICAR) 20 MG tablet  . pantoprazole (PROTONIX) 40 MG tablet   No current facility-administered medications for this encounter.     Wynonia Musty [facility] Short Stay Center/Anesthesiology Phone [phone] 01/01/2018 9:15 AM

## 2018-01-01 NOTE — Anesthesia Preprocedure Evaluation (Addendum)
Anesthesia Evaluation  Patient identified by MRN, date of birth, ID band Patient awake    Reviewed: Allergy & Precautions, H&P , NPO status , Patient's Chart, lab work & pertinent test results  Airway Mallampati: I       Dental no notable dental hx. (+) Teeth Intact   Pulmonary former smoker,    Pulmonary exam normal breath sounds clear to auscultation       Cardiovascular hypertension, + angina + CAD and + Cardiac Stents  Normal cardiovascular exam Rhythm:Regular Rate:Normal  CAD medically managed.  He stopped his plavix 8 days ago as instructed.   Neuro/Psych Anxiety CVA    GI/Hepatic GERD  ,  Endo/Other    Renal/GU      Musculoskeletal  (+) Arthritis ,   Abdominal (+) + obese,   Peds  Hematology   Anesthesia Other Findings [address] SPECT [facility] PERF [facility] STRESS 1D  Order# [id number]  Reading physician: Jerline Pain, MD Ordering physician: Cheryln Manly, NP Study date: 08/23/16 Patient Information   Name MRN Description Jake Dunn [id number] 77 y.o. male Vitals   Height Weight BMI (Calculated) 5\' 7"  (1.702 m) 102.6 kg 35.5 Study Highlights     Nuclear stress EF: 51%. Mildly a synchronous contraction.  There was no ST segment deviation noted during stress. No perfusion defects at stress.  This is a low risk study. No evidence of ischemia identified.   Jake Furbish, MD   Nuclear History and Indications   History and Indications Indication for Stress Test: Evaluation of extent and severity of coronary artery disease History: CAD; CATH- 08/16/2016=Multi-Vessel Disease; Non Obstructive CAD; Last NUC MPI on 12/09/2009-Ischemia; EF=61% Cardiac Risk Factors: CVA, Family History - CAD, History of Smoking, Hypertension, Lipids, Obesity, TIA and Hx Kidney CA w/Left Nephrectomy  Symptoms: Chest Pain, DOE, Fatigue, Palpitations and SOB Stress Findings   ECG Baseline ECG exhibits  normal sinus rhythm.Baseline ECG indicates non-specific ST-T wave changes. . Stress Findings A pharmacological stress test was performed using IV Lexiscan 0.4mg  over 10 seconds performed without concurrent submaximal exercise.  The patient reported chest discomfort and shortness of breath during the stress test. Patient also stated that he felt like he was going to pass out.(Pre Syncope). 75mg  dose of IV aminophylline given for symptom relief approximately 2 minutes after stress.  Test was stopped per protocol. Response to Stress There was no ST segment deviation noted during stress. Stress Measurements   Baseline Vitals Rest HR  65 bpm  Rest BP  152/95 mmHg  Peak Stress Vitals Peak HR  101 bpm  Peak BP  163/89 mmHg    Nuclear Stress Measurements   LV sys vol  54 mL  TID  1.1   LV dias vol  112 mL  SSS  2   SRS  0   SDS  2       Nuclear Stress Findings   Isotope administration Rest isotope was administered with an IV injection of 10.4 mCi technetium tetrofosmin. Rest SPECT images were obtained approximately 45 minutes post tracer injection. Stress isotope was administered with an IV injection of 30.1 mCi technetium tetrofosmin 20 seconds post IV Lexiscan administration. Stress SPECT images were obtained approximately 60 minutes post tracer injection. Nuclear Measurements Study was gated. Rest Perfusion There is a defect present in the basal inferior location. Stress Perfusion Stress perfusion normal. Overall Study Impression Myocardial perfusion is normal. This is a low risk study. Overall left ventricular systolic function was normal.  LV cavity size is normal. Nuclear stress EF: 51%. The left ventricular ejection fraction is mildly decreased (45-54%).   From: ACCF/SCAI/STS/AATS/AHA/ASNC/HFSA/SCCT 2012 Appropriate Use Criteria for Coronary Revascularization Focused Update Wall Scoring   Score Index: 1.000 Percent Normal: 100.0%         The left ventricular  wall motion is normal. Mild asynchronous contraction     Resulted by:   Signed Date/Time  Phone Pager Jerline Pain 08/23/2016 11:38 AM [phone]  Report approved and finalized on 08/23/2016 1138 Imaging   Imaging Information Encounter-Level Documents - 08/23/2016:   Electronic signature on 08/23/2016 7:52 AM: chmg/nl/dwm - Signed  Electronic signature on 08/23/2016 7:51 AM: chmg/nl/dwm - Signed    Order-Level Documents - 08/23/2016:   Scan on 08/23/2016 10:53 AM by Delight Hoh A: mpi studyScan on 08/23/2016 10:53 AM by Delight Hoh A: mpi study  Scan on 08/23/2016 9:32 AM by Default, Provider, MDScan on 08/23/2016 9:32 AM by Default, Provider, [place]:   There are no hospital account-level documents.  Exam Information   Status Exam Begun  Exam Ended  Final (99) 08/23/2016 8:15 AM 08/23/2016 10:35 AM External Result Report   External Result Report Encounter Report   Patient Encounter Report    Reproductive/Obstetrics                            Anesthesia Physical  Anesthesia Plan  ASA: III  Anesthesia Plan: General   Post-op Pain Management:  Regional for Post-op pain   Induction: Intravenous  PONV Risk Score and Plan:   Airway Management Planned: Oral ETT  Additional Equipment: Arterial line  Intra-op Plan:   Post-operative Plan: Extubation in OR  Informed Consent: I have reviewed the patients History and Physical, chart, labs and discussed the procedure including the risks, benefits and alternatives for the proposed anesthesia with the patient or authorized representative who has indicated his/her understanding and acceptance.   Dental advisory given  Plan Discussed with: CRNA and Surgeon  Anesthesia Plan Comments:        Anesthesia Quick Evaluation

## 2018-01-02 ENCOUNTER — Inpatient Hospital Stay (HOSPITAL_COMMUNITY)
Admission: RE | Admit: 2018-01-02 | Discharge: 2018-01-04 | DRG: 454 | Disposition: A | Discharge status: Home / Self Care | Payer: Medicare HMO | Attending: Neurological Surgery | Admitting: Neurological Surgery

## 2018-01-02 ENCOUNTER — Encounter (HOSPITAL_COMMUNITY): Payer: Self-pay | Admitting: *Deleted

## 2018-01-02 ENCOUNTER — Inpatient Hospital Stay (HOSPITAL_COMMUNITY): Payer: Medicare HMO | Admitting: Physician Assistant

## 2018-01-02 ENCOUNTER — Inpatient Hospital Stay (HOSPITAL_COMMUNITY): Payer: Medicare HMO | Admitting: Anesthesiology

## 2018-01-02 ENCOUNTER — Inpatient Hospital Stay (HOSPITAL_COMMUNITY): Payer: Medicare HMO

## 2018-01-02 ENCOUNTER — Inpatient Hospital Stay (HOSPITAL_COMMUNITY)
Admission: RE | Disposition: A | Discharge status: Home / Self Care | Payer: Self-pay | Source: Home / Self Care | Attending: Neurological Surgery

## 2018-01-02 DIAGNOSIS — M48062 Spinal stenosis, lumbar region with neurogenic claudication: Principal | ICD-10-CM | POA: Diagnosis present

## 2018-01-02 DIAGNOSIS — K219 Gastro-esophageal reflux disease without esophagitis: Secondary | ICD-10-CM | POA: Diagnosis not present

## 2018-01-02 DIAGNOSIS — Z85528 Personal history of other malignant neoplasm of kidney: Secondary | ICD-10-CM | POA: Diagnosis not present

## 2018-01-02 DIAGNOSIS — Z981 Arthrodesis status: Secondary | ICD-10-CM | POA: Diagnosis not present

## 2018-01-02 DIAGNOSIS — M48061 Spinal stenosis, lumbar region without neurogenic claudication: Secondary | ICD-10-CM | POA: Diagnosis not present

## 2018-01-02 DIAGNOSIS — Z87891 Personal history of nicotine dependence: Secondary | ICD-10-CM | POA: Diagnosis not present

## 2018-01-02 DIAGNOSIS — I2510 Atherosclerotic heart disease of native coronary artery without angina pectoris: Secondary | ICD-10-CM | POA: Diagnosis not present

## 2018-01-02 DIAGNOSIS — I10 Essential (primary) hypertension: Secondary | ICD-10-CM | POA: Diagnosis not present

## 2018-01-02 DIAGNOSIS — E785 Hyperlipidemia, unspecified: Secondary | ICD-10-CM | POA: Diagnosis not present

## 2018-01-02 DIAGNOSIS — D62 Acute posthemorrhagic anemia: Secondary | ICD-10-CM | POA: Diagnosis not present

## 2018-01-02 DIAGNOSIS — M4316 Spondylolisthesis, lumbar region: Secondary | ICD-10-CM | POA: Diagnosis not present

## 2018-01-02 DIAGNOSIS — Z8673 Personal history of transient ischemic attack (TIA), and cerebral infarction without residual deficits: Secondary | ICD-10-CM

## 2018-01-02 DIAGNOSIS — Z419 Encounter for procedure for purposes other than remedying health state, unspecified: Secondary | ICD-10-CM

## 2018-01-02 DIAGNOSIS — M47816 Spondylosis without myelopathy or radiculopathy, lumbar region: Secondary | ICD-10-CM | POA: Diagnosis not present

## 2018-01-02 DIAGNOSIS — M5416 Radiculopathy, lumbar region: Secondary | ICD-10-CM

## 2018-01-02 DIAGNOSIS — Z96652 Presence of left artificial knee joint: Secondary | ICD-10-CM | POA: Diagnosis present

## 2018-01-02 DIAGNOSIS — M4326 Fusion of spine, lumbar region: Secondary | ICD-10-CM | POA: Diagnosis not present

## 2018-01-02 DIAGNOSIS — M4726 Other spondylosis with radiculopathy, lumbar region: Secondary | ICD-10-CM | POA: Diagnosis present

## 2018-01-02 DIAGNOSIS — M79604 Pain in right leg: Secondary | ICD-10-CM | POA: Diagnosis present

## 2018-01-02 HISTORY — PX: APPLICATION OF ROBOTIC ASSISTANCE FOR SPINAL PROCEDURE: SHX6753

## 2018-01-02 SURGERY — POSTERIOR LUMBAR FUSION 1 LEVEL
Anesthesia: General | Site: Back

## 2018-01-02 MED ORDER — HEMOSTATIC AGENTS (NO CHARGE) OPTIME
TOPICAL | Status: DC | PRN
  Administered 2018-01-02: 1 via TOPICAL

## 2018-01-02 MED ORDER — ATORVASTATIN CALCIUM 80 MG PO TABS
80.0000 mg | ORAL_TABLET | Freq: Every evening | ORAL | Status: DC
  Administered 2018-01-02 – 2018-01-03 (×2): 80 mg via ORAL
  Filled 2018-01-02 (×2): qty 1

## 2018-01-02 MED ORDER — CEFAZOLIN SODIUM-DEXTROSE 2-4 GM/100ML-% IV SOLN
2.0000 g | INTRAVENOUS | Status: AC
  Administered 2018-01-02: 2 g via INTRAVENOUS

## 2018-01-02 MED ORDER — ISOSORBIDE MONONITRATE ER 60 MG PO TB24
60.0000 mg | ORAL_TABLET | Freq: Every evening | ORAL | Status: DC
  Administered 2018-01-02 – 2018-01-03 (×2): 60 mg via ORAL
  Filled 2018-01-02 (×2): qty 1

## 2018-01-02 MED ORDER — FENTANYL CITRATE (PF) 250 MCG/5ML IJ SOLN
INTRAMUSCULAR | Status: AC
  Filled 2018-01-02: qty 5

## 2018-01-02 MED ORDER — THROMBIN (RECOMBINANT) 20000 UNITS EX SOLR
CUTANEOUS | Status: AC
  Filled 2018-01-02: qty 20000

## 2018-01-02 MED ORDER — LACTATED RINGERS IV SOLN
INTRAVENOUS | Status: DC | PRN
  Administered 2018-01-02: 07:00:00 via INTRAVENOUS

## 2018-01-02 MED ORDER — PROMETHAZINE HCL 25 MG/ML IJ SOLN: 6.2500 mg | INTRAMUSCULAR | Status: DC | PRN

## 2018-01-02 MED ORDER — MORPHINE SULFATE (PF) 2 MG/ML IV SOLN: 2.0000 mg | INTRAVENOUS | Status: DC | PRN

## 2018-01-02 MED ORDER — NITROGLYCERIN 0.4 MG SL SUBL: 0.4000 mg | SUBLINGUAL_TABLET | SUBLINGUAL | Status: DC | PRN

## 2018-01-02 MED ORDER — SODIUM CHLORIDE 0.9% FLUSH: 3.0000 mL | INTRAVENOUS | Status: DC | PRN

## 2018-01-02 MED ORDER — ONDANSETRON HCL 4 MG/2ML IJ SOLN
INTRAMUSCULAR | Status: DC | PRN
  Administered 2018-01-02: 4 mg via INTRAVENOUS

## 2018-01-02 MED ORDER — ROCURONIUM BROMIDE 50 MG/5ML IV SOSY
PREFILLED_SYRINGE | INTRAVENOUS | Status: AC
  Filled 2018-01-02: qty 5

## 2018-01-02 MED ORDER — KETOROLAC TROMETHAMINE 15 MG/ML IJ SOLN
15.0000 mg | Freq: Once | INTRAMUSCULAR | Status: AC
  Administered 2018-01-02: 15 mg via INTRAVENOUS

## 2018-01-02 MED ORDER — ONDANSETRON HCL 4 MG/2ML IJ SOLN
4.0000 mg | Freq: Four times a day (QID) | INTRAMUSCULAR | Status: DC | PRN
  Administered 2018-01-02: 4 mg via INTRAVENOUS
  Filled 2018-01-02: qty 2

## 2018-01-02 MED ORDER — FLEET ENEMA 7-19 GM/118ML RE ENEM: 1.0000 | ENEMA | Freq: Once | RECTAL | Status: DC | PRN

## 2018-01-02 MED ORDER — ARTIFICIAL TEARS OPHTHALMIC OINT
TOPICAL_OINTMENT | OPHTHALMIC | Status: AC
  Filled 2018-01-02: qty 3.5

## 2018-01-02 MED ORDER — SUGAMMADEX SODIUM 200 MG/2ML IV SOLN
INTRAVENOUS | Status: DC | PRN
  Administered 2018-01-02: 200 mg via INTRAVENOUS

## 2018-01-02 MED ORDER — ROCURONIUM BROMIDE 50 MG/5ML IV SOSY
PREFILLED_SYRINGE | INTRAVENOUS | Status: DC | PRN
  Administered 2018-01-02 (×3): 20 mg via INTRAVENOUS
  Administered 2018-01-02: 30 mg via INTRAVENOUS
  Administered 2018-01-02: 40 mg via INTRAVENOUS
  Administered 2018-01-02 (×2): 10 mg via INTRAVENOUS

## 2018-01-02 MED ORDER — LIDOCAINE-EPINEPHRINE 1 %-1:100000 IJ SOLN
INTRAMUSCULAR | Status: AC
  Filled 2018-01-02: qty 1

## 2018-01-02 MED ORDER — EPHEDRINE SULFATE 50 MG/ML IJ SOLN
INTRAMUSCULAR | Status: DC | PRN
  Administered 2018-01-02: 10 mg via INTRAVENOUS
  Administered 2018-01-02: 5 mg via INTRAVENOUS

## 2018-01-02 MED ORDER — LORAZEPAM 0.5 MG PO TABS: 0.5000 mg | ORAL_TABLET | Freq: Two times a day (BID) | ORAL | Status: DC | PRN

## 2018-01-02 MED ORDER — BISACODYL 10 MG RE SUPP: 10.0000 mg | Freq: Every day | RECTAL | Status: DC | PRN

## 2018-01-02 MED ORDER — OXYCODONE-ACETAMINOPHEN 5-325 MG PO TABS
1.0000 | ORAL_TABLET | ORAL | Status: DC | PRN
  Administered 2018-01-02 (×2): 1 via ORAL
  Administered 2018-01-02: 2 via ORAL
  Administered 2018-01-03 (×2): 1 via ORAL
  Administered 2018-01-03: 2 via ORAL
  Administered 2018-01-03: 1 via ORAL
  Administered 2018-01-03 – 2018-01-04 (×4): 2 via ORAL
  Administered 2018-01-04: 1 via ORAL
  Filled 2018-01-02: qty 1
  Filled 2018-01-02: qty 2
  Filled 2018-01-02 (×2): qty 1
  Filled 2018-01-02: qty 2
  Filled 2018-01-02: qty 1
  Filled 2018-01-02 (×3): qty 2
  Filled 2018-01-02: qty 1
  Filled 2018-01-02: qty 2

## 2018-01-02 MED ORDER — METOPROLOL TARTRATE 50 MG PO TABS
50.0000 mg | ORAL_TABLET | Freq: Every evening | ORAL | Status: DC
  Administered 2018-01-02 – 2018-01-03 (×2): 50 mg via ORAL
  Filled 2018-01-02 (×2): qty 1

## 2018-01-02 MED ORDER — MEPERIDINE HCL 50 MG/ML IJ SOLN: 6.2500 mg | INTRAMUSCULAR | Status: DC | PRN

## 2018-01-02 MED ORDER — DOCUSATE SODIUM 100 MG PO CAPS
100.0000 mg | ORAL_CAPSULE | Freq: Two times a day (BID) | ORAL | Status: DC
  Administered 2018-01-02 – 2018-01-04 (×5): 100 mg via ORAL
  Filled 2018-01-02 (×5): qty 1

## 2018-01-02 MED ORDER — LIDOCAINE 2% (20 MG/ML) 5 ML SYRINGE
INTRAMUSCULAR | Status: DC | PRN
  Administered 2018-01-02: 100 mg via INTRAVENOUS

## 2018-01-02 MED ORDER — IRBESARTAN 150 MG PO TABS
150.0000 mg | ORAL_TABLET | Freq: Every day | ORAL | Status: DC
  Administered 2018-01-02 – 2018-01-04 (×3): 150 mg via ORAL
  Filled 2018-01-02 (×3): qty 1

## 2018-01-02 MED ORDER — COLCHICINE 0.6 MG PO TABS: 0.6000 mg | ORAL_TABLET | Freq: Every day | ORAL | Status: DC | PRN

## 2018-01-02 MED ORDER — BUPIVACAINE HCL (PF) 0.5 % IJ SOLN
INTRAMUSCULAR | Status: DC | PRN
  Administered 2018-01-02: 20 mL
  Administered 2018-01-02: 10 mL

## 2018-01-02 MED ORDER — ONDANSETRON HCL 4 MG/2ML IJ SOLN
INTRAMUSCULAR | Status: AC
  Filled 2018-01-02: qty 2

## 2018-01-02 MED ORDER — HYDROMORPHONE HCL 1 MG/ML IJ SOLN
0.2500 mg | INTRAMUSCULAR | Status: DC | PRN
  Administered 2018-01-02 (×2): 0.5 mg via INTRAVENOUS

## 2018-01-02 MED ORDER — BUPIVACAINE HCL (PF) 0.5 % IJ SOLN
INTRAMUSCULAR | Status: AC
  Filled 2018-01-02: qty 30

## 2018-01-02 MED ORDER — EPHEDRINE 5 MG/ML INJ
INTRAVENOUS | Status: AC
  Filled 2018-01-02: qty 10

## 2018-01-02 MED ORDER — ONDANSETRON HCL 4 MG PO TABS: 4.0000 mg | ORAL_TABLET | Freq: Four times a day (QID) | ORAL | Status: DC | PRN

## 2018-01-02 MED ORDER — SENNA 8.6 MG PO TABS
1.0000 | ORAL_TABLET | Freq: Two times a day (BID) | ORAL | Status: DC
  Administered 2018-01-02 – 2018-01-04 (×5): 8.6 mg via ORAL
  Filled 2018-01-02 (×5): qty 1

## 2018-01-02 MED ORDER — LIDOCAINE 2% (20 MG/ML) 5 ML SYRINGE
INTRAMUSCULAR | Status: AC
  Filled 2018-01-02: qty 5

## 2018-01-02 MED ORDER — SODIUM CHLORIDE 0.9 % IV SOLN
INTRAVENOUS | Status: DC | PRN
  Administered 2018-01-02: 09:00:00

## 2018-01-02 MED ORDER — FENTANYL CITRATE (PF) 100 MCG/2ML IJ SOLN
INTRAMUSCULAR | Status: DC | PRN
  Administered 2018-01-02 (×3): 50 ug via INTRAVENOUS
  Administered 2018-01-02: 100 ug via INTRAVENOUS
  Administered 2018-01-02 (×3): 50 ug via INTRAVENOUS

## 2018-01-02 MED ORDER — ACETAMINOPHEN 650 MG RE SUPP: 650.0000 mg | RECTAL | Status: DC | PRN

## 2018-01-02 MED ORDER — HYDROMORPHONE HCL 1 MG/ML IJ SOLN
INTRAMUSCULAR | Status: AC
  Filled 2018-01-02: qty 1

## 2018-01-02 MED ORDER — DEXAMETHASONE SODIUM PHOSPHATE 10 MG/ML IJ SOLN
INTRAMUSCULAR | Status: AC
  Filled 2018-01-02: qty 1

## 2018-01-02 MED ORDER — METHOCARBAMOL 500 MG PO TABS
ORAL_TABLET | ORAL | Status: AC
  Filled 2018-01-02: qty 1

## 2018-01-02 MED ORDER — CHLORHEXIDINE GLUCONATE CLOTH 2 % EX PADS: 6.0000 | MEDICATED_PAD | Freq: Once | CUTANEOUS | Status: DC

## 2018-01-02 MED ORDER — PHENYLEPHRINE 40 MCG/ML (10ML) SYRINGE FOR IV PUSH (FOR BLOOD PRESSURE SUPPORT)
PREFILLED_SYRINGE | INTRAVENOUS | Status: AC
  Filled 2018-01-02: qty 10

## 2018-01-02 MED ORDER — MIDAZOLAM HCL 5 MG/5ML IJ SOLN
INTRAMUSCULAR | Status: DC | PRN
  Administered 2018-01-02 (×2): 1 mg via INTRAVENOUS

## 2018-01-02 MED ORDER — LACTATED RINGERS IV SOLN
INTRAVENOUS | Status: DC
  Administered 2018-01-02 – 2018-01-03 (×3): via INTRAVENOUS

## 2018-01-02 MED ORDER — ALLOPURINOL 300 MG PO TABS: 300.0000 mg | ORAL_TABLET | Freq: Every day | ORAL | Status: DC | PRN

## 2018-01-02 MED ORDER — MENTHOL 3 MG MT LOZG: 1.0000 | LOZENGE | OROMUCOSAL | Status: DC | PRN

## 2018-01-02 MED ORDER — LIDOCAINE-EPINEPHRINE 1 %-1:100000 IJ SOLN
INTRAMUSCULAR | Status: DC | PRN
  Administered 2018-01-02: 10 mL

## 2018-01-02 MED ORDER — PHENOL 1.4 % MT LIQD: 1.0000 | OROMUCOSAL | Status: DC | PRN

## 2018-01-02 MED ORDER — KETOROLAC TROMETHAMINE 15 MG/ML IJ SOLN
INTRAMUSCULAR | Status: AC
  Filled 2018-01-02: qty 1

## 2018-01-02 MED ORDER — PROPOFOL 10 MG/ML IV BOLUS
INTRAVENOUS | Status: DC | PRN
  Administered 2018-01-02: 150 mg via INTRAVENOUS

## 2018-01-02 MED ORDER — ACETAMINOPHEN 10 MG/ML IV SOLN
INTRAVENOUS | Status: AC
  Filled 2018-01-02: qty 100

## 2018-01-02 MED ORDER — OXYCODONE-ACETAMINOPHEN 5-325 MG PO TABS
ORAL_TABLET | ORAL | Status: AC
  Filled 2018-01-02: qty 1

## 2018-01-02 MED ORDER — SODIUM CHLORIDE 0.9% FLUSH
3.0000 mL | Freq: Two times a day (BID) | INTRAVENOUS | Status: DC
  Administered 2018-01-03 – 2018-01-04 (×3): 3 mL via INTRAVENOUS

## 2018-01-02 MED ORDER — CEFAZOLIN SODIUM-DEXTROSE 2-4 GM/100ML-% IV SOLN
INTRAVENOUS | Status: AC
  Filled 2018-01-02: qty 100

## 2018-01-02 MED ORDER — POLYETHYLENE GLYCOL 3350 17 G PO PACK: 17.0000 g | PACK | Freq: Every day | ORAL | Status: DC | PRN

## 2018-01-02 MED ORDER — DEXAMETHASONE SODIUM PHOSPHATE 10 MG/ML IJ SOLN
INTRAMUSCULAR | Status: DC | PRN
  Administered 2018-01-02: 10 mg via INTRAVENOUS

## 2018-01-02 MED ORDER — PROPOFOL 10 MG/ML IV BOLUS
INTRAVENOUS | Status: AC
  Filled 2018-01-02: qty 20

## 2018-01-02 MED ORDER — DEXMEDETOMIDINE HCL IN NACL 400 MCG/100ML IV SOLN
INTRAVENOUS | Status: DC | PRN
  Administered 2018-01-02: .4 ug/kg/h via INTRAVENOUS

## 2018-01-02 MED ORDER — WHITE PETROLATUM EX OINT
TOPICAL_OINTMENT | CUTANEOUS | Status: AC
  Administered 2018-01-02: 16:00:00
  Filled 2018-01-02: qty 28.35

## 2018-01-02 MED ORDER — THROMBIN 20000 UNITS EX SOLR
CUTANEOUS | Status: DC | PRN
  Administered 2018-01-02: 09:00:00 via TOPICAL

## 2018-01-02 MED ORDER — METHOCARBAMOL 1000 MG/10ML IJ SOLN
500.0000 mg | Freq: Four times a day (QID) | INTRAVENOUS | Status: DC | PRN
  Filled 2018-01-02: qty 5

## 2018-01-02 MED ORDER — ACETAMINOPHEN 10 MG/ML IV SOLN
INTRAVENOUS | Status: DC | PRN
  Administered 2018-01-02: 1000 mg via INTRAVENOUS

## 2018-01-02 MED ORDER — METHOCARBAMOL 500 MG PO TABS
500.0000 mg | ORAL_TABLET | Freq: Four times a day (QID) | ORAL | Status: DC | PRN
  Administered 2018-01-02 – 2018-01-04 (×6): 500 mg via ORAL
  Filled 2018-01-02 (×5): qty 1

## 2018-01-02 MED ORDER — ARTIFICIAL TEARS OPHTHALMIC OINT
TOPICAL_OINTMENT | OPHTHALMIC | Status: DC | PRN
  Administered 2018-01-02: 1 via OPHTHALMIC

## 2018-01-02 MED ORDER — ACETAMINOPHEN 325 MG PO TABS: 650.0000 mg | ORAL_TABLET | ORAL | Status: DC | PRN

## 2018-01-02 MED ORDER — PANTOPRAZOLE SODIUM 40 MG PO TBEC
40.0000 mg | DELAYED_RELEASE_TABLET | Freq: Every evening | ORAL | Status: DC
  Administered 2018-01-02 – 2018-01-03 (×2): 40 mg via ORAL
  Filled 2018-01-02 (×2): qty 1

## 2018-01-02 MED ORDER — 0.9 % SODIUM CHLORIDE (POUR BTL) OPTIME
TOPICAL | Status: DC | PRN
  Administered 2018-01-02: 1000 mL

## 2018-01-02 MED ORDER — SODIUM CHLORIDE 0.9 % IV SOLN: 250.0000 mL | INTRAVENOUS | Status: DC

## 2018-01-02 MED ORDER — SODIUM CHLORIDE 0.9 % IV SOLN
INTRAVENOUS | Status: DC | PRN
  Administered 2018-01-02: 30 ug/min via INTRAVENOUS

## 2018-01-02 MED ORDER — ALUM & MAG HYDROXIDE-SIMETH 200-200-20 MG/5ML PO SUSP: 30.0000 mL | Freq: Four times a day (QID) | ORAL | Status: DC | PRN

## 2018-01-02 MED ORDER — MIDAZOLAM HCL 2 MG/2ML IJ SOLN
INTRAMUSCULAR | Status: AC
  Filled 2018-01-02: qty 2

## 2018-01-02 SURGICAL SUPPLY — 80 items
ADH SKN CLS APL DERMABOND .7 (GAUZE/BANDAGES/DRESSINGS) ×2
APL SRG 60D 8 XTD TIP BNDBL (TIP)
BAG DECANTER FOR FLEXI CONT (MISCELLANEOUS) ×3 IMPLANT
BASKET BONE COLLECTION (BASKET) ×3 IMPLANT
BIT DRILL LONG 3.0X30 (BIT) ×1 IMPLANT
BIT DRILL LONG 3X80 (BIT) IMPLANT
BIT DRILL LONG 4X80 (BIT) IMPLANT
BIT DRILL SHORT 3.0X30 (BIT) IMPLANT
BIT DRILL SHORT 3X80 (BIT) IMPLANT
BLADE 10 SAFETY STRL DISP (BLADE) ×2 IMPLANT
BLADE CLIPPER SURG (BLADE) IMPLANT
BLADE SURG 11 STRL SS (BLADE) ×1 IMPLANT
BONE CANC CHIPS 20CC PCAN1/4 (Bone Implant) ×3 IMPLANT
BUR MATCHSTICK NEURO 3.0 LAGG (BURR) ×3 IMPLANT
CAGE COROENT MP 8X9X23M-8 SPIN (Cage) ×2 IMPLANT
CANISTER SUCT 3000ML PPV (MISCELLANEOUS) ×3 IMPLANT
CHIPS CANC BONE 20CC PCAN1/4 (Bone Implant) ×2 IMPLANT
CONT SPEC 4OZ CLIKSEAL STRL BL (MISCELLANEOUS) ×3 IMPLANT
COVER BACK TABLE 60X90IN (DRAPES) ×3 IMPLANT
DECANTER SPIKE VIAL GLASS SM (MISCELLANEOUS) ×3 IMPLANT
DERMABOND ADVANCED (GAUZE/BANDAGES/DRESSINGS) ×1
DERMABOND ADVANCED .7 DNX12 (GAUZE/BANDAGES/DRESSINGS) ×2 IMPLANT
DEVICE DISSECT PLASMABLAD 3.0S (MISCELLANEOUS) ×2 IMPLANT
DRAPE C-ARM 42X72 X-RAY (DRAPES) ×4 IMPLANT
DRAPE C-ARMOR (DRAPES) ×1 IMPLANT
DRAPE HALF SHEET 40X57 (DRAPES) IMPLANT
DRAPE LAPAROTOMY 100X72X124 (DRAPES) ×3 IMPLANT
DRAPE SHEET LG 3/4 BI-LAMINATE (DRAPES) ×4 IMPLANT
DURAPREP 26ML APPLICATOR (WOUND CARE) ×3 IMPLANT
DURASEAL APPLICATOR TIP (TIP) IMPLANT
DURASEAL SPINE SEALANT 3ML (MISCELLANEOUS) IMPLANT
ELECT BLADE 4.0 EZ CLEAN MEGAD (MISCELLANEOUS) ×3
ELECT REM PT RETURN 9FT ADLT (ELECTROSURGICAL) ×3
ELECTRODE BLDE 4.0 EZ CLN MEGD (MISCELLANEOUS) IMPLANT
ELECTRODE REM PT RTRN 9FT ADLT (ELECTROSURGICAL) ×2 IMPLANT
GAUZE 4X4 16PLY RFD (DISPOSABLE) IMPLANT
GAUZE SPONGE 4X4 12PLY STRL (GAUZE/BANDAGES/DRESSINGS) ×3 IMPLANT
GLOVE BIOGEL PI IND STRL 8.5 (GLOVE) ×5 IMPLANT
GLOVE BIOGEL PI INDICATOR 8.5 (GLOVE) ×3
GLOVE ECLIPSE 8.5 STRL (GLOVE) ×2 IMPLANT
GLOVE SURG SS PI 8.5 STRL IVOR (GLOVE) ×3
GLOVE SURG SS PI 8.5 STRL STRW (GLOVE) IMPLANT
GOWN STRL REUS W/ TWL LRG LVL3 (GOWN DISPOSABLE) IMPLANT
GOWN STRL REUS W/ TWL XL LVL3 (GOWN DISPOSABLE) ×1 IMPLANT
GOWN STRL REUS W/TWL 2XL LVL3 (GOWN DISPOSABLE) ×6 IMPLANT
GOWN STRL REUS W/TWL LRG LVL3 (GOWN DISPOSABLE)
GOWN STRL REUS W/TWL XL LVL3 (GOWN DISPOSABLE) ×3
GRAFT BNE CANC CHIPS 1-8 20CC (Bone Implant) IMPLANT
GUIDEWIRE NITINOL BEVEL TIP (WIRE) ×4 IMPLANT
HEMOSTAT POWDER KIT SURGIFOAM (HEMOSTASIS) IMPLANT
KIT BASIN OR (CUSTOM PROCEDURE TRAY) ×3 IMPLANT
KIT INFUSE X SMALL 1.4CC (Orthopedic Implant) ×1 IMPLANT
KIT SPINE MAZOR X ROBO DISP (MISCELLANEOUS) ×3 IMPLANT
KIT TURNOVER KIT B (KITS) ×3 IMPLANT
MILL MEDIUM DISP (BLADE) ×3 IMPLANT
NEEDLE HYPO 22GX1.5 SAFETY (NEEDLE) ×3 IMPLANT
NS IRRIG 1000ML POUR BTL (IV SOLUTION) ×3 IMPLANT
PACK LAMINECTOMY NEURO (CUSTOM PROCEDURE TRAY) ×3 IMPLANT
PAD ARMBOARD 7.5X6 YLW CONV (MISCELLANEOUS) ×9 IMPLANT
PATTIES SURGICAL .5 X1 (DISPOSABLE) ×3 IMPLANT
PIN HEAD 2.5X60MM (PIN) IMPLANT
PLASMABLADE 3.0S (MISCELLANEOUS) ×3
ROD RELINE MAS TI LORD 5.5X40 (Rod) ×2 IMPLANT
SCREW LOCK RELINE 5.5 TULIP (Screw) ×4 IMPLANT
SCREW MAS RELINE 6.5X50 POLY (Screw) ×4 IMPLANT
SCREW PA RELINE MAS 5.5X50 C2 (Screw) ×2 IMPLANT
SCREW SCHANZ SA 4.0MM (MISCELLANEOUS) ×2 IMPLANT
SPONGE LAP 4X18 RFD (DISPOSABLE) IMPLANT
SPONGE SURGIFOAM ABS GEL 100 (HEMOSTASIS) ×3 IMPLANT
SUT PROLENE 6 0 BV (SUTURE) IMPLANT
SUT VIC AB 1 CT1 18XBRD ANBCTR (SUTURE) ×2 IMPLANT
SUT VIC AB 1 CT1 8-18 (SUTURE) ×3
SUT VIC AB 2-0 CP2 18 (SUTURE) ×4 IMPLANT
SUT VIC AB 3-0 SH 8-18 (SUTURE) ×5 IMPLANT
SYR 3ML LL SCALE MARK (SYRINGE) ×12 IMPLANT
TOWEL GREEN STERILE (TOWEL DISPOSABLE) ×3 IMPLANT
TOWEL GREEN STERILE FF (TOWEL DISPOSABLE) ×3 IMPLANT
TRAY FOLEY MTR SLVR 16FR STAT (SET/KITS/TRAYS/PACK) ×3 IMPLANT
TUBE MAZOR SA REDUCTION (TUBING) ×3 IMPLANT
WATER STERILE IRR 1000ML POUR (IV SOLUTION) ×3 IMPLANT

## 2018-01-02 NOTE — Anesthesia Procedure Notes (Signed)
Procedure Name: Intubation Date/Time: 01/02/2018 7:45 AM Performed by: Clearnce Sorrel, CRNA Pre-anesthesia Checklist: Patient identified, Emergency Drugs available, Suction available and Patient being monitored Patient Re-evaluated:Patient Re-evaluated prior to induction Oxygen Delivery Method: Circle System Utilized Preoxygenation: Pre-oxygenation with 100% oxygen Induction Type: IV induction Ventilation: Mask ventilation without difficulty and Oral airway inserted - appropriate to patient size Laryngoscope Size: Mac and 4 Grade View: Grade I Tube type: Oral Tube size: 7.5 mm Number of attempts: 1 Airway Equipment and Method: Stylet and Oral airway Placement Confirmation: ETT inserted through vocal cords under direct vision,  positive ETCO2 and breath sounds checked- equal and bilateral Secured at: 23 cm Tube secured with: Tape Dental Injury: Teeth and Oropharynx as per pre-operative assessment

## 2018-01-02 NOTE — Op Note (Signed)
Date of surgery: 01/02/2018 Preoperative diagnosis: Spondylosis and stenosis L2-L3 status post decompression and fusion L3 to sacrum Postoperative diagnosis: Same Procedure: Decompression L2-L3 with posterior lumbar interbody arthrodesis using peek spacers local autograft and allograft and infuse Posterior fixation L2-L3 with pedicle screws posterior lateral arthrodesis with local autograft allograft and infuse Surgeon: Kristeen Miss First assistant: Deri Fuelling, MD Anesthesia: General endotracheal Indications: Jake Dunn is a 77 year old individual has had previous decompressions and fusions in the lumbar spine initially with a spondylolisthesis at L5-S1 some 27 years ago he has developed adjacent level disease and has undergone decompression fusion up to L3 but now has retrolisthesis of L2 on L3 with severe stenosis at that level.  He is been advised regarding surgical decompression and stabilization.  Procedure: The patient was brought to the operating room supine on the stretcher after the smooth induction of general tracheal anesthesia he was turned prone onto a Jackson table.  The back was prepped with alcohol DuraPrep and draped in a sterile fashion.  The robotic arm was then connected and a Steinmann pin was placed in the posterior superior iliac crest on the left side.  The robotic arm was attached to this pin and then after draping everything sterilely registration radiographs were obtained in the AP and oblique positions.  Pedicle entry sites had been preprogrammed and chosen at L2 and L3 and then through separate paramedian incisions pedicle entry sites were chosen at L2 and L3.  After guidewires were placed 6.5 x 50 mm screws were placed in L3 and 5.5 x 50 mm screws were placed in L2.  40 mm precontoured rods were then used to connect the screws together.  The robotic arm was removed.  Next a midline incision was created and carried down to the lumbodorsal fascia.  The interspace at L2-L3 was  uncovered in a subperiosteal fashion.  Large overgrown hypertrophied facets at L2-3 were removed and the common dural tube was then decompressed.  Care was taken to preserve the passage of the L2 nerve root superiorly and the L3 nerve root inferiorly and these were decompressed using a 2 and a 3 mm Kerrison punch.  Ultimately the disc space was isolated.  Then the disc space was incised and entered a 7 mm disc shaver was used to remove substantial quantities of severely degenerated and desiccated disc material.  Large posterior bony osteophytes were also removed to allow good decompression of the canal centrally.  The disc space was decorticated on either side and ultimately was felt that an 8 mm tall 8 degree lordotic 25 mm long spacer would fit best into the interspace.  Bone graft from the bone that was harvested mixed with 20 cc of cancellus bone chips and  extra small infuse was mixed into the bone graft and was placed into the interspace.  9 cc of bone graft were placed totally and the 2 spacers were placed.  Lateral gutters were then decorticated and approximately 3 cc into each lateral gutter was placed between the transverse processes of L2 and L3.  Once the graft was placed retractors were removed final radiographs were obtained in AP and lateral projection.  A good neutral construct was established.  With this the lumbodorsal fascia was closed with #1 Vicryl interrupted fashion 2-0 Vicryl was used in the subcutaneous tissues on all incisions and 3-0 Vicryl was used to close subcuticular skin.  Patient tolerated procedure well was returned to recovery room blood loss was estimated about 400 cc.

## 2018-01-02 NOTE — H&P (Signed)
Jake Dunn is an 77 y.o. male.   Chief Complaint: Back and bilateral leg pain HPI: Jake Dunn is a 77 year old individual whom known and treated for the past 30 years.  He presented initially with a spondylolisthesis at L5-S1.  He underwent an in situ fusion.  He subsequently developed adjacent level disease at L4-5 and L3-4.  He came back and had a ray cage fusion at those levels.  He did reasonably well for period of time but then it was evident that he developed a pseudoarthrosis at both L4-5 and L5-S1.  He underwent pedicle screw fixation with posterior lateral arthrodesis.  Ultimately he developed a solid arthrodesis however he desired to have the hardware removed and his last operation was just that removal of hardware.  He continued to do reasonably well until last year when he developed increasing back pain now with leg pain and weakness proximally in the iliopsoas he was found to have a severe stenosis with retrolisthesis at L2-L3.  He is now admitted to undergo surgical decompression and stabilization at that level with robotic assistance and screw placement.  Past Medical History:  Diagnosis Date  . Adenomatous polyp   . Anxiety   . Arthritis    "knees, ankles, shoulders" (08/15/2016)  . CAD (coronary artery disease)    s/p cath in 2014 showing significant stenosis in the diagonal side branches and in the RV marginal branch. These vessels were small and diffusely diseased. He is managed medically. 08/16/16 Cath, no disease progression  . Chronic lower back pain   . Disc disease, degenerative, cervical   . GERD (gastroesophageal reflux disease)   . Gout   . Hyperlipidemia   . Hypertension   . Osteoarthritis   . Renal cell carcinoma    left kidney removal  . Stroke [facility])    MINI STROKE 15+ YRS AGO, no residual  . Stroke ([place])    "I've had 2 or 3"; denies residual on 08/15/2016  . Syncope and collapse     Past Surgical History:  Procedure Laterality Date  . ANTERIOR CERVICAL  DECOMP/DISCECTOMY FUSION  02/2004; 04/2005   [name] 5/14/2012Marland Kitchen [name] 09/20/2010  . BACK SURGERY    . CARDIAC CATHETERIZATION  12/14/2008   ef 50-55%. SHOWED SIGNIFICANT STENOSIS AND TO DIAGONAL SIDE BRANCHES AND IN THE RIGHT VENTRICULAR MARGINAL BRANCH. THESE VESSELS WERE SMALL AND DIFFUSELY DISEASED  . CARDIOVASCULAR STRESS TEST  12/10/2009   EF 61%. EKG negative. Mild peri ischemia. Managed medically.   . CARPAL TUNNEL RELEASE Right 11/2005   [name] 09/20/2010  . CARPAL TUNNEL RELEASE Left 06/2007   [name] 5/132012  . COLONOSCOPY  11/2004   [name] 09/20/2010  . COLONOSCOPY W/ BIOPSIES AND POLYPECTOMY  09/2002   [name] 09/20/2010  . INCISION AND DRAINAGE ABSCESS Right 10/18/2016   Procedure: INCISION AND DRAINAGE ABSCESS RIGHT SHOULDER;  Surgeon: [name], MD;  Location: [place];  Service: Orthopedics;  Laterality: Right;  . INCISION AND DRAINAGE OF WOUND Right 08/2005   shoulder/notes 09/20/2010  . IR FLUORO GUIDE CV LINE LEFT  10/19/2016  . IR US GUIDE VASC ACCESS LEFT  10/19/2016  . IRRIGATION AND DEBRIDEMENT SHOULDER Right 12/22/2016   Procedure: IRRIGATION AND DEBRIDEMENT RIGHT SHOULDER;  Surgeon: [name], MD;  Location: [place];  Service: Orthopedics;  Laterality: Right;  . JOINT REPLACEMENT    . LEFT HEART CATH AND CORONARY ANGIOGRAPHY N/A 08/16/2016   Procedure: Left Heart Cath and Coronary Angiography;  Surgeon: [name], MD;  Location: [place] CV LAB;  Service: Cardiovascular;  Laterality: N/A;  . LEFT HEART CATHETERIZATION WITH CORONARY ANGIOGRAM N/A 08/09/2012   Procedure: LEFT HEART CATHETERIZATION WITH CORONARY ANGIOGRAM;  Surgeon: Peter M Martinique, MD;  Location: [facility] CATH LAB;  Service: Cardiovascular;  Laterality: N/A;  . LUMBAR FUSION    . NEPHRECTOMY Left early 2000s  . REPLACEMENT TOTAL KNEE Right 08/2008   [name] 09/07/2010  . SHOULDER ARTHROSCOPY WITH ROTATOR CUFF REPAIR Right 06/2005   [name] 09/20/2010  . TOTAL KNEE ARTHROPLASTY   06/08/2011   Procedure: TOTAL KNEE ARTHROPLASTY;  Surgeon: [name], MD;  Location: [place];  Service: Orthopedics;  Laterality: Left;  . TOTAL SHOULDER ARTHROPLASTY Right 04/06/2016   Procedure: RIGHT REVERSE TOTAL SHOULDER ARTHROPLASTY;  Surgeon: [name], MD;  Location: [place];  Service: Orthopedics;  Laterality: Right;  . TOTAL SHOULDER REVISION Right 12/22/2016   Procedure: RIGHT SHOULDER GLENOID AND HUMERAL COMPONENT REVISION;  Surgeon: [name], MD;  Location: [place];  Service: Orthopedics;  Laterality: Right;  . US ECHOCARDIOGRAPHY  12/15/2008   EF 60-65%  . US ECHOCARDIOGRAPHY  09/28/2004   EF 55-60%    Family History  Problem Relation Age of Onset  . Heart attack Father   . Heart disease Brother   . Allergic rhinitis Neg Hx   . Angioedema Neg Hx   . Asthma Neg Hx   . Atopy Neg Hx   . Eczema Neg Hx   . Immunodeficiency Neg Hx   . Urticaria Neg Hx    Social History:  reports that he quit smoking about 28 years ago. He has never used smokeless tobacco. He reports that he drinks alcohol. He reports that he does not use drugs.  Allergies:  Allergies  Allergen Reactions  . Adhesive [Tape] Rash    Paper tape okay  . Latex Rash    Medications Prior to Admission  Medication Sig Dispense Refill  . aspirin EC 81 MG EC tablet Take 1 tablet (81 mg total) by mouth daily. (Patient taking differently: Take 81 mg by mouth every evening. )    . atorvastatin (LIPITOR) 80 MG tablet Take 1 tablet (80 mg total) by mouth every evening. 30 tablet 6  . clopidogrel (PLAVIX) 75 MG tablet Take 75 mg by mouth every evening.     Marland Kitchen HYDROcodone-acetaminophen (NORCO/VICODIN) 5-325 MG tablet Take 1 tablet by mouth every 6 (six) hours as needed for pain.  0  . isosorbide mononitrate (IMDUR) 60 MG 24 hr tablet TAKE 1 TABLET BY MOUTH EVERY DAY (Patient taking differently: TAKE 1 TABLET BY MOUTH EVERY DAY IN THE EVENING) 90 tablet 2  . LORazepam (ATIVAN) 0.5 MG tablet Take 0.5 mg by mouth 2  (two) times daily as needed for anxiety.   2  . metoprolol tartrate (LOPRESSOR) 50 MG tablet Take 1 tablet (50 mg total) by mouth daily. (Patient taking differently: Take 50 mg by mouth every evening. )    . olmesartan (BENICAR) 20 MG tablet Take 20 mg by mouth every evening.   1  . pantoprazole (PROTONIX) 40 MG tablet Take 40 mg by mouth every evening.     Marland Kitchen acyclovir (ZOVIRAX) 400 MG tablet Take 400 mg by mouth 3 (three) times daily as needed. For cold sores.  5  . allopurinol (ZYLOPRIM) 300 MG tablet Take 300 mg by mouth daily as needed (for gout flares).     . colchicine 0.6 MG tablet Take 0.6 mg by mouth daily as needed (  for gout flares.).    Marland Kitchen EPINEPHrine 0.3 mg/0.3 mL IJ SOAJ injection USE AS DIRECTED IF LIFE THREATENING REACTION OCCURS  11  . nitroGLYCERIN (NITROSTAT) 0.4 MG SL tablet Place 1 tablet (0.4 mg total) under the tongue every 5 (five) minutes x 3 doses as needed for chest pain. 25 tablet 2    Results for orders placed or performed during the hospital encounter of 01/01/18 (from the past 48 hour(s))  Surgical pcr screen     Status: None   Collection Time: 01/01/18  8:23 AM  Result Value Ref Range   MRSA, PCR NEGATIVE NEGATIVE   Staphylococcus aureus NEGATIVE NEGATIVE    Comment: (NOTE) The Xpert SA Assay (FDA approved for NASAL specimens in patients 78 years of age and older), is one component of a comprehensive surveillance program. It is not intended to diagnose infection nor to guide or monitor treatment. Performed at [facility], [place] [address], [place], [place] [address]   Basic metabolic panel     Status: None   Collection Time: 01/01/18  8:23 AM  Result Value Ref Range   Sodium 140 135 - 145 mmol/L   Potassium 4.3 3.5 - 5.1 mmol/L   Chloride 108 98 - 111 mmol/L   CO2 25 22 - 32 mmol/L   Glucose, Bld 95 70 - 99 mg/dL   BUN 13 8 - 23 mg/dL   Creatinine, Ser 1.11 0.61 - 1.24 mg/dL   Calcium 8.9 8.9 - 10.3 mg/dL   GFR calc non Af Amer >60 >60 mL/min    GFR calc Af Amer >60 >60 mL/min    Comment: (NOTE) The eGFR has been calculated using the CKD EPI equation. This calculation has not been validated in all clinical situations. eGFR's persistently <60 mL/min signify possible Chronic Kidney Disease.    Anion gap 7 5 - 15    Comment: Performed at [facility] [address] [place] [address]  CBC     Status: Abnormal   Collection Time: 01/01/18  8:23 AM  Result Value Ref Range   WBC 5.8 4.0 - 10.5 K/uL   RBC 4.08 (L) 4.22 - 5.81 MIL/uL   Hemoglobin 12.8 (L) 13.0 - 17.0 g/dL   HCT 39.6 39.0 - 52.0 %   MCV 97.1 78.0 - 100.0 fL   MCH 31.4 26.0 - 34.0 pg   MCHC 32.3 30.0 - 36.0 g/dL   RDW 14.3 11.5 - 15.5 %   Platelets 222 150 - 400 K/uL    Comment: Performed at [facility], [place] [address], [place], [place] [address]  Type and screen     Status: None   Collection Time: 01/01/18  8:29 AM  Result Value Ref Range   ABO/RH(D) A POS    Antibody Screen NEG    Sample Expiration      01/04/2018 Performed at [facility], [place] [address], [place], [place] [address]    No results found.  Review of Systems  Constitutional: Positive for malaise/fatigue.  HENT: Negative.   Eyes: Negative.   Respiratory: Negative.   Cardiovascular:       History of coronary artery disease  Gastrointestinal: Negative.   Genitourinary: Negative.   Musculoskeletal: Positive for back pain.  Skin: Negative.   Neurological: Positive for tingling, sensory change, focal weakness and weakness.  Endo/Heme/Allergies: Negative.   Psychiatric/Behavioral: Negative.     Blood pressure (!) 143/82, pulse (!) 58, temperature 98.5 F (36.9 C), temperature  source Oral, resp. rate 18, weight 100.7 kg, SpO2 98 %. Physical Exam  Constitutional: He is oriented to person, place, and time. He appears well-developed and well-nourished.  HENT:  Head: Normocephalic and atraumatic.  Eyes: Pupils are equal, round, and reactive to light. Conjunctivae  and EOM are normal.  Neck:  Decreased range of motion of the neck  Cardiovascular: Normal rate and regular rhythm.  Respiratory: Effort normal and breath sounds normal.  GI: Soft. Bowel sounds are normal.  Musculoskeletal:  Positive straight leg raising at 30 degrees in either lower extremity Patrick's maneuver is negative bilaterally.  Neurological: He is alert and oriented to person, place, and time.  Absent deep tendon reflexes in both lower extremities proximal weakness in the iliopsoas and the quad graded 4 out of 5.  Gait is moderately wide-based.  Upper extremity strength and reflexes are intact.  Cranial nerve examination is normal.  Rapid alternating movements and cerebellar testing is normal.  Skin: Skin is warm and dry.  Psychiatric: He has a normal mood and affect. His behavior is normal. Judgment and thought content normal.     Assessment/Plan Lumbar stenosis with radiculopathy L2-L3.  Plan decompression and fusion L2-L3 with robotic assistance and screw placement.  Earleen Newport, MD 01/02/2018, 7:42 AM

## 2018-01-02 NOTE — Transfer of Care (Signed)
Immediate Anesthesia Transfer of Care Note  Patient: Jake Dunn  Procedure(s) Performed: Lumbar Two-Three Posterior lumbar interbody fusion with Mazor (N/A Back) APPLICATION OF ROBOTIC ASSISTANCE FOR SPINAL PROCEDURE (N/A )  Patient Location: PACU  Anesthesia Type:General  Level of Consciousness: awake, alert  and oriented  Airway & Oxygen Therapy: Patient Spontanous Breathing and Patient connected to nasal cannula oxygen  Post-op Assessment: Report given to RN and Post -op Vital signs reviewed and stable  Post vital signs: Reviewed and stable  Last Vitals:  Vitals Value Taken Time  BP    Temp    Pulse 69 01/02/2018 12:00 PM  Resp 4 01/02/2018 12:00 PM  SpO2 95 % 01/02/2018 12:00 PM  Vitals shown include unvalidated device data.  Last Pain:  Vitals:   01/02/18 0621  TempSrc:   PainSc: 6          Complications: No apparent anesthesia complications

## 2018-01-02 NOTE — Progress Notes (Signed)
Patient ID: Jake Dunn, male   DOB: 1941-03-22, 77 y.o.   MRN: [id number] Vital signs are stable.  Patient is feeling reasonably comfortable.  Lower extremity function appears good.  Incisions are clean and dry.

## 2018-01-02 NOTE — Anesthesia Procedure Notes (Signed)
Arterial Line Insertion Start/End8/27/2019 7:27 AM Performed by: Clearnce Sorrel, CRNA, CRNA  Preanesthetic checklist: patient identified, IV checked, site marked, risks and benefits discussed, surgical consent, monitors and equipment checked, pre-op evaluation, timeout performed and anesthesia consent Patient sedated Right, radial was placed Catheter size: 20 G Hand hygiene performed  and maximum sterile barriers used   Attempts: 2 (1st attempt by [facility]) Procedure performed without using ultrasound guided technique. Ultrasound Notes:anatomy identified Following insertion, Biopatch and dressing applied. Post procedure assessment: normal  Patient tolerated the procedure well with no immediate complications.

## 2018-01-03 ENCOUNTER — Encounter (HOSPITAL_COMMUNITY): Payer: Self-pay | Admitting: Neurological Surgery

## 2018-01-03 LAB — BASIC METABOLIC PANEL
ANION GAP: 6 (ref 5–15)
BUN: 15 mg/dL (ref 8–23)
CO2: 25 mmol/L (ref 22–32)
Calcium: 8.3 mg/dL — ABNORMAL LOW (ref 8.9–10.3)
Chloride: 108 mmol/L (ref 98–111)
Creatinine, Ser: 1.1 mg/dL (ref 0.61–1.24)
Glucose, Bld: 134 mg/dL — ABNORMAL HIGH (ref 70–99)
POTASSIUM: 4.4 mmol/L (ref 3.5–5.1)
Sodium: 139 mmol/L (ref 135–145)

## 2018-01-03 LAB — CBC
HCT: 30.9 % — ABNORMAL LOW (ref 39.0–52.0)
Hemoglobin: 9.8 g/dL — ABNORMAL LOW (ref 13.0–17.0)
MCH: 30.7 pg (ref 26.0–34.0)
MCHC: 31.7 g/dL (ref 30.0–36.0)
MCV: 96.9 fL (ref 78.0–100.0)
PLATELETS: 199 10*3/uL (ref 150–400)
RBC: 3.19 MIL/uL — AB (ref 4.22–5.81)
RDW: 14.2 % (ref 11.5–15.5)
WBC: 10.3 10*3/uL (ref 4.0–10.5)

## 2018-01-03 MED FILL — Thrombin (Recombinant) For Soln 20000 Unit: CUTANEOUS | Qty: 1 | Status: AC

## 2018-01-03 NOTE — Progress Notes (Signed)
Patient ID: Jake Dunn, male   DOB: 03-31-1941, 77 y.o.   MRN: [id number] Vital signs are stable Foley is out and patient is sitting up at side of bed Pain seems reasonably well controlled Mobilizing well Continue to observe today Consider discharge for tomorrow if stable

## 2018-01-03 NOTE — Evaluation (Signed)
Physical Therapy Evaluation Patient Details Name: Jake Dunn: [id number] DOB: 1941/04/18 Today's Date: 01/03/2018   History of Present Illness  pt is a 77 y/o male with PMH significant for CAD, DDD, HTN, OA, RCC with nephrectomy, CVA, admitted for worsening pain at back and right hip area, found on imaging to be due to spondylosis and stenosis at L2-L3.  Pt s/p deompression with PLIF with spacers and allo/autografting.  Pt also fixated at L2-L3 posteriorly.  Clinical Impression  Pt is at or close to baseline functioning and should be safe at home with wife's assist.. There are no further acute PT needs.  Will sign off at this time.     Follow Up Recommendations No PT follow up    Equipment Recommendations  None recommended by PT    Recommendations for Other Services       Precautions / Restrictions Precautions Precautions: Back Precaution Booklet Issued: Yes (comment) Required Braces or Orthoses: Spinal Brace Spinal Brace: Lumbar corset;Applied in sitting position Restrictions Weight Bearing Restrictions: No      Mobility  Bed Mobility Overal bed mobility: Needs Assistance Bed Mobility: Rolling;Sidelying to Sit;Sit to Sidelying Rolling: Supervision Sidelying to sit: Min guard     Sit to sidelying: Min guard General bed mobility comments: pt with good carryover of logroll technique; no physical assist needed, completing from flat bed   Transfers Overall transfer level: Needs assistance   Transfers: Sit to/from Stand Sit to Stand: Supervision         General transfer comment: cues for hand placement only  Ambulation/Gait Ambulation/Gait assistance: Supervision Gait Distance (Feet): 250 Feet Assistive device: None Gait Pattern/deviations: Step-through pattern Gait velocity: slower Gait velocity interpretation: 1.31 - 2.62 ft/sec, indicative of limited community ambulator General Gait Details: slower and mildly weak kneed gait.  Mild foot drop R  LE  Stairs Stairs: Yes Stairs assistance: Supervision Stair Management: One rail Right;Step to pattern;Forwards Number of Stairs: 5 General stair comments: safe with rail  Wheelchair Mobility    Modified Rankin (Stroke Patients Only)       Balance Overall balance assessment: Mild deficits observed, not formally tested                                           Pertinent Vitals/Pain Pain Assessment: Faces Pain Score: 4  Faces Pain Scale: Hurts little more Pain Location: incision Pain Descriptors / Indicators: Discomfort Pain Intervention(s): Monitored during session;Repositioned    Home Living Family/patient expects to be discharged to:: Private residence Living Arrangements: Spouse/significant other Available Help at Discharge: Family;Available 24 hours/day Type of Home: House Home Access: Stairs to enter Entrance Stairs-Rails: Right Entrance Stairs-Number of Steps: 3 Home Layout: One level Home Equipment: Walker - 2 wheels;Crutches      Prior Function Level of Independence: Independent         Comments: driving, working     Journalist, newspaper        Extremity/Trunk Assessment   Upper Extremity Assessment Upper Extremity Assessment: Overall WFL for tasks assessed    Lower Extremity Assessment Lower Extremity Assessment: Defer to PT evaluation    Cervical / Trunk Assessment Cervical / Trunk Assessment: Normal  Communication   Communication: No difficulties  Cognition Arousal/Alertness: Awake/alert Behavior During Therapy: WFL for tasks assessed/performed Overall Cognitive Status: Within Functional Limits for tasks assessed  General Comments General comments (skin integrity, edema, etc.): pt instructed in all back care/prec, including bed mobility, lifting precautions, bracing issues and progression of activity.    Exercises     Assessment/Plan    PT Assessment Patent  does not need any further PT services  PT Problem List         PT Treatment Interventions      PT Goals (Current goals can be found in the Care Plan section)  Acute Rehab PT Goals Patient Stated Goal: home PT Goal Formulation: All assessment and education complete, DC therapy    Frequency     Barriers to discharge        Co-evaluation               AM-PAC PT "6 Clicks" Daily Activity  Outcome Measure Difficulty turning over in bed (including adjusting bedclothes, sheets and blankets)?: None Difficulty moving from lying on back to sitting on the side of the bed? : A Little Difficulty sitting down on and standing up from a chair with arms (e.g., wheelchair, bedside commode, etc,.)?: None Help needed moving to and from a bed to chair (including a wheelchair)?: A Little Help needed walking in hospital room?: A Little Help needed climbing 3-5 steps with a railing? : A Little 6 Click Score: 20    End of Session   Activity Tolerance: Patient tolerated treatment well Patient left: in chair;with call bell/phone within reach Nurse Communication: Mobility status PT Visit Diagnosis: Other abnormalities of gait and mobility (R26.89);Pain Pain - part of body: (back)    Time: 1010-1034 PT Time Calculation (min) (ACUTE ONLY): 24 min   Charges:   PT Evaluation $PT Eval Low Complexity: 1 Low PT Treatments $Gait Training: 8-22 mins        01/03/2018  Donnella Sham, PT [phone]  (pager)  [name] 01/03/2018, 2:41 PM

## 2018-01-03 NOTE — Social Work (Signed)
Pt currently with no therapy follow up recommended, CSW signing off. Please consult if any additional needs arise.   Alexander Mt, [place] Work [phone]

## 2018-01-03 NOTE — Social Work (Signed)
CSW acknowledging consult for SNF placement, await therapy recommendations needed for pt's insurance authorization if SNF recommended.   Alexander Mt, [place] Work [phone]

## 2018-01-03 NOTE — Evaluation (Signed)
Occupational Therapy Evaluation Patient Details Name: Jake Dunn MRN: [id number] DOB: 22-Mar-1941 Today's Date: 01/03/2018    History of Present Illness pt is a 77 y/o male with PMH significant for CAD, DDD, HTN, OA, RCC with nephrectomy, CVA, admitted for worsening pain at back and right hip area, found on imaging to be due to spondylosis and stenosis at L2-L3.  Pt s/p deompression with PLIF with spacers and allo/autografting.  Pt also fixated at L2-L3 posteriorly.   Clinical Impression   This 77 y/o male presents with the above. At baseline pt reports independence with ADLs, iADLs and functional mobility, was driving and working. Pt demonstrating room level functional mobility without AD and overall minguard assist. Currently requires setup-minguard assist for UB ADL, min-modA for LB ADL secondary to adhering to back precautions. Pt's spouse present during session and reports she will be able to assist with ADLs PRN after discharge home. Educated pt on back precautions, brace management, AE, safety and compensatory strategies for completing ADLs and functionanl transfers while maintaining precautions with pt verbalizing understanding, questions answered throughout. Feel pt is safe to return home from OT stand point once medically ready. No further acute OT needs identified at this time. Will sign off.     Follow Up Recommendations  No OT follow up;Supervision/Assistance - 24 hour    Equipment Recommendations  None recommended by OT           Precautions / Restrictions Precautions Precautions: Back Precaution Booklet Issued: Yes (comment) Required Braces or Orthoses: Spinal Brace Spinal Brace: Lumbar corset;Applied in sitting position Restrictions Weight Bearing Restrictions: No      Mobility Bed Mobility Overal bed mobility: Needs Assistance Bed Mobility: Rolling;Sidelying to Sit;Sit to Sidelying Rolling: Supervision Sidelying to sit: Min guard     Sit to sidelying: Min  guard General bed mobility comments: pt with good carryover of logroll technique; no physical assist needed, completing from flat bed   Transfers Overall transfer level: Needs assistance   Transfers: Sit to/from Stand Sit to Stand: Supervision         General transfer comment: cues for hand placement only    Balance Overall balance assessment: Mild deficits observed, not formally tested                                         ADL either performed or assessed with clinical judgement   ADL Overall ADL's : Needs assistance/impaired Eating/Feeding: Independent;Sitting   Grooming: Min guard;Standing   Upper Body Bathing: Min guard;Sitting   Lower Body Bathing: Minimal assistance;Sit to/from stand   Upper Body Dressing : Min guard;Sitting Upper Body Dressing Details (indicate cue type and reason): verbally reviewed brace management with pt verbalizing understanding  Lower Body Dressing: Moderate assistance;Sit to/from stand Lower Body Dressing Details (indicate cue type and reason): educated on use of reacher for LB dressing, pt's spouse present and reporting she will also assist with completion of LB ADLs  Toilet Transfer: Min guard;Ambulation;Comfort height toilet Toilet Transfer Details (indicate cue type and reason): simulated in transfer to/from recliner  Toileting- Water quality scientist and Hygiene: Min guard;Sit to/from Nurse, children's Details (indicate cue type and reason): educated on safety during bathing task  Functional mobility during ADLs: Min guard General ADL Comments: educated on back precautions, brace management, AE, safety and compensatory strategies for completing ADLs and functional transfers  Pertinent Vitals/Pain Pain Assessment: Faces Pain Score: 4  Faces Pain Scale: Hurts little more Pain Location: incision Pain Descriptors / Indicators: Discomfort Pain Intervention(s): Monitored  during session;Repositioned     Hand Dominance     Extremity/Trunk Assessment Upper Extremity Assessment Upper Extremity Assessment: Overall WFL for tasks assessed   Lower Extremity Assessment Lower Extremity Assessment: Defer to PT evaluation   Cervical / Trunk Assessment Cervical / Trunk Assessment: Normal   Communication Communication Communication: No difficulties   Cognition Arousal/Alertness: Awake/alert Behavior During Therapy: WFL for tasks assessed/performed Overall Cognitive Status: Within Functional Limits for tasks assessed                                     General Comments  pt instructed in all back care/prec, including bed mobility, lifting precautions, bracing issues and progression of activity.    Exercises     Shoulder Instructions      Home Living Family/patient expects to be discharged to:: Private residence Living Arrangements: Spouse/significant other Available Help at Discharge: Family;Available 24 hours/day Type of Home: House Home Access: Stairs to enter [employer] of Steps: 3 Entrance Stairs-Rails: Right Home Layout: One level     Bathroom Shower/Tub: Tub/shower unit;Walk-in shower   Bathroom Toilet: Handicapped height     Home Equipment: Walker - 2 wheels;Crutches          Prior Functioning/Environment Level of Independence: Independent        Comments: driving, working        OT Problem List: Decreased strength;Impaired balance (sitting and/or standing);Decreased knowledge of precautions;Decreased activity tolerance;Decreased knowledge of use of DME or AE      OT Treatment/Interventions:      OT Goals(Current goals can be found in the care plan section) Acute Rehab OT Goals Patient Stated Goal: home OT Goal Formulation: All assessment and education complete, DC therapy                                 AM-PAC PT "6 Clicks" Daily Activity     Outcome Measure Help from another  person eating meals?: None Help from another person taking care of personal grooming?: None Help from another person toileting, which includes using toliet, bedpan, or urinal?: A Little Help from another person bathing (including washing, rinsing, drying)?: A Little Help from another person to put on and taking off regular upper body clothing?: None Help from another person to put on and taking off regular lower body clothing?: A Little 6 Click Score: 21   End of Session Equipment Utilized During Treatment: Back brace Nurse Communication: Mobility status  Activity Tolerance: Patient tolerated treatment well Patient left: in chair;with call bell/phone within reach;with family/visitor present  OT Visit Diagnosis: Other abnormalities of gait and mobility (R26.89)                Time: 1030-1053 OT Time Calculation (min): 23 min Charges:  OT General Charges $OT Visit: 1 Visit OT Evaluation $OT Eval Low Complexity: 1 Low  Lou Cal, OT Pager [phone] 01/03/2018   Raymondo Band 01/03/2018, 1:12 PM

## 2018-01-04 MED ORDER — OXYCODONE-ACETAMINOPHEN 5-325 MG PO TABS: 1.0000 | ORAL_TABLET | ORAL | 0 refills | Status: DC | PRN

## 2018-01-04 MED ORDER — DIAZEPAM 5 MG PO TABS: 5.0000 mg | ORAL_TABLET | Freq: Four times a day (QID) | ORAL | 0 refills | Status: DC | PRN

## 2018-01-04 NOTE — Anesthesia Postprocedure Evaluation (Signed)
Anesthesia Post Note  Patient: Jake Dunn  Procedure(s) Performed: Lumbar Two-Three Posterior lumbar interbody fusion with Mazor (N/A Back) APPLICATION OF ROBOTIC ASSISTANCE FOR SPINAL PROCEDURE (N/A )     Patient location during evaluation: PACU Anesthesia Type: General Level of consciousness: awake Pain management: pain level controlled Vital Signs Assessment: post-procedure vital signs reviewed and stable Respiratory status: spontaneous breathing Cardiovascular status: stable Postop Assessment: no apparent nausea or vomiting Anesthetic complications: no    Last Vitals:  Vitals:   01/03/18 2017 01/03/18 2343  BP: 118/77 133/80  Pulse: 63 62  Resp:    Temp: 37.1 C 37 C  SpO2: 95% 96%    Last Pain:  Vitals:   01/03/18 2343  TempSrc: Oral  PainSc:    Pain Goal: Patients Stated Pain Goal: 2 (01/03/18 0800)               Nira Visscher JR,JOHN Mateo Flow

## 2018-01-04 NOTE — Progress Notes (Signed)
Pt with d/c orders. Discharge paperwork reviewed with pt and wife and all questions answered. Iv removed. Printed prescription with pt at time of d/c. No equipment needed. Pt took to vehicle via wheelchair.

## 2018-01-04 NOTE — Progress Notes (Signed)
Patient ID: Jake Dunn, male   DOB: 1940-10-09, 77 y.o.   MRN: [id number] Vital signs are stable Motor function appears good Incisions are clean and dry Patient is independent in ambulation Plan discharge today

## 2018-01-04 NOTE — Discharge Summary (Signed)
Physician Discharge Summary  Patient ID: NESTOR WIENEKE MRN: [id number] DOB/AGE: Jul 28, 1940 77 y.o.  Admit date: 01/02/2018 Discharge date: 01/04/2018  Admission Diagnoses: Lumbar stenosis L2-L3 with neurogenic claudication and lumbar radiculopathy  Discharge Diagnoses: Lumbar stenosis L2-L3 with neurogenic claudication lumbar radiculopathy.  History of fusion L3 to sacrum.  Acute blood loss anemia. Active Problems:   Spinal stenosis of lumbar region with radiculopathy   Discharged Condition: good  Hospital Course: Patient was admitted to undergo surgical decompression and stabilization at L2-L3.  He tolerated surgery well and has been ambulatory postoperatively.  He did have acute blood loss anemia with a 3 g drop in hemoglobin from preoperative to postoperative.  He did not require transfusion.  Consults: None  Significant Diagnostic Studies: None  Treatments: surgery: Decompression and fusion L2-L3 using robotic screw placement at L2-L3 and posterior lumbar interbody arthrodesis with peek spacer local autograft allograft and infuse  Discharge Exam: Blood pressure 129/70, pulse 64, temperature 98.1 F (36.7 C), temperature source Oral, resp. rate 16, weight 100.7 kg, SpO2 98 %. Incision is clean and dry.  Station and gait are intact.  Disposition: Discharge disposition: 01-Home or Self Care       Discharge Instructions    Call MD for:  redness, tenderness, or signs of infection (pain, swelling, redness, odor or green/yellow discharge around incision site)   Complete by:  As directed    Call MD for:  severe uncontrolled pain   Complete by:  As directed    Call MD for:  temperature >100.4   Complete by:  As directed    Diet - low sodium heart healthy   Complete by:  As directed    Discharge instructions   Complete by:  As directed    Okay to shower. Do not apply salves or appointments to incision. No heavy lifting with the upper extremities greater than 15 pounds. May  resume driving when not requiring pain medication and patient feels comfortable with doing so.   Incentive spirometry RT   Complete by:  As directed    Increase activity slowly   Complete by:  As directed      Allergies as of 01/04/2018      Reactions   Adhesive [tape] Rash   Paper tape okay   Latex Rash      Medication List    TAKE these medications   acyclovir 400 MG tablet Commonly known as:  ZOVIRAX Take 400 mg by mouth 3 (three) times daily as needed. For cold sores.   allopurinol 300 MG tablet Commonly known as:  ZYLOPRIM Take 300 mg by mouth daily as needed (for gout flares).   aspirin 81 MG EC tablet Take 1 tablet (81 mg total) by mouth daily. What changed:  when to take this   atorvastatin 80 MG tablet Commonly known as:  LIPITOR Take 1 tablet (80 mg total) by mouth every evening.   clopidogrel 75 MG tablet Commonly known as:  PLAVIX Take 75 mg by mouth every evening.   colchicine 0.6 MG tablet Take 0.6 mg by mouth daily as needed (for gout flares.).   diazepam 5 MG tablet Commonly known as:  VALIUM Take 1 tablet (5 mg total) by mouth every 6 (six) hours as needed for muscle spasms.   EPINEPHrine 0.3 mg/0.3 mL Soaj injection Commonly known as:  EPI-PEN USE AS DIRECTED IF LIFE THREATENING REACTION OCCURS   HYDROcodone-acetaminophen 5-325 MG tablet Commonly known as:  NORCO/VICODIN Take 1 tablet by mouth every 6 (  six) hours as needed for pain.   isosorbide mononitrate 60 MG 24 hr tablet Commonly known as:  IMDUR TAKE 1 TABLET BY MOUTH EVERY DAY What changed:    how much to take  how to take this  when to take this   LORazepam 0.5 MG tablet Commonly known as:  ATIVAN Take 0.5 mg by mouth 2 (two) times daily as needed for anxiety.   metoprolol tartrate 50 MG tablet Commonly known as:  LOPRESSOR Take 1 tablet (50 mg total) by mouth daily. What changed:  when to take this   nitroGLYCERIN 0.4 MG SL tablet Commonly known as:  NITROSTAT Place 1  tablet (0.4 mg total) under the tongue every 5 (five) minutes x 3 doses as needed for chest pain.   olmesartan 20 MG tablet Commonly known as:  BENICAR Take 20 mg by mouth every evening.   oxyCODONE-acetaminophen 5-325 MG tablet Commonly known as:  PERCOCET/ROXICET Take 1-2 tablets by mouth every 3 (three) hours as needed for severe pain.   pantoprazole 40 MG tablet Commonly known as:  PROTONIX Take 40 mg by mouth every evening.        SignedEarleen Newport 01/04/2018, 9:17 AM

## 2018-01-04 NOTE — Consult Note (Signed)
[facility] Primary Care Navigator  01/04/2018  NIMROD WENDT 1941/03/20 [id number]   Met withpatientat the bedsideto identify possible discharge needs. Patientreports having"back pain radiating down to both legs- mostly right". He was admitted to undergo surgical decompression and stabilization at L2- L3. (lumbar stenosis L2-L3 with neurogenic claudication and lumbar radiculopathy, history of fusion L3 to sacrum, acute blood loss anemia status post Decompression and fusion L2-L3)  Patientendorses Dr.Cynthia White with [facility] as his primary care provider.   [facility] [address] [facility].   Patient reportsmanaginghismedications at home with wife's assistance using "pill box" system filled once a week.  Patient verbalized that he has been driving prior to admission/ surgery but statesthat wife [name]) will be able to providetransportation to his doctors' appointments after discharge.  Patientliveswith wife who will serve as his primary caregiver at home.  Anticipateddischarge planishomeaccording to patient.  Patientvoiced understanding to call primary care provider's officewhenhe returns homefor a post discharge follow-up within1- 2weeksor sooner if needs arise. Patient letter (with PCP's contact number) was provided asareminder.   Discussed with patient about[facility] CM services available for health management and resources at home buthe denies needs or concerns at this time.Patient states being able to manage health issues so far with diet, exercise, medications and follow-up with provider when needed.   Encouraged patientto seekreferral from primary care provider to [facility] care management if deemednecessaryand appropriate for any services in the future.  [facility] care management information was provided for future needs thathemay  have.  Patienthad opted and verbally agreed forEMMI calls to follow-upwith hisrecovery at home.  Referral made for [facility] General calls after discharge.   For additional questions please contact:  Edwena Felty A. Teisha Trowbridge, BSN, RN-BC [facility] PRIMARY CARE Navigator Cell: [phone]

## 2018-01-10 ENCOUNTER — Other Ambulatory Visit: Payer: Self-pay | Admitting: *Deleted

## 2018-01-10 NOTE — Patient Outreach (Signed)
[facility]) Care Management  01/10/2018  Jake Dunn 1940-11-07 [id number]   EMMI- general discharge  RED ON EMMI ALERT Day # 4 Date:  01/05/18 1218 Red Alert Reason: sad/hopeless/anxious/empty? Yes    Outreach attempt # 1 successful at the home number   Patient' wife [name], his POA is able to verify HIPAA [facility] Care Management RN reviewed and addressed red alert with patient Spoke with his wife, [name] who reports she was the person who answered the EMMI call on 01/05/18 and she report the answer sad/hopeless/anxious/empty? Yes is incorrect "He is fine and not having a problem." Mrs Both confirms f/u appointments with Dr Dema Severin Primary MD on 01/23/18 and with surgeon Dr Ellene Route on 01/17/18  [facility] RN CM reviewed [facility] services with patient.  She denies need of services from [facility]/Telephonic RN CM, pharmacy, health coach, NP or SW at this time   Conditions Spinal stenosis of lumbar region with radiculopathy, HTN, CAD, stroke, unstable angina, osteoarthritis of right shoulder , hyperlipidemia,   Advised patient that there will be further automated EMMI- post discharge calls to assess how the patient is doing following the recent hospitalization Advised the patient that another call may be received from a nurse if any of their responses were abnormal. Patient voiced understanding and was appreciative of f/u call.   Plan: [facility] RN CM will close case at this time as patient has been assessed and no needs identified.     Kimberly L. Lavina Hamman, RN, BSN, [place] Coordinator Office number [phone] Mobile number [phone]  Main [facility] number [phone] Fax number [phone]

## 2018-01-15 DIAGNOSIS — R69 Illness, unspecified: Secondary | ICD-10-CM | POA: Diagnosis not present

## 2018-01-15 DIAGNOSIS — G894 Chronic pain syndrome: Secondary | ICD-10-CM | POA: Diagnosis not present

## 2018-01-15 DIAGNOSIS — Z9889 Other specified postprocedural states: Secondary | ICD-10-CM | POA: Diagnosis not present

## 2018-01-17 DIAGNOSIS — M48062 Spinal stenosis, lumbar region with neurogenic claudication: Secondary | ICD-10-CM | POA: Diagnosis not present

## 2018-01-17 DIAGNOSIS — Z6834 Body mass index (BMI) 34.0-34.9, adult: Secondary | ICD-10-CM | POA: Diagnosis not present

## 2018-01-22 IMAGING — CR DG CHEST 2V
2 series · 2 of 2 positions shown · non-contrast
Comparison: 08/07/2012

CLINICAL DATA: Cough, shortness of breath

EXAM:
CHEST  2 VIEW

[chest pa]
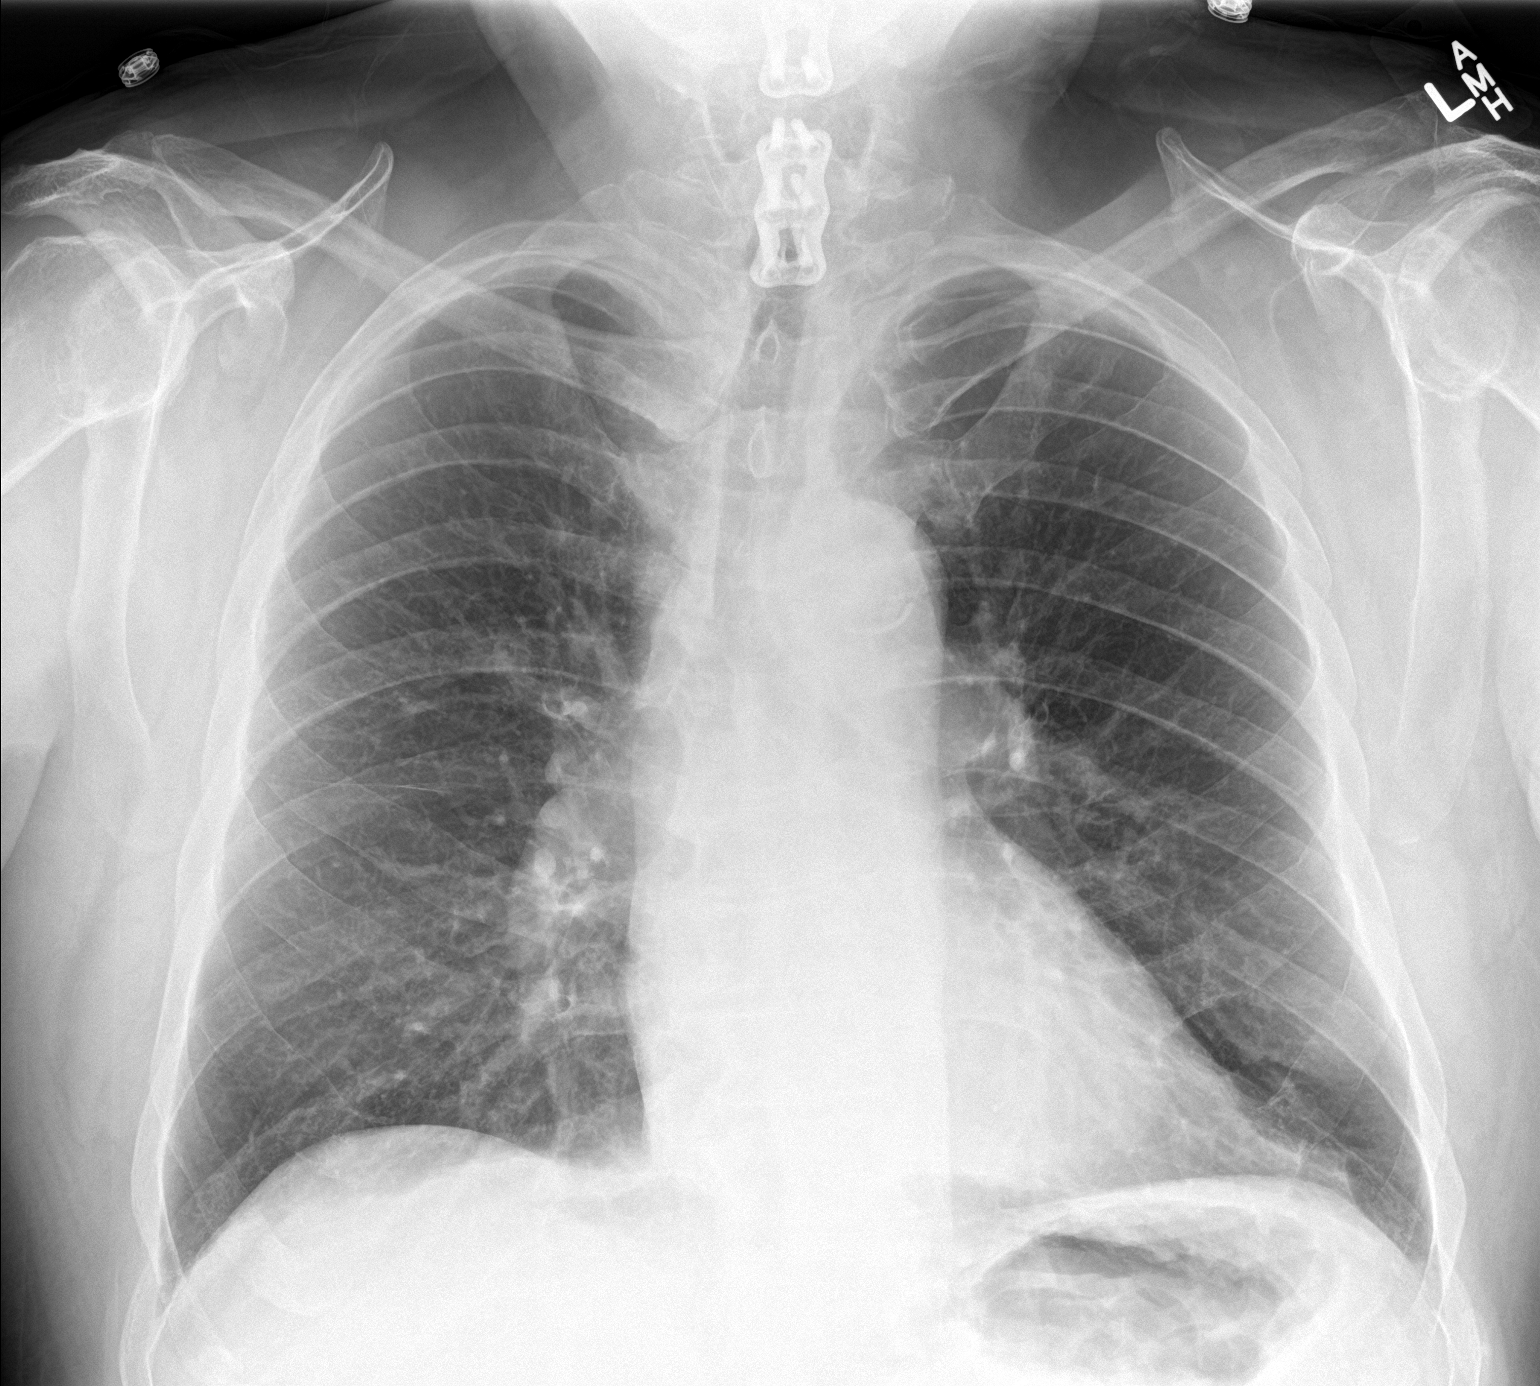

[chest lat]
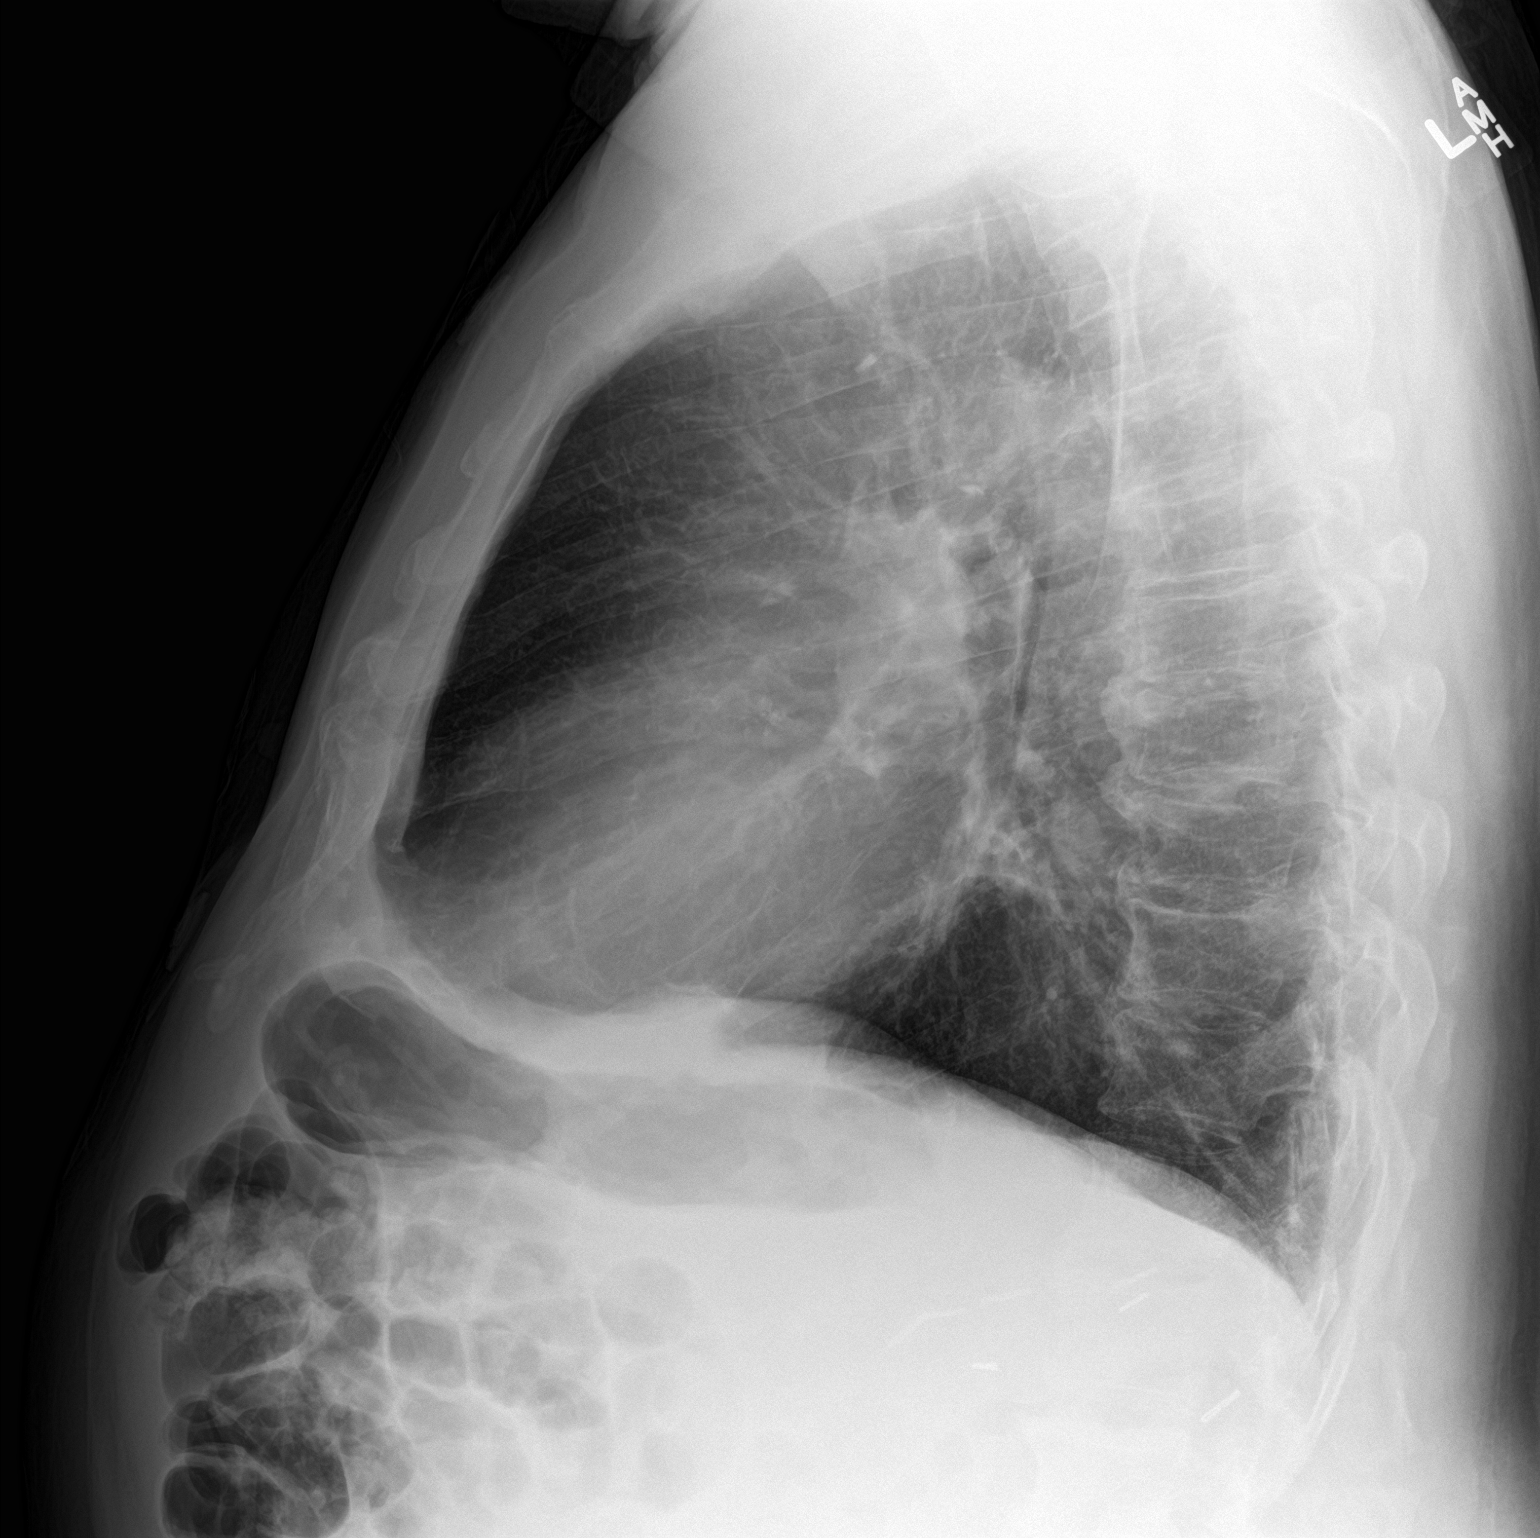

[2 of 2 positions shown; findings below may reference images not displayed]

FINDINGS: Lungs are clear.  No pleural effusion or pneumothorax.

The heart is normal in size.

Degenerative changes of the visualized thoracolumbar spine.

Cervical spine fixation hardware.
IMPRESSION: No evidence of acute cardiopulmonary disease.

## 2018-02-15 DIAGNOSIS — Z7982 Long term (current) use of aspirin: Secondary | ICD-10-CM | POA: Diagnosis not present

## 2018-02-15 DIAGNOSIS — I10 Essential (primary) hypertension: Secondary | ICD-10-CM | POA: Diagnosis not present

## 2018-02-15 DIAGNOSIS — E669 Obesity, unspecified: Secondary | ICD-10-CM | POA: Diagnosis not present

## 2018-02-15 DIAGNOSIS — Z7902 Long term (current) use of antithrombotics/antiplatelets: Secondary | ICD-10-CM | POA: Diagnosis not present

## 2018-02-15 DIAGNOSIS — I2510 Atherosclerotic heart disease of native coronary artery without angina pectoris: Secondary | ICD-10-CM | POA: Diagnosis not present

## 2018-02-15 DIAGNOSIS — K219 Gastro-esophageal reflux disease without esophagitis: Secondary | ICD-10-CM | POA: Diagnosis not present

## 2018-02-15 DIAGNOSIS — R69 Illness, unspecified: Secondary | ICD-10-CM | POA: Diagnosis not present

## 2018-02-15 DIAGNOSIS — Z6833 Body mass index (BMI) 33.0-33.9, adult: Secondary | ICD-10-CM | POA: Diagnosis not present

## 2018-02-15 DIAGNOSIS — M109 Gout, unspecified: Secondary | ICD-10-CM | POA: Diagnosis not present

## 2018-02-15 DIAGNOSIS — E785 Hyperlipidemia, unspecified: Secondary | ICD-10-CM | POA: Diagnosis not present

## 2018-02-27 DIAGNOSIS — G894 Chronic pain syndrome: Secondary | ICD-10-CM | POA: Diagnosis not present

## 2018-02-27 DIAGNOSIS — R69 Illness, unspecified: Secondary | ICD-10-CM | POA: Diagnosis not present

## 2018-02-27 DIAGNOSIS — Z23 Encounter for immunization: Secondary | ICD-10-CM | POA: Diagnosis not present

## 2018-02-27 DIAGNOSIS — I10 Essential (primary) hypertension: Secondary | ICD-10-CM | POA: Diagnosis not present

## 2018-03-01 DIAGNOSIS — I10 Essential (primary) hypertension: Secondary | ICD-10-CM | POA: Diagnosis not present

## 2018-03-01 DIAGNOSIS — Z6836 Body mass index (BMI) 36.0-36.9, adult: Secondary | ICD-10-CM | POA: Diagnosis not present

## 2018-03-01 DIAGNOSIS — M48062 Spinal stenosis, lumbar region with neurogenic claudication: Secondary | ICD-10-CM | POA: Diagnosis not present

## 2018-04-26 DIAGNOSIS — I10 Essential (primary) hypertension: Secondary | ICD-10-CM | POA: Diagnosis not present

## 2018-04-26 DIAGNOSIS — Z6835 Body mass index (BMI) 35.0-35.9, adult: Secondary | ICD-10-CM | POA: Diagnosis not present

## 2018-04-26 DIAGNOSIS — M48062 Spinal stenosis, lumbar region with neurogenic claudication: Secondary | ICD-10-CM | POA: Diagnosis not present

## 2018-05-08 ENCOUNTER — Other Ambulatory Visit: Payer: Self-pay | Admitting: Neurological Surgery

## 2018-05-08 DIAGNOSIS — M48062 Spinal stenosis, lumbar region with neurogenic claudication: Secondary | ICD-10-CM

## 2018-05-10 ENCOUNTER — Ambulatory Visit
Admission: RE | Admit: 2018-05-10 | Discharge: 2018-05-10 | Disposition: A | Discharge status: Home / Self Care | Payer: Medicare HMO | Source: Ambulatory Visit | Attending: Neurological Surgery | Admitting: Neurological Surgery

## 2018-05-10 DIAGNOSIS — M545 Low back pain: Secondary | ICD-10-CM | POA: Diagnosis not present

## 2018-05-10 DIAGNOSIS — M48062 Spinal stenosis, lumbar region with neurogenic claudication: Secondary | ICD-10-CM

## 2018-05-17 DIAGNOSIS — S32009K Unspecified fracture of unspecified lumbar vertebra, subsequent encounter for fracture with nonunion: Secondary | ICD-10-CM | POA: Diagnosis not present

## 2018-05-31 DIAGNOSIS — G894 Chronic pain syndrome: Secondary | ICD-10-CM | POA: Diagnosis not present

## 2018-05-31 DIAGNOSIS — R69 Illness, unspecified: Secondary | ICD-10-CM | POA: Diagnosis not present

## 2018-05-31 DIAGNOSIS — I2510 Atherosclerotic heart disease of native coronary artery without angina pectoris: Secondary | ICD-10-CM | POA: Diagnosis not present

## 2018-05-31 DIAGNOSIS — Z8673 Personal history of transient ischemic attack (TIA), and cerebral infarction without residual deficits: Secondary | ICD-10-CM | POA: Diagnosis not present

## 2018-05-31 DIAGNOSIS — K219 Gastro-esophageal reflux disease without esophagitis: Secondary | ICD-10-CM | POA: Diagnosis not present

## 2018-05-31 DIAGNOSIS — M5136 Other intervertebral disc degeneration, lumbar region: Secondary | ICD-10-CM | POA: Diagnosis not present

## 2018-05-31 DIAGNOSIS — E785 Hyperlipidemia, unspecified: Secondary | ICD-10-CM | POA: Diagnosis not present

## 2018-05-31 DIAGNOSIS — M109 Gout, unspecified: Secondary | ICD-10-CM | POA: Diagnosis not present

## 2018-05-31 DIAGNOSIS — I10 Essential (primary) hypertension: Secondary | ICD-10-CM | POA: Diagnosis not present

## 2018-06-05 DIAGNOSIS — M960 Pseudarthrosis after fusion or arthrodesis: Secondary | ICD-10-CM | POA: Diagnosis not present

## 2018-09-27 IMAGING — DX DG SHOULDER 2+V PORT*R*
1 series · 1 of 1 positions shown · non-contrast
Comparison: 10/28/2015

CLINICAL DATA: Right shoulder replacement.

EXAM:
PORTABLE RIGHT SHOULDER

[shoulder ap]
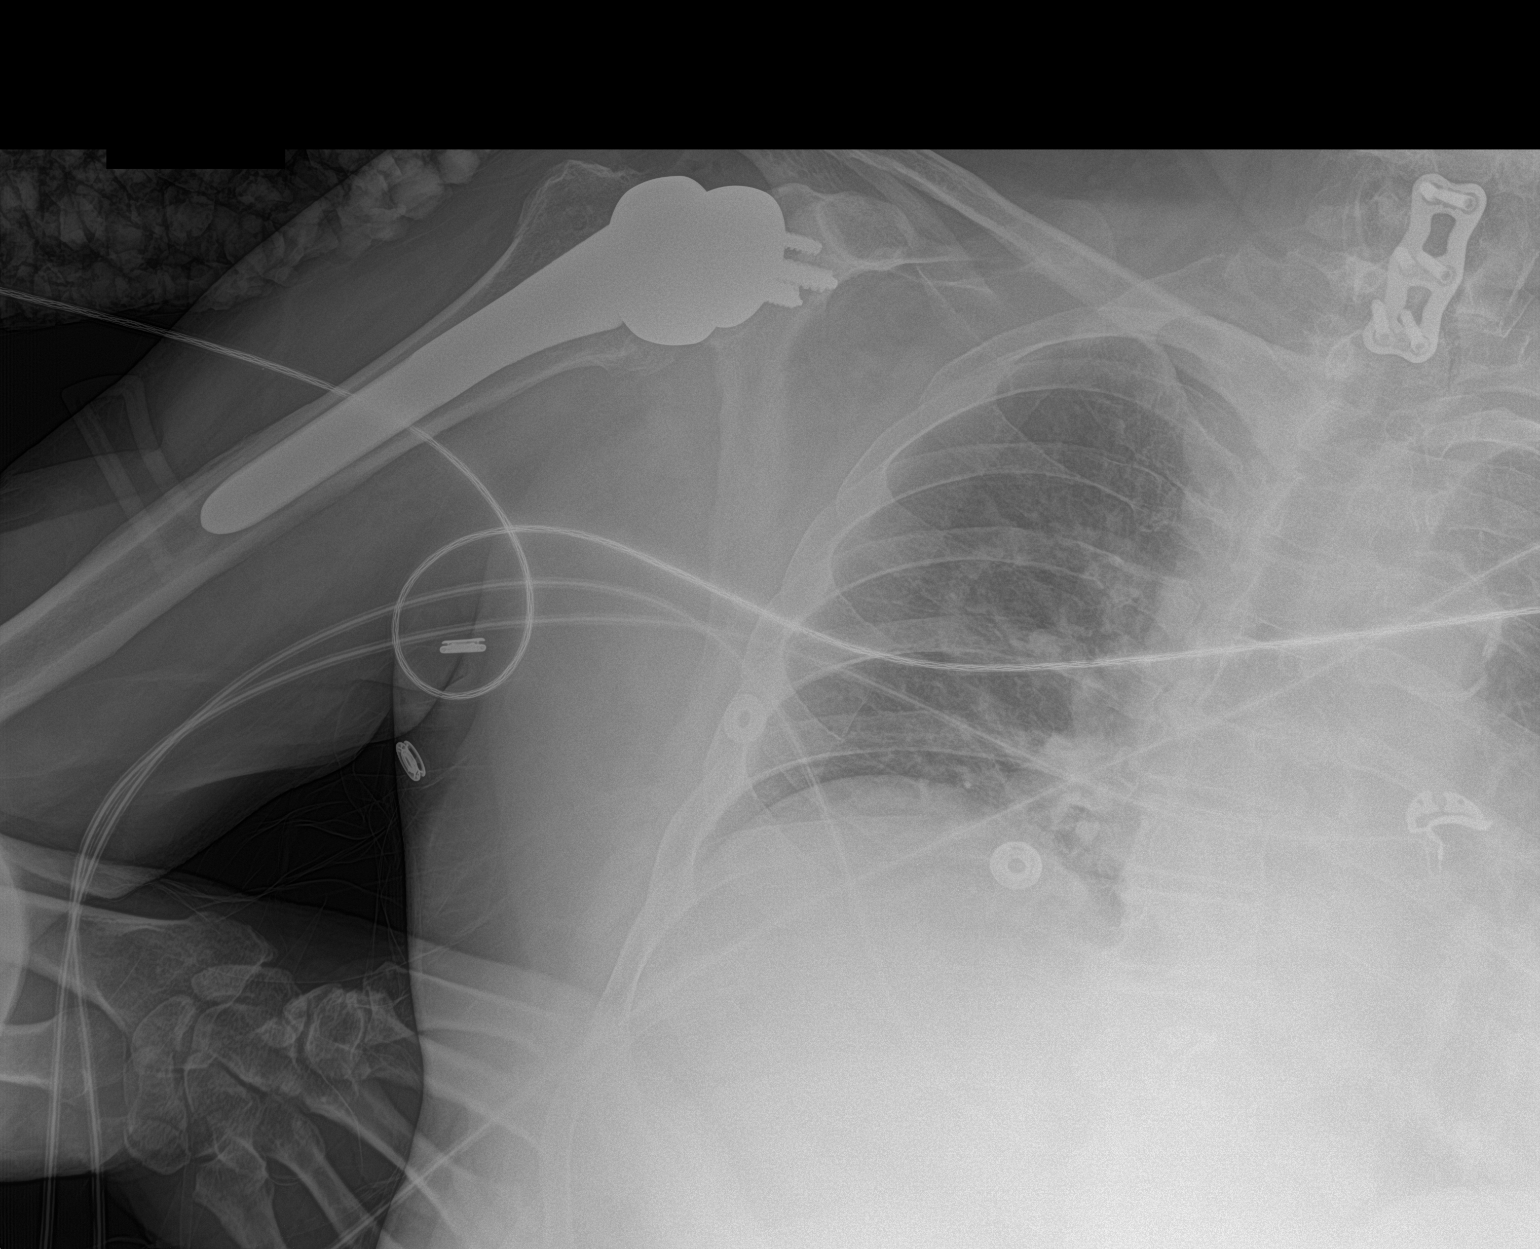

[1 of 1 positions shown; findings below may reference images not displayed]

FINDINGS: Single image shows short arthroplasty on the right. Components
appear grossly well positioned. No radiographically detectable
complication.
IMPRESSION: Right shoulder arthroplasty.

## 2018-09-28 DIAGNOSIS — E785 Hyperlipidemia, unspecified: Secondary | ICD-10-CM | POA: Diagnosis not present

## 2018-09-28 DIAGNOSIS — Z8673 Personal history of transient ischemic attack (TIA), and cerebral infarction without residual deficits: Secondary | ICD-10-CM | POA: Diagnosis not present

## 2018-09-28 DIAGNOSIS — M170 Bilateral primary osteoarthritis of knee: Secondary | ICD-10-CM | POA: Diagnosis not present

## 2018-09-28 DIAGNOSIS — M79642 Pain in left hand: Secondary | ICD-10-CM | POA: Diagnosis not present

## 2018-09-28 DIAGNOSIS — R69 Illness, unspecified: Secondary | ICD-10-CM | POA: Diagnosis not present

## 2018-09-28 DIAGNOSIS — M79641 Pain in right hand: Secondary | ICD-10-CM | POA: Diagnosis not present

## 2018-09-28 DIAGNOSIS — I10 Essential (primary) hypertension: Secondary | ICD-10-CM | POA: Diagnosis not present

## 2018-09-28 DIAGNOSIS — M5136 Other intervertebral disc degeneration, lumbar region: Secondary | ICD-10-CM | POA: Diagnosis not present

## 2018-09-28 DIAGNOSIS — G894 Chronic pain syndrome: Secondary | ICD-10-CM | POA: Diagnosis not present

## 2018-10-04 DIAGNOSIS — N400 Enlarged prostate without lower urinary tract symptoms: Secondary | ICD-10-CM | POA: Diagnosis not present

## 2018-10-04 DIAGNOSIS — N5201 Erectile dysfunction due to arterial insufficiency: Secondary | ICD-10-CM | POA: Diagnosis not present

## 2018-10-12 DIAGNOSIS — S32009K Unspecified fracture of unspecified lumbar vertebra, subsequent encounter for fracture with nonunion: Secondary | ICD-10-CM | POA: Diagnosis not present

## 2018-11-07 DIAGNOSIS — G894 Chronic pain syndrome: Secondary | ICD-10-CM | POA: Diagnosis not present

## 2018-11-07 DIAGNOSIS — R69 Illness, unspecified: Secondary | ICD-10-CM | POA: Diagnosis not present

## 2018-11-07 DIAGNOSIS — I10 Essential (primary) hypertension: Secondary | ICD-10-CM | POA: Diagnosis not present

## 2018-11-07 DIAGNOSIS — M5136 Other intervertebral disc degeneration, lumbar region: Secondary | ICD-10-CM | POA: Diagnosis not present

## 2018-11-07 DIAGNOSIS — M170 Bilateral primary osteoarthritis of knee: Secondary | ICD-10-CM | POA: Diagnosis not present

## 2018-11-15 DIAGNOSIS — M5416 Radiculopathy, lumbar region: Secondary | ICD-10-CM | POA: Diagnosis not present

## 2018-11-15 DIAGNOSIS — M5136 Other intervertebral disc degeneration, lumbar region: Secondary | ICD-10-CM | POA: Diagnosis not present

## 2019-01-07 DIAGNOSIS — E785 Hyperlipidemia, unspecified: Secondary | ICD-10-CM | POA: Diagnosis not present

## 2019-01-07 DIAGNOSIS — M170 Bilateral primary osteoarthritis of knee: Secondary | ICD-10-CM | POA: Diagnosis not present

## 2019-01-07 DIAGNOSIS — I10 Essential (primary) hypertension: Secondary | ICD-10-CM | POA: Diagnosis not present

## 2019-01-07 DIAGNOSIS — I25119 Atherosclerotic heart disease of native coronary artery with unspecified angina pectoris: Secondary | ICD-10-CM | POA: Diagnosis not present

## 2019-01-07 DIAGNOSIS — I2510 Atherosclerotic heart disease of native coronary artery without angina pectoris: Secondary | ICD-10-CM | POA: Diagnosis not present

## 2019-01-08 ENCOUNTER — Other Ambulatory Visit: Payer: Self-pay | Admitting: Cardiology

## 2019-01-08 MED ORDER — NITROGLYCERIN 0.4 MG SL SUBL: 0.4000 mg | SUBLINGUAL_TABLET | SUBLINGUAL | 2 refills | Status: DC | PRN

## 2019-01-08 MED ORDER — ISOSORBIDE MONONITRATE ER 60 MG PO TB24: 60.0000 mg | ORAL_TABLET | Freq: Every day | ORAL | 2 refills | Status: DC

## 2019-01-08 NOTE — Telephone Encounter (Signed)
°*  STAT* If patient is at the pharmacy, call can be transferred to refill team.   1. Which medications need to be refilled? (please list name of each medication and dose if known) isosorbide mononitrate (IMDUR) 60 MG 24 hr tablet nitroGLYCERIN (NITROSTAT) 0.4 MG SL tablet  2. Which pharmacy/location (including street and city if local pharmacy) is medication to be sent to? [facility] - [place], [place] - [address]  3. Do they need a 30 day or 90 day supply? 90 day

## 2019-01-08 NOTE — Telephone Encounter (Signed)
S/w Arlene Weatherford(wife) (DPR) appointment scheduled Rx sent to requested pharmacy

## 2019-01-25 DIAGNOSIS — Z8673 Personal history of transient ischemic attack (TIA), and cerebral infarction without residual deficits: Secondary | ICD-10-CM | POA: Diagnosis not present

## 2019-01-25 DIAGNOSIS — M109 Gout, unspecified: Secondary | ICD-10-CM | POA: Diagnosis not present

## 2019-01-25 DIAGNOSIS — I25118 Atherosclerotic heart disease of native coronary artery with other forms of angina pectoris: Secondary | ICD-10-CM | POA: Diagnosis not present

## 2019-01-25 DIAGNOSIS — Z0000 Encounter for general adult medical examination without abnormal findings: Secondary | ICD-10-CM | POA: Diagnosis not present

## 2019-01-25 DIAGNOSIS — G894 Chronic pain syndrome: Secondary | ICD-10-CM | POA: Diagnosis not present

## 2019-01-25 DIAGNOSIS — Z23 Encounter for immunization: Secondary | ICD-10-CM | POA: Diagnosis not present

## 2019-01-25 DIAGNOSIS — K219 Gastro-esophageal reflux disease without esophagitis: Secondary | ICD-10-CM | POA: Diagnosis not present

## 2019-01-25 DIAGNOSIS — I10 Essential (primary) hypertension: Secondary | ICD-10-CM | POA: Diagnosis not present

## 2019-01-25 DIAGNOSIS — Z87898 Personal history of other specified conditions: Secondary | ICD-10-CM | POA: Diagnosis not present

## 2019-01-25 DIAGNOSIS — E785 Hyperlipidemia, unspecified: Secondary | ICD-10-CM | POA: Diagnosis not present

## 2019-02-06 DIAGNOSIS — I2510 Atherosclerotic heart disease of native coronary artery without angina pectoris: Secondary | ICD-10-CM | POA: Diagnosis not present

## 2019-02-06 DIAGNOSIS — E785 Hyperlipidemia, unspecified: Secondary | ICD-10-CM | POA: Diagnosis not present

## 2019-02-06 DIAGNOSIS — I25118 Atherosclerotic heart disease of native coronary artery with other forms of angina pectoris: Secondary | ICD-10-CM | POA: Diagnosis not present

## 2019-02-06 DIAGNOSIS — I25119 Atherosclerotic heart disease of native coronary artery with unspecified angina pectoris: Secondary | ICD-10-CM | POA: Diagnosis not present

## 2019-02-06 DIAGNOSIS — I10 Essential (primary) hypertension: Secondary | ICD-10-CM | POA: Diagnosis not present

## 2019-02-06 DIAGNOSIS — M170 Bilateral primary osteoarthritis of knee: Secondary | ICD-10-CM | POA: Diagnosis not present

## 2019-03-08 DIAGNOSIS — M170 Bilateral primary osteoarthritis of knee: Secondary | ICD-10-CM | POA: Diagnosis not present

## 2019-03-08 DIAGNOSIS — E785 Hyperlipidemia, unspecified: Secondary | ICD-10-CM | POA: Diagnosis not present

## 2019-03-08 DIAGNOSIS — I25119 Atherosclerotic heart disease of native coronary artery with unspecified angina pectoris: Secondary | ICD-10-CM | POA: Diagnosis not present

## 2019-03-08 DIAGNOSIS — I25118 Atherosclerotic heart disease of native coronary artery with other forms of angina pectoris: Secondary | ICD-10-CM | POA: Diagnosis not present

## 2019-03-08 DIAGNOSIS — I10 Essential (primary) hypertension: Secondary | ICD-10-CM | POA: Diagnosis not present

## 2019-03-08 DIAGNOSIS — I2510 Atherosclerotic heart disease of native coronary artery without angina pectoris: Secondary | ICD-10-CM | POA: Diagnosis not present

## 2019-03-14 NOTE — Progress Notes (Signed)
Virtual Visit via Telephone Note   This visit type was conducted due to national recommendations for restrictions regarding the COVID-19 Pandemic (e.g. social distancing) in an effort to limit this patient's exposure and mitigate transmission in our community.  Due to his co-morbid illnesses, this patient is at least at moderate risk for complications without adequate follow up.  This format is felt to be most appropriate for this patient at this time.  The patient did not have access to video technology/had technical difficulties with video requiring transitioning to audio format only (telephone).  All issues noted in this document were discussed and addressed.  No physical exam could be performed with this format.  Please refer to the patient's chart for his  consent to telehealth for [facility].   Date:  03/19/2019   ID:  Jake Dunn, Jake Dunn 16, 1942, MRN [id number]  Patient Location: Home Provider Location: Home  PCP:  Jake Stains, MD  Cardiologist:  Jake Morten Martinique, MD  Electrophysiologist:  None   Evaluation Performed:  Follow-Up Visit  Chief Complaint:  HTN  History of Present Illness:    Jake Dunn is a 78 y.o. male with a  PMH of CAD  (70% 15rd diag '14), GERD, HTN, HL, renal cell CA s/p left nephrectomy. Last cardiac catheterization in 2014 showed nonobstructive CAD with 70% lesion in third diagonal that was treated medically. He is fairly active and recently undergoing physical therapy after his right shoulder surgery 5 months ago. He presented with left sided chest pressure on 08/15/2016 while at Urology office. BP was quite high that day. He underwent cardiac catheterization on 08/16/2016, this revealed two-vessel CAD with moderate calcified stenosis in the mid LAD, second diagonal, ramus intermedius, ostial and proximal left circumflex and a severe stenosis of the second PLA branch of the RCA. Overall normal LVEDP and normal LVEF. Recommended  medical therapy and  outpatient exercise stress Myoview. While the LAD and the diagonal only showed moderate progression compared to the 2010/2014 studies, the left circumflex and intermediate stenosis had progressed. Echocardiogram obtained on 08/17/2016 showed EF 0000000, grade 2 diastolic dysfunction, severely dilated left atrium, PA peak pressure 30 mmHg. Lexiscan Myoview obtained on 08/23/2016 showed EF 51%, no perfusion defect at stress, overall low risk study.   After this his Imdur was increased and amlodipine was added. Afterwards  patient states he could not function. He felt very fatigued and lightheaded. He felt drunk all the time.  In the past he has had problems with aggressive BP control with orthostatic hypotension and syncope. His amlodipine was discontinued due to low BP and symptoms. He states he also reduced his metoprolol to once a day. Afterwards he felt much better. Fatigue and lightheadedness have resolved. BP at home has been excellent. He does have a lot of neck pain radiating to his shoulders and into his chest. He had an MRI  showing significant cervical disease. He is followed by Dr. Ellene Route. He did undergo lumbar surgery in August 2019.   Patient reports problems with his breathing for the past several weeks. No energy. It is worse at night. Last night had to get up into chair to breath. Doesn't feel well. He has some chest pain. He notes some of this is indigestion. He did take 2 sl Ntg last week and this helped. With this he broke out in a sweat and got sick to his stomach. BP is labile but consistently high. He does note some swelling in his legs.  The patient does not have symptoms concerning for COVID-19 infection (fever, chills, cough, or new shortness of breath).    Past Medical History:  Diagnosis Date   Adenomatous polyp    Anxiety    Arthritis    "knees, ankles, shoulders" (08/15/2016)   CAD (coronary artery disease)    s/p cath in 2014 showing significant stenosis in the  diagonal side branches and in the RV marginal branch. These vessels were small and diffusely diseased. He is managed medically. 08/16/16 Cath, no disease progression   Chronic lower back pain    Disc disease, degenerative, cervical    GERD (gastroesophageal reflux disease)    Gout    Hyperlipidemia    Hypertension    Osteoarthritis    Renal cell carcinoma    left kidney removal   Stroke [facility])    MINI STROKE 15+ YRS AGO, no residual   Stroke ([place])    "I've had 2 or 3"; denies residual on 08/15/2016   Syncope and collapse    Past Surgical History:  Procedure Laterality Date   ANTERIOR CERVICAL DECOMP/DISCECTOMY FUSION  02/2004; 04/2005   [name] 09/20/2010; [name] [id number]   APPLICATION OF ROBOTIC ASSISTANCE FOR SPINAL PROCEDURE N/A 01/02/2018   Procedure: APPLICATION OF ROBOTIC ASSISTANCE FOR SPINAL PROCEDURE;  Surgeon: Kristeen Miss, MD;  Location: [place];  Service: Neurosurgery;  Laterality: N/A;   BACK SURGERY     CARDIAC CATHETERIZATION  12/14/2008   ef 50-55%. SHOWED SIGNIFICANT STENOSIS AND TO DIAGONAL SIDE BRANCHES AND IN THE RIGHT VENTRICULAR MARGINAL BRANCH. THESE VESSELS WERE SMALL AND DIFFUSELY DISEASED   CARDIOVASCULAR STRESS TEST  12/10/2009   EF 61%. EKG negative. Mild peri ischemia. Managed medically.    CARPAL TUNNEL RELEASE Right 11/2005   [name] 09/20/2010   CARPAL TUNNEL RELEASE Left 06/2007   [name] 5/132012   COLONOSCOPY  11/2004   [name] 09/20/2010   COLONOSCOPY W/ BIOPSIES AND POLYPECTOMY  09/2002   [name] 09/20/2010   INCISION AND DRAINAGE ABSCESS Right 10/18/2016   Procedure: INCISION AND DRAINAGE ABSCESS RIGHT SHOULDER;  Surgeon: [name], MD;  Location: [place];  Service: Orthopedics;  Laterality: Right;   INCISION AND DRAINAGE OF WOUND Right 08/2005   shoulder/notes 09/20/2010   IR FLUORO GUIDE CV LINE LEFT  10/19/2016   IR US GUIDE VASC ACCESS LEFT  10/19/2016   IRRIGATION AND DEBRIDEMENT SHOULDER Right 12/22/2016     Procedure: IRRIGATION AND DEBRIDEMENT RIGHT SHOULDER;  Surgeon: [name], MD;  Location: [place];  Service: Orthopedics;  Laterality: Right;   JOINT REPLACEMENT     LEFT HEART CATH AND CORONARY ANGIOGRAPHY N/A 08/16/2016   Procedure: Left Heart Cath and Coronary Angiography;  Surgeon: [name], MD;  Location: [place] [facility];  Service: Cardiovascular;  Laterality: N/A;   LEFT HEART CATHETERIZATION WITH CORONARY ANGIOGRAM N/A 08/09/2012   Procedure: LEFT HEART CATHETERIZATION WITH CORONARY ANGIOGRAM;  Surgeon: Matalynn Graff M Martinique, MD;  Location: [facility] CATH LAB;  Service: Cardiovascular;  Laterality: N/A;   LUMBAR FUSION     NEPHRECTOMY Left early 2000s   REPLACEMENT TOTAL KNEE Right 08/2008   [name] 09/07/2010   SHOULDER ARTHROSCOPY WITH ROTATOR CUFF REPAIR Right 06/2005   [name] 09/20/2010   TOTAL KNEE ARTHROPLASTY  06/08/2011   Procedure: TOTAL KNEE ARTHROPLASTY;  Surgeon: [name], MD;  Location: [place];  Service: Orthopedics;  Laterality: Left;   TOTAL SHOULDER ARTHROPLASTY Right 04/06/2016   Procedure: RIGHT REVERSE TOTAL SHOULDER ARTHROPLASTY;  Surgeon: Nestor Ramp  Percell Miller, MD;  Location: [place];  Service: Orthopedics;  Laterality: Right;   TOTAL SHOULDER REVISION Right 12/22/2016   Procedure: RIGHT SHOULDER GLENOID AND HUMERAL COMPONENT REVISION;  Surgeon: [name], MD;  Location: [place];  Service: Orthopedics;  Laterality: Right;   US ECHOCARDIOGRAPHY  12/15/2008   EF 60-65%   US ECHOCARDIOGRAPHY  09/28/2004   EF 55-60%     Current Meds  Medication Sig   acyclovir (ZOVIRAX) 400 MG tablet Take 400 mg by mouth 3 (three) times daily as needed. For cold sores.   allopurinol (ZYLOPRIM) 300 MG tablet Take 300 mg by mouth daily as needed (for gout flares).    aspirin EC 81 MG EC tablet Take 1 tablet (81 mg total) by mouth daily. (Patient taking differently: Take 81 mg by mouth every evening. )   atorvastatin (LIPITOR) 80 MG tablet Take 1 tablet (80 mg total) by  mouth every evening.   clopidogrel (PLAVIX) 75 MG tablet Take 75 mg by mouth every evening.    colchicine 0.6 MG tablet Take 0.6 mg by mouth daily as needed (for gout flares.).   diazepam (VALIUM) 5 MG tablet Take 1 tablet (5 mg total) by mouth every 6 (six) hours as needed for muscle spasms.   EPINEPHrine 0.3 mg/0.3 mL IJ SOAJ injection USE AS DIRECTED IF LIFE THREATENING REACTION OCCURS   HYDROcodone-acetaminophen (NORCO/VICODIN) 5-325 MG tablet Take 1 tablet by mouth every 6 (six) hours as needed for pain.   isosorbide mononitrate (IMDUR) 60 MG 24 hr tablet Take 1 tablet (60 mg total) by mouth daily.   LORazepam (ATIVAN) 0.5 MG tablet Take 0.5 mg by mouth 2 (two) times daily as needed for anxiety.    losartan (COZAAR) 25 MG tablet Take 25 mg by mouth daily.   metoprolol tartrate (LOPRESSOR) 25 MG tablet Take 25 mg by mouth 2 (two) times daily.   nitroGLYCERIN (NITROSTAT) 0.4 MG SL tablet Place 1 tablet (0.4 mg total) under the tongue every 5 (five) minutes x 3 doses as needed for chest pain.   oxyCODONE-acetaminophen (PERCOCET/ROXICET) 5-325 MG tablet Take 1-2 tablets by mouth every 3 (three) hours as needed for severe pain.   pantoprazole (PROTONIX) 40 MG tablet Take 40 mg by mouth every evening.    [DISCONTINUED] olmesartan (BENICAR) 20 MG tablet Take 20 mg by mouth every evening.      Allergies:   Adhesive [tape] and Latex   Social History   Tobacco Use   Smoking status: Former Smoker    Quit date: 05/31/1989    Years since quitting: 29.8   Smokeless tobacco: Never Used   Tobacco comment: 08/15/2016 "hadn't smoked a carton of cigarettes all my life"  Substance Use Topics   Alcohol use: Yes    Alcohol/week: 0.0 standard drinks    Comment: "sometimes daily, sometimes weekly" beer   Drug use: No     Family Hx: The patient's family history includes Heart attack in his father; Heart disease in his brother. There is no history of Allergic rhinitis, Angioedema, Asthma,  Atopy, Eczema, Immunodeficiency, or Urticaria.  ROS:   Please see the history of present illness.    All other systems reviewed and are negative.   Prior CV studies:   The following studies were reviewed today:  Cath 08/16/2016 Conclusion   1. 3 vessel CAD with moderate calcified stenoses of the mid-LAD/second diagonal, ramus intermedius, ostial and proximal circumflex, and severe stenosis of the second PLA branch of the RCA 2. Normal LV  systolic function and normal LVEDP  Recommend: Aggressive medical therapy. Outpatient exercise stress myoview. Consider TCTS evaluation for CABG if Myoview high-risk. Otherwise continue with medical therapy.  While the LAD/diag only show modest progression compared to 2010 and 2014 cath studies, the left circumflex/intermediate stenoses have progressed.    Diagnostic Diagram         Echo 08/17/2016 LV EF: 55% - 60%  - Left ventricle: The cavity size was normal. There was mild concentric hypertrophy. Systolic function was normal. The estimated ejection fraction was in the range of 55% to 60%. Wall motion was normal; there were no regional wall motion abnormalities. Features are consistent with a pseudonormal left ventricular filling pattern, with concomitant abnormal relaxation and increased filling pressure (grade 2 diastolic dysfunction). Doppler parameters are consistent with indeterminate ventricular filling pressure. - Aortic valve: Transvalvular velocity was within the normal range. There was no stenosis. There was trivial regurgitation. - Mitral valve: Transvalvular velocity was within the normal range. There was no evidence for stenosis. There was trivial regurgitation. - Left atrium: The atrium was severely dilated. - Right ventricle: The cavity size was normal. Wall thickness was normal. Systolic function was normal. - Atrial septum: No defect or patent foramen ovale was identified. -  Tricuspid valve: There was trivial regurgitation. - Pulmonary arteries: Systolic pressure was within the normal range. PA peak pressure: 30 mm Hg (S).    Myoview 08/23/2016 Study Highlights     Nuclear stress EF: 51%. Mildly a synchronous contraction.  There was no ST segment deviation noted during stress. No perfusion defects at stress.  This is a low risk study. No evidence of ischemia identified.       Labs/Other Tests and Data Reviewed:    EKG:  No ECG reviewed.  Recent Labs: No results found for requested labs within last 8760 hours.   Recent Lipid Panel Lab Results  Component Value Date/Time   CHOL 148 08/16/2016 05:04 AM   TRIG 138 08/16/2016 05:04 AM   HDL 36 (L) 08/16/2016 05:04 AM   CHOLHDL 4.1 08/16/2016 05:04 AM   LDLCALC 84 08/16/2016 05:04 AM   Dated 01/25/19: cholesterol 117, triglycerides 125, HDL 39, LDL 53. CMET, CBC, TSH normal  Wt Readings from Last 3 Encounters:  03/19/19 228 lb (103.4 kg)  01/02/18 222 lb (100.7 kg)  01/01/18 222 lb 6.4 oz (100.9 kg)     Objective:    Vital Signs:  BP (!) 170/89    Pulse 69    Ht 5\' 7"  (1.702 m)    Wt 228 lb (103.4 kg)    BMI 35.71 kg/m    VITAL SIGNS:  reviewed  ASSESSMENT & PLAN:    1. CAD:   He does have moderate 3 vessel CAD by cath in 2018 but no critical disease. Subsequent Myoview was low risk so medical therapy was recommended. He now has symptoms concerning for progressive angina versus CHF. Will continue current  medical management. restrict salt intake. Will initiate lasix 20 mg daily. Will check CXR and make appointment for him to be seen in office with Ecg. May need to consider repeat cardiac cath.   2. HTN: Blood pressure is poorly controlled on multiple meds. Will monitor response to lasix. Suspect he is volume overloaded based on symptoms.  Still have to balance this with symptoms of orthostatic dizziness and syncope.   3. HLD: Continue on Lipitor 80 mg daily. Excellent control.    4.  History of CVA on plavix.   5.  Cervical/lumbar spine disease. Followed by Dr. Ellene Route.   6. Right shoulder prosthetic infection/abscess. S/p surgical revision. Infection resolved.    COVID-19 Education: The signs and symptoms of COVID-19 were discussed with the patient and how to seek care for testing (follow up with PCP or arrange E-visit).  The importance of social distancing was discussed today.  Time:   Today, I have spent 20 minutes with the patient with telehealth technology discussing the above problems.     Medication Adjustments/Labs and Tests Ordered: Current medicines are reviewed at length with the patient today.  Concerns regarding medicines are outlined above.   Tests Ordered: No orders of the defined types were placed in this encounter.   Medication Changes: No orders of the defined types were placed in this encounter.   Follow Up:  See above  Signed, Dywane Peruski Martinique, MD  03/19/2019 8:14 AM    [facility]

## 2019-03-19 ENCOUNTER — Encounter: Payer: Self-pay | Admitting: Cardiology

## 2019-03-19 ENCOUNTER — Telehealth (INDEPENDENT_AMBULATORY_CARE_PROVIDER_SITE_OTHER): Payer: Medicare HMO | Admitting: Cardiology

## 2019-03-19 ENCOUNTER — Other Ambulatory Visit: Payer: Self-pay

## 2019-03-19 ENCOUNTER — Ambulatory Visit
Admission: RE | Admit: 2019-03-19 | Discharge: 2019-03-19 | Disposition: A | Discharge status: Home / Self Care | Payer: Medicare HMO | Source: Ambulatory Visit | Attending: Cardiology | Admitting: Cardiology

## 2019-03-19 ENCOUNTER — Ambulatory Visit: Payer: Medicare HMO | Admitting: Cardiology

## 2019-03-19 VITALS — BP 170/89 | HR 69 | Ht 67.0 in | Wt 228.0 lb

## 2019-03-19 DIAGNOSIS — I25118 Atherosclerotic heart disease of native coronary artery with other forms of angina pectoris: Secondary | ICD-10-CM

## 2019-03-19 DIAGNOSIS — R0602 Shortness of breath: Secondary | ICD-10-CM

## 2019-03-19 DIAGNOSIS — I10 Essential (primary) hypertension: Secondary | ICD-10-CM

## 2019-03-19 DIAGNOSIS — E7800 Pure hypercholesterolemia, unspecified: Secondary | ICD-10-CM

## 2019-03-19 MED ORDER — FUROSEMIDE 20 MG PO TABS: 20.0000 mg | ORAL_TABLET | Freq: Every day | ORAL | 3 refills | Status: DC

## 2019-03-19 NOTE — Addendum Note (Signed)
Addended by: Kathyrn Lass on: 03/19/2019 08:38 AM   Modules accepted: Orders

## 2019-03-19 NOTE — Patient Instructions (Signed)
Start lasix 20 mg daily.   We will check a chest Xray  We will get you a visit in the office to be seen

## 2019-03-20 ENCOUNTER — Ambulatory Visit: Payer: Medicare HMO | Admitting: Cardiology

## 2019-03-20 ENCOUNTER — Other Ambulatory Visit: Payer: Self-pay | Admitting: Cardiology

## 2019-03-20 ENCOUNTER — Encounter: Payer: Self-pay | Admitting: Cardiology

## 2019-03-20 ENCOUNTER — Other Ambulatory Visit: Payer: Self-pay

## 2019-03-20 VITALS — BP 158/91 | HR 74 | Ht 67.0 in | Wt 234.2 lb

## 2019-03-20 DIAGNOSIS — I200 Unstable angina: Secondary | ICD-10-CM

## 2019-03-20 DIAGNOSIS — I10 Essential (primary) hypertension: Secondary | ICD-10-CM

## 2019-03-20 DIAGNOSIS — I5031 Acute diastolic (congestive) heart failure: Secondary | ICD-10-CM

## 2019-03-20 DIAGNOSIS — I25110 Atherosclerotic heart disease of native coronary artery with unstable angina pectoris: Secondary | ICD-10-CM

## 2019-03-20 DIAGNOSIS — I2510 Atherosclerotic heart disease of native coronary artery without angina pectoris: Secondary | ICD-10-CM | POA: Diagnosis not present

## 2019-03-20 LAB — BASIC METABOLIC PANEL
BUN/Creatinine Ratio: 11 (ref 10–24)
BUN: 11 mg/dL (ref 8–27)
CO2: 20 mmol/L (ref 20–29)
Calcium: 9.4 mg/dL (ref 8.6–10.2)
Chloride: 106 mmol/L (ref 96–106)
Creatinine, Ser: 1.01 mg/dL (ref 0.76–1.27)
GFR calc Af Amer: 83 mL/min/{1.73_m2} (ref >59)
GFR calc non Af Amer: 72 mL/min/{1.73_m2} (ref >59)
Glucose: 94 mg/dL (ref 65–99)
Potassium: 4.2 mmol/L (ref 3.5–5.2)
Sodium: 140 mmol/L (ref 134–144)

## 2019-03-20 LAB — CBC WITH DIFFERENTIAL/PLATELET
Basophils Absolute: 0.1 10*3/uL (ref 0.0–0.2)
Basos: 1 %
EOS (ABSOLUTE): 0.2 10*3/uL (ref 0.0–0.4)
Eos: 3 %
Hematocrit: 41.4 % (ref 37.5–51.0)
Hemoglobin: 14.1 g/dL (ref 13.0–17.7)
Immature Grans (Abs): 0 10*3/uL (ref 0.0–0.1)
Immature Granulocytes: 0 %
Lymphocytes Absolute: 1.1 10*3/uL (ref 0.7–3.1)
Lymphs: 15 %
MCH: 31.5 pg (ref 26.6–33.0)
MCHC: 34.1 g/dL (ref 31.5–35.7)
MCV: 93 fL (ref 79–97)
Monocytes Absolute: 0.7 10*3/uL (ref 0.1–0.9)
Monocytes: 10 %
Neutrophils Absolute: 5.4 10*3/uL (ref 1.4–7.0)
Neutrophils: 71 %
Platelets: 261 10*3/uL (ref 150–450)
RBC: 4.47 x10E6/uL (ref 4.14–5.80)
RDW: 13.1 % (ref 11.6–15.4)
WBC: 7.5 10*3/uL (ref 3.4–10.8)

## 2019-03-20 LAB — PT AND PTT
INR: 0.9 (ref 0.9–1.2)
Prothrombin Time: 10.2 s (ref 9.1–12.0)
aPTT: 27 s (ref 24–33)

## 2019-03-20 MED ORDER — SODIUM CHLORIDE 0.9% FLUSH: 3.0000 mL | Freq: Two times a day (BID) | INTRAVENOUS | Status: DC

## 2019-03-20 NOTE — Addendum Note (Signed)
Addended by: Kathyrn Lass on: 03/20/2019 08:56 AM   Modules accepted: Orders

## 2019-03-20 NOTE — Patient Instructions (Addendum)
Increase losartan to 50 mg daily  Stop Plavix  Go ahead and take 2 lasix tablets today then once a day  We will schedule you for a cardiac cath.  Follow instructions below        [place] [address] Dept: [phone] Loc: [place]  03/20/2019  You are scheduled for a Cardiac Cath Tuesday 03/26/19 with Dr.Valaria Kohut.  1. Please arrive at the [facility] (Main Entrance A) at [facility]: [address] [place], [place] [address] at 5:30 am (This time is two hours before your procedure to ensure your preparation). Free valet parking service is available.   Special note: Every effort is made to have your procedure done on time. Please understand that emergencies sometimes delay scheduled procedures.  2. Diet: Do not eat solid foods after midnight.  The patient may have clear liquids until 5am upon the day of the procedure.  3. Labs: You will need to have blood drawn on Wed 03/20/19 at [facility] do not need to be fasting.  You will need to have Covid Test Friday 03/22/19 at 10:10 am at [facility] at [place].After Covid test you will need to quarantine until after Cath.  4. Medication instructions in preparation for your procedure:      Hold Furosemide morning of Cath     On the morning of your procedure, take your Aspirin 81 mg and any morning medicines NOT listed above.  You may use sips of water.  5. Plan for one night stay--bring personal belongings. 6. Bring a current list of your medications and current insurance cards. 7. You MUST have a responsible person to drive you home. 8. Someone MUST be with you the first 24 hours after you arrive home or your discharge will be delayed. 9. Please wear clothes that are easy to get on and off and wear slip-on shoes.  Thank you for allowing  [place] to care for you!   -- [facility]

## 2019-03-20 NOTE — H&P (View-Only) (Signed)
Cardiology Office Note   Date:  03/20/2019   ID:  Jake Dunn, Jake Dunn 1940-07-13, MRN [id number]  PCP:  Harlan Stains, MD  Cardiologist:   Gaetan Spieker Martinique, MD   Chief Complaint  Patient presents with   Chest Pain   Shortness of Breath      History of Present Illness: Jake Dunn is a 78 y.o. male who presents for evaluation of worsening SOB and chest pain. Was seen by televisit yesterday with these symptoms and we were concerned enough to recommend an in person visit. Jake Dunn is a 78 y.o. male with a PMH of CAD  (70% 63rd diag '14), GERD, HTN, HL, renal cell CA s/p left nephrectomy. Last cardiac catheterization in 2014 showed nonobstructive CAD with 70% lesion in third diagonal that was treated medically. He is fairly active and recently undergoing physical therapy after his right shoulder surgery 5 months ago. He presented with left sided chest pressure on 08/15/2016 while at Urology office. BP was quite high that day. He underwent cardiac catheterization on 08/16/2016, this revealed two-vessel CAD with moderate calcified stenosis in the mid LAD, second diagonal, ramus intermedius, ostial and proximal left circumflex and a severe stenosis of the second PLA branch of the RCA. Overall normal LVEDP and normal LVEF. Recommended medical therapy and outpatient exercise stress Myoview. While the LAD and the diagonal only showed moderate progression compared to the 2010/2014 studies, the left circumflex and intermediate stenosis had progressed. Echocardiogram obtained on 08/17/2016 showed EF 0000000, grade 2 diastolic dysfunction, severely dilated left atrium, PA peak pressure 30 mmHg. Lexiscan Myoview obtained on 08/23/2016 showed EF 51%, no perfusion defect at stress, overall low risk study.   After this his Imdur was increased and amlodipine was added. Afterwards patient states he could not function. He felt very fatigued and lightheaded. He felt drunk all the time. In the past he has had  problems with aggressive BP control with orthostatic hypotension and syncope. His amlodipine was discontinued due to low BP and symptoms. He states he also reduced his metoprolol to once a day. Afterwards he felt much better. Fatigue and lightheadedness have resolved. BP at home has been excellent. He does have a lot of neck pain radiating to his shoulders and into his chest. He had an MRI showing significant cervical disease. He is followed by Dr. Ellene Route. He did undergo lumbar surgery in August 2019.   Patient reports problems with his breathing for the past several weeks. No energy. It is worse at night. Last night had to get up into chair to breath. Doesn't feel well. He has some chest pain. He notes some of this feels like indigestion. He did take 2 sl Ntg last week and this helped. With this he broke out in a sweat and got sick to his stomach. BP is labile but consistently high. He does note some swelling in his legs.     Past Medical History:  Diagnosis Date   Adenomatous polyp    Anxiety    Arthritis    "knees, ankles, shoulders" (08/15/2016)   CAD (coronary artery disease)    s/p cath in 2014 showing significant stenosis in the diagonal side branches and in the RV marginal branch. These vessels were small and diffusely diseased. He is managed medically. 08/16/16 Cath, no disease progression   Chronic lower back pain    Disc disease, degenerative, cervical    GERD (gastroesophageal reflux disease)    Gout    Hyperlipidemia  Hypertension    Osteoarthritis    Renal cell carcinoma    left kidney removal   Stroke [facility])    MINI STROKE 15+ YRS AGO, no residual   Stroke ([place])    "I've had 2 or 3"; denies residual on 08/15/2016   Syncope and collapse     Past Surgical History:  Procedure Laterality Date   ANTERIOR CERVICAL DECOMP/DISCECTOMY FUSION  02/2004; 04/2005   [name] 09/20/2010; [name] [id number]   APPLICATION OF ROBOTIC ASSISTANCE FOR SPINAL PROCEDURE N/A  01/02/2018   Procedure: APPLICATION OF ROBOTIC ASSISTANCE FOR SPINAL PROCEDURE;  Surgeon: Kristeen Miss, MD;  Location: [place];  Service: Neurosurgery;  Laterality: N/A;   BACK SURGERY     CARDIAC CATHETERIZATION  12/14/2008   ef 50-55%. SHOWED SIGNIFICANT STENOSIS AND TO DIAGONAL SIDE BRANCHES AND IN THE RIGHT VENTRICULAR MARGINAL BRANCH. THESE VESSELS WERE SMALL AND DIFFUSELY DISEASED   CARDIOVASCULAR STRESS TEST  12/10/2009   EF 61%. EKG negative. Mild peri ischemia. Managed medically.    CARPAL TUNNEL RELEASE Right 11/2005   [name] 09/20/2010   CARPAL TUNNEL RELEASE Left 06/2007   [name] 5/132012   COLONOSCOPY  11/2004   [name] 09/20/2010   COLONOSCOPY W/ BIOPSIES AND POLYPECTOMY  09/2002   [name] 09/20/2010   INCISION AND DRAINAGE ABSCESS Right 10/18/2016   Procedure: INCISION AND DRAINAGE ABSCESS RIGHT SHOULDER;  Surgeon: [name], MD;  Location: [facility];  Service: Orthopedics;  Laterality: Right;   INCISION AND DRAINAGE OF WOUND Right 08/2005   shoulder/notes 09/20/2010   IR FLUORO GUIDE CV LINE LEFT  10/19/2016   IR US GUIDE VASC ACCESS LEFT  10/19/2016   IRRIGATION AND DEBRIDEMENT SHOULDER Right 12/22/2016   Procedure: IRRIGATION AND DEBRIDEMENT RIGHT SHOULDER;  Surgeon: [name], MD;  Location: [place];  Service: Orthopedics;  Laterality: Right;   JOINT REPLACEMENT     LEFT HEART CATH AND CORONARY ANGIOGRAPHY N/A 08/16/2016   Procedure: Left Heart Cath and Coronary Angiography;  Surgeon: [name], MD;  Location: [facility] CV [facility];  Service: Cardiovascular;  Laterality: N/A;   LEFT HEART CATHETERIZATION WITH CORONARY ANGIOGRAM N/A 08/09/2012   Procedure: LEFT HEART CATHETERIZATION WITH CORONARY ANGIOGRAM;  Surgeon: Jakita Dutkiewicz M Martinique, MD;  Location[facility] CATH [facility];  Service: Cardiovascular;  Laterality: N/A;   LUMBAR FUSION     NEPHRECTOMY Left early 2000s   REPLACEMENT TOTAL KNEE Right 08/2008   [name] 09/07/2010   SHOULDER ARTHROSCOPY WITH  ROTATOR CUFF REPAIR Right 06/2005   [name] 09/20/2010   TOTAL KNEE ARTHROPLASTY  06/08/2011   Procedure: TOTAL KNEE ARTHROPLASTY;  Surgeon: [name], MD;  Location: [place];  Service: Orthopedics;  Laterality: Left;   TOTAL SHOULDER ARTHROPLASTY Right 04/06/2016   Procedure: RIGHT REVERSE TOTAL SHOULDER ARTHROPLASTY;  Surgeon: [name], MD;  Location: [place];  Service: Orthopedics;  Laterality: Right;   TOTAL SHOULDER REVISION Right 12/22/2016   Procedure: RIGHT SHOULDER GLENOID AND HUMERAL COMPONENT REVISION;  Surgeon: [name], MD;  Location: [place];  Service: Orthopedics;  Laterality: Right;   US ECHOCARDIOGRAPHY  12/15/2008   EF 60-65%   US ECHOCARDIOGRAPHY  09/28/2004   EF 55-60%     Current Outpatient Medications  Medication Sig Dispense Refill   acyclovir (ZOVIRAX) 400 MG tablet Take 400 mg by mouth 3 (three) times daily as needed. For cold sores.  5   allopurinol (ZYLOPRIM) 300 MG tablet Take 300 mg by mouth daily as needed (for gout flares).  aspirin EC 81 MG EC tablet Take 1 tablet (81 mg total) by mouth daily. (Patient taking differently: Take 81 mg by mouth every evening. )     atorvastatin (LIPITOR) 80 MG tablet Take 1 tablet (80 mg total) by mouth every evening. 30 tablet 6   clopidogrel (PLAVIX) 75 MG tablet Take 75 mg by mouth every evening.      colchicine 0.6 MG tablet Take 0.6 mg by mouth daily as needed (for gout flares.).     EPINEPHrine 0.3 mg/0.3 mL IJ SOAJ injection USE AS DIRECTED IF LIFE THREATENING REACTION OCCURS  11   furosemide (LASIX) 20 MG tablet Take 1 tablet (20 mg total) by mouth daily. 90 tablet 3   HYDROcodone-acetaminophen (NORCO/VICODIN) 5-325 MG tablet Take 1 tablet by mouth every 6 (six) hours as needed for pain.  0   isosorbide mononitrate (IMDUR) 60 MG 24 hr tablet Take 1 tablet (60 mg total) by mouth daily. 90 tablet 2   LORazepam (ATIVAN) 0.5 MG tablet Take 0.5 mg by mouth 2 (two) times daily as needed for  anxiety.   2   losartan (COZAAR) 25 MG tablet Take 25 mg by mouth daily.     metoprolol tartrate (LOPRESSOR) 25 MG tablet Take 25 mg by mouth 2 (two) times daily.     nitroGLYCERIN (NITROSTAT) 0.4 MG SL tablet Place 1 tablet (0.4 mg total) under the tongue every 5 (five) minutes x 3 doses as needed for chest pain. 25 tablet 2   pantoprazole (PROTONIX) 40 MG tablet Take 40 mg by mouth every evening.      No current facility-administered medications for this visit.     Allergies:   Adhesive [tape] and Latex    Social History:  The patient  reports that he quit smoking about 29 years ago. He has never used smokeless tobacco. He reports current alcohol use. He reports that he does not use drugs.   Family History:  The patient's family history includes Heart attack in his father; Heart disease in his brother.    ROS:  Please see the history of present illness.   Otherwise, review of systems are positive for none.   All other systems are reviewed and negative.    PHYSICAL EXAM: VS:  BP (!) 158/91    Pulse 74    Ht 5\' 7"  (1.702 m)    Wt 234 lb 3.2 oz (106.2 kg)    SpO2 98%    BMI 36.68 kg/m  , BMI Body mass index is 36.68 kg/m. GEN: Well nourished, well developed, in no acute distress  HEENT: normal  Neck: no JVD, carotid bruits, or masses Cardiac: RRR; no murmurs, rubs, or gallops,no edema  Respiratory:  clear to auscultation bilaterally, normal work of breathing GI: soft, nontender, nondistended, + BS MS: no deformity or atrophy  Skin: warm and dry, no rash Neuro:  Strength and sensation are intact Psych: euthymic mood, full affect   EKG:  EKG is ordered today. The ekg ordered today demonstrates NSR with normal Ecg. Rate 66. I have personally reviewed and interpreted this study.    Recent Labs: No results found for requested labs within last 8760 hours.    Lipid Panel    Component Value Date/Time   CHOL 148 08/16/2016 0504   TRIG 138 08/16/2016 0504   HDL 36 (L)  08/16/2016 0504   CHOLHDL 4.1 08/16/2016 0504   VLDL 28 08/16/2016 0504   LDLCALC 84 08/16/2016 0504    Dated 01/25/19: cholesterol  117, triglycerides 125, HDL 39, LDL 53. CMET, CBC, TSH normal  Wt Readings from Last 3 Encounters:  03/20/19 234 lb 3.2 oz (106.2 kg)  03/19/19 228 lb (103.4 kg)  01/02/18 222 lb (100.7 kg)      Other studies Reviewed: Additional studies/ records that were reviewed today include:   Cath 08/16/2016 Conclusion   1. 3 vessel CAD with moderate calcified stenoses of the mid-LAD/second diagonal, ramus intermedius, ostial and proximal circumflex, and severe stenosis of the second PLA branch of the RCA 2. Normal LV systolic function and normal LVEDP  Recommend: Aggressive medical therapy. Outpatient exercise stress myoview. Consider TCTS evaluation for CABG if Myoview high-risk. Otherwise continue with medical therapy.  While the LAD/diag only show modest progression compared to 2010 and 2014 cath studies, the left circumflex/intermediate stenoses have progressed.    Diagnostic Diagram         Echo 08/17/2016 LV EF: 55% - 60%  - Left ventricle: The cavity size was normal. There was mild concentric hypertrophy. Systolic function was normal. The estimated ejection fraction was in the range of 55% to 60%. Wall motion was normal; there were no regional wall motion abnormalities. Features are consistent with a pseudonormal left ventricular filling pattern, with concomitant abnormal relaxation and increased filling pressure (grade 2 diastolic dysfunction). Doppler parameters are consistent with indeterminate ventricular filling pressure. - Aortic valve: Transvalvular velocity was within the normal range. There was no stenosis. There was trivial regurgitation. - Mitral valve: Transvalvular velocity was within the normal range. There was no evidence for stenosis. There was trivial regurgitation. - Left atrium: The atrium  was severely dilated. - Right ventricle: The cavity size was normal. Wall thickness was normal. Systolic function was normal. - Atrial septum: No defect or patent foramen ovale was identified. - Tricuspid valve: There was trivial regurgitation. - Pulmonary arteries: Systolic pressure was within the normal range. PA peak pressure: 30 mm Hg (S).    Myoview 08/23/2016 Study Highlights     Nuclear stress EF: 51%. Mildly a synchronous contraction.  There was no ST segment deviation noted during stress. No perfusion defects at stress.  This is a low risk study. No evidence of ischemia identified.     CHEST - 2 VIEW  COMPARISON:  06/21/2017  FINDINGS: Cardiomediastinal silhouette unchanged in size and contour. No evidence of central vascular congestion. No pneumothorax or pleural effusion. No confluent airspace disease.  Coarsened interstitial markings.  Surgical changes of the cervical region bilateral shoulders in the upper abdomen.  No displaced fracture with degenerative changes of the spine  IMPRESSION: Chronic lung changes, without evidence for acute cardiopulmonary disease.   Electronically Signed   By: Corrie Mckusick D.O.   On: 03/19/2019 09:11   ASSESSMENT AND PLAN:  1.CAD:  He does have moderate 3 vessel CAD by cath in 2018 Subsequent Lexiscan Myoview was low risk so medical therapy was recommended. He now has symptoms concerning for progressive angina and/or CHF. Will continue current  medical management.Restrict salt intake. Will initiate lasix 40 mg today then 20 daily. I reviewed his cardiac cath from 2018. He really has a lot of CAD with multivessel disease. He is not a candidate for PCI and I think with progressive symptoms he will need to be considered for CABG. I recommend he undergo cardiac cath and will schedule this for next Tuesday. Will stop Plavix for now in anticipation for CABG. The procedure and risks were reviewed including  but not limited to death, myocardial infarction, stroke,  arrythmias, bleeding, transfusion, emergency surgery, dye allergy, or renal dysfunction. The patient voices understanding and is agreeable to proceed.   2. HTN: Blood pressure is poorly controlled on multiple meds. Will monitor response to lasix. increase losartan to 50 mg daily. Monitor for symptoms of orthostasis.   3. HLD: Continue on Lipitor 80 mg daily.Excellent control.   4. History of CVA on ASA and Plavix. For the near term will hold Plavix since CABG is anticipated.  5. Cervical/lumbar spine disease. Followed by Dr. Ellene Route.   6. Right shoulder prosthetic infection/abscess. S/p surgical revision.Infection resolved  7. Diastolic CHF acute. CXR shows very mild interstitial changes. Initiate diuretic therapy.   Current medicines are reviewed at length with the patient today.  The patient does not have concerns regarding medicines.  The following changes have been made:  See above  Labs/ tests ordered today include:   Orders Placed This Encounter  Procedures   Basic metabolic panel   CBC w/Diff/Platelet   PT and PTT   EKG 12-Lead     Disposition:   FU TBD  Signed, Majesty Oehlert Martinique, MD  03/20/2019 8:28 AM    [facility] [address], [place], [place], [address] Phone [phone], Fax [phone]

## 2019-03-20 NOTE — Progress Notes (Signed)
Cardiology Office Note   Date:  03/20/2019   ID:  Jake Dunn 1940/09/19, MRN [id number]  PCP:  Harlan Stains, MD  Cardiologist:   Seddrick Flax Martinique, MD   Chief Complaint  Patient presents with   Chest Pain   Shortness of Breath      History of Present Illness: Jake Dunn is a 78 y.o. male who presents for evaluation of worsening SOB and chest pain. Was seen by televisit yesterday with these symptoms and we were concerned enough to recommend an in person visit. Jake Dunn is a 78 y.o. male with a PMH of CAD  (70% 67rd diag '14), GERD, HTN, HL, renal cell CA s/p left nephrectomy. Last cardiac catheterization in 2014 showed nonobstructive CAD with 70% lesion in third diagonal that was treated medically. He is fairly active and recently undergoing physical therapy after his right shoulder surgery 5 months ago. He presented with left sided chest pressure on 08/15/2016 while at Urology office. BP was quite high that day. He underwent cardiac catheterization on 08/16/2016, this revealed two-vessel CAD with moderate calcified stenosis in the mid LAD, second diagonal, ramus intermedius, ostial and proximal left circumflex and a severe stenosis of the second PLA branch of the RCA. Overall normal LVEDP and normal LVEF. Recommended medical therapy and outpatient exercise stress Myoview. While the LAD and the diagonal only showed moderate progression compared to the 2010/2014 studies, the left circumflex and intermediate stenosis had progressed. Echocardiogram obtained on 08/17/2016 showed EF 0000000, grade 2 diastolic dysfunction, severely dilated left atrium, PA peak pressure 30 mmHg. Lexiscan Myoview obtained on 08/23/2016 showed EF 51%, no perfusion defect at stress, overall low risk study.   After this his Imdur was increased and amlodipine was added. Afterwards patient states he could not function. He felt very fatigued and lightheaded. He felt drunk all the time. In the past he has had  problems with aggressive BP control with orthostatic hypotension and syncope. His amlodipine was discontinued due to low BP and symptoms. He states he also reduced his metoprolol to once a day. Afterwards he felt much better. Fatigue and lightheadedness have resolved. BP at home has been excellent. He does have a lot of neck pain radiating to his shoulders and into his chest. He had an MRI showing significant cervical disease. He is followed by Dr. Ellene Route. He did undergo lumbar surgery in August 2019.   Patient reports problems with his breathing for the past several weeks. No energy. It is worse at night. Last night had to get up into chair to breath. Doesn't feel well. He has some chest pain. He notes some of this feels like indigestion. He did take 2 sl Ntg last week and this helped. With this he broke out in a sweat and got sick to his stomach. BP is labile but consistently high. He does note some swelling in his legs.     Past Medical History:  Diagnosis Date   Adenomatous polyp    Anxiety    Arthritis    "knees, ankles, shoulders" (08/15/2016)   CAD (coronary artery disease)    s/p cath in 2014 showing significant stenosis in the diagonal side branches and in the RV marginal branch. These vessels were small and diffusely diseased. He is managed medically. 08/16/16 Cath, no disease progression   Chronic lower back pain    Disc disease, degenerative, cervical    GERD (gastroesophageal reflux disease)    Gout    Hyperlipidemia  Hypertension    Osteoarthritis    Renal cell carcinoma    left kidney removal   Stroke [facility])    MINI STROKE 15+ YRS AGO, no residual   Stroke ([place])    "I've had 2 or 3"; denies residual on 08/15/2016   Syncope and collapse     Past Surgical History:  Procedure Laterality Date   ANTERIOR CERVICAL DECOMP/DISCECTOMY FUSION  02/2004; 04/2005   [name] 09/20/2010; [name] [id number]   APPLICATION OF ROBOTIC ASSISTANCE FOR SPINAL PROCEDURE N/A  01/02/2018   Procedure: APPLICATION OF ROBOTIC ASSISTANCE FOR SPINAL PROCEDURE;  Surgeon: Kristeen Miss, MD;  Location: [place];  Service: Neurosurgery;  Laterality: N/A;   BACK SURGERY     CARDIAC CATHETERIZATION  12/14/2008   ef 50-55%. SHOWED SIGNIFICANT STENOSIS AND TO DIAGONAL SIDE BRANCHES AND IN THE RIGHT VENTRICULAR MARGINAL BRANCH. THESE VESSELS WERE SMALL AND DIFFUSELY DISEASED   CARDIOVASCULAR STRESS TEST  12/10/2009   EF 61%. EKG negative. Mild peri ischemia. Managed medically.    CARPAL TUNNEL RELEASE Right 11/2005   [name] 09/20/2010   CARPAL TUNNEL RELEASE Left 06/2007   [name] 5/132012   COLONOSCOPY  11/2004   [name] 09/20/2010   COLONOSCOPY W/ BIOPSIES AND POLYPECTOMY  09/2002   [name] 09/20/2010   INCISION AND DRAINAGE ABSCESS Right 10/18/2016   Procedure: INCISION AND DRAINAGE ABSCESS RIGHT SHOULDER;  Surgeon: [name], MD;  Location: [place];  Service: Orthopedics;  Laterality: Right;   INCISION AND DRAINAGE OF WOUND Right 08/2005   shoulder/notes 09/20/2010   IR FLUORO GUIDE CV LINE LEFT  10/19/2016   IR US GUIDE VASC ACCESS LEFT  10/19/2016   IRRIGATION AND DEBRIDEMENT SHOULDER Right 12/22/2016   Procedure: IRRIGATION AND DEBRIDEMENT RIGHT SHOULDER;  Surgeon: [name], MD;  Location: [place];  Service: Orthopedics;  Laterality: Right;   JOINT REPLACEMENT     LEFT HEART CATH AND CORONARY ANGIOGRAPHY N/A 08/16/2016   Procedure: Left Heart Cath and Coronary Angiography;  Surgeon: [name], MD;  Location: [place] CV LAB;  Service: Cardiovascular;  Laterality: N/A;   LEFT HEART CATHETERIZATION WITH CORONARY ANGIOGRAM N/A 08/09/2012   Procedure: LEFT HEART CATHETERIZATION WITH CORONARY ANGIOGRAM;  Surgeon: Eual Lindstrom M Martinique, MD;  Location: [facility] CATH [facility];  Service: Cardiovascular;  Laterality: N/A;   LUMBAR FUSION     NEPHRECTOMY Left early 2000s   REPLACEMENT TOTAL KNEE Right 08/2008   [name] 09/07/2010   SHOULDER ARTHROSCOPY WITH  ROTATOR CUFF REPAIR Right 06/2005   [name] 09/20/2010   TOTAL KNEE ARTHROPLASTY  06/08/2011   Procedure: TOTAL KNEE ARTHROPLASTY;  Surgeon: [name], MD;  Location: [place];  Service: Orthopedics;  Laterality: Left;   TOTAL SHOULDER ARTHROPLASTY Right 04/06/2016   Procedure: RIGHT REVERSE TOTAL SHOULDER ARTHROPLASTY;  Surgeon: [name], MD;  Location: [place];  Service: Orthopedics;  Laterality: Right;   TOTAL SHOULDER REVISION Right 12/22/2016   Procedure: RIGHT SHOULDER GLENOID AND HUMERAL COMPONENT REVISION;  Surgeon: [name], MD;  Location: [place];  Service: Orthopedics;  Laterality: Right;   US ECHOCARDIOGRAPHY  12/15/2008   EF 60-65%   US ECHOCARDIOGRAPHY  09/28/2004   EF 55-60%     Current Outpatient Medications  Medication Sig Dispense Refill   acyclovir (ZOVIRAX) 400 MG tablet Take 400 mg by mouth 3 (three) times daily as needed. For cold sores.  5   allopurinol (ZYLOPRIM) 300 MG tablet Take 300 mg by mouth daily as needed (for gout flares).  aspirin EC 81 MG EC tablet Take 1 tablet (81 mg total) by mouth daily. (Patient taking differently: Take 81 mg by mouth every evening. )     atorvastatin (LIPITOR) 80 MG tablet Take 1 tablet (80 mg total) by mouth every evening. 30 tablet 6   clopidogrel (PLAVIX) 75 MG tablet Take 75 mg by mouth every evening.      colchicine 0.6 MG tablet Take 0.6 mg by mouth daily as needed (for gout flares.).     EPINEPHrine 0.3 mg/0.3 mL IJ SOAJ injection USE AS DIRECTED IF LIFE THREATENING REACTION OCCURS  11   furosemide (LASIX) 20 MG tablet Take 1 tablet (20 mg total) by mouth daily. 90 tablet 3   HYDROcodone-acetaminophen (NORCO/VICODIN) 5-325 MG tablet Take 1 tablet by mouth every 6 (six) hours as needed for pain.  0   isosorbide mononitrate (IMDUR) 60 MG 24 hr tablet Take 1 tablet (60 mg total) by mouth daily. 90 tablet 2   LORazepam (ATIVAN) 0.5 MG tablet Take 0.5 mg by mouth 2 (two) times daily as needed for  anxiety.   2   losartan (COZAAR) 25 MG tablet Take 25 mg by mouth daily.     metoprolol tartrate (LOPRESSOR) 25 MG tablet Take 25 mg by mouth 2 (two) times daily.     nitroGLYCERIN (NITROSTAT) 0.4 MG SL tablet Place 1 tablet (0.4 mg total) under the tongue every 5 (five) minutes x 3 doses as needed for chest pain. 25 tablet 2   pantoprazole (PROTONIX) 40 MG tablet Take 40 mg by mouth every evening.      No current facility-administered medications for this visit.     Allergies:   Adhesive [tape] and Latex    Social History:  The patient  reports that he quit smoking about 29 years ago. He has never used smokeless tobacco. He reports current alcohol use. He reports that he does not use drugs.   Family History:  The patient's family history includes Heart attack in his father; Heart disease in his brother.    ROS:  Please see the history of present illness.   Otherwise, review of systems are positive for none.   All other systems are reviewed and negative.    PHYSICAL EXAM: VS:  BP (!) 158/91    Pulse 74    Ht 5\' 7"  (1.702 m)    Wt 234 lb 3.2 oz (106.2 kg)    SpO2 98%    BMI 36.68 kg/m  , BMI Body mass index is 36.68 kg/m. GEN: Well nourished, well developed, in no acute distress  HEENT: normal  Neck: no JVD, carotid bruits, or masses Cardiac: RRR; no murmurs, rubs, or gallops,no edema  Respiratory:  clear to auscultation bilaterally, normal work of breathing GI: soft, nontender, nondistended, + BS MS: no deformity or atrophy  Skin: warm and dry, no rash Neuro:  Strength and sensation are intact Psych: euthymic mood, full affect   EKG:  EKG is ordered today. The ekg ordered today demonstrates NSR with normal Ecg. Rate 66. I have personally reviewed and interpreted this study.    Recent Labs: No results found for requested labs within last 8760 hours.    Lipid Panel    Component Value Date/Time   CHOL 148 08/16/2016 0504   TRIG 138 08/16/2016 0504   HDL 36 (L)  08/16/2016 0504   CHOLHDL 4.1 08/16/2016 0504   VLDL 28 08/16/2016 0504   LDLCALC 84 08/16/2016 0504    Dated 01/25/19: cholesterol  117, triglycerides 125, HDL 39, LDL 53. CMET, CBC, TSH normal  Wt Readings from Last 3 Encounters:  03/20/19 234 lb 3.2 oz (106.2 kg)  03/19/19 228 lb (103.4 kg)  01/02/18 222 lb (100.7 kg)      Other studies Reviewed: Additional studies/ records that were reviewed today include:   Cath 08/16/2016 Conclusion   1. 3 vessel CAD with moderate calcified stenoses of the mid-LAD/second diagonal, ramus intermedius, ostial and proximal circumflex, and severe stenosis of the second PLA branch of the RCA 2. Normal LV systolic function and normal LVEDP  Recommend: Aggressive medical therapy. Outpatient exercise stress myoview. Consider TCTS evaluation for CABG if Myoview high-risk. Otherwise continue with medical therapy.  While the LAD/diag only show modest progression compared to 2010 and 2014 cath studies, the left circumflex/intermediate stenoses have progressed.    Diagnostic Diagram         Echo 08/17/2016 LV EF: 55% - 60%  - Left ventricle: The cavity size was normal. There was mild concentric hypertrophy. Systolic function was normal. The estimated ejection fraction was in the range of 55% to 60%. Wall motion was normal; there were no regional wall motion abnormalities. Features are consistent with a pseudonormal left ventricular filling pattern, with concomitant abnormal relaxation and increased filling pressure (grade 2 diastolic dysfunction). Doppler parameters are consistent with indeterminate ventricular filling pressure. - Aortic valve: Transvalvular velocity was within the normal range. There was no stenosis. There was trivial regurgitation. - Mitral valve: Transvalvular velocity was within the normal range. There was no evidence for stenosis. There was trivial regurgitation. - Left atrium: The atrium  was severely dilated. - Right ventricle: The cavity size was normal. Wall thickness was normal. Systolic function was normal. - Atrial septum: No defect or patent foramen ovale was identified. - Tricuspid valve: There was trivial regurgitation. - Pulmonary arteries: Systolic pressure was within the normal range. PA peak pressure: 30 mm Hg (S).    Myoview 08/23/2016 Study Highlights     Nuclear stress EF: 51%. Mildly a synchronous contraction.  There was no ST segment deviation noted during stress. No perfusion defects at stress.  This is a low risk study. No evidence of ischemia identified.     CHEST - 2 VIEW  COMPARISON:  06/21/2017  FINDINGS: Cardiomediastinal silhouette unchanged in size and contour. No evidence of central vascular congestion. No pneumothorax or pleural effusion. No confluent airspace disease.  Coarsened interstitial markings.  Surgical changes of the cervical region bilateral shoulders in the upper abdomen.  No displaced fracture with degenerative changes of the spine  IMPRESSION: Chronic lung changes, without evidence for acute cardiopulmonary disease.   Electronically Signed   By: Corrie Mckusick D.O.   On: 03/19/2019 09:11   ASSESSMENT AND PLAN:  1.CAD:  He does have moderate 3 vessel CAD by cath in 2018 Subsequent Lexiscan Myoview was low risk so medical therapy was recommended. He now has symptoms concerning for progressive angina and/or CHF. Will continue current  medical management.Restrict salt intake. Will initiate lasix 40 mg today then 20 daily. I reviewed his cardiac cath from 2018. He really has a lot of CAD with multivessel disease. He is not a candidate for PCI and I think with progressive symptoms he will need to be considered for CABG. I recommend he undergo cardiac cath and will schedule this for next Tuesday. Will stop Plavix for now in anticipation for CABG. The procedure and risks were reviewed including  but not limited to death, myocardial infarction, stroke,  arrythmias, bleeding, transfusion, emergency surgery, dye allergy, or renal dysfunction. The patient voices understanding and is agreeable to proceed.   2. HTN: Blood pressure is poorly controlled on multiple meds. Will monitor response to lasix. increase losartan to 50 mg daily. Monitor for symptoms of orthostasis.   3. HLD: Continue on Lipitor 80 mg daily.Excellent control.   4. History of CVA on ASA and Plavix. For the near term will hold Plavix since CABG is anticipated.  5. Cervical/lumbar spine disease. Followed by Dr. Ellene Route.   6. Right shoulder prosthetic infection/abscess. S/p surgical revision.Infection resolved  7. Diastolic CHF acute. CXR shows very mild interstitial changes. Initiate diuretic therapy.   Current medicines are reviewed at length with the patient today.  The patient does not have concerns regarding medicines.  The following changes have been made:  See above  Labs/ tests ordered today include:   Orders Placed This Encounter  Procedures   Basic metabolic panel   CBC w/Diff/Platelet   PT and PTT   EKG 12-Lead     Disposition:   FU TBD  Signed, Kelsha Older Martinique, MD  03/20/2019 8:28 AM    [facility] [address] [place], [place], [address] Phone [phone], Fax [phone]

## 2019-03-22 ENCOUNTER — Other Ambulatory Visit (HOSPITAL_COMMUNITY)
Admission: RE | Admit: 2019-03-22 | Discharge: 2019-03-22 | Disposition: A | Discharge status: Home / Self Care | Payer: Medicare HMO | Source: Ambulatory Visit | Attending: Cardiology | Admitting: Cardiology

## 2019-03-22 DIAGNOSIS — Z01812 Encounter for preprocedural laboratory examination: Secondary | ICD-10-CM | POA: Diagnosis not present

## 2019-03-22 DIAGNOSIS — Z20828 Contact with and (suspected) exposure to other viral communicable diseases: Secondary | ICD-10-CM | POA: Insufficient documentation

## 2019-03-24 LAB — NOVEL CORONAVIRUS, NAA (HOSP ORDER, SEND-OUT TO REF LAB; TAT 18-24 HRS): SARS-CoV-2, NAA: NOT DETECTED

## 2019-03-25 ENCOUNTER — Telehealth: Payer: Self-pay | Admitting: *Deleted

## 2019-03-25 NOTE — Telephone Encounter (Signed)
Pt contacted pre-catheterization scheduled at [facility] for: Tuesday November 17,2020 7:30 AM Verified arrival time and place: [facility]) at: 5:30 AM  No solid food after midnight prior to cath, clear liquids until 5 AM day of procedure. Contrast allergy: no  Hold: Lasix-AM of procedure.  Except hold medications AM meds can be  taken pre-cath with sip of water including: ASA 81 mg  Copied from Dr Doug Sou 03/20/19 office note: Will stop Plavix for now in anticipation for CABG.   Confirmed patient has responsible adult to drive home post procedure and observe 24 hours after arriving home: yes  Currently, due to Covid-19 pandemic, only one support person will be allowed with patient. Must be the same support person for that patient's entire stay, will be screened and required to wear a mask. They will be asked to wait in the waiting room for the duration of the patient's stay.  Patients are required to wear a mask when they enter the hospital.      COVID-19 Pre-Screening Questions:  . In the past 7 to 10 days have you had a cough,  shortness of breath, headache, congestion, fever (100 or greater) body aches, chills, sore throat, or sudden loss of taste or sense of smell? no . Have you been around anyone with known Covid 19? no . Have you been around anyone who is awaiting Covid 19 test results in the past 7 to 10 days? no . Have you been around anyone who has been exposed to Covid 19, or has mentioned symptoms of Covid 19 within the past 7 to 10 days? no   I reviewed procedure/mask/visitor instructions, Covid-19 screening questions with patient's wife(DPR), she verbalized understanding, thanked me for call.

## 2019-03-26 ENCOUNTER — Encounter (HOSPITAL_COMMUNITY): Payer: Self-pay | Admitting: Cardiology

## 2019-03-26 ENCOUNTER — Ambulatory Visit (HOSPITAL_BASED_OUTPATIENT_CLINIC_OR_DEPARTMENT_OTHER)
Admission: RE | Admit: 2019-03-26 | Discharge: 2019-03-26 | Disposition: A | Discharge status: Home / Self Care | Payer: Medicare HMO | Source: Home / Self Care | Attending: Cardiology | Admitting: Cardiology

## 2019-03-26 ENCOUNTER — Encounter (HOSPITAL_COMMUNITY)
Admission: RE | Disposition: A | Discharge status: Home / Self Care | Payer: Self-pay | Source: Home / Self Care | Attending: Cardiology

## 2019-03-26 ENCOUNTER — Other Ambulatory Visit: Payer: Self-pay

## 2019-03-26 DIAGNOSIS — E785 Hyperlipidemia, unspecified: Secondary | ICD-10-CM | POA: Insufficient documentation

## 2019-03-26 DIAGNOSIS — I110 Hypertensive heart disease with heart failure: Secondary | ICD-10-CM | POA: Insufficient documentation

## 2019-03-26 DIAGNOSIS — I25110 Atherosclerotic heart disease of native coronary artery with unstable angina pectoris: Secondary | ICD-10-CM | POA: Insufficient documentation

## 2019-03-26 DIAGNOSIS — R0600 Dyspnea, unspecified: Secondary | ICD-10-CM | POA: Diagnosis present

## 2019-03-26 DIAGNOSIS — Z905 Acquired absence of kidney: Secondary | ICD-10-CM | POA: Insufficient documentation

## 2019-03-26 DIAGNOSIS — M109 Gout, unspecified: Secondary | ICD-10-CM | POA: Insufficient documentation

## 2019-03-26 DIAGNOSIS — I2584 Coronary atherosclerosis due to calcified coronary lesion: Secondary | ICD-10-CM | POA: Insufficient documentation

## 2019-03-26 DIAGNOSIS — I200 Unstable angina: Secondary | ICD-10-CM

## 2019-03-26 DIAGNOSIS — M1990 Unspecified osteoarthritis, unspecified site: Secondary | ICD-10-CM | POA: Insufficient documentation

## 2019-03-26 DIAGNOSIS — Z7982 Long term (current) use of aspirin: Secondary | ICD-10-CM | POA: Insufficient documentation

## 2019-03-26 DIAGNOSIS — Z8249 Family history of ischemic heart disease and other diseases of the circulatory system: Secondary | ICD-10-CM | POA: Insufficient documentation

## 2019-03-26 DIAGNOSIS — Z91040 Latex allergy status: Secondary | ICD-10-CM | POA: Insufficient documentation

## 2019-03-26 DIAGNOSIS — Z79899 Other long term (current) drug therapy: Secondary | ICD-10-CM | POA: Insufficient documentation

## 2019-03-26 DIAGNOSIS — Z7902 Long term (current) use of antithrombotics/antiplatelets: Secondary | ICD-10-CM | POA: Insufficient documentation

## 2019-03-26 DIAGNOSIS — I5031 Acute diastolic (congestive) heart failure: Secondary | ICD-10-CM | POA: Insufficient documentation

## 2019-03-26 DIAGNOSIS — Z87891 Personal history of nicotine dependence: Secondary | ICD-10-CM | POA: Insufficient documentation

## 2019-03-26 DIAGNOSIS — Z8673 Personal history of transient ischemic attack (TIA), and cerebral infarction without residual deficits: Secondary | ICD-10-CM | POA: Insufficient documentation

## 2019-03-26 DIAGNOSIS — I10 Essential (primary) hypertension: Secondary | ICD-10-CM | POA: Diagnosis present

## 2019-03-26 DIAGNOSIS — K219 Gastro-esophageal reflux disease without esophagitis: Secondary | ICD-10-CM | POA: Insufficient documentation

## 2019-03-26 HISTORY — PX: LEFT HEART CATH AND CORONARY ANGIOGRAPHY: CATH118249

## 2019-03-26 SURGERY — LEFT HEART CATH AND CORONARY ANGIOGRAPHY
Anesthesia: LOCAL

## 2019-03-26 MED ORDER — IOHEXOL 350 MG/ML SOLN
INTRAVENOUS | Status: DC | PRN
  Administered 2019-03-26: 100 mL via INTRACARDIAC

## 2019-03-26 MED ORDER — SODIUM CHLORIDE 0.9 % WEIGHT BASED INFUSION
3.0000 mL/kg/h | INTRAVENOUS | Status: AC
  Administered 2019-03-26: 3 mL/kg/h via INTRAVENOUS

## 2019-03-26 MED ORDER — ACETAMINOPHEN 325 MG PO TABS: 650.0000 mg | ORAL_TABLET | ORAL | Status: DC | PRN

## 2019-03-26 MED ORDER — LIDOCAINE HCL (PF) 1 % IJ SOLN
INTRAMUSCULAR | Status: AC
  Filled 2019-03-26: qty 30

## 2019-03-26 MED ORDER — ASPIRIN 81 MG PO CHEW: 81.0000 mg | CHEWABLE_TABLET | ORAL | Status: DC

## 2019-03-26 MED ORDER — CLOPIDOGREL BISULFATE 75 MG PO TABS: 75.0000 mg | ORAL_TABLET | Freq: Once | ORAL | Status: DC

## 2019-03-26 MED ORDER — HYDRALAZINE HCL 20 MG/ML IJ SOLN: 10.0000 mg | INTRAMUSCULAR | Status: DC | PRN

## 2019-03-26 MED ORDER — FENTANYL CITRATE (PF) 100 MCG/2ML IJ SOLN
INTRAMUSCULAR | Status: AC
  Filled 2019-03-26: qty 2

## 2019-03-26 MED ORDER — MIDAZOLAM HCL 2 MG/2ML IJ SOLN
INTRAMUSCULAR | Status: DC | PRN
  Administered 2019-03-26: 1 mg via INTRAVENOUS

## 2019-03-26 MED ORDER — HEPARIN (PORCINE) IN NACL 1000-0.9 UT/500ML-% IV SOLN
INTRAVENOUS | Status: DC | PRN
  Administered 2019-03-26: 500 mL

## 2019-03-26 MED ORDER — SODIUM CHLORIDE 0.9% FLUSH: 3.0000 mL | INTRAVENOUS | Status: DC | PRN

## 2019-03-26 MED ORDER — FENTANYL CITRATE (PF) 100 MCG/2ML IJ SOLN
INTRAMUSCULAR | Status: DC | PRN
  Administered 2019-03-26: 25 ug via INTRAVENOUS

## 2019-03-26 MED ORDER — ONDANSETRON HCL 4 MG/2ML IJ SOLN: 4.0000 mg | Freq: Four times a day (QID) | INTRAMUSCULAR | Status: DC | PRN

## 2019-03-26 MED ORDER — MIDAZOLAM HCL 2 MG/2ML IJ SOLN
INTRAMUSCULAR | Status: AC
  Filled 2019-03-26: qty 2

## 2019-03-26 MED ORDER — SODIUM CHLORIDE 0.9 % IV SOLN: 250.0000 mL | INTRAVENOUS | Status: DC | PRN

## 2019-03-26 MED ORDER — LABETALOL HCL 5 MG/ML IV SOLN: 10.0000 mg | INTRAVENOUS | Status: DC | PRN

## 2019-03-26 MED ORDER — SODIUM CHLORIDE 0.9% FLUSH: 3.0000 mL | Freq: Two times a day (BID) | INTRAVENOUS | Status: DC

## 2019-03-26 MED ORDER — LIDOCAINE HCL (PF) 1 % IJ SOLN
INTRAMUSCULAR | Status: DC | PRN
  Administered 2019-03-26: 2 mL via INTRADERMAL

## 2019-03-26 MED ORDER — HEPARIN SODIUM (PORCINE) 1000 UNIT/ML IJ SOLN
INTRAMUSCULAR | Status: DC | PRN
  Administered 2019-03-26: 5000 [IU] via INTRAVENOUS

## 2019-03-26 MED ORDER — HEPARIN SODIUM (PORCINE) 1000 UNIT/ML IJ SOLN
INTRAMUSCULAR | Status: AC
  Filled 2019-03-26: qty 1

## 2019-03-26 MED ORDER — VERAPAMIL HCL 2.5 MG/ML IV SOLN
INTRAVENOUS | Status: DC | PRN
  Administered 2019-03-26: 10 mL via INTRA_ARTERIAL

## 2019-03-26 MED ORDER — SODIUM CHLORIDE 0.9 % WEIGHT BASED INFUSION: 1.0000 mL/kg/h | INTRAVENOUS | Status: AC

## 2019-03-26 MED ORDER — VERAPAMIL HCL 2.5 MG/ML IV SOLN
INTRAVENOUS | Status: AC
  Filled 2019-03-26: qty 2

## 2019-03-26 MED ORDER — HEPARIN (PORCINE) IN NACL 1000-0.9 UT/500ML-% IV SOLN
INTRAVENOUS | Status: AC
  Filled 2019-03-26: qty 1000

## 2019-03-26 MED ORDER — SODIUM CHLORIDE 0.9 % WEIGHT BASED INFUSION: 1.0000 mL/kg/h | INTRAVENOUS | Status: DC

## 2019-03-26 SURGICAL SUPPLY — 10 items
CATH 5FR JL3.5 JR4 ANG PIG MP (CATHETERS) ×2 IMPLANT
DEVICE RAD COMP TR BAND LRG (VASCULAR PRODUCTS) ×2 IMPLANT
GLIDESHEATH SLEND SS 6F .021 (SHEATH) ×2 IMPLANT
GUIDEWIRE INQWIRE 1.5J.035X260 (WIRE) IMPLANT
INQWIRE 1.5J .035X260CM (WIRE) ×2
KIT HEART LEFT (KITS) ×2 IMPLANT
PACK CARDIAC CATHETERIZATION (CUSTOM PROCEDURE TRAY) ×2 IMPLANT
SYR MEDRAD MARK 7 150ML (SYRINGE) ×2 IMPLANT
TRANSDUCER W/STOPCOCK (MISCELLANEOUS) ×2 IMPLANT
TUBING CIL FLEX 10 FLL-RA (TUBING) ×2 IMPLANT

## 2019-03-26 NOTE — Progress Notes (Signed)
TCTS consulted for outpatient CABG evaluation. TCTS office to contact the pt with an appt.

## 2019-03-26 NOTE — Interval H&P Note (Signed)
History and Physical Interval Note:  03/26/2019 7:09 AM  Jake Dunn  has presented today for surgery, with the diagnosis of cad.  The various methods of treatment have been discussed with the patient and family. After consideration of risks, benefits and other options for treatment, the patient has consented to  Procedure(s): LEFT HEART CATH AND CORONARY ANGIOGRAPHY (N/A) as a surgical intervention.  The patient's history has been reviewed, patient examined, no change in status, stable for surgery.  I have reviewed the patient's chart and labs.  Questions were answered to the patient's satisfaction.   Cath Lab Visit (complete for each Cath Lab visit)  Clinical Evaluation Leading to the Procedure:   ACS: Yes.    Non-ACS:    Anginal Classification: CCS III  Anti-ischemic medical therapy: Maximal Therapy (2 or more classes of medications)  Non-Invasive Test Results: No non-invasive testing performed  Prior CABG: No previous CABG        Peter Martinique MD,FACC 03/26/2019 7:09 AM

## 2019-03-26 NOTE — Discharge Instructions (Signed)
Radial Site Care ° °This sheet gives you information about how to care for yourself after your procedure. Your health care provider may also give you more specific instructions. If you have problems or questions, contact your health care provider. °What can I expect after the procedure? °After the procedure, it is common to have: °· Bruising and tenderness at the catheter insertion area. °Follow these instructions at home: °Medicines °· Take over-the-counter and prescription medicines only as told by your health care provider. °Insertion site care °· Follow instructions from your health care provider about how to take care of your insertion site. Make sure you: °? Wash your hands with soap and water before you change your bandage (dressing). If soap and water are not available, use hand sanitizer. °? Change your dressing as told by your health care provider. °? Leave stitches (sutures), skin glue, or adhesive strips in place. These skin closures may need to stay in place for 2 weeks or longer. If adhesive strip edges start to loosen and curl up, you may trim the loose edges. Do not remove adhesive strips completely unless your health care provider tells you to do that. °· Check your insertion site every day for signs of infection. Check for: °? Redness, swelling, or pain. °? Fluid or blood. °? Pus or a bad smell. °? Warmth. °· Do not take baths, swim, or use a hot tub until your health care provider approves. °· You may shower 24-48 hours after the procedure, or as directed by your health care provider. °? Remove the dressing and gently wash the site with plain soap and water. °? Pat the area dry with a clean towel. °? Do not rub the site. That could cause bleeding. °· Do not apply powder or lotion to the site. °Activity ° °· For 24 hours after the procedure, or as directed by your health care provider: °? Do not flex or bend the affected arm. °? Do not push or pull heavy objects with the affected arm. °? Do not  drive yourself home from the hospital or clinic. You may drive 24 hours after the procedure unless your health care provider tells you not to. °? Do not operate machinery or power tools. °· Do not lift anything that is heavier than 10 lb (4.5 kg), or the limit that you are told, until your health care provider says that it is safe. °· Ask your health care provider when it is okay to: °? Return to work or school. °? Resume usual physical activities or sports. °? Resume sexual activity. °General instructions °· If the catheter site starts to bleed, raise your arm and put firm pressure on the site. If the bleeding does not stop, get help right away. This is a medical emergency. °· If you went home on the same day as your procedure, a responsible adult should be with you for the first 24 hours after you arrive home. °· Keep all follow-up visits as told by your health care provider. This is important. °Contact a health care provider if: °· You have a fever. °· You have redness, swelling, or yellow drainage around your insertion site. °Get help right away if: °· You have unusual pain at the radial site. °· The catheter insertion area swells very fast. °· The insertion area is bleeding, and the bleeding does not stop when you hold steady pressure on the area. °· Your arm or hand becomes pale, cool, tingly, or numb. °These symptoms may represent a serious problem   that is an emergency. Do not wait to see if the symptoms will go away. Get medical help right away. Call your local emergency services (911 in the [place] Do not drive yourself to the hospital. Summary  After the procedure, it is common to have bruising and tenderness at the site.  Follow instructions from your health care provider about how to take care of your radial site wound. Check the wound every day for signs of infection.  Do not lift anything that is heavier than 10 lb (4.5 kg), or the limit that you are told, until your health care provider says  that it is safe. This information is not intended to replace advice given to you by your health care provider. Make sure you discuss any questions you have with your health care provider. Document Released: 05/28/2010 Document Revised: 05/31/2017 Document Reviewed: 05/31/2017 Elsevier Patient Education  2020 [employer]. Stop taking Plavix

## 2019-03-29 ENCOUNTER — Encounter: Payer: Self-pay | Admitting: Thoracic Surgery (Cardiothoracic Vascular Surgery)

## 2019-03-29 ENCOUNTER — Other Ambulatory Visit: Payer: Self-pay | Admitting: Thoracic Surgery (Cardiothoracic Vascular Surgery)

## 2019-03-29 ENCOUNTER — Institutional Professional Consult (permissible substitution): Payer: Medicare HMO | Admitting: Thoracic Surgery (Cardiothoracic Vascular Surgery)

## 2019-03-29 ENCOUNTER — Other Ambulatory Visit: Payer: Self-pay

## 2019-03-29 ENCOUNTER — Emergency Department (HOSPITAL_COMMUNITY): Payer: Medicare HMO

## 2019-03-29 ENCOUNTER — Inpatient Hospital Stay (HOSPITAL_COMMUNITY)
Admission: EM | Admit: 2019-03-29 | Discharge: 2019-04-08 | DRG: 234 | Disposition: A | Discharge status: Home Health | Payer: Medicare HMO | Attending: Thoracic Surgery (Cardiothoracic Vascular Surgery) | Admitting: Thoracic Surgery (Cardiothoracic Vascular Surgery)

## 2019-03-29 ENCOUNTER — Encounter (HOSPITAL_COMMUNITY): Payer: Self-pay | Admitting: Emergency Medicine

## 2019-03-29 ENCOUNTER — Inpatient Hospital Stay (HOSPITAL_COMMUNITY): Payer: Medicare HMO

## 2019-03-29 VITALS — BP 137/80 | HR 67 | Temp 97.8°F | Resp 20 | Ht 67.0 in | Wt 230.0 lb

## 2019-03-29 DIAGNOSIS — M5090 Cervical disc disorder, unspecified, unspecified cervical region: Secondary | ICD-10-CM | POA: Diagnosis present

## 2019-03-29 DIAGNOSIS — Z01810 Encounter for preprocedural cardiovascular examination: Secondary | ICD-10-CM | POA: Diagnosis not present

## 2019-03-29 DIAGNOSIS — Z91048 Other nonmedicinal substance allergy status: Secondary | ICD-10-CM

## 2019-03-29 DIAGNOSIS — G8929 Other chronic pain: Secondary | ICD-10-CM | POA: Diagnosis present

## 2019-03-29 DIAGNOSIS — I129 Hypertensive chronic kidney disease with stage 1 through stage 4 chronic kidney disease, or unspecified chronic kidney disease: Secondary | ICD-10-CM | POA: Diagnosis present

## 2019-03-29 DIAGNOSIS — D696 Thrombocytopenia, unspecified: Secondary | ICD-10-CM | POA: Diagnosis not present

## 2019-03-29 DIAGNOSIS — I200 Unstable angina: Secondary | ICD-10-CM | POA: Diagnosis not present

## 2019-03-29 DIAGNOSIS — D62 Acute posthemorrhagic anemia: Secondary | ICD-10-CM | POA: Diagnosis not present

## 2019-03-29 DIAGNOSIS — I25119 Atherosclerotic heart disease of native coronary artery with unspecified angina pectoris: Secondary | ICD-10-CM | POA: Diagnosis not present

## 2019-03-29 DIAGNOSIS — I130 Hypertensive heart and chronic kidney disease with heart failure and stage 1 through stage 4 chronic kidney disease, or unspecified chronic kidney disease: Secondary | ICD-10-CM | POA: Diagnosis present

## 2019-03-29 DIAGNOSIS — R918 Other nonspecific abnormal finding of lung field: Secondary | ICD-10-CM | POA: Diagnosis not present

## 2019-03-29 DIAGNOSIS — I25118 Atherosclerotic heart disease of native coronary artery with other forms of angina pectoris: Secondary | ICD-10-CM | POA: Diagnosis not present

## 2019-03-29 DIAGNOSIS — J9811 Atelectasis: Secondary | ICD-10-CM | POA: Diagnosis not present

## 2019-03-29 DIAGNOSIS — M545 Low back pain: Secondary | ICD-10-CM | POA: Diagnosis present

## 2019-03-29 DIAGNOSIS — Z91040 Latex allergy status: Secondary | ICD-10-CM

## 2019-03-29 DIAGNOSIS — N183 Chronic kidney disease, stage 3 unspecified: Secondary | ICD-10-CM | POA: Diagnosis present

## 2019-03-29 DIAGNOSIS — J90 Pleural effusion, not elsewhere classified: Secondary | ICD-10-CM

## 2019-03-29 DIAGNOSIS — Z20828 Contact with and (suspected) exposure to other viral communicable diseases: Secondary | ICD-10-CM | POA: Diagnosis not present

## 2019-03-29 DIAGNOSIS — I2510 Atherosclerotic heart disease of native coronary artery without angina pectoris: Secondary | ICD-10-CM | POA: Diagnosis not present

## 2019-03-29 DIAGNOSIS — Z8673 Personal history of transient ischemic attack (TIA), and cerebral infarction without residual deficits: Secondary | ICD-10-CM | POA: Diagnosis not present

## 2019-03-29 DIAGNOSIS — I25110 Atherosclerotic heart disease of native coronary artery with unstable angina pectoris: Secondary | ICD-10-CM | POA: Diagnosis not present

## 2019-03-29 DIAGNOSIS — Z8249 Family history of ischemic heart disease and other diseases of the circulatory system: Secondary | ICD-10-CM

## 2019-03-29 DIAGNOSIS — Z96611 Presence of right artificial shoulder joint: Secondary | ICD-10-CM | POA: Diagnosis present

## 2019-03-29 DIAGNOSIS — Z96652 Presence of left artificial knee joint: Secondary | ICD-10-CM | POA: Diagnosis present

## 2019-03-29 DIAGNOSIS — R7303 Prediabetes: Secondary | ICD-10-CM | POA: Diagnosis not present

## 2019-03-29 DIAGNOSIS — Z981 Arthrodesis status: Secondary | ICD-10-CM

## 2019-03-29 DIAGNOSIS — Z7982 Long term (current) use of aspirin: Secondary | ICD-10-CM | POA: Diagnosis not present

## 2019-03-29 DIAGNOSIS — K219 Gastro-esophageal reflux disease without esophagitis: Secondary | ICD-10-CM | POA: Diagnosis not present

## 2019-03-29 DIAGNOSIS — Z951 Presence of aortocoronary bypass graft: Secondary | ICD-10-CM

## 2019-03-29 DIAGNOSIS — Z905 Acquired absence of kidney: Secondary | ICD-10-CM

## 2019-03-29 DIAGNOSIS — I77810 Thoracic aortic ectasia: Secondary | ICD-10-CM | POA: Diagnosis present

## 2019-03-29 DIAGNOSIS — E785 Hyperlipidemia, unspecified: Secondary | ICD-10-CM | POA: Diagnosis present

## 2019-03-29 DIAGNOSIS — R079 Chest pain, unspecified: Secondary | ICD-10-CM | POA: Diagnosis not present

## 2019-03-29 DIAGNOSIS — I9789 Other postprocedural complications and disorders of the circulatory system, not elsewhere classified: Secondary | ICD-10-CM | POA: Diagnosis not present

## 2019-03-29 DIAGNOSIS — E7800 Pure hypercholesterolemia, unspecified: Secondary | ICD-10-CM | POA: Diagnosis not present

## 2019-03-29 DIAGNOSIS — I5031 Acute diastolic (congestive) heart failure: Secondary | ICD-10-CM | POA: Diagnosis not present

## 2019-03-29 DIAGNOSIS — I4891 Unspecified atrial fibrillation: Secondary | ICD-10-CM | POA: Diagnosis not present

## 2019-03-29 DIAGNOSIS — I2584 Coronary atherosclerosis due to calcified coronary lesion: Secondary | ICD-10-CM | POA: Diagnosis present

## 2019-03-29 DIAGNOSIS — I10 Essential (primary) hypertension: Secondary | ICD-10-CM | POA: Diagnosis not present

## 2019-03-29 DIAGNOSIS — Z79891 Long term (current) use of opiate analgesic: Secondary | ICD-10-CM

## 2019-03-29 DIAGNOSIS — I2089 Other forms of angina pectoris: Secondary | ICD-10-CM | POA: Diagnosis present

## 2019-03-29 DIAGNOSIS — E669 Obesity, unspecified: Secondary | ICD-10-CM | POA: Diagnosis present

## 2019-03-29 DIAGNOSIS — Z6837 Body mass index (BMI) 37.0-37.9, adult: Secondary | ICD-10-CM

## 2019-03-29 DIAGNOSIS — Z87891 Personal history of nicotine dependence: Secondary | ICD-10-CM

## 2019-03-29 DIAGNOSIS — R931 Abnormal findings on diagnostic imaging of heart and coronary circulation: Secondary | ICD-10-CM | POA: Diagnosis not present

## 2019-03-29 DIAGNOSIS — M170 Bilateral primary osteoarthritis of knee: Secondary | ICD-10-CM | POA: Diagnosis not present

## 2019-03-29 DIAGNOSIS — Z85528 Personal history of other malignant neoplasm of kidney: Secondary | ICD-10-CM

## 2019-03-29 DIAGNOSIS — F419 Anxiety disorder, unspecified: Secondary | ICD-10-CM | POA: Diagnosis present

## 2019-03-29 DIAGNOSIS — Z79899 Other long term (current) drug therapy: Secondary | ICD-10-CM | POA: Diagnosis not present

## 2019-03-29 DIAGNOSIS — I480 Paroxysmal atrial fibrillation: Secondary | ICD-10-CM | POA: Diagnosis not present

## 2019-03-29 DIAGNOSIS — I208 Other forms of angina pectoris: Secondary | ICD-10-CM | POA: Diagnosis present

## 2019-03-29 DIAGNOSIS — R0602 Shortness of breath: Secondary | ICD-10-CM | POA: Diagnosis not present

## 2019-03-29 DIAGNOSIS — Z7902 Long term (current) use of antithrombotics/antiplatelets: Secondary | ICD-10-CM

## 2019-03-29 LAB — TROPONIN I (HIGH SENSITIVITY)
Troponin I (High Sensitivity): 4 ng/L (ref <18)
Troponin I (High Sensitivity): 6 ng/L (ref <18)

## 2019-03-29 LAB — BASIC METABOLIC PANEL
Anion gap: 11 (ref 5–15)
BUN: 25 mg/dL — ABNORMAL HIGH (ref 8–23)
CO2: 20 mmol/L — ABNORMAL LOW (ref 22–32)
Calcium: 9.2 mg/dL (ref 8.9–10.3)
Chloride: 106 mmol/L (ref 98–111)
Creatinine, Ser: 1.51 mg/dL — ABNORMAL HIGH (ref 0.61–1.24)
GFR calc Af Amer: 51 mL/min — ABNORMAL LOW (ref >60)
GFR calc non Af Amer: 44 mL/min — ABNORMAL LOW (ref >60)
Glucose, Bld: 109 mg/dL — ABNORMAL HIGH (ref 70–99)
Potassium: 3.9 mmol/L (ref 3.5–5.1)
Sodium: 137 mmol/L (ref 135–145)

## 2019-03-29 LAB — SARS CORONAVIRUS 2 (TAT 6-24 HRS): SARS Coronavirus 2: NEGATIVE

## 2019-03-29 LAB — MAGNESIUM: Magnesium: 2.2 mg/dL (ref 1.7–2.4)

## 2019-03-29 LAB — HEPATIC FUNCTION PANEL
ALT: 15 U/L (ref 0–44)
AST: 14 U/L — ABNORMAL LOW (ref 15–41)
Albumin: 3.9 g/dL (ref 3.5–5.0)
Alkaline Phosphatase: 83 U/L (ref 38–126)
Bilirubin, Direct: <0.1 mg/dL (ref 0.0–0.2)
Total Bilirubin: 0.7 mg/dL (ref 0.3–1.2)
Total Protein: 7.1 g/dL (ref 6.5–8.1)

## 2019-03-29 LAB — PROTIME-INR
INR: 1 (ref 0.8–1.2)
Prothrombin Time: 13 seconds (ref 11.4–15.2)

## 2019-03-29 LAB — CBC
HCT: 43.1 % (ref 39.0–52.0)
Hemoglobin: 14.5 g/dL (ref 13.0–17.0)
MCH: 31.4 pg (ref 26.0–34.0)
MCHC: 33.6 g/dL (ref 30.0–36.0)
MCV: 93.3 fL (ref 80.0–100.0)
Platelets: 265 10*3/uL (ref 150–400)
RBC: 4.62 MIL/uL (ref 4.22–5.81)
RDW: 12.9 % (ref 11.5–15.5)
WBC: 8 10*3/uL (ref 4.0–10.5)
nRBC: 0 % (ref 0.0–0.2)

## 2019-03-29 LAB — TSH: TSH: 3.34 u[IU]/mL (ref 0.350–4.500)

## 2019-03-29 LAB — BRAIN NATRIURETIC PEPTIDE: B Natriuretic Peptide: 50.2 pg/mL (ref 0.0–100.0)

## 2019-03-29 LAB — APTT: aPTT: 36 seconds (ref 24–36)

## 2019-03-29 LAB — T4, FREE: Free T4: 0.96 ng/dL (ref 0.61–1.12)

## 2019-03-29 MED ORDER — SODIUM CHLORIDE 0.9% FLUSH
3.0000 mL | Freq: Once | INTRAVENOUS | Status: AC
  Administered 2019-03-29: 3 mL via INTRAVENOUS

## 2019-03-29 MED ORDER — HEPARIN (PORCINE) 25000 UT/250ML-% IV SOLN
1350.0000 [IU]/h | INTRAVENOUS | Status: DC
  Administered 2019-03-29 (×2): 1150 [IU]/h via INTRAVENOUS
  Administered 2019-03-30 – 2019-04-01 (×3): 1350 [IU]/h via INTRAVENOUS
  Filled 2019-03-29 (×4): qty 250

## 2019-03-29 MED ORDER — METOPROLOL TARTRATE 25 MG PO TABS
25.0000 mg | ORAL_TABLET | Freq: Two times a day (BID) | ORAL | Status: DC
  Administered 2019-03-29 – 2019-04-01 (×6): 25 mg via ORAL
  Filled 2019-03-29 (×6): qty 1

## 2019-03-29 MED ORDER — ONDANSETRON HCL 4 MG/2ML IJ SOLN: 4.0000 mg | Freq: Four times a day (QID) | INTRAMUSCULAR | Status: DC | PRN

## 2019-03-29 MED ORDER — PANTOPRAZOLE SODIUM 40 MG PO TBEC
40.0000 mg | DELAYED_RELEASE_TABLET | Freq: Every evening | ORAL | Status: DC
  Administered 2019-03-29 – 2019-04-01 (×4): 40 mg via ORAL
  Filled 2019-03-29 (×4): qty 1

## 2019-03-29 MED ORDER — FUROSEMIDE 20 MG PO TABS
20.0000 mg | ORAL_TABLET | Freq: Every day | ORAL | Status: DC
  Administered 2019-03-30 – 2019-03-31 (×2): 20 mg via ORAL
  Filled 2019-03-29 (×2): qty 1

## 2019-03-29 MED ORDER — LORAZEPAM 0.5 MG PO TABS: 0.5000 mg | ORAL_TABLET | Freq: Two times a day (BID) | ORAL | Status: DC | PRN

## 2019-03-29 MED ORDER — ATORVASTATIN CALCIUM 80 MG PO TABS
80.0000 mg | ORAL_TABLET | Freq: Every evening | ORAL | Status: DC
  Administered 2019-03-29 – 2019-04-01 (×4): 80 mg via ORAL
  Filled 2019-03-29 (×4): qty 1

## 2019-03-29 MED ORDER — NITROGLYCERIN IN D5W 200-5 MCG/ML-% IV SOLN
0.0000 ug/min | INTRAVENOUS | Status: DC
  Administered 2019-03-29: 22:00:00 3.333 ug/min via INTRAVENOUS

## 2019-03-29 MED ORDER — ACETAMINOPHEN 500 MG PO TABS
1000.0000 mg | ORAL_TABLET | Freq: Once | ORAL | Status: AC
  Administered 2019-03-29: 1000 mg via ORAL
  Filled 2019-03-29: qty 2

## 2019-03-29 MED ORDER — ASPIRIN EC 81 MG PO TBEC
81.0000 mg | DELAYED_RELEASE_TABLET | Freq: Every day | ORAL | Status: DC
  Administered 2019-03-30 – 2019-04-01 (×3): 81 mg via ORAL
  Filled 2019-03-29 (×3): qty 1

## 2019-03-29 MED ORDER — ALPRAZOLAM 0.25 MG PO TABS: 0.2500 mg | ORAL_TABLET | Freq: Two times a day (BID) | ORAL | Status: DC | PRN

## 2019-03-29 MED ORDER — ASPIRIN 81 MG PO CHEW
324.0000 mg | CHEWABLE_TABLET | ORAL | Status: AC
  Administered 2019-03-29: 324 mg via ORAL
  Filled 2019-03-29: qty 4

## 2019-03-29 MED ORDER — ASPIRIN 300 MG RE SUPP
300.0000 mg | RECTAL | Status: AC
  Filled 2019-03-29: qty 1

## 2019-03-29 MED ORDER — SODIUM CHLORIDE 0.9 % IV SOLN
INTRAVENOUS | Status: DC
  Administered 2019-03-29: 22:00:00 via INTRAVENOUS

## 2019-03-29 MED ORDER — LOSARTAN POTASSIUM 50 MG PO TABS
50.0000 mg | ORAL_TABLET | Freq: Every day | ORAL | Status: DC
  Administered 2019-03-30 – 2019-04-01 (×3): 50 mg via ORAL
  Filled 2019-03-29 (×3): qty 1

## 2019-03-29 MED ORDER — SODIUM CHLORIDE 0.9% FLUSH
3.0000 mL | Freq: Two times a day (BID) | INTRAVENOUS | Status: DC
  Administered 2019-03-29 – 2019-03-31 (×3): 3 mL via INTRAVENOUS

## 2019-03-29 MED ORDER — NITROGLYCERIN 0.4 MG SL SUBL: 0.4000 mg | SUBLINGUAL_TABLET | SUBLINGUAL | Status: DC | PRN

## 2019-03-29 MED ORDER — ACETAMINOPHEN 325 MG PO TABS: 650.0000 mg | ORAL_TABLET | ORAL | Status: DC | PRN

## 2019-03-29 MED ORDER — ZOLPIDEM TARTRATE 5 MG PO TABS
5.0000 mg | ORAL_TABLET | Freq: Every evening | ORAL | Status: DC | PRN
  Administered 2019-03-30 – 2019-03-31 (×2): 5 mg via ORAL
  Filled 2019-03-29 (×2): qty 1

## 2019-03-29 MED ORDER — ALLOPURINOL 300 MG PO TABS
300.0000 mg | ORAL_TABLET | Freq: Every day | ORAL | Status: DC | PRN
  Administered 2019-03-31 – 2019-04-01 (×2): 300 mg via ORAL
  Filled 2019-03-29 (×2): qty 1

## 2019-03-29 MED ORDER — NITROGLYCERIN IN D5W 200-5 MCG/ML-% IV SOLN
0.0000 ug/min | INTRAVENOUS | Status: DC
  Administered 2019-03-29: 18:00:00 5 ug/min via INTRAVENOUS
  Filled 2019-03-29: qty 250

## 2019-03-29 MED ORDER — HYDROCODONE-ACETAMINOPHEN 5-325 MG PO TABS: 1.0000 | ORAL_TABLET | Freq: Four times a day (QID) | ORAL | Status: DC | PRN

## 2019-03-29 MED ORDER — HEPARIN BOLUS VIA INFUSION
4000.0000 [IU] | Freq: Once | INTRAVENOUS | Status: AC
  Administered 2019-03-29: 18:00:00 4000 [IU] via INTRAVENOUS
  Filled 2019-03-29: qty 4000

## 2019-03-29 MED ORDER — ASPIRIN 81 MG PO TBEC: 81.0000 mg | DELAYED_RELEASE_TABLET | Freq: Every day | ORAL | Status: DC

## 2019-03-29 NOTE — H&P (Addendum)
Cardiology Admission History and Physical:   Patient ID: Jake Dunn MRN: [id number]; DOB: 1940/05/14   Admission date: 03/29/2019  Primary Care Provider: Harlan Stains, MD Primary Cardiologist: Peter Martinique, MD  Primary Electrophysiologist:  None   Chief Complaint:  Chest pain  Patient Profile:   Jake Dunn is a 78 y.o. male with increasing SOB and chest pain and known CAD, along with GERD, HTN, HL renal cell CA s/p left nephrectomy and cardiac cath for chest pain 03/26/19 with 3 vessel disease and recommendation for CABG.  Now admitted for crescendo angina while waiting for CABG.   History of Present Illness:   [name] with above hx and hx of orthostatic hypotension and syncope with higher doses of imdur and amlodipine. He was placed on lower doses.  He also has cervical disc disease, followed by Dr. Ellene Route.  He was seen for SOB and decreased energy and chest pain.   Pt had cath with significant disease.  Plan was for outppt TCTS appt for CABG eval. And was having angina so sent to ER for admit.   Today he was seen by Dr. Kipp Brood and pt noted his pain had increased enough he almost called EMS last night.  He had NTG in Dr. Abran Duke office.    He had been on plavix but stopped with plan for CABG.    Now in ER pain better.  EKG without acute MI.  Troponin 4 HGB 14.5 Plts 265 and WBC 8.0   VS stable   EKG:  The ECG was done was personally reviewed and demonstrates SR with non specific ST abnormalities.    Heart Pathway Score:     Past Medical History:  Diagnosis Date  . Adenomatous polyp   . Anxiety   . Arthritis    "knees, ankles, shoulders" (08/15/2016)  . CAD (coronary artery disease)    s/p cath in 2014 showing significant stenosis in the diagonal side branches and in the RV marginal branch. These vessels were small and diffusely diseased. He is managed medically. 08/16/16 Cath, no disease progression  . Chronic lower back pain   . Disc disease, degenerative,  cervical   . GERD (gastroesophageal reflux disease)   . Gout   . Hyperlipidemia   . Hypertension   . Osteoarthritis   . Renal cell carcinoma    left kidney removal  . Stroke [facility])    MINI STROKE 15+ YRS AGO, no residual  . Stroke ([place])    "I've had 2 or 3"; denies residual on 08/15/2016  . Syncope and collapse     Past Surgical History:  Procedure Laterality Date  . ANTERIOR CERVICAL DECOMP/DISCECTOMY FUSION  02/2004; 04/2005   [name] 5/14/2012Marland Kitchen [name] 09/20/2010  . APPLICATION OF ROBOTIC ASSISTANCE FOR SPINAL PROCEDURE N/A 01/02/2018   Procedure: APPLICATION OF ROBOTIC ASSISTANCE FOR SPINAL PROCEDURE;  Surgeon: Kristeen Miss, MD;  Location: [place];  Service: Neurosurgery;  Laterality: N/A;  . BACK SURGERY    . CARDIAC CATHETERIZATION  12/14/2008   ef 50-55%. SHOWED SIGNIFICANT STENOSIS AND TO DIAGONAL SIDE BRANCHES AND IN THE RIGHT VENTRICULAR MARGINAL BRANCH. THESE VESSELS WERE SMALL AND DIFFUSELY DISEASED  . CARDIOVASCULAR STRESS TEST  12/10/2009   EF 61%. EKG negative. Mild peri ischemia. Managed medically.   . CARPAL TUNNEL RELEASE Right 11/2005   [name] 09/20/2010  . CARPAL TUNNEL RELEASE Left 06/2007   [name] 5/132012  . COLONOSCOPY  11/2004   [name] 09/20/2010  . COLONOSCOPY W/ BIOPSIES AND POLYPECTOMY  09/2002   /  notes 09/20/2010  . INCISION AND DRAINAGE ABSCESS Right 10/18/2016   Procedure: INCISION AND DRAINAGE ABSCESS RIGHT SHOULDER;  Surgeon: [name], MD;  Location: [place];  Service: Orthopedics;  Laterality: Right;  . INCISION AND DRAINAGE OF WOUND Right 08/2005   shoulder/notes 09/20/2010  . IR FLUORO GUIDE CV LINE LEFT  10/19/2016  . IR US GUIDE VASC ACCESS LEFT  10/19/2016  . IRRIGATION AND DEBRIDEMENT SHOULDER Right 12/22/2016   Procedure: IRRIGATION AND DEBRIDEMENT RIGHT SHOULDER;  Surgeon: [name], MD;  Location: [place];  Service: Orthopedics;  Laterality: Right;  . JOINT REPLACEMENT    . LEFT HEART CATH AND CORONARY ANGIOGRAPHY N/A  08/16/2016   Procedure: Left Heart Cath and Coronary Angiography;  Surgeon: [name], MD;  Location: [place] CV LAB;  Service: Cardiovascular;  Laterality: N/A;  . LEFT HEART CATH AND CORONARY ANGIOGRAPHY N/A 03/26/2019   Procedure: LEFT HEART CATH AND CORONARY ANGIOGRAPHY;  Surgeon: Martinique, Peter M, MD;  Location: [place] CV [facility];  Service: Cardiovascular;  Laterality: N/A;  . LEFT HEART CATHETERIZATION WITH CORONARY ANGIOGRAM N/A 08/09/2012   Procedure: LEFT HEART CATHETERIZATION WITH CORONARY ANGIOGRAM;  Surgeon: Peter M Martinique, MD;  Location: [facility] CATH LAB;  Service: Cardiovascular;  Laterality: N/A;  . LUMBAR FUSION    . NEPHRECTOMY Left early 2000s  . REPLACEMENT TOTAL KNEE Right 08/2008   [name] 09/07/2010  . SHOULDER ARTHROSCOPY WITH ROTATOR CUFF REPAIR Right 06/2005   [name] 09/20/2010  . TOTAL KNEE ARTHROPLASTY  06/08/2011   Procedure: TOTAL KNEE ARTHROPLASTY;  Surgeon: [name], MD;  Location: [place];  Service: Orthopedics;  Laterality: Left;  . TOTAL SHOULDER ARTHROPLASTY Right 04/06/2016   Procedure: RIGHT REVERSE TOTAL SHOULDER ARTHROPLASTY;  Surgeon: [name], MD;  Location: [place];  Service: Orthopedics;  Laterality: Right;  . TOTAL SHOULDER REVISION Right 12/22/2016   Procedure: RIGHT SHOULDER GLENOID AND HUMERAL COMPONENT REVISION;  Surgeon: [name], MD;  Location: [place];  Service: Orthopedics;  Laterality: Right;  . US ECHOCARDIOGRAPHY  12/15/2008   EF 60-65%  . US ECHOCARDIOGRAPHY  09/28/2004   EF 55-60%     Medications Prior to Admission: Prior to Admission medications   Medication Sig Start Date End Date Taking? Authorizing Provider  acyclovir (ZOVIRAX) 400 MG tablet Take 400 mg by mouth 3 (three) times daily as needed (For cold sores). . 10/05/16   [provider]  allopurinol (ZYLOPRIM) 300 MG tablet Take 300 mg by mouth daily as needed (for gout flares).     [provider]  aspirin EC 81 MG EC tablet Take 1 tablet (81 mg  total) by mouth daily. Patient taking differently: Take 81 mg by mouth every evening.  08/17/16   Cheryln Manly, NP  atorvastatin (LIPITOR) 80 MG tablet Take 1 tablet (80 mg total) by mouth every evening. 08/17/16   Reino Bellis B, NP  EPINEPHrine 0.3 mg/0.3 mL IJ SOAJ injection Inject 0.3 mg into the muscle as needed for anaphylaxis.  08/07/15   [provider]  fluticasone (FLONASE) 50 MCG/ACT nasal spray Place 1 spray into both nostrils daily as needed for allergies or rhinitis.    [provider]  furosemide (LASIX) 20 MG tablet Take 1 tablet (20 mg total) by mouth daily. 03/19/19 03/13/20  Martinique, Peter M, MD  HYDROcodone-acetaminophen (NORCO/VICODIN) 5-325 MG tablet Take 1 tablet by mouth every 6 (six) hours as needed for moderate pain or severe pain.  11/06/17  [provider]  isosorbide mononitrate (IMDUR) 60 MG 24 hr tablet Take 1 tablet (60 mg total) by mouth daily. 01/08/19   Martinique, Peter M, MD  LORazepam (ATIVAN) 0.5 MG tablet Take 0.5 mg by mouth 2 (two) times daily as needed for anxiety.  08/07/15   [provider]  losartan (COZAAR) 25 MG tablet Take 50 mg by mouth daily.     [provider]  metoprolol tartrate (LOPRESSOR) 25 MG tablet Take 25 mg by mouth 2 (two) times daily.    [provider]  nitroGLYCERIN (NITROSTAT) 0.4 MG SL tablet Place 1 tablet (0.4 mg total) under the tongue every 5 (five) minutes x 3 doses as needed for chest pain. 01/08/19   Martinique, Peter M, MD  pantoprazole (PROTONIX) 40 MG tablet Take 40 mg by mouth every evening.     [provider]  tetrahydrozoline 0.05 % ophthalmic solution Place 1 drop into both eyes daily as needed (irritation).    [provider]     Allergies:    Allergies  Allergen Reactions  . Adhesive [Tape] Rash    Paper tape okay  . Latex Rash    Social History:   Social History   Socioeconomic History  . Marital status: Married    Spouse name: Not on file   . Number of children: Not on file  . Years of education: Not on file  . Highest education level: Not on file  Occupational History  . Not on file  Social Needs  . Financial resource strain: Not on file  . Food insecurity    Worry: Not on file    Inability: Not on file  . Transportation needs    Medical: Not on file    Non-medical: Not on file  Tobacco Use  . Smoking status: Former Smoker    Quit date: 05/31/1989    Years since quitting: 29.8  . Smokeless tobacco: Never Used  . Tobacco comment: 08/15/2016 "hadn't smoked a carton of cigarettes all my life"  Substance and Sexual Activity  . Alcohol use: Yes    Alcohol/week: 0.0 standard drinks    Comment: "sometimes daily, sometimes weekly" beer  . Drug use: No  . Sexual activity: Not Currently  Lifestyle  . Physical activity    Days per week: Not on file    Minutes per session: Not on file  . Stress: Not on file  Relationships  . Social Herbalist on phone: Not on file    Gets together: Not on file    Attends religious service: Not on file    Active member of club or organization: Not on file    Attends meetings of clubs or organizations: Not on file    Relationship status: Not on file  . Intimate partner violence    Fear of current or ex partner: Not on file    Emotionally abused: Not on file    Physically abused: Not on file    Forced sexual activity: Not on file  Other Topics Concern  . Not on file  Social History Narrative  . Not on file    Family History:   The patient's family history includes Heart attack in his father; Heart disease in his brother. There is no history of Allergic rhinitis, Angioedema, Asthma, Atopy, Eczema, Immunodeficiency, or Urticaria.    ROS:  Please see the history of present illness.  General:no colds or fevers, no weight changes Skin:no rashes or ulcers HEENT:no blurred  vision, no congestion CV:see HPI PUL:see HPI GI:no diarrhea constipation or melena, + GERD GU:no  hematuria, no dysuria MS:no joint pain, no claudication, chronic disc disease  Neuro:hx of  Syncope none recently, no lightheadedness, hx of CVA Endo:no diabetes, no thyroid disease  All other ROS reviewed and negative.     Physical Exam/Data:   Vitals:   03/29/19 1506 03/29/19 1507 03/29/19 1717  BP: 137/79  125/82  Pulse: 70  (!) 54  Resp: 20  17  Temp: 98.3 F (36.8 C)    TempSrc: Oral    SpO2: 99%  99%  Weight:  104.3 kg   Height:  5\' 7"  (1.702 m)    No intake or output data in the 24 hours ending 03/29/19 1728 Last 3 Weights 03/29/2019 03/29/2019 03/26/2019  Weight (lbs) 230 lb 230 lb 230 lb  Weight (kg) 104.327 kg 104.327 kg 104.327 kg     Body mass index is 36.02 kg/m.  Per Dr. Marlou Porch General:  Well nourished, well developed, in no acute distress HEENT: normal Lymph: no adenopathy Neck: no JVD Endocrine:  No thryomegaly Vascular: No carotid bruits; FA pulses 2+ bilaterally without bruits  Cardiac:  normal S1, S2; RRR; no murmur  Lungs:  clear to auscultation bilaterally, no wheezing, rhonchi or rales  Abd: soft, nontender, no hepatomegaly  Ext: no edema Musculoskeletal:  No deformities, BUE and BLE strength normal and equal Skin: warm and dry  Neuro:  CNs 2-12 intact, no focal abnormalities noted Psych:  Normal affect     Relevant CV Studies: Cardiac cath  03/26/19  Prox RCA lesion is 45% stenosed.  2nd RPL lesion is 80% stenosed.  Ost LM to Mid LM lesion is 50% stenosed.  Ost Cx to Mid Cx lesion is 80% stenosed.  Ramus lesion is 90% stenosed.  Mid LAD lesion is 70% stenosed.  2nd Diag lesion is 60% stenosed.  The left ventricular systolic function is normal.  LV end diastolic pressure is normal.  The left ventricular ejection fraction is 55-65% by visual estimate.   1. Three vessel obstructive, Calcific CAD 2. Normal LV function 3. Normal LVEDP  Plan: recommend referral to CT surgery for CABG. Will stop Plavix. Coronary Diagrams   Diagnostic Dominance: Right   Laboratory Data:  High Sensitivity Troponin:   Recent Labs  Lab 03/29/19 1536  TROPONINIHS 4      Chemistry Recent Labs  Lab 03/29/19 1536  NA 137  K 3.9  CL 106  CO2 20*  GLUCOSE 109*  BUN 25*  CREATININE 1.51*  CALCIUM 9.2  GFRNONAA 44*  GFRAA 51*  ANIONGAP 11    No results for input(s): PROT, ALBUMIN, AST, ALT, ALKPHOS, BILITOT in the last 168 hours. Hematology Recent Labs  Lab 03/29/19 1536  WBC 8.0  RBC 4.62  HGB 14.5  HCT 43.1  MCV 93.3  MCH 31.4  MCHC 33.6  RDW 12.9  PLT 265   BNPNo results for input(s): BNP, PROBNP in the last 168 hours.  DDimer No results for input(s): DDIMER in the last 168 hours.   Radiology/Studies:  Dg Chest 2 Dunn  Result Date: 03/29/2019 CLINICAL DATA:  Chest pain. Shortness of breath. EXAM: CHEST - 2 Dunn COMPARISON:  Chest x-ray dated 03/19/2019 FINDINGS: The heart size and pulmonary vascularity are. Aortic atherosclerosis. The lungs are clear. No significant bone abnormality. No effusions. IMPRESSION: 1. No active cardiopulmonary disease. 2.  Aortic Atherosclerosis (ICD10-I70.0). Electronically Signed   By: Lorriane Shire M.D.   On:  03/29/2019 15:38    Assessment and Plan:   1. Crescendo angina in pt with known 3 vessel CAD and need for CABG.  His plavix stopped on the 17th with plan for CABG. Troponin neg.  Will add IV heparin and IV NTG. Will check COVID.   2.  HTN continue meds 3. Hx of CVA  On ASA and plavix now held 4. Cervical/lumbar spine disease followed by Dr. Ellene Route 5. Hx of diastolic HF      Severity of Illness: The appropriate patient status for this patient is INPATIENT. Inpatient status is judged to be reasonable and necessary in order to provide the required intensity of service to ensure the patient's safety. The patient's presenting symptoms, physical exam findings, and initial radiographic and laboratory data in the context of their chronic comorbidities is felt to place  them at high risk for further clinical deterioration. Furthermore, it is not anticipated that the patient will be medically stable for discharge from the hospital within 2 midnights of admission. The following factors support the patient status of inpatient.   " The patient's presenting symptoms include increasing angina. " The worrisome physical exam findings include chest pain. " The initial radiographic and laboratory data are worrisome because of significant CAD. " The chronic co-morbidities include HTN, HLD.   * I certify that at the point of admission it is my clinical judgment that the patient will require inpatient hospital care spanning beyond 2 midnights from the point of admission due to high intensity of service, high risk for further deterioration and high frequency of surveillance required.*    For questions or updates, please contact [place] Please consult [email or web address] for contact info under        Signed, Cecilie Kicks, NP  03/29/2019 5:28 PM   Personally seen and examined. Agree with above.   78 year old male with unstable angina with known severe coronary artery disease who began to have chest pressure while seen Dr. Kipp Brood with cardiothoracic surgery who then sent him to the emergency room for further evaluation and admission for CABG on Monday.  Currently he is resting comfortably in the ER with nitroglycerin drip with no significant chest pain.  It seems to have subsided.  His father and his 3 uncles have had coronary artery disease and died early.  Cancer was on his mother side of the family.  GEN: Well nourished, well developed, in no acute distress  HEENT: normal  Neck: no JVD, carotid bruits, or masses Cardiac: RRR; no murmurs, rubs, or gallops,no edema  Respiratory:  clear to auscultation bilaterally, normal work of breathing GI: soft, nontender, nondistended, + BS, protuberant MS: no deformity or atrophy  Skin: warm and dry, no rash Neuro:   Alert and Oriented x 3, Strength and sensation are intact Psych: euthymic mood, full affect   Troponin-4, 6 EKG with nonspecific ST-T wave changes.  Personally reviewed. Creatinine 1.5 Hemoglobin 14.5 Covid test was negative on 03/22/2019.  New test pending.  No fevers chills nausea vomiting syncope cough  Assessment and plan:  Unstable angina with severe three-vessel coronary artery disease -We will place on IV nitroglycerin and IV heparin as stated above.  Continue with high intensity statin.  No Plavix.  Continue with aspirin.  Awaiting surgery on Monday. -Contact surgical team tomorrow for preop vascular studies orders. -We will repeat Covid test.  No evidence of disease currently.  Essential hypertension -Currently stable, continue to follow.  Candee Furbish, MD

## 2019-03-29 NOTE — Progress Notes (Signed)
ANTICOAGULATION CONSULT NOTE - Initial Consult  Pharmacy Consult for heparin  Indication: chest pain/ACS  Allergies  Allergen Reactions  . Adhesive [Tape] Rash    Paper tape okay  . Latex Rash    Patient Measurements: Height: 5\' 7"  (170.2 cm) Weight: 230 lb (104.3 kg) IBW/kg (Calculated) : 66.1 Heparin Dosing Weight: 89.1kg   Vital Signs: Temp: 98.3 F (36.8 C) (11/20 1506) Temp Source: Oral (11/20 1506) BP: 125/82 (11/20 1717) Pulse Rate: 54 (11/20 1717)  Labs: Recent Labs    03/29/19 1536  HGB 14.5  HCT 43.1  PLT 265  CREATININE 1.51*  TROPONINIHS 4    Estimated Creatinine Clearance: 47.9 mL/min (A) (by C-G formula based on SCr of 1.51 mg/dL (H)).   Medical History: Past Medical History:  Diagnosis Date  . Adenomatous polyp   . Anxiety   . Arthritis    "knees, ankles, shoulders" (08/15/2016)  . CAD (coronary artery disease)    s/p cath in 2014 showing significant stenosis in the diagonal side branches and in the RV marginal branch. These vessels were small and diffusely diseased. He is managed medically. 08/16/16 Cath, no disease progression  . Chronic lower back pain   . Disc disease, degenerative, cervical   . GERD (gastroesophageal reflux disease)   . Gout   . Hyperlipidemia   . Hypertension   . Osteoarthritis   . Renal cell carcinoma    left kidney removal  . Stroke [facility])    MINI STROKE 15+ YRS AGO, no residual  . Stroke ([place])    "I've had 2 or 3"; denies residual on 08/15/2016  . Syncope and collapse     Medications:  (Not in a hospital admission)  Scheduled:  . acetaminophen  1,000 mg Oral Once  . sodium chloride flush  3 mL Intravenous Q12H  . sodium chloride flush  3 mL Intravenous Once    Assessment: 78 yo male presented on 03/29/2019 after referral by cardiologist. Patient had a heart cath on 11/17 and presented today to [facility] to discuss CABG scheduled for 04/01/2019 when patient reported angina at rest for  past 2-3 days and was referred for admission to the hospital.   Pharmacy consulted to dose heparin for ACS/STEMI. CBC stable. No reported bleeding. No anticoagulation prior to admission.   Goal of Therapy:  Heparin level 0.3-0.7 units/ml Monitor platelets by anticoagulation protocol: Yes   Plan:  Heparin 4000 units x1 bolus  Start heparin 1150 units/hr  Check heparin level at 0230 on 11/21 Monitor heparin level, CBC, and S/S of bleeding daily.  Cristela Felt, PharmD PGY1 Pharmacy Resident [employer]: [phone]  03/29/2019,5:41 PM

## 2019-03-29 NOTE — Progress Notes (Signed)
Patient arrived to unit, complaints of 2/10 chest pain that's "really eased off". Patient has nitro drip and heparin infusing.   Cardiac monitoring initiated and verified.  Will continue to monitor throughout the sift.

## 2019-03-29 NOTE — Plan of Care (Signed)
  Problem: Education: Goal: Knowledge of General Education information will improve Description Including pain rating scale, medication(s)/side effects and non-pharmacologic comfort measures Outcome: Progressing   Problem: Health Behavior/Discharge Planning: Goal: Ability to manage health-related needs will improve Outcome: Progressing   

## 2019-03-29 NOTE — ED Provider Notes (Signed)
[facility] EMERGENCY DEPARTMENT Provider Note   CSN: [id number] Arrival date & time: 03/29/19  1455     History   Chief Complaint Chief Complaint  Patient presents with  . Chest Pain    HPI Jake Dunn is a 78 y.o. male.     HPI  Pt is a 78 year old male with PMH of CAD (3 vessel disease), HTN, HLD, GERD who presents to the ED with concern for chest pain. Pt endorses 3 months of worsening chest pain. On Tuesday, he underwent LHC with evidence of multi-vessel disease. He is scheduled for CABG on Monday. Endorses 3 days of worsening chest pain and shortness of breath, mildly relieved by nitro. He stopped his plavix in preparation for his CABG. No cough or fever. Describes and heavy pain on the left side of his chest, mostly constant now with episodic worsening 4-5x daily at rest and with exertion.   Past Medical History:  Diagnosis Date  . Adenomatous polyp   . Anxiety   . Arthritis    "knees, ankles, shoulders" (08/15/2016)  . CAD (coronary artery disease)    s/p cath in 2014 showing significant stenosis in the diagonal side branches and in the RV marginal branch. These vessels were small and diffusely diseased. He is managed medically. 08/16/16 Cath, no disease progression  . Chronic lower back pain   . Disc disease, degenerative, cervical   . GERD (gastroesophageal reflux disease)   . Gout   . Hyperlipidemia   . Hypertension   . Osteoarthritis   . Renal cell carcinoma    left kidney removal  . Stroke [facility])    MINI STROKE 15+ YRS AGO, no residual  . Stroke ([place])    "I've had 2 or 3"; denies residual on 08/15/2016  . Syncope and collapse     Patient Active Problem List   Diagnosis Date Noted  . CAD, multiple vessel 03/29/2019  . Crescendo angina ([place]) 03/29/2019  . Spinal stenosis of lumbar region with radiculopathy 01/02/2018  . Antibiotic drug intolerance 03/01/2017  . 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[place]) 12/22/2016  . Medication monitoring  encounter 11/15/2016  . Abscess of right shoulder 10/18/2016  . Unstable angina ([place]) 08/15/2016  . Localized primary osteoarthritis of right shoulder region 04/06/2016  . Coughing/wheezing 08/17/2015  . Angioedema 08/17/2015  . Hypertension   . CAD (coronary artery disease)   . Stroke ([place])   . Hyperlipidemia   . Dyspnea 08/07/2012  . Pre-operative clearance 06/01/2011    Past Surgical History:  Procedure Laterality Date  . ANTERIOR CERVICAL DECOMP/DISCECTOMY FUSION  02/2004; 04/2005   [name] 5/14/2012Marland Kitchen [name] 09/20/2010  . APPLICATION OF ROBOTIC ASSISTANCE FOR SPINAL PROCEDURE N/A 01/02/2018   Procedure: APPLICATION OF ROBOTIC ASSISTANCE FOR SPINAL PROCEDURE;  Surgeon: Kristeen Miss, MD;  Location: [facility];  Service: Neurosurgery;  Laterality: N/A;  . BACK SURGERY    . CARDIAC CATHETERIZATION  12/14/2008   ef 50-55%. SHOWED SIGNIFICANT STENOSIS AND TO DIAGONAL SIDE BRANCHES AND IN THE RIGHT VENTRICULAR MARGINAL BRANCH. THESE VESSELS WERE SMALL AND DIFFUSELY DISEASED  . CARDIOVASCULAR STRESS TEST  12/10/2009   EF 61%. EKG negative. Mild peri ischemia. Managed medically.   . CARPAL TUNNEL RELEASE Right 11/2005   [name] 09/20/2010  . CARPAL TUNNEL RELEASE Left 06/2007   [name] 5/132012  . COLONOSCOPY  11/2004   [name] 09/20/2010  . COLONOSCOPY W/ BIOPSIES AND POLYPECTOMY  09/2002   [name] 09/20/2010  . INCISION AND DRAINAGE ABSCESS Right 10/18/2016  Procedure: INCISION AND DRAINAGE ABSCESS RIGHT SHOULDER;  Surgeon: [name], MD;  Location: [facility];  Service: Orthopedics;  Laterality: Right;  . INCISION AND DRAINAGE OF WOUND Right 08/2005   shoulder/notes 09/20/2010  . IR FLUORO GUIDE CV LINE LEFT  10/19/2016  . IR US GUIDE VASC ACCESS LEFT  10/19/2016  . IRRIGATION AND DEBRIDEMENT SHOULDER Right 12/22/2016   Procedure: IRRIGATION AND DEBRIDEMENT RIGHT SHOULDER;  Surgeon: [name], MD;  Location: [facility];  Service: Orthopedics;  Laterality: Right;  . JOINT  REPLACEMENT    . LEFT HEART CATH AND CORONARY ANGIOGRAPHY N/A 08/16/2016   Procedure: Left Heart Cath and Coronary Angiography;  Surgeon: [name], MD;  Location: [facility] CV LAB;  Service: Cardiovascular;  Laterality: N/A;  . LEFT HEART CATH AND CORONARY ANGIOGRAPHY N/A 03/26/2019   Procedure: LEFT HEART CATH AND CORONARY ANGIOGRAPHY;  Surgeon: Martinique, Peter M, MD;  Location: [facility] CV LAB;  Service: Cardiovascular;  Laterality: N/A;  . LEFT HEART CATHETERIZATION WITH CORONARY ANGIOGRAM N/A 08/09/2012   Procedure: LEFT HEART CATHETERIZATION WITH CORONARY ANGIOGRAM;  Surgeon: Peter M Martinique, MD;  Location: [facility] C[facility] LAB;  Service: Cardiovascular;  Laterality: N/A;  . LUMBAR FUSION    . NEPHRECTOMY Left early 2000s  . REPLACEMENT TOTAL KNEE Right 08/2008   [name] 09/07/2010  . SHOULDER ARTHROSCOPY WITH ROTATOR CUFF REPAIR Right 06/2005   [name] 09/20/2010  . TOTAL KNEE ARTHROPLASTY  06/08/2011   Procedure: TOTAL KNEE ARTHROPLASTY;  Surgeon: [name], MD;  Location: [facility];  Service: Orthopedics;  Laterality: Left;  . TOTAL SHOULDER ARTHROPLASTY Right 04/06/2016   Procedure: RIGHT REVERSE TOTAL SHOULDER ARTHROPLASTY;  Surgeon: [name], MD;  Location: [facility];  Service: Orthopedics;  Laterality: Right;  . TOTAL SHOULDER REVISION Right 12/22/2016   Procedure: RIGHT SHOULDER GLENOID AND HUMERAL COMPONENT REVISION;  Surgeon: [name], MD;  Location: [facility];  Service: Orthopedics;  Laterality: Right;  . US ECHOCARDIOGRAPHY  12/15/2008   EF 60-65%  . US ECHOCARDIOGRAPHY  09/28/2004   EF 55-60%        Home Medications    Prior to Admission medications   Medication Sig Start Date End Date Taking? Authorizing Provider  acyclovir (ZOVIRAX) 400 MG tablet Take 400 mg by mouth 3 (three) times daily as needed (For cold sores). . 10/05/16  Yes [provider]  allopurinol (ZYLOPRIM) 300 MG tablet Take 300 mg by mouth daily as needed (for gout flares).    Yes [provider]  aspirin EC 81 MG EC tablet Take 1 tablet (81 mg total) by mouth daily. Patient taking differently: Take 81 mg by mouth every evening.  08/17/16  Yes Cheryln Manly, NP  atorvastatin (LIPITOR) 80 MG tablet Take 1 tablet (80 mg total) by mouth every evening. 08/17/16  Yes Reino Bellis B, NP  EPINEPHrine 0.3 mg/0.3 mL IJ SOAJ injection Inject 0.3 mg into the muscle as needed for anaphylaxis.  08/07/15  Yes [provider]  furosemide (LASIX) 20 MG tablet Take 1 tablet (20 mg total) by mouth daily. 03/19/19 03/13/20 Yes Martinique, Peter M, MD  HYDROcodone-acetaminophen (NORCO/VICODIN) 5-325 MG tablet Take 1 tablet by mouth every 6 (six) hours as needed for moderate pain or severe pain.  11/06/17  Yes [provider]  isosorbide mononitrate (IMDUR) 60 MG 24 hr tablet Take 1 tablet (60 mg total) by mouth daily. 01/08/19  Yes Martinique, Peter M, MD  LORazepam (ATIVAN) 0.5 MG tablet  Take 0.5 mg by mouth 2 (two) times daily as needed for anxiety.  08/07/15  Yes [provider]  losartan (COZAAR) 25 MG tablet Take 50 mg by mouth daily.    Yes [provider]  metoprolol tartrate (LOPRESSOR) 25 MG tablet Take 25 mg by mouth 2 (two) times daily.   Yes [provider]  nitroGLYCERIN (NITROSTAT) 0.4 MG SL tablet Place 1 tablet (0.4 mg total) under the tongue every 5 (five) minutes x 3 doses as needed for chest pain. 01/08/19  Yes Martinique, Peter M, MD  pantoprazole (PROTONIX) 40 MG tablet Take 40 mg by mouth every evening.    Yes [provider]    Family History Family History  Problem Relation Age of Onset  . Heart attack Father   . Heart disease Brother   . Allergic rhinitis Neg Hx   . Angioedema Neg Hx   . Asthma Neg Hx   . Atopy Neg Hx   . Eczema Neg Hx   . Immunodeficiency Neg Hx   . Urticaria Neg Hx     Social History Social History   Tobacco Use  . Smoking status: Former Smoker    Quit date: 05/31/1989    Years since quitting:  29.8  . Smokeless tobacco: Never Used  . Tobacco comment: 08/15/2016 "hadn't smoked a carton of cigarettes all my life"  Substance Use Topics  . Alcohol use: Yes    Alcohol/week: 0.0 standard drinks    Comment: "sometimes daily, sometimes weekly" beer  . Drug use: No     Allergies   Adhesive [tape] and Latex   Review of Systems Review of Systems  Constitutional: Negative for chills and fever.  HENT: Negative for sore throat.   Eyes: Negative for pain and visual disturbance.  Respiratory: Positive for shortness of breath. Negative for cough.   Cardiovascular: Positive for chest pain. Negative for palpitations.  Gastrointestinal: Negative for abdominal pain and vomiting.  Genitourinary: Negative for dysuria and hematuria.  Musculoskeletal: Negative for arthralgias and back pain.  Skin: Negative for color change and rash.  Neurological: Negative for seizures and syncope.  All other systems reviewed and are negative.    Physical Exam Updated Vital Signs BP 118/79 (BP Location: Left Arm)   Pulse (!) 57   Temp 97.9 F (36.6 C) (Oral)   Resp 20   Ht 5\' 7"  (1.702 m)   Wt 102.8 kg   SpO2 97%   BMI 35.51 kg/m   Physical Exam Vitals signs and nursing note reviewed.  Constitutional:      Appearance: He is well-developed.  HENT:     Head: Normocephalic and atraumatic.  Eyes:     Extraocular Movements: Extraocular movements intact.     Conjunctiva/sclera: Conjunctivae normal.  Neck:     Musculoskeletal: Neck supple.  Cardiovascular:     Rate and Rhythm: Normal rate and regular rhythm.     Heart sounds: No murmur.  Pulmonary:     Effort: Pulmonary effort is normal. No respiratory distress.     Breath sounds: Normal breath sounds.  Abdominal:     Palpations: Abdomen is soft.     Tenderness: There is no abdominal tenderness.  Musculoskeletal:     Right lower leg: No edema.     Left lower leg: No edema.  Skin:    General: Skin is warm and dry.  Neurological:      General: No focal deficit present.     Mental Status: He is alert and  oriented to person, place, and time.      ED Treatments / Results  Labs (all labs ordered are listed, but only abnormal results are displayed) Labs Reviewed  BASIC METABOLIC PANEL - Abnormal; Notable for the following components:      Result Value   CO2 20 (*)    Glucose, Bld 109 (*)    BUN 25 (*)    Creatinine, Ser 1.51 (*)    GFR calc non Af Amer 44 (*)    GFR calc Af Amer 51 (*)    All other components within normal limits  HEPARIN LEVEL (UNFRACTIONATED) - Abnormal; Notable for the following components:   Heparin Unfractionated 0.25 (*)    All other components within normal limits  HEPATIC FUNCTION PANEL - Abnormal; Notable for the following components:   AST 14 (*)    All other components within normal limits  HEMOGLOBIN A1C - Abnormal; Notable for the following components:   Hgb A1c MFr Bld 5.8 (*)    All other components within normal limits  BASIC METABOLIC PANEL - Abnormal; Notable for the following components:   Glucose, Bld 123 (*)    BUN 24 (*)    Creatinine, Ser 1.39 (*)    GFR calc non Af Amer 49 (*)    GFR calc Af Amer 57 (*)    All other components within normal limits  LIPID PANEL - Abnormal; Notable for the following components:   Triglycerides 174 (*)    HDL 29 (*)    All other components within normal limits  SARS CORONAVIRUS 2 (TAT 6-24 HRS)  CBC  PROTIME-INR  APTT  CBC  MAGNESIUM  TSH  T4, FREE  BRAIN NATRIURETIC PEPTIDE  HEPARIN LEVEL (UNFRACTIONATED)  HEPARIN LEVEL (UNFRACTIONATED)  TROPONIN I (HIGH SENSITIVITY)  TROPONIN I (HIGH SENSITIVITY)    EKG EKG Interpretation  Date/Time:  Friday March 29 2019 15:08:59 EST Ventricular Rate:  73 PR Interval:  156 QRS Duration: 90 QT Interval:  396 QTC Calculation: 436 R Axis:   53 Text Interpretation: Normal sinus rhythm Nonspecific ST abnormality Abnormal ECG No STEMI Confirmed by Octaviano Glow [phone]) on 03/29/2019  4:50:48 PM Also confirmed by Lennice Sites [phone])  on 03/29/2019 5:12:10 PM   Radiology Dg Chest 2 View  Result Date: 03/29/2019 CLINICAL DATA:  Chest pain. Shortness of breath. EXAM: CHEST - 2 VIEW COMPARISON:  Chest x-ray dated 03/19/2019 FINDINGS: The heart size and pulmonary vascularity are. Aortic atherosclerosis. The lungs are clear. No significant bone abnormality. No effusions. IMPRESSION: 1. No active cardiopulmonary disease. 2.  Aortic Atherosclerosis (ICD10-I70.0). Electronically Signed   By: Lorriane Shire M.D.   On: 03/29/2019 15:38   Portable Chest X-ray 1 View  Result Date: 03/29/2019 CLINICAL DATA:  Chest pain at rest EXAM: PORTABLE CHEST 1 VIEW COMPARISON:  None. FINDINGS: No consolidation, features of edema, pneumothorax, or effusion. The aorta is calcified. The remaining cardiomediastinal contours are unremarkable. Prior reverse right shoulder arthroplasty. Cervical fixation hardware is noted as well. Degenerative changes are present in the imaged spine and left shoulder. No acute osseous or soft tissue abnormality. Surgical clips present in the left upper quadrant. IMPRESSION: No acute cardiopulmonary abnormality. Aortic Atherosclerosis (ICD10-I70.0). Electronically Signed   By: Lovena Le M.D.   On: 03/29/2019 21:30    Procedures Procedures (including critical care time)  Medications Ordered in ED Medications  heparin ADULT infusion 100 units/mL (25000 units/226mL sodium chloride 0.45%) (1,350 Units/hr Intravenous New Bag/Given 03/30/19 1009)  allopurinol (ZYLOPRIM) tablet 300  mg (has no administration in time range)  HYDROcodone-acetaminophen (NORCO/VICODIN) 5-325 MG per tablet 1 tablet (has no administration in time range)  atorvastatin (LIPITOR) tablet 80 mg (80 mg Oral Given 03/29/19 2157)  furosemide (LASIX) tablet 20 mg (20 mg Oral Given 03/30/19 0854)  losartan (COZAAR) tablet 50 mg (50 mg Oral Given 03/30/19 0853)  metoprolol tartrate (LOPRESSOR) tablet 25  mg (25 mg Oral Given 03/30/19 0853)  nitroGLYCERIN (NITROSTAT) SL tablet 0.4 mg (has no administration in time range)  LORazepam (ATIVAN) tablet 0.5 mg (has no administration in time range)  pantoprazole (PROTONIX) EC tablet 40 mg (40 mg Oral Given 03/29/19 2157)  sodium chloride flush (NS) 0.9 % injection 3 mL (3 mLs Intravenous Given 03/30/19 0854)  aspirin EC tablet 81 mg (81 mg Oral Given 03/30/19 0853)  acetaminophen (TYLENOL) tablet 650 mg (has no administration in time range)  ondansetron (ZOFRAN) injection 4 mg (has no administration in time range)  0.9 %  sodium chloride infusion ( Intravenous Rate/Dose Verify 03/30/19 0717)  nitroGLYCERIN 50 mg in dextrose 5 % 250 mL (0.2 mg/mL) infusion (6.667 mcg/min Intravenous Rate/Dose Change 03/30/19 1023)  zolpidem (AMBIEN) tablet 5 mg (has no administration in time range)  ALPRAZolam (XANAX) tablet 0.25 mg (has no administration in time range)  sodium chloride flush (NS) 0.9 % injection 3 mL (3 mLs Intravenous Given 03/29/19 1730)  acetaminophen (TYLENOL) tablet 1,000 mg (1,000 mg Oral Given 03/29/19 1756)  heparin bolus via infusion 4,000 Units (4,000 Units Intravenous Bolus from Bag 03/29/19 1813)  aspirin chewable tablet 324 mg (324 mg Oral Given 03/29/19 2158)    Or  aspirin suppository 300 mg ( Rectal See Alternative 03/29/19 2158)  potassium chloride SA (KLOR-CON) CR tablet 40 mEq (40 mEq Oral Given 03/30/19 1204)     Initial Impression / Assessment and Plan / ED Course  I have reviewed the triage vital signs and the nursing notes.  Pertinent labs & imaging results that were available during my care of the patient were reviewed by me and considered in my medical decision making (see chart for details).        On arrival pt is afebrile, HDS.  EKG: NSR, no obvious ischemic findings Initial troponin is negative CXR: No acute findings  Pt is overall well appearing with some improvement of chest pain after taking nitro prior to  arrival.   Concern for unstable angina in setting of known CAD awaiting CABG.   Discussed with cardiology who recommends heparin and nitro gtt and Cardiology admission. They have seen and evaluated the patient at bedside and have assumed care. No further acute event.   Final Clinical Impressions(s) / ED Diagnoses   Final diagnoses:  Unstable angina [facility])    ED Discharge Orders         Ordered    Procedural/ Surgical Case Request: CORONARY ARTERY BYPASS GRAFTING (CABG)     03/29/19 1623           [place] Spain, MD 03/30/19 [facility], [place], DO 03/31/19 [id number]

## 2019-03-29 NOTE — ED Notes (Signed)
Report given to rn on 3e 

## 2019-03-29 NOTE — ED Notes (Signed)
The pt reports that his pain is almost entirely gone

## 2019-03-29 NOTE — Progress Notes (Signed)
     [address]       [place] [address]             [phone]       78 yo male presents today for surgical evaluation of LM/3V CAD.  He states that he has been having angina at rest for the past 2-3 days, and almost call EMS the night before last for this complaint.  He is currently having angina now while sitting in the exam room.  I instructed his to take his nitroglycerin, while I arranged transport to the ER.  He will require admission, and medical optimization.  He has been on plavix, but this was discontinued on 11/17 flowing his LHC.  He will require surgical bypass, and is scheduled for 11/23.  Raylee Strehl Bary Leriche

## 2019-03-29 NOTE — ED Notes (Signed)
Cardiology at bedside.

## 2019-03-29 NOTE — ED Provider Notes (Signed)
Medical screening examination/treatment/procedure(s) were conducted as a shared visit with non-physician practitioner(s) and myself.  I personally evaluated the patient during the encounter. Briefly, the patient is a 78 y.o. male is a 78 year old male with history of CAD presents the ED with chest pain.  Was sent by cardiothoracic surgery for admission for unstable angina.  They plan to perform CABG on Monday.  Would like the patient to be on heparin and nitroglycerin drip.  Still having 7 out of 10 chest pain.  However EKG shows sinus rhythm with no ischemic changes.  Troponin is normal.  No significant anemia, electrolyte abnormality otherwise.  Cardiology to admit the patient.  We will start heparin and nitroglycerin drip.  Exam is overall unremarkable.  Clear breath sounds.  This chart was dictated using voice recognition software.  Despite best efforts to proofread,  errors can occur which can change the documentation meaning.    .Critical Care Performed by: Lennice Sites, DO Authorized by: Lennice Sites, DO   Critical care provider statement:    Critical care time (minutes):  35   Critical care was necessary to treat or prevent imminent or life-threatening deterioration of the following conditions:  Cardiac failure   Critical care was time spent personally by me on the following activities:  Blood draw for specimens, development of treatment plan with patient or surrogate, discussions with primary provider, evaluation of patient's response to treatment, examination of patient, obtaining history from patient or surrogate, ordering and performing treatments and interventions, ordering and review of laboratory studies, ordering and review of radiographic studies, pulse oximetry and re-evaluation of patient's condition   I assumed direction of critical care for this patient from another provider in my specialty: no        EKG Interpretation  Date/Time:  Friday March 29 2019 15:08:59  EST Ventricular Rate:  73 PR Interval:  156 QRS Duration: 90 QT Interval:  396 QTC Calculation: 436 R Axis:   53 Text Interpretation: Normal sinus rhythm Nonspecific ST abnormality Abnormal ECG No STEMI Confirmed by Octaviano Glow [phone]) on 03/29/2019 4:50:48 PM Also confirmed by Lennice Sites [phone])  on 03/29/2019 5:12:10 PM           Lennice Sites, DO 03/29/19 1808

## 2019-03-29 NOTE — ED Triage Notes (Signed)
Pt sent by cardiologists due to CP. Had heart cath Tuesday and setting up for surgery Monday. Pt has taken 2 nitro since leaving card office today. CP intermittently for 3 month with SOB

## 2019-03-30 ENCOUNTER — Inpatient Hospital Stay (HOSPITAL_COMMUNITY): Payer: Medicare HMO

## 2019-03-30 DIAGNOSIS — I25110 Atherosclerotic heart disease of native coronary artery with unstable angina pectoris: Secondary | ICD-10-CM

## 2019-03-30 DIAGNOSIS — R079 Chest pain, unspecified: Secondary | ICD-10-CM

## 2019-03-30 DIAGNOSIS — Z01810 Encounter for preprocedural cardiovascular examination: Secondary | ICD-10-CM

## 2019-03-30 LAB — HEMOGLOBIN A1C
Hgb A1c MFr Bld: 5.8 % — ABNORMAL HIGH (ref 4.8–5.6)
Mean Plasma Glucose: 119.76 mg/dL

## 2019-03-30 LAB — BASIC METABOLIC PANEL
Anion gap: 11 (ref 5–15)
BUN: 24 mg/dL — ABNORMAL HIGH (ref 8–23)
CO2: 23 mmol/L (ref 22–32)
Calcium: 9.2 mg/dL (ref 8.9–10.3)
Chloride: 105 mmol/L (ref 98–111)
Creatinine, Ser: 1.39 mg/dL — ABNORMAL HIGH (ref 0.61–1.24)
GFR calc Af Amer: 57 mL/min — ABNORMAL LOW (ref >60)
GFR calc non Af Amer: 49 mL/min — ABNORMAL LOW (ref >60)
Glucose, Bld: 123 mg/dL — ABNORMAL HIGH (ref 70–99)
Potassium: 3.5 mmol/L (ref 3.5–5.1)
Sodium: 139 mmol/L (ref 135–145)

## 2019-03-30 LAB — CBC
HCT: 40 % (ref 39.0–52.0)
Hemoglobin: 13.3 g/dL (ref 13.0–17.0)
MCH: 31.3 pg (ref 26.0–34.0)
MCHC: 33.3 g/dL (ref 30.0–36.0)
MCV: 94.1 fL (ref 80.0–100.0)
Platelets: 218 10*3/uL (ref 150–400)
RBC: 4.25 MIL/uL (ref 4.22–5.81)
RDW: 13 % (ref 11.5–15.5)
WBC: 6.9 10*3/uL (ref 4.0–10.5)
nRBC: 0 % (ref 0.0–0.2)

## 2019-03-30 LAB — LIPID PANEL
Cholesterol: 98 mg/dL (ref 0–200)
HDL: 29 mg/dL — ABNORMAL LOW (ref >40)
LDL Cholesterol: 34 mg/dL (ref 0–99)
Total CHOL/HDL Ratio: 3.4 RATIO
Triglycerides: 174 mg/dL — ABNORMAL HIGH (ref <150)
VLDL: 35 mg/dL (ref 0–40)

## 2019-03-30 LAB — ECHOCARDIOGRAM COMPLETE
Height: 67 in
Weight: 3627.2 oz

## 2019-03-30 LAB — HEPARIN LEVEL (UNFRACTIONATED)
Heparin Unfractionated: 0.25 IU/mL — ABNORMAL LOW (ref 0.30–0.70)
Heparin Unfractionated: 0.42 IU/mL (ref 0.30–0.70)
Heparin Unfractionated: 0.35 IU/mL (ref 0.30–0.70)

## 2019-03-30 MED ORDER — SODIUM CHLORIDE 0.9 % IV SOLN
750.0000 mg | INTRAVENOUS | Status: DC
  Filled 2019-03-30: qty 750

## 2019-03-30 MED ORDER — POTASSIUM CHLORIDE CRYS ER 20 MEQ PO TBCR
40.0000 meq | EXTENDED_RELEASE_TABLET | Freq: Once | ORAL | Status: AC
  Administered 2019-03-30: 40 meq via ORAL
  Filled 2019-03-30: qty 2

## 2019-03-30 MED ORDER — SODIUM CHLORIDE 0.9 % IV SOLN
1.5000 g | INTRAVENOUS | Status: DC
  Filled 2019-03-30: qty 1.5

## 2019-03-30 MED ORDER — NITROGLYCERIN IN D5W 200-5 MCG/ML-% IV SOLN
2.0000 ug/min | INTRAVENOUS | Status: DC
  Filled 2019-03-30: qty 250

## 2019-03-30 MED ORDER — POTASSIUM CHLORIDE 2 MEQ/ML IV SOLN
80.0000 meq | INTRAVENOUS | Status: DC
  Filled 2019-03-30: qty 40

## 2019-03-30 MED ORDER — TRANEXAMIC ACID (OHS) PUMP PRIME SOLUTION
2.0000 mg/kg | INTRAVENOUS | Status: DC
  Filled 2019-03-30: qty 2.06

## 2019-03-30 MED ORDER — EPINEPHRINE HCL 5 MG/250ML IV SOLN IN NS
0.0000 ug/min | INTRAVENOUS | Status: DC
  Filled 2019-03-30: qty 250

## 2019-03-30 MED ORDER — PLASMA-LYTE 148 IV SOLN
INTRAVENOUS | Status: DC
  Filled 2019-03-30: qty 2.5

## 2019-03-30 MED ORDER — MANNITOL 20 % IV SOLN
INTRAVENOUS | Status: DC
  Filled 2019-03-30: qty 13

## 2019-03-30 MED ORDER — TRANEXAMIC ACID (OHS) BOLUS VIA INFUSION
15.0000 mg/kg | INTRAVENOUS | Status: DC
  Filled 2019-03-30: qty 1542

## 2019-03-30 MED ORDER — MILRINONE LACTATE IN DEXTROSE 20-5 MG/100ML-% IV SOLN
0.3000 ug/kg/min | INTRAVENOUS | Status: DC
  Filled 2019-03-30: qty 100

## 2019-03-30 MED ORDER — DOPAMINE-DEXTROSE 3.2-5 MG/ML-% IV SOLN
0.0000 ug/kg/min | INTRAVENOUS | Status: DC
  Filled 2019-03-30: qty 250

## 2019-03-30 MED ORDER — TRANEXAMIC ACID 1000 MG/10ML IV SOLN
1.5000 mg/kg/h | INTRAVENOUS | Status: DC
  Filled 2019-03-30: qty 25

## 2019-03-30 MED ORDER — INSULIN REGULAR(HUMAN) IN NACL 100-0.9 UT/100ML-% IV SOLN
INTRAVENOUS | Status: DC
  Filled 2019-03-30: qty 100

## 2019-03-30 MED ORDER — DEXMEDETOMIDINE HCL IN NACL 400 MCG/100ML IV SOLN
0.1000 ug/kg/h | INTRAVENOUS | Status: DC
  Filled 2019-03-30: qty 100

## 2019-03-30 MED ORDER — VANCOMYCIN HCL 10 G IV SOLR
1500.0000 mg | INTRAVENOUS | Status: DC
  Filled 2019-03-30: qty 1500

## 2019-03-30 MED ORDER — SODIUM CHLORIDE 0.9 % IV SOLN
INTRAVENOUS | Status: DC
  Filled 2019-03-30: qty 30

## 2019-03-30 MED ORDER — PHENYLEPHRINE HCL-NACL 20-0.9 MG/250ML-% IV SOLN
30.0000 ug/min | INTRAVENOUS | Status: DC
  Filled 2019-03-30: qty 250

## 2019-03-30 NOTE — Progress Notes (Signed)
[facility] for heparin  Indication: chest pain/ACS  Allergies  Allergen Reactions  . Adhesive [Tape] Rash    Paper tape okay  . Latex Rash    Patient Measurements: Height: 5\' 7"  (170.2 cm) Weight: 222 lb 8 oz (100.9 kg) IBW/kg (Calculated) : 66.1 Heparin Dosing Weight: 89.1kg   Vital Signs: Temp: 97.7 F (36.5 C) (11/21 0039) Temp Source: Oral (11/21 0039) BP: 102/60 (11/21 0039) Pulse Rate: 59 (11/21 0039)  Labs: Recent Labs    03/29/19 1536 03/29/19 1710 03/29/19 1804 03/30/19 0212  HGB 14.5  --   --  13.3  HCT 43.1  --   --  40.0  PLT 265  --   --  218  APTT  --   --  36  --   LABPROT  --   --  13.0  --   INR  --   --  1.0  --   HEPARINUNFRC  --   --   --  0.25*  CREATININE 1.51*  --   --  1.39*  TROPONINIHS 4 6  --   --     Estimated Creatinine Clearance: 51.2 mL/min (A) (by C-G formula based on SCr of 1.39 mg/dL (H)).  Assessment: 78 y.o. male with chest pain, awaiting CABG, for heparin  Goal of Therapy:  Heparin level 0.3-0.7 units/ml Monitor platelets by anticoagulation protocol: Yes   Plan:  Increase Heparin 1350 units/hr Check heparin level in 8 hours.   Phillis Knack, PharmD, BCPS  03/30/2019,2:49 AM

## 2019-03-30 NOTE — Progress Notes (Signed)
Progress Note  Patient Name: Jake Dunn Date of Encounter: 03/30/2019  Primary Cardiologist: Peter Martinique, MD   Subjective   Mild chest pain this AM, improved significantly from admission.   Inpatient Medications    Scheduled Meds: . aspirin EC  81 mg Oral Daily  . atorvastatin  80 mg Oral QPM  . furosemide  20 mg Oral Daily  . losartan  50 mg Oral Daily  . metoprolol tartrate  25 mg Oral BID  . pantoprazole  40 mg Oral QPM  . sodium chloride flush  3 mL Intravenous Q12H   Continuous Infusions: . sodium chloride 10 mL/hr at 03/30/19 0717  . heparin 1,350 Units/hr (03/30/19 0717)  . nitroGLYCERIN 5 mcg/min (03/30/19 0717)   PRN Meds: acetaminophen, allopurinol, ALPRAZolam, HYDROcodone-acetaminophen, LORazepam, nitroGLYCERIN, ondansetron (ZOFRAN) IV, zolpidem   Vital Signs    Vitals:   03/29/19 2053 03/30/19 0039 03/30/19 0352 03/30/19 0857  BP: (!) 147/75 102/60 119/72 123/74  Pulse: (!) 59 (!) 59 (!) 58 60  Resp: 20 18 18 18   Temp: 98 F (36.7 C) 97.7 F (36.5 C) 98.1 F (36.7 C) 97.7 F (36.5 C)  TempSrc: Oral Oral Oral Oral  SpO2: 99% 96% 100% 100%  Weight: 100.9 kg  102.8 kg   Height: 5\' 7"  (1.702 m)       Intake/Output Summary (Last 24 hours) at 03/30/2019 1003 Last data filed at 03/30/2019 0844 Gross per 24 hour  Intake 1508.26 ml  Output 1700 ml  Net -191.74 ml   Last 3 Weights 03/30/2019 03/29/2019 03/29/2019  Weight (lbs) 226 lb 11.2 oz 222 lb 8 oz 230 lb  Weight (kg) 102.83 kg 100.925 kg 104.327 kg      Telemetry    SR - Personally Reviewed  ECG    n/a - Personally Reviewed  Physical Exam   GEN: No acute distress.   Neck: No JVD Cardiac: RRR, no murmurs, rubs, or gallops.  Respiratory: Clear to auscultation bilaterally. GI: Soft, nontender, non-distended  MS: No edema; No deformity. Neuro:  Nonfocal  Psych: Normal affect   Labs    High Sensitivity Troponin:   Recent Labs  Lab 03/29/19 1536 03/29/19 1710   TROPONINIHS 4 6      Chemistry Recent Labs  Lab 03/29/19 1536 03/29/19 2109 03/30/19 0212  NA 137  --  139  K 3.9  --  3.5  CL 106  --  105  CO2 20*  --  23  GLUCOSE 109*  --  123*  BUN 25*  --  24*  CREATININE 1.51*  --  1.39*  CALCIUM 9.2  --  9.2  PROT  --  7.1  --   ALBUMIN  --  3.9  --   AST  --  14*  --   ALT  --  15  --   ALKPHOS  --  83  --   BILITOT  --  0.7  --   GFRNONAA 44*  --  49*  GFRAA 51*  --  57*  ANIONGAP 11  --  11     Hematology Recent Labs  Lab 03/29/19 1536 03/30/19 0212  WBC 8.0 6.9  RBC 4.62 4.25  HGB 14.5 13.3  HCT 43.1 40.0  MCV 93.3 94.1  MCH 31.4 31.3  MCHC 33.6 33.3  RDW 12.9 13.0  PLT 265 218    BNP Recent Labs  Lab 03/29/19 2109  BNP 50.2     DDimer No results for input(s): DDIMER  in the last 168 hours.   Radiology    Dg Chest 2 View  Result Date: 03/29/2019 CLINICAL DATA:  Chest pain. Shortness of breath. EXAM: CHEST - 2 VIEW COMPARISON:  Chest x-ray dated 03/19/2019 FINDINGS: The heart size and pulmonary vascularity are. Aortic atherosclerosis. The lungs are clear. No significant bone abnormality. No effusions. IMPRESSION: 1. No active cardiopulmonary disease. 2.  Aortic Atherosclerosis (ICD10-I70.0). Electronically Signed   By: Lorriane Shire M.D.   On: 03/29/2019 15:38   Portable Chest X-ray 1 View  Result Date: 03/29/2019 CLINICAL DATA:  Chest pain at rest EXAM: PORTABLE CHEST 1 VIEW COMPARISON:  None. FINDINGS: No consolidation, features of edema, pneumothorax, or effusion. The aorta is calcified. The remaining cardiomediastinal contours are unremarkable. Prior reverse right shoulder arthroplasty. Cervical fixation hardware is noted as well. Degenerative changes are present in the imaged spine and left shoulder. No acute osseous or soft tissue abnormality. Surgical clips present in the left upper quadrant. IMPRESSION: No acute cardiopulmonary abnormality. Aortic Atherosclerosis (ICD10-I70.0). Electronically Signed    By: Lovena Le M.D.   On: 03/29/2019 21:30    Cardiac Studies    Patient Profile     Jake Dunn is a 78 y.o. male with increasing SOB and chest pain and known CAD, along with GERD, HTN, HL renal cell CA s/p left nephrectomy and cardiac cath for chest pain 03/26/19 with 3 vessel disease and recommendation for CABG.  Now admitted for crescendo angina while waiting for CABG.   Assessment & Plan    1. CAD/unstable angina - cath 11/17 with 3 vessel disease (end RPL 80%, LCX ostial 80%, ramus 90%, mid LAD 70%), awaiting CABG. Plavix on hold (had been on due to history of CVA). Actually was seeing Dr Kipp Brood CT surgery in clinic to discuss CABG and noted to be having progressing angina, sent to ER from clinic - trops negative - medical therapy with ASA 81, atorva 80, hep gtt, losartan 50 - echo pending  - will notify inpatient CT surgery team patient is admitted, preCABG workup over the weekend per there service.     For questions or updates, please contact [place] Please consult [email or web address] for contact info under        Signed, Carlyle Dolly, MD  03/30/2019, 10:03 AM

## 2019-03-30 NOTE — Progress Notes (Signed)
[address]       [place] [address]             [phone]                   Procedure(s) (LRB): CORONARY ARTERY BYPASS GRAFTING (CABG) (N/A)  LOS: 1 day   Subjective: Patient seen his wife is in the room with him, notes that he feels much better today than for the past several days after his cath.  Denies chest pain. Notes that he was glad that Dr. Kipp Brood sent him immediately for admission.  Objective: Vital signs in last 24 hours: Patient Vitals for the past 24 hrs:  BP Temp Temp src Pulse Resp SpO2 Height Weight  03/30/19 1137 118/79 97.9 F (36.6 C) Oral (!) 57 20 97 % - -  03/30/19 0857 123/74 97.7 F (36.5 C) Oral 60 18 100 % - -  03/30/19 0352 119/72 98.1 F (36.7 C) Oral (!) 58 18 100 % - 102.8 kg  03/30/19 0039 102/60 97.7 F (36.5 C) Oral (!) 59 18 96 % - -  03/29/19 2053 (!) 147/75 98 F (36.7 C) Oral (!) 59 20 99 % 5\' 7"  (1.702 m) 100.9 kg  03/29/19 2030 131/82 - - (!) 59 14 99 % - -  03/29/19 2015 116/77 - - (!) 59 17 96 % - -  03/29/19 1945 125/78 - - (!) 57 15 96 % - -  03/29/19 1915 124/80 - - 61 12 96 % - -  03/29/19 1911 123/80 - - 60 17 99 % - -  03/29/19 1905 125/72 - - 64 (!) 21 97 % - -  03/29/19 1900 106/83 - - (!) 58 14 96 % - -  03/29/19 1845 131/77 - - 60 - 97 % - -  03/29/19 1830 119/77 - - (!) 58 20 97 % - -  03/29/19 1815 119/83 - - (!) 58 12 97 % - -  03/29/19 1800 (!) 162/93 - - 61 16 98 % - -  03/29/19 1746 115/63 - - 61 14 97 % - -  03/29/19 1730 (!) 152/89 - - 62 13 98 % - -  03/29/19 1717 125/82 - - (!) 54 17 99 % - -  03/29/19 1507 - - - - - - 5\' 7"  (1.702 m) 104.3 kg  03/29/19 1506 137/79 98.3 F (36.8 C) Oral 70 20 99 % - -    Filed Weights   03/29/19 1507 03/29/19 2053 03/30/19 0352  Weight: 104.3 kg 100.9 kg 102.8 kg    Hemodynamic parameters for last 24 hours:    Intake/Output from previous day: 11/20 0701 - 11/21 0700 In: 1143.7 [P.O.:960; I.V.:183.7] Out: 875 [Urine:875] Intake/Output  this shift: Total I/O In: 364.6 [P.O.:240; I.V.:124.6] Out: 1675 [Urine:1675]  Scheduled Meds: . aspirin EC  81 mg Oral Daily  . atorvastatin  80 mg Oral QPM  . furosemide  20 mg Oral Daily  . losartan  50 mg Oral Daily  . metoprolol tartrate  25 mg Oral BID  . pantoprazole  40 mg Oral QPM  . sodium chloride flush  3 mL Intravenous Q12H   Continuous Infusions: . sodium chloride 10 mL/hr at 03/30/19 0717  . heparin 1,350 Units/hr (03/30/19 1009)  . nitroGLYCERIN 6.667 mcg/min (03/30/19 1023)   PRN Meds:.acetaminophen, allopurinol, ALPRAZolam, HYDROcodone-acetaminophen, LORazepam, nitroGLYCERIN, ondansetron (ZOFRAN) IV, zolpidem  General appearance: alert and cooperative Neurologic: intact Heart: regular rate and rhythm,  S1, S2 normal, no murmur, click, rub or gallop Lungs: clear to auscultation bilaterally Abdomen: soft, non-tender; bowel sounds normal; no masses,  no organomegaly Extremities: extremities normal, atraumatic, no cyanosis or edema  Lab Results: CBC: Recent Labs    03/29/19 1536 03/30/19 0212  WBC 8.0 6.9  HGB 14.5 13.3  HCT 43.1 40.0  PLT 265 218   BMET:  Recent Labs    03/29/19 1536 03/30/19 0212  NA 137 139  K 3.9 3.5  CL 106 105  CO2 20* 23  GLUCOSE 109* 123*  BUN 25* 24*  CREATININE 1.51* 1.39*  CALCIUM 9.2 9.2    PT/INR:  Recent Labs    03/29/19 1804  LABPROT 13.0  INR 1.0     Radiology Dg Chest 2 View  Result Date: 03/29/2019 CLINICAL DATA:  Chest pain. Shortness of breath. EXAM: CHEST - 2 VIEW COMPARISON:  Chest x-ray dated 03/19/2019 FINDINGS: The heart size and pulmonary vascularity are. Aortic atherosclerosis. The lungs are clear. No significant bone abnormality. No effusions. IMPRESSION: 1. No active cardiopulmonary disease. 2.  Aortic Atherosclerosis (ICD10-I70.0). Electronically Signed   By: Lorriane Shire M.D.   On: 03/29/2019 15:38   Portable Chest X-ray 1 View  Result Date: 03/29/2019 CLINICAL DATA:  Chest pain at  rest EXAM: PORTABLE CHEST 1 VIEW COMPARISON:  None. FINDINGS: No consolidation, features of edema, pneumothorax, or effusion. The aorta is calcified. The remaining cardiomediastinal contours are unremarkable. Prior reverse right shoulder arthroplasty. Cervical fixation hardware is noted as well. Degenerative changes are present in the imaged spine and left shoulder. No acute osseous or soft tissue abnormality. Surgical clips present in the left upper quadrant. IMPRESSION: No acute cardiopulmonary abnormality. Aortic Atherosclerosis (ICD10-I70.0). Electronically Signed   By: Lovena Le M.D.   On: 03/29/2019 21:30     Assessment/Plan: S/P Procedure(s) (LRB): CORONARY ARTERY BYPASS GRAFTING (CABG) (N/A) Tentative coronary artery bypass grafting by Dr. Kipp Brood on Monday-currently awaiting echocardiogram and preoperative Dopplers   Grace Isaac MD 03/30/2019 1:34 PM

## 2019-03-30 NOTE — Progress Notes (Signed)
Pre CABG       has been completed. Preliminary results can be found under CV proc through chart review. Wilhelmina Hark, BS, RDMS, RVT   

## 2019-03-30 NOTE — Progress Notes (Signed)
ANTICOAGULATION CONSULT NOTE  Pharmacy Consult for heparin  Indication: chest pain/ACS  Patient Measurements: Height: 5\' 7"  (170.2 cm) Weight: 226 lb 11.2 oz (102.8 kg) IBW/kg (Calculated) : 66.1 Heparin Dosing Weight: 89.1kg   Vital Signs: Temp: 97.7 F (36.5 C) (11/21 0857) Temp Source: Oral (11/21 0857) BP: 123/74 (11/21 0857) Pulse Rate: 60 (11/21 0857)  Labs: Recent Labs    03/29/19 1536 03/29/19 1710 03/29/19 1804 03/30/19 0212 03/30/19 0947  HGB 14.5  --   --  13.3  --   HCT 43.1  --   --  40.0  --   PLT 265  --   --  218  --   APTT  --   --  36  --   --   LABPROT  --   --  13.0  --   --   INR  --   --  1.0  --   --   HEPARINUNFRC  --   --   --  0.25* 0.42  CREATININE 1.51*  --   --  1.39*  --   TROPONINIHS 4 6  --   --   --     Estimated Creatinine Clearance: 51.7 mL/min (A) (by C-G formula based on SCr of 1.39 mg/dL (H)).  Assessment: 78 y.o. male with chest pain/UA, awaiting CABG 11/23, started on heparin. Clopidogrel held, on aspirin 81mg .   HL at goal. H/H and plt stable.   Goal of Therapy:  Heparin level 0.3-0.7 units/ml Monitor platelets by anticoagulation protocol: Yes   Plan:  -Continue Heparin 1350 units/hr -Confirmatory HL in 6 hr -Monitor daily HL, CBC, plt -Monitor for signs/symptoms of bleeding   Benetta Spar, PharmD, BCPS, BCCP Clinical Pharmacist  Please check AMION for all [place] phone numbers After 10:00 PM, call [place]

## 2019-03-30 NOTE — Progress Notes (Signed)
[place] for heparin  Indication: chest pain/ACS  Allergies  Allergen Reactions  . Adhesive [Tape] Rash    Paper tape okay  . Latex Rash    Patient Measurements: Height: 5\' 7"  ([id number] cm) Weight: 226 lb 11.2 oz (102.8 kg) IBW/kg (Calculated) : 66.1 Heparin Dosing Weight: 89.1kg   Vital Signs: Temp: 98.7 F (37.1 C) (11/21 1626) Temp Source: Oral (11/21 1626) BP: 112/67 (11/21 1626) Pulse Rate: 61 (11/21 1626)  Labs: Recent Labs    03/29/19 1536 03/29/19 1710 03/29/19 1804 03/30/19 0212 03/30/19 0947 03/30/19 1604  HGB 14.5  --   --  13.3  --   --   HCT 43.1  --   --  40.0  --   --   PLT 265  --   --  218  --   --   APTT  --   --  36  --   --   --   LABPROT  --   --  13.0  --   --   --   INR  --   --  1.0  --   --   --   HEPARINUNFRC  --   --   --  0.25* 0.42 0.35  CREATININE 1.51*  --   --  1.39*  --   --   TROPONINIHS 4 6  --   --   --   --     Estimated Creatinine Clearance: 51.7 mL/min (A) (by C-G formula based on SCr of 1.39 mg/dL (H)).  Assessment: 78 y.o. male with chest pain, awaiting CABG, continues on heparin Heparin level now therapeutic  Goal of Therapy:  Heparin level 0.3-0.7 units/ml Monitor platelets by anticoagulation protocol: Yes   Plan:  Continue heparin at 1350 units / hr Follow up AM labs  Thank you Anette Guarneri, PharmD  03/30/2019,4:53 PM

## 2019-03-30 NOTE — Progress Notes (Signed)
  Echocardiogram 2D Echocardiogram has been performed.  Johny Chess 03/30/2019, 8:58 AM

## 2019-03-31 ENCOUNTER — Other Ambulatory Visit: Payer: Self-pay

## 2019-03-31 LAB — CBC
HCT: 41.3 % (ref 39.0–52.0)
Hemoglobin: 13.7 g/dL (ref 13.0–17.0)
MCH: 31.4 pg (ref 26.0–34.0)
MCHC: 33.2 g/dL (ref 30.0–36.0)
MCV: 94.7 fL (ref 80.0–100.0)
Platelets: 207 10*3/uL (ref 150–400)
RBC: 4.36 MIL/uL (ref 4.22–5.81)
RDW: 12.9 % (ref 11.5–15.5)
WBC: 6.4 10*3/uL (ref 4.0–10.5)
nRBC: 0 % (ref 0.0–0.2)

## 2019-03-31 LAB — URINALYSIS, ROUTINE W REFLEX MICROSCOPIC
Bilirubin Urine: NEGATIVE
Glucose, UA: NEGATIVE mg/dL
Hgb urine dipstick: NEGATIVE
Ketones, ur: NEGATIVE mg/dL
Leukocytes,Ua: NEGATIVE
Nitrite: NEGATIVE
Protein, ur: NEGATIVE mg/dL
Specific Gravity, Urine: 1.01 (ref 1.005–1.030)
pH: 5 (ref 5.0–8.0)

## 2019-03-31 LAB — BLOOD GAS, ARTERIAL
Acid-base deficit: 3.1 mmol/L — ABNORMAL HIGH (ref 0.0–2.0)
Bicarbonate: 21.1 mmol/L (ref 20.0–28.0)
FIO2: 21
O2 Saturation: 96.6 %
Patient temperature: 36.7
pCO2 arterial: 35.1 mmHg (ref 32.0–48.0)
pH, Arterial: 7.394 (ref 7.350–7.450)
pO2, Arterial: 90.5 mmHg (ref 83.0–108.0)

## 2019-03-31 LAB — MRSA PCR SCREENING: MRSA by PCR: NEGATIVE

## 2019-03-31 LAB — HEPARIN LEVEL (UNFRACTIONATED): Heparin Unfractionated: 0.54 IU/mL (ref 0.30–0.70)

## 2019-03-31 LAB — PREPARE RBC (CROSSMATCH)

## 2019-03-31 MED ORDER — CHLORHEXIDINE GLUCONATE CLOTH 2 % EX PADS
6.0000 | MEDICATED_PAD | Freq: Once | CUTANEOUS | Status: AC
  Administered 2019-03-31: 6 via TOPICAL

## 2019-03-31 MED ORDER — CHLORHEXIDINE GLUCONATE 0.12 % MT SOLN
15.0000 mL | Freq: Once | OROMUCOSAL | Status: AC
  Administered 2019-04-01: 15 mL via OROMUCOSAL
  Filled 2019-03-31: qty 15

## 2019-03-31 MED ORDER — MORPHINE SULFATE (PF) 2 MG/ML IV SOLN
2.0000 mg | INTRAVENOUS | Status: DC | PRN
  Administered 2019-03-31: 2 mg via INTRAVENOUS
  Filled 2019-03-31: qty 1

## 2019-03-31 MED ORDER — BISACODYL 5 MG PO TBEC
5.0000 mg | DELAYED_RELEASE_TABLET | Freq: Once | ORAL | Status: DC
  Filled 2019-03-31: qty 1

## 2019-03-31 MED ORDER — METOPROLOL TARTRATE 12.5 MG HALF TABLET
12.5000 mg | ORAL_TABLET | Freq: Once | ORAL | Status: AC
  Administered 2019-04-01: 12.5 mg via ORAL
  Filled 2019-03-31: qty 1

## 2019-03-31 MED ORDER — TEMAZEPAM 15 MG PO CAPS: 15.0000 mg | ORAL_CAPSULE | Freq: Once | ORAL | Status: DC | PRN

## 2019-03-31 MED ORDER — CHLORHEXIDINE GLUCONATE CLOTH 2 % EX PADS
6.0000 | MEDICATED_PAD | Freq: Once | CUTANEOUS | Status: AC
  Administered 2019-04-01: 6 via TOPICAL

## 2019-03-31 NOTE — Progress Notes (Signed)
MD, pt takes colchicine 0.5 mg at home for gout may need med during admission, thanks [facility] [place].

## 2019-03-31 NOTE — Plan of Care (Signed)
  Problem: Nutrition: Goal: Adequate nutrition will be maintained Outcome: Completed/Met   Problem: Coping: Goal: Level of anxiety will decrease Outcome: Completed/Met   Problem: Elimination: Goal: Will not experience complications related to bowel motility Outcome: Completed/Met Goal: Will not experience complications related to urinary retention Outcome: Completed/Met   Problem: Safety: Goal: Ability to remain free from injury will improve Outcome: Completed/Met   Problem: Skin Integrity: Goal: Risk for impaired skin integrity will decrease Outcome: Completed/Met   

## 2019-03-31 NOTE — Progress Notes (Signed)
ANTICOAGULATION CONSULT NOTE  Pharmacy Consult for heparin  Indication: chest pain/ACS  Patient Measurements: Height: 5\' 7"  (170.2 cm) Weight: 227 lb 6.4 oz (103.1 kg) IBW/kg (Calculated) : 66.1 Heparin Dosing Weight: 89.1kg   Vital Signs: Temp: 98.2 F (36.8 C) (11/22 0814) Temp Source: Oral (11/22 0814) BP: 102/67 (11/22 0814) Pulse Rate: 60 (11/22 0814)  Labs: Recent Labs    03/29/19 1536 03/29/19 1710 03/29/19 1804  03/30/19 0212 03/30/19 0947 03/30/19 1604 03/31/19 0347  HGB 14.5  --   --   --  13.3  --   --  13.7  HCT 43.1  --   --   --  40.0  --   --  41.3  PLT 265  --   --   --  218  --   --  207  APTT  --   --  36  --   --   --   --   --   LABPROT  --   --  13.0  --   --   --   --   --   INR  --   --  1.0  --   --   --   --   --   HEPARINUNFRC  --   --   --    < > 0.25* 0.42 0.35 0.54  CREATININE 1.51*  --   --   --  1.39*  --   --   --   TROPONINIHS 4 6  --   --   --   --   --   --    < > = values in this interval not displayed.    Estimated Creatinine Clearance: 51.7 mL/min (A) (by C-G formula based on SCr of 1.39 mg/dL (H)).  Assessment: 78 y.o. male with chest pain/UA, awaiting CABG 11/23, started on heparin. Clopidogrel held, on aspirin 81mg .   HL at goal x3. H/H and plt stable.   Goal of Therapy:  Heparin level 0.3-0.7 units/ml Monitor platelets by anticoagulation protocol: Yes   Plan:  -Continue Heparin 1350 units/hr -Monitor daily HL, CBC, plt -Monitor for signs/symptoms of bleeding   Benetta Spar, PharmD, BCPS, BCCP Clinical Pharmacist  Please check AMION for all [place] phone numbers After 10:00 PM, call [place] [phone]

## 2019-03-31 NOTE — Progress Notes (Signed)
Progress Note  Patient Name: GRIEZMANN KULCZYK Date of Encounter: 03/31/2019  Primary Cardiologist: Peter Martinique, MD  Subjective   No chest pain, no SOB  Inpatient Medications    Scheduled Meds: . aspirin EC  81 mg Oral Daily  . atorvastatin  80 mg Oral QPM  . [START ON 04/01/2019] epinephrine  0-10 mcg/min Intravenous To OR  . furosemide  20 mg Oral Daily  . [START ON 04/01/2019] heparin-papaverine-plasmalyte irrigation   Irrigation To OR  . [START ON 04/01/2019] insulin   Intravenous To OR  . [START ON 04/01/2019] [facility] Blood Cardioplegia vial (lidocaine/magnesium/mannitol 0.26g-4g-6.4g)   Intracoronary To OR  . losartan  50 mg Oral Daily  . metoprolol tartrate  25 mg Oral BID  . pantoprazole  40 mg Oral QPM  . [START ON 04/01/2019] phenylephrine  30-200 mcg/min Intravenous To OR  . [START ON 04/01/2019] potassium chloride  80 mEq Other To OR  . sodium chloride flush  3 mL Intravenous Q12H  . [START ON 04/01/2019] tranexamic acid  15 mg/kg Intravenous To OR  . [START ON 04/01/2019] tranexamic acid  2 mg/kg Intracatheter To OR   Continuous Infusions: . sodium chloride 10 mL/hr at 03/30/19 0717  . [START ON 04/01/2019] cefUROXime (ZINACEF)  IV    . [START ON 04/01/2019] cefUROXime (ZINACEF)  IV    . [START ON 04/01/2019] dexmedetomidine    . [START ON 04/01/2019] DOPamine    . [START ON 04/01/2019] heparin 30,000 units/NS 1000 mL solution for CELLSAVER    . heparin 1,350 Units/hr (03/31/19 0520)  . [START ON 04/01/2019] milrinone    . nitroGLYCERIN 10 mcg/min (03/30/19 1023)  . [START ON 04/01/2019] nitroGLYCERIN    . [START ON 04/01/2019] tranexamic acid (CYKLOKAPRON) infusion ([facility])    . [START ON 04/01/2019] vancomycin     PRN Meds: acetaminophen, allopurinol, ALPRAZolam, HYDROcodone-acetaminophen, LORazepam, nitroGLYCERIN, ondansetron (ZOFRAN) IV, zolpidem   Vital Signs    Vitals:   03/30/19 2053 03/31/19 0011 03/31/19 0408 03/31/19 0814  BP: 112/68 106/64  114/72 102/67  Pulse:  (!) 57 60 60  Resp: 18 18 18 18   Temp: (!) 97.5 F (36.4 C) 97.7 F (36.5 C) 98.3 F (36.8 C) 98.2 F (36.8 C)  TempSrc: Oral Oral Oral Oral  SpO2: 98% 98% 97% 98%  Weight:   103.1 kg   Height:        Intake/Output Summary (Last 24 hours) at 03/31/2019 0927 Last data filed at 03/31/2019 0816 Gross per 24 hour  Intake 1150.5 ml  Output 2950 ml  Net -1799.5 ml   Last 3 Weights 03/31/2019 03/30/2019 03/29/2019  Weight (lbs) 227 lb 6.4 oz 226 lb 11.2 oz 222 lb 8 oz  Weight (kg) 103.148 kg 102.83 kg 100.925 kg      Telemetry    SR - Personally Reviewed  ECG    n/a- Personally Reviewed  Physical Exam   GEN: No acute distress.   Neck: No JVD Cardiac: RRR, no murmurs, rubs, or gallops.  Respiratory: Clear to auscultation bilaterally. GI: Soft, nontender, non-distended  MS: No edema; No deformity. Neuro:  Nonfocal  Psych: Normal affect   Labs    High Sensitivity Troponin:   Recent Labs  Lab 03/29/19 1536 03/29/19 1710  TROPONINIHS 4 6      Chemistry Recent Labs  Lab 03/29/19 1536 03/29/19 2109 03/30/19 0212  NA 137  --  139  K 3.9  --  3.5  CL 106  --  105  CO2  20*  --  23  GLUCOSE 109*  --  123*  BUN 25*  --  24*  CREATININE 1.51*  --  1.39*  CALCIUM 9.2  --  9.2  PROT  --  7.1  --   ALBUMIN  --  3.9  --   AST  --  14*  --   ALT  --  15  --   ALKPHOS  --  83  --   BILITOT  --  0.7  --   GFRNONAA 44*  --  49*  GFRAA 51*  --  57*  ANIONGAP 11  --  11     Hematology Recent Labs  Lab 03/29/19 1536 03/30/19 0212 03/31/19 0347  WBC 8.0 6.9 6.4  RBC 4.62 4.25 4.36  HGB 14.5 13.3 13.7  HCT 43.1 40.0 41.3  MCV 93.3 94.1 94.7  MCH 31.4 31.3 31.4  MCHC 33.6 33.3 33.2  RDW 12.9 13.0 12.9  PLT 265 218 207    BNP Recent Labs  Lab 03/29/19 2109  BNP 50.2     DDimer No results for input(s): DDIMER in the last 168 hours.   Radiology    Dg Chest 2 View  Result Date: 03/29/2019 CLINICAL DATA:  Chest pain.  Shortness of breath. EXAM: CHEST - 2 VIEW COMPARISON:  Chest x-ray dated 03/19/2019 FINDINGS: The heart size and pulmonary vascularity are. Aortic atherosclerosis. The lungs are clear. No significant bone abnormality. No effusions. IMPRESSION: 1. No active cardiopulmonary disease. 2.  Aortic Atherosclerosis (ICD10-I70.0). Electronically Signed   By: Lorriane Shire M.D.   On: 03/29/2019 15:38   Portable Chest X-ray 1 View  Result Date: 03/29/2019 CLINICAL DATA:  Chest pain at rest EXAM: PORTABLE CHEST 1 VIEW COMPARISON:  None. FINDINGS: No consolidation, features of edema, pneumothorax, or effusion. The aorta is calcified. The remaining cardiomediastinal contours are unremarkable. Prior reverse right shoulder arthroplasty. Cervical fixation hardware is noted as well. Degenerative changes are present in the imaged spine and left shoulder. No acute osseous or soft tissue abnormality. Surgical clips present in the left upper quadrant. IMPRESSION: No acute cardiopulmonary abnormality. Aortic Atherosclerosis (ICD10-I70.0). Electronically Signed   By: Lovena Le M.D.   On: 03/29/2019 21:30   Vas US Doppler Pre Cabg  Result Date: 03/30/2019 PREOPERATIVE VASCULAR EVALUATION  Indications:      Pre-CABG. Comparison Study: no prior Performing Technologist: June Leap RDMS, RVT  Examination Guidelines: A complete evaluation includes B-mode imaging, spectral Doppler, color Doppler, and power Doppler as needed of all accessible portions of each vessel. Bilateral testing is considered an integral part of a complete examination. Limited examinations for reoccurring indications may be performed as noted.  Right Carotid Findings: +----------+--------+--------+--------+--------+---------+           PSV cm/sEDV cm/sStenosisDescribeComments  +----------+--------+--------+--------+--------+---------+ CCA Prox  76      17                                +----------+--------+--------+--------+--------+---------+  CCA Distal47      10                                +----------+--------+--------+--------+--------+---------+ ICA Prox  90      26      1-39%   calcificShadowing +----------+--------+--------+--------+--------+---------+ ICA Distal67      19                                +----------+--------+--------+--------+--------+---------+  ECA       119     23              calcific          +----------+--------+--------+--------+--------+---------+  +----------+--------+-------+----------------+------------+           PSV cm/sEDV cmsDescribe        Arm Pressure +----------+--------+-------+----------------+------------+ Subclavian130            Multiphasic, WNL             +----------+--------+-------+----------------+------------+ +---------+--------+--+--------+--+---------+ VertebralPSV cm/s62EDV cm/s21Antegrade +---------+--------+--+--------+--+---------+ Left Carotid Findings: +----------+--------+--------+--------+------------+--------+           PSV cm/sEDV cm/sStenosisDescribe    Comments +----------+--------+--------+--------+------------+--------+ CCA Prox  67      16                                   +----------+--------+--------+--------+------------+--------+ CCA Distal57      17                                   +----------+--------+--------+--------+------------+--------+ ICA Prox  63      24      1-39%   heterogenous         +----------+--------+--------+--------+------------+--------+ ICA Distal86      24                                   +----------+--------+--------+--------+------------+--------+ ECA       86      15                                   +----------+--------+--------+--------+------------+--------+ +----------+--------+--------+----------------+------------+ SubclavianPSV cm/sEDV cm/sDescribe        Arm Pressure +----------+--------+--------+----------------+------------+           129              Multiphasic, [id number]          +----------+--------+--------+----------------+------------+ +---------+--------+--+--------+--+---------+ VertebralPSV cm/s50EDV cm/s14Antegrade +---------+--------+--+--------+--+---------+  Right Doppler Findings: +--------+--------+-----+---------+------------+ Site    PressureIndexDoppler  Comments     +--------+--------+-----+---------+------------+ Brachial             triphasicnot obtained +--------+--------+-----+---------+------------+ Radial               triphasic             +--------+--------+-----+---------+------------+ Ulnar                triphasic             +--------+--------+-----+---------+------------+  Left Doppler Findings: +--------+--------+-----+---------+--------+ Site    PressureIndexDoppler  Comments +--------+--------+-----+---------+--------+ [id number]          triphasic         +--------+--------+-----+---------+--------+ Radial               triphasic         +--------+--------+-----+---------+--------+ Ulnar                triphasic         +--------+--------+-----+---------+--------+  Summary: Right Carotid: Velocities in the right ICA are consistent with a 1-39% stenosis. Left Carotid: Velocities in the left ICA are consistent with a 1-39% stenosis. Right Upper Extremity: Doppler waveforms remain within normal limits with right radial compression. Doppler waveforms remain within  normal limits with right ulnar compression. Left Upper Extremity: Doppler waveform obliterate with left radial compression. Doppler waveforms remain within normal limits with left ulnar compression.     Preliminary     Cardiac Studies    Patient Profile     Aaronjames Enciso Brooksis a 78 y.o.malewith increasing SOB and chest pain and known CAD, along with GERD, HTN, HL renal cell CA s/p left nephrectomy and cardiac cath for chest pain 03/26/19 with 3 vessel disease and recommendation for CABG. Now admitted for  crescendo angina while waiting for CABG.   Assessment & Plan    1. CAD/unstable angina - cath 11/17 with 3 vessel disease (end RPL 80%, LCX ostial 80%, ramus 90%, mid LAD 70%), awaiting CABG. Plavix on hold (had been on due to history of CVA). Actually was seeing Dr Kipp Brood CT surgery in clinic to discuss CABG and noted to be having progressing angina, sent to ER from clinic - trops negative - echo LVEF 55-60%, mild RV dysfunction   - medical therapy with ASA 81, atorva 80, hep gtt, losartan 50, metop 25mg  bid, nitro gtt   - seen by CT surgery inpatient,  tenative plans for CABG on Monday. Preop workup per there team.    For questions or updates, please contact [place] Please consult [email or web address] for contact info under        Signed, Carlyle Dolly, MD  03/31/2019, 9:27 AM

## 2019-03-31 NOTE — Anesthesia Preprocedure Evaluation (Addendum)
Anesthesia Evaluation  Patient identified by MRN, date of birth, ID band Patient awake    Reviewed: Allergy & Precautions, H&P , NPO status , Patient's Chart, lab work & pertinent test results, reviewed documented beta blocker date and time   Airway Mallampati: I       Dental  (+) Teeth Intact, Dental Advisory Given, Edentulous Upper, Edentulous Lower   Pulmonary shortness of breath and with exertion, former smoker,    Pulmonary exam normal breath sounds clear to auscultation       Cardiovascular hypertension, + angina + CAD  Normal cardiovascular exam Rhythm:Regular Rate:Normal  Echo 03/30/2019 1. Left ventricular ejection fraction, by visual estimation, is 55 to 60%. The left ventricle has normal function. There is no left ventricular hypertrophy.  2. Global right ventricle has mildly reduced systolic function.The right ventricular size is mildly enlarged. No increase in right ventricular wall thickness.  3. Left atrial size was normal.  4. Right atrial size was mildly dilated.  5. The mitral valve is normal in structure. No evidence of mitral valve regurgitation.  6. The tricuspid valve is normal in structure. Tricuspid valve regurgitation is trivial.  7. The aortic valve is normal in structure. Aortic valve regurgitation is not visualized. No evidence of aortic valve sclerosis or stenosis.  8. The pulmonic valve was grossly normal. Pulmonic valve regurgitation is not visualized.  9. Aortic dilatation noted. 10. There is mild dilatation of the ascending aorta measuring 38 mm. 11. Normal pulmonary artery systolic pressure. 12. The atrial septum is grossly normal.   Neuro/Psych Anxiety  Neuromuscular disease CVA, No Residual Symptoms    GI/Hepatic GERD  Medicated and Controlled,  Endo/Other    Renal/GU Renal disease     Musculoskeletal  (+) Arthritis ,   Abdominal (+) + obese,   Peds  Hematology HLD   Anesthesia  Other Findings Nuclear stress  EF: 51%. Mildly a synchronous contraction. There was no ST segment deviation noted during stress. No perfusion defects at stress. This is a low risk study. No evidence of ischemia identified.   Candee Furbish, MD  The left ventricular wall motion is normal. Mild asynchronous contraction     Reproductive/Obstetrics                           Anesthesia Physical  Anesthesia Plan  ASA: IV  Anesthesia Plan: General   Post-op Pain Management:    Induction: Intravenous  PONV Risk Score and Plan: 3 and Ondansetron, Midazolam, Treatment may vary due to age or medical condition and Dexamethasone  Airway Management Planned: Oral ETT  Additional Equipment: Arterial line, CVP, PA Cath, TEE and Ultrasound Guidance Line Placement  Intra-op Plan:   Post-operative Plan: Post-operative intubation/ventilation  Informed Consent: I have reviewed the patients History and Physical, chart, labs and discussed the procedure including the risks, benefits and alternatives for the proposed anesthesia with the patient or authorized representative who has indicated his/her understanding and acceptance.       Plan Discussed with: CRNA  Anesthesia Plan Comments:        Anesthesia Quick Evaluation

## 2019-03-31 NOTE — Progress Notes (Signed)
  Pt states having increase in chest pain 4 out of 10 pain level. Currently on nitroglycerin drip at 3 ml/hr.   BP 102/67 HR 60.  NP paged to make aware. Will reassess.

## 2019-03-31 NOTE — Progress Notes (Signed)
Patient ID: Jake Dunn, male   DOB: May 04, 1941, 78 y.o.   MRN: [id number]      [facility]       [place] [address]             [phone]                   Procedure(s) (LRB): CORONARY ARTERY BYPASS GRAFTING (CABG) (N/A)  LOS: 2 days   Subjective: No chest pain now, early today had some increased discomfort, on heparin and ntg Objective: Vital signs in last 24 hours: Patient Vitals for the past 24 hrs:  BP Temp Temp src Pulse Resp SpO2 Weight  03/31/19 1131 118/73 98 F (36.7 C) Oral (!) 55 19 100 % -  03/31/19 1021 121/79 - - 61 - 94 % -  03/31/19 0814 102/67 98.2 F (36.8 C) Oral 60 18 98 % -  03/31/19 0408 114/72 98.3 F (36.8 C) Oral 60 18 97 % 103.1 kg  03/31/19 0011 106/64 97.7 F (36.5 C) Oral (!) 57 18 98 % -  03/30/19 2053 112/68 (!) 97.5 F (36.4 C) Oral - 18 98 % -  03/30/19 1626 112/67 98.7 F (37.1 C) Oral 61 18 97 % -    Filed Weights   03/29/19 2053 03/30/19 0352 03/31/19 0408  Weight: 100.9 kg 102.8 kg 103.1 kg    Hemodynamic parameters for last 24 hours:    Intake/Output from previous day: 11/21 0701 - 11/22 0700 In: 1275.1 [P.O.:820; I.V.:455.1] Out: [id number] [Urine:3375] Intake/Output this shift: Total I/O In: 240 [P.O.:240] Out: 650 [Urine:650]  Scheduled Meds: . aspirin EC  81 mg Oral Daily  . atorvastatin  80 mg Oral QPM  . [START ON 04/01/2019] epinephrine  0-10 mcg/min Intravenous To OR  . furosemide  20 mg Oral Daily  . [START ON 04/01/2019] heparin-papaverine-plasmalyte irrigation   Irrigation To OR  . [START ON 04/01/2019] insulin   Intravenous To OR  . [START ON 04/01/2019] [facility] Blood Cardioplegia vial (lidocaine/magnesium/mannitol 0.26g-4g-6.4g)   Intracoronary To OR  . losartan  50 mg Oral Daily  . metoprolol tartrate  25 mg Oral BID  . pantoprazole  40 mg Oral QPM  . [START ON 04/01/2019] phenylephrine  30-200 mcg/min Intravenous To OR  . [START ON 04/01/2019] potassium chloride  80 mEq Other To OR   . sodium chloride flush  3 mL Intravenous Q12H  . [START ON 04/01/2019] tranexamic acid  15 mg/kg Intravenous To OR  . [START ON 04/01/2019] tranexamic acid  2 mg/kg Intracatheter To OR   Continuous Infusions: . sodium chloride 10 mL/hr at 03/30/19 0717  . [START ON 04/01/2019] cefUROXime (ZINACEF)  IV    . [START ON 04/01/2019] cefUROXime (ZINACEF)  IV    . [START ON 04/01/2019] dexmedetomidine    . [START ON 04/01/2019] DOPamine    . [START ON 04/01/2019] heparin 30,000 units/NS 1000 mL solution for CELLSAVER    . heparin 1,350 Units/hr (03/31/19 0520)  . [START ON 04/01/2019] milrinone    . nitroGLYCERIN 10 mcg/min (03/30/19 1023)  . [START ON 04/01/2019] nitroGLYCERIN    . [START ON 04/01/2019] tranexamic acid (CYKLOKAPRON) infusion ([facility])    . [START ON 04/01/2019] vancomycin     PRN Meds:.acetaminophen, allopurinol, ALPRAZolam, HYDROcodone-acetaminophen, LORazepam, morphine injection, nitroGLYCERIN, ondansetron (ZOFRAN) IV, zolpidem  General appearance: alert and cooperative Neurologic: intact Heart: regular rate and rhythm, S1, S2 normal, no murmur, click, rub or gallop Lungs: clear to auscultation bilaterally  Lab  Results: CBC: Recent Labs    03/30/19 0212 03/31/19 0347  WBC 6.9 6.4  HGB 13.3 13.7  HCT 40.0 41.3  PLT 218 207   BMET:  Recent Labs    03/29/19 1536 03/30/19 0212  NA 137 139  K 3.9 3.5  CL 106 105  CO2 20* 23  GLUCOSE 109* 123*  BUN 25* 24*  CREATININE 1.51* 1.39*  CALCIUM 9.2 9.2    PT/INR:  Recent Labs    03/29/19 1804  LABPROT 13.0  INR 1.0     Radiology Dg Chest 2 View  Result Date: 03/29/2019 CLINICAL DATA:  Chest pain. Shortness of breath. EXAM: CHEST - 2 VIEW COMPARISON:  Chest x-ray dated 03/19/2019 FINDINGS: The heart size and pulmonary vascularity are. Aortic atherosclerosis. The lungs are clear. No significant bone abnormality. No effusions. IMPRESSION: 1. No active cardiopulmonary disease. 2.  Aortic Atherosclerosis  (ICD10-I70.0). Electronically Signed   By: Lorriane Shire M.D.   On: 03/29/2019 15:38   Portable Chest X-ray 1 View  Result Date: 03/29/2019 CLINICAL DATA:  Chest pain at rest EXAM: PORTABLE CHEST 1 VIEW COMPARISON:  None. FINDINGS: No consolidation, features of edema, pneumothorax, or effusion. The aorta is calcified. The remaining cardiomediastinal contours are unremarkable. Prior reverse right shoulder arthroplasty. Cervical fixation hardware is noted as well. Degenerative changes are present in the imaged spine and left shoulder. No acute osseous or soft tissue abnormality. Surgical clips present in the left upper quadrant. IMPRESSION: No acute cardiopulmonary abnormality. Aortic Atherosclerosis (ICD10-I70.0). Electronically Signed   By: Lovena Le M.D.   On: 03/29/2019 21:30   Vas US Doppler Pre Cabg  Result Date: 03/31/2019 PREOPERATIVE VASCULAR EVALUATION  Indications:      Pre-CABG. Comparison Study: no prior Performing Technologist: June Leap RDMS, RVT  Examination Guidelines: A complete evaluation includes B-mode imaging, spectral Doppler, color Doppler, and power Doppler as needed of all accessible portions of each vessel. Bilateral testing is considered an integral part of a complete examination. Limited examinations for reoccurring indications may be performed as noted.  Right Carotid Findings: +----------+--------+--------+--------+--------+---------+           PSV cm/sEDV cm/sStenosisDescribeComments  +----------+--------+--------+--------+--------+---------+ CCA Prox  76      17                                +----------+--------+--------+--------+--------+---------+ CCA Distal47      10                                +----------+--------+--------+--------+--------+---------+ ICA Prox  90      26      1-39%   calcificShadowing +----------+--------+--------+--------+--------+---------+ ICA Distal67      19                                 +----------+--------+--------+--------+--------+---------+ ECA       119     23              calcific          +----------+--------+--------+--------+--------+---------+ Portions of this table do not appear on this page. +----------+--------+-------+----------------+------------+           PSV cm/sEDV cmsDescribe        Arm Pressure +----------+--------+-------+----------------+------------+ Subclavian130            Multiphasic, WNL             +----------+--------+-------+----------------+------------+ +---------+--------+--+--------+--+---------+  VertebralPSV cm/s62EDV cm/s21Antegrade +---------+--------+--+--------+--+---------+ Left Carotid Findings: +----------+--------+--------+--------+------------+--------+           PSV cm/sEDV cm/sStenosisDescribe    Comments +----------+--------+--------+--------+------------+--------+ CCA Prox  67      16                                   +----------+--------+--------+--------+------------+--------+ CCA Distal57      17                                   +----------+--------+--------+--------+------------+--------+ ICA Prox  63      24      1-39%   heterogenous         +----------+--------+--------+--------+------------+--------+ ICA Distal86      24                                   +----------+--------+--------+--------+------------+--------+ ECA       86      15                                   +----------+--------+--------+--------+------------+--------+ +----------+--------+--------+----------------+------------+ SubclavianPSV cm/sEDV cm/sDescribe        Arm Pressure +----------+--------+--------+----------------+------------+           129             Multiphasic, [id number]          +----------+--------+--------+----------------+------------+ +---------+--------+--+--------+--+---------+ VertebralPSV cm/s50EDV cm/s14Antegrade +---------+--------+--+--------+--+---------+  Right  Doppler Findings: +--------+--------+-----+---------+------------+ Site    PressureIndexDoppler  Comments     +--------+--------+-----+---------+------------+ Brachial             triphasicnot obtained +--------+--------+-----+---------+------------+ Radial               triphasic             +--------+--------+-----+---------+------------+ Ulnar                triphasic             +--------+--------+-----+---------+------------+  Left Doppler Findings: +--------+--------+-----+---------+--------+ Site    PressureIndexDoppler  Comments +--------+--------+-----+---------+--------+ [id number]          triphasic         +--------+--------+-----+---------+--------+ Radial               triphasic         +--------+--------+-----+---------+--------+ Ulnar                triphasic         +--------+--------+-----+---------+--------+  Summary: Right Carotid: Velocities in the right ICA are consistent with a 1-39% stenosis. Left Carotid: Velocities in the left ICA are consistent with a 1-39% stenosis. ABI: Pedal artery waveforms within normal limits. Right Upper Extremity: Doppler waveforms remain within normal limits with right radial compression. Doppler waveforms remain within normal limits with right ulnar compression. Left Upper Extremity: Doppler waveform obliterate with left radial compression. Doppler waveforms remain within normal limits with left ulnar compression.  Electronically signed by Servando Snare MD on 03/31/2019 at 10:58:42 AM.    Final    Chronic Kidney Disease   Stage I     GFR >90  Stage II    GFR 60-89  Stage IIIA GFR 45-59  Stage IIIB GFR 30-44  Stage IV   GFR 15-29  Stage V    GFR  <15  Lab Results  Component Value Date   CREATININE 1.39 (H) 03/30/2019   Estimated Creatinine Clearance: 51.7 mL/min (A) (by C-G formula based on SCr of 1.39 mg/dL (H)).  Assessment/Plan: S/P Procedure(s) (LRB): CORONARY ARTERY BYPASS GRAFTING (CABG) (N/A)   for  bypass tomorrow by Dr Kipp Brood, Patient and wife had no further questions except about hospital visitation  Cr appears stable - chronic stage III ckd     Grace Isaac MD 03/31/2019 2:14 PM

## 2019-04-01 ENCOUNTER — Inpatient Hospital Stay (HOSPITAL_COMMUNITY): Payer: Medicare HMO

## 2019-04-01 ENCOUNTER — Encounter (HOSPITAL_COMMUNITY): Payer: Self-pay | Admitting: Certified Registered Nurse Anesthetist

## 2019-04-01 DIAGNOSIS — I480 Paroxysmal atrial fibrillation: Secondary | ICD-10-CM | POA: Diagnosis not present

## 2019-04-01 DIAGNOSIS — M170 Bilateral primary osteoarthritis of knee: Secondary | ICD-10-CM | POA: Diagnosis not present

## 2019-04-01 DIAGNOSIS — I2510 Atherosclerotic heart disease of native coronary artery without angina pectoris: Secondary | ICD-10-CM | POA: Diagnosis not present

## 2019-04-01 DIAGNOSIS — I25119 Atherosclerotic heart disease of native coronary artery with unspecified angina pectoris: Secondary | ICD-10-CM | POA: Diagnosis not present

## 2019-04-01 DIAGNOSIS — I25118 Atherosclerotic heart disease of native coronary artery with other forms of angina pectoris: Secondary | ICD-10-CM | POA: Diagnosis not present

## 2019-04-01 DIAGNOSIS — I10 Essential (primary) hypertension: Secondary | ICD-10-CM | POA: Diagnosis not present

## 2019-04-01 DIAGNOSIS — E785 Hyperlipidemia, unspecified: Secondary | ICD-10-CM | POA: Diagnosis not present

## 2019-04-01 LAB — SURGICAL PCR SCREEN
MRSA, PCR: NEGATIVE
Staphylococcus aureus: NEGATIVE

## 2019-04-01 LAB — COMPREHENSIVE METABOLIC PANEL
ALT: 15 U/L (ref 0–44)
AST: 12 U/L — ABNORMAL LOW (ref 15–41)
Albumin: 3.7 g/dL (ref 3.5–5.0)
Alkaline Phosphatase: 79 U/L (ref 38–126)
Anion gap: 9 (ref 5–15)
BUN: 18 mg/dL (ref 8–23)
CO2: 24 mmol/L (ref 22–32)
Calcium: 9.1 mg/dL (ref 8.9–10.3)
Chloride: 105 mmol/L (ref 98–111)
Creatinine, Ser: 1.31 mg/dL — ABNORMAL HIGH (ref 0.61–1.24)
GFR calc Af Amer: >60 mL/min (ref >60)
GFR calc non Af Amer: 53 mL/min — ABNORMAL LOW (ref >60)
Glucose, Bld: 101 mg/dL — ABNORMAL HIGH (ref 70–99)
Potassium: 3.7 mmol/L (ref 3.5–5.1)
Sodium: 138 mmol/L (ref 135–145)
Total Bilirubin: 0.5 mg/dL (ref 0.3–1.2)
Total Protein: 6.7 g/dL (ref 6.5–8.1)

## 2019-04-01 LAB — CBC
HCT: 41.1 % (ref 39.0–52.0)
Hemoglobin: 13.3 g/dL (ref 13.0–17.0)
MCH: 30.7 pg (ref 26.0–34.0)
MCHC: 32.4 g/dL (ref 30.0–36.0)
MCV: 94.9 fL (ref 80.0–100.0)
Platelets: 212 10*3/uL (ref 150–400)
RBC: 4.33 MIL/uL (ref 4.22–5.81)
RDW: 13 % (ref 11.5–15.5)
WBC: 6.8 10*3/uL (ref 4.0–10.5)
nRBC: 0 % (ref 0.0–0.2)

## 2019-04-01 LAB — HEPARIN LEVEL (UNFRACTIONATED): Heparin Unfractionated: 0.37 IU/mL (ref 0.30–0.70)

## 2019-04-01 MED ORDER — EPINEPHRINE HCL 5 MG/250ML IV SOLN IN NS
0.0000 ug/min | INTRAVENOUS | Status: DC
  Filled 2019-04-01: qty 250

## 2019-04-01 MED ORDER — DEXMEDETOMIDINE HCL IN NACL 400 MCG/100ML IV SOLN
0.1000 ug/kg/h | INTRAVENOUS | Status: DC
  Filled 2019-04-01: qty 100

## 2019-04-01 MED ORDER — MIDAZOLAM HCL 2 MG/2ML IJ SOLN
INTRAMUSCULAR | Status: AC
  Filled 2019-04-01: qty 2

## 2019-04-01 MED ORDER — INSULIN REGULAR(HUMAN) IN NACL 100-0.9 UT/100ML-% IV SOLN
INTRAVENOUS | Status: AC
  Administered 2019-04-02: 1.1 [IU]/h via INTRAVENOUS
  Filled 2019-04-01: qty 100

## 2019-04-01 MED ORDER — SODIUM CHLORIDE 0.9% FLUSH: 10.0000 mL | Freq: Two times a day (BID) | INTRAVENOUS | Status: DC

## 2019-04-01 MED ORDER — TRANEXAMIC ACID (OHS) PUMP PRIME SOLUTION
2.0000 mg/kg | INTRAVENOUS | Status: DC
  Filled 2019-04-01: qty 2.05

## 2019-04-01 MED ORDER — SODIUM CHLORIDE 0.9 % IV SOLN
INTRAVENOUS | Status: DC
  Filled 2019-04-01: qty 30

## 2019-04-01 MED ORDER — TRANEXAMIC ACID 1000 MG/10ML IV SOLN
1.5000 mg/kg/h | INTRAVENOUS | Status: DC
  Filled 2019-04-01: qty 25

## 2019-04-01 MED ORDER — LACTATED RINGERS IV SOLN
INTRAVENOUS | Status: DC
  Administered 2019-04-01: 10:00:00 via INTRAVENOUS

## 2019-04-01 MED ORDER — VANCOMYCIN HCL 1000 MG IV SOLR: INTRAVENOUS | Status: DC

## 2019-04-01 MED ORDER — PHENYLEPHRINE HCL-NACL 20-0.9 MG/250ML-% IV SOLN
30.0000 ug/min | INTRAVENOUS | Status: AC
  Administered 2019-04-02: 10 ug/min via INTRAVENOUS
  Filled 2019-04-01: qty 250

## 2019-04-01 MED ORDER — MIDAZOLAM HCL (PF) 10 MG/2ML IJ SOLN
INTRAMUSCULAR | Status: AC
  Filled 2019-04-01: qty 2

## 2019-04-01 MED ORDER — VANCOMYCIN HCL 10 G IV SOLR
1500.0000 mg | INTRAVENOUS | Status: AC
  Administered 2019-04-02: 1500 mg via INTRAVENOUS
  Filled 2019-04-01: qty 1500

## 2019-04-01 MED ORDER — MANNITOL 20 % IV SOLN
INTRAVENOUS | Status: DC
  Filled 2019-04-01: qty 13

## 2019-04-01 MED ORDER — PLASMA-LYTE 148 IV SOLN
INTRAVENOUS | Status: DC
  Filled 2019-04-01: qty 2.5

## 2019-04-01 MED ORDER — MIDAZOLAM HCL 2 MG/2ML IJ SOLN
INTRAMUSCULAR | Status: DC | PRN
  Administered 2019-04-01 (×2): 1 mg via INTRAVENOUS
  Administered 2019-04-02 (×3): 2 mg via INTRAVENOUS

## 2019-04-01 MED ORDER — POTASSIUM CHLORIDE 2 MEQ/ML IV SOLN
80.0000 meq | INTRAVENOUS | Status: DC
  Filled 2019-04-01: qty 40

## 2019-04-01 MED ORDER — PHENYLEPHRINE 40 MCG/ML (10ML) SYRINGE FOR IV PUSH (FOR BLOOD PRESSURE SUPPORT)
PREFILLED_SYRINGE | INTRAVENOUS | Status: AC
  Filled 2019-04-01: qty 30

## 2019-04-01 MED ORDER — TRANEXAMIC ACID (OHS) BOLUS VIA INFUSION
15.0000 mg/kg | INTRAVENOUS | Status: AC
  Administered 2019-04-02: 1540.5 mg via INTRAVENOUS
  Filled 2019-04-01: qty 1541

## 2019-04-01 MED ORDER — DOPAMINE-DEXTROSE 3.2-5 MG/ML-% IV SOLN
0.0000 ug/kg/min | INTRAVENOUS | Status: DC
  Filled 2019-04-01: qty 250

## 2019-04-01 MED ORDER — FENTANYL CITRATE (PF) 100 MCG/2ML IJ SOLN
INTRAMUSCULAR | Status: DC | PRN
  Administered 2019-04-01 (×2): 50 ug via INTRAVENOUS
  Administered 2019-04-02: 150 ug via INTRAVENOUS
  Administered 2019-04-02 (×3): 100 ug via INTRAVENOUS
  Administered 2019-04-02: 250 ug via INTRAVENOUS
  Administered 2019-04-02: 150 ug via INTRAVENOUS
  Administered 2019-04-02: 100 ug via INTRAVENOUS
  Administered 2019-04-02: 50 ug via INTRAVENOUS
  Administered 2019-04-02: 150 ug via INTRAVENOUS

## 2019-04-01 MED ORDER — SODIUM CHLORIDE 0.9 % IV SOLN
1.5000 g | INTRAVENOUS | Status: AC
  Administered 2019-04-02: 1.5 g via INTRAVENOUS
  Filled 2019-04-01: qty 1.5

## 2019-04-01 MED ORDER — MILRINONE LACTATE IN DEXTROSE 20-5 MG/100ML-% IV SOLN
0.3000 ug/kg/min | INTRAVENOUS | Status: DC
  Filled 2019-04-01: qty 100

## 2019-04-01 MED ORDER — FENTANYL CITRATE (PF) 100 MCG/2ML IJ SOLN
INTRAMUSCULAR | Status: AC
  Filled 2019-04-01: qty 2

## 2019-04-01 MED ORDER — FENTANYL CITRATE (PF) 250 MCG/5ML IJ SOLN
INTRAMUSCULAR | Status: AC
  Filled 2019-04-01: qty 25

## 2019-04-01 MED ORDER — ROCURONIUM BROMIDE 10 MG/ML (PF) SYRINGE
PREFILLED_SYRINGE | INTRAVENOUS | Status: AC
  Filled 2019-04-01: qty 30

## 2019-04-01 MED ORDER — SODIUM CHLORIDE 0.9 % IV SOLN
750.0000 mg | INTRAVENOUS | Status: DC
  Filled 2019-04-01: qty 750

## 2019-04-01 MED ORDER — CHLORHEXIDINE GLUCONATE CLOTH 2 % EX PADS: 6.0000 | MEDICATED_PAD | Freq: Every day | CUTANEOUS | Status: DC

## 2019-04-01 MED ORDER — PROTAMINE SULFATE 10 MG/ML IV SOLN
INTRAVENOUS | Status: AC
  Filled 2019-04-01: qty 5

## 2019-04-01 MED ORDER — PROPOFOL 10 MG/ML IV BOLUS
INTRAVENOUS | Status: AC
  Filled 2019-04-01: qty 20

## 2019-04-01 MED ORDER — SODIUM CHLORIDE 0.9% FLUSH: 10.0000 mL | INTRAVENOUS | Status: DC | PRN

## 2019-04-01 MED ORDER — EPHEDRINE SULFATE-NACL 50-0.9 MG/10ML-% IV SOSY
PREFILLED_SYRINGE | INTRAVENOUS | Status: DC | PRN
  Administered 2019-04-01: 5 mg via INTRAVENOUS
  Administered 2019-04-01: 10 mg via INTRAVENOUS

## 2019-04-01 MED ORDER — NITROGLYCERIN IN D5W 200-5 MCG/ML-% IV SOLN
2.0000 ug/min | INTRAVENOUS | Status: AC
  Administered 2019-04-02: 10 ug/min via INTRAVENOUS
  Filled 2019-04-01: qty 250

## 2019-04-01 NOTE — Anesthesia Procedure Notes (Signed)
Arterial Line Insertion Start/End11/23/2020 10:30 AM, 04/01/2019 10:40 AM Performed by: Lavell Luster, CRNA  Patient location: Pre-op. Preanesthetic checklist: patient identified, IV checked, site marked, risks and benefits discussed, surgical consent, monitors and equipment checked, pre-op evaluation and timeout performed Lidocaine 1% used for infiltration Left, radial was placed Catheter size: 20 G Hand hygiene performed , maximum sterile barriers used  and Seldinger technique used Allen's test indicative of satisfactory collateral circulation Attempts: 1 Procedure performed without using ultrasound guided technique. Following insertion, Biopatch and dressing applied. Post procedure assessment: normal  Patient tolerated the procedure well with no immediate complications. Additional procedure comments: Pt c/o dizziness and diaphoretic after arterial line placement. . HR 123XX123, BP 84 systolic.  Ephedrine administered with improvement in VS.  Dr Lanetta Inch and Dr Lissa Hoard at bedside.    Henderson Cloud, CRNA.

## 2019-04-01 NOTE — Progress Notes (Addendum)
Progress Note  Patient Name: Jake Dunn Date of Encounter: 04/01/2019  Primary Cardiologist: Peter Martinique, MD  Subjective   No chest pain overnight. Planned for surgery this morning 11am case.   Inpatient Medications    Scheduled Meds: . aspirin EC  81 mg Oral Daily  . atorvastatin  80 mg Oral QPM  . bisacodyl  5 mg Oral Once  . epinephrine  0-10 mcg/min Intravenous To OR  . furosemide  20 mg Oral Daily  . heparin-papaverine-plasmalyte irrigation   Irrigation To OR  . insulin   Intravenous To OR  . [place] Blood Cardioplegia vial (lidocaine/magnesium/mannitol 0.26g-4g-6.4g)   Intracoronary To OR  . losartan  50 mg Oral Daily  . metoprolol tartrate  25 mg Oral BID  . pantoprazole  40 mg Oral QPM  . phenylephrine  30-200 mcg/min Intravenous To OR  . potassium chloride  80 mEq Other To OR  . sodium chloride flush  3 mL Intravenous Q12H  . tranexamic acid  15 mg/kg Intravenous To OR  . tranexamic acid  2 mg/kg Intracatheter To OR   Continuous Infusions: . sodium chloride 10 mL/hr at 03/30/19 0717  . cefUROXime (ZINACEF)  IV    . cefUROXime (ZINACEF)  IV    . dexmedetomidine    . DOPamine    . heparin 30,000 units/NS 1000 mL solution for CELLSAVER    . heparin 1,350 Units/hr (04/01/19 0049)  . milrinone    . nitroGLYCERIN 10 mcg/min (03/30/19 1023)  . nitroGLYCERIN    . tranexamic acid (CYKLOKAPRON) infusion ([facility])    . vancomycin     PRN Meds: acetaminophen, allopurinol, ALPRAZolam, HYDROcodone-acetaminophen, LORazepam, morphine injection, nitroGLYCERIN, ondansetron (ZOFRAN) IV, temazepam, zolpidem   Vital Signs    Vitals:   04/01/19 0200 04/01/19 0409 04/01/19 0500 04/01/19 0814  BP: 116/75 122/77  140/72  Pulse: 67 (!) 57  60  Resp: 18 16  16   Temp: 97.9 F (36.6 C) 98.4 F (36.9 C)  97.8 F (36.6 C)  TempSrc: Oral Oral  Oral  SpO2: 97% 99%  96%  Weight:   102.7 kg   Height:        Intake/Output Summary (Last 24 hours) at 04/01/2019 0832 Last  data filed at 04/01/2019 0830 Gross per 24 hour  Intake 904.91 ml  Output 3000 ml  Net -2095.09 ml   Last 3 Weights 04/01/2019 03/31/2019 03/30/2019  Weight (lbs) 226 lb 8 oz 227 lb 6.4 oz 226 lb 11.2 oz  Weight (kg) 102.74 kg 103.148 kg 102.83 kg      Telemetry    SR - Personally Reviewed  ECG    No new tracing this morning.  Physical Exam  Pleasant older WM GEN: No acute distress.   Neck: No JVD Cardiac: RRR, no murmurs, rubs, or gallops.  Respiratory: Clear to auscultation bilaterally. GI: Soft, nontender, non-distended  MS: No edema; No deformity. Neuro:  Nonfocal  Psych: Normal affect   Labs    High Sensitivity Troponin:   Recent Labs  Lab 03/29/19 1536 03/29/19 1710  TROPONINIHS 4 6      Chemistry Recent Labs  Lab 03/29/19 1536 03/29/19 2109 03/30/19 0212 04/01/19 0455  NA 137  --  139 138  K 3.9  --  3.5 3.7  CL 106  --  105 105  CO2 20*  --  23 24  GLUCOSE 109*  --  123* 101*  BUN 25*  --  24* 18  CREATININE 1.51*  --  1.39* 1.31*  CALCIUM 9.2  --  9.2 9.1  PROT  --  7.1  --  6.7  ALBUMIN  --  3.9  --  3.7  AST  --  14*  --  12*  ALT  --  15  --  15  ALKPHOS  --  83  --  79  BILITOT  --  0.7  --  0.5  GFRNONAA 44*  --  49* 53*  GFRAA 51*  --  57* >60  ANIONGAP 11  --  11 9     Hematology Recent Labs  Lab 03/30/19 0212 03/31/19 0347 04/01/19 0455  WBC 6.9 6.4 6.8  RBC 4.25 4.36 4.33  HGB 13.3 13.7 13.3  HCT 40.0 41.3 41.1  MCV 94.1 94.7 94.9  MCH 31.3 31.4 30.7  MCHC 33.3 33.2 32.4  RDW 13.0 12.9 13.0  PLT 218 207 212    BNP Recent Labs  Lab 03/29/19 2109  BNP 50.2     DDimer No results for input(s): DDIMER in the last 168 hours.   Radiology    Vas US Doppler Pre Cabg  Result Date: 03/31/2019 PREOPERATIVE VASCULAR EVALUATION  Indications:      Pre-CABG. Comparison Study: no prior Performing Technologist: June Leap RDMS, RVT  Examination Guidelines: A complete evaluation includes B-mode imaging, spectral Doppler,  color Doppler, and power Doppler as needed of all accessible portions of each vessel. Bilateral testing is considered an integral part of a complete examination. Limited examinations for reoccurring indications may be performed as noted.  Right Carotid Findings: +----------+--------+--------+--------+--------+---------+           PSV cm/sEDV cm/sStenosisDescribeComments  +----------+--------+--------+--------+--------+---------+ CCA Prox  76      17                                +----------+--------+--------+--------+--------+---------+ CCA Distal47      10                                +----------+--------+--------+--------+--------+---------+ ICA Prox  90      26      1-39%   calcificShadowing +----------+--------+--------+--------+--------+---------+ ICA Distal67      19                                +----------+--------+--------+--------+--------+---------+ ECA       119     23              calcific          +----------+--------+--------+--------+--------+---------+ Portions of this table do not appear on this page. +----------+--------+-------+----------------+------------+           PSV cm/sEDV cmsDescribe        Arm Pressure +----------+--------+-------+----------------+------------+ Subclavian130            Multiphasic, WNL             +----------+--------+-------+----------------+------------+ +---------+--------+--+--------+--+---------+ VertebralPSV cm/s62EDV cm/s21Antegrade +---------+--------+--+--------+--+---------+ Left Carotid Findings: +----------+--------+--------+--------+------------+--------+           PSV cm/sEDV cm/sStenosisDescribe    Comments +----------+--------+--------+--------+------------+--------+ CCA Prox  67      16                                   +----------+--------+--------+--------+------------+--------+ CCA Distal57  17                                    +----------+--------+--------+--------+------------+--------+ ICA Prox  63      24      1-39%   heterogenous         +----------+--------+--------+--------+------------+--------+ ICA Distal86      24                                   +----------+--------+--------+--------+------------+--------+ ECA       86      15                                   +----------+--------+--------+--------+------------+--------+ +----------+--------+--------+----------------+------------+ SubclavianPSV cm/sEDV cm/sDescribe        Arm Pressure +----------+--------+--------+----------------+------------+           129             Multiphasic, [id number]          +----------+--------+--------+----------------+------------+ +---------+--------+--+--------+--+---------+ VertebralPSV cm/s50EDV cm/s14Antegrade +---------+--------+--+--------+--+---------+  Right Doppler Findings: +--------+--------+-----+---------+------------+ Site    PressureIndexDoppler  Comments     +--------+--------+-----+---------+------------+ Brachial             triphasicnot obtained +--------+--------+-----+---------+------------+ Radial               triphasic             +--------+--------+-----+---------+------------+ Ulnar                triphasic             +--------+--------+-----+---------+------------+  Left Doppler Findings: +--------+--------+-----+---------+--------+ Site    PressureIndexDoppler  Comments +--------+--------+-----+---------+--------+ [id number]          triphasic         +--------+--------+-----+---------+--------+ Radial               triphasic         +--------+--------+-----+---------+--------+ Ulnar                triphasic         +--------+--------+-----+---------+--------+  Summary: Right Carotid: Velocities in the right ICA are consistent with a 1-39% stenosis. Left Carotid: Velocities in the left ICA are consistent with a 1-39% stenosis. ABI:  Pedal artery waveforms within normal limits. Right Upper Extremity: Doppler waveforms remain within normal limits with right radial compression. Doppler waveforms remain within normal limits with right ulnar compression. Left Upper Extremity: Doppler waveform obliterate with left radial compression. Doppler waveforms remain within normal limits with left ulnar compression.  Electronically signed by Servando Snare MD on 03/31/2019 at 10:58:42 AM.    Final     Cardiac Studies   Cath: 03/26/19   Prox RCA lesion is 45% stenosed.  2nd RPL lesion is 80% stenosed.  Ost LM to Mid LM lesion is 50% stenosed.  Ost Cx to Mid Cx lesion is 80% stenosed.  Ramus lesion is 90% stenosed.  Mid LAD lesion is 70% stenosed.  2nd Diag lesion is 60% stenosed.  The left ventricular systolic function is normal.  LV end diastolic pressure is normal.  The left ventricular ejection fraction is 55-65% by visual estimate.   1. Three vessel obstructive, Calcific CAD 2. Normal LV function 3. Normal LVEDP  Plan: recommend referral to CT surgery for CABG. Will stop Plavix.  Diagnostic Dominance: Right  TTE: 03/30/19  IMPRESSIONS    1. Left ventricular ejection fraction, by visual estimation, is 55 to 60%. The left ventricle has normal function. There is no left ventricular hypertrophy.  2. Global right ventricle has mildly reduced systolic function.The right ventricular size is mildly enlarged. No increase in right ventricular wall thickness.  3. Left atrial size was normal.  4. Right atrial size was mildly dilated.  5. The mitral valve is normal in structure. No evidence of mitral valve regurgitation.  6. The tricuspid valve is normal in structure. Tricuspid valve regurgitation is trivial.  7. The aortic valve is normal in structure. Aortic valve regurgitation is not visualized. No evidence of aortic valve sclerosis or stenosis.  8. The pulmonic valve was grossly normal. Pulmonic valve regurgitation  is not visualized.  9. Aortic dilatation noted. 10. There is mild dilatation of the ascending aorta measuring 38 mm. 11. Normal pulmonary artery systolic pressure. 12. The atrial septum is grossly normal.  Patient Profile     78 y.o. male with increasing SOB and chest pain and known CAD, along with GERD, HTN, HL renal cell CA s/p left nephrectomy and cardiac cath for chest pain 03/26/19 with 3 vessel disease and recommendation for CABG.  Now admitted for crescendo angina while waiting for CABG.   Assessment & Plan    1. CAD/Unstable Angina: Underwent cardiac cath with Dr. Martinique with 3v disease on 11/17. Set up for outpatient CABG evaluation but presented to the [place] office on Friday with chest pain. Admitted with plans for CABG today. EF 55-60% with mild RV dysfunction.  -- remains on IV heparin, nitro gtt, ASA, statin, BB  2. HTN: stable with current therapy  3. HL: on high dose statin.  For questions or updates, please contact [place] Please consult [email or web address] for contact info under        Signed, Reino Bellis, NP  04/01/2019, 8:32 AM    Mr. Mendez was seen this am by Reino Bellis, NP. I did not see him before he went to the OR for CABG. Will follow up tomorrow.   Lauree Chandler 04/01/2019 9:37 AM

## 2019-04-01 NOTE — Progress Notes (Signed)
     [address]       [place] [address]             [phone]       No events.  Feels much better.  Vitals:   04/01/19 0200 04/01/19 0409  BP: 116/75 122/77  Pulse: 67 (!) 57  Resp: 18 16  Temp: 97.9 F (36.6 C) 98.4 F (36.9 C)  SpO2: 97% 99%   Alert NAD RRR EWOB  78 yo male with LM/3V CAD OR today for CABG

## 2019-04-01 NOTE — Anesthesia Procedure Notes (Signed)
Central Venous Catheter Insertion Performed by: Nolon Nations, MD, anesthesiologist Patient location: Pre-op. Preanesthetic checklist: patient identified, IV checked, site marked, risks and benefits discussed, surgical consent, monitors and equipment checked, pre-op evaluation, timeout performed and anesthesia consent Position: Trendelenburg Lidocaine 1% used for infiltration and patient sedated Hand hygiene performed  and maximum sterile barriers used  Catheter size: 8.5 Fr Sheath introducer PA Cath depth:42 Procedure performed using ultrasound guided technique. Ultrasound Notes:anatomy identified, needle tip was noted to be adjacent to the nerve/plexus identified, no ultrasound evidence of intravascular and/or intraneural injection and image(s) printed for medical record Attempts: 1 Following insertion, line sutured, dressing applied and Biopatch. Post procedure assessment: blood return through all ports, free fluid flow and no air  Patient tolerated the procedure well with no immediate complications.

## 2019-04-01 NOTE — Anesthesia Procedure Notes (Signed)
Central Venous Catheter Insertion Performed by: Nolon Nations, MD, anesthesiologist Patient location: Pre-op. Preanesthetic checklist: patient identified, IV checked, site marked, risks and benefits discussed, surgical consent, monitors and equipment checked, pre-op evaluation, timeout performed and anesthesia consent Hand hygiene performed  and maximum sterile barriers used  PA cath was placed.Swan type:thermodilution Procedure performed without using ultrasound guided technique. Attempts: 1 Patient tolerated the procedure well with no immediate complications.

## 2019-04-01 NOTE — Progress Notes (Signed)
  Spoke with NP regarding pt's morning medications and advice on medications to hold and give.  Metoprolol 12.5 mg  Was given at 0511. Will hold metoprolol 25 mg, and lasix 20 mg.

## 2019-04-01 NOTE — Progress Notes (Signed)
Patient was transported in bed at room air for procedure. Report given to short stay [phone].  Patient's dentures in room and labeled to be picked up by pt's spouse. Nurse accompanied pt. Heparin drip was stopped.  Pre-procedure checklist completed.

## 2019-04-01 NOTE — Progress Notes (Signed)
ANTICOAGULATION CONSULT NOTE  Pharmacy Consult for heparin  Indication: chest pain/ACS  Patient Measurements: Height: 5\' 7"  (170.2 cm) Weight: 226 lb 8 oz (102.7 kg) IBW/kg (Calculated) : 66.1 Heparin Dosing Weight: 89.1kg   Vital Signs: Temp: 97.8 F (36.6 C) (11/23 0814) Temp Source: Oral (11/23 0814) BP: 140/72 (11/23 0814) Pulse Rate: 60 (11/23 0814)  Labs: Recent Labs    03/29/19 1536 03/29/19 1710 03/29/19 1804 03/30/19 0212  03/30/19 1604 03/31/19 0347 04/01/19 0455  HGB 14.5  --   --  13.3  --   --  13.7 13.3  HCT 43.1  --   --  40.0  --   --  41.3 41.1  PLT 265  --   --  218  --   --  207 212  APTT  --   --  36  --   --   --   --   --   LABPROT  --   --  13.0  --   --   --   --   --   INR  --   --  1.0  --   --   --   --   --   HEPARINUNFRC  --   --   --  0.25*   < > 0.35 0.54 0.37  CREATININE 1.51*  --   --  1.39*  --   --   --  1.31*  TROPONINIHS 4 6  --   --   --   --   --   --    < > = values in this interval not displayed.    Estimated Creatinine Clearance: 54.8 mL/min (A) (by C-G formula based on SCr of 1.31 mg/dL (H)).  Assessment: 78 y.o. male with chest pain/UA, awaiting CABG 11/23, started on heparin. Clopidogrel held, on aspirin 81mg .   Heparin remains therapeutic, CABG later this morning.  Goal of Therapy:  Heparin level 0.3-0.7 units/ml Monitor platelets by anticoagulation protocol: Yes   Plan:  -Continue Heparin 1350 units/hr -Stop on-call to [place], PharmD, BCPS Clinical Pharmacist [phone] Please check AMION for all [facility] Pharmacy numbers 04/01/2019

## 2019-04-02 ENCOUNTER — Inpatient Hospital Stay (HOSPITAL_COMMUNITY): Payer: Medicare HMO

## 2019-04-02 ENCOUNTER — Inpatient Hospital Stay (HOSPITAL_COMMUNITY): Payer: Medicare HMO | Admitting: Anesthesiology

## 2019-04-02 ENCOUNTER — Encounter (HOSPITAL_COMMUNITY): Payer: Self-pay | Admitting: Anesthesiology

## 2019-04-02 ENCOUNTER — Encounter (HOSPITAL_COMMUNITY)
Admission: EM | Disposition: A | Discharge status: Home / Self Care | Payer: Self-pay | Source: Home / Self Care | Attending: Thoracic Surgery (Cardiothoracic Vascular Surgery)

## 2019-04-02 DIAGNOSIS — Z951 Presence of aortocoronary bypass graft: Secondary | ICD-10-CM

## 2019-04-02 HISTORY — PX: CORONARY ARTERY BYPASS GRAFT: SHX141

## 2019-04-02 LAB — POCT I-STAT, CHEM 8
BUN: 16 mg/dL (ref 8–23)
BUN: 15 mg/dL (ref 8–23)
BUN: 15 mg/dL (ref 8–23)
BUN: 16 mg/dL (ref 8–23)
BUN: 15 mg/dL (ref 8–23)
Calcium, Ion: 1.25 mmol/L (ref 1.15–1.40)
Calcium, Ion: 1.28 mmol/L (ref 1.15–1.40)
Calcium, Ion: 1.11 mmol/L — ABNORMAL LOW (ref 1.15–1.40)
Calcium, Ion: 1.12 mmol/L — ABNORMAL LOW (ref 1.15–1.40)
Calcium, Ion: 1.32 mmol/L (ref 1.15–1.40)
Chloride: 106 mmol/L (ref 98–111)
Chloride: 106 mmol/L (ref 98–111)
Chloride: 101 mmol/L (ref 98–111)
Chloride: 103 mmol/L (ref 98–111)
Chloride: 105 mmol/L (ref 98–111)
Creatinine, Ser: 0.9 mg/dL (ref 0.61–1.24)
Creatinine, Ser: 0.8 mg/dL (ref 0.61–1.24)
Creatinine, Ser: 0.8 mg/dL (ref 0.61–1.24)
Creatinine, Ser: 0.8 mg/dL (ref 0.61–1.24)
Creatinine, Ser: 0.9 mg/dL (ref 0.61–1.24)
Glucose, Bld: 103 mg/dL — ABNORMAL HIGH (ref 70–99)
Glucose, Bld: 123 mg/dL — ABNORMAL HIGH (ref 70–99)
Glucose, Bld: 118 mg/dL — ABNORMAL HIGH (ref 70–99)
Glucose, Bld: 129 mg/dL — ABNORMAL HIGH (ref 70–99)
Glucose, Bld: 127 mg/dL — ABNORMAL HIGH (ref 70–99)
HCT: 37 % — ABNORMAL LOW (ref 39.0–52.0)
HCT: 36 % — ABNORMAL LOW (ref 39.0–52.0)
HCT: 28 % — ABNORMAL LOW (ref 39.0–52.0)
HCT: 27 % — ABNORMAL LOW (ref 39.0–52.0)
HCT: 29 % — ABNORMAL LOW (ref 39.0–52.0)
Hemoglobin: 12.6 g/dL — ABNORMAL LOW (ref 13.0–17.0)
Hemoglobin: 12.2 g/dL — ABNORMAL LOW (ref 13.0–17.0)
Hemoglobin: 9.5 g/dL — ABNORMAL LOW (ref 13.0–17.0)
Hemoglobin: 9.2 g/dL — ABNORMAL LOW (ref 13.0–17.0)
Hemoglobin: 9.9 g/dL — ABNORMAL LOW (ref 13.0–17.0)
Potassium: 3.9 mmol/L (ref 3.5–5.1)
Potassium: 4.3 mmol/L (ref 3.5–5.1)
Potassium: 4.1 mmol/L (ref 3.5–5.1)
Potassium: 5.3 mmol/L — ABNORMAL HIGH (ref 3.5–5.1)
Potassium: 4.6 mmol/L (ref 3.5–5.1)
Sodium: 139 mmol/L (ref 135–145)
Sodium: 139 mmol/L (ref 135–145)
Sodium: 138 mmol/L (ref 135–145)
Sodium: 135 mmol/L (ref 135–145)
Sodium: 137 mmol/L (ref 135–145)
TCO2: 27 mmol/L (ref 22–32)
TCO2: 26 mmol/L (ref 22–32)
TCO2: 28 mmol/L (ref 22–32)
TCO2: 28 mmol/L (ref 22–32)
TCO2: 24 mmol/L (ref 22–32)

## 2019-04-02 LAB — POCT I-STAT 7, (LYTES, BLD GAS, ICA,H+H)
Acid-base deficit: 2 mmol/L (ref 0.0–2.0)
Acid-base deficit: 5 mmol/L — ABNORMAL HIGH (ref 0.0–2.0)
Acid-base deficit: 7 mmol/L — ABNORMAL HIGH (ref 0.0–2.0)
Acid-base deficit: 9 mmol/L — ABNORMAL HIGH (ref 0.0–2.0)
Bicarbonate: 26.6 mmol/L (ref 20.0–28.0)
Bicarbonate: 23.5 mmol/L (ref 20.0–28.0)
Bicarbonate: 20.7 mmol/L (ref 20.0–28.0)
Bicarbonate: 17.8 mmol/L — ABNORMAL LOW (ref 20.0–28.0)
Bicarbonate: 16.9 mmol/L — ABNORMAL LOW (ref 20.0–28.0)
Calcium, Ion: 1.11 mmol/L — ABNORMAL LOW (ref 1.15–1.40)
Calcium, Ion: 1.31 mmol/L (ref 1.15–1.40)
Calcium, Ion: 1.25 mmol/L (ref 1.15–1.40)
Calcium, Ion: 1.15 mmol/L (ref 1.15–1.40)
Calcium, Ion: 1.17 mmol/L (ref 1.15–1.40)
HCT: 30 % — ABNORMAL LOW (ref 39.0–52.0)
HCT: 30 % — ABNORMAL LOW (ref 39.0–52.0)
HCT: 36 % — ABNORMAL LOW (ref 39.0–52.0)
HCT: 33 % — ABNORMAL LOW (ref 39.0–52.0)
HCT: 33 % — ABNORMAL LOW (ref 39.0–52.0)
Hemoglobin: 10.2 g/dL — ABNORMAL LOW (ref 13.0–17.0)
Hemoglobin: 10.2 g/dL — ABNORMAL LOW (ref 13.0–17.0)
Hemoglobin: 12.2 g/dL — ABNORMAL LOW (ref 13.0–17.0)
Hemoglobin: 11.2 g/dL — ABNORMAL LOW (ref 13.0–17.0)
Hemoglobin: 11.2 g/dL — ABNORMAL LOW (ref 13.0–17.0)
O2 Saturation: 100 %
O2 Saturation: 92 %
O2 Saturation: 90 %
O2 Saturation: 93 %
O2 Saturation: 92 %
Patient temperature: 37.5
Patient temperature: 37.4
Potassium: 4 mmol/L (ref 3.5–5.1)
Potassium: 4.6 mmol/L (ref 3.5–5.1)
Potassium: 4.2 mmol/L (ref 3.5–5.1)
Potassium: 4.2 mmol/L (ref 3.5–5.1)
Potassium: 4.2 mmol/L (ref 3.5–5.1)
Sodium: 138 mmol/L (ref 135–145)
Sodium: 138 mmol/L (ref 135–145)
Sodium: 137 mmol/L (ref 135–145)
Sodium: 141 mmol/L (ref 135–145)
Sodium: 141 mmol/L (ref 135–145)
TCO2: 28 mmol/L (ref 22–32)
TCO2: 25 mmol/L (ref 22–32)
TCO2: 22 mmol/L (ref 22–32)
TCO2: 19 mmol/L — ABNORMAL LOW (ref 22–32)
TCO2: 18 mmol/L — ABNORMAL LOW (ref 22–32)
pCO2 arterial: 49.4 mmHg — ABNORMAL HIGH (ref 32.0–48.0)
pCO2 arterial: 43.9 mmHg (ref 32.0–48.0)
pCO2 arterial: 39.2 mmHg (ref 32.0–48.0)
pCO2 arterial: 32.7 mmHg (ref 32.0–48.0)
pCO2 arterial: 35.6 mmHg (ref 32.0–48.0)
pH, Arterial: 7.339 — ABNORMAL LOW (ref 7.350–7.450)
pH, Arterial: 7.337 — ABNORMAL LOW (ref 7.350–7.450)
pH, Arterial: 7.33 — ABNORMAL LOW (ref 7.350–7.450)
pH, Arterial: 7.346 — ABNORMAL LOW (ref 7.350–7.450)
pH, Arterial: 7.286 — ABNORMAL LOW (ref 7.350–7.450)
pO2, Arterial: 391 mmHg — ABNORMAL HIGH (ref 83.0–108.0)
pO2, Arterial: 67 mmHg — ABNORMAL LOW (ref 83.0–108.0)
pO2, Arterial: 63 mmHg — ABNORMAL LOW (ref 83.0–108.0)
pO2, Arterial: 72 mmHg — ABNORMAL LOW (ref 83.0–108.0)
pO2, Arterial: 72 mmHg — ABNORMAL LOW (ref 83.0–108.0)

## 2019-04-02 LAB — CBC
HCT: 40.4 % (ref 39.0–52.0)
HCT: 38.3 % — ABNORMAL LOW (ref 39.0–52.0)
HCT: 34.7 % — ABNORMAL LOW (ref 39.0–52.0)
Hemoglobin: 13.3 g/dL (ref 13.0–17.0)
Hemoglobin: 12.4 g/dL — ABNORMAL LOW (ref 13.0–17.0)
Hemoglobin: 11 g/dL — ABNORMAL LOW (ref 13.0–17.0)
MCH: 30.9 pg (ref 26.0–34.0)
MCH: 31.2 pg (ref 26.0–34.0)
MCH: 31.1 pg (ref 26.0–34.0)
MCHC: 32.9 g/dL (ref 30.0–36.0)
MCHC: 32.4 g/dL (ref 30.0–36.0)
MCHC: 31.7 g/dL (ref 30.0–36.0)
MCV: 94 fL (ref 80.0–100.0)
MCV: 96.5 fL (ref 80.0–100.0)
MCV: 98 fL (ref 80.0–100.0)
Platelets: 196 10*3/uL (ref 150–400)
Platelets: 152 10*3/uL (ref 150–400)
Platelets: 154 10*3/uL (ref 150–400)
RBC: 4.3 MIL/uL (ref 4.22–5.81)
RBC: 3.97 MIL/uL — ABNORMAL LOW (ref 4.22–5.81)
RBC: 3.54 MIL/uL — ABNORMAL LOW (ref 4.22–5.81)
RDW: 12.9 % (ref 11.5–15.5)
RDW: 12.7 % (ref 11.5–15.5)
RDW: 12.9 % (ref 11.5–15.5)
WBC: 7.3 10*3/uL (ref 4.0–10.5)
WBC: 14.6 10*3/uL — ABNORMAL HIGH (ref 4.0–10.5)
WBC: 16.6 10*3/uL — ABNORMAL HIGH (ref 4.0–10.5)
nRBC: 0 % (ref 0.0–0.2)
nRBC: 0 % (ref 0.0–0.2)
nRBC: 0 % (ref 0.0–0.2)

## 2019-04-02 LAB — BASIC METABOLIC PANEL
Anion gap: 8 (ref 5–15)
Anion gap: 9 (ref 5–15)
BUN: 16 mg/dL (ref 8–23)
BUN: 16 mg/dL (ref 8–23)
CO2: 22 mmol/L (ref 22–32)
CO2: 19 mmol/L — ABNORMAL LOW (ref 22–32)
Calcium: 9.2 mg/dL (ref 8.9–10.3)
Calcium: 8.1 mg/dL — ABNORMAL LOW (ref 8.9–10.3)
Chloride: 107 mmol/L (ref 98–111)
Chloride: 111 mmol/L (ref 98–111)
Creatinine, Ser: 1.05 mg/dL (ref 0.61–1.24)
Creatinine, Ser: 1.18 mg/dL (ref 0.61–1.24)
GFR calc Af Amer: >60 mL/min (ref >60)
GFR calc Af Amer: >60 mL/min (ref >60)
GFR calc non Af Amer: >60 mL/min (ref >60)
GFR calc non Af Amer: 60 mL/min — ABNORMAL LOW (ref >60)
Glucose, Bld: 104 mg/dL — ABNORMAL HIGH (ref 70–99)
Glucose, Bld: 153 mg/dL — ABNORMAL HIGH (ref 70–99)
Potassium: 3.8 mmol/L (ref 3.5–5.1)
Potassium: 4.4 mmol/L (ref 3.5–5.1)
Sodium: 137 mmol/L (ref 135–145)
Sodium: 139 mmol/L (ref 135–145)

## 2019-04-02 LAB — GLUCOSE, CAPILLARY
Glucose-Capillary: 134 mg/dL — ABNORMAL HIGH (ref 70–99)
Glucose-Capillary: 138 mg/dL — ABNORMAL HIGH (ref 70–99)
Glucose-Capillary: 138 mg/dL — ABNORMAL HIGH (ref 70–99)
Glucose-Capillary: 138 mg/dL — ABNORMAL HIGH (ref 70–99)
Glucose-Capillary: 140 mg/dL — ABNORMAL HIGH (ref 70–99)
Glucose-Capillary: 143 mg/dL — ABNORMAL HIGH (ref 70–99)
Glucose-Capillary: 147 mg/dL — ABNORMAL HIGH (ref 70–99)
Glucose-Capillary: 148 mg/dL — ABNORMAL HIGH (ref 70–99)
Glucose-Capillary: 151 mg/dL — ABNORMAL HIGH (ref 70–99)
Glucose-Capillary: 152 mg/dL — ABNORMAL HIGH (ref 70–99)

## 2019-04-02 LAB — APTT: aPTT: 36 seconds (ref 24–36)

## 2019-04-02 LAB — ECHO INTRAOPERATIVE TEE
Height: 67 in
Weight: 3619.2 oz

## 2019-04-02 LAB — PLATELET COUNT: Platelets: 163 10*3/uL (ref 150–400)

## 2019-04-02 LAB — PROTIME-INR
INR: 1.3 — ABNORMAL HIGH (ref 0.8–1.2)
Prothrombin Time: 16.3 seconds — ABNORMAL HIGH (ref 11.4–15.2)

## 2019-04-02 LAB — HEMOGLOBIN AND HEMATOCRIT, BLOOD
HCT: 28.5 % — ABNORMAL LOW (ref 39.0–52.0)
Hemoglobin: 9.4 g/dL — ABNORMAL LOW (ref 13.0–17.0)

## 2019-04-02 LAB — HEPARIN LEVEL (UNFRACTIONATED): Heparin Unfractionated: <0.1 IU/mL — ABNORMAL LOW (ref 0.30–0.70)

## 2019-04-02 LAB — MAGNESIUM: Magnesium: 3.1 mg/dL — ABNORMAL HIGH (ref 1.7–2.4)

## 2019-04-02 SURGERY — CORONARY ARTERY BYPASS GRAFTING (CABG)
Anesthesia: General | Site: Chest

## 2019-04-02 MED ORDER — NOREPINEPHRINE 4 MG/250ML-% IV SOLN: 0.0000 ug/min | INTRAVENOUS | Status: DC

## 2019-04-02 MED ORDER — DEXMEDETOMIDINE HCL IN NACL 400 MCG/100ML IV SOLN
0.0000 ug/kg/h | INTRAVENOUS | Status: DC
  Filled 2019-04-02: qty 100

## 2019-04-02 MED ORDER — VANCOMYCIN HCL IN DEXTROSE 1-5 GM/200ML-% IV SOLN
1000.0000 mg | Freq: Once | INTRAVENOUS | Status: AC
  Administered 2019-04-02: 1000 mg via INTRAVENOUS

## 2019-04-02 MED ORDER — ASPIRIN EC 325 MG PO TBEC
325.0000 mg | DELAYED_RELEASE_TABLET | Freq: Every day | ORAL | Status: DC
  Administered 2019-04-03: 325 mg via ORAL
  Filled 2019-04-02: qty 1

## 2019-04-02 MED ORDER — CHLORHEXIDINE GLUCONATE 0.12 % MT SOLN
15.0000 mL | OROMUCOSAL | Status: AC
  Administered 2019-04-02: 15 mL via OROMUCOSAL

## 2019-04-02 MED ORDER — MIDAZOLAM HCL (PF) 10 MG/2ML IJ SOLN
INTRAMUSCULAR | Status: AC
  Filled 2019-04-02: qty 2

## 2019-04-02 MED ORDER — SODIUM CHLORIDE (PF) 0.9 % IJ SOLN
INTRAMUSCULAR | Status: AC
  Filled 2019-04-02: qty 10

## 2019-04-02 MED ORDER — ALBUTEROL SULFATE HFA 108 (90 BASE) MCG/ACT IN AERS
INHALATION_SPRAY | RESPIRATORY_TRACT | Status: AC
  Filled 2019-04-02: qty 6.7

## 2019-04-02 MED ORDER — 0.9 % SODIUM CHLORIDE (POUR BTL) OPTIME
TOPICAL | Status: DC | PRN
  Administered 2019-04-02: 5000 mL

## 2019-04-02 MED ORDER — LACTATED RINGERS IV SOLN: INTRAVENOUS | Status: DC

## 2019-04-02 MED ORDER — INSULIN REGULAR(HUMAN) IN NACL 100-0.9 UT/100ML-% IV SOLN: INTRAVENOUS | Status: DC

## 2019-04-02 MED ORDER — ASPIRIN 81 MG PO CHEW: 324.0000 mg | CHEWABLE_TABLET | Freq: Every day | ORAL | Status: DC

## 2019-04-02 MED ORDER — SODIUM CHLORIDE 0.9 % IV SOLN
INTRAVENOUS | Status: DC
  Administered 2019-04-02: 14:00:00 via INTRAVENOUS

## 2019-04-02 MED ORDER — LACTATED RINGERS IV SOLN
INTRAVENOUS | Status: DC | PRN
  Administered 2019-04-02: 07:00:00 via INTRAVENOUS

## 2019-04-02 MED ORDER — DEXMEDETOMIDINE HCL IN NACL 400 MCG/100ML IV SOLN
INTRAVENOUS | Status: DC | PRN
  Administered 2019-04-02: .2 ug/kg/h via INTRAVENOUS

## 2019-04-02 MED ORDER — ALPRAZOLAM 0.25 MG PO TABS
0.2500 mg | ORAL_TABLET | Freq: Two times a day (BID) | ORAL | Status: DC | PRN
  Administered 2019-04-03 – 2019-04-07 (×4): 0.25 mg via ORAL
  Filled 2019-04-02 (×4): qty 1

## 2019-04-02 MED ORDER — ONDANSETRON HCL 4 MG/2ML IJ SOLN
INTRAMUSCULAR | Status: AC
  Filled 2019-04-02: qty 2

## 2019-04-02 MED ORDER — SODIUM CHLORIDE 0.9% FLUSH
3.0000 mL | INTRAVENOUS | Status: DC | PRN
  Administered 2019-04-08: 10:00:00 3 mL via INTRAVENOUS
  Filled 2019-04-02: qty 3

## 2019-04-02 MED ORDER — MIDAZOLAM HCL 2 MG/2ML IJ SOLN: 2.0000 mg | INTRAMUSCULAR | Status: DC | PRN

## 2019-04-02 MED ORDER — NOREPINEPHRINE 4 MG/250ML-% IV SOLN
0.0000 ug/min | INTRAVENOUS | Status: DC
  Filled 2019-04-02: qty 250

## 2019-04-02 MED ORDER — ORAL CARE MOUTH RINSE
15.0000 mL | OROMUCOSAL | Status: DC
  Administered 2019-04-02 (×2): 15 mL via OROMUCOSAL

## 2019-04-02 MED ORDER — FENTANYL CITRATE (PF) 250 MCG/5ML IJ SOLN
INTRAMUSCULAR | Status: AC
  Filled 2019-04-02: qty 25

## 2019-04-02 MED ORDER — SODIUM CHLORIDE 0.45 % IV SOLN
INTRAVENOUS | Status: DC | PRN
  Administered 2019-04-02: 14:00:00 via INTRAVENOUS

## 2019-04-02 MED ORDER — LACTATED RINGERS IV SOLN
INTRAVENOUS | Status: DC | PRN
  Administered 2019-04-02 (×2): via INTRAVENOUS

## 2019-04-02 MED ORDER — DOCUSATE SODIUM 100 MG PO CAPS
200.0000 mg | ORAL_CAPSULE | Freq: Every day | ORAL | Status: DC
  Administered 2019-04-03 – 2019-04-08 (×5): 200 mg via ORAL
  Filled 2019-04-02 (×5): qty 2

## 2019-04-02 MED ORDER — ROCURONIUM BROMIDE 10 MG/ML (PF) SYRINGE
PREFILLED_SYRINGE | INTRAVENOUS | Status: AC
  Filled 2019-04-02: qty 10

## 2019-04-02 MED ORDER — OXYCODONE HCL 5 MG PO TABS
5.0000 mg | ORAL_TABLET | ORAL | Status: DC | PRN
  Administered 2019-04-02 – 2019-04-03 (×4): 10 mg via ORAL
  Administered 2019-04-03: 5 mg via ORAL
  Administered 2019-04-03 (×2): 10 mg via ORAL
  Administered 2019-04-04: 07:00:00 5 mg via ORAL
  Administered 2019-04-04: 20:00:00 10 mg via ORAL
  Administered 2019-04-04: 5 mg via ORAL
  Administered 2019-04-05 – 2019-04-08 (×5): 10 mg via ORAL
  Filled 2019-04-02 (×11): qty 2
  Filled 2019-04-02: qty 1
  Filled 2019-04-02 (×2): qty 2

## 2019-04-02 MED ORDER — PLASMA-LYTE 148 IV SOLN
INTRAVENOUS | Status: DC | PRN
  Administered 2019-04-02: 500 mL

## 2019-04-02 MED ORDER — SODIUM CHLORIDE (PF) 0.9 % IJ SOLN
OROMUCOSAL | Status: DC | PRN
  Administered 2019-04-02 (×3): 4 mL via TOPICAL

## 2019-04-02 MED ORDER — PHENYLEPHRINE HCL-NACL 20-0.9 MG/250ML-% IV SOLN: 0.0000 ug/min | INTRAVENOUS | Status: DC

## 2019-04-02 MED ORDER — ROCURONIUM BROMIDE 10 MG/ML (PF) SYRINGE
PREFILLED_SYRINGE | INTRAVENOUS | Status: DC | PRN
  Administered 2019-04-02: 60 mg via INTRAVENOUS
  Administered 2019-04-02: 80 mg via INTRAVENOUS
  Administered 2019-04-02: 20 mg via INTRAVENOUS
  Administered 2019-04-02: 40 mg via INTRAVENOUS
  Administered 2019-04-02: 30 mg via INTRAVENOUS

## 2019-04-02 MED ORDER — ALBUTEROL SULFATE HFA 108 (90 BASE) MCG/ACT IN AERS
INHALATION_SPRAY | RESPIRATORY_TRACT | Status: DC | PRN
  Administered 2019-04-02: 4 via RESPIRATORY_TRACT

## 2019-04-02 MED ORDER — ATORVASTATIN CALCIUM 80 MG PO TABS
80.0000 mg | ORAL_TABLET | Freq: Every evening | ORAL | Status: DC
  Administered 2019-04-03 – 2019-04-07 (×5): 80 mg via ORAL
  Filled 2019-04-02 (×5): qty 1

## 2019-04-02 MED ORDER — NICARDIPINE HCL IN NACL 20-0.86 MG/200ML-% IV SOLN
5.0000 mg/h | INTRAVENOUS | Status: DC
  Filled 2019-04-02: qty 200

## 2019-04-02 MED ORDER — ONDANSETRON HCL 4 MG/2ML IJ SOLN
4.0000 mg | Freq: Four times a day (QID) | INTRAMUSCULAR | Status: DC | PRN
  Administered 2019-04-02 – 2019-04-05 (×5): 4 mg via INTRAVENOUS
  Filled 2019-04-02 (×6): qty 2

## 2019-04-02 MED ORDER — PHENYLEPHRINE HCL-NACL 10-0.9 MG/250ML-% IV SOLN
INTRAVENOUS | Status: DC | PRN
  Administered 2019-04-02: 25 ug/min via INTRAVENOUS

## 2019-04-02 MED ORDER — POTASSIUM CHLORIDE 10 MEQ/50ML IV SOLN: 10.0000 meq | INTRAVENOUS | Status: AC

## 2019-04-02 MED ORDER — SODIUM CHLORIDE 0.9% FLUSH
3.0000 mL | Freq: Two times a day (BID) | INTRAVENOUS | Status: DC
  Administered 2019-04-03 – 2019-04-07 (×7): 3 mL via INTRAVENOUS

## 2019-04-02 MED ORDER — ORAL CARE MOUTH RINSE
15.0000 mL | Freq: Two times a day (BID) | OROMUCOSAL | Status: DC
  Administered 2019-04-02 – 2019-04-07 (×4): 15 mL via OROMUCOSAL

## 2019-04-02 MED ORDER — ALBUMIN HUMAN 5 % IV SOLN
INTRAVENOUS | Status: DC | PRN
  Administered 2019-04-02: 13:00:00 via INTRAVENOUS

## 2019-04-02 MED ORDER — ONDANSETRON HCL 4 MG/2ML IJ SOLN
INTRAMUSCULAR | Status: DC | PRN
  Administered 2019-04-02: 4 mg via INTRAVENOUS

## 2019-04-02 MED ORDER — BISACODYL 10 MG RE SUPP: 10.0000 mg | Freq: Every day | RECTAL | Status: DC

## 2019-04-02 MED ORDER — DEXTROSE 50 % IV SOLN: 0.0000 mL | INTRAVENOUS | Status: DC | PRN

## 2019-04-02 MED ORDER — ACETAMINOPHEN 650 MG RE SUPP
650.0000 mg | Freq: Once | RECTAL | Status: AC
  Administered 2019-04-02: 650 mg via RECTAL

## 2019-04-02 MED ORDER — SODIUM CHLORIDE 0.9 % IV SOLN
INTRAVENOUS | Status: DC | PRN
  Administered 2019-04-02: 13:00:00 via INTRAVENOUS

## 2019-04-02 MED ORDER — PROTAMINE SULFATE 10 MG/ML IV SOLN
INTRAVENOUS | Status: DC | PRN
  Administered 2019-04-02: 350 mg via INTRAVENOUS

## 2019-04-02 MED ORDER — LACTATED RINGERS IV SOLN
500.0000 mL | Freq: Once | INTRAVENOUS | Status: AC | PRN
  Administered 2019-04-02: 17:00:00 500 mL via INTRAVENOUS

## 2019-04-02 MED ORDER — METOPROLOL TARTRATE 12.5 MG HALF TABLET
12.5000 mg | ORAL_TABLET | Freq: Two times a day (BID) | ORAL | Status: DC
  Filled 2019-04-02: qty 1

## 2019-04-02 MED ORDER — METOPROLOL TARTRATE 5 MG/5ML IV SOLN: 2.5000 mg | INTRAVENOUS | Status: DC | PRN

## 2019-04-02 MED ORDER — LACTATED RINGERS IV SOLN
INTRAVENOUS | Status: DC
  Administered 2019-04-02 – 2019-04-03 (×2): via INTRAVENOUS

## 2019-04-02 MED ORDER — PROPOFOL 10 MG/ML IV BOLUS
INTRAVENOUS | Status: AC
  Filled 2019-04-02: qty 20

## 2019-04-02 MED ORDER — PANTOPRAZOLE SODIUM 40 MG PO TBEC
40.0000 mg | DELAYED_RELEASE_TABLET | Freq: Every day | ORAL | Status: DC
  Administered 2019-04-04 – 2019-04-08 (×5): 40 mg via ORAL
  Filled 2019-04-02 (×5): qty 1

## 2019-04-02 MED ORDER — SODIUM CHLORIDE 0.9 % IV SOLN: 250.0000 mL | INTRAVENOUS | Status: DC

## 2019-04-02 MED ORDER — HEPARIN SODIUM (PORCINE) 1000 UNIT/ML IJ SOLN
INTRAMUSCULAR | Status: DC | PRN
  Administered 2019-04-02: 35000 [IU] via INTRAVENOUS

## 2019-04-02 MED ORDER — HEPARIN SODIUM (PORCINE) 1000 UNIT/ML IJ SOLN
INTRAMUSCULAR | Status: AC
  Filled 2019-04-02: qty 1

## 2019-04-02 MED ORDER — CHLORHEXIDINE GLUCONATE 0.12% ORAL RINSE (MEDLINE KIT): 15.0000 mL | Freq: Two times a day (BID) | OROMUCOSAL | Status: DC

## 2019-04-02 MED ORDER — LIDOCAINE 2% (20 MG/ML) 5 ML SYRINGE
INTRAMUSCULAR | Status: AC
  Filled 2019-04-02: qty 5

## 2019-04-02 MED ORDER — SODIUM CHLORIDE 0.9% FLUSH: 10.0000 mL | INTRAVENOUS | Status: DC | PRN

## 2019-04-02 MED ORDER — CHLORHEXIDINE GLUCONATE CLOTH 2 % EX PADS
6.0000 | MEDICATED_PAD | Freq: Every day | CUTANEOUS | Status: DC
  Administered 2019-04-02 – 2019-04-05 (×4): 6 via TOPICAL

## 2019-04-02 MED ORDER — MAGNESIUM SULFATE 4 GM/100ML IV SOLN
4.0000 g | Freq: Once | INTRAVENOUS | Status: AC
  Administered 2019-04-02: 15:00:00 4 g via INTRAVENOUS
  Filled 2019-04-02: qty 100

## 2019-04-02 MED ORDER — TRAMADOL HCL 50 MG PO TABS
50.0000 mg | ORAL_TABLET | ORAL | Status: DC | PRN
  Administered 2019-04-03: 100 mg via ORAL
  Administered 2019-04-03: 22:00:00 50 mg via ORAL
  Filled 2019-04-02: qty 2
  Filled 2019-04-02: qty 1

## 2019-04-02 MED ORDER — BISACODYL 5 MG PO TBEC
10.0000 mg | DELAYED_RELEASE_TABLET | Freq: Every day | ORAL | Status: DC
  Administered 2019-04-03 – 2019-04-08 (×5): 10 mg via ORAL
  Filled 2019-04-02 (×6): qty 2

## 2019-04-02 MED ORDER — TRANEXAMIC ACID 1000 MG/10ML IV SOLN: INTRAVENOUS | Status: DC | PRN

## 2019-04-02 MED ORDER — ACETAMINOPHEN 160 MG/5ML PO SOLN: 1000.0000 mg | Freq: Four times a day (QID) | ORAL | Status: AC

## 2019-04-02 MED ORDER — PROPOFOL 10 MG/ML IV BOLUS
INTRAVENOUS | Status: DC | PRN
  Administered 2019-04-02: 100 mg via INTRAVENOUS

## 2019-04-02 MED ORDER — PHENYLEPHRINE 40 MCG/ML (10ML) SYRINGE FOR IV PUSH (FOR BLOOD PRESSURE SUPPORT)
PREFILLED_SYRINGE | INTRAVENOUS | Status: DC | PRN
  Administered 2019-04-02: 160 ug via INTRAVENOUS

## 2019-04-02 MED ORDER — METOPROLOL TARTRATE 25 MG/10 ML ORAL SUSPENSION: 12.5000 mg | Freq: Two times a day (BID) | ORAL | Status: DC

## 2019-04-02 MED ORDER — FAMOTIDINE IN NACL 20-0.9 MG/50ML-% IV SOLN
20.0000 mg | Freq: Two times a day (BID) | INTRAVENOUS | Status: AC
  Administered 2019-04-02 (×2): 20 mg via INTRAVENOUS
  Filled 2019-04-02: qty 50

## 2019-04-02 MED ORDER — PROTAMINE SULFATE 10 MG/ML IV SOLN
INTRAVENOUS | Status: AC
  Filled 2019-04-02: qty 25

## 2019-04-02 MED ORDER — SODIUM BICARBONATE 8.4 % IV SOLN
25.0000 meq | Freq: Once | INTRAVENOUS | Status: AC
  Administered 2019-04-02: 25 meq via INTRAVENOUS

## 2019-04-02 MED ORDER — SODIUM CHLORIDE 0.9 % IV SOLN
1.5000 g | Freq: Two times a day (BID) | INTRAVENOUS | Status: AC
  Administered 2019-04-02 – 2019-04-04 (×4): 1.5 g via INTRAVENOUS
  Filled 2019-04-02 (×4): qty 1.5

## 2019-04-02 MED ORDER — ACETAMINOPHEN 500 MG PO TABS
1000.0000 mg | ORAL_TABLET | Freq: Four times a day (QID) | ORAL | Status: AC
  Administered 2019-04-02 – 2019-04-07 (×16): 1000 mg via ORAL
  Filled 2019-04-02 (×16): qty 2

## 2019-04-02 MED ORDER — ALBUMIN HUMAN 5 % IV SOLN
250.0000 mL | INTRAVENOUS | Status: AC | PRN
  Administered 2019-04-02 (×2): 12.5 g via INTRAVENOUS

## 2019-04-02 MED ORDER — ACETAMINOPHEN 160 MG/5ML PO SOLN: 650.0000 mg | Freq: Once | ORAL | Status: AC

## 2019-04-02 MED ORDER — SODIUM CHLORIDE 0.9% FLUSH
10.0000 mL | Freq: Two times a day (BID) | INTRAVENOUS | Status: DC
  Administered 2019-04-02 – 2019-04-04 (×4): 10 mL

## 2019-04-02 MED ORDER — TRANEXAMIC ACID 1000 MG/10ML IV SOLN
INTRAVENOUS | Status: DC | PRN
  Administered 2019-04-02: 1.5 mg/kg/h via INTRAVENOUS

## 2019-04-02 MED ORDER — MORPHINE SULFATE (PF) 2 MG/ML IV SOLN
1.0000 mg | INTRAVENOUS | Status: DC | PRN
  Administered 2019-04-02 – 2019-04-04 (×7): 2 mg via INTRAVENOUS
  Filled 2019-04-02 (×5): qty 1
  Filled 2019-04-02: qty 2
  Filled 2019-04-02 (×2): qty 1

## 2019-04-02 SURGICAL SUPPLY — 107 items
ADH SKN CLS APL DERMABOND .7 (GAUZE/BANDAGES/DRESSINGS) ×1
BAG DECANTER FOR FLEXI CONT (MISCELLANEOUS) ×3 IMPLANT
BASKET HEART (ORDER IN 25'S) (MISCELLANEOUS) ×1
BASKET HEART (ORDER IN 25S) (MISCELLANEOUS) ×1 IMPLANT
BLADE CLIPPER SURG (BLADE) ×1 IMPLANT
BLADE STERNUM SYSTEM 6 (BLADE) ×2 IMPLANT
BLADE SURG 11 STRL SS (BLADE) ×1 IMPLANT
BNDG ELASTIC 4X5.8 VLCR STR LF (GAUZE/BANDAGES/DRESSINGS) ×2 IMPLANT
BNDG ELASTIC 6X5.8 VLCR STR LF (GAUZE/BANDAGES/DRESSINGS) ×2 IMPLANT
BNDG GAUZE ELAST 4 BULKY (GAUZE/BANDAGES/DRESSINGS) ×2 IMPLANT
CANISTER SUCT 3000ML PPV (MISCELLANEOUS) ×2 IMPLANT
CANNULA EZ GLIDE 8.0 24FR (CANNULA) ×2 IMPLANT
CANNULA MC2 2 STG 29/37 NON-V (CANNULA) ×1 IMPLANT
CANNULA MC2 TWO STAGE (CANNULA) ×1
CATH CPB KIT HENDRICKSON (MISCELLANEOUS) ×2 IMPLANT
CLIP RETRACTION 3.0MM CORONARY (MISCELLANEOUS) ×2 IMPLANT
CLIP VESOCCLUDE MED 24/CT (CLIP) IMPLANT
CLIP VESOCCLUDE SM WIDE 24/CT (CLIP) IMPLANT
CONN ST 1/2X1/2  BEN (MISCELLANEOUS) ×1
CONN ST 1/2X1/2 BEN (MISCELLANEOUS) ×1 IMPLANT
CONNECTOR BLAKE 2:1 CARIO BLK (MISCELLANEOUS) ×2 IMPLANT
COVER WAND RF STERILE (DRAPES) ×1 IMPLANT
DEFOGGER ANTIFOG KIT (MISCELLANEOUS) ×1 IMPLANT
DERMABOND ADVANCED (GAUZE/BANDAGES/DRESSINGS) ×1
DERMABOND ADVANCED .7 DNX12 (GAUZE/BANDAGES/DRESSINGS) IMPLANT
DRAIN CHANNEL 19F RND (DRAIN) ×3 IMPLANT
DRAIN CONNECTOR BLAKE 1:1 (MISCELLANEOUS) ×1 IMPLANT
DRAPE CARDIOVASCULAR INCISE (DRAPES) ×2
DRAPE INCISE IOBAN 66X45 STRL (DRAPES) ×1 IMPLANT
DRAPE SLUSH/WARMER DISC (DRAPES) ×2 IMPLANT
DRAPE SRG 135X102X78XABS (DRAPES) ×1 IMPLANT
DRSG COVADERM 4X14 (GAUZE/BANDAGES/DRESSINGS) ×2 IMPLANT
ELECT BLADE 4.0 EZ CLEAN MEGAD (MISCELLANEOUS) ×2
ELECT REM PT RETURN 9FT ADLT (ELECTROSURGICAL) ×4
ELECTRODE BLDE 4.0 EZ CLN MEGD (MISCELLANEOUS) ×1 IMPLANT
ELECTRODE REM PT RTRN 9FT ADLT (ELECTROSURGICAL) ×2 IMPLANT
FELT TEFLON 1X6 (MISCELLANEOUS) ×2 IMPLANT
GAUZE SPONGE 4X4 12PLY STRL (GAUZE/BANDAGES/DRESSINGS) ×4 IMPLANT
GAUZE SPONGE 4X4 12PLY STRL LF (GAUZE/BANDAGES/DRESSINGS) ×2 IMPLANT
GLOVE BIO SURGEON STRL SZ7 (GLOVE) ×2 IMPLANT
GLOVE BIOGEL PI IND STRL 6 (GLOVE) IMPLANT
GLOVE BIOGEL PI IND STRL 6.5 (GLOVE) IMPLANT
GLOVE BIOGEL PI IND STRL 7.5 (GLOVE) ×3 IMPLANT
GLOVE BIOGEL PI INDICATOR 6 (GLOVE) ×4
GLOVE BIOGEL PI INDICATOR 6.5 (GLOVE) ×1
GLOVE BIOGEL PI INDICATOR 7.5 (GLOVE) ×3
GLOVE SURG SS PI 6.0 STRL IVOR (GLOVE) ×8 IMPLANT
GLOVE SURG SS PI 6.5 STRL IVOR (GLOVE) ×1 IMPLANT
GLOVE SURG SS PI 7.5 STRL IVOR (GLOVE) ×5 IMPLANT
GOWN STRL REUS W/ TWL LRG LVL3 (GOWN DISPOSABLE) ×8 IMPLANT
GOWN STRL REUS W/ TWL XL LVL3 (GOWN DISPOSABLE) ×2 IMPLANT
GOWN STRL REUS W/TWL LRG LVL3 (GOWN DISPOSABLE) ×16
GOWN STRL REUS W/TWL XL LVL3 (GOWN DISPOSABLE) ×4
HEMOSTAT POWDER SURGIFOAM 1G (HEMOSTASIS) ×6 IMPLANT
KIT BASIN OR (CUSTOM PROCEDURE TRAY) ×2 IMPLANT
KIT DRAINAGE VACCUM ASSIST (KITS) ×1 IMPLANT
KIT SUCTION CATH 14FR (SUCTIONS) ×5 IMPLANT
KIT TURNOVER KIT B (KITS) ×2 IMPLANT
KIT VASOVIEW HEMOPRO 2 VH 4000 (KITS) ×2 IMPLANT
LEAD PACING MYOCARDI (MISCELLANEOUS) ×1 IMPLANT
MARKER GRAFT CORONARY BYPASS (MISCELLANEOUS) ×6 IMPLANT
NS IRRIG 1000ML POUR BTL (IV SOLUTION) ×10 IMPLANT
PACK E OPEN HEART (SUTURE) ×2 IMPLANT
PACK OPEN HEART (CUSTOM PROCEDURE TRAY) ×2 IMPLANT
PAD ARMBOARD 7.5X6 YLW CONV (MISCELLANEOUS) ×4 IMPLANT
PAD ELECT DEFIB RADIOL ZOLL (MISCELLANEOUS) ×2 IMPLANT
PENCIL BUTTON HOLSTER BLD 10FT (ELECTRODE) ×2 IMPLANT
POSITIONER HEAD DONUT 9IN (MISCELLANEOUS) ×2 IMPLANT
PUNCH AORTIC ROT 4.0MM RCL 40 (MISCELLANEOUS) ×1 IMPLANT
PUNCH AORTIC ROTATE 4.0MM (MISCELLANEOUS) IMPLANT
PUNCH AORTIC ROTATE 4.5MM 8IN (MISCELLANEOUS) IMPLANT
PUNCH AORTIC ROTATE 5MM 8IN (MISCELLANEOUS) IMPLANT
SET CARDIOPLEGIA MPS 5001102 (MISCELLANEOUS) ×1 IMPLANT
SPONGE LAP 18X18 RF (DISPOSABLE) IMPLANT
SPONGE LAP 18X18 X RAY DECT (DISPOSABLE) ×2 IMPLANT
SPONGE LAP 4X18 RFD (DISPOSABLE) IMPLANT
SUT BONE WAX W31G (SUTURE) ×2 IMPLANT
SUT ETHIBOND X763 2 0 SH 1 (SUTURE) ×2 IMPLANT
SUT MNCRL AB 3-0 PS2 18 (SUTURE) ×4 IMPLANT
SUT MNCRL AB 4-0 PS2 18 (SUTURE) ×3 IMPLANT
SUT PDS AB 1 CTX 36 (SUTURE) ×4 IMPLANT
SUT PROLENE 2 0 SH DA (SUTURE) IMPLANT
SUT PROLENE 3 0 SH DA (SUTURE) ×2 IMPLANT
SUT PROLENE 3 0 SH1 36 (SUTURE) IMPLANT
SUT PROLENE 4 0 RB 1 (SUTURE)
SUT PROLENE 4 0 SH DA (SUTURE) IMPLANT
SUT PROLENE 4-0 RB1 .5 CRCL 36 (SUTURE) IMPLANT
SUT PROLENE 5 0 C 1 36 (SUTURE) ×4 IMPLANT
SUT PROLENE 6 0 C 1 30 (SUTURE) ×6 IMPLANT
SUT PROLENE 7 0 BV 1 (SUTURE) ×3 IMPLANT
SUT PROLENE 8 0 BV175 6 (SUTURE) IMPLANT
SUT PROLENE BLUE 7 0 (SUTURE) ×2 IMPLANT
SUT PROLENE POLY MONO (SUTURE) IMPLANT
SUT STEEL 6MS V (SUTURE) ×4 IMPLANT
SUT STEEL STERNAL CCS#1 18IN (SUTURE) ×2 IMPLANT
SUT STEEL SZ 6 DBL 3X14 BALL (SUTURE) ×2 IMPLANT
SUT VIC AB 2-0 CT1 27 (SUTURE) ×2
SUT VIC AB 2-0 CT1 TAPERPNT 27 (SUTURE) IMPLANT
SYSTEM SAHARA CHEST DRAIN ATS (WOUND CARE) ×2 IMPLANT
TAPE CLOTH SURG 4X10 WHT LF (GAUZE/BANDAGES/DRESSINGS) ×1 IMPLANT
TOWEL GREEN STERILE (TOWEL DISPOSABLE) ×2 IMPLANT
TOWEL GREEN STERILE FF (TOWEL DISPOSABLE) ×1 IMPLANT
TRAY FOLEY MTR SLVR 16FR STAT (SET/KITS/TRAYS/PACK) ×1 IMPLANT
TRAY FOLEY SLVR 16FR TEMP STAT (SET/KITS/TRAYS/PACK) IMPLANT
TUBING LAP HI FLOW INSUFFLATIO (TUBING) ×2 IMPLANT
UNDERPAD 30X30 (UNDERPADS AND DIAPERS) ×2 IMPLANT
WATER STERILE IRR 1000ML POUR (IV SOLUTION) ×4 IMPLANT

## 2019-04-02 NOTE — Progress Notes (Signed)
     [address]       [place] [address]             [phone]       No events overnight.  Pain well controlled  Today's Vitals   04/02/19 0200 04/02/19 0300 04/02/19 0400 04/02/19 0500  BP: 139/74 119/72 130/72 136/79  Pulse:   60   Resp: 14 (!) 21 15 13   Temp:  (!) 97.5 F (36.4 C)    TempSrc:  Oral    SpO2: 93% 94% 93% 91%  Weight:    102.6 kg  Height:      PainSc:   0-No pain    Body mass index is 35.43 kg/m. Alert NAD Sinus EWOB Lines in place   78 yo male with unstable angina OR today for CABG [place]

## 2019-04-02 NOTE — Anesthesia Procedure Notes (Signed)
Procedure Name: Intubation Date/Time: 04/02/2019 7:58 AM Performed by: Kyung Rudd, CRNA Pre-anesthesia Checklist: Patient identified, Emergency Drugs available, Suction available, Patient being monitored and Timeout performed Patient Re-evaluated:Patient Re-evaluated prior to induction Oxygen Delivery Method: Circle system utilized Preoxygenation: Pre-oxygenation with 100% oxygen Induction Type: IV induction Ventilation: Mask ventilation without difficulty and Oral airway inserted - appropriate to patient size Laryngoscope Size: Mac and 4 Grade View: Grade I Tube type: Oral Tube size: 8.0 mm Number of attempts: 1 Airway Equipment and Method: Stylet Placement Confirmation: ETT inserted through vocal cords under direct vision,  positive ETCO2 and breath sounds checked- equal and bilateral Secured at: 22 cm Tube secured with: Tape Dental Injury: Teeth and Oropharynx as per pre-operative assessment

## 2019-04-02 NOTE — Progress Notes (Signed)
Echocardiogram Echocardiogram Transesophageal has been performed.  Oneal Deputy Analiya Porco 04/02/2019, 1:43 PM

## 2019-04-02 NOTE — Progress Notes (Signed)
Patient ID: Jake Dunn, male   DOB: January 17, 1941, 78 y.o.   MRN: [id number] EVENING ROUNDS NOTE :     [address]       [place] [address]             [phone]                 Day of Surgery Procedure(s) (LRB): CORONARY ARTERY BYPASS GRAFTING (CABG) using LIMA to LAD; Endoscopically harvested right greater saphenous vein to the PLB, Ramus, and Diag 2. (N/A)  Total Length of Stay:  LOS: 4 days  BP (!) 110/51   Pulse 96   Temp 99.5 F (37.5 C)   Resp 19   Ht 5\' 7"  (1.702 m)   Wt 102.6 kg   SpO2 90%   BMI 35.43 kg/m   .Intake/Output      11/23 0701 - 11/24 0700 11/24 0701 - 11/25 0700   P.O. 120    I.V. (mL/kg) 581.6 (5.7) 3057.3 (29.8)   Blood  550   IV Piggyback  366   Total Intake(mL/kg) 701.6 (6.8) 3973.3 (38.7)   Urine (mL/kg/hr) 1925 (0.8) 2035 (1.7)   Blood  950   Chest Tube  180   Total Output 1925 3165   Net -1223.4 +808.3          . sodium chloride 20 mL/hr at 04/02/19 1800  . [START ON 04/03/2019] sodium chloride    . sodium chloride 20 mL/hr at 04/02/19 1353  . albumin human 12.5 g (04/02/19 1519)  . cefUROXime (ZINACEF)  IV    . dexmedetomidine (PRECEDEX) IV infusion Stopped (04/02/19 1529)  . famotidine (PEPCID) IV Stopped (04/02/19 1453)  . insulin 1.3 mL/hr at 04/02/19 1800  . lactated ringers 20 mL/hr at 04/02/19 1800  . lactated ringers 10 mL/hr at 04/02/19 1800  . magnesium sulfate 20 mL/hr at 04/02/19 1800  . niCARDipine    . norepinephrine (LEVOPHED) Adult infusion    . phenylephrine (NEO-SYNEPHRINE) Adult infusion 30 mcg/min (04/02/19 1800)  . vancomycin       Lab Results  Component Value Date   WBC 14.6 (H) 04/02/2019   HGB 11.2 (L) 04/02/2019   HCT 33.0 (L) 04/02/2019   PLT 152 04/02/2019   GLUCOSE 127 (H) 04/02/2019   CHOL 98 03/30/2019   TRIG 174 (H) 03/30/2019   HDL 29 (L) 03/30/2019   LDLCALC 34 03/30/2019   ALT 15 04/01/2019   AST 12 (L) 04/01/2019   NA 141 04/02/2019   K 4.2 04/02/2019   CL 105  04/02/2019   CREATININE 0.90 04/02/2019   BUN 15 04/02/2019   CO2 22 04/02/2019   TSH 3.340 03/29/2019   INR 1.3 (H) 04/02/2019   HGBA1C 5.8 (H) 03/30/2019   Now extubated  ci 2.8 -9 be - bicarbonate give Not bleeding  Grace Isaac MD  Beeper [phone] Office [phone] 04/02/2019 6:25 PM

## 2019-04-02 NOTE — Procedures (Signed)
Extubation Procedure Note  Patient Details:   Name: Jake Dunn DOB: 03-Apr-1941 MRN: [id number]   Airway Documentation:    Vent end date: 04/02/19 Vent end time: 1658   Evaluation  O2 sats: stable throughout Complications: No apparent complications Patient did tolerate procedure well. Bilateral Breath Sounds: Clear, Diminished   Yes   Pt extubated to 4L N/C.  No stridor noted.  RN @ bedside.  NIF -30, VC 1030.  Donnetta Hail 04/02/2019, 5:02 PM

## 2019-04-02 NOTE — Transfer of Care (Signed)
Immediate Anesthesia Transfer of Care Note  Patient: Jake Dunn  Procedure(s) Performed: CORONARY ARTERY BYPASS GRAFTING (CABG) using LIMA to LAD; Endoscopically harvested right greater saphenous vein to the PLB, Ramus, and Diag 2. (N/A Chest)  Patient Location: SICU  Anesthesia Type:General  Level of Consciousness: sedated and Patient remains intubated per anesthesia plan  Airway & Oxygen Therapy: Patient remains intubated per anesthesia plan and Patient placed on Ventilator (see vital sign flow sheet for setting)  Post-op Assessment: Report given to RN and Post -op Vital signs reviewed and stable  Post vital signs: Reviewed and stable  Last Vitals:  Vitals Value Taken Time  BP    Temp    Pulse    Resp    SpO2      Last Pain:  Vitals:   04/02/19 0400  TempSrc:   PainSc: 0-No pain      Patients Stated Pain Goal: 0 ([id number])  Complications: No apparent anesthesia complications

## 2019-04-02 NOTE — Brief Op Note (Signed)
03/29/2019 - 04/02/2019  8:01 AM  PATIENT:  Jake Dunn  78 y.o. male  PRE-OPERATIVE DIAGNOSIS:  UNSTABLE ANGINA  POST-OPERATIVE DIAGNOSIS:  UNSTABLE ANGINA  PROCEDURE:  Procedure(s): CORONARY ARTERY BYPASS GRAFTING (CABG) using LIMA to LAD; Endoscopically harvested right greater saphenous vein to the PLB, Ramus, and Diag 2. (N/A)  LIMA to LAD SVG TO RAMUS  SVG to Diag 2 SVG to PL  SURGEON:  Surgeon(s) and Role:    * Lightfoot, Lucile Crater, MD - Primary    * Grace Isaac, MD - Assisting  PHYSICIAN ASSISTANT:  Nicholes Rough, PA-C   ANESTHESIA:   general  EBL:  950 mL   BLOOD ADMINISTERED:none  DRAINS: ROUTINE   LOCAL MEDICATIONS USED:  NONE  SPECIMEN:  No Specimen  DISPOSITION OF SPECIMEN:  N/A  COUNTS:  YES  DICTATION: .Dragon Dictation  PLAN OF CARE: Admit to inpatient   PATIENT DISPOSITION:  ICU - intubated and hemodynamically stable.   Delay start of Pharmacological VTE agent (>24hrs) due to surgical blood loss or risk of bleeding: yes

## 2019-04-02 NOTE — Op Note (Signed)
[address]       [place] [address]             [phone]                                          04/02/2019 Patient:  Jake Dunn Pre-Op Dx: 3V CAD   Unstable angina   Post-op Dx:  same Procedure: CABG X 4.  LIMA LAD, RSVG PLV, Ramus, and D2  (T graft off the ramus vein graft) Endoscopic greater saphenous vein harvest on the right Intra-operative Transesophageal Echocardiogram  Surgeon and Role:      * Aerial Dilley, Lucile Crater, MD - Primary    * Dr. Servando Snare, MD - assisting Assistant: T. Harriet Pho, PA-C  Anesthesia  general EBL:  500 ml Blood Administration: none Xclamp Time:  59 min Pump Time:  115min  Drains: 23 F blake drain:  R, L, mediastinal  Wires: none Counts: correct   Indications: 78 yo male admitted from clinic with unstable angina.  He had previously had a LHC which showed 3V CAD.  He was brought to the OR for surgical revascularization.  Findings: Good conduit.  Calcified, intramyocardia LAD.  Intramyocardial ramus.  Good flows on all vein grafts  Operative Technique: All invasive lines were placed in pre-op holding.  After the risks, benefits and alternatives were thoroughly discussed, the patient was brought to the operative theatre.  Anesthesia was induced, and the patient was prepped and draped in normal sterile fashion.  An appropriate surgical pause was performed, and pre-operative antibiotics were dosed accordingly.  We began with simultaneous incisions were made along the right leg for harvesting of the greater saphenous vein and the chest for the sternotomy.  In regards to the sternotomy, this was carried down with bovie cautery, and the sternum was divided with a reciprocating saw.  Meticulous hemostasis was obtained.  The left internal thoracic artery was exposed and harvested in in pedicled fashion.  The patient was systemically heparinized, and the artery was divided distally, and placed in a papaverine sponge.    The  sternal elevator was removed, and a retractor was placed.  The pericardium was divided in the midline and fashioned into a cradle with pericardial stitches.   After we confirmed an appropriate ACT, the ascending aorta was cannulated in standard fashion.  The right atrial appendage was used for venous cannulation site.  Cardiopulmonary bypass was initiated, and the heart retractor was placed. The cross clamp was applied, and a dose of anterograde cardioplegia was given with good arrest of the heart.  We moved to the posterior wall of the heart, and found a good target on the PL.  An arteriotomy was made, and the vein graft was anastomosed to it in an end to side fashion.  Next we exposed the lateral wall, and found a good target on the ramus branch.  An end to side anastomosis with the vein graft was then created.  Next, we exposed the anterior wall of the heart and identified a good target on 2nd diagonal.   An arteriotomy was created.  The vein was anastomosed in an end to side fashion.  Finally, we exposed a good target on the LAD, and fashioned an end to side anastomosis between it and the LITA.  We began to re-warm, and a re-animation dose of cardioplegia was  given.  The heart was de-aired, and the cross clamp was removed.  Meticulous hemostasis was obtained.    A partial occludding clamp was then placed on the ascending aorta, and we created an end to side anastomosis between it and the proximal vein grafts.  The vein graft of the diagonal branch was jumped off the hood of the ramus branch.  The proximal sites were marked with rings.  Hemostasis was obtained, and we separated from cardiopulmonary bypass without event.the heparin was reversed with protamine.  Chest tubes and wires were placed, and the sternum was re-approximated with with sternal wires.  The soft tissue and skin were re-approximated wth absorbable suture.    The patient tolerated the procedure without any immediate complications, and was  transferred to the ICU in guarded condition.  Jake Dunn

## 2019-04-02 NOTE — Anesthesia Postprocedure Evaluation (Signed)
Anesthesia Post Note  Patient: Jake Dunn  Procedure(s) Performed: CORONARY ARTERY BYPASS GRAFTING (CABG) using LIMA to LAD; Endoscopically harvested right greater saphenous vein to the PLB, Ramus, and Diag 2. (N/A Chest)     Patient location during evaluation: SICU Anesthesia Type: General Level of consciousness: sedated Pain management: pain level controlled Vital Signs Assessment: post-procedure vital signs reviewed and stable Respiratory status: patient remains intubated per anesthesia plan Cardiovascular status: stable Postop Assessment: no apparent nausea or vomiting Anesthetic complications: no    Last Vitals:  Vitals:   04/02/19 1445 04/02/19 1500  BP: 110/72 117/74  Pulse:    Resp: 15 16  Temp: 37.2 C 37.3 C  SpO2: 97% 97%    Last Pain:  Vitals:   04/02/19 0400  TempSrc:   PainSc: 0-No pain                 Ryan P Ellender

## 2019-04-03 ENCOUNTER — Inpatient Hospital Stay (HOSPITAL_COMMUNITY): Payer: Medicare HMO

## 2019-04-03 ENCOUNTER — Encounter (HOSPITAL_COMMUNITY): Payer: Self-pay | Admitting: Thoracic Surgery (Cardiothoracic Vascular Surgery)

## 2019-04-03 DIAGNOSIS — I25110 Atherosclerotic heart disease of native coronary artery with unstable angina pectoris: Principal | ICD-10-CM

## 2019-04-03 LAB — CBC
HCT: 30.4 % — ABNORMAL LOW (ref 39.0–52.0)
HCT: 30 % — ABNORMAL LOW (ref 39.0–52.0)
Hemoglobin: 10.2 g/dL — ABNORMAL LOW (ref 13.0–17.0)
Hemoglobin: 9.7 g/dL — ABNORMAL LOW (ref 13.0–17.0)
MCH: 31.7 pg (ref 26.0–34.0)
MCH: 31.2 pg (ref 26.0–34.0)
MCHC: 33.6 g/dL (ref 30.0–36.0)
MCHC: 32.3 g/dL (ref 30.0–36.0)
MCV: 94.4 fL (ref 80.0–100.0)
MCV: 96.5 fL (ref 80.0–100.0)
Platelets: 121 10*3/uL — ABNORMAL LOW (ref 150–400)
Platelets: 123 10*3/uL — ABNORMAL LOW (ref 150–400)
RBC: 3.22 MIL/uL — ABNORMAL LOW (ref 4.22–5.81)
RBC: 3.11 MIL/uL — ABNORMAL LOW (ref 4.22–5.81)
RDW: 13.1 % (ref 11.5–15.5)
RDW: 13.2 % (ref 11.5–15.5)
WBC: 10 10*3/uL (ref 4.0–10.5)
WBC: 10.2 10*3/uL (ref 4.0–10.5)
nRBC: 0 % (ref 0.0–0.2)
nRBC: 0 % (ref 0.0–0.2)

## 2019-04-03 LAB — GLUCOSE, CAPILLARY
Glucose-Capillary: 113 mg/dL — ABNORMAL HIGH (ref 70–99)
Glucose-Capillary: 114 mg/dL — ABNORMAL HIGH (ref 70–99)
Glucose-Capillary: 118 mg/dL — ABNORMAL HIGH (ref 70–99)
Glucose-Capillary: 121 mg/dL — ABNORMAL HIGH (ref 70–99)
Glucose-Capillary: 124 mg/dL — ABNORMAL HIGH (ref 70–99)
Glucose-Capillary: 127 mg/dL — ABNORMAL HIGH (ref 70–99)
Glucose-Capillary: 129 mg/dL — ABNORMAL HIGH (ref 70–99)
Glucose-Capillary: 133 mg/dL — ABNORMAL HIGH (ref 70–99)
Glucose-Capillary: 135 mg/dL — ABNORMAL HIGH (ref 70–99)
Glucose-Capillary: 77 mg/dL (ref 70–99)

## 2019-04-03 LAB — BASIC METABOLIC PANEL
Anion gap: 8 (ref 5–15)
Anion gap: 9 (ref 5–15)
BUN: 14 mg/dL (ref 8–23)
BUN: 17 mg/dL (ref 8–23)
CO2: 22 mmol/L (ref 22–32)
CO2: 24 mmol/L (ref 22–32)
Calcium: 8.1 mg/dL — ABNORMAL LOW (ref 8.9–10.3)
Calcium: 8.1 mg/dL — ABNORMAL LOW (ref 8.9–10.3)
Chloride: 108 mmol/L (ref 98–111)
Chloride: 103 mmol/L (ref 98–111)
Creatinine, Ser: 1.18 mg/dL (ref 0.61–1.24)
Creatinine, Ser: 1.16 mg/dL (ref 0.61–1.24)
GFR calc Af Amer: >60 mL/min (ref >60)
GFR calc Af Amer: >60 mL/min (ref >60)
GFR calc non Af Amer: 60 mL/min — ABNORMAL LOW (ref >60)
GFR calc non Af Amer: >60 mL/min (ref >60)
Glucose, Bld: 146 mg/dL — ABNORMAL HIGH (ref 70–99)
Glucose, Bld: 119 mg/dL — ABNORMAL HIGH (ref 70–99)
Potassium: 4.4 mmol/L (ref 3.5–5.1)
Potassium: 3.8 mmol/L (ref 3.5–5.1)
Sodium: 138 mmol/L (ref 135–145)
Sodium: 136 mmol/L (ref 135–145)

## 2019-04-03 LAB — MAGNESIUM
Magnesium: 2.6 mg/dL — ABNORMAL HIGH (ref 1.7–2.4)
Magnesium: 2.2 mg/dL (ref 1.7–2.4)

## 2019-04-03 MED ORDER — METOPROLOL TARTRATE 25 MG/10 ML ORAL SUSPENSION
12.5000 mg | Freq: Two times a day (BID) | ORAL | Status: DC
  Filled 2019-04-03 (×8): qty 5

## 2019-04-03 MED ORDER — POTASSIUM CHLORIDE CRYS ER 20 MEQ PO TBCR
40.0000 meq | EXTENDED_RELEASE_TABLET | Freq: Once | ORAL | Status: AC
  Administered 2019-04-03: 20:00:00 40 meq via ORAL
  Filled 2019-04-03: qty 2

## 2019-04-03 MED ORDER — FUROSEMIDE 10 MG/ML IJ SOLN
40.0000 mg | Freq: Once | INTRAMUSCULAR | Status: AC
  Administered 2019-04-03: 40 mg via INTRAVENOUS
  Filled 2019-04-03: qty 4

## 2019-04-03 MED ORDER — INSULIN ASPART 100 UNIT/ML ~~LOC~~ SOLN
0.0000 [IU] | SUBCUTANEOUS | Status: DC
  Administered 2019-04-03: 14:00:00 2 [IU] via SUBCUTANEOUS

## 2019-04-03 MED ORDER — METOPROLOL TARTRATE 25 MG PO TABS
25.0000 mg | ORAL_TABLET | Freq: Two times a day (BID) | ORAL | Status: DC
  Administered 2019-04-03 – 2019-04-08 (×11): 25 mg via ORAL
  Filled 2019-04-03 (×11): qty 1

## 2019-04-03 NOTE — Progress Notes (Addendum)
Progress Note  Patient Name: Jake Dunn Date of Encounter: 04/03/2019  Primary Cardiologist: Peter Martinique, MD   Subjective   Jake Dunn reports that he is feeling okay, reports some chest pain, at a 4/10 right now. Denies any SOB.   Inpatient Medications    Scheduled Meds:  acetaminophen  1,000 mg Oral Q6H   Or   acetaminophen (TYLENOL) oral liquid 160 mg/5 mL  1,000 mg Per Tube Q6H   aspirin EC  325 mg Oral Daily   Or   aspirin  324 mg Per Tube Daily   atorvastatin  80 mg Oral QPM   bisacodyl  10 mg Oral Daily   Or   bisacodyl  10 mg Rectal Daily   Chlorhexidine Gluconate Cloth  6 each Topical Daily   docusate sodium  200 mg Oral Daily   furosemide  40 mg Intravenous Once   insulin aspart  0-24 Units Subcutaneous Q4H   mouth rinse  15 mL Mouth Rinse BID   metoprolol tartrate  25 mg Oral BID   Or   metoprolol tartrate  12.5 mg Per Tube BID   [START ON 04/04/2019] pantoprazole  40 mg Oral Daily   sodium chloride flush  10-40 mL Intracatheter Q12H   sodium chloride flush  3 mL Intravenous Q12H   Continuous Infusions:  sodium chloride 20 mL/hr at 04/03/19 0500   sodium chloride     sodium chloride 20 mL/hr at 04/02/19 1353   albumin human 12.5 g (04/02/19 1519)   cefUROXime (ZINACEF)  IV Stopped (04/02/19 2050)   dexmedetomidine (PRECEDEX) IV infusion Stopped (04/02/19 1529)   insulin 1.1 mL/hr at 04/03/19 0500   lactated ringers 20 mL/hr at 04/03/19 0500   lactated ringers 10 mL/hr at 04/03/19 0500   niCARDipine     norepinephrine (LEVOPHED) Adult infusion     phenylephrine (NEO-SYNEPHRINE) Adult infusion Stopped (04/02/19 2353)   PRN Meds: sodium chloride, albumin human, ALPRAZolam, dextrose, metoprolol tartrate, midazolam, morphine injection, ondansetron (ZOFRAN) IV, oxyCODONE, sodium chloride flush, sodium chloride flush, traMADol   Vital Signs    Vitals:   04/03/19 0300 04/03/19 0400 04/03/19 0500 04/03/19 0600  BP:  112/66 99/62 109/61   Pulse:  85    Resp: 18 14 (!) 23 20  Temp: 99.5 F (37.5 C) 99.3 F (37.4 C) 99.1 F (37.3 C) 99.5 F (37.5 C)  TempSrc:  Core    SpO2: 93% 94% 93% 93%  Weight:    103.9 kg  Height:        Intake/Output Summary (Last 24 hours) at 04/03/2019 0727 Last data filed at 04/03/2019 0500 Gross per 24 hour  Intake 4961.8 ml  Output 4265 ml  Net 696.8 ml   Filed Weights   04/01/19 0500 04/02/19 0500 04/03/19 0600  Weight: 102.7 kg 102.6 kg 103.9 kg    Telemetry    NSR, occasional PVCs - Personally Reviewed  ECG    NSR, HR 80s, no ST changes - Personally Reviewed  Physical Exam   GEN: No acute distress.   Neck: No JVD Cardiac: RRR, no murmurs, rubs, or gallops.  Respiratory: Clear to auscultation bilaterally. GI: Soft, nontender, non-distended  MS: No edema; No deformity. Substernal chest wound packing in place. Chest tube in place.  Neuro:  Nonfocal  Psych: Normal affect   Labs    Chemistry Recent Labs  Lab 03/29/19 2109  04/01/19 0455 04/02/19 0420  04/02/19 1253  04/02/19 1808 04/02/19 1851 04/03/19 0253  NA  --    < >  138 137   < > 137   < > 141 139 138  K  --    < > 3.7 3.8   < > 4.6   < > 4.2 4.4 4.4  CL  --    < > 105 107   < > 105  --   --  111 108  CO2  --    < > 24 22  --   --   --   --  19* 22  GLUCOSE  --    < > 101* 104*   < > 127*  --   --  153* 146*  BUN  --    < > 18 16   < > 15  --   --  16 14  CREATININE  --    < > 1.31* 1.05   < > 0.90  --   --  1.18 1.18  CALCIUM  --    < > 9.1 9.2  --   --   --   --  8.1* 8.1*  PROT 7.1  --  6.7  --   --   --   --   --   --   --   ALBUMIN 3.9  --  3.7  --   --   --   --   --   --   --   AST 14*  --  12*  --   --   --   --   --   --   --   ALT 15  --  15  --   --   --   --   --   --   --   ALKPHOS 83  --  79  --   --   --   --   --   --   --   BILITOT 0.7  --  0.5  --   --   --   --   --   --   --   GFRNONAA  --    < > 53* >60  --   --   --   --  60* 60*  GFRAA  --    < > >60 >60   --   --   --   --  >60 >60  ANIONGAP  --    < > 9 8  --   --   --   --  9 8   < > = values in this interval not displayed.     Hematology Recent Labs  Lab 04/02/19 1403  04/02/19 1808 04/02/19 1851 04/03/19 0253  WBC 14.6*  --   --  16.6* 10.0  RBC 3.97*  --   --  3.54* 3.22*  HGB 12.4*   < > 11.2* 11.0* 10.2*  HCT 38.3*   < > 33.0* 34.7* 30.4*  MCV 96.5  --   --  98.0 94.4  MCH 31.2  --   --  31.1 31.7  MCHC 32.4  --   --  31.7 33.6  RDW 12.7  --   --  12.9 13.1  PLT 152  --   --  154 121*   < > = values in this interval not displayed.    Cardiac EnzymesNo results for input(s): TROPONINI in the last 168 hours. No results for input(s): TROPIPOC in the last 168 hours.   BNP Recent Labs  Lab 03/29/19 2109  BNP 50.2  DDimer No results for input(s): DDIMER in the last 168 hours.   Radiology    Dg Chest Port 1 View  Result Date: 04/03/2019 CLINICAL DATA:  Post CABG EXAM: PORTABLE CHEST 1 VIEW COMPARISON:  Radiograph 04/02/2019 FINDINGS: Mediastinal and pleural drains remain in place. A right IJ approach Swan-Ganz catheter terminates in the region of main pulmonary arteries. Postsurgical changes related to prior CABG including intact and aligned sternotomy wires and multiple surgical clips projecting over the mediastinum. Stable postoperative mediastinal widening. Persistent bandlike opacities in the right lung base likely reflect subsegmental atelectasis with more hazy opacities throughout the lungs likely reflecting some slight residual edema. No pneumothorax. No sizable effusion. Right shoulder reverse arthroplasty and multilevel cervical and cervicothoracic fusion hardware is seen. No acute osseous abnormality. IMPRESSION: 1. Postsurgical changes related to prior CABG. Stable postoperative mediastinal widening. 2. Persistent bandlike opacities in the right lung base likely reflect subsegmental atelectasis with more hazy opacities throughout the lungs likely reflecting some  slight residual edema. Electronically Signed   By: Lovena Le M.D.   On: 04/03/2019 06:15   Dg Chest Port 1 View  Result Date: 04/02/2019 CLINICAL DATA:  CABG. EXAM: PORTABLE CHEST 1 VIEW COMPARISON:  Chest x-ray dated March 29, 2019. FINDINGS: Endotracheal tube in position with the tip 5.7 cm above the level of the carina. Enteric tube entering the stomach with the tip below the field of view. Right internal jugular Swan-Ganz catheter with the tip in the main pulmonary outflow tract. Mediastinal and bilateral chest tubes are in good position. Interval CABG. Normal heart size. Low lung volumes. Normal pulmonary vascularity. Right greater than left bibasilar atelectasis. No large pleural effusion. No pneumothorax. No acute osseous abnormality. IMPRESSION: 1. Appropriately positioned support apparatus. 2. Interval CABG.  Bibasilar atelectasis.  No pneumothorax. Electronically Signed   By: Titus Dubin M.D.   On: 04/02/2019 14:26    Cardiac Studies   03/30/2019 Echocardiogram:   IMPRESSIONS    1. Left ventricular ejection fraction, by visual estimation, is 55 to 60%. The left ventricle has normal function. There is no left ventricular hypertrophy.  2. Global right ventricle has mildly reduced systolic function.The right ventricular size is mildly enlarged. No increase in right ventricular wall thickness.  3. Left atrial size was normal.  4. Right atrial size was mildly dilated.  5. The mitral valve is normal in structure. No evidence of mitral valve regurgitation.  6. The tricuspid valve is normal in structure. Tricuspid valve regurgitation is trivial.  7. The aortic valve is normal in structure. Aortic valve regurgitation is not visualized. No evidence of aortic valve sclerosis or stenosis.  8. The pulmonic valve was grossly normal. Pulmonic valve regurgitation is not visualized.  9. Aortic dilatation noted. 10. There is mild dilatation of the ascending aorta measuring 38 mm. 11.  Normal pulmonary artery systolic pressure. 12. The atrial septum is grossly normal.  03/26/2019 Cardiac catheterization:  Prox RCA lesion is 45% stenosed.  2nd RPL lesion is 80% stenosed.  Ost LM to Mid LM lesion is 50% stenosed.  Ost Cx to Mid Cx lesion is 80% stenosed.  Ramus lesion is 90% stenosed.  Mid LAD lesion is 70% stenosed.  2nd Diag lesion is 60% stenosed.  The left ventricular systolic function is normal.  LV end diastolic pressure is normal.  The left ventricular ejection fraction is 55-65% by visual estimate.   1. Three vessel obstructive, Calcific CAD 2. Normal LV function 3. Normal LVEDP  Plan: recommend referral to  CT surgery for CABG. Will stop Plavix.  Patient Profile     78 y.o. male with a history of CAD, GERD, HTN, HLD, renal cell CA s/p left nephrectomy and recent cardiac cath for chest pain on 11/17 that showed 3 vessel disease presented for CABG.  Assessment & Plan    1. CAD/Unstable angina: Patient initially presented for worsening shortness of breath and chest pain, under cardiac catherization on 11/17 that showed 3 vessel disease. Troponins were negative. Echo showed EF of 55-60%. Initially set up for outpatient CABG however presented to [place] office with chest pain, admitted directly for CABG. S/p CABG x4 on 04/02/19, LIMA LAD, RSVG to PLV, Ramus and D2. Overall he reports feeling well today,some chest pain with deep breaths, still has chest tubes in place. Did not appear to have an ACS so would hold off on DAPT, continue ASA.  -Currently on ASA, atorvastatin, and metoprolol -TCTS managing  2. HTN: BP ranging around 100/70s. Currently on metoprolol 25 mg BID, with hold parameters in place.   3. HLD: Continue atorvastatin 80 mg daily.   For questions or updates, please contact [place] Please consult [email or web address] for contact info under Cardiology/STEMI.      Signed, Asencion Noble, MD  04/03/2019, 7:27 AM    I have personally  seen and examined this patient with Dr. Sherry Ruffing. I agree with the assessment and plan as outlined above. He is doing well post CABG. Hemodynamically stable. Sinus on tele. Continue ASA, statin and beta blocker.   Lauree Chandler 04/03/2019 9:57 AM

## 2019-04-03 NOTE — Progress Notes (Signed)
Patient ID: Jake Dunn, male   DOB: 01-31-1941, 78 y.o.   MRN: [id number] TCTS Evening Rounds:  Hemodynamically stable in sinus rhythm.  Good urine output today  BMET    Component Value Date/Time   NA 136 04/03/2019 1522   NA 140 03/20/2019 0851   K 3.8 04/03/2019 1522   CL 103 04/03/2019 1522   CO2 24 04/03/2019 1522   GLUCOSE 119 (H) 04/03/2019 1522   BUN 17 04/03/2019 1522   BUN 11 03/20/2019 0851   CREATININE 1.16 04/03/2019 1522        CALCIUM 8.1 (L) 04/03/2019 1522   GFRNONAA >60 04/03/2019 1522   GFRAA >60 04/03/2019 1522   Chest tube output low.

## 2019-04-04 ENCOUNTER — Inpatient Hospital Stay (HOSPITAL_COMMUNITY): Payer: Medicare HMO

## 2019-04-04 LAB — BPAM RBC
Blood Product Expiration Date: 202012212359
Blood Product Expiration Date: 202012212359
Unit Type and Rh: 6200
Unit Type and Rh: 6200

## 2019-04-04 LAB — CBC
HCT: 27.8 % — ABNORMAL LOW (ref 39.0–52.0)
Hemoglobin: 8.8 g/dL — ABNORMAL LOW (ref 13.0–17.0)
MCH: 31.3 pg (ref 26.0–34.0)
MCHC: 31.7 g/dL (ref 30.0–36.0)
MCV: 98.9 fL (ref 80.0–100.0)
Platelets: 116 10*3/uL — ABNORMAL LOW (ref 150–400)
RBC: 2.81 MIL/uL — ABNORMAL LOW (ref 4.22–5.81)
RDW: 13.4 % (ref 11.5–15.5)
WBC: 9.2 10*3/uL (ref 4.0–10.5)
nRBC: 0 % (ref 0.0–0.2)

## 2019-04-04 LAB — BASIC METABOLIC PANEL
Anion gap: 5 (ref 5–15)
BUN: 20 mg/dL (ref 8–23)
CO2: 25 mmol/L (ref 22–32)
Calcium: 8 mg/dL — ABNORMAL LOW (ref 8.9–10.3)
Chloride: 104 mmol/L (ref 98–111)
Creatinine, Ser: 1.32 mg/dL — ABNORMAL HIGH (ref 0.61–1.24)
GFR calc Af Amer: >60 mL/min (ref >60)
GFR calc non Af Amer: 52 mL/min — ABNORMAL LOW (ref >60)
Glucose, Bld: 111 mg/dL — ABNORMAL HIGH (ref 70–99)
Potassium: 4.3 mmol/L (ref 3.5–5.1)
Sodium: 134 mmol/L — ABNORMAL LOW (ref 135–145)

## 2019-04-04 LAB — TYPE AND SCREEN
ABO/RH(D): A POS
Antibody Screen: NEGATIVE
Unit division: 0
Unit division: 0

## 2019-04-04 LAB — GLUCOSE, CAPILLARY
Glucose-Capillary: 107 mg/dL — ABNORMAL HIGH (ref 70–99)
Glucose-Capillary: 119 mg/dL — ABNORMAL HIGH (ref 70–99)
Glucose-Capillary: 103 mg/dL — ABNORMAL HIGH (ref 70–99)
Glucose-Capillary: 76 mg/dL (ref 70–99)
Glucose-Capillary: 89 mg/dL (ref 70–99)

## 2019-04-04 MED ORDER — ~~LOC~~ CARDIAC SURGERY, PATIENT & FAMILY EDUCATION
Freq: Once | Status: AC
  Administered 2019-04-04: 12:00:00

## 2019-04-04 MED ORDER — SODIUM CHLORIDE 0.9% FLUSH
3.0000 mL | Freq: Two times a day (BID) | INTRAVENOUS | Status: DC
  Administered 2019-04-04 (×3): 3 mL via INTRAVENOUS

## 2019-04-04 MED ORDER — ASPIRIN 325 MG PO TABS
325.0000 mg | ORAL_TABLET | Freq: Every day | ORAL | Status: DC
  Administered 2019-04-04 – 2019-04-08 (×5): 325 mg via ORAL
  Filled 2019-04-04 (×5): qty 1

## 2019-04-04 MED ORDER — SODIUM CHLORIDE 0.9% FLUSH: 3.0000 mL | INTRAVENOUS | Status: DC | PRN

## 2019-04-04 MED ORDER — SODIUM CHLORIDE 0.9 % IV SOLN: 250.0000 mL | INTRAVENOUS | Status: DC | PRN

## 2019-04-04 MED ORDER — ASPIRIN 81 MG PO CHEW: 81.0000 mg | CHEWABLE_TABLET | Freq: Every day | ORAL | Status: DC

## 2019-04-04 NOTE — Progress Notes (Signed)
Progress Note  Patient Name: Jake Dunn Date of Encounter: 04/04/2019  Primary Cardiologist: Peter Martinique, MD   Subjective   Doing well. No events overnight.   Inpatient Medications    Scheduled Meds: . acetaminophen  1,000 mg Oral Q6H   Or  . acetaminophen (TYLENOL) oral liquid 160 mg/5 mL  1,000 mg Per Tube Q6H  . aspirin EC  325 mg Oral Daily   Or  . aspirin  324 mg Per Tube Daily  . atorvastatin  80 mg Oral QPM  . bisacodyl  10 mg Oral Daily   Or  . bisacodyl  10 mg Rectal Daily  . Chlorhexidine Gluconate Cloth  6 each Topical Daily  . docusate sodium  200 mg Oral Daily  . insulin aspart  0-24 Units Subcutaneous Q4H  . mouth rinse  15 mL Mouth Rinse BID  . metoprolol tartrate  25 mg Oral BID   Or  . metoprolol tartrate  12.5 mg Per Tube BID  . pantoprazole  40 mg Oral Daily  . sodium chloride flush  10-40 mL Intracatheter Q12H  . sodium chloride flush  3 mL Intravenous Q12H   Continuous Infusions: . sodium chloride Stopped (04/03/19 0954)  . sodium chloride    . sodium chloride 20 mL/hr at 04/02/19 1353  . cefUROXime (ZINACEF)  IV Stopped (04/03/19 2023)  . dexmedetomidine (PRECEDEX) IV infusion Stopped (04/02/19 1529)  . insulin Stopped (04/03/19 1405)  . lactated ringers Stopped (04/03/19 0514)  . lactated ringers 20 mL/hr at 04/04/19 0600  . niCARDipine    . norepinephrine (LEVOPHED) Adult infusion    . phenylephrine (NEO-SYNEPHRINE) Adult infusion Stopped (04/02/19 2353)   PRN Meds: sodium chloride, ALPRAZolam, dextrose, metoprolol tartrate, midazolam, morphine injection, ondansetron (ZOFRAN) IV, oxyCODONE, sodium chloride flush, sodium chloride flush, traMADol   Vital Signs    Vitals:   04/04/19 0400 04/04/19 0500 04/04/19 0504 04/04/19 0600  BP: 98/61  121/70 108/63  Pulse: 68     Resp: 10  10 12   Temp:      TempSrc:      SpO2: 99%  97% 98%  Weight:  108.3 kg    Height:        Intake/Output Summary (Last 24 hours) at 04/04/2019 0751  Last data filed at 04/04/2019 0600 Gross per 24 hour  Intake 1008.93 ml  Output 1090 ml  Net -81.07 ml   Filed Weights   04/02/19 0500 04/03/19 0600 04/04/19 0500  Weight: 102.6 kg 103.9 kg 108.3 kg    Telemetry    Sinus - Personally Reviewed  ECG    No AM EKG - Personally Reviewed  Physical Exam    General: Well developed, well nourished, NAD  HEENT: OP clear, mucus membranes moist  SKIN: warm, dry. No rashes. Neuro: No focal deficits  Musculoskeletal: Muscle strength 5/5 all ext  Psychiatric: Mood and affect normal  Neck: No JVD, no carotid bruits, no thyromegaly, no lymphadenopathy.  Lungs:Clear bilaterally, no wheezes, rhonci, crackles Cardiovascular: Regular rate and rhythm. No murmurs, gallops or rubs. Abdomen:Soft. Bowel sounds present. Non-tender.  Extremities: No lower extremity edema. Pulses are 2 + in the bilateral DP/PT.    Labs    Chemistry Recent Labs  Lab 03/29/19 2109  04/01/19 0455  04/03/19 0253 04/03/19 1522 04/04/19 0441  NA  --    < > 138   < > 138 136 134*  K  --    < > 3.7   < > 4.4 3.8 4.3  CL  --    < > 105   < > 108 103 104  CO2  --    < > 24   < > 22 24 25   GLUCOSE  --    < > 101*   < > 146* 119* 111*  BUN  --    < > 18   < > 14 17 20   CREATININE  --    < > 1.31*   < > 1.18 1.16 1.32*  CALCIUM  --    < > 9.1   < > 8.1* 8.1* 8.0*  PROT 7.1  --  6.7  --   --   --   --   ALBUMIN 3.9  --  3.7  --   --   --   --   AST 14*  --  12*  --   --   --   --   ALT 15  --  15  --   --   --   --   ALKPHOS 83  --  79  --   --   --   --   BILITOT 0.7  --  0.5  --   --   --   --   GFRNONAA  --    < > 53*   < > 60* >60 52*  GFRAA  --    < > >60   < > >60 >60 >60  ANIONGAP  --    < > 9   < > 8 9 5    < > = values in this interval not displayed.     Hematology Recent Labs  Lab 04/03/19 0253 04/03/19 1522 04/04/19 0441  WBC 10.0 10.2 9.2  RBC 3.22* 3.11* 2.81*  HGB 10.2* 9.7* 8.8*  HCT 30.4* 30.0* 27.8*  MCV 94.4 96.5 98.9  MCH 31.7  31.2 31.3  MCHC 33.6 32.3 31.7  RDW 13.1 13.2 13.4  PLT 121* 123* 116*    Cardiac EnzymesNo results for input(s): TROPONINI in the last 168 hours. No results for input(s): TROPIPOC in the last 168 hours.   BNP Recent Labs  Lab 03/29/19 2109  BNP 50.2     DDimer No results for input(s): DDIMER in the last 168 hours.   Radiology    Dg Chest Port 1 View  Result Date: 04/03/2019 CLINICAL DATA:  Post CABG EXAM: PORTABLE CHEST 1 VIEW COMPARISON:  Radiograph 04/02/2019 FINDINGS: Mediastinal and pleural drains remain in place. A right IJ approach Swan-Ganz catheter terminates in the region of main pulmonary arteries. Postsurgical changes related to prior CABG including intact and aligned sternotomy wires and multiple surgical clips projecting over the mediastinum. Stable postoperative mediastinal widening. Persistent bandlike opacities in the right lung base likely reflect subsegmental atelectasis with more hazy opacities throughout the lungs likely reflecting some slight residual edema. No pneumothorax. No sizable effusion. Right shoulder reverse arthroplasty and multilevel cervical and cervicothoracic fusion hardware is seen. No acute osseous abnormality. IMPRESSION: 1. Postsurgical changes related to prior CABG. Stable postoperative mediastinal widening. 2. Persistent bandlike opacities in the right lung base likely reflect subsegmental atelectasis with more hazy opacities throughout the lungs likely reflecting some slight residual edema. Electronically Signed   By: Lovena Le M.D.   On: 04/03/2019 06:15   Dg Chest Port 1 View  Result Date: 04/02/2019 CLINICAL DATA:  CABG. EXAM: PORTABLE CHEST 1 VIEW COMPARISON:  Chest x-ray dated March 29, 2019. FINDINGS: Endotracheal tube in position with the tip 5.7  cm above the level of the carina. Enteric tube entering the stomach with the tip below the field of view. Right internal jugular Swan-Ganz catheter with the tip in the main pulmonary outflow  tract. Mediastinal and bilateral chest tubes are in good position. Interval CABG. Normal heart size. Low lung volumes. Normal pulmonary vascularity. Right greater than left bibasilar atelectasis. No large pleural effusion. No pneumothorax. No acute osseous abnormality. IMPRESSION: 1. Appropriately positioned support apparatus. 2. Interval CABG.  Bibasilar atelectasis.  No pneumothorax. Electronically Signed   By: Titus Dubin M.D.   On: 04/02/2019 14:26    Cardiac Studies   03/30/2019 Echocardiogram:   IMPRESSIONS    1. Left ventricular ejection fraction, by visual estimation, is 55 to 60%. The left ventricle has normal function. There is no left ventricular hypertrophy.  2. Global right ventricle has mildly reduced systolic function.The right ventricular size is mildly enlarged. No increase in right ventricular wall thickness.  3. Left atrial size was normal.  4. Right atrial size was mildly dilated.  5. The mitral valve is normal in structure. No evidence of mitral valve regurgitation.  6. The tricuspid valve is normal in structure. Tricuspid valve regurgitation is trivial.  7. The aortic valve is normal in structure. Aortic valve regurgitation is not visualized. No evidence of aortic valve sclerosis or stenosis.  8. The pulmonic valve was grossly normal. Pulmonic valve regurgitation is not visualized.  9. Aortic dilatation noted. 10. There is mild dilatation of the ascending aorta measuring 38 mm. 11. Normal pulmonary artery systolic pressure. 12. The atrial septum is grossly normal.  03/26/2019 Cardiac catheterization:  Prox RCA lesion is 45% stenosed.  2nd RPL lesion is 80% stenosed.  Ost LM to Mid LM lesion is 50% stenosed.  Ost Cx to Mid Cx lesion is 80% stenosed.  Ramus lesion is 90% stenosed.  Mid LAD lesion is 70% stenosed.  2nd Diag lesion is 60% stenosed.  The left ventricular systolic function is normal.  LV end diastolic pressure is normal.  The left  ventricular ejection fraction is 55-65% by visual estimate.   1. Three vessel obstructive, Calcific CAD 2. Normal LV function 3. Normal LVEDP  Plan: recommend referral to CT surgery for CABG. Will stop Plavix.  Patient Profile     78 y.o. male with a history of CAD, GERD, HTN, HLD, renal cell CA s/p left nephrectomy and recent cardiac cath for chest pain on 11/17 that showed 3 vessel disease presented for CABG.  Assessment & Plan    1. CAD/Unstable angina: Doing well post CABG. No events overnight. Continues to progress. Continue ASA, statin and beta blocker.   For questions or updates, please contact [place] Please consult [email or web address] for contact info under Cardiology/STEMI.      Signed, Lauree Chandler, MD  04/04/2019, 7:51 AM

## 2019-04-04 NOTE — Progress Notes (Signed)
      [address]       [place] [address]             [phone]                 2 Days Post-Op Procedure(s) (LRB): CORONARY ARTERY BYPASS GRAFTING (CABG) using LIMA to LAD; Endoscopically harvested right greater saphenous vein to the PLB, Ramus, and Diag 2. (N/A)   Events: No events.  ambulating _______________________________________________________________ Vitals: BP 119/71   Pulse 68   Temp 98 F (36.7 C) (Oral)   Resp 16   Ht 5\' 7"  (1.702 m)   Wt 108.3 kg   SpO2 98%   BMI 37.39 kg/m   - Neuro: pain well controlled  - Cardiovascular: sinus,  Drips: none.      - Pulm: on 4L.  EWOB    ABG    Component Value Date/Time   PHART 7.286 (L) 04/02/2019 1808   PCO2ART 35.6 04/02/2019 1808   PO2ART 72.0 (L) 04/02/2019 1808   HCO3 16.9 (L) 04/02/2019 1808   TCO2 18 (L) 04/02/2019 1808   ACIDBASEDEF 9.0 (H) 04/02/2019 1808   O2SAT 92.0 04/02/2019 1808    - Abd: soft - Extremity: warm, trace edema  .Intake/Output      11/25 0701 - 11/26 0700 11/26 0701 - 11/27 0700   I.V. (mL/kg) 808.9 (7.5)    Blood     IV Piggyback 200.1    Total Intake(mL/kg) 1008.9 (9.3)    Urine (mL/kg/hr) 950 (0.4)    Blood     Chest Tube 140 30   Total Output 1090 30   Net -81.1 -30           _______________________________________________________________ Labs: CBC Latest Ref Rng & Units 04/04/2019 04/03/2019 04/03/2019  WBC 4.0 - 10.5 K/uL 9.2 10.2 10.0  Hemoglobin 13.0 - 17.0 g/dL 8.8(L) 9.7(L) 10.2(L)  Hematocrit 39.0 - 52.0 % 27.8(L) 30.0(L) 30.4(L)  Platelets 150 - 400 K/uL 116(L) 123(L) 121(L)   CMP Latest Ref Rng & Units 04/04/2019 04/03/2019 04/03/2019  Glucose 70 - 99 mg/dL 111(H) 119(H) 146(H)  BUN 8 - 23 mg/dL 20 17 14   Creatinine 0.61 - 1.24 mg/dL 1.32(H) 1.16 1.18  Sodium 135 - 145 mmol/L 134(L) 136 138  Potassium 3.5 - 5.1 mmol/L 4.3 3.8 4.4  Chloride 98 - 111 mmol/L 104 103 108  CO2 22 - 32 mmol/L 25 24 22   Calcium 8.9 - 10.3 mg/dL  8.0(L) 8.1(L) 8.1(L)  Total Protein 6.5 - 8.1 g/dL - - -  Total Bilirubin 0.3 - 1.2 mg/dL - - -  Alkaline Phos 38 - 126 U/L - - -  AST 15 - 41 U/L - - -  ALT 0 - 44 U/L - - -    CXR: clear  _______________________________________________________________  Assessment and Plan: POD 2 s/p CABG 4.  Doing well  Neuro: pain controlled CV: on A/S/BB.   Pulm: on 4L, continue pulm toilet.  Will remove CTs Renal: creat slightly up, will hold lasix today GI: on diet Heme: stable ID: afebrile Endo: on SSI Dispo: floor today  Melodie Bouillon, MD 04/04/2019 10:17 AM

## 2019-04-05 ENCOUNTER — Telehealth: Payer: Self-pay | Admitting: Student

## 2019-04-05 DIAGNOSIS — I480 Paroxysmal atrial fibrillation: Secondary | ICD-10-CM

## 2019-04-05 LAB — CBC
HCT: 28.1 % — ABNORMAL LOW (ref 39.0–52.0)
Hemoglobin: 9.1 g/dL — ABNORMAL LOW (ref 13.0–17.0)
MCH: 31.1 pg (ref 26.0–34.0)
MCHC: 32.4 g/dL (ref 30.0–36.0)
MCV: 95.9 fL (ref 80.0–100.0)
Platelets: 123 10*3/uL — ABNORMAL LOW (ref 150–400)
RBC: 2.93 MIL/uL — ABNORMAL LOW (ref 4.22–5.81)
RDW: 13.2 % (ref 11.5–15.5)
WBC: 7.1 10*3/uL (ref 4.0–10.5)
nRBC: 0 % (ref 0.0–0.2)

## 2019-04-05 LAB — GLUCOSE, CAPILLARY
Glucose-Capillary: 110 mg/dL — ABNORMAL HIGH (ref 70–99)
Glucose-Capillary: 112 mg/dL — ABNORMAL HIGH (ref 70–99)
Glucose-Capillary: 121 mg/dL — ABNORMAL HIGH (ref 70–99)
Glucose-Capillary: 84 mg/dL (ref 70–99)
Glucose-Capillary: 93 mg/dL (ref 70–99)
Glucose-Capillary: 95 mg/dL (ref 70–99)

## 2019-04-05 LAB — BASIC METABOLIC PANEL
Anion gap: 9 (ref 5–15)
BUN: 24 mg/dL — ABNORMAL HIGH (ref 8–23)
CO2: 24 mmol/L (ref 22–32)
Calcium: 8.4 mg/dL — ABNORMAL LOW (ref 8.9–10.3)
Chloride: 101 mmol/L (ref 98–111)
Creatinine, Ser: 1.24 mg/dL (ref 0.61–1.24)
GFR calc Af Amer: >60 mL/min (ref >60)
GFR calc non Af Amer: 56 mL/min — ABNORMAL LOW (ref >60)
Glucose, Bld: 123 mg/dL — ABNORMAL HIGH (ref 70–99)
Potassium: 4.2 mmol/L (ref 3.5–5.1)
Sodium: 134 mmol/L — ABNORMAL LOW (ref 135–145)

## 2019-04-05 MED ORDER — FUROSEMIDE 40 MG PO TABS
40.0000 mg | ORAL_TABLET | Freq: Once | ORAL | Status: AC
  Administered 2019-04-05: 40 mg via ORAL
  Filled 2019-04-05: qty 1

## 2019-04-05 MED ORDER — AMIODARONE HCL IN DEXTROSE 360-4.14 MG/200ML-% IV SOLN
30.0000 mg/h | INTRAVENOUS | Status: DC
  Administered 2019-04-05 – 2019-04-06 (×2): 30 mg/h via INTRAVENOUS
  Filled 2019-04-05 (×2): qty 200

## 2019-04-05 MED ORDER — AMIODARONE HCL IN DEXTROSE 360-4.14 MG/200ML-% IV SOLN
60.0000 mg/h | INTRAVENOUS | Status: AC
  Administered 2019-04-05: 13:00:00 60 mg/h via INTRAVENOUS
  Filled 2019-04-05: qty 200

## 2019-04-05 MED ORDER — INSULIN ASPART 100 UNIT/ML ~~LOC~~ SOLN: 0.0000 [IU] | Freq: Three times a day (TID) | SUBCUTANEOUS | Status: DC

## 2019-04-05 MED FILL — Heparin Sodium (Porcine) Inj 1000 Unit/ML: INTRAMUSCULAR | Qty: 30 | Status: AC

## 2019-04-05 MED FILL — Heparin Sodium (Porcine) Inj 1000 Unit/ML: INTRAMUSCULAR | Qty: 10 | Status: AC

## 2019-04-05 MED FILL — Potassium Chloride Inj 2 mEq/ML: INTRAVENOUS | Qty: 20 | Status: AC

## 2019-04-05 MED FILL — Calcium Chloride Inj 10%: INTRAVENOUS | Qty: 10 | Status: AC

## 2019-04-05 MED FILL — Sodium Chloride IV Soln 0.9%: INTRAVENOUS | Qty: 3000 | Status: AC

## 2019-04-05 MED FILL — Cefuroxime Sodium For Inj 750 MG: INTRAMUSCULAR | Qty: 750 | Status: AC

## 2019-04-05 MED FILL — Lidocaine HCl Local Preservative Free (PF) Inj 2%: INTRAMUSCULAR | Qty: 15 | Status: AC

## 2019-04-05 MED FILL — Electrolyte-R (PH 7.4) Solution: INTRAVENOUS | Qty: 4000 | Status: AC

## 2019-04-05 MED FILL — Sodium Bicarbonate IV Soln 8.4%: INTRAVENOUS | Qty: 50 | Status: AC

## 2019-04-05 MED FILL — Mannitol IV Soln 20%: INTRAVENOUS | Qty: 500 | Status: AC

## 2019-04-05 MED FILL — Potassium Chloride Inj 2 mEq/ML: INTRAVENOUS | Qty: 40 | Status: AC

## 2019-04-05 NOTE — Plan of Care (Signed)
Poc progressing.  

## 2019-04-05 NOTE — Progress Notes (Signed)
I am covering [facility] box today and reviewed request for f/u appt. It appears this patient already has a f/u visit scheduled with Dr. Martinique on 12/7 which we will keep. I will send message to [facility] pool to request that pt get TOC call.  Kyndall Chaplin PA-C

## 2019-04-05 NOTE — Discharge Summary (Addendum)
[address]       [place] [address]             [phone]      Physician Discharge Summary  Patient ID: Jake Dunn MRN: [id number] DOB/AGE: 76-01-1941 76 y.o.  Admit date: 03/29/2019 Discharge date: 04/08/2019  Admission Diagnoses:  Patient Active Problem List   Diagnosis Date Noted   CAD, multiple vessel 03/29/2019   Crescendo angina ([place]) 03/29/2019   Spinal stenosis of lumbar region with radiculopathy 01/02/2018   Antibiotic drug intolerance 03/01/2017   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[place]) 12/22/2016   Medication monitoring encounter 11/15/2016   Abscess of right shoulder 10/18/2016   Unstable angina ([place]) 08/15/2016   Localized primary osteoarthritis of right shoulder region 04/06/2016   Coughing/wheezing 08/17/2015   Angioedema 08/17/2015   Hypertension    CAD (coronary artery disease)    Stroke [facility])    Hyperlipidemia    Dyspnea 08/07/2012   Pre-operative clearance 06/01/2011    Discharge Diagnoses:  1. S/P CABG x 4 2. Expected ABL anemia 3. Post op atrial fibrillation (with CVR) 4. Pre diabetes (HGA1C 5.8)  Discharged Condition: good  HPI:   Jake Dunn with above hx and hx of orthostatic hypotension and syncope with higher doses of imdur and amlodipine. He was placed on lower doses.  He also has cervical disc disease, followed by Dr. Ellene Route.  He was seen for SOB and decreased energy and chest pain.   Pt had cath with significant disease.  Plan was for outppt TCTS appt for CABG eval. And was having angina so sent to ER for admit.   Today he was seen by Dr. Kipp Brood and pt noted his pain had increased enough he almost called EMS last night.  He had NTG in Dr. Abran Duke office.    He had been on plavix but stopped with plan for CABG.    Now in ER pain better.  EKG without acute MI.  Troponin 4 HGB 14.5 Plts 265 and WBC 8.0   VS stable     Hospital Course:  As dictated by Nicholes Rough: On 04/02/2019 Mr.  Jake Dunn underwent a coronary bypass grafting x4 with Dr. Kipp Brood and Dr. Servando Snare assisting.  He tolerated the procedure well was transferred to the surgical ICU for continued care.  He was extubated in a timely manner.  Postop day 1 he remained stable.  His chest tube output remained low.  He was hemodynamically stable and in normal sinus rhythm with good urine output.  Cardiology was consulted and saw the patient postop day 2.  We continued his aspirin, statin, and beta-blocker.  Postop day 2 he remained stable on 4 L nasal cannula.  We removed his chest tubes.  We held his Lasix due to a slight increase in his creatinine.  He remained afebrile.  He was stable to transfer to the floor.  On the stepdown unit, he continues to progress.  He requested more ambulation in the halls therefore physical therapy was consulted.  He remained on 2 L nasal cannula which was being weaned as tolerated.  He was in rate controlled atrial fibrillation with rates in the 90s to 100s.  He was on metoprolol 25 mg twice daily.  He did have some expected acute blood loss anemia which was stable.  His glucose levels were well controlled and he is not a diabetic therefore his CBGs were discontinued.  Addendum: He has been tolerating a diet and has had  a bowel movement. Of note, patient's HGA1C was 5.8. He likley has pre diabetes. He will require further surveillance with his medical doctor after discharge. Nutrition recommendations have been included with his discharge paperwork. He did have mild thrombocytopenia. His last platelet count was up to 123,000. Wounds are clean and dry and continuing to heal. He is now ambulating on room air. He was in sinus rhythm the morning of 11/30. Amiodarone will be dosed down and he will be continued on Lopressor 25 mg bid, Apixaban 5 mg bid (CHADSVASC score 5), and full dose ec asa (per cardiology). Epicardial pacing wires were previously removed. Chest tube sutures will be removed in the office at  appointment on 12/04. As discussed with Dr. Kipp Brood, the patient is felt surgically stable for discharge today.  Consults: cardiology  Significant Diagnostic Studies:  Cardiac Cath:    Prox RCA lesion is 45% stenosed.  2nd RPL lesion is 80% stenosed.  Ost LM to Mid LM lesion is 50% stenosed.  Ost Cx to Mid Cx lesion is 80% stenosed.  Ramus lesion is 90% stenosed.  Mid LAD lesion is 70% stenosed.  2nd Diag lesion is 60% stenosed.  The left ventricular systolic function is normal.  LV end diastolic pressure is normal.  The left ventricular ejection fraction is 55-65% by visual estimate.   1. Three vessel obstructive, Calcific CAD 2. Normal LV function 3. Normal LVEDP  Plan: recommend referral to CT surgery for CABG. Will stop Plavix.   Diagnostic Dominance: Right Left Main  Ost LM to Mid LM lesion 50% stenosed  Ost LM to Mid LM lesion is 50% stenosed. The lesion is severely calcified.  Left Anterior Descending  Mid LAD lesion 70% stenosed  Mid LAD lesion is 70% stenosed. The lesion is severely calcified.  Second Diagonal Branch  2nd Diag lesion 60% stenosed  2nd Diag lesion is 60% stenosed.  Ramus Intermedius  Vessel is large.  Ramus lesion 90% stenosed  Ramus lesion is 90% stenosed. The lesion is eccentric. The lesion is severely calcified.  Left Circumflex  Ost Cx to Mid Cx lesion 80% stenosed  Ost Cx to Mid Cx lesion is 80% stenosed.  Right Coronary Artery  Prox RCA lesion 45% stenosed  Prox RCA lesion is 45% stenosed. The lesion is severely calcified.  Second Right Posterolateral Branch  2nd RPL lesion 80% stenosed  2nd RPL lesion is 80% stenosed.  Intervention  No interventions have been documented. Wall Motion  Resting               Left Heart  Left Ventricle The left ventricular size is normal. The left ventricular systolic function is normal. LV end diastolic pressure is normal. The left ventricular ejection fraction is 55-65% by  visual estimate. No regional wall motion abnormalities.  Coronary Diagrams  Diagnostic Dominance: Right  Intervention    Treatments:   04/02/2019 Patient:  Jake Dunn Pre-Op Dx: 3V CAD                         Unstable angina   Post-op Dx:  same Procedure: CABG X 4.  LIMA LAD, RSVG PLV, Ramus, and D2  (T graft off the ramus vein graft) Endoscopic greater saphenous vein harvest on the right Intra-operative Transesophageal Echocardiogram  Surgeon and Role:      * Lightfoot, Lucile Crater, MD - Primary    * Dr. Servando Snare, MD - assisting Assistant: T. Harriet Pho, PA-C  Anesthesia  general  EBL:  500 ml Blood Administration: none Xclamp Time:  59 min Pump Time:  162min  Drains: 19 F blake drain:  R, L, mediastinal  Wires: none Counts: correct   Indications: 78 yo male admitted from clinic with unstable angina.  He had previously had a LHC which showed 3V CAD.  He was brought to the OR for surgical revascularization.  Findings: Good conduit.  Calcified, intramyocardia LAD.  Intramyocardial ramus.  Good flows on all vein grafts  Operative Technique: All invasive lines were placed in pre-op holding.  After the risks, benefits and alternatives were thoroughly discussed, the patient was brought to the operative theatre.  Anesthesia was induced, and the patient was prepped and draped in normal sterile fashion.  An appropriate surgical pause was performed, and pre-operative antibiotics were dosed accordingly.  We began with simultaneous incisions were made along the right leg for harvesting of the greater saphenous vein and the chest for the sternotomy.  In regards to the sternotomy, this was carried down with bovie cautery, and the sternum was divided with a reciprocating saw.  Meticulous hemostasis was obtained.  The left internal thoracic artery was exposed and harvested in in pedicled fashion.  The patient was systemically heparinized, and the artery was divided distally, and  placed in a papaverine sponge.    The sternal elevator was removed, and a retractor was placed.  The pericardium was divided in the midline and fashioned into a cradle with pericardial stitches.   After we confirmed an appropriate ACT, the ascending aorta was cannulated in standard fashion.  The right atrial appendage was used for venous cannulation site.  Cardiopulmonary bypass was initiated, and the heart retractor was placed. The cross clamp was applied, and a dose of anterograde cardioplegia was given with good arrest of the heart.  We moved to the posterior wall of the heart, and found a good target on the PL.  An arteriotomy was made, and the vein graft was anastomosed to it in an end to side fashion.  Next we exposed the lateral wall, and found a good target on the ramus branch.  An end to side anastomosis with the vein graft was then created.  Next, we exposed the anterior wall of the heart and identified a good target on 2nd diagonal.   An arteriotomy was created.  The vein was anastomosed in an end to side fashion.  Finally, we exposed a good target on the LAD, and fashioned an end to side anastomosis between it and the LITA.  We began to re-warm, and a re-animation dose of cardioplegia was given.  The heart was de-aired, and the cross clamp was removed.  Meticulous hemostasis was obtained.    A partial occludding clamp was then placed on the ascending aorta, and we created an end to side anastomosis between it and the proximal vein grafts.  The vein graft of the diagonal branch was jumped off the hood of the ramus branch.  The proximal sites were marked with rings.  Hemostasis was obtained, and we separated from cardiopulmonary bypass without event.the heparin was reversed with protamine.  Chest tubes and wires were placed, and the sternum was re-approximated with with sternal wires.  The soft tissue and skin were re-approximated wth absorbable suture.    The patient tolerated the procedure  without any immediate complications, and was transferred to the ICU in guarded condition.  Lajuana Matte  Discharge Exam: Blood pressure 122/66, pulse 78, temperature 98.6 F (37 C), temperature  source Oral, resp. rate 18, height 5\' 7"  (1.702 m), weight 106.4 kg, SpO2 98 %.   Cardiovascular: RRR Pulmonary: Slightly diminished bibasilar breath sounds Abdomen: Soft, non tender, obese,bowel sounds present. Extremities: Mild bilateral lower extremity edema. Wounds: Clean and dry.  No erythema or signs of infection. ++ ecchymosis right thigh   Disposition: Discharge disposition: 01-Home or Self Care        Allergies as of 04/08/2019      Reactions   Adhesive [tape] Rash   Paper tape okay   Latex Rash      Medication List    STOP taking these medications   aspirin 81 MG EC tablet Replaced by: aspirin 325 MG tablet   HYDROcodone-acetaminophen 5-325 MG tablet Commonly known as: NORCO/VICODIN   isosorbide mononitrate 60 MG 24 hr tablet Commonly known as: IMDUR   losartan 25 MG tablet Commonly known as: COZAAR   nitroGLYCERIN 0.4 MG SL tablet Commonly known as: NITROSTAT     TAKE these medications   acyclovir 400 MG tablet Commonly known as: ZOVIRAX Take 400 mg by mouth 3 (three) times daily as needed (For cold sores). Marland Kitchen   allopurinol 300 MG tablet Commonly known as: ZYLOPRIM Take 300 mg by mouth daily as needed (for gout flares).   amiodarone 400 MG tablet Commonly known as: PACERONE Take 0.5 tablets (200 mg total) by mouth 2 (two) times daily. For one week;then take 200 mg daily thereafter   apixaban 5 MG Tabs tablet Commonly known as: ELIQUIS Take 1 tablet (5 mg total) by mouth 2 (two) times daily.   aspirin 325 MG tablet Take 1 tablet (325 mg total) by mouth daily. Replaces: aspirin 81 MG EC tablet   atorvastatin 80 MG tablet Commonly known as: LIPITOR Take 1 tablet (80 mg total) by mouth every evening.   EPINEPHrine 0.3 mg/0.3 mL Soaj  injection Commonly known as: EPI-PEN Inject 0.3 mg into the muscle as needed for anaphylaxis.   furosemide 40 MG tablet Commonly known as: LASIX Take 1 tablet (40 mg total) by mouth daily. For one week;then take 20 mg daily thereafter What changed:   medication strength  how much to take  additional instructions   LORazepam 0.5 MG tablet Commonly known as: ATIVAN Take 0.5 mg by mouth 2 (two) times daily as needed for anxiety.   metoprolol tartrate 25 MG tablet Commonly known as: LOPRESSOR Take 25 mg by mouth 2 (two) times daily.   oxyCODONE 5 MG immediate release tablet Commonly known as: Oxy IR/ROXICODONE Take 1 tablet (5 mg total) by mouth every 6 (six) hours as needed for severe pain.   pantoprazole 40 MG tablet Commonly known as: PROTONIX Take 40 mg by mouth every evening.   potassium chloride SA 20 MEQ tablet Commonly known as: KLOR-CON Take 1 tablet (20 mEq total) by mouth daily. For one week then stop       The patient has been discharged on:   1.Beta Blocker:  Yes [  x ]                              No   [   ]                              If No, reason:  2.Ace Inhibitor/ARB: Yes [   ]  No  [  x  ]                                     If No, reason:Labile BP  3.Statin:   Yes [ x  ]                  No  [   ]                  If No, reason:  4.Ecasa:  Yes  [ x  ]                  No   [   ]                  If No, reason:  Follow-up Information    Harlan Stains, MD. Call.   Specialty: Family Medicine Why: for a follow up appointment regarding further surveillance of HGA1C 5.8 (pre diabetes) Contact information: [place] [address] [phone]        Martinique, Peter M, MD Follow up.   Specialty: Cardiology Why: [facility] - follow-up as previously scheduled below on 04/15/19 at 4:40pm. Please arrive 15 minutes prior to appointment to check in. Contact information: [address] [place] [address] [phone]        Lajuana Matte, MD. Go on 04/12/2019.   Specialty: Cardiothoracic Surgery Why: Office will call with appointment time Contact information: [place] [address] [phone]           Signed: Arnoldo Lenis 04/08/2019, 10:17 AM

## 2019-04-05 NOTE — Evaluation (Signed)
Physical Therapy Evaluation Patient Details Name: Jake Dunn MRN: [id number] DOB: 1941-04-17 Today's Date: 04/05/2019   History of Present Illness  Pt is a 78 y/o male s/p CABG X 4, LIMA LAD, RSVG PLV, Ramus, and D2  (T graft off the ramus vein graft) secondary to unstable angina. PMH including but not limited to CAD, HTN and stroke.    Clinical Impression  Pt presented sitting upright at EOB, awake and willing to participate in therapy session. Prior to admission, pt reported that he was independent with all functional mobility and ADLs. Enjoys being outdoors and fishing. At the time of evaluation, pt performing transfers with min guard and ambulated in hallway with use of RW and min guard for safety. Pt on 2L of O2 throughout, unable to get a good reading for his SPO2; however, pt asymptomatic and HR stable. PT will continue to follow acutely to progress mobility as tolerated per PT POC.    Follow Up Recommendations No PT follow up;Supervision for mobility/OOB    Equipment Recommendations  Rolling walker with 5" wheels    Recommendations for Other Services       Precautions / Restrictions Precautions Precautions: Fall;Sternal Precaution Comments: reviewed no pushing, pulling or lifting with UEs Restrictions Weight Bearing Restrictions: Yes Other Position/Activity Restrictions: sternal precautions      Mobility  Bed Mobility               General bed mobility comments: pt sitting upright at EOB upon arrival  Transfers Overall transfer level: Needs assistance Equipment used: None Transfers: Sit to/from Stand Sit to Stand: Min guard;From elevated surface         General transfer comment: pt hugging his cardiac pillow during transitional movement with min guard for safety  Ambulation/Gait Ambulation/Gait assistance: Min guard Gait Distance (Feet): 250 Feet Assistive device: Rolling walker (2 wheeled) Gait Pattern/deviations: Step-through pattern;Decreased  stride length Gait velocity: decreased   General Gait Details: pt requiring brief standing rest breaks x3 throughout secondary to fatigue; pt overall steady with RW, no LOB or need for physical assistance, min guard for safety; pt on 2L of O2 throughout  Stairs            Wheelchair Mobility    Modified Rankin (Stroke Patients Only)       Balance Overall balance assessment: Needs assistance Sitting-balance support: Feet supported Sitting balance-Leahy Scale: Good     Standing balance support: During functional activity;Bilateral upper extremity supported Standing balance-Leahy Scale: Poor                               Pertinent Vitals/Pain Pain Assessment: 0-10 Pain Score: 5  Pain Location: incision site on chest Pain Descriptors / Indicators: Sore Pain Intervention(s): Monitored during session;Repositioned    Home Living Family/patient expects to be discharged to:: Private residence Living Arrangements: Spouse/significant other Available Help at Discharge: Family;Available 24 hours/day Type of Home: House Home Access: Stairs to enter Entrance Stairs-Rails: Psychiatric nurse of Steps: 3 Home Layout: One level Home Equipment: Walker - standard;Bedside commode      Prior Function Level of Independence: Independent               Hand Dominance        Extremity/Trunk Assessment   Upper Extremity Assessment Upper Extremity Assessment: Overall WFL for tasks assessed    Lower Extremity Assessment Lower Extremity Assessment: Overall WFL for tasks assessed  Communication   Communication: HOH  Cognition Arousal/Alertness: Awake/alert Behavior During Therapy: WFL for tasks assessed/performed Overall Cognitive Status: Within Functional Limits for tasks assessed                                        General Comments      Exercises     Assessment/Plan    PT Assessment Patient needs continued  PT services  PT Problem List Decreased strength;Decreased activity tolerance;Decreased balance;Decreased mobility;Decreased coordination;Decreased knowledge of use of DME;Decreased safety awareness;Cardiopulmonary status limiting activity;Decreased knowledge of precautions;Pain       PT Treatment Interventions DME instruction;Gait training;Stair training;Functional mobility training;Therapeutic activities;Therapeutic exercise;Balance training;Neuromuscular re-education;Patient/family education    PT Goals (Current goals can be found in the Care Plan section)  Acute Rehab PT Goals Patient Stated Goal: decrease pain, be as active as possible PT Goal Formulation: With patient Time For Goal Achievement: 04/19/19 Potential to Achieve Goals: Good    Frequency Min 3X/week   Barriers to discharge        Co-evaluation               AM-PAC PT "6 Clicks" Mobility  Outcome Measure Help needed turning from your back to your side while in a flat bed without using bedrails?: A Little Help needed moving from lying on your back to sitting on the side of a flat bed without using bedrails?: A Little Help needed moving to and from a bed to a chair (including a wheelchair)?: None Help needed standing up from a chair using your arms (e.g., wheelchair or bedside chair)?: A Little Help needed to walk in hospital room?: None Help needed climbing 3-5 steps with a railing? : A Little 6 Click Score: 20    End of Session Equipment Utilized During Treatment: Oxygen Activity Tolerance: Patient tolerated treatment well Patient left: in bed;with call bell/phone within reach;Other (comment)(sitting EOB) Nurse Communication: Mobility status;Precautions PT Visit Diagnosis: Other abnormalities of gait and mobility (R26.89)    Time: [id number] PT Time Calculation (min) (ACUTE ONLY): 34 min   Charges:   PT Evaluation $PT Eval Moderate Complexity: 1 Mod PT Treatments $Gait Training: 8-22 mins         Anastasio Champion, DPT  Acute Rehabilitation Services Pager [phone] Office [place] 04/05/2019, 12:44 PM

## 2019-04-05 NOTE — Progress Notes (Addendum)
      [address]       [place] [address]             [phone]      3 Days Post-Op Procedure(s) (LRB): CORONARY ARTERY BYPASS GRAFTING (CABG) using LIMA to LAD; Endoscopically harvested right greater saphenous vein to the PLB, Ramus, and Diag 2. (N/A) Subjective: Feels good this morning. Has not walked in the hall yet.   Objective: Vital signs in last 24 hours: Temp:  [97.8 F (36.6 C)-99 F (37.2 C)] 97.8 F (36.6 C) (11/27 0736) Pulse Rate:  [66-113] 113 (11/27 0736) Cardiac Rhythm: Atrial fibrillation (11/26 2056) Resp:  [14-23] 17 (11/27 0736) BP: (83-126)/(49-88) 108/88 (11/27 0736) SpO2:  [87 %-100 %] 97 % (11/27 0736)     Intake/Output from previous day: 11/26 0701 - 11/27 0700 In: 200 [P.O.:200] Out: 730 [Urine:700; Chest Tube:30] Intake/Output this shift: Total I/O In: 120 [P.O.:120] Out: -   General appearance: alert, cooperative and no distress Heart: irregularly irregular rhythm Lungs: clear to auscultation bilaterally Abdomen: soft, non-tender; bowel sounds normal; no masses,  no organomegaly Extremities: 1+ nonpitting pedal edema bilaterally Wound: clean and dry  Lab Results: Recent Labs    04/04/19 0441 04/05/19 0228  WBC 9.2 7.1  HGB 8.8* 9.1*  HCT 27.8* 28.1*  PLT 116* 123*   BMET:  Recent Labs    04/04/19 0441 04/05/19 0228  NA 134* 134*  K 4.3 4.2  CL 104 101  CO2 25 24  GLUCOSE 111* 123*  BUN 20 24*  CREATININE 1.32* 1.24  CALCIUM 8.0* 8.4*    PT/INR:  Recent Labs    04/02/19 1403  LABPROT 16.3*  INR 1.3*   ABG    Component Value Date/Time   PHART 7.286 (L) 04/02/2019 1808   HCO3 16.9 (L) 04/02/2019 1808   TCO2 18 (L) 04/02/2019 1808   ACIDBASEDEF 9.0 (H) 04/02/2019 1808   O2SAT 92.0 04/02/2019 1808   CBG (last 3)  Recent Labs    04/05/19 0010 04/05/19 0419 04/05/19 0735  GLUCAP 112* 95 121*    Assessment/Plan: S/P Procedure(s) (LRB): CORONARY ARTERY BYPASS GRAFTING (CABG) using  LIMA to LAD; Endoscopically harvested right greater saphenous vein to the PLB, Ramus, and Diag 2. (N/A)  1. CV-rate-controlled Afib in the 100s. BP marginal. On metoprolol 25mg  BID. Continue ASA, and statin. 2. Pulm- Continue [place] at 2L. Wean as able. Continue to encourage incentive spirometer.  3. Renal-creatinine 1.24, electrolytes okay 4. H and H 9.1/28.1, expected acute blood loss anemia 5. Endo-blood glucose well controlled and not a diabetic-can discontinue CBGs  Plan: Will discuss with Dr. Kipp Brood adding oral Amio if metoprolol does not convert patient this morning. Would hold on titrating metoprolol due to BP. Weaning oxygen as able. Ambulate in the halls. Hopefully home at some point this weekend.    LOS: 7 days    Jake Dunn 04/05/2019  Agree with above Rate controlled Giving some diuresis today Will start amio infusion, and convert to [place]

## 2019-04-05 NOTE — Telephone Encounter (Signed)
   Per CVTS team, Jake Dunn underwent a CABG x 4 on 11/24 with Dr. Kipp Brood. His plan is for discharge this weekend. He has a pre-existing f/u on 12/7 which we will use as his TOC visit. Can you ensure he gets the [facility] call post DC? Thanks, [employer]

## 2019-04-05 NOTE — Progress Notes (Signed)
Progress Note  Patient Name: Jake Dunn Date of Encounter: 04/05/2019  Primary Cardiologist: Peter Martinique, MD   Subjective   No chest pain. Atrial fib noted overnight.   Inpatient Medications    Scheduled Meds: . acetaminophen  1,000 mg Oral Q6H   Or  . acetaminophen (TYLENOL) oral liquid 160 mg/5 mL  1,000 mg Per Tube Q6H  . aspirin  325 mg Oral Daily  . atorvastatin  80 mg Oral QPM  . bisacodyl  10 mg Oral Daily   Or  . bisacodyl  10 mg Rectal Daily  . Chlorhexidine Gluconate Cloth  6 each Topical Daily  . docusate sodium  200 mg Oral Daily  . insulin aspart  0-24 Units Subcutaneous Q4H  . mouth rinse  15 mL Mouth Rinse BID  . metoprolol tartrate  25 mg Oral BID   Or  . metoprolol tartrate  12.5 mg Per Tube BID  . pantoprazole  40 mg Oral Daily  . sodium chloride flush  10-40 mL Intracatheter Q12H  . sodium chloride flush  3 mL Intravenous Q12H  . sodium chloride flush  3 mL Intravenous Q12H   Continuous Infusions: . sodium chloride Stopped (04/03/19 0954)  . sodium chloride    . sodium chloride 20 mL/hr at 04/02/19 1353  . sodium chloride    . lactated ringers Stopped (04/03/19 0514)  . lactated ringers 20 mL/hr at 04/04/19 0600   PRN Meds: sodium chloride, sodium chloride, ALPRAZolam, metoprolol tartrate, morphine injection, ondansetron (ZOFRAN) IV, oxyCODONE, sodium chloride flush, sodium chloride flush, sodium chloride flush, traMADol   Vital Signs    Vitals:   04/04/19 2134 04/05/19 0009 04/05/19 0414 04/05/19 0736  BP: 101/67 103/66 99/65 108/88  Pulse: 66 75 92 (!) 113  Resp: 16 16 15 17   Temp:  99 F (37.2 C) 98.8 F (37.1 C) 97.8 F (36.6 C)  TempSrc:  Oral Oral Oral  SpO2: 98% 95% 96% 97%  Weight:      Height:        Intake/Output Summary (Last 24 hours) at 04/05/2019 [id number] Last data filed at 04/05/2019 [id number] Gross per 24 hour  Intake 320 ml  Output 700 ml  Net -380 ml   Filed Weights   04/02/19 0500 04/03/19 0600 04/04/19 0500   Weight: 102.6 kg 103.9 kg 108.3 kg    Telemetry    Sinus - Personally Reviewed  ECG    No AM EKG - Personally Reviewed  Physical Exam    General: Well developed, well nourished, NAD  HEENT: OP clear, mucus membranes moist  SKIN: warm, dry. No rashes. Neuro: No focal deficits  Musculoskeletal: Muscle strength 5/5 all ext  Psychiatric: Mood and affect normal  Neck: No JVD, no carotid bruits, no thyromegaly, no lymphadenopathy.  Lungs:Clear bilaterally, no wheezes, rhonci, crackles Cardiovascular: Irreg irreg. No murmurs, gallops or rubs. Abdomen:Soft. Bowel sounds present. Non-tender.  Extremities: No lower extremity edema.     Labs    Chemistry Recent Labs  Lab 03/29/19 2109  04/01/19 0455  04/03/19 1522 04/04/19 0441 04/05/19 0228  NA  --    < > 138   < > 136 134* 134*  K  --    < > 3.7   < > 3.8 4.3 4.2  CL  --    < > 105   < > 103 104 101  CO2  --    < > 24   < > 24 25 24   GLUCOSE  --    < >  101*   < > 119* 111* 123*  BUN  --    < > 18   < > 17 20 24*  CREATININE  --    < > 1.31*   < > 1.16 1.32* 1.24  CALCIUM  --    < > 9.1   < > 8.1* 8.0* 8.4*  PROT 7.1  --  6.7  --   --   --   --   ALBUMIN 3.9  --  3.7  --   --   --   --   AST 14*  --  12*  --   --   --   --   ALT 15  --  15  --   --   --   --   ALKPHOS 83  --  79  --   --   --   --   BILITOT 0.7  --  0.5  --   --   --   --   GFRNONAA  --    < > 53*   < > >60 52* 56*  GFRAA  --    < > >60   < > >60 >60 >60  ANIONGAP  --    < > 9   < > 9 5 9    < > = values in this interval not displayed.     Hematology Recent Labs  Lab 04/03/19 1522 04/04/19 0441 04/05/19 0228  WBC 10.2 9.2 7.1  RBC 3.11* 2.81* 2.93*  HGB 9.7* 8.8* 9.1*  HCT 30.0* 27.8* 28.1*  MCV 96.5 98.9 95.9  MCH 31.2 31.3 31.1  MCHC 32.3 31.7 32.4  RDW 13.2 13.4 13.2  PLT 123* 116* 123*    Cardiac EnzymesNo results for input(s): TROPONINI in the last 168 hours. No results for input(s): TROPIPOC in the last 168 hours.   BNP Recent  Labs  Lab 03/29/19 2109  BNP 50.2     DDimer No results for input(s): DDIMER in the last 168 hours.   Radiology    Dg Chest Port 1 View  Result Date: 04/04/2019 CLINICAL DATA:  Pleural effusion EXAM: PORTABLE CHEST 1 VIEW COMPARISON:  04/03/2019 FINDINGS: Swan-Ganz catheter removed. Right jugular sheath remains in place. Left chest tube remains in place. No pneumothorax. Mild bibasilar atelectasis with improvement. No significant effusion. Cardiac silhouette is enlarged. IMPRESSION: Improved atelectasis in the bases.  No pneumothorax. Electronically Signed   By: Franchot Gallo M.D.   On: 04/04/2019 08:37    Cardiac Studies   03/30/2019 Echocardiogram:   IMPRESSIONS    1. Left ventricular ejection fraction, by visual estimation, is 55 to 60%. The left ventricle has normal function. There is no left ventricular hypertrophy.  2. Global right ventricle has mildly reduced systolic function.The right ventricular size is mildly enlarged. No increase in right ventricular wall thickness.  3. Left atrial size was normal.  4. Right atrial size was mildly dilated.  5. The mitral valve is normal in structure. No evidence of mitral valve regurgitation.  6. The tricuspid valve is normal in structure. Tricuspid valve regurgitation is trivial.  7. The aortic valve is normal in structure. Aortic valve regurgitation is not visualized. No evidence of aortic valve sclerosis or stenosis.  8. The pulmonic valve was grossly normal. Pulmonic valve regurgitation is not visualized.  9. Aortic dilatation noted. 10. There is mild dilatation of the ascending aorta measuring 38 mm. 11. Normal pulmonary artery systolic pressure. 12. The atrial septum is grossly  normal.  03/26/2019 Cardiac catheterization:  Prox RCA lesion is 45% stenosed.  2nd RPL lesion is 80% stenosed.  Ost LM to Mid LM lesion is 50% stenosed.  Ost Cx to Mid Cx lesion is 80% stenosed.  Ramus lesion is 90% stenosed.  Mid LAD  lesion is 70% stenosed.  2nd Diag lesion is 60% stenosed.  The left ventricular systolic function is normal.  LV end diastolic pressure is normal.  The left ventricular ejection fraction is 55-65% by visual estimate.   1. Three vessel obstructive, Calcific CAD 2. Normal LV function 3. Normal LVEDP  Plan: recommend referral to CT surgery for CABG. Will stop Plavix.  Patient Profile     78 y.o. male with a history of CAD, GERD, HTN, HLD, renal cell CA s/p left nephrectomy and recent cardiac cath for chest pain on 11/17 that showed 3 vessel disease presented for CABG.  Assessment & Plan    1. CAD/Unstable angina: No complaints this morning. Continue ASA, statin and beta blocker.  2. Post-op atrial fibrillation:   Pt in and out of sinus/atrial fibrillation overnight. Rate controlled atrial fib now. Would consider amiodarone if he stays in atrial fib today. If he has continued atrial fib before discharge, will need to consider coumadin.   We will follow up tomorrow.   For questions or updates, please contact [place] Please consult [email or web address] for contact info under Cardiology/STEMI.      Signed, Lauree Chandler, MD  04/05/2019, 9:22 AM

## 2019-04-05 NOTE — Progress Notes (Signed)
Pt was in and out of NSR and Afib. After getting PM PO metoprolol , pt was on SR but converted back to Afib. 12 lead EKG done, showed afib rate in 80's. Vitals stable. NO s/s of distress. Will continue to monitor and will pass on to day shift.

## 2019-04-05 NOTE — Care Management Important Message (Signed)
Important Message  Patient Details  Name: Jake Dunn MRN: [id number] Date of Birth: 08-27-1940   Medicare Important Message Given:  Yes     [name] 04/05/2019, 11:11 AM

## 2019-04-05 NOTE — Progress Notes (Signed)
Pt ambulated approximately 900 in the hallway with rolling walker and O2 at 2L, pt tolerated well.

## 2019-04-06 DIAGNOSIS — E7800 Pure hypercholesterolemia, unspecified: Secondary | ICD-10-CM

## 2019-04-06 DIAGNOSIS — I4891 Unspecified atrial fibrillation: Secondary | ICD-10-CM

## 2019-04-06 DIAGNOSIS — I9789 Other postprocedural complications and disorders of the circulatory system, not elsewhere classified: Secondary | ICD-10-CM

## 2019-04-06 DIAGNOSIS — I10 Essential (primary) hypertension: Secondary | ICD-10-CM

## 2019-04-06 LAB — GLUCOSE, CAPILLARY
Glucose-Capillary: 114 mg/dL — ABNORMAL HIGH (ref 70–99)
Glucose-Capillary: 135 mg/dL — ABNORMAL HIGH (ref 70–99)

## 2019-04-06 MED ORDER — FUROSEMIDE 40 MG PO TABS
40.0000 mg | ORAL_TABLET | Freq: Once | ORAL | Status: AC
  Administered 2019-04-06: 40 mg via ORAL
  Filled 2019-04-06: qty 1

## 2019-04-06 MED ORDER — APIXABAN 5 MG PO TABS
5.0000 mg | ORAL_TABLET | Freq: Two times a day (BID) | ORAL | Status: DC
  Administered 2019-04-06 – 2019-04-08 (×5): 5 mg via ORAL
  Filled 2019-04-06 (×5): qty 1

## 2019-04-06 MED ORDER — AMIODARONE HCL 200 MG PO TABS
400.0000 mg | ORAL_TABLET | Freq: Two times a day (BID) | ORAL | Status: DC
  Administered 2019-04-06 – 2019-04-08 (×5): 400 mg via ORAL
  Filled 2019-04-06 (×6): qty 2

## 2019-04-06 NOTE — Progress Notes (Signed)
CARDIAC REHAB PHASE I   PRE:  Rate/Rhythm: 79 Afib  BP:  Sitting: 110/62        SaO2: 98% RA  MODE:  Ambulation: 210 ft   POST:  Rate/Rhythm: 83 Afib  BP:  Sitting: 123/70      SaO2: 100% 2L  Pt ambulated 210 ft with 2 assist, rolling walker, and gait bait on 2L O2. Pt stated he was SOB once standing out of bed. Pulse oximeter difficult to read. O2 needs based on Pt symptoms. Pt took several short standing rest breaks complaining of SOB. Pt also stated feeling weaker today from lack of rest. Pt helped back to bed. Stated breathing eased with seated in bed. Encouraged IS use and walk at least once more today. Call bell and phone within reach.   [place], ACSM CEP 04/06/2019  2:05 PM

## 2019-04-06 NOTE — Plan of Care (Signed)
  Problem: Health Behavior/Discharge Planning: Goal: Ability to manage health-related needs will improve Outcome: Progressing   Problem: Clinical Measurements: Goal: Respiratory complications will improve Outcome: Progressing Goal: Cardiovascular complication will be avoided Outcome: Progressing

## 2019-04-06 NOTE — Discharge Instructions (Addendum)
Discharge Instructions:  1. You may shower, please wash incisions daily with soap and water and keep dry.  If you wish to cover wounds with dressing you may do so but please keep clean and change daily.  No tub baths or swimming until incisions have completely healed.  If your incisions become red or develop any drainage please call our office at [phone]  2. No Driving until cleared by Dr. Abran Duke office and you are no longer using narcotic pain medications  3. Monitor your weight daily.. Please use the same scale and weigh at same time... If you gain 5-10 lbs in 48 hours with associated lower extremity swelling, please contact our office at [phone]  4. Fever of 101.5 for at least 24 hours with no source, please contact our office at [phone]  5. Activity- up as tolerated, please walk at least 3 times per day.  Avoid strenuous activity, no lifting, pushing, or pulling with your arms over 8-10 lbs for a minimum of 6 weeks  6. If any questions or concerns arise, please do not hesitate to contact our office at [phone]  Information on my medicine - ELIQUIS (apixaban)  Why was Eliquis prescribed for you? Eliquis was prescribed for you to reduce the risk of a blood clot forming that can cause a stroke if you have a medical condition called atrial fibrillation (a type of irregular heartbeat).  What do You need to know about Eliquis ? Take your Eliquis TWICE DAILY - one tablet in the morning and one tablet in the evening with or without food. If you have difficulty swallowing the tablet whole please discuss with your pharmacist how to take the medication safely.  Take Eliquis exactly as prescribed by your doctor and DO NOT stop taking Eliquis without talking to the doctor who prescribed the medication.  Stopping may increase your risk of developing a stroke.  Refill your prescription before you run out.  After discharge, you should have regular check-up appointments with  your healthcare provider that is prescribing your Eliquis.  In the future your dose may need to be changed if your kidney function or weight changes by a significant amount or as you get older.  What do you do if you miss a dose? If you miss a dose, take it as soon as you remember on the same day and resume taking twice daily.  Do not take more than one dose of ELIQUIS at the same time to make up a missed dose.  Important Safety Information A possible side effect of Eliquis is bleeding. You should call your healthcare provider right away if you experience any of the following: ? Bleeding from an injury or your nose that does not stop. ? Unusual colored urine (red or dark brown) or unusual colored stools (red or black). ? Unusual bruising for unknown reasons. ? A serious fall or if you hit your head (even if there is no bleeding).  Some medicines may interact with Eliquis and might increase your risk of bleeding or clotting while on Eliquis. To help avoid this, consult your healthcare provider or pharmacist prior to using any new prescription or non-prescription medications, including herbals, vitamins, non-steroidal anti-inflammatory drugs (NSAIDs) and supplements.  This website has more information on Eliquis (apixaban): http://www.eliquis.com/eliquis/home   Prediabetes Eating Plan Prediabetes is a condition that causes blood sugar (glucose) levels to be higher than normal. This increases the risk for developing diabetes. In order to prevent diabetes from developing, your health care provider may  recommend a diet and other lifestyle changes to help you:  Control your blood glucose levels.  Improve your cholesterol levels.  Manage your blood pressure. Your health care provider may recommend working with a diet and nutrition specialist (dietitian) to make a meal plan that is best for you. What are tips for following this plan? Lifestyle  Set weight loss goals with the help of your  health care team. It is recommended that most people with prediabetes lose 7% of their current body weight.  Exercise for at least 30 minutes at least 5 days a week.  Attend a support group or seek ongoing support from a mental health counselor.  Take over-the-counter and prescription medicines only as told by your health care provider. Reading food labels  Read food labels to check the amount of fat, salt (sodium), and sugar in prepackaged foods. Avoid foods that have: ? Saturated fats. ? Trans fats. ? Added sugars.  Avoid foods that have more than 300 milligrams (mg) of sodium per serving. Limit your daily sodium intake to less than 2,300 mg each day. Shopping  Avoid buying pre-made and processed foods. Cooking  Cook with olive oil. Do not use butter, lard, or ghee.  Bake, broil, grill, or boil foods. Avoid frying. Meal planning   Work with your dietitian to develop an eating plan that is right for you. This may include: ? Tracking how many calories you take in. Use a food diary, notebook, or mobile application to track what you eat at each meal. ? Using the glycemic index (GI) to plan your meals. The index tells you how quickly a food will raise your blood glucose. Choose low-GI foods. These foods take a longer time to raise blood glucose.  Consider following a Mediterranean diet. This diet includes: ? Several servings each day of fresh fruits and vegetables. ? Eating fish at least twice a week. ? Several servings each day of whole grains, beans, nuts, and seeds. ? Using olive oil instead of other fats. ? Moderate alcohol consumption. ? Eating small amounts of red meat and whole-fat dairy.  If you have high blood pressure, you may need to limit your sodium intake or follow a diet such as the DASH eating plan. DASH is an eating plan that aims to lower high blood pressure. What foods are recommended? The items listed below may not be a complete list. Talk with your dietitian  about what dietary choices are best for you. Grains Whole grains, such as whole-wheat or whole-grain breads, crackers, cereals, and pasta. Unsweetened oatmeal. Bulgur. Barley. Quinoa. Brown rice. Corn or whole-wheat flour tortillas or taco shells. Vegetables Lettuce. Spinach. Peas. Beets. Cauliflower. Cabbage. Broccoli. Carrots. Tomatoes. Squash. Eggplant. Herbs. Peppers. Onions. Cucumbers. Brussels sprouts. Fruits Berries. Bananas. Apples. Oranges. Grapes. Papaya. Mango. Pomegranate. Kiwi. Grapefruit. Cherries. Meats and other protein foods Seafood. Poultry without skin. Lean cuts of pork and beef. Tofu. Eggs. Nuts. Beans. Dairy Low-fat or fat-free dairy products, such as yogurt, cottage cheese, and cheese. Beverages Water. Tea. Coffee. Sugar-free or diet soda. Seltzer water. Lowfat or no-fat milk. Milk alternatives, such as soy or almond milk. Fats and oils Olive oil. Canola oil. Sunflower oil. Grapeseed oil. Avocado. Walnuts. Sweets and desserts Sugar-free or low-fat pudding. Sugar-free or low-fat ice cream and other frozen treats. Seasoning and other foods Herbs. Sodium-free spices. Mustard. Relish. Low-fat, low-sugar ketchup. Low-fat, low-sugar barbecue sauce. Low-fat or fat-free mayonnaise. What foods are not recommended? The items listed below may not be a complete list. Talk  with your dietitian about what dietary choices are best for you. Grains Refined white flour and flour products, such as bread, pasta, snack foods, and cereals. Vegetables Canned vegetables. Frozen vegetables with butter or cream sauce. Fruits Fruits canned with syrup. Meats and other protein foods Fatty cuts of meat. Poultry with skin. Breaded or fried meat. Processed meats. Dairy Full-fat yogurt, cheese, or milk. Beverages Sweetened drinks, such as sweet iced tea and soda. Fats and oils Butter. Lard. Ghee. Sweets and desserts Baked goods, such as cake, cupcakes, pastries, cookies, and  cheesecake. Seasoning and other foods Spice mixes with added salt. Ketchup. Barbecue sauce. Mayonnaise. Summary  To prevent diabetes from developing, you may need to make diet and other lifestyle changes to help control blood sugar, improve cholesterol levels, and manage your blood pressure.  Set weight loss goals with the help of your health care team. It is recommended that most people with prediabetes lose 7 percent of their current body weight.  Consider following a Mediterranean diet that includes plenty of fresh fruits and vegetables, whole grains, beans, nuts, seeds, fish, lean meat, low-fat dairy, and healthy oils. This information is not intended to replace advice given to you by your health care provider. Make sure you discuss any questions you have with your health care provider. Document Released: 09/09/2014 Document Revised: 08/17/2018 Document Reviewed: 06/29/2016 Elsevier Patient Education  2020 [employer].

## 2019-04-06 NOTE — Progress Notes (Addendum)
       [address]       [place] [address]             [phone]      4 Days Post-Op Procedure(s) (LRB): CORONARY ARTERY BYPASS GRAFTING (CABG) using LIMA to LAD; Endoscopically harvested right greater saphenous vein to the PLB, Ramus, and Diag 2. (N/A) Subjective: Feels okay this morning. Asking when he can go home.   Objective: Vital signs in last 24 hours: Temp:  [97.9 F (36.6 C)-98.6 F (37 C)] 98.6 F (37 C) (11/28 0300) Pulse Rate:  [75-90] 75 (11/28 0300) Cardiac Rhythm: Atrial fibrillation (11/28 0700) Resp:  [12-19] 18 (11/28 0300) BP: (92-111)/(63-75) 108/69 (11/28 0300) SpO2:  [96 %-100 %] 100 % (11/28 0300) Weight:  [107.8 kg] 107.8 kg (11/28 0300)      Intake/Output from previous day: 11/27 0701 - 11/28 0700 In: 427.4 [P.O.:120; I.V.:307.4] Out: 2250 [Urine:2250] Intake/Output this shift: No intake/output data recorded.  General appearance: alert, cooperative and no distress Heart: irregularly irregular rhythm Lungs: clear to auscultation bilaterally Abdomen: soft, non-tender; bowel sounds normal; no masses,  no organomegaly Extremities: extremities normal, atraumatic, no cyanosis or edema Wound: clean and dry  Lab Results: Recent Labs    04/04/19 0441 04/05/19 0228  WBC 9.2 7.1  HGB 8.8* 9.1*  HCT 27.8* 28.1*  PLT 116* 123*   BMET:  Recent Labs    04/04/19 0441 04/05/19 0228  NA 134* 134*  K 4.3 4.2  CL 104 101  CO2 25 24  GLUCOSE 111* 123*  BUN 20 24*  CREATININE 1.32* 1.24  CALCIUM 8.0* 8.4*    PT/INR: No results for input(s): LABPROT, INR in the last 72 hours. ABG    Component Value Date/Time   PHART 7.286 (L) 04/02/2019 1808   HCO3 16.9 (L) 04/02/2019 1808   TCO2 18 (L) 04/02/2019 1808   ACIDBASEDEF 9.0 (H) 04/02/2019 1808   O2SAT 92.0 04/02/2019 1808   CBG (last 3)  Recent Labs    04/05/19 1647 04/05/19 2114 04/06/19 0608  GLUCAP 93 110* 114*    Assessment/Plan: S/P Procedure(s) (LRB):  CORONARY ARTERY BYPASS GRAFTING (CABG) using LIMA to LAD; Endoscopically harvested right greater saphenous vein to the PLB, Ramus, and Diag 2. (N/A)   1. CV-rate-controlled Afib in the 70s. BP marginal. On metoprolol 25mg  BID. Continue ASA, and statin. 2. Pulm- Continue [place] at 2L. Wean as able. Continue to encourage incentive spirometer.  3. Renal-creatinine 1.24, electrolytes okay 4. H and H 9.1/28.1, expected acute blood loss anemia 5. Endo-blood glucose well controlled and not a diabetic-can discontinue CBGs  Plan: He remains in rate-controlled afib. I will transition his IV Amio to PO today. Discussed the possible need for anticoagulation if he remains in atrial fibrillation.       LOS: 8 days    Elgie Collard 04/06/2019   Agree with above Remains in afib, on amio Will start eliquis today [place]

## 2019-04-06 NOTE — Progress Notes (Signed)
Progress Note  Patient Name: Jake Dunn Date of Encounter: 04/06/2019  Primary Cardiologist: Peter Martinique, MD   Subjective   Remains in afib but rate controlled.  Denies any CP or SOB  Inpatient Medications    Scheduled Meds: . acetaminophen  1,000 mg Oral Q6H   Or  . acetaminophen (TYLENOL) oral liquid 160 mg/5 mL  1,000 mg Per Tube Q6H  . amiodarone  400 mg Oral BID  . aspirin  325 mg Oral Daily  . atorvastatin  80 mg Oral QPM  . bisacodyl  10 mg Oral Daily   Or  . bisacodyl  10 mg Rectal Daily  . Chlorhexidine Gluconate Cloth  6 each Topical Daily  . docusate sodium  200 mg Oral Daily  . insulin aspart  0-24 Units Subcutaneous TID WC & HS  . mouth rinse  15 mL Mouth Rinse BID  . metoprolol tartrate  25 mg Oral BID   Or  . metoprolol tartrate  12.5 mg Per Tube BID  . pantoprazole  40 mg Oral Daily  . sodium chloride flush  10-40 mL Intracatheter Q12H  . sodium chloride flush  3 mL Intravenous Q12H  . sodium chloride flush  3 mL Intravenous Q12H   Continuous Infusions: . sodium chloride Stopped (04/03/19 0954)  . sodium chloride    . sodium chloride 20 mL/hr at 04/02/19 1353  . sodium chloride    . amiodarone 30 mg/hr (04/06/19 0610)  . lactated ringers Stopped (04/03/19 0514)  . lactated ringers 20 mL/hr at 04/04/19 0600   PRN Meds: sodium chloride, sodium chloride, ALPRAZolam, metoprolol tartrate, morphine injection, ondansetron (ZOFRAN) IV, oxyCODONE, sodium chloride flush, sodium chloride flush, sodium chloride flush, traMADol   Vital Signs    Vitals:   04/05/19 1648 04/05/19 2046 04/05/19 2352 04/06/19 0300  BP: 111/72 104/63 92/65 108/69  Pulse: 80 84 75 75  Resp: 19 12 19 18   Temp: 97.9 F (36.6 C) 98.4 F (36.9 C) 98.3 F (36.8 C) 98.6 F (37 C)  TempSrc: Oral Oral Oral Oral  SpO2: 100% 100% 96% 100%  Weight:    107.8 kg  Height:        Intake/Output Summary (Last 24 hours) at 04/06/2019 0912 Last data filed at 04/06/2019 0350 Gross  per 24 hour  Intake 307.42 ml  Output 2250 ml  Net -1942.58 ml   Filed Weights   04/03/19 0600 04/04/19 0500 04/06/19 0300  Weight: 103.9 kg 108.3 kg 107.8 kg    Telemetry    Atrial fibrillation with CVR Personally Reviewed  ECG    No new EKG - Personally Reviewed  Physical Exam   GEN: Well nourished, well developed in no acute distress HEENT: Normal NECK: No JVD; No carotid bruits LYMPHATICS: No lymphadenopathy CARDIAC:irregularly irregular, no murmurs, rubs, gallops RESPIRATORY:  Clear to auscultation without rales, wheezing or rhonchi  ABDOMEN: Soft, non-tender, non-distended MUSCULOSKELETAL:  No edema; No deformity  SKIN: Warm and dry NEUROLOGIC:  Alert and oriented x 3 PSYCHIATRIC:  Normal affect    Labs    Chemistry Recent Labs  Lab 04/01/19 0455  04/03/19 1522 04/04/19 0441 04/05/19 0228  NA 138   < > 136 134* 134*  K 3.7   < > 3.8 4.3 4.2  CL 105   < > 103 104 101  CO2 24   < > 24 25 24   GLUCOSE 101*   < > 119* 111* 123*  BUN 18   < > 17 20 24*  CREATININE 1.31*   < > 1.16 1.32* 1.24  CALCIUM 9.1   < > 8.1* 8.0* 8.4*  PROT 6.7  --   --   --   --   ALBUMIN 3.7  --   --   --   --   AST 12*  --   --   --   --   ALT 15  --   --   --   --   ALKPHOS 79  --   --   --   --   BILITOT 0.5  --   --   --   --   GFRNONAA 53*   < > >60 52* 56*  GFRAA >60   < > >60 >60 >60  ANIONGAP 9   < > 9 5 9    < > = values in this interval not displayed.     Hematology Recent Labs  Lab 04/03/19 1522 04/04/19 0441 04/05/19 0228  WBC 10.2 9.2 7.1  RBC 3.11* 2.81* 2.93*  HGB 9.7* 8.8* 9.1*  HCT 30.0* 27.8* 28.1*  MCV 96.5 98.9 95.9  MCH 31.2 31.3 31.1  MCHC 32.3 31.7 32.4  RDW 13.2 13.4 13.2  PLT 123* 116* 123*    Cardiac EnzymesNo results for input(s): TROPONINI in the last 168 hours. No results for input(s): TROPIPOC in the last 168 hours.   BNP No results for input(s): BNP, PROBNP in the last 168 hours.   DDimer No results for input(s): DDIMER in the  last 168 hours.   Radiology    No results found.  Cardiac Studies   03/30/2019 Echocardiogram:   IMPRESSIONS    1. Left ventricular ejection fraction, by visual estimation, is 55 to 60%. The left ventricle has normal function. There is no left ventricular hypertrophy.  2. Global right ventricle has mildly reduced systolic function.The right ventricular size is mildly enlarged. No increase in right ventricular wall thickness.  3. Left atrial size was normal.  4. Right atrial size was mildly dilated.  5. The mitral valve is normal in structure. No evidence of mitral valve regurgitation.  6. The tricuspid valve is normal in structure. Tricuspid valve regurgitation is trivial.  7. The aortic valve is normal in structure. Aortic valve regurgitation is not visualized. No evidence of aortic valve sclerosis or stenosis.  8. The pulmonic valve was grossly normal. Pulmonic valve regurgitation is not visualized.  9. Aortic dilatation noted. 10. There is mild dilatation of the ascending aorta measuring 38 mm. 11. Normal pulmonary artery systolic pressure. 12. The atrial septum is grossly normal.  03/26/2019 Cardiac catheterization:  Prox RCA lesion is 45% stenosed.  2nd RPL lesion is 80% stenosed.  Ost LM to Mid LM lesion is 50% stenosed.  Ost Cx to Mid Cx lesion is 80% stenosed.  Ramus lesion is 90% stenosed.  Mid LAD lesion is 70% stenosed.  2nd Diag lesion is 60% stenosed.  The left ventricular systolic function is normal.  LV end diastolic pressure is normal.  The left ventricular ejection fraction is 55-65% by visual estimate.   1. Three vessel obstructive, Calcific CAD 2. Normal LV function 3. Normal LVEDP  Plan: recommend referral to CT surgery for CABG. Will stop Plavix.  Patient Profile     78 y.o. male with a history of CAD, GERD, HTN, HLD, renal cell CA s/p left nephrectomy and recent cardiac cath for chest pain on 11/17 that showed 3 vessel disease  presented for CABG.  Assessment & Plan  1. CAD/Unstable angina: -s/p CABG -denies any anginal CP or SOB -continue ASA, statin and BB   2. Post-op atrial fibrillation:   -remains in afib this am but rate controlled -currently on IV Amio -change Amio to PO 400mg  BID for 1 week, 400mg  daily for 2 weeks and then 200mg  daily -add Coumadin or Eliquis (per surgery preference) for CHADS2VASC score of 5.    3.  HTN -BP controlled -continue Lopressor 25mg  BID  4.  HLD -LDL goal < 70 -LDL 34 this admit -continue atorvastatin 80mg  daily  [facility] will sign off.   Medication Recommendations:  Amiodarone 400mg  BID for 1 week, 400mg  daily for 2 weeks and then 200mg  daily, Eliquis or Coumadin (surgery choice), ASA 325mg  daily, Lipitor 80mg  daily, Lopressor 25mg  BID. Other recommendations (labs, testing, etc):  none Follow up as an outpatient:  Dr. Martinique on 12/7   For questions or updates, please contact [place] Please consult [email or web address] for contact info under Cardiology/STEMI.      Signed, Fransico Him, MD  04/06/2019, 9:12 AM

## 2019-04-07 LAB — GLUCOSE, CAPILLARY: Glucose-Capillary: 118 mg/dL — ABNORMAL HIGH (ref 70–99)

## 2019-04-07 MED ORDER — LEVALBUTEROL HCL 0.63 MG/3ML IN NEBU: 0.6300 mg | INHALATION_SOLUTION | Freq: Four times a day (QID) | RESPIRATORY_TRACT | Status: DC | PRN

## 2019-04-07 NOTE — Progress Notes (Signed)
Patient ambulated approximately 1000 feet in hallway on room air without a rolling a walker, tolerated well.

## 2019-04-07 NOTE — Progress Notes (Addendum)
      [address]       [place] [address]             [phone]      5 Days Post-Op Procedure(s) (LRB): CORONARY ARTERY BYPASS GRAFTING (CABG) using LIMA to LAD; Endoscopically harvested right greater saphenous vein to the PLB, Ramus, and Diag 2. (N/A) Subjective: Feels okay this morning.   Objective: Vital signs in last 24 hours: Temp:  [97.7 F (36.5 C)-99.1 F (37.3 C)] 98.1 F (36.7 C) (11/29 0457) Pulse Rate:  [73-89] 79 (11/29 0457) Cardiac Rhythm: Atrial fibrillation (11/28 2042) Resp:  [17-23] 20 (11/29 0457) BP: (93-123)/(60-72) 101/69 (11/29 0457) SpO2:  [95 %-100 %] 97 % (11/29 0457) Weight:  ST:[id number] kg] 107 kg (11/29 0457)     Intake/Output from previous day: 11/28 0701 - 11/29 0700 In: 330 [P.O.:330] Out: 1480 [Urine:1480] Intake/Output this shift: No intake/output data recorded.  General appearance: alert, cooperative and no distress Heart: irregularly irregular rhythm Lungs: clear to auscultation bilaterally and expiratory wheezing in the left lower lobe Abdomen: soft, non-tender; bowel sounds normal; no masses,  no organomegaly Extremities: extremities normal, atraumatic, no cyanosis or edema Wound: clean and dry  Lab Results: Recent Labs    04/05/19 0228  WBC 7.1  HGB 9.1*  HCT 28.1*  PLT 123*   BMET:  Recent Labs    04/05/19 0228  NA 134*  K 4.2  CL 101  CO2 24  GLUCOSE 123*  BUN 24*  CREATININE 1.24  CALCIUM 8.4*    PT/INR: No results for input(s): LABPROT, INR in the last 72 hours. ABG    Component Value Date/Time   PHART 7.286 (L) 04/02/2019 1808   HCO3 16.9 (L) 04/02/2019 1808   TCO2 18 (L) 04/02/2019 1808   ACIDBASEDEF 9.0 (H) 04/02/2019 1808   O2SAT 92.0 04/02/2019 1808   CBG (last 3)  Recent Labs    04/06/19 0608 04/06/19 2135 04/07/19 0634  GLUCAP 114* 135* 118*    Assessment/Plan: S/P Procedure(s) (LRB): CORONARY ARTERY BYPASS GRAFTING (CABG) using LIMA to LAD; Endoscopically harvested  right greater saphenous vein to the PLB, Ramus, and Diag 2. (N/A)   1. CV-rate-controlled Afib in the 70s. BP marginal. On metoprolol 25mg  BID. Continue ASA, and statin. Eliquis started yesterday for CHADSVASC score of 5. Full dose ASA and stop home Plavix. Discussed with Cardiology.  2. Pulm- Continue [place] at 2L. Wean as able. Continue to encourage incentive spirometer.  3. Renal-creatinine 1.24, electrolytes okay 4. H and H 9.1/28.1, expected acute blood loss anemia 5. Endo-CBGs discontinued  Plan: 6 minute walk test to assess need for home oxygen. Will place [facility] orders for RN, PT,OT. Assess if home oxygen is needed. Xopenex treatments added. Hopefully home later today vs. Tomorrow.     LOS: 9 days    Jake Dunn 04/07/2019   Agree with above Home tomorrow

## 2019-04-07 NOTE — Progress Notes (Signed)
Pt ambulated approximately 1000 feet in hallway on room air maintaining O2 sats of 90% or greater.  Pt tolerated well.

## 2019-04-08 MED ORDER — POTASSIUM CHLORIDE CRYS ER 20 MEQ PO TBCR
20.0000 meq | EXTENDED_RELEASE_TABLET | Freq: Every day | ORAL | Status: DC
  Administered 2019-04-08: 20 meq via ORAL
  Filled 2019-04-08: qty 1

## 2019-04-08 MED ORDER — POTASSIUM CHLORIDE CRYS ER 20 MEQ PO TBCR: 20.0000 meq | EXTENDED_RELEASE_TABLET | Freq: Every day | ORAL | 0 refills | Status: DC

## 2019-04-08 MED ORDER — APIXABAN 5 MG PO TABS: 5.0000 mg | ORAL_TABLET | Freq: Two times a day (BID) | ORAL | 1 refills | Status: DC

## 2019-04-08 MED ORDER — FUROSEMIDE 40 MG PO TABS: 40.0000 mg | ORAL_TABLET | Freq: Every day | ORAL | 1 refills | Status: DC

## 2019-04-08 MED ORDER — ASPIRIN 325 MG PO TABS: 325.0000 mg | ORAL_TABLET | Freq: Every day | ORAL | Status: DC

## 2019-04-08 MED ORDER — FUROSEMIDE 40 MG PO TABS
40.0000 mg | ORAL_TABLET | Freq: Every day | ORAL | Status: DC
  Administered 2019-04-08: 40 mg via ORAL
  Filled 2019-04-08: qty 1

## 2019-04-08 MED ORDER — OXYCODONE HCL 5 MG PO TABS: 5.0000 mg | ORAL_TABLET | Freq: Four times a day (QID) | ORAL | 0 refills | Status: DC | PRN

## 2019-04-08 MED ORDER — AMIODARONE HCL 400 MG PO TABS: 200.0000 mg | ORAL_TABLET | Freq: Two times a day (BID) | ORAL | 1 refills | Status: DC

## 2019-04-08 NOTE — Progress Notes (Signed)
CARDIAC REHAB PHASE I   D/c education completed with pt and wife. Pt educated on site care and monitoring incisions daily. Encouraged continued ambulation, IS use, and sternal precautions. Pt given in-the-tube sheet along with heart healthy diet. Reviewed restrictions and site care. Will refer to CRP II [place]. Pt is interested in participating in Virtual Cardiac and Pulmonary Rehab. Pt advised that Virtual Cardiac and Pulmonary Rehab is provided at no cost to the patient.  Checklist:  1. Pt has smart device  ie smartphone and/or ipad for downloading an app  Yes 2. Reliable internet/wifi service    Yes 3. Understands how to use their smartphone and navigate within an app.  Yes  Pt verbalized understanding and is in agreement.  [id number] Rufina Falco, RN BSN 04/08/2019 10:47 AM

## 2019-04-08 NOTE — Progress Notes (Signed)
Pt provided discharge instructions and education. IV removed and intact. Telebox removed. Pt denies complaints. Vitals stable. Pt has all belongings. Pt tx via wheelchair to meet ride. Jerald Kief, RN

## 2019-04-08 NOTE — TOC Transition Note (Signed)
Transition of Care [facility]) - CM/SW Discharge Note [name] RN, BSN Transitions of Care Unit 4E- RN Case Manager [phone]   Patient Details  Name: Jake Dunn MRN: [id number] Date of Birth: December 01, 1940  Transition of Care [facility]) CM/SW Contact:  [name], RN Phone Number: 04/08/2019, 11:27 AM   Clinical Narrative:    Pt stable for transition home today s/p CABG, orders placed for HHRN/PT/OT- pt from home with wife- spoke with pt at bedside for transition of care needs- list provided for [facility] choice Per CMS guidelines from medicare.gov website with star ratings (copy placed in shadow chart)- per pt and wife they have used [facility] in past and would like to use them again for services- per pt he has all needed DME at home- no new needs at this time. Wife to transport home. Address, phone # and PCP all confirmed with pt in epic. Call made to [facility] with [facility] for [facility] referral- referral has been accepted for RN/PT/OT needs-    Final next level of care: [place] Barriers to Discharge: No Barriers Identified   Patient Goals and CMS Choice Patient states their goals for this hospitalization and ongoing recovery are:: return home and get better CMS Medicare.gov Compare Post Acute Care list provided to:: Patient Choice offered to / list presented to : Patient, Spouse  Discharge Placement               Home with [facility]        Discharge Plan and Services   Discharge Planning Services: CM Consult Post Acute Care Choice: Home Health          DME Arranged: N/A DME Agency: NA       HH Arranged: RN, PT, OT [place] Agency: [place] ([place]) Date HH Agency Contacted: 04/08/19 Time [place]: 1030 Representative spoke with at [facility]: [place] ([place]) Interventions     Readmission Risk Interventions Readmission Risk Prevention Plan 04/08/2019  Post Dischage Appt Complete  Medication Screening Complete   Transportation Screening Complete  Some recent data might be hidden

## 2019-04-08 NOTE — Telephone Encounter (Signed)
Is the appointment with Dr.Jordan on 12/07 okay for this? Will route to MD to clarify. Thank you!

## 2019-04-08 NOTE — Telephone Encounter (Signed)
Ok with me  Peter Jordan MD, FACC  

## 2019-04-08 NOTE — Progress Notes (Signed)
Physical Therapy Treatment Patient Details Name: Jake Dunn MRN: [id number] DOB: 25-Jan-1941 Today's Date: 04/08/2019    History of Present Illness Pt is a 78 y/o male s/p CABG X 4, LIMA LAD, RSVG PLV, Ramus, and D2  (T graft off the ramus vein graft) secondary to unstable angina. PMH including but not limited to CAD, HTN and stroke.    PT Comments    Patient progressing well towards PT goals. Tolerated bed mobility with HOB flat and no rails to simulate home and needs Min A for trunk support. Reviewed sternal precautions and importance of walking/mobility at home. Tolerated stair training with min guard assist for balance/safety. Noted to need multiple short standing rest breaks due to 2/4 DOE but VSS. Eager to return home. Will continue to follow.    Follow Up Recommendations  No PT follow up;Supervision for mobility/OOB     Equipment Recommendations  Rolling walker with 5" wheels    Recommendations for Other Services       Precautions / Restrictions Precautions Precautions: Fall;Sternal Precaution Comments: Reviewed sternal precautions Restrictions Weight Bearing Restrictions: Yes Other Position/Activity Restrictions: sternal precautions    Mobility  Bed Mobility Overal bed mobility: Needs Assistance Bed Mobility: Supine to Sit     Supine to sit: Min assist     General bed mobility comments: Able to bring LEs to EOB, assist needed with trunk. HOB flat to simulate home and no rails.  Transfers Overall transfer level: Needs assistance Equipment used: None Transfers: Sit to/from Stand Sit to Stand: Min guard;From elevated surface         General transfer comment: Min guard for safety. Stood from elevated bed height to simulate home bed height.  Ambulation/Gait Ambulation/Gait assistance: Min guard Gait Distance (Feet): 300 Feet Assistive device: None Gait Pattern/deviations: Step-through pattern;Decreased stride length Gait velocity: decreased   General  Gait Details: Slow, mildly unsteady gait with a few standing rest breaks due to fatigue. 2/4 DOE. No LOB. VSS.   Stairs Stairs: Yes Stairs assistance: Min guard Stair Management: Step to pattern Number of Stairs: 3 General stair comments: Cues for technique/safety; increased WOB post stair training.   Wheelchair Mobility    Modified Rankin (Stroke Patients Only)       Balance Overall balance assessment: Needs assistance Sitting-balance support: Feet supported;No upper extremity supported Sitting balance-Leahy Scale: Good     Standing balance support: During functional activity Standing balance-Leahy Scale: Fair Standing balance comment: Close Min guard for safety                            Cognition Arousal/Alertness: Awake/alert Behavior During Therapy: WFL for tasks assessed/performed Overall Cognitive Status: Within Functional Limits for tasks assessed                                        Exercises      General Comments        Pertinent Vitals/Pain Pain Assessment: Faces Faces Pain Scale: Hurts a little bit Pain Location: incision site on chest Pain Descriptors / Indicators: Sore Pain Intervention(s): Monitored during session    Home Living                      Prior Function            PT Goals (current goals can now be found  in the care plan section) Progress towards PT goals: Progressing toward goals    Frequency    Min 3X/week      PT Plan Current plan remains appropriate    Co-evaluation              AM-PAC PT "6 Clicks" Mobility   Outcome Measure  Help needed turning from your back to your side while in a flat bed without using bedrails?: A Little Help needed moving from lying on your back to sitting on the side of a flat bed without using bedrails?: A Little Help needed moving to and from a bed to a chair (including a wheelchair)?: A Little Help needed standing up from a chair using your  arms (e.g., wheelchair or bedside chair)?: A Little Help needed to walk in hospital room?: A Little Help needed climbing 3-5 steps with a railing? : A Little 6 Click Score: 18    End of Session Equipment Utilized During Treatment: Gait belt Activity Tolerance: Patient tolerated treatment well Patient left: in bed;with call bell/phone within reach(sitting EOB.) Nurse Communication: Mobility status PT Visit Diagnosis: Other abnormalities of gait and mobility (R26.89)     Time: [id number] PT Time Calculation (min) (ACUTE ONLY): 19 min  Charges:  $Gait Training: 8-22 mins                     Marisa Severin, PT, DPT Acute Rehabilitation Services Pager [phone] Office [phone]       Marguarite Arbour A Sabra Heck 04/08/2019, 10:17 AM

## 2019-04-08 NOTE — Progress Notes (Addendum)
      [address]       [place] [address]             [phone]        6 Days Post-Op Procedure(s) (LRB): CORONARY ARTERY BYPASS GRAFTING (CABG) using LIMA to LAD; Endoscopically harvested right greater saphenous vein to the PLB, Ramus, and Diag 2. (N/A)  Subjective: Patient had bowel movement yesterday. He has no specific complaints this am. He wants to go home.  Objective: Vital signs in last 24 hours: Temp:  [98 F (36.7 C)-98.4 F (36.9 C)] 98 F (36.7 C) (11/30 0525) Pulse Rate:  [70-77] 77 (11/30 0525) Cardiac Rhythm: (P) Normal sinus rhythm (11/30 0525) Resp:  [18-20] 20 (11/30 0525) BP: (96-120)/(68-85) 120/85 (11/30 0525) SpO2:  [93 %-95 %] 95 % (11/30 0525) Weight:  [106.4 kg] 106.4 kg (11/30 0525)  Pre op weight 102.5 kg Current Weight  04/08/19 106.4 kg      Intake/Output from previous day: 11/29 0701 - 11/30 0700 In: 540 [P.O.:540] Out: 400 [Urine:400]   Physical Exam:  Cardiovascular: RRR Pulmonary: Slightly diminished bibasilar breath sounds Abdomen: Soft, non tender, obese,bowel sounds present. Extremities: Mild bilateral lower extremity edema. Wounds: Clean and dry.  No erythema or signs of infection. ++ ecchymosis right thigh  Lab Results: CBC:No results for input(s): WBC, HGB, HCT, PLT in the last 72 hours. BMET: No results for input(s): NA, K, CL, CO2, GLUCOSE, BUN, CREATININE, CALCIUM in the last 72 hours.  PT/INR:  Lab Results  Component Value Date   INR 1.3 (H) 04/02/2019   INR 1.0 03/29/2019   INR 0.9 03/20/2019   ABG:  INR: Will add last result for INR, ABG once components are confirmed Will add last 4 CBG results once components are confirmed  Assessment/Plan:  1. CV - A fib with CVR. He is in SR with HR in the 70's at the time of my exam. On Amiodarone 400 mg bid, Lopressor 25 mg bid, and Apixaban 5 mg bid.  Per cardiology,  Plavix stopped and full dose aspirin to be continued.  2.  Pulmonary - On room  air. He will not need home oxygen. Encourage incentive spirometer. 3. Volume Overload - Will give Lasix 40 mg daily for next several days 4.  Acute blood loss anemia - Last H and H 9.1 and 28.1 5. CBGs 114/135/118. Pre op HGA1C 5.8. He likely has pre diabetes. Will provide nutritional information and have him follow up with his medical doctor for further surveillance 6. Mild thrombocytopenia-last platelets up to 123,000 7. Chest tube sutures will be removed in the office at appointment on 12/04 8. As discussed with Dr. Kipp Brood, will discharge  Wilmar Prabhakar M ZimmermanPA-C 04/08/2019,7:43 AM

## 2019-04-08 NOTE — Telephone Encounter (Signed)
Jake Dunn will call patient with TOC call once discharged. Currently still admitted.

## 2019-04-09 DIAGNOSIS — M48061 Spinal stenosis, lumbar region without neurogenic claudication: Secondary | ICD-10-CM | POA: Diagnosis not present

## 2019-04-09 DIAGNOSIS — Z48812 Encounter for surgical aftercare following surgery on the circulatory system: Secondary | ICD-10-CM | POA: Diagnosis not present

## 2019-04-09 DIAGNOSIS — M109 Gout, unspecified: Secondary | ICD-10-CM | POA: Diagnosis not present

## 2019-04-09 DIAGNOSIS — I10 Essential (primary) hypertension: Secondary | ICD-10-CM | POA: Diagnosis not present

## 2019-04-09 DIAGNOSIS — G8929 Other chronic pain: Secondary | ICD-10-CM | POA: Diagnosis not present

## 2019-04-09 DIAGNOSIS — K219 Gastro-esophageal reflux disease without esophagitis: Secondary | ICD-10-CM | POA: Diagnosis not present

## 2019-04-09 DIAGNOSIS — I951 Orthostatic hypotension: Secondary | ICD-10-CM | POA: Diagnosis not present

## 2019-04-09 DIAGNOSIS — R69 Illness, unspecified: Secondary | ICD-10-CM | POA: Diagnosis not present

## 2019-04-09 DIAGNOSIS — I4891 Unspecified atrial fibrillation: Secondary | ICD-10-CM | POA: Diagnosis not present

## 2019-04-09 DIAGNOSIS — I25110 Atherosclerotic heart disease of native coronary artery with unstable angina pectoris: Secondary | ICD-10-CM | POA: Diagnosis not present

## 2019-04-10 DIAGNOSIS — K219 Gastro-esophageal reflux disease without esophagitis: Secondary | ICD-10-CM | POA: Diagnosis not present

## 2019-04-10 DIAGNOSIS — I10 Essential (primary) hypertension: Secondary | ICD-10-CM | POA: Diagnosis not present

## 2019-04-10 DIAGNOSIS — I951 Orthostatic hypotension: Secondary | ICD-10-CM | POA: Diagnosis not present

## 2019-04-10 DIAGNOSIS — G8929 Other chronic pain: Secondary | ICD-10-CM | POA: Diagnosis not present

## 2019-04-10 DIAGNOSIS — I4891 Unspecified atrial fibrillation: Secondary | ICD-10-CM | POA: Diagnosis not present

## 2019-04-10 DIAGNOSIS — M48061 Spinal stenosis, lumbar region without neurogenic claudication: Secondary | ICD-10-CM | POA: Diagnosis not present

## 2019-04-10 DIAGNOSIS — R69 Illness, unspecified: Secondary | ICD-10-CM | POA: Diagnosis not present

## 2019-04-10 DIAGNOSIS — I25110 Atherosclerotic heart disease of native coronary artery with unstable angina pectoris: Secondary | ICD-10-CM | POA: Diagnosis not present

## 2019-04-10 DIAGNOSIS — Z48812 Encounter for surgical aftercare following surgery on the circulatory system: Secondary | ICD-10-CM | POA: Diagnosis not present

## 2019-04-10 DIAGNOSIS — M109 Gout, unspecified: Secondary | ICD-10-CM | POA: Diagnosis not present

## 2019-04-11 ENCOUNTER — Other Ambulatory Visit: Payer: Self-pay | Admitting: Thoracic Surgery (Cardiothoracic Vascular Surgery)

## 2019-04-11 DIAGNOSIS — Z951 Presence of aortocoronary bypass graft: Secondary | ICD-10-CM

## 2019-04-11 IMAGING — US IR FLUORO GUIDE CV LINE*L*
1 series · 3 of 3 positions shown · non-contrast
Comparison: none

INDICATION: Right shoulder infection. Need for central venous access for long
term intravenous antibiotics.

EXAM:
LEFT UPPER ARM PICC LINE PLACEMENT WITH ULTRASOUND AND FLUOROSCOPIC
GUIDANCE
MEDICATIONS:
1% Lidocaine = 3 mL
ANESTHESIA/SEDATION:
No sedation medication given.
FLUOROSCOPY TIME:  Fluoroscopy Time: 0 minutes 18 seconds.
COMPLICATIONS:
None immediate.
TECHNIQUE: The procedure, risks, benefits, and alternatives were explained to
the patient and informed written consent was obtained. A timeout was
performed prior to the initiation of the procedure.

[Series 1: ir fluoro/shunt/fist · 3 of 3 slices shown]
[im 1/3]
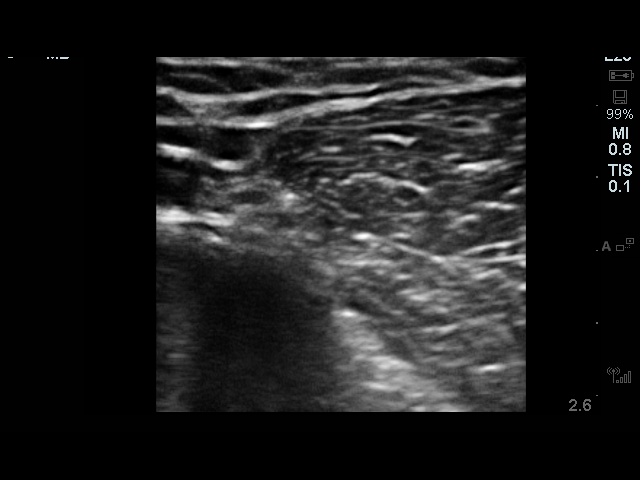
[im 2/3]
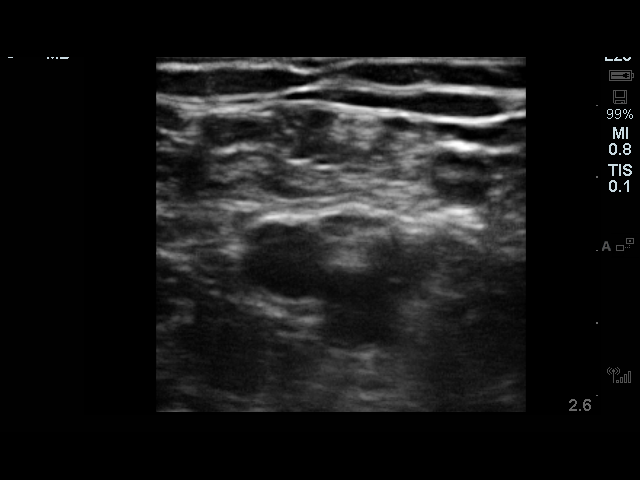
[im 3/3]
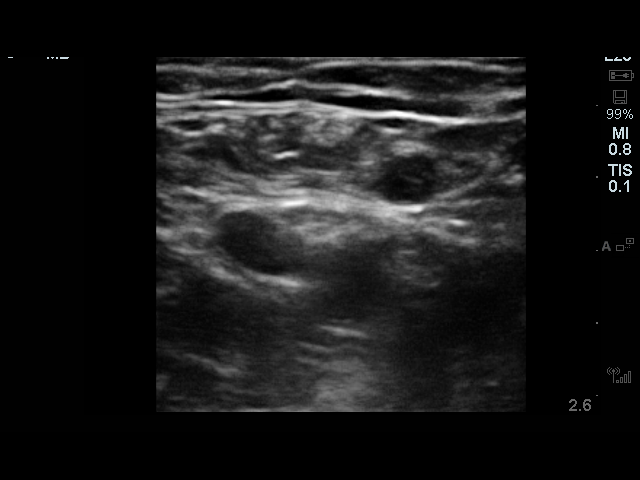

[3 of 3 positions shown; findings below may reference images not displayed]

The left upper extremity was prepped with chlorhexidine in a sterile
fashion, and a sterile drape was applied covering the operative
field. Maximum barrier sterile technique with sterile gowns and
gloves were used for the procedure. A timeout was performed prior to
the initiation of the procedure. Local anesthesia was provided with
1% lidocaine.

Under direct ultrasound guidance, the left basilic vein was accessed
with a micropuncture kit after the overlying soft tissues were
anesthetized with 1% lidocaine. An ultrasound image was saved for
documentation purposes. A guidewire was advanced to the level of the
superior caval-atrial junction for measurement purposes and the PICC
line was cut to length. A peel-away sheath was placed and a 46 cm, 5
French, single lumen was inserted to level of the superior
caval-atrial junction. A post procedure spot fluoroscopic was
obtained. The catheter easily aspirated and flushed and was sutured
in place. A dressing was placed. The patient tolerated the procedure
well without immediate post procedural complication.
FINDINGS: After catheter placement, the tip lies within the superior
cavoatrial junction. The catheter aspirates and flushes normally and
is ready for immediate use.
IMPRESSION: Successful ultrasound and fluoroscopic guided placement of a left
basilic vein approach, 46 cm, 5 French, dual lumen PICC with tip at
the superior caval-atrial junction. The PICC line is ready for
immediate use.

## 2019-04-12 ENCOUNTER — Other Ambulatory Visit: Payer: Self-pay

## 2019-04-12 ENCOUNTER — Ambulatory Visit (INDEPENDENT_AMBULATORY_CARE_PROVIDER_SITE_OTHER): Payer: Self-pay | Admitting: Thoracic Surgery (Cardiothoracic Vascular Surgery)

## 2019-04-12 ENCOUNTER — Encounter: Payer: Self-pay | Admitting: Thoracic Surgery (Cardiothoracic Vascular Surgery)

## 2019-04-12 VITALS — BP 110/69 | HR 79 | Temp 97.7°F | Resp 20 | Ht 67.0 in | Wt 224.0 lb

## 2019-04-12 DIAGNOSIS — Z951 Presence of aortocoronary bypass graft: Secondary | ICD-10-CM

## 2019-04-12 DIAGNOSIS — I2510 Atherosclerotic heart disease of native coronary artery without angina pectoris: Secondary | ICD-10-CM

## 2019-04-12 NOTE — Progress Notes (Signed)
      [address]       [place] [address]             [phone]        Jake Dunn [place] Medical Record [id number] Date of Birth: 1941/04/12  Referring: Martinique, Peter M, MD Primary Care: Harlan Stains, MD Primary Cardiologist:Peter Martinique, MD  Reason for visit:   follow-up  History of Present Illness:     Mr. Oki presents for his 1st follow-up.  He has done well.  He denies any chest pain, or dyspnea with walking  Physical Exam: BP 110/69   Pulse 79   Temp 97.7 F (36.5 C) (Skin)   Resp 20   Ht 5\' 7"  (1.702 m)   Wt 224 lb (101.6 kg)   BMI 35.08 kg/m   Alert NAD Incision clean.  Sternum stable Abdomen soft, ND trace peripheral edema   Diagnostic Studies & Laboratory data:      Assessment / Plan:   S/p CABG doing well Will follow-up in 1 month   Taiwana Willison O Jesaiah Fabiano 04/12/2019 3:07 PM

## 2019-04-13 NOTE — Progress Notes (Signed)
Cardiology Office Note   Date:  04/15/2019   ID:  Jake Dunn, Jake Dunn 09-May-1941, MRN [id number]  PCP:  Jake Stains, MD  Cardiologist:   Saidee Geremia Martinique, MD   Chief Complaint  Patient presents with  . Coronary Artery Disease  . Atrial Fibrillation      History of Present Illness: Jake Dunn is a 78 y.o. male who is seen for follow up s/p CABG. He has a PMH of CAD  (70% 90rd diag '14), GERD, HTN, HL, renal cell CA s/p left nephrectomy. Last cardiac catheterization in 2014 showed nonobstructive CAD with 70% lesion in third diagonal that was treated medically. He is fairly active and recently undergoing physical therapy after his right shoulder surgery 5 months ago. He presented with left sided chest pressure on 08/15/2016 while at Urology office. BP was quite high that day. He underwent cardiac catheterization on 08/16/2016, this revealed two-vessel CAD with moderate calcified stenosis in the mid LAD, second diagonal, ramus intermedius, ostial and proximal left circumflex and a severe stenosis of the second PLA branch of the RCA. Overall normal LVEDP and normal LVEF. Recommended medical therapy and outpatient exercise stress Myoview. While the LAD and the diagonal only showed moderate progression compared to the 2010/2014 studies, the left circumflex and intermediate stenosis had progressed. Echocardiogram obtained on 08/17/2016 showed EF 0000000, grade 2 diastolic dysfunction, severely dilated left atrium, PA peak pressure 30 mmHg. Lexiscan Myoview obtained on 08/23/2016 showed EF 51%, no perfusion defect at stress, overall low risk study.   He was seen in November with worsening symptoms of chest pain and dyspnea. This led to a cardiac cath showing 3 vessel obstructive CAD. He underwent CABG on 04/02/19 by Dr Kipp Brood. CABG X 4.  LIMA LAD, RSVG PLV, Ramus, and D2  (T graft off the ramus vein graft). Post op course complicated by Afib. He was loaded with amiodarone.   Since discharge he has  done well. No pain. Still has a productive cough but this is improved. No palpitations or dizziness. BP has been very good.     Past Medical History:  Diagnosis Date  . Adenomatous polyp   . Anxiety   . Arthritis    "knees, ankles, shoulders" (08/15/2016)  . CAD (coronary artery disease)    s/p cath in 2014 showing significant stenosis in the diagonal side branches and in the RV marginal branch. These vessels were small and diffusely diseased. He is managed medically. 08/16/16 Cath, no disease progression  . Chronic lower back pain   . Disc disease, degenerative, cervical   . GERD (gastroesophageal reflux disease)   . Gout   . Hyperlipidemia   . Hypertension   . Osteoarthritis   . Renal cell carcinoma    left kidney removal  . Stroke [facility])    MINI STROKE 15+ YRS AGO, no residual  . Stroke ([place])    "I've had 2 or 3"; denies residual on 08/15/2016  . Syncope and collapse     Past Surgical History:  Procedure Laterality Date  . ANTERIOR CERVICAL DECOMP/DISCECTOMY FUSION  02/2004; 04/2005   [name] 5/14/2012Marland Kitchen [name] 09/20/2010  . APPLICATION OF ROBOTIC ASSISTANCE FOR SPINAL PROCEDURE N/A 01/02/2018   Procedure: APPLICATION OF ROBOTIC ASSISTANCE FOR SPINAL PROCEDURE;  Surgeon: Kristeen Miss, MD;  Location: [place];  Service: Neurosurgery;  Laterality: N/A;  . BACK SURGERY    . CARDIAC CATHETERIZATION  12/14/2008   ef 50-55%. SHOWED SIGNIFICANT STENOSIS AND TO DIAGONAL SIDE BRANCHES AND IN THE  RIGHT VENTRICULAR MARGINAL BRANCH. THESE VESSELS WERE SMALL AND DIFFUSELY DISEASED  . CARDIOVASCULAR STRESS TEST  12/10/2009   EF 61%. EKG negative. Mild peri ischemia. Managed medically.   . CARPAL TUNNEL RELEASE Right 11/2005   [name] 09/20/2010  . CARPAL TUNNEL RELEASE Left 06/2007   [name] 5/132012  . COLONOSCOPY  11/2004   [name] 09/20/2010  . COLONOSCOPY W/ BIOPSIES AND POLYPECTOMY  09/2002   [name] 09/20/2010  . CORONARY ARTERY BYPASS GRAFT N/A 04/02/2019   Procedure: CORONARY ARTERY BYPASS  GRAFTING (CABG) using LIMA to LAD; Endoscopically harvested right greater saphenous vein to the PLB, Ramus, and Diag 2.;  Surgeon: Lajuana Matte, MD;  Location: [place];  Service: Open Heart Surgery;  Laterality: N/A;  . INCISION AND DRAINAGE ABSCESS Right 10/18/2016   Procedure: INCISION AND DRAINAGE ABSCESS RIGHT SHOULDER;  Surgeon: [name], MD;  Location: [place];  Service: Orthopedics;  Laterality: Right;  . INCISION AND DRAINAGE OF WOUND Right 08/2005   shoulder/notes 09/20/2010  . IR FLUORO GUIDE CV LINE LEFT  10/19/2016  . IR US GUIDE VASC ACCESS LEFT  10/19/2016  . IRRIGATION AND DEBRIDEMENT SHOULDER Right 12/22/2016   Procedure: IRRIGATION AND DEBRIDEMENT RIGHT SHOULDER;  Surgeon: [name], MD;  Location: [place];  Service: Orthopedics;  Laterality: Right;  . JOINT REPLACEMENT    . LEFT HEART CATH AND CORONARY ANGIOGRAPHY N/A 08/16/2016   Procedure: Left Heart Cath and Coronary Angiography;  Surgeon: [name], MD;  Location: [place] CV LAB;  Service: Cardiovascular;  Laterality: N/A;  . LEFT HEART CATH AND CORONARY ANGIOGRAPHY N/A 03/26/2019   Procedure: LEFT HEART CATH AND CORONARY ANGIOGRAPHY;  Surgeon: Martinique, Hiawatha Merriott M, MD;  Location: [place] CV LAB;  Service: Cardiovascular;  Laterality: N/A;  . LEFT HEART CATHETERIZATION WITH CORONARY ANGIOGRAM N/A 08/09/2012   Procedure: LEFT HEART CATHETERIZATION WITH CORONARY ANGIOGRAM;  Surgeon: Sontee Desena M Martinique, MD;  Location: [facility] CATH LAB;  Service: Cardiovascular;  Laterality: N/A;  . LUMBAR FUSION    . NEPHRECTOMY Left early 2000s  . REPLACEMENT TOTAL KNEE Right 08/2008   [name] 09/07/2010  . SHOULDER ARTHROSCOPY WITH ROTATOR CUFF REPAIR Right 06/2005   [name] 09/20/2010  . TOTAL KNEE ARTHROPLASTY  06/08/2011   Procedure: TOTAL KNEE ARTHROPLASTY;  Surgeon: [name], MD;  Location: [place];  Service: Orthopedics;  Laterality: Left;  . TOTAL SHOULDER ARTHROPLASTY Right 04/06/2016   Procedure:  RIGHT REVERSE TOTAL SHOULDER ARTHROPLASTY;  Surgeon: [name], MD;  Location: [place];  Service: Orthopedics;  Laterality: Right;  . TOTAL SHOULDER REVISION Right 12/22/2016   Procedure: RIGHT SHOULDER GLENOID AND HUMERAL COMPONENT REVISION;  Surgeon: [name], MD;  Location: [place];  Service: Orthopedics;  Laterality: Right;  . US ECHOCARDIOGRAPHY  12/15/2008   EF 60-65%  . US ECHOCARDIOGRAPHY  09/28/2004   EF 55-60%     Current Outpatient Medications  Medication Sig Dispense Refill  . acyclovir (ZOVIRAX) 400 MG tablet Take 400 mg by mouth 3 (three) times daily as needed (For cold sores). .  5  . allopurinol (ZYLOPRIM) 300 MG tablet Take 300 mg by mouth daily as needed (for gout flares).     Marland Kitchen amiodarone (PACERONE) 400 MG tablet Take 0.5 tablets (200 mg total) by mouth 2 (two) times daily. For one week;then take 200 mg daily thereafter 30 tablet 1  . apixaban (ELIQUIS) 5 MG TABS tablet Take 1 tablet (5 mg total) by mouth 2 (two) times daily. [place]  tablet 1  . aspirin 325 MG tablet Take 1 tablet (325 mg total) by mouth daily.    Marland Kitchen atorvastatin (LIPITOR) 80 MG tablet Take 1 tablet (80 mg total) by mouth every evening. 30 tablet 6  . EPINEPHrine 0.3 mg/0.3 mL IJ SOAJ injection Inject 0.3 mg into the muscle as needed for anaphylaxis.   11  . furosemide (LASIX) 40 MG tablet Take 1 tablet (40 mg total) by mouth daily. For one week;then take 20 mg daily thereafter 30 tablet 1  . LORazepam (ATIVAN) 0.5 MG tablet Take 0.5 mg by mouth 2 (two) times daily as needed for anxiety.   2  . metoprolol tartrate (LOPRESSOR) 25 MG tablet Take 25 mg by mouth 2 (two) times daily.    Marland Kitchen oxyCODONE (OXY IR/ROXICODONE) 5 MG immediate release tablet Take 1 tablet (5 mg total) by mouth every 6 (six) hours as needed for severe pain. 28 tablet 0  . pantoprazole (PROTONIX) 40 MG tablet Take 40 mg by mouth every evening.     . potassium chloride SA (KLOR-CON) 20 MEQ tablet Take 1 tablet (20 mEq total) by mouth daily.  For one week then stop 7 tablet 0   Current Facility-Administered Medications  Medication Dose Route Frequency Provider Last Rate Last Dose  . sodium chloride flush (NS) 0.9 % injection 3 mL  3 mL Intravenous Q12H Martinique, Jadzia Ibsen M, MD        Allergies:   Adhesive [tape] and Latex    Social History:  The patient  reports that he quit smoking about 29 years ago. He has never used smokeless tobacco. He reports current alcohol use. He reports that he does not use drugs.   Family History:  The patient's family history includes Heart attack in his father; Heart disease in his brother.    ROS:  Please see the history of present illness.   Otherwise, review of systems are positive for none.   All other systems are reviewed and negative.    PHYSICAL EXAM: VS:  BP 120/72   Pulse 68   Ht 5\' 7"  (1.702 m)   Wt 225 lb 6.4 oz (102.2 kg)   BMI 35.30 kg/m  , BMI Body mass index is 35.3 kg/m. GEN: Well nourished, well developed, in no acute distress  HEENT: normal  Neck: no JVD, carotid bruits, or masses Cardiac: RRR; no murmurs, rubs, or gallops,no edema. Sternal incision is healing well.  Respiratory:  clear to auscultation bilaterally, normal work of breathing GI: soft, nontender, nondistended, + BS MS: no deformity or atrophy  Skin: warm and dry, no rash Neuro:  Strength and sensation are intact Psych: euthymic mood, full affect   EKG:  EKG is ordered today. The ekg ordered today demonstrates NSR with normal Ecg. Rate 68. I have personally reviewed and interpreted this study.    Recent Labs: 03/29/2019: B Natriuretic Peptide 50.2; TSH 3.340 04/01/2019: ALT 15 04/03/2019: Magnesium 2.2 04/05/2019: BUN 24; Creatinine, Ser 1.24; Hemoglobin 9.1; Platelets 123; Potassium 4.2; Sodium 134    Lipid Panel    Component Value Date/Time   CHOL 98 03/30/2019 0212   TRIG 174 (H) 03/30/2019 0212   HDL 29 (L) 03/30/2019 0212   CHOLHDL 3.4 03/30/2019 0212   VLDL 35 03/30/2019 0212   LDLCALC  34 03/30/2019 0212    Dated 01/25/19: cholesterol 117, triglycerides 125, HDL 39, LDL 53. CMET, CBC, TSH normal  Wt Readings from Last 3 Encounters:  04/15/19 225 lb 6.4 oz (102.2 kg)  04/12/19 224 lb (101.6 kg)  04/08/19 234 lb 8 oz (106.4 kg)      Other studies Reviewed: Additional studies/ records that were reviewed today include:   Cath 03/26/19: Procedures  LEFT HEART CATH AND CORONARY ANGIOGRAPHY  Conclusion    Prox RCA lesion is 45% stenosed.  2nd RPL lesion is 80% stenosed.  Ost LM to Mid LM lesion is 50% stenosed.  Ost Cx to Mid Cx lesion is 80% stenosed.  Ramus lesion is 90% stenosed.  Mid LAD lesion is 70% stenosed.  2nd Diag lesion is 60% stenosed.  The left ventricular systolic function is normal.  LV end diastolic pressure is normal.  The left ventricular ejection fraction is 55-65% by visual estimate.   1. Three vessel obstructive, Calcific CAD 2. Normal LV function 3. Normal LVEDP  Plan: recommend referral to CT surgery for CABG. Will stop Plavix.    Echo 03/30/19: IMPRESSIONS    1. Left ventricular ejection fraction, by visual estimation, is 55 to 60%. The left ventricle has normal function. There is no left ventricular hypertrophy.  2. Global right ventricle has mildly reduced systolic function.The right ventricular size is mildly enlarged. No increase in right ventricular wall thickness.  3. Left atrial size was normal.  4. Right atrial size was mildly dilated.  5. The mitral valve is normal in structure. No evidence of mitral valve regurgitation.  6. The tricuspid valve is normal in structure. Tricuspid valve regurgitation is trivial.  7. The aortic valve is normal in structure. Aortic valve regurgitation is not visualized. No evidence of aortic valve sclerosis or stenosis.  8. The pulmonic valve was grossly normal. Pulmonic valve regurgitation is not visualized.  9. Aortic dilatation noted. 10. There is mild dilatation of the  ascending aorta measuring 38 mm. 11. Normal pulmonary artery systolic pressure. 12. The atrial septum is grossly normal.   ASSESSMENT AND PLAN:  1.CAD recent crescendo angina. Now s/p CABG x 4 on 04/02/19: his recovery is going well. Continue ASA 325 mg for 3 months. Continue high dose statin and metoprolol.   2. Atrial fibrillation post op CABG. On amiodarone 200 mg daily. Maintaining NSR. Will continue amiodarone and Eliquis. Follow up in 6 weeks. If he is still in NSR then will stop Eliquis and amiodarone.   3. HTN: Blood pressure is under excellent control now. Was on multiple meds prior to surgery. Will need to watch closely.    4. HLD: Continue on Lipitor 80 mg daily.Excellent control.     5. History of CVA on ASA and Plavix pre op. Now on ASA and Eliquis    6. Cervical/lumbar spine disease. Followed by Dr. Ellene Route.     7. Right shoulder prosthetic infection/abscess. S/p surgical revision.Infection resolved      8. Chronic diastolic CHF. Well compensated.    Current medicines are reviewed at length with the patient today.  The patient does not have concerns regarding medicines.  The following changes have been made:  See above  Labs/ tests ordered today include:   No orders of the defined types were placed in this encounter.    Disposition:   FU 6 weeks.  Signed, Omah Dewalt Martinique, MD  04/15/2019 4:58 PM    [facility] [address] [place], [place], [address] Phone [phone], Fax [phone]

## 2019-04-15 ENCOUNTER — Other Ambulatory Visit: Payer: Self-pay

## 2019-04-15 ENCOUNTER — Ambulatory Visit: Payer: Medicare HMO | Admitting: Cardiology

## 2019-04-15 ENCOUNTER — Encounter: Payer: Self-pay | Admitting: Cardiology

## 2019-04-15 VITALS — BP 120/72 | HR 68 | Ht 67.0 in | Wt 225.4 lb

## 2019-04-15 DIAGNOSIS — E7800 Pure hypercholesterolemia, unspecified: Secondary | ICD-10-CM | POA: Diagnosis not present

## 2019-04-15 DIAGNOSIS — R69 Illness, unspecified: Secondary | ICD-10-CM | POA: Diagnosis not present

## 2019-04-15 DIAGNOSIS — Z91030 Bee allergy status: Secondary | ICD-10-CM | POA: Diagnosis not present

## 2019-04-15 DIAGNOSIS — I2510 Atherosclerotic heart disease of native coronary artery without angina pectoris: Secondary | ICD-10-CM | POA: Diagnosis not present

## 2019-04-15 DIAGNOSIS — I25118 Atherosclerotic heart disease of native coronary artery with other forms of angina pectoris: Secondary | ICD-10-CM

## 2019-04-15 DIAGNOSIS — I10 Essential (primary) hypertension: Secondary | ICD-10-CM | POA: Diagnosis not present

## 2019-04-15 DIAGNOSIS — I480 Paroxysmal atrial fibrillation: Secondary | ICD-10-CM

## 2019-04-15 DIAGNOSIS — Z951 Presence of aortocoronary bypass graft: Secondary | ICD-10-CM | POA: Diagnosis not present

## 2019-04-15 NOTE — Telephone Encounter (Signed)
Patient has MD OV 04/15/2019

## 2019-04-16 DIAGNOSIS — I951 Orthostatic hypotension: Secondary | ICD-10-CM | POA: Diagnosis not present

## 2019-04-16 DIAGNOSIS — R69 Illness, unspecified: Secondary | ICD-10-CM | POA: Diagnosis not present

## 2019-04-16 DIAGNOSIS — I4891 Unspecified atrial fibrillation: Secondary | ICD-10-CM | POA: Diagnosis not present

## 2019-04-16 DIAGNOSIS — I10 Essential (primary) hypertension: Secondary | ICD-10-CM | POA: Diagnosis not present

## 2019-04-16 DIAGNOSIS — Z48812 Encounter for surgical aftercare following surgery on the circulatory system: Secondary | ICD-10-CM | POA: Diagnosis not present

## 2019-04-16 DIAGNOSIS — M48061 Spinal stenosis, lumbar region without neurogenic claudication: Secondary | ICD-10-CM | POA: Diagnosis not present

## 2019-04-16 DIAGNOSIS — M109 Gout, unspecified: Secondary | ICD-10-CM | POA: Diagnosis not present

## 2019-04-16 DIAGNOSIS — I25110 Atherosclerotic heart disease of native coronary artery with unstable angina pectoris: Secondary | ICD-10-CM | POA: Diagnosis not present

## 2019-04-16 DIAGNOSIS — G8929 Other chronic pain: Secondary | ICD-10-CM | POA: Diagnosis not present

## 2019-04-16 DIAGNOSIS — K219 Gastro-esophageal reflux disease without esophagitis: Secondary | ICD-10-CM | POA: Diagnosis not present

## 2019-04-22 DIAGNOSIS — M109 Gout, unspecified: Secondary | ICD-10-CM | POA: Diagnosis not present

## 2019-04-22 DIAGNOSIS — I25110 Atherosclerotic heart disease of native coronary artery with unstable angina pectoris: Secondary | ICD-10-CM | POA: Diagnosis not present

## 2019-04-22 DIAGNOSIS — R69 Illness, unspecified: Secondary | ICD-10-CM | POA: Diagnosis not present

## 2019-04-22 DIAGNOSIS — M48061 Spinal stenosis, lumbar region without neurogenic claudication: Secondary | ICD-10-CM | POA: Diagnosis not present

## 2019-04-22 DIAGNOSIS — Z48812 Encounter for surgical aftercare following surgery on the circulatory system: Secondary | ICD-10-CM | POA: Diagnosis not present

## 2019-04-22 DIAGNOSIS — I4891 Unspecified atrial fibrillation: Secondary | ICD-10-CM | POA: Diagnosis not present

## 2019-04-22 DIAGNOSIS — I10 Essential (primary) hypertension: Secondary | ICD-10-CM | POA: Diagnosis not present

## 2019-04-22 DIAGNOSIS — I951 Orthostatic hypotension: Secondary | ICD-10-CM | POA: Diagnosis not present

## 2019-04-22 DIAGNOSIS — K219 Gastro-esophageal reflux disease without esophagitis: Secondary | ICD-10-CM | POA: Diagnosis not present

## 2019-04-22 DIAGNOSIS — G8929 Other chronic pain: Secondary | ICD-10-CM | POA: Diagnosis not present

## 2019-04-24 ENCOUNTER — Telehealth: Payer: Self-pay | Admitting: Cardiology

## 2019-04-24 NOTE — Telephone Encounter (Signed)
New Message    Pt c/o medication issue:  1. Name of Medication: metoprolol tartrate (LOPRESSOR) 25 MG tablet  2. How are you currently taking this medication (dosage and times per day)? 25 mg 2 x daily   3. Are you having a reaction (difficulty breathing--STAT)? No   4. What is your medication issue? Pts wife is calling and says they got his refill from the pharmacy and it is written as 50 mg 2 x daily and the pts wife is wanting to clarify this     Please call

## 2019-04-24 NOTE — Telephone Encounter (Signed)
Clarification  Pt taking Metoprolol 25 mg twice daily Pharmacy filled with 50 mg Instructed pt's wife pt could take 1/2 tab twice a day .Adonis Housekeeper

## 2019-04-25 DIAGNOSIS — R69 Illness, unspecified: Secondary | ICD-10-CM | POA: Diagnosis not present

## 2019-04-25 DIAGNOSIS — M48061 Spinal stenosis, lumbar region without neurogenic claudication: Secondary | ICD-10-CM | POA: Diagnosis not present

## 2019-04-25 DIAGNOSIS — I951 Orthostatic hypotension: Secondary | ICD-10-CM | POA: Diagnosis not present

## 2019-04-25 DIAGNOSIS — I10 Essential (primary) hypertension: Secondary | ICD-10-CM | POA: Diagnosis not present

## 2019-04-25 DIAGNOSIS — I25110 Atherosclerotic heart disease of native coronary artery with unstable angina pectoris: Secondary | ICD-10-CM | POA: Diagnosis not present

## 2019-04-25 DIAGNOSIS — G8929 Other chronic pain: Secondary | ICD-10-CM | POA: Diagnosis not present

## 2019-04-25 DIAGNOSIS — Z48812 Encounter for surgical aftercare following surgery on the circulatory system: Secondary | ICD-10-CM | POA: Diagnosis not present

## 2019-04-25 DIAGNOSIS — I4891 Unspecified atrial fibrillation: Secondary | ICD-10-CM | POA: Diagnosis not present

## 2019-04-25 DIAGNOSIS — M109 Gout, unspecified: Secondary | ICD-10-CM | POA: Diagnosis not present

## 2019-04-25 DIAGNOSIS — K219 Gastro-esophageal reflux disease without esophagitis: Secondary | ICD-10-CM | POA: Diagnosis not present

## 2019-04-28 ENCOUNTER — Other Ambulatory Visit: Payer: Self-pay | Admitting: Physician Assistant

## 2019-04-29 DIAGNOSIS — K219 Gastro-esophageal reflux disease without esophagitis: Secondary | ICD-10-CM | POA: Diagnosis not present

## 2019-04-29 DIAGNOSIS — I25110 Atherosclerotic heart disease of native coronary artery with unstable angina pectoris: Secondary | ICD-10-CM | POA: Diagnosis not present

## 2019-04-29 DIAGNOSIS — R69 Illness, unspecified: Secondary | ICD-10-CM | POA: Diagnosis not present

## 2019-04-29 DIAGNOSIS — I951 Orthostatic hypotension: Secondary | ICD-10-CM | POA: Diagnosis not present

## 2019-04-29 DIAGNOSIS — I4891 Unspecified atrial fibrillation: Secondary | ICD-10-CM | POA: Diagnosis not present

## 2019-04-29 DIAGNOSIS — M109 Gout, unspecified: Secondary | ICD-10-CM | POA: Diagnosis not present

## 2019-04-29 DIAGNOSIS — Z48812 Encounter for surgical aftercare following surgery on the circulatory system: Secondary | ICD-10-CM | POA: Diagnosis not present

## 2019-04-29 DIAGNOSIS — I10 Essential (primary) hypertension: Secondary | ICD-10-CM | POA: Diagnosis not present

## 2019-04-29 DIAGNOSIS — G8929 Other chronic pain: Secondary | ICD-10-CM | POA: Diagnosis not present

## 2019-04-29 DIAGNOSIS — M48061 Spinal stenosis, lumbar region without neurogenic claudication: Secondary | ICD-10-CM | POA: Diagnosis not present

## 2019-04-30 ENCOUNTER — Telehealth (HOSPITAL_COMMUNITY): Payer: Self-pay

## 2019-04-30 NOTE — Telephone Encounter (Signed)
Called to advise pt that given the surge/increase in Covid-19 cases and hospitalizations our our [facility] has asked [place] to halt onsite Cardiac and Pulmonary rehab exercise sessions and move patients to the Virtual app we have with [facility]. The reason for this is so that department staff can be deployed to the Inpatient Nursing floors to assist those teams in need.  Leadership anticipates this need will be 30-60 days however it could be extended if needed.. Advised pt to attend Orientation Assessment appt. At that time He will receive additional information about our [employer] Pt stated that he didn't want to do virtual and will wait til we open back up to attend. Will contact pt then.

## 2019-05-02 DIAGNOSIS — R69 Illness, unspecified: Secondary | ICD-10-CM | POA: Diagnosis not present

## 2019-05-02 DIAGNOSIS — M48061 Spinal stenosis, lumbar region without neurogenic claudication: Secondary | ICD-10-CM | POA: Diagnosis not present

## 2019-05-02 DIAGNOSIS — K219 Gastro-esophageal reflux disease without esophagitis: Secondary | ICD-10-CM | POA: Diagnosis not present

## 2019-05-02 DIAGNOSIS — I25110 Atherosclerotic heart disease of native coronary artery with unstable angina pectoris: Secondary | ICD-10-CM | POA: Diagnosis not present

## 2019-05-02 DIAGNOSIS — Z48812 Encounter for surgical aftercare following surgery on the circulatory system: Secondary | ICD-10-CM | POA: Diagnosis not present

## 2019-05-02 DIAGNOSIS — M109 Gout, unspecified: Secondary | ICD-10-CM | POA: Diagnosis not present

## 2019-05-02 DIAGNOSIS — I10 Essential (primary) hypertension: Secondary | ICD-10-CM | POA: Diagnosis not present

## 2019-05-02 DIAGNOSIS — I951 Orthostatic hypotension: Secondary | ICD-10-CM | POA: Diagnosis not present

## 2019-05-02 DIAGNOSIS — G8929 Other chronic pain: Secondary | ICD-10-CM | POA: Diagnosis not present

## 2019-05-02 DIAGNOSIS — I4891 Unspecified atrial fibrillation: Secondary | ICD-10-CM | POA: Diagnosis not present

## 2019-05-06 ENCOUNTER — Other Ambulatory Visit: Payer: Self-pay | Admitting: Cardiology

## 2019-05-06 DIAGNOSIS — I25110 Atherosclerotic heart disease of native coronary artery with unstable angina pectoris: Secondary | ICD-10-CM | POA: Diagnosis not present

## 2019-05-06 DIAGNOSIS — M109 Gout, unspecified: Secondary | ICD-10-CM | POA: Diagnosis not present

## 2019-05-06 DIAGNOSIS — M48061 Spinal stenosis, lumbar region without neurogenic claudication: Secondary | ICD-10-CM | POA: Diagnosis not present

## 2019-05-06 DIAGNOSIS — R69 Illness, unspecified: Secondary | ICD-10-CM | POA: Diagnosis not present

## 2019-05-06 DIAGNOSIS — G8929 Other chronic pain: Secondary | ICD-10-CM | POA: Diagnosis not present

## 2019-05-06 DIAGNOSIS — Z48812 Encounter for surgical aftercare following surgery on the circulatory system: Secondary | ICD-10-CM | POA: Diagnosis not present

## 2019-05-06 DIAGNOSIS — K219 Gastro-esophageal reflux disease without esophagitis: Secondary | ICD-10-CM | POA: Diagnosis not present

## 2019-05-06 DIAGNOSIS — I10 Essential (primary) hypertension: Secondary | ICD-10-CM | POA: Diagnosis not present

## 2019-05-06 DIAGNOSIS — I951 Orthostatic hypotension: Secondary | ICD-10-CM | POA: Diagnosis not present

## 2019-05-06 DIAGNOSIS — I4891 Unspecified atrial fibrillation: Secondary | ICD-10-CM | POA: Diagnosis not present

## 2019-05-06 MED ORDER — AMIODARONE HCL 400 MG PO TABS: 200.0000 mg | ORAL_TABLET | Freq: Every day | ORAL | 1 refills | Status: DC

## 2019-05-06 NOTE — Telephone Encounter (Signed)
*  STAT* If patient is at the pharmacy, call can be transferred to refill team.   1. Which medications need to be refilled? (please list name of each medication and dose if known) amiodarone (PACERONE) 400 MG tablet  2. Which pharmacy/location (including street and city if local pharmacy) is medication to be sent to? [facility] - [place], [place] - [place] [address]  3. Do they need a 30 day or 90 day supply? 71  Patient's wife states that she has contacted Dr. Abran Duke office in regards to this medication and they advised her that Dr. Martinique will be taking over the medication.

## 2019-05-08 ENCOUNTER — Other Ambulatory Visit: Payer: Self-pay | Admitting: Thoracic Surgery (Cardiothoracic Vascular Surgery)

## 2019-05-08 DIAGNOSIS — Z951 Presence of aortocoronary bypass graft: Secondary | ICD-10-CM

## 2019-05-09 DIAGNOSIS — M109 Gout, unspecified: Secondary | ICD-10-CM | POA: Diagnosis not present

## 2019-05-09 DIAGNOSIS — I951 Orthostatic hypotension: Secondary | ICD-10-CM | POA: Diagnosis not present

## 2019-05-09 DIAGNOSIS — M48061 Spinal stenosis, lumbar region without neurogenic claudication: Secondary | ICD-10-CM | POA: Diagnosis not present

## 2019-05-09 DIAGNOSIS — Z48812 Encounter for surgical aftercare following surgery on the circulatory system: Secondary | ICD-10-CM | POA: Diagnosis not present

## 2019-05-09 DIAGNOSIS — K219 Gastro-esophageal reflux disease without esophagitis: Secondary | ICD-10-CM | POA: Diagnosis not present

## 2019-05-09 DIAGNOSIS — I25110 Atherosclerotic heart disease of native coronary artery with unstable angina pectoris: Secondary | ICD-10-CM | POA: Diagnosis not present

## 2019-05-09 DIAGNOSIS — R69 Illness, unspecified: Secondary | ICD-10-CM | POA: Diagnosis not present

## 2019-05-09 DIAGNOSIS — I4891 Unspecified atrial fibrillation: Secondary | ICD-10-CM | POA: Diagnosis not present

## 2019-05-09 DIAGNOSIS — G8929 Other chronic pain: Secondary | ICD-10-CM | POA: Diagnosis not present

## 2019-05-09 DIAGNOSIS — I10 Essential (primary) hypertension: Secondary | ICD-10-CM | POA: Diagnosis not present

## 2019-05-13 DIAGNOSIS — I25110 Atherosclerotic heart disease of native coronary artery with unstable angina pectoris: Secondary | ICD-10-CM | POA: Diagnosis not present

## 2019-05-13 DIAGNOSIS — K219 Gastro-esophageal reflux disease without esophagitis: Secondary | ICD-10-CM | POA: Diagnosis not present

## 2019-05-13 DIAGNOSIS — R69 Illness, unspecified: Secondary | ICD-10-CM | POA: Diagnosis not present

## 2019-05-13 DIAGNOSIS — Z48812 Encounter for surgical aftercare following surgery on the circulatory system: Secondary | ICD-10-CM | POA: Diagnosis not present

## 2019-05-13 DIAGNOSIS — I4891 Unspecified atrial fibrillation: Secondary | ICD-10-CM | POA: Diagnosis not present

## 2019-05-13 DIAGNOSIS — I10 Essential (primary) hypertension: Secondary | ICD-10-CM | POA: Diagnosis not present

## 2019-05-13 DIAGNOSIS — I951 Orthostatic hypotension: Secondary | ICD-10-CM | POA: Diagnosis not present

## 2019-05-13 DIAGNOSIS — G8929 Other chronic pain: Secondary | ICD-10-CM | POA: Diagnosis not present

## 2019-05-13 DIAGNOSIS — M109 Gout, unspecified: Secondary | ICD-10-CM | POA: Diagnosis not present

## 2019-05-13 DIAGNOSIS — M48061 Spinal stenosis, lumbar region without neurogenic claudication: Secondary | ICD-10-CM | POA: Diagnosis not present

## 2019-05-14 ENCOUNTER — Other Ambulatory Visit: Payer: Self-pay

## 2019-05-14 ENCOUNTER — Ambulatory Visit
Admission: RE | Admit: 2019-05-14 | Discharge: 2019-05-14 | Disposition: A | Discharge status: Home / Self Care | Payer: Medicare HMO | Source: Ambulatory Visit | Attending: Thoracic Surgery (Cardiothoracic Vascular Surgery) | Admitting: Thoracic Surgery (Cardiothoracic Vascular Surgery)

## 2019-05-14 ENCOUNTER — Encounter: Payer: Self-pay | Admitting: Thoracic Surgery (Cardiothoracic Vascular Surgery)

## 2019-05-14 ENCOUNTER — Other Ambulatory Visit: Payer: Self-pay | Admitting: Physician Assistant

## 2019-05-14 ENCOUNTER — Ambulatory Visit (INDEPENDENT_AMBULATORY_CARE_PROVIDER_SITE_OTHER): Payer: Self-pay | Admitting: Thoracic Surgery (Cardiothoracic Vascular Surgery)

## 2019-05-14 VITALS — BP 134/74 | HR 80 | Temp 97.8°F | Resp 20 | Ht 67.0 in | Wt 225.0 lb

## 2019-05-14 DIAGNOSIS — Z951 Presence of aortocoronary bypass graft: Secondary | ICD-10-CM | POA: Diagnosis not present

## 2019-05-14 DIAGNOSIS — I2510 Atherosclerotic heart disease of native coronary artery without angina pectoris: Secondary | ICD-10-CM

## 2019-05-14 DIAGNOSIS — I25810 Atherosclerosis of coronary artery bypass graft(s) without angina pectoris: Secondary | ICD-10-CM | POA: Diagnosis not present

## 2019-05-14 MED ORDER — TRAMADOL HCL 50 MG PO TABS: 50.0000 mg | ORAL_TABLET | Freq: Four times a day (QID) | ORAL | 0 refills | Status: DC | PRN

## 2019-05-14 NOTE — Addendum Note (Signed)
Addended by: Lajuana Matte on: 05/14/2019 02:57 PM   Modules accepted: Orders

## 2019-05-14 NOTE — Progress Notes (Signed)
      [address]       [place] [address]             [phone]        Deo W Minkler  Medical Record [id number] Date of Birth: Aug 09, 1940  Referring: Martinique, Peter M, MD Primary Care: Harlan Stains, MD Primary Cardiologist:Peter Martinique, MD  Reason for visit:   follow-up  History of Present Illness:     Doing well.  Ambulating.  Continues to have some chest wall pain.  Tylenol does not help  Physical Exam: BP 134/74   Pulse 80   Temp 97.8 F (36.6 C) (Skin)   Resp 20   Ht 5\' 7"  (1.702 m)   Wt 225 lb (102.1 kg)   SpO2 95% Comment: RA  BMI 35.24 kg/m   Alert NAD Incision clean.  Sternum stable Abdomen soft, ND no peripheral edema   Diagnostic Studies & Laboratory data: CXR: clear     Assessment / Plan:   79 yo male s/p CABG Clear for cardiac rehab Follow-up prn   Lajuana Matte 05/14/2019 10:22 AM

## 2019-05-15 DIAGNOSIS — R69 Illness, unspecified: Secondary | ICD-10-CM | POA: Diagnosis not present

## 2019-05-15 DIAGNOSIS — Z48812 Encounter for surgical aftercare following surgery on the circulatory system: Secondary | ICD-10-CM | POA: Diagnosis not present

## 2019-05-15 DIAGNOSIS — I4891 Unspecified atrial fibrillation: Secondary | ICD-10-CM | POA: Diagnosis not present

## 2019-05-15 DIAGNOSIS — I951 Orthostatic hypotension: Secondary | ICD-10-CM | POA: Diagnosis not present

## 2019-05-15 DIAGNOSIS — I25110 Atherosclerotic heart disease of native coronary artery with unstable angina pectoris: Secondary | ICD-10-CM | POA: Diagnosis not present

## 2019-05-15 DIAGNOSIS — M48061 Spinal stenosis, lumbar region without neurogenic claudication: Secondary | ICD-10-CM | POA: Diagnosis not present

## 2019-05-15 DIAGNOSIS — K219 Gastro-esophageal reflux disease without esophagitis: Secondary | ICD-10-CM | POA: Diagnosis not present

## 2019-05-15 DIAGNOSIS — G8929 Other chronic pain: Secondary | ICD-10-CM | POA: Diagnosis not present

## 2019-05-15 DIAGNOSIS — I10 Essential (primary) hypertension: Secondary | ICD-10-CM | POA: Diagnosis not present

## 2019-05-15 DIAGNOSIS — M109 Gout, unspecified: Secondary | ICD-10-CM | POA: Diagnosis not present

## 2019-05-16 ENCOUNTER — Ambulatory Visit (HOSPITAL_COMMUNITY): Payer: Medicare HMO

## 2019-05-17 ENCOUNTER — Ambulatory Visit: Payer: Medicare HMO | Admitting: Thoracic Surgery (Cardiothoracic Vascular Surgery)

## 2019-05-20 ENCOUNTER — Other Ambulatory Visit: Payer: Self-pay | Admitting: Cardiology

## 2019-05-20 ENCOUNTER — Ambulatory Visit (HOSPITAL_COMMUNITY): Payer: Medicare HMO

## 2019-05-21 NOTE — Progress Notes (Deleted)
Cardiology Office Note   Date:  05/21/2019   ID:  Jake Dunn 06-12-1940, MRN [id number]  PCP:  Harlan Stains, MD  Cardiologist:   Jearld Hemp Martinique, MD   No chief complaint on file.     History of Present Illness: Jake Dunn is a 79 y.o. male who is seen for follow up s/p CABG. He has a PMH of CAD  (70% 25rd diag '14), GERD, HTN, HL, renal cell CA s/p left nephrectomy. Last cardiac catheterization in 2014 showed nonobstructive CAD with 70% lesion in third diagonal that was treated medically. He is fairly active and recently undergoing physical therapy after his right shoulder surgery 5 months ago. He presented with left sided chest pressure on 08/15/2016 while at Urology office. BP was quite high that day. He underwent cardiac catheterization on 08/16/2016, this revealed two-vessel CAD with moderate calcified stenosis in the mid LAD, second diagonal, ramus intermedius, ostial and proximal left circumflex and a severe stenosis of the second PLA branch of the RCA. Overall normal LVEDP and normal LVEF. Recommended medical therapy and outpatient exercise stress Myoview. While the LAD and the diagonal only showed moderate progression compared to the 2010/2014 studies, the left circumflex and intermediate stenosis had progressed. Echocardiogram obtained on 08/17/2016 showed EF 0000000, grade 2 diastolic dysfunction, severely dilated left atrium, PA peak pressure 30 mmHg. Lexiscan Myoview obtained on 08/23/2016 showed EF 51%, no perfusion defect at stress, overall low risk study.   He was seen in November with worsening symptoms of chest pain and dyspnea. This led to a cardiac cath showing 3 vessel obstructive CAD. He underwent CABG on 04/02/19 by Dr Kipp Brood. CABG X 4.  LIMA LAD, RSVG PLV, Ramus, and D2  (T graft off the ramus vein graft). Post op course complicated by Afib. He was loaded with amiodarone.   Since discharge he has done well. No pain. Still has a productive cough but this is  improved. No palpitations or dizziness. BP has been very good.     Past Medical History:  Diagnosis Date  . Adenomatous polyp   . Anxiety   . Arthritis    "knees, ankles, shoulders" (08/15/2016)  . CAD (coronary artery disease)    s/p cath in 2014 showing significant stenosis in the diagonal side branches and in the RV marginal branch. These vessels were small and diffusely diseased. He is managed medically. 08/16/16 Cath, no disease progression  . Chronic lower back pain   . Disc disease, degenerative, cervical   . GERD (gastroesophageal reflux disease)   . Gout   . Hyperlipidemia   . Hypertension   . Osteoarthritis   . Renal cell carcinoma    left kidney removal  . Stroke [facility])    MINI STROKE 15+ YRS AGO, no residual  . Stroke ([place])    "I've had 2 or 3"; denies residual on 08/15/2016  . Syncope and collapse     Past Surgical History:  Procedure Laterality Date  . ANTERIOR CERVICAL DECOMP/DISCECTOMY FUSION  02/2004; 04/2005   [name] 5/14/2012Marland Kitchen [name] 09/20/2010  . APPLICATION OF ROBOTIC ASSISTANCE FOR SPINAL PROCEDURE N/A 01/02/2018   Procedure: APPLICATION OF ROBOTIC ASSISTANCE FOR SPINAL PROCEDURE;  Surgeon: Kristeen Miss, MD;  Location: [place];  Service: Neurosurgery;  Laterality: N/A;  . BACK SURGERY    . CARDIAC CATHETERIZATION  12/14/2008   ef 50-55%. SHOWED SIGNIFICANT STENOSIS AND TO DIAGONAL SIDE BRANCHES AND IN THE RIGHT VENTRICULAR MARGINAL BRANCH. THESE VESSELS WERE SMALL AND DIFFUSELY DISEASED  .  CARDIOVASCULAR STRESS TEST  12/10/2009   EF 61%. EKG negative. Mild peri ischemia. Managed medically.   . CARPAL TUNNEL RELEASE Right 11/2005   [name] 09/20/2010  . CARPAL TUNNEL RELEASE Left 06/2007   [name] 5/132012  . COLONOSCOPY  11/2004   [name] 09/20/2010  . COLONOSCOPY W/ BIOPSIES AND POLYPECTOMY  09/2002   [name] 09/20/2010  . CORONARY ARTERY BYPASS GRAFT N/A 04/02/2019   Procedure: CORONARY ARTERY BYPASS GRAFTING (CABG) using LIMA to LAD; Endoscopically harvested right  greater saphenous vein to the PLB, Ramus, and Diag 2.;  Surgeon: Lajuana Matte, MD;  Location: [place];  Service: Open Heart Surgery;  Laterality: N/A;  . INCISION AND DRAINAGE ABSCESS Right 10/18/2016   Procedure: INCISION AND DRAINAGE ABSCESS RIGHT SHOULDER;  Surgeon: [name], MD;  Location: [place];  Service: Orthopedics;  Laterality: Right;  . INCISION AND DRAINAGE OF WOUND Right 08/2005   shoulder/notes 09/20/2010  . IR FLUORO GUIDE CV LINE LEFT  10/19/2016  . IR US GUIDE VASC ACCESS LEFT  10/19/2016  . IRRIGATION AND DEBRIDEMENT SHOULDER Right 12/22/2016   Procedure: IRRIGATION AND DEBRIDEMENT RIGHT SHOULDER;  Surgeon: [name], MD;  Location: [place];  Service: Orthopedics;  Laterality: Right;  . JOINT REPLACEMENT    . LEFT HEART CATH AND CORONARY ANGIOGRAPHY N/A 08/16/2016   Procedure: Left Heart Cath and Coronary Angiography;  Surgeon: [name], MD;  Location: [place] CV LAB;  Service: Cardiovascular;  Laterality: N/A;  . LEFT HEART CATH AND CORONARY ANGIOGRAPHY N/A 03/26/2019   Procedure: LEFT HEART CATH AND CORONARY ANGIOGRAPHY;  Surgeon: Martinique, Keaghan Bowens M, MD;  Location: [place] [facility];  Service: Cardiovascular;  Laterality: N/A;  . LEFT HEART CATHETERIZATION WITH CORONARY ANGIOGRAM N/A 08/09/2012   Procedure: LEFT HEART CATHETERIZATION WITH CORONARY ANGIOGRAM;  Surgeon: Anette Barra M Martinique, MD;  Location: [facility] CATH LAB;  Service: Cardiovascular;  Laterality: N/A;  . LUMBAR FUSION    . NEPHRECTOMY Left early 2000s  . REPLACEMENT TOTAL KNEE Right 08/2008   [name] 09/07/2010  . SHOULDER ARTHROSCOPY WITH ROTATOR CUFF REPAIR Right 06/2005   [name] 09/20/2010  . TOTAL KNEE ARTHROPLASTY  06/08/2011   Procedure: TOTAL KNEE ARTHROPLASTY;  Surgeon: [name], MD;  Location: [place];  Service: Orthopedics;  Laterality: Left;  . TOTAL SHOULDER ARTHROPLASTY Right 04/06/2016   Procedure: RIGHT REVERSE TOTAL SHOULDER ARTHROPLASTY;  Surgeon: [name],  MD;  Location: [place];  Service: Orthopedics;  Laterality: Right;  . TOTAL SHOULDER REVISION Right 12/22/2016   Procedure: RIGHT SHOULDER GLENOID AND HUMERAL COMPONENT REVISION;  Surgeon: [name], MD;  Location: [place];  Service: Orthopedics;  Laterality: Right;  . US ECHOCARDIOGRAPHY  12/15/2008   EF 60-65%  . US ECHOCARDIOGRAPHY  09/28/2004   EF 55-60%     Current Outpatient Medications  Medication Sig Dispense Refill  . acyclovir (ZOVIRAX) 400 MG tablet Take 400 mg by mouth 3 (three) times daily as needed (For cold sores). .  5  . allopurinol (ZYLOPRIM) 300 MG tablet Take 300 mg by mouth daily as needed (for gout flares).     Marland Kitchen amiodarone (PACERONE) 400 MG tablet Take 0.5 tablets (200 mg total) by mouth daily. 45 tablet 1  . apixaban (ELIQUIS) 5 MG TABS tablet Take 1 tablet (5 mg total) by mouth 2 (two) times daily. 60 tablet 1  . aspirin 325 MG tablet Take 1 tablet (325 mg total) by mouth daily.    Marland Kitchen atorvastatin (LIPITOR) 80  MG tablet Take 1 tablet (80 mg total) by mouth every evening. 30 tablet 6  . EPINEPHrine 0.3 mg/0.3 mL IJ SOAJ injection Inject 0.3 mg into the muscle as needed for anaphylaxis.   11  . furosemide (LASIX) 40 MG tablet Take 1 tablet (40 mg total) by mouth daily. For one week;then take 20 mg daily thereafter 30 tablet 1  . LORazepam (ATIVAN) 0.5 MG tablet Take 0.5 mg by mouth 2 (two) times daily as needed for anxiety.   2  . metoprolol tartrate (LOPRESSOR) 25 MG tablet Take 25 mg by mouth 2 (two) times daily.    Marland Kitchen oxyCODONE (OXY IR/ROXICODONE) 5 MG immediate release tablet Take 1 tablet (5 mg total) by mouth every 6 (six) hours as needed for severe pain. (Patient not taking: Reported on 05/14/2019) 28 tablet 0  . pantoprazole (PROTONIX) 40 MG tablet Take 40 mg by mouth every evening.     . potassium chloride SA (KLOR-CON) 20 MEQ tablet Take 1 tablet (20 mEq total) by mouth daily. For one week then stop 7 tablet 0  . traMADol (ULTRAM) 50 MG tablet Take 1 tablet (50 mg  total) by mouth every 6 (six) hours as needed. 40 tablet 0   Current Facility-Administered Medications  Medication Dose Route Frequency Provider Last Rate Last Admin  . sodium chloride flush (NS) 0.9 % injection 3 mL  3 mL Intravenous Q12H Martinique, Broughton Eppinger M, MD        Allergies:   Adhesive [tape] and Latex    Social History:  The patient  reports that he quit smoking about 29 years ago. He has never used smokeless tobacco. He reports current alcohol use. He reports that he does not use drugs.   Family History:  The patient's family history includes Heart attack in his father; Heart disease in his brother.    ROS:  Please see the history of present illness.   Otherwise, review of systems are positive for none.   All other systems are reviewed and negative.    PHYSICAL EXAM: VS:  There were no vitals taken for this visit. , BMI There is no height or weight on file to calculate BMI. GEN: Well nourished, well developed, in no acute distress  HEENT: normal  Neck: no JVD, carotid bruits, or masses Cardiac: RRR; no murmurs, rubs, or gallops,no edema. Sternal incision is healing well.  Respiratory:  clear to auscultation bilaterally, normal work of breathing GI: soft, nontender, nondistended, + BS MS: no deformity or atrophy  Skin: warm and dry, no rash Neuro:  Strength and sensation are intact Psych: euthymic mood, full affect   EKG:  EKG is ordered today. The ekg ordered today demonstrates NSR with normal Ecg. Rate 68. I have personally reviewed and interpreted this study.    Recent Labs: 03/29/2019: B Natriuretic Peptide 50.2; TSH 3.340 04/01/2019: ALT 15 04/03/2019: Magnesium 2.2 04/05/2019: BUN 24; Creatinine, Ser 1.24; Hemoglobin 9.1; Platelets 123; Potassium 4.2; Sodium 134    Lipid Panel    Component Value Date/Time   CHOL 98 03/30/2019 0212   TRIG 174 (H) 03/30/2019 0212   HDL 29 (L) 03/30/2019 0212   CHOLHDL 3.4 03/30/2019 0212   VLDL 35 03/30/2019 0212   LDLCALC  34 03/30/2019 0212    Dated 01/25/19: cholesterol 117, triglycerides 125, HDL 39, LDL 53. CMET, CBC, TSH normal  Wt Readings from Last 3 Encounters:  05/14/19 225 lb (102.1 kg)  04/15/19 225 lb 6.4 oz (102.2 kg)  04/12/19 224 lb (  101.6 kg)      Other studies Reviewed: Additional studies/ records that were reviewed today include:   Cath 03/26/19: Procedures  LEFT HEART CATH AND CORONARY ANGIOGRAPHY  Conclusion    Prox RCA lesion is 45% stenosed.  2nd RPL lesion is 80% stenosed.  Ost LM to Mid LM lesion is 50% stenosed.  Ost Cx to Mid Cx lesion is 80% stenosed.  Ramus lesion is 90% stenosed.  Mid LAD lesion is 70% stenosed.  2nd Diag lesion is 60% stenosed.  The left ventricular systolic function is normal.  LV end diastolic pressure is normal.  The left ventricular ejection fraction is 55-65% by visual estimate.   1. Three vessel obstructive, Calcific CAD 2. Normal LV function 3. Normal LVEDP  Plan: recommend referral to CT surgery for CABG. Will stop Plavix.    Echo 03/30/19: IMPRESSIONS    1. Left ventricular ejection fraction, by visual estimation, is 55 to 60%. The left ventricle has normal function. There is no left ventricular hypertrophy.  2. Global right ventricle has mildly reduced systolic function.The right ventricular size is mildly enlarged. No increase in right ventricular wall thickness.  3. Left atrial size was normal.  4. Right atrial size was mildly dilated.  5. The mitral valve is normal in structure. No evidence of mitral valve regurgitation.  6. The tricuspid valve is normal in structure. Tricuspid valve regurgitation is trivial.  7. The aortic valve is normal in structure. Aortic valve regurgitation is not visualized. No evidence of aortic valve sclerosis or stenosis.  8. The pulmonic valve was grossly normal. Pulmonic valve regurgitation is not visualized.  9. Aortic dilatation noted. 10. There is mild dilatation of the ascending  aorta measuring 38 mm. 11. Normal pulmonary artery systolic pressure. 12. The atrial septum is grossly normal.   ASSESSMENT AND PLAN:  1. CAD  Now s/p CABG x 4 on 04/02/19: his recovery is going well. Continue ASA 325 mg for 3 months. Continue high dose statin and metoprolol.   2. Atrial fibrillation post op CABG. On amiodarone 200 mg daily. Maintaining NSR. Will continue amiodarone and Eliquis. Follow up in 6 weeks. If he is still in NSR then will stop Eliquis and amiodarone.   3. HTN: Blood pressure is under excellent control now. Was on multiple meds prior to surgery. Will need to watch closely.    4. HLD: Continue on Lipitor 80 mg daily.Excellent control.     5. History of CVA on ASA and Plavix pre op. Now on ASA and Eliquis    6. Cervical/lumbar spine disease. Followed by Dr. Ellene Route.     7. Right shoulder prosthetic infection/abscess. S/p surgical revision.Infection resolved      8. Chronic diastolic CHF. Well compensated.    Current medicines are reviewed at length with the patient today.  The patient does not have concerns regarding medicines.  The following changes have been made:  See above  Labs/ tests ordered today include:   No orders of the defined types were placed in this encounter.    Disposition:   FU 6 weeks.  Signed, Javis Abboud Martinique, MD  05/21/2019 1:55 PM    [facility] [address], [place], [place], [address] Phone [phone], Fax [phone]

## 2019-05-22 ENCOUNTER — Ambulatory Visit (HOSPITAL_COMMUNITY): Payer: Medicare HMO

## 2019-05-22 ENCOUNTER — Other Ambulatory Visit: Payer: Self-pay | Admitting: Physician Assistant

## 2019-05-22 DIAGNOSIS — M170 Bilateral primary osteoarthritis of knee: Secondary | ICD-10-CM | POA: Diagnosis not present

## 2019-05-22 DIAGNOSIS — I10 Essential (primary) hypertension: Secondary | ICD-10-CM | POA: Diagnosis not present

## 2019-05-22 DIAGNOSIS — I25118 Atherosclerotic heart disease of native coronary artery with other forms of angina pectoris: Secondary | ICD-10-CM | POA: Diagnosis not present

## 2019-05-22 DIAGNOSIS — I480 Paroxysmal atrial fibrillation: Secondary | ICD-10-CM | POA: Diagnosis not present

## 2019-05-22 DIAGNOSIS — I25119 Atherosclerotic heart disease of native coronary artery with unspecified angina pectoris: Secondary | ICD-10-CM | POA: Diagnosis not present

## 2019-05-22 DIAGNOSIS — I2510 Atherosclerotic heart disease of native coronary artery without angina pectoris: Secondary | ICD-10-CM | POA: Diagnosis not present

## 2019-05-22 DIAGNOSIS — E785 Hyperlipidemia, unspecified: Secondary | ICD-10-CM | POA: Diagnosis not present

## 2019-05-24 ENCOUNTER — Ambulatory Visit (HOSPITAL_COMMUNITY): Payer: Medicare HMO

## 2019-05-27 ENCOUNTER — Ambulatory Visit: Payer: Medicare HMO | Admitting: Cardiology

## 2019-05-27 DIAGNOSIS — I10 Essential (primary) hypertension: Secondary | ICD-10-CM | POA: Diagnosis not present

## 2019-05-27 DIAGNOSIS — I25118 Atherosclerotic heart disease of native coronary artery with other forms of angina pectoris: Secondary | ICD-10-CM | POA: Diagnosis not present

## 2019-05-27 DIAGNOSIS — I480 Paroxysmal atrial fibrillation: Secondary | ICD-10-CM | POA: Diagnosis not present

## 2019-05-27 DIAGNOSIS — R69 Illness, unspecified: Secondary | ICD-10-CM | POA: Diagnosis not present

## 2019-05-27 DIAGNOSIS — M25571 Pain in right ankle and joints of right foot: Secondary | ICD-10-CM | POA: Diagnosis not present

## 2019-05-27 DIAGNOSIS — G894 Chronic pain syndrome: Secondary | ICD-10-CM | POA: Diagnosis not present

## 2019-05-28 ENCOUNTER — Other Ambulatory Visit: Payer: Self-pay

## 2019-05-28 ENCOUNTER — Encounter: Payer: Self-pay | Admitting: Cardiology

## 2019-05-28 ENCOUNTER — Ambulatory Visit: Payer: Medicare HMO | Admitting: Cardiology

## 2019-05-28 VITALS — BP 125/81 | HR 71 | Temp 97.9°F | Ht 67.0 in | Wt 227.4 lb

## 2019-05-28 DIAGNOSIS — Z951 Presence of aortocoronary bypass graft: Secondary | ICD-10-CM

## 2019-05-28 DIAGNOSIS — E7800 Pure hypercholesterolemia, unspecified: Secondary | ICD-10-CM

## 2019-05-28 DIAGNOSIS — I480 Paroxysmal atrial fibrillation: Secondary | ICD-10-CM | POA: Diagnosis not present

## 2019-05-28 DIAGNOSIS — I25118 Atherosclerotic heart disease of native coronary artery with other forms of angina pectoris: Secondary | ICD-10-CM | POA: Diagnosis not present

## 2019-05-28 DIAGNOSIS — I10 Essential (primary) hypertension: Secondary | ICD-10-CM

## 2019-05-28 MED ORDER — ASPIRIN EC 81 MG PO TBEC: 81.0000 mg | DELAYED_RELEASE_TABLET | Freq: Every day | ORAL | 3 refills | Status: DC

## 2019-05-28 NOTE — Addendum Note (Signed)
Addended by: Kathyrn Lass on: 05/28/2019 02:46 PM   Modules accepted: Orders

## 2019-05-28 NOTE — Progress Notes (Signed)
Cardiology Office Note   Date:  05/28/2019   ID:  Jake Dunn, Jake Dunn [birth year], MRN [id number]  PCP:  Harlan Stains, MD  Cardiologist:   Teddrick Mallari Martinique, MD   Chief Complaint  Patient presents with  . Coronary Artery Disease  . Atrial Fibrillation      History of Present Illness: Jake Dunn is a 79 y.o. male who is seen for follow up s/p CABG. He has a PMH of CAD  (70% 73rd diag '14), GERD, HTN, HL, renal cell CA s/p left nephrectomy. Last cardiac catheterization in 2014 showed nonobstructive CAD with 70% lesion in third diagonal that was treated medically. He is fairly active and recently undergoing physical therapy after his right shoulder surgery 5 months ago. He presented with left sided chest pressure on 08/15/2016 while at Urology office. BP was quite high that day. He underwent cardiac catheterization on 08/16/2016, this revealed two-vessel CAD with moderate calcified stenosis in the mid LAD, second diagonal, ramus intermedius, ostial and proximal left circumflex and a severe stenosis of the second PLA branch of the RCA. Overall normal LVEDP and normal LVEF. Recommended medical therapy and outpatient exercise stress Myoview. While the LAD and the diagonal only showed moderate progression compared to the 2010/2014 studies, the left circumflex and intermediate stenosis had progressed. Echocardiogram obtained on 08/17/2016 showed EF 0000000, grade 2 diastolic dysfunction, severely dilated left atrium, PA peak pressure 30 mmHg. Lexiscan Myoview obtained on 08/23/2016 showed EF 51%, no perfusion defect at stress, overall low risk study.   He was seen in November with worsening symptoms of chest pain and dyspnea. This led to a cardiac cath showing 3 vessel obstructive CAD. He underwent CABG on 04/02/19 by Dr Kipp Brood. CABG X 4.  LIMA LAD, RSVG PLV, Ramus, and D2  (T graft off the ramus vein graft). Post op course complicated by Afib. He was loaded with amiodarone.   On follow up today he  is doing very well. He still has some chest soreness from the surgery. No dyspnea, edema. Had only one episode of palpitations that was short. He is walking a mile a day. BP has been well controlled.     Past Medical History:  Diagnosis Date  . Adenomatous polyp   . Anxiety   . Arthritis    "knees, ankles, shoulders" (08/15/2016)  . CAD (coronary artery disease)    s/p cath in 2014 showing significant stenosis in the diagonal side branches and in the RV marginal branch. These vessels were small and diffusely diseased. He is managed medically. 08/16/16 Cath, no disease progression  . Chronic lower back pain   . Disc disease, degenerative, cervical   . GERD (gastroesophageal reflux disease)   . Gout   . Hyperlipidemia   . Hypertension   . Osteoarthritis   . Renal cell carcinoma    left kidney removal  . Stroke [facility])    MINI STROKE 15+ YRS AGO, no residual  . Stroke ([place])    "I've had 2 or 3"; denies residual on 08/15/2016  . Syncope and collapse     Past Surgical History:  Procedure Laterality Date  . ANTERIOR CERVICAL DECOMP/DISCECTOMY FUSION  02/2004; 04/2005   [name] 5/14/2012Marland Kitchen [name] 09/20/2010  . APPLICATION OF ROBOTIC ASSISTANCE FOR SPINAL PROCEDURE N/A 01/02/2018   Procedure: APPLICATION OF ROBOTIC ASSISTANCE FOR SPINAL PROCEDURE;  Surgeon: Kristeen Miss, MD;  Location: [place];  Service: Neurosurgery;  Laterality: N/A;  . BACK SURGERY    . CARDIAC CATHETERIZATION  12/14/2008   ef 50-55%. SHOWED SIGNIFICANT STENOSIS AND TO DIAGONAL SIDE BRANCHES AND IN THE RIGHT VENTRICULAR MARGINAL BRANCH. THESE VESSELS WERE SMALL AND DIFFUSELY DISEASED  . CARDIOVASCULAR STRESS TEST  12/10/2009   EF 61%. EKG negative. Mild peri ischemia. Managed medically.   . CARPAL TUNNEL RELEASE Right 11/2005   [name] 09/20/2010  . CARPAL TUNNEL RELEASE Left 06/2007   [name] 5/132012  . COLONOSCOPY  11/2004   [name] 09/20/2010  . COLONOSCOPY W/ BIOPSIES AND POLYPECTOMY  09/2002   [name] 09/20/2010  . CORONARY  ARTERY BYPASS GRAFT N/A 04/02/2019   Procedure: CORONARY ARTERY BYPASS GRAFTING (CABG) using LIMA to LAD; Endoscopically harvested right greater saphenous vein to the PLB, Ramus, and Diag 2.;  Surgeon: Lajuana Matte, MD;  Location: [place];  Service: Open Heart Surgery;  Laterality: N/A;  . INCISION AND DRAINAGE ABSCESS Right 10/18/2016   Procedure: INCISION AND DRAINAGE ABSCESS RIGHT SHOULDER;  Surgeon: [name], MD;  Location: [place];  Service: Orthopedics;  Laterality: Right;  . INCISION AND DRAINAGE OF WOUND Right 08/2005   shoulder/notes 09/20/2010  . IR FLUORO GUIDE CV LINE LEFT  10/19/2016  . IR US GUIDE VASC ACCESS LEFT  10/19/2016  . IRRIGATION AND DEBRIDEMENT SHOULDER Right 12/22/2016   Procedure: IRRIGATION AND DEBRIDEMENT RIGHT SHOULDER;  Surgeon: [name], MD;  Location: [place];  Service: Orthopedics;  Laterality: Right;  . JOINT REPLACEMENT    . LEFT HEART CATH AND CORONARY ANGIOGRAPHY N/A 08/16/2016   Procedure: Left Heart Cath and Coronary Angiography;  Surgeon: [name], MD;  Location: [place] CV LAB;  Service: Cardiovascular;  Laterality: N/A;  . LEFT HEART CATH AND CORONARY ANGIOGRAPHY N/A 03/26/2019   Procedure: LEFT HEART CATH AND CORONARY ANGIOGRAPHY;  Surgeon: Martinique, Erminia Mcnew M, MD;  Location: [place] CV [facility];  Service: Cardiovascular;  Laterality: N/A;  . LEFT HEART CATHETERIZATION WITH CORONARY ANGIOGRAM N/A 08/09/2012   Procedure: LEFT HEART CATHETERIZATION WITH CORONARY ANGIOGRAM;  Surgeon: Marbella Markgraf M Martinique, MD;  Location: [facility] CATH LAB;  Service: Cardiovascular;  Laterality: N/A;  . LUMBAR FUSION    . NEPHRECTOMY Left early 2000s  . REPLACEMENT TOTAL KNEE Right 08/2008   [name] 09/07/2010  . SHOULDER ARTHROSCOPY WITH ROTATOR CUFF REPAIR Right 06/2005   [name] 09/20/2010  . TOTAL KNEE ARTHROPLASTY  06/08/2011   Procedure: TOTAL KNEE ARTHROPLASTY;  Surgeon: [name], MD;  Location: [place];  Service: Orthopedics;  Laterality:  Left;  . TOTAL SHOULDER ARTHROPLASTY Right 04/06/2016   Procedure: RIGHT REVERSE TOTAL SHOULDER ARTHROPLASTY;  Surgeon: [name], MD;  Location: [place];  Service: Orthopedics;  Laterality: Right;  . TOTAL SHOULDER REVISION Right 12/22/2016   Procedure: RIGHT SHOULDER GLENOID AND HUMERAL COMPONENT REVISION;  Surgeon: [name], MD;  Location: [place];  Service: Orthopedics;  Laterality: Right;  . US ECHOCARDIOGRAPHY  12/15/2008   EF 60-65%  . US ECHOCARDIOGRAPHY  09/28/2004   EF 55-60%     Current Outpatient Medications  Medication Sig Dispense Refill  . acyclovir (ZOVIRAX) 400 MG tablet Take 400 mg by mouth 3 (three) times daily as needed (For cold sores). .  5  . allopurinol (ZYLOPRIM) 300 MG tablet Take 300 mg by mouth daily as needed (for gout flares).     Marland Kitchen atorvastatin (LIPITOR) 80 MG tablet Take 1 tablet (80 mg total) by mouth every evening. 30 tablet 6  . EPINEPHrine 0.3 mg/0.3 mL IJ SOAJ injection Inject 0.3 mg into the muscle  as needed for anaphylaxis.   11  . furosemide (LASIX) 40 MG tablet Take 1 tablet (40 mg total) by mouth daily. For one week;then take 20 mg daily thereafter 30 tablet 1  . LORazepam (ATIVAN) 0.5 MG tablet Take 0.5 mg by mouth 2 (two) times daily as needed for anxiety.   2  . metoprolol tartrate (LOPRESSOR) 25 MG tablet Take 25 mg by mouth 2 (two) times daily.    Marland Kitchen oxyCODONE (OXY IR/ROXICODONE) 5 MG immediate release tablet Take 1 tablet (5 mg total) by mouth every 6 (six) hours as needed for severe pain. 28 tablet 0  . pantoprazole (PROTONIX) 40 MG tablet Take 40 mg by mouth every evening.     . potassium chloride SA (KLOR-CON) 20 MEQ tablet Take 1 tablet (20 mEq total) by mouth daily. For one week then stop 7 tablet 0  . traMADol (ULTRAM) 50 MG tablet Take 1 tablet (50 mg total) by mouth every 6 (six) hours as needed. 40 tablet 0   Current Facility-Administered Medications  Medication Dose Route Frequency Provider Last Rate Last Admin  . sodium  chloride flush (NS) 0.9 % injection 3 mL  3 mL Intravenous Q12H Martinique, Vickey Ewbank M, MD        Allergies:   Adhesive [tape] and Latex    Social History:  The patient  reports that he quit smoking about 30 years ago. He has never used smokeless tobacco. He reports current alcohol use. He reports that he does not use drugs.   Family History:  The patient's family history includes Heart attack in his father; Heart disease in his brother.    ROS:  Please see the history of present illness.   Otherwise, review of systems are positive for none.   All other systems are reviewed and negative.    PHYSICAL EXAM: VS:  BP 125/81   Pulse 71   Temp 97.9 F (36.6 C)   Ht 5\' 7"  (1.702 m)   Wt 227 lb 6.4 oz (103.1 kg)   SpO2 99%   BMI 35.62 kg/m  , BMI Body mass index is 35.62 kg/m. GEN: Well nourished, well developed, in no acute distress  HEENT: normal  Neck: no JVD, carotid bruits, or masses Cardiac: RRR; no murmurs, rubs, or gallops,no edema. Sternal incision is healing well.  Respiratory:  clear to auscultation bilaterally, normal work of breathing GI: soft, nontender, nondistended, + BS MS: no deformity or atrophy  Skin: warm and dry, no rash Neuro:  Strength and sensation are intact Psych: euthymic mood, full affect   EKG:  EKG is  Not ordered today.     Recent Labs: 03/29/2019: B Natriuretic Peptide 50.2; TSH 3.340 04/01/2019: ALT 15 04/03/2019: Magnesium 2.2 04/05/2019: BUN 24; Creatinine, Ser 1.24; Hemoglobin 9.1; Platelets 123; Potassium 4.2; Sodium 134    Lipid Panel    Component Value Date/Time   CHOL 98 03/30/2019 0212   TRIG 174 (H) 03/30/2019 0212   HDL 29 (L) 03/30/2019 0212   CHOLHDL 3.4 03/30/2019 0212   VLDL 35 03/30/2019 0212   LDLCALC 34 03/30/2019 0212    Dated 01/25/19: cholesterol 117, triglycerides 125, HDL 39, LDL 53. CMET, CBC, TSH normal  Wt Readings from Last 3 Encounters:  05/28/19 227 lb 6.4 oz (103.1 kg)  05/14/19 225 lb (102.1 kg)  04/15/19  225 lb 6.4 oz (102.2 kg)      Other studies Reviewed: Additional studies/ records that were reviewed today include:   Cath 03/26/19: Procedures  LEFT HEART CATH AND CORONARY ANGIOGRAPHY  Conclusion    Prox RCA lesion is 45% stenosed.  2nd RPL lesion is 80% stenosed.  Ost LM to Mid LM lesion is 50% stenosed.  Ost Cx to Mid Cx lesion is 80% stenosed.  Ramus lesion is 90% stenosed.  Mid LAD lesion is 70% stenosed.  2nd Diag lesion is 60% stenosed.  The left ventricular systolic function is normal.  LV end diastolic pressure is normal.  The left ventricular ejection fraction is 55-65% by visual estimate.   1. Three vessel obstructive, Calcific CAD 2. Normal LV function 3. Normal LVEDP  Plan: recommend referral to CT surgery for CABG. Will stop Plavix.    Echo 03/30/19: IMPRESSIONS    1. Left ventricular ejection fraction, by visual estimation, is 55 to 60%. The left ventricle has normal function. There is no left ventricular hypertrophy.  2. Global right ventricle has mildly reduced systolic function.The right ventricular size is mildly enlarged. No increase in right ventricular wall thickness.  3. Left atrial size was normal.  4. Right atrial size was mildly dilated.  5. The mitral valve is normal in structure. No evidence of mitral valve regurgitation.  6. The tricuspid valve is normal in structure. Tricuspid valve regurgitation is trivial.  7. The aortic valve is normal in structure. Aortic valve regurgitation is not visualized. No evidence of aortic valve sclerosis or stenosis.  8. The pulmonic valve was grossly normal. Pulmonic valve regurgitation is not visualized.  9. Aortic dilatation noted. 10. There is mild dilatation of the ascending aorta measuring 38 mm. 11. Normal pulmonary artery systolic pressure. 12. The atrial septum is grossly normal.   ASSESSMENT AND PLAN:  1. CAD  Now s/p CABG x 4 on 04/02/19: his recovery is going well. Recommend  reducing ASA to 81 mg daily.  Continue high dose statin and metoprolol.   2. Atrial fibrillation post op CABG.  Maintaining NSR. Will discontinue amiodarone and Eliquis. Monitor for recurrence  3. HTN: Blood pressure is under excellent control now. Was on multiple meds prior to surgery. Now only on low dose metoprolol.   4. HLD: Continue on Lipitor 80 mg daily.Excellent control.     5. History of CVA on ASA and Plavix pre op. Now on ASA  Only.     6. Cervical/lumbar spine disease. Followed by Dr. Ellene Route.     7. Right shoulder prosthetic infection/abscess. S/p surgical revision.Infection resolved      8. Chronic diastolic CHF. Well compensated.    Current medicines are reviewed at length with the patient today.  The patient does not have concerns regarding medicines.  The following changes have been made:  See above  Labs/ tests ordered today include:   No orders of the defined types were placed in this encounter.    Disposition:   FU 6 months  Signed, Adya Wirz Martinique, MD  05/28/2019 12:07 PM    [facility] [address] [place], [place], [address] Phone [phone], Fax [phone]

## 2019-05-28 NOTE — Patient Instructions (Signed)
Stop Amiodarone (Pacerone) and Eliquis (Apixiban).  Reduce ASA to 81 mg daily

## 2019-05-29 ENCOUNTER — Ambulatory Visit (HOSPITAL_COMMUNITY): Payer: Medicare HMO

## 2019-05-31 ENCOUNTER — Ambulatory Visit (HOSPITAL_COMMUNITY): Payer: Medicare HMO

## 2019-06-03 ENCOUNTER — Ambulatory Visit (HOSPITAL_COMMUNITY): Payer: Medicare HMO

## 2019-06-05 ENCOUNTER — Ambulatory Visit (HOSPITAL_COMMUNITY): Payer: Medicare HMO

## 2019-06-07 ENCOUNTER — Ambulatory Visit (HOSPITAL_COMMUNITY): Payer: Medicare HMO

## 2019-06-10 ENCOUNTER — Ambulatory Visit (HOSPITAL_COMMUNITY): Payer: Medicare HMO

## 2019-06-12 ENCOUNTER — Ambulatory Visit (HOSPITAL_COMMUNITY): Payer: Medicare HMO

## 2019-06-14 ENCOUNTER — Ambulatory Visit (HOSPITAL_COMMUNITY): Payer: Medicare HMO

## 2019-06-17 ENCOUNTER — Ambulatory Visit (HOSPITAL_COMMUNITY): Payer: Medicare HMO

## 2019-06-19 ENCOUNTER — Ambulatory Visit (HOSPITAL_COMMUNITY): Payer: Medicare HMO

## 2019-06-20 ENCOUNTER — Telehealth (HOSPITAL_COMMUNITY): Payer: Self-pay

## 2019-06-20 NOTE — Telephone Encounter (Signed)
Pt insurance is active and benefits verified through [facility] Co-pay $45, DED 0/0 met, out of pocket $5,000/$64 met, co-insurance 0%. no pre-authorization required. Passport, 06/20/2019_0 :38am, REF# [id number]  Will contact patient to see if he is interested in the Cardiac Rehab Program. If interested, patient will need to complete follow up appt. Once completed, patient will be contacted for scheduling upon review by the RN Navigator.

## 2019-06-21 ENCOUNTER — Ambulatory Visit (HOSPITAL_COMMUNITY): Payer: Medicare HMO

## 2019-06-21 ENCOUNTER — Telehealth (HOSPITAL_COMMUNITY): Payer: Self-pay

## 2019-06-21 NOTE — Telephone Encounter (Signed)
Called pt regarding cardiac rehab phase II reopening. Pt declined participating in program. Pt states he does not think he needs it. Will close out referral.   Carma Lair MS, ACSM CEP 3:30 PM 06/21/2019

## 2019-06-24 ENCOUNTER — Ambulatory Visit (HOSPITAL_COMMUNITY): Payer: Medicare HMO

## 2019-06-26 ENCOUNTER — Ambulatory Visit (HOSPITAL_COMMUNITY): Payer: Medicare HMO

## 2019-06-28 ENCOUNTER — Ambulatory Visit (HOSPITAL_COMMUNITY): Payer: Medicare HMO

## 2019-07-01 ENCOUNTER — Ambulatory Visit (HOSPITAL_COMMUNITY): Payer: Medicare HMO

## 2019-07-03 ENCOUNTER — Ambulatory Visit (HOSPITAL_COMMUNITY): Payer: Medicare HMO

## 2019-07-05 ENCOUNTER — Ambulatory Visit (HOSPITAL_COMMUNITY): Payer: Medicare HMO

## 2019-07-08 ENCOUNTER — Ambulatory Visit (HOSPITAL_COMMUNITY): Payer: Medicare HMO

## 2019-07-10 ENCOUNTER — Ambulatory Visit (HOSPITAL_COMMUNITY): Payer: Medicare HMO

## 2019-07-12 ENCOUNTER — Ambulatory Visit (HOSPITAL_COMMUNITY): Payer: Medicare HMO

## 2019-07-22 ENCOUNTER — Encounter: Payer: Self-pay | Admitting: Cardiology

## 2019-08-10 ENCOUNTER — Other Ambulatory Visit: Payer: Self-pay | Admitting: Cardiology

## 2019-08-15 ENCOUNTER — Telehealth: Payer: Self-pay | Admitting: Cardiology

## 2019-08-15 NOTE — Telephone Encounter (Signed)
Pharmacy updated in system to [facility] on [address], [place]

## 2019-08-15 NOTE — Telephone Encounter (Signed)
Patient wife called to update his pharmacy.  His new pharmacy is now [facility] on [address] in [place].

## 2019-08-19 NOTE — Progress Notes (Signed)
Cardiology Office Note   Date:  08/20/2019   ID:  Jake Dunn, Jake Dunn Jun 30, 1940, MRN [id number]  PCP:  Harlan Stains, MD  Cardiologist:   Bree Heinzelman Martinique, MD   Chief Complaint  Patient presents with  . Shortness of Breath  . Coronary Artery Disease      History of Present Illness: Jake Dunn is a 79 y.o. male who is seen for follow up s/p CABG. He has a PMH of CAD, GERD, HTN, HLD, renal cell CA s/p left nephrectomy. Cardiac catheterization in 2014 showed nonobstructive CAD with 70% lesion in third diagonal that was treated medically.    He presented with left sided chest pressure on 08/15/2016 while at Urology office. BP was quite high that day. He underwent cardiac catheterization on 08/16/2016, this revealed two-vessel CAD with moderate calcified stenosis in the mid LAD, second diagonal, ramus intermedius, ostial and proximal left circumflex and a severe stenosis of the second PLA branch of the RCA. Overall normal LVEDP and normal LVEF. Recommended medical therapy and outpatient exercise stress Myoview. While the LAD and the diagonal only showed moderate progression compared to the 2010/2014 studies, the left circumflex and intermediate stenosis had progressed. Echocardiogram obtained on 08/17/2016 showed EF 0000000, grade 2 diastolic dysfunction, severely dilated left atrium, PA peak pressure 30 mmHg. Lexiscan Myoview obtained on 08/23/2016 showed EF 51%, no perfusion defect at stress, overall low risk study.   He was seen in November 2020 with worsening symptoms of chest pain and dyspnea. This led to a cardiac cath showing 3 vessel obstructive CAD. He underwent CABG on 04/02/19 by Dr Kipp Brood. CABG X 4.  LIMA LAD, RSVG PLV, Ramus, and D2  (T graft off the ramus vein graft). Post op course complicated by Afib. He was loaded with amiodarone. On his last visit in January amiodarone and Eliquis were discontinued.   He states he was doing well up until 2 weeks ago. Was walking 1.5-2 miles  every day. Over the past 2 days he states he gets extremely weak with activity. His legs give out. He is SOB and has to gasp for breath at times. Occasionally feels like heart is fluttering. Sometimes has to sit up to get his breath. He has gained 12 lbs but denies any edema. He has some constipation. No bleeding noted. No anginal pain like before his CABG.     Past Medical History:  Diagnosis Date  . Adenomatous polyp   . Anxiety   . Arthritis    "knees, ankles, shoulders" (08/15/2016)  . CAD (coronary artery disease)    s/p cath in 2014 showing significant stenosis in the diagonal side branches and in the RV marginal branch. These vessels were small and diffusely diseased. He is managed medically. 08/16/16 Cath, no disease progression  . Chronic lower back pain   . Disc disease, degenerative, cervical   . GERD (gastroesophageal reflux disease)   . Gout   . Hyperlipidemia   . Hypertension   . Osteoarthritis   . Renal cell carcinoma    left kidney removal  . Stroke [facility])    MINI STROKE 15+ YRS AGO, no residual  . Stroke ([place])    "I've had 2 or 3"; denies residual on 08/15/2016  . Syncope and collapse     Past Surgical History:  Procedure Laterality Date  . ANTERIOR CERVICAL DECOMP/DISCECTOMY FUSION  02/2004; 04/2005   [name] 5/14/2012Marland Kitchen [name] 09/20/2010  . APPLICATION OF ROBOTIC ASSISTANCE FOR SPINAL PROCEDURE N/A 01/02/2018  Procedure: APPLICATION OF ROBOTIC ASSISTANCE FOR SPINAL PROCEDURE;  Surgeon: Kristeen Miss, MD;  Location: [facility];  Service: Neurosurgery;  Laterality: N/A;  . BACK SURGERY    . CARDIAC CATHETERIZATION  12/14/2008   ef 50-55%. SHOWED SIGNIFICANT STENOSIS AND TO DIAGONAL SIDE BRANCHES AND IN THE RIGHT VENTRICULAR MARGINAL BRANCH. THESE VESSELS WERE SMALL AND DIFFUSELY DISEASED  . CARDIOVASCULAR STRESS TEST  12/10/2009   EF 61%. EKG negative. Mild peri ischemia. Managed medically.   . CARPAL TUNNEL RELEASE Right 11/2005   [name] 09/20/2010  . CARPAL TUNNEL RELEASE  Left 06/2007   [name] 5/132012  . COLONOSCOPY  11/2004   [name] 09/20/2010  . COLONOSCOPY W/ BIOPSIES AND POLYPECTOMY  09/2002   [name] 09/20/2010  . CORONARY ARTERY BYPASS GRAFT N/A 04/02/2019   Procedure: CORONARY ARTERY BYPASS GRAFTING (CABG) using LIMA to LAD; Endoscopically harvested right greater saphenous vein to the PLB, Ramus, and Diag 2.;  Surgeon: Lajuana Matte, MD;  Location: [place];  Service: Open Heart Surgery;  Laterality: N/A;  . INCISION AND DRAINAGE ABSCESS Right 10/18/2016   Procedure: INCISION AND DRAINAGE ABSCESS RIGHT SHOULDER;  Surgeon: [name], MD;  Location: [place];  Service: Orthopedics;  Laterality: Right;  . INCISION AND DRAINAGE OF WOUND Right 08/2005   shoulder/notes 09/20/2010  . IR FLUORO GUIDE CV LINE LEFT  10/19/2016  . IR US GUIDE VASC ACCESS LEFT  10/19/2016  . IRRIGATION AND DEBRIDEMENT SHOULDER Right 12/22/2016   Procedure: IRRIGATION AND DEBRIDEMENT RIGHT SHOULDER;  Surgeon: [name], MD;  Location: [place];  Service: Orthopedics;  Laterality: Right;  . JOINT REPLACEMENT    . LEFT HEART CATH AND CORONARY ANGIOGRAPHY N/A 08/16/2016   Procedure: Left Heart Cath and Coronary Angiography;  Surgeon: [name], MD;  Location: [place] CV LAB;  Service: Cardiovascular;  Laterality: N/A;  . LEFT HEART CATH AND CORONARY ANGIOGRAPHY N/A 03/26/2019   Procedure: LEFT HEART CATH AND CORONARY ANGIOGRAPHY;  Surgeon: Martinique, Jerell Demery M, MD;  Location: [place] CV LAB;  Service: Cardiovascular;  Laterality: N/A;  . LEFT HEART CATHETERIZATION WITH CORONARY ANGIOGRAM N/A 08/09/2012   Procedure: LEFT HEART CATHETERIZATION WITH CORONARY ANGIOGRAM;  Surgeon: Stanislaus Kaltenbach M Martinique, MD;  Location: [facility] CATH LAB;  Service: Cardiovascular;  Laterality: N/A;  . LUMBAR FUSION    . NEPHRECTOMY Left early 2000s  . REPLACEMENT TOTAL KNEE Right 08/2008   [name] 09/07/2010  . SHOULDER ARTHROSCOPY WITH ROTATOR CUFF REPAIR Right 06/2005   [name] 09/20/2010    . TOTAL KNEE ARTHROPLASTY  06/08/2011   Procedure: TOTAL KNEE ARTHROPLASTY;  Surgeon: [name], MD;  Location: [place];  Service: Orthopedics;  Laterality: Left;  . TOTAL SHOULDER ARTHROPLASTY Right 04/06/2016   Procedure: RIGHT REVERSE TOTAL SHOULDER ARTHROPLASTY;  Surgeon: [name], MD;  Location: [place];  Service: Orthopedics;  Laterality: Right;  . TOTAL SHOULDER REVISION Right 12/22/2016   Procedure: RIGHT SHOULDER GLENOID AND HUMERAL COMPONENT REVISION;  Surgeon: [name], MD;  Location: [place];  Service: Orthopedics;  Laterality: Right;  . US ECHOCARDIOGRAPHY  12/15/2008   EF 60-65%  . US ECHOCARDIOGRAPHY  09/28/2004   EF 55-60%     Current Outpatient Medications  Medication Sig Dispense Refill  . acyclovir (ZOVIRAX) 400 MG tablet Take 400 mg by mouth 3 (three) times daily as needed (For cold sores). .  5  . allopurinol (ZYLOPRIM) 300 MG tablet Take 300 mg by mouth daily as needed (for gout flares).     Marland Kitchen  aspirin EC 81 MG tablet Take 1 tablet (81 mg total) by mouth daily. 90 tablet 3  . atorvastatin (LIPITOR) 80 MG tablet Take 1 tablet (80 mg total) by mouth every evening. 30 tablet 6  . furosemide (LASIX) 40 MG tablet Take 1 tablet (40 mg total) by mouth daily. For one week;then take 20 mg daily thereafter 30 tablet 1  . LORazepam (ATIVAN) 0.5 MG tablet Take 0.5 mg by mouth 2 (two) times daily as needed for anxiety.   2  . metoprolol tartrate (LOPRESSOR) 25 MG tablet Take 25 mg by mouth 2 (two) times daily.    Marland Kitchen oxyCODONE (OXY IR/ROXICODONE) 5 MG immediate release tablet Take 1 tablet (5 mg total) by mouth every 6 (six) hours as needed for severe pain. 28 tablet 0  . pantoprazole (PROTONIX) 40 MG tablet Take 40 mg by mouth every evening.     . potassium chloride SA (KLOR-CON) 20 MEQ tablet Take 1 tablet (20 mEq total) by mouth daily. For one week then stop 7 tablet 0   Current Facility-Administered Medications  Medication Dose Route Frequency Provider Last Rate Last  Admin  . sodium chloride flush (NS) 0.9 % injection 3 mL  3 mL Intravenous Q12H Martinique, Jaleeya Mcnelly M, MD        Allergies:   Adhesive [tape] and Latex    Social History:  The patient  reports that he quit smoking about 30 years ago. He has never used smokeless tobacco. He reports current alcohol use. He reports that he does not use drugs.   Family History:  The patient's family history includes Heart attack in his father; Heart disease in his brother.    ROS:  Please see the history of present illness.   Otherwise, review of systems are positive for none.   All other systems are reviewed and negative.    PHYSICAL EXAM: VS:  BP 125/78   Pulse 62   Temp 98.1 F (36.7 C)   Ht 5\' 7"  (1.702 m)   Wt 239 lb 12.8 oz (108.8 kg)   SpO2 92%   BMI 37.56 kg/m  , BMI Body mass index is 37.56 kg/m. GEN: Well nourished, well developed, in no acute distress  HEENT: normal  Neck: no JVD, carotid bruits, or masses Cardiac: RRR; no murmurs, rubs, or gallops,no edema. Sternal incision is healing well.  Respiratory:  clear to auscultation bilaterally, normal work of breathing GI: soft, nontender, nondistended, + BS MS: no deformity or atrophy  Skin: warm and dry, no rash Neuro:  Strength and sensation are intact Psych: euthymic mood, full affect   EKG:  EKG is  ordered today. Sinus brady rate 56. Normal. I have personally reviewed and interpreted this study.      Recent Labs: 03/29/2019: B Natriuretic Peptide 50.2; TSH 3.340 04/01/2019: ALT 15 04/03/2019: Magnesium 2.2 04/05/2019: BUN 24; Creatinine, Ser 1.24; Hemoglobin 9.1; Platelets 123; Potassium 4.2; Sodium 134    Lipid Panel    Component Value Date/Time   CHOL 98 03/30/2019 0212   TRIG 174 (H) 03/30/2019 0212   HDL 29 (L) 03/30/2019 0212   CHOLHDL 3.4 03/30/2019 0212   VLDL 35 03/30/2019 0212   LDLCALC 34 03/30/2019 0212    Dated 01/25/19: cholesterol 117, triglycerides 125, HDL 39, LDL 53. CMET, CBC, TSH normal  Wt Readings  from Last 3 Encounters:  08/20/19 239 lb 12.8 oz (108.8 kg)  05/28/19 227 lb 6.4 oz (103.1 kg)  05/14/19 225 lb (102.1 kg)  Other studies Reviewed: Additional studies/ records that were reviewed today include:   Cath 03/26/19: Procedures  LEFT HEART CATH AND CORONARY ANGIOGRAPHY  Conclusion    Prox RCA lesion is 45% stenosed.  2nd RPL lesion is 80% stenosed.  Ost LM to Mid LM lesion is 50% stenosed.  Ost Cx to Mid Cx lesion is 80% stenosed.  Ramus lesion is 90% stenosed.  Mid LAD lesion is 70% stenosed.  2nd Diag lesion is 60% stenosed.  The left ventricular systolic function is normal.  LV end diastolic pressure is normal.  The left ventricular ejection fraction is 55-65% by visual estimate.   1. Three vessel obstructive, Calcific CAD 2. Normal LV function 3. Normal LVEDP  Plan: recommend referral to CT surgery for CABG. Will stop Plavix.    Echo 03/30/19: IMPRESSIONS    1. Left ventricular ejection fraction, by visual estimation, is 55 to 60%. The left ventricle has normal function. There is no left ventricular hypertrophy.  2. Global right ventricle has mildly reduced systolic function.The right ventricular size is mildly enlarged. No increase in right ventricular wall thickness.  3. Left atrial size was normal.  4. Right atrial size was mildly dilated.  5. The mitral valve is normal in structure. No evidence of mitral valve regurgitation.  6. The tricuspid valve is normal in structure. Tricuspid valve regurgitation is trivial.  7. The aortic valve is normal in structure. Aortic valve regurgitation is not visualized. No evidence of aortic valve sclerosis or stenosis.  8. The pulmonic valve was grossly normal. Pulmonic valve regurgitation is not visualized.  9. Aortic dilatation noted. 10. There is mild dilatation of the ascending aorta measuring 38 mm. 11. Normal pulmonary artery systolic pressure. 12. The atrial septum is grossly  normal.   ASSESSMENT AND PLAN:  1. CAD  Now s/p CABG x 4 on 04/02/19: was doing very well until 2 weeks ago with symptoms of marked fatigue and dyspnea. No edema on exam. No anginal symptoms like prior to CABG.    Continue ASA,  high dose statin and metoprolol. Etiology of his current symptoms is unclear.   2. Atrial fibrillation post op CABG.  Maintaining NSR. Will discontinue amiodarone and Eliquis. Monitor for recurrence  3. HTN: Blood pressure is under excellent control now. Was on multiple meds prior to surgery. Now only on low dose metoprolol and lasix.  4. HLD: Continue on Lipitor 80 mg daily.    5. History of CVA on ASA and Plavix pre op. Now on ASA  Only.     6. Cervical/lumbar spine disease. Followed by Dr. Ellene Route.     7. Chronic diastolic CHF. Volume status looks good on exam. On lasix 40 mg daily. Will check CXR. Update complete lab work today. May need to consider sleep study.    Current medicines are reviewed at length with the patient today.  The patient does not have concerns regarding medicines.  The following changes have been made:  See above  Labs/ tests ordered today include:   No orders of the defined types were placed in this encounter.    Disposition:   FU TBD  Signed, Marthena Whitmyer Martinique, MD  08/20/2019 2:39 PM    [facility] [address] [place], [place], [address] Phone [phone], Fax [phone]

## 2019-08-20 ENCOUNTER — Encounter: Payer: Self-pay | Admitting: Cardiology

## 2019-08-20 ENCOUNTER — Ambulatory Visit
Admission: RE | Admit: 2019-08-20 | Discharge: 2019-08-20 | Disposition: A | Discharge status: Home / Self Care | Payer: Medicare HMO | Source: Ambulatory Visit | Attending: Cardiology | Admitting: Cardiology

## 2019-08-20 ENCOUNTER — Ambulatory Visit: Payer: Medicare HMO | Admitting: Cardiology

## 2019-08-20 ENCOUNTER — Other Ambulatory Visit: Payer: Self-pay

## 2019-08-20 VITALS — BP 125/78 | HR 62 | Temp 98.1°F | Ht 67.0 in | Wt 239.8 lb

## 2019-08-20 DIAGNOSIS — R5383 Other fatigue: Secondary | ICD-10-CM

## 2019-08-20 DIAGNOSIS — Z951 Presence of aortocoronary bypass graft: Secondary | ICD-10-CM | POA: Diagnosis not present

## 2019-08-20 DIAGNOSIS — I25118 Atherosclerotic heart disease of native coronary artery with other forms of angina pectoris: Secondary | ICD-10-CM

## 2019-08-20 DIAGNOSIS — R0602 Shortness of breath: Secondary | ICD-10-CM | POA: Diagnosis not present

## 2019-08-20 DIAGNOSIS — I480 Paroxysmal atrial fibrillation: Secondary | ICD-10-CM | POA: Diagnosis not present

## 2019-08-20 DIAGNOSIS — I10 Essential (primary) hypertension: Secondary | ICD-10-CM

## 2019-08-20 NOTE — Addendum Note (Signed)
Addended by: Kathyrn Lass on: 08/20/2019 03:14 PM   Modules accepted: Orders

## 2019-08-20 NOTE — Patient Instructions (Signed)
Medication Instructions:  Continue same medications *If you need a refill on your cardiac medications before your next appointment, please call your pharmacy*   Lab Work: Cbc,cmet,lipid panel,tsh today    Testing/Procedures: Chest xray today at [place] on [address].   Follow-Up: At [facility], you and your health needs are our priority.  As part of our continuing mission to provide you with exceptional heart care, we have created designated Provider Care Teams.  These Care Teams include your primary Cardiologist (physician) and Advanced Practice Providers (APPs -  Physician Assistants and Nurse Practitioners) who all work together to provide you with the care you need, when you need it.  We recommend signing up for the patient portal called "MyChart".  Sign up information is provided on this After Visit Summary.  MyChart is used to connect with patients for Virtual Visits (Telemedicine).  Patients are able to view lab/test results, encounter notes, upcoming appointments, etc.  Non-urgent messages can be sent to your provider as well.   To learn more about what you can do with MyChart, go to [email or web address].    Your next appointment:  To be determined after test   The format for your next appointment: Office   Provider:  Dr.Jordan

## 2019-08-21 LAB — COMPREHENSIVE METABOLIC PANEL
ALT: 9 IU/L (ref 0–44)
AST: 10 IU/L (ref 0–40)
Albumin/Globulin Ratio: 1.7 (ref 1.2–2.2)
Albumin: 4.4 g/dL (ref 3.7–4.7)
Alkaline Phosphatase: 111 IU/L (ref 39–117)
BUN/Creatinine Ratio: 13 (ref 10–24)
BUN: 19 mg/dL (ref 8–27)
Bilirubin Total: 0.4 mg/dL (ref 0.0–1.2)
CO2: 22 mmol/L (ref 20–29)
Calcium: 9.3 mg/dL (ref 8.6–10.2)
Chloride: 103 mmol/L (ref 96–106)
Creatinine, Ser: 1.41 mg/dL — ABNORMAL HIGH (ref 0.76–1.27)
GFR calc Af Amer: 56 mL/min/{1.73_m2} — ABNORMAL LOW (ref >59)
GFR calc non Af Amer: 48 mL/min/{1.73_m2} — ABNORMAL LOW (ref >59)
Globulin, Total: 2.6 g/dL (ref 1.5–4.5)
Glucose: 91 mg/dL (ref 65–99)
Potassium: 4.5 mmol/L (ref 3.5–5.2)
Sodium: 137 mmol/L (ref 134–144)
Total Protein: 7 g/dL (ref 6.0–8.5)

## 2019-08-21 LAB — LIPID PANEL
Chol/HDL Ratio: 2.8 ratio (ref 0.0–5.0)
Cholesterol, Total: 129 mg/dL (ref 100–199)
HDL: 46 mg/dL (ref >39)
LDL Chol Calc (NIH): 54 mg/dL (ref 0–99)
Triglycerides: 178 mg/dL — ABNORMAL HIGH (ref 0–149)
VLDL Cholesterol Cal: 29 mg/dL (ref 5–40)

## 2019-08-21 LAB — CBC WITH DIFFERENTIAL/PLATELET
Basophils Absolute: 0.1 10*3/uL (ref 0.0–0.2)
Basos: 1 %
EOS (ABSOLUTE): 0.2 10*3/uL (ref 0.0–0.4)
Eos: 3 %
Hematocrit: 37.4 % — ABNORMAL LOW (ref 37.5–51.0)
Hemoglobin: 12.2 g/dL — ABNORMAL LOW (ref 13.0–17.7)
Immature Grans (Abs): 0 10*3/uL (ref 0.0–0.1)
Immature Granulocytes: 0 %
Lymphocytes Absolute: 1.5 10*3/uL (ref 0.7–3.1)
Lymphs: 20 %
MCH: 26.6 pg (ref 26.6–33.0)
MCHC: 32.6 g/dL (ref 31.5–35.7)
MCV: 82 fL (ref 79–97)
Monocytes Absolute: 1 10*3/uL — ABNORMAL HIGH (ref 0.1–0.9)
Monocytes: 14 %
Neutrophils Absolute: 4.5 10*3/uL (ref 1.4–7.0)
Neutrophils: 62 %
Platelets: 261 10*3/uL (ref 150–450)
RBC: 4.58 x10E6/uL (ref 4.14–5.80)
RDW: 15.3 % (ref 11.6–15.4)
WBC: 7.3 10*3/uL (ref 3.4–10.8)

## 2019-08-21 LAB — TSH: TSH: 2.03 u[IU]/mL (ref 0.450–4.500)

## 2019-08-22 ENCOUNTER — Other Ambulatory Visit: Payer: Self-pay | Admitting: Cardiology

## 2019-08-22 ENCOUNTER — Telehealth: Payer: Self-pay | Admitting: Cardiology

## 2019-08-22 ENCOUNTER — Other Ambulatory Visit: Payer: Self-pay

## 2019-08-22 DIAGNOSIS — R0602 Shortness of breath: Secondary | ICD-10-CM

## 2019-08-22 DIAGNOSIS — I480 Paroxysmal atrial fibrillation: Secondary | ICD-10-CM

## 2019-08-22 NOTE — Telephone Encounter (Signed)
New Message    Pt c/o medication issue:  1. Name of Medication: Amiodarone 200 mg  2. How are you currently taking this medication (dosage and times per day)? 1 x daily   3. Are you having a reaction (difficulty breathing--STAT)? No   4. What is your medication issue? Pts wife is calling and says she was reviewing the AVS and noticed this medication was not listed. She is wondering if he is suppose to stop taking this medication or continue it?    Please call back

## 2019-08-22 NOTE — Telephone Encounter (Signed)
*  STAT* If patient is at the pharmacy, call can be transferred to refill team.   1. Which medications need to be refilled? (please list name of each medication and dose if known) potassium chloride SA (KLOR-CON) 20 MEQ tablet  2. Which pharmacy/location (including street and city if local pharmacy) is medication to be sent to? [id number] - [place], [place] - [address]  3. Do they need a 30 day or 90 day supply? [place]

## 2019-08-22 NOTE — Telephone Encounter (Signed)
Chart reviewed and amiodarone was stopped at 05/28/19 office visit. I spoke with patient's wife who reports patient did not stop and has been taking 200 mg daily. Pt will stop taking amiodarone now

## 2019-08-22 NOTE — Telephone Encounter (Signed)
OK thanks  Jake Dunn 

## 2019-08-26 ENCOUNTER — Telehealth: Payer: Self-pay | Admitting: Cardiology

## 2019-08-26 NOTE — Telephone Encounter (Signed)
Spoke to patient's wife.She wanted to know if husband was suppose to be taking potassium.Stated he has not been taking.Advised Dr.Jordan is out of office.I will send message to him for advice.

## 2019-08-26 NOTE — Telephone Encounter (Signed)
Pt c/o medication issue:  1. Name of Medication:  potassium chloride SA (KLOR-CON) 20 MEQ tablet furosemide (LASIX) 20 MG tablet  2. How are you currently taking this medication (dosage and times per day)? lasix one tablet a day, not taking potassium chloride  3. Are you having a reaction (difficulty breathing--STAT)? no  4. What is your medication issue? Patient's wife states he is not taking potassium but it is currently on his list of medications. She also states he is on lasix and would like to know if he is supposed to take both medications.

## 2019-08-27 NOTE — Telephone Encounter (Signed)
Spoke to patient's wife Dr.Jordan's advice given.Stated he has not been taking a potassium supplement.

## 2019-08-27 NOTE — Telephone Encounter (Signed)
His recent lab work showed a normal potassium level so whatever he has been doing he should continue  Medardo Hassing Martinique MD, [facility]

## 2019-09-05 ENCOUNTER — Other Ambulatory Visit: Payer: Self-pay

## 2019-09-05 ENCOUNTER — Ambulatory Visit (HOSPITAL_COMMUNITY): Payer: Medicare HMO | Attending: Internal Medicine

## 2019-09-05 DIAGNOSIS — R0602 Shortness of breath: Secondary | ICD-10-CM | POA: Diagnosis present

## 2019-09-05 DIAGNOSIS — I480 Paroxysmal atrial fibrillation: Secondary | ICD-10-CM | POA: Diagnosis not present

## 2019-09-05 DIAGNOSIS — R5383 Other fatigue: Secondary | ICD-10-CM

## 2019-10-04 ENCOUNTER — Other Ambulatory Visit: Payer: Self-pay | Admitting: Cardiology

## 2019-10-15 ENCOUNTER — Telehealth: Payer: Self-pay | Admitting: *Deleted

## 2019-10-15 NOTE — Telephone Encounter (Signed)
-----   Message from Maryjane Hurter sent at 10/09/2019 10:15 AM EDT ----- Hello,    I am forwarding to you. Was not sure if it would be you or Mariann Laster who would need to setup this sleep study. Please advise.  Thanks, Maryjane Hurter ----- Message ----- From: Luanna Salk, LPN Sent: 6/80/3212   7:11 PM EDT To: Maryjane Hurter   ----- Message ----- From: Gwendel Hanson Sent: 09/27/2019   2:40 PM EDT To: Luanna Salk, LPN  [facility],  Scheduling does not schedule sleep studies. This would need to go to [place]. Thanks ----- Message ----- From: Luanna Salk, LPN Sent: 2/48/2500   2:25 PM EDT To: [facility] Scheduling  Dr.Jordan wants pt to have a sleep study    Thanks

## 2019-10-21 ENCOUNTER — Telehealth: Payer: Self-pay | Admitting: *Deleted

## 2019-10-21 NOTE — Telephone Encounter (Signed)
Left message to call sleep lab to get sleep study appointment details. ( working from home could not leave a call back #)

## 2019-11-05 ENCOUNTER — Other Ambulatory Visit (HOSPITAL_COMMUNITY)
Admission: RE | Admit: 2019-11-05 | Discharge: 2019-11-05 | Disposition: A | Discharge status: Home / Self Care | Payer: Medicare HMO | Source: Ambulatory Visit | Attending: Cardiovascular Disease | Admitting: Cardiovascular Disease

## 2019-11-05 DIAGNOSIS — H52203 Unspecified astigmatism, bilateral: Secondary | ICD-10-CM | POA: Diagnosis not present

## 2019-11-05 DIAGNOSIS — Z20822 Contact with and (suspected) exposure to covid-19: Secondary | ICD-10-CM | POA: Insufficient documentation

## 2019-11-05 DIAGNOSIS — H25013 Cortical age-related cataract, bilateral: Secondary | ICD-10-CM | POA: Diagnosis not present

## 2019-11-05 DIAGNOSIS — Z01812 Encounter for preprocedural laboratory examination: Secondary | ICD-10-CM | POA: Insufficient documentation

## 2019-11-05 DIAGNOSIS — H5203 Hypermetropia, bilateral: Secondary | ICD-10-CM | POA: Diagnosis not present

## 2019-11-05 LAB — SARS CORONAVIRUS 2 (TAT 6-24 HRS): SARS Coronavirus 2: NEGATIVE

## 2019-11-06 DIAGNOSIS — E785 Hyperlipidemia, unspecified: Secondary | ICD-10-CM | POA: Diagnosis not present

## 2019-11-06 DIAGNOSIS — I480 Paroxysmal atrial fibrillation: Secondary | ICD-10-CM | POA: Diagnosis not present

## 2019-11-06 DIAGNOSIS — M170 Bilateral primary osteoarthritis of knee: Secondary | ICD-10-CM | POA: Diagnosis not present

## 2019-11-06 DIAGNOSIS — I10 Essential (primary) hypertension: Secondary | ICD-10-CM | POA: Diagnosis not present

## 2019-11-06 DIAGNOSIS — I25119 Atherosclerotic heart disease of native coronary artery with unspecified angina pectoris: Secondary | ICD-10-CM | POA: Diagnosis not present

## 2019-11-06 DIAGNOSIS — I25118 Atherosclerotic heart disease of native coronary artery with other forms of angina pectoris: Secondary | ICD-10-CM | POA: Diagnosis not present

## 2019-11-07 ENCOUNTER — Other Ambulatory Visit: Payer: Self-pay

## 2019-11-07 ENCOUNTER — Ambulatory Visit (HOSPITAL_BASED_OUTPATIENT_CLINIC_OR_DEPARTMENT_OTHER): Payer: Medicare HMO | Attending: Cardiology | Admitting: Cardiovascular Disease

## 2019-11-07 DIAGNOSIS — I2510 Atherosclerotic heart disease of native coronary artery without angina pectoris: Secondary | ICD-10-CM | POA: Diagnosis not present

## 2019-11-07 DIAGNOSIS — R5383 Other fatigue: Secondary | ICD-10-CM | POA: Insufficient documentation

## 2019-11-07 DIAGNOSIS — G4733 Obstructive sleep apnea (adult) (pediatric): Secondary | ICD-10-CM | POA: Diagnosis not present

## 2019-11-07 DIAGNOSIS — I10 Essential (primary) hypertension: Secondary | ICD-10-CM | POA: Diagnosis not present

## 2019-11-07 DIAGNOSIS — E785 Hyperlipidemia, unspecified: Secondary | ICD-10-CM | POA: Diagnosis not present

## 2019-11-07 DIAGNOSIS — I480 Paroxysmal atrial fibrillation: Secondary | ICD-10-CM | POA: Diagnosis not present

## 2019-11-07 DIAGNOSIS — M170 Bilateral primary osteoarthritis of knee: Secondary | ICD-10-CM | POA: Diagnosis not present

## 2019-11-07 DIAGNOSIS — I25118 Atherosclerotic heart disease of native coronary artery with other forms of angina pectoris: Secondary | ICD-10-CM | POA: Diagnosis not present

## 2019-11-07 DIAGNOSIS — R0602 Shortness of breath: Secondary | ICD-10-CM | POA: Diagnosis not present

## 2019-11-07 DIAGNOSIS — I25119 Atherosclerotic heart disease of native coronary artery with unspecified angina pectoris: Secondary | ICD-10-CM | POA: Diagnosis not present

## 2019-11-21 DIAGNOSIS — E7800 Pure hypercholesterolemia, unspecified: Secondary | ICD-10-CM | POA: Diagnosis not present

## 2019-11-21 DIAGNOSIS — Z951 Presence of aortocoronary bypass graft: Secondary | ICD-10-CM | POA: Diagnosis not present

## 2019-11-21 DIAGNOSIS — I480 Paroxysmal atrial fibrillation: Secondary | ICD-10-CM | POA: Diagnosis not present

## 2019-11-21 DIAGNOSIS — I10 Essential (primary) hypertension: Secondary | ICD-10-CM | POA: Diagnosis not present

## 2019-11-21 DIAGNOSIS — I25118 Atherosclerotic heart disease of native coronary artery with other forms of angina pectoris: Secondary | ICD-10-CM | POA: Diagnosis not present

## 2019-11-21 LAB — BASIC METABOLIC PANEL
BUN/Creatinine Ratio: 17 (ref 10–24)
BUN: 21 mg/dL (ref 8–27)
CO2: 20 mmol/L (ref 20–29)
Calcium: 9.2 mg/dL (ref 8.6–10.2)
Chloride: 105 mmol/L (ref 96–106)
Creatinine, Ser: 1.22 mg/dL (ref 0.76–1.27)
GFR calc Af Amer: 66 mL/min/{1.73_m2} (ref >59)
GFR calc non Af Amer: 57 mL/min/{1.73_m2} — ABNORMAL LOW (ref >59)
Glucose: 108 mg/dL — ABNORMAL HIGH (ref 65–99)
Potassium: 4.9 mmol/L (ref 3.5–5.2)
Sodium: 138 mmol/L (ref 134–144)

## 2019-11-21 LAB — LIPID PANEL
Chol/HDL Ratio: 2.7 ratio (ref 0.0–5.0)
Cholesterol, Total: 120 mg/dL (ref 100–199)
HDL: 44 mg/dL (ref >39)
LDL Chol Calc (NIH): 56 mg/dL (ref 0–99)
Triglycerides: 112 mg/dL (ref 0–149)
VLDL Cholesterol Cal: 20 mg/dL (ref 5–40)

## 2019-11-21 LAB — HEPATIC FUNCTION PANEL
ALT: 9 IU/L (ref 0–44)
AST: 8 IU/L (ref 0–40)
Albumin: 4.3 g/dL (ref 3.7–4.7)
Alkaline Phosphatase: 89 IU/L (ref 48–121)
Bilirubin Total: 0.4 mg/dL (ref 0.0–1.2)
Bilirubin, Direct: 0.14 mg/dL (ref 0.00–0.40)
Total Protein: 6.9 g/dL (ref 6.0–8.5)

## 2019-11-21 NOTE — Progress Notes (Signed)
Cardiology Office Note   Date:  11/25/2019   ID:  Jake Dunn, Jake Dunn 08-02-1940, MRN [id number]  PCP:  Harlan Stains, MD  Cardiologist:   Savion Washam Martinique, MD   Chief Complaint  Patient presents with  . Chest Pain  . Shortness of Breath      History of Present Illness: Jake Dunn is a 79 y.o. male who is seen for follow up CAD s/p CABG. He has a PMH of CAD, GERD, HTN, HLD, renal cell CA s/p left nephrectomy. Cardiac catheterization in 2014 showed nonobstructive CAD with 70% lesion in third diagonal that was treated medically.    He presented with left sided chest pressure on 08/15/2016 while at Urology office. BP was quite high that day. He underwent cardiac catheterization on 08/16/2016, this revealed two-vessel CAD with moderate calcified stenosis in the mid LAD, second diagonal, ramus intermedius, ostial and proximal left circumflex and a severe stenosis of the second PLA branch of the RCA. Overall normal LVEDP and normal LVEF. Recommended medical therapy and outpatient exercise stress Myoview. While the LAD and the diagonal only showed moderate progression compared to the 2010/2014 studies, the left circumflex and intermediate stenosis had progressed. Echocardiogram obtained on 08/17/2016 showed EF 35-57%, grade 2 diastolic dysfunction, severely dilated left atrium, PA peak pressure 30 mmHg. Lexiscan Myoview obtained on 08/23/2016 showed EF 51%, no perfusion defect at stress, overall low risk study.   He was seen in November 2020 with worsening symptoms of chest pain and dyspnea. This led to a cardiac cath showing 3 vessel obstructive CAD. He underwent CABG on 04/02/19 by Dr Kipp Brood. CABG X 4.  LIMA LAD, RSVG PLV, Ramus, and D2  (T graft off the ramus vein graft). Post op course complicated by Afib. He was loaded with amiodarone. On his  visit in January 2021 amiodarone and Eliquis were discontinued.   He did have recent sleep study showing few hyponeic episodes without frank apnea.  Did not meet criteria for CPAP trial. Official report is still pending.   On follow up today he continues to complain of SOB with activity and at rest. Has to gasp at times.  Legs get real weak with walking. just give out. Occ. Sharp chest pain. He is walking regularly and mowing grass.   No edema.  No bleeding noted. No anginal pain like before his CABG.     Past Medical History:  Diagnosis Date  . Adenomatous polyp   . Anxiety   . Arthritis    "knees, ankles, shoulders" (08/15/2016)  . CAD (coronary artery disease)    s/p cath in 2014 showing significant stenosis in the diagonal side branches and in the RV marginal branch. These vessels were small and diffusely diseased. He is managed medically. 08/16/16 Cath, no disease progression  . Chronic lower back pain   . Disc disease, degenerative, cervical   . GERD (gastroesophageal reflux disease)   . Gout   . Hyperlipidemia   . Hypertension   . Osteoarthritis   . Renal cell carcinoma    left kidney removal  . Stroke [facility])    MINI STROKE 15+ YRS AGO, no residual  . Stroke ([place])    "I've had 2 or 3"; denies residual on 08/15/2016  . Syncope and collapse     Past Surgical History:  Procedure Laterality Date  . ANTERIOR CERVICAL DECOMP/DISCECTOMY FUSION  02/2004; 04/2005   [name] 5/14/2012Marland Kitchen [name] 09/20/2010  . APPLICATION OF ROBOTIC ASSISTANCE FOR SPINAL PROCEDURE N/A 01/02/2018  Procedure: APPLICATION OF ROBOTIC ASSISTANCE FOR SPINAL PROCEDURE;  Surgeon: Kristeen Miss, MD;  Location: [place];  Service: Neurosurgery;  Laterality: N/A;  . BACK SURGERY    . CARDIAC CATHETERIZATION  12/14/2008   ef 50-55%. SHOWED SIGNIFICANT STENOSIS AND TO DIAGONAL SIDE BRANCHES AND IN THE RIGHT VENTRICULAR MARGINAL BRANCH. THESE VESSELS WERE SMALL AND DIFFUSELY DISEASED  . CARDIOVASCULAR STRESS TEST  12/10/2009   EF 61%. EKG negative. Mild peri ischemia. Managed medically.   . CARPAL TUNNEL RELEASE Right 11/2005   [name] 09/20/2010  . CARPAL TUNNEL RELEASE  Left 06/2007   [name] 5/132012  . COLONOSCOPY  11/2004   [name] 09/20/2010  . COLONOSCOPY W/ BIOPSIES AND POLYPECTOMY  09/2002   [name] 09/20/2010  . CORONARY ARTERY BYPASS GRAFT N/A 04/02/2019   Procedure: CORONARY ARTERY BYPASS GRAFTING (CABG) using LIMA to LAD; Endoscopically harvested right greater saphenous vein to the PLB, Ramus, and Diag 2.;  Surgeon: Lajuana Matte, MD;  Location: [place];  Service: Open Heart Surgery;  Laterality: N/A;  . INCISION AND DRAINAGE ABSCESS Right 10/18/2016   Procedure: INCISION AND DRAINAGE ABSCESS RIGHT SHOULDER;  Surgeon: [name], MD;  Location: [place];  Service: Orthopedics;  Laterality: Right;  . INCISION AND DRAINAGE OF WOUND Right 08/2005   shoulder/notes 09/20/2010  . IR FLUORO GUIDE CV LINE LEFT  10/19/2016  . IR US GUIDE VASC ACCESS LEFT  10/19/2016  . IRRIGATION AND DEBRIDEMENT SHOULDER Right 12/22/2016   Procedure: IRRIGATION AND DEBRIDEMENT RIGHT SHOULDER;  Surgeon: [name], MD;  Location: [place];  Service: Orthopedics;  Laterality: Right;  . JOINT REPLACEMENT    . LEFT HEART CATH AND CORONARY ANGIOGRAPHY N/A 08/16/2016   Procedure: Left Heart Cath and Coronary Angiography;  Surgeon: [name], MD;  Location: [place] CV LAB;  Service: Cardiovascular;  Laterality: N/A;  . LEFT HEART CATH AND CORONARY ANGIOGRAPHY N/A 03/26/2019   Procedure: LEFT HEART CATH AND CORONARY ANGIOGRAPHY;  Surgeon: Martinique, Sufyaan Palma M, MD;  Location: [place] CV LAB;  Service: Cardiovascular;  Laterality: N/A;  . LEFT HEART CATHETERIZATION WITH CORONARY ANGIOGRAM N/A 08/09/2012   Procedure: LEFT HEART CATHETERIZATION WITH CORONARY ANGIOGRAM;  Surgeon: Kyrene Longan M Martinique, MD;  Location: [facility] CATH LAB;  Service: Cardiovascular;  Laterality: N/A;  . LUMBAR FUSION    . NEPHRECTOMY Left early 2000s  . REPLACEMENT TOTAL KNEE Right 08/2008   [name] 09/07/2010  . SHOULDER ARTHROSCOPY WITH ROTATOR CUFF REPAIR Right 06/2005   [name] 09/20/2010   . TOTAL KNEE ARTHROPLASTY  06/08/2011   Procedure: TOTAL KNEE ARTHROPLASTY;  Surgeon: [name], MD;  Location: [place];  Service: Orthopedics;  Laterality: Left;  . TOTAL SHOULDER ARTHROPLASTY Right 04/06/2016   Procedure: RIGHT REVERSE TOTAL SHOULDER ARTHROPLASTY;  Surgeon: [name], MD;  Location: [place];  Service: Orthopedics;  Laterality: Right;  . TOTAL SHOULDER REVISION Right 12/22/2016   Procedure: RIGHT SHOULDER GLENOID AND HUMERAL COMPONENT REVISION;  Surgeon: [name], MD;  Location: [place];  Service: Orthopedics;  Laterality: Right;  . US ECHOCARDIOGRAPHY  12/15/2008   EF 60-65%  . US ECHOCARDIOGRAPHY  09/28/2004   EF 55-60%     Current Outpatient Medications  Medication Sig Dispense Refill  . acyclovir (ZOVIRAX) 400 MG tablet Take 400 mg by mouth 3 (three) times daily as needed (For cold sores). .  5  . allopurinol (ZYLOPRIM) 300 MG tablet Take 300 mg by mouth daily as needed (for gout flares).     Marland Kitchen  aspirin EC 81 MG tablet Take 1 tablet (81 mg total) by mouth daily. 90 tablet 3  . atorvastatin (LIPITOR) 80 MG tablet Take 1 tablet (80 mg total) by mouth every evening. 30 tablet 6  . furosemide (LASIX) 40 MG tablet Take 1 tablet (40 mg total) by mouth daily. For one week;then take 20 mg daily thereafter 30 tablet 1  . LORazepam (ATIVAN) 0.5 MG tablet Take 0.5 mg by mouth 2 (two) times daily as needed for anxiety.   2  . metoprolol tartrate (LOPRESSOR) 25 MG tablet Take 25 mg by mouth 2 (two) times daily.    Marland Kitchen oxyCODONE (OXY IR/ROXICODONE) 5 MG immediate release tablet Take 1 tablet (5 mg total) by mouth every 6 (six) hours as needed for severe pain. 28 tablet 0  . pantoprazole (PROTONIX) 40 MG tablet Take 40 mg by mouth every evening.     . potassium chloride SA (KLOR-CON) 20 MEQ tablet Take 1 tablet (20 mEq total) by mouth daily. For one week then stop 7 tablet 0   Current Facility-Administered Medications  Medication Dose Route Frequency Provider Last Rate Last  Admin  . sodium chloride flush (NS) 0.9 % injection 3 mL  3 mL Intravenous Q12H Martinique, Warrick Llera M, MD        Allergies:   Adhesive [tape] and Latex    Social History:  The patient  reports that he quit smoking about 30 years ago. He has never used smokeless tobacco. He reports current alcohol use. He reports that he does not use drugs.   Family History:  The patient's family history includes Heart attack in his father; Heart disease in his brother.    ROS:  Please see the history of present illness.   Otherwise, review of systems are positive for none.   All other systems are reviewed and negative.    PHYSICAL EXAM: VS:  BP 130/80   Pulse 73   Ht 5\' 7"  (1.702 m)   Wt 236 lb (107 kg)   SpO2 99%   BMI 36.96 kg/m  , BMI Body mass index is 36.96 kg/m. GEN: Well nourished, well developed, in no acute distress  HEENT: normal  Neck: no JVD, carotid bruits, or masses Cardiac: RRR; no murmurs, rubs, or gallops,no edema. Sternal incision is healing well.  Respiratory:  clear to auscultation bilaterally, normal work of breathing GI: soft, nontender, nondistended, + BS MS: no deformity or atrophy  Skin: warm and dry, no rash Neuro:  Strength and sensation are intact Psych: euthymic mood, full affect   EKG:  EKG is  ordered today. Sinus brady rate 56. Normal. I have personally reviewed and interpreted this study.      Recent Labs: 03/29/2019: B Natriuretic Peptide 50.2 04/03/2019: Magnesium 2.2 08/20/2019: Hemoglobin 12.2; Platelets 261; TSH 2.030 11/21/2019: ALT 9; BUN 21; Creatinine, Ser 1.22; Potassium 4.9; Sodium 138    Lipid Panel    Component Value Date/Time   CHOL 120 11/21/2019 0953   TRIG 112 11/21/2019 0953   HDL 44 11/21/2019 0953   CHOLHDL 2.7 11/21/2019 0953   CHOLHDL 3.4 03/30/2019 0212   VLDL 35 03/30/2019 0212   LDLCALC 56 11/21/2019 0953    Dated 01/25/19: cholesterol 117, triglycerides 125, HDL 39, LDL 53. CMET, CBC, TSH normal  Wt Readings from Last 3  Encounters:  11/25/19 236 lb (107 kg)  11/07/19 231 lb (104.8 kg)  08/20/19 239 lb 12.8 oz (108.8 kg)    Ecg today shows NSR with rate 73.  Normal study. I have personally reviewed and interpreted this study.   Other studies Reviewed: Additional studies/ records that were reviewed today include:   Cath 03/26/19: Procedures  LEFT HEART CATH AND CORONARY ANGIOGRAPHY  Conclusion    Prox RCA lesion is 45% stenosed.  2nd RPL lesion is 80% stenosed.  Ost LM to Mid LM lesion is 50% stenosed.  Ost Cx to Mid Cx lesion is 80% stenosed.  Ramus lesion is 90% stenosed.  Mid LAD lesion is 70% stenosed.  2nd Diag lesion is 60% stenosed.  The left ventricular systolic function is normal.  LV end diastolic pressure is normal.  The left ventricular ejection fraction is 55-65% by visual estimate.   1. Three vessel obstructive, Calcific CAD 2. Normal LV function 3. Normal LVEDP  Plan: recommend referral to CT surgery for CABG. Will stop Plavix.    Echo 03/30/19: IMPRESSIONS    1. Left ventricular ejection fraction, by visual estimation, is 55 to 60%. The left ventricle has normal function. There is no left ventricular hypertrophy.  2. Global right ventricle has mildly reduced systolic function.The right ventricular size is mildly enlarged. No increase in right ventricular wall thickness.  3. Left atrial size was normal.  4. Right atrial size was mildly dilated.  5. The mitral valve is normal in structure. No evidence of mitral valve regurgitation.  6. The tricuspid valve is normal in structure. Tricuspid valve regurgitation is trivial.  7. The aortic valve is normal in structure. Aortic valve regurgitation is not visualized. No evidence of aortic valve sclerosis or stenosis.  8. The pulmonic valve was grossly normal. Pulmonic valve regurgitation is not visualized.  9. Aortic dilatation noted. 10. There is mild dilatation of the ascending aorta measuring 38 mm. 11. Normal  pulmonary artery systolic pressure. 12. The atrial septum is grossly normal.   ASSESSMENT AND PLAN:  1. CAD  Now s/p CABG x 4 on 04/02/19: denies any anginal symptoms.  No edema on exam.  Continue ASA,  high dose statin and metoprolol.   2. Atrial fibrillation post op CABG.  Maintaining NSR. Off Eliquis and amiodarone since April.   3. HTN: Blood pressure is under excellent control now. Was on multiple meds prior to surgery. Now only on low dose metoprolol and lasix.  4. HLD: Continue on Lipitor 80 mg daily. LDL at goal    5. History of CVA on ASA and Plavix pre op. Now on ASA  Only.     6. Cervical/lumbar spine disease. Followed by Dr. Ellene Route.     7. Chronic diastolic CHF. Volume status looks good on exam. On lasix 40 mg daily. Recent sleep study done- does not appear to have significant sleep apnea.    8. SOB. Etiology unclear. I do not see a cardiac cause for these ongoing symptoms. His Hgb is normal. Sleep study without apnea. Offered him option of seeing a pulmonary specialist but he would like to wait for now. Will follow up again in December.   Current medicines are reviewed at length with the patient today.  The patient does not have concerns regarding medicines.  The following changes have been made:  See above  Labs/ tests ordered today include:   No orders of the defined types were placed in this encounter.    Disposition:   FU 4-5 months  Signed, Esmae Donathan Martinique, MD  11/25/2019 8:22 AM    [facility] [address], [place], [place], [address] Phone [phone], Fax [phone]

## 2019-11-25 ENCOUNTER — Other Ambulatory Visit: Payer: Self-pay

## 2019-11-25 ENCOUNTER — Ambulatory Visit: Payer: Medicare HMO | Admitting: Cardiology

## 2019-11-25 ENCOUNTER — Encounter: Payer: Self-pay | Admitting: Cardiology

## 2019-11-25 VITALS — BP 130/80 | HR 73 | Ht 67.0 in | Wt 236.0 lb

## 2019-11-25 DIAGNOSIS — I5032 Chronic diastolic (congestive) heart failure: Secondary | ICD-10-CM

## 2019-11-25 DIAGNOSIS — R0602 Shortness of breath: Secondary | ICD-10-CM

## 2019-11-25 DIAGNOSIS — Z951 Presence of aortocoronary bypass graft: Secondary | ICD-10-CM

## 2019-11-25 DIAGNOSIS — I480 Paroxysmal atrial fibrillation: Secondary | ICD-10-CM

## 2019-11-25 DIAGNOSIS — I25118 Atherosclerotic heart disease of native coronary artery with other forms of angina pectoris: Secondary | ICD-10-CM

## 2019-11-25 DIAGNOSIS — I10 Essential (primary) hypertension: Secondary | ICD-10-CM | POA: Diagnosis not present

## 2019-11-25 DIAGNOSIS — E7800 Pure hypercholesterolemia, unspecified: Secondary | ICD-10-CM | POA: Diagnosis not present

## 2019-11-29 NOTE — Procedures (Signed)
Patient Name: Joseph, Bias Date: 11/07/2019 Gender: Male D.O.B: 1941-01-12 Age (years): 79 Referring Provider: Peter M Martinique Height (inches): 68 Interpreting Physician: Shelva Majestic MD, ABSM Weight (lbs): 231 RPSGT: Heugly, [place] BMI: 35 MRN: [id number] Neck Size: 18.00  CLINICAL INFORMATION Sleep Study Type: NPSG  Indication for sleep study: Fatigue, Hypertension, Snoring  Epworth Sleepiness Score: 5  SLEEP STUDY TECHNIQUE As per the AASM Manual for the Scoring of Sleep and Associated Events v2.3 (April 2016) with a hypopnea requiring 4% desaturations.  The channels recorded and monitored were frontal, central and occipital EEG, electrooculogram (EOG), submentalis EMG (chin), nasal and oral airflow, thoracic and abdominal wall motion, anterior tibialis EMG, snore microphone, electrocardiogram, and pulse oximetry.  MEDICATIONS acyclovir (ZOVIRAX) 400 MG tablet allopurinol (ZYLOPRIM) 300 MG tablet aspirin EC 81 MG tablet atorvastatin (LIPITOR) 80 MG tablet furosemide (LASIX) 40 MG tablet LORazepam (ATIVAN) 0.5 MG tablet metoprolol tartrate (LOPRESSOR) 25 MG tablet oxyCODONE (OXY IR/ROXICODONE) 5 MG immediate release tablet pantoprazole (PROTONIX) 40 MG tablet potassium chloride SA (KLOR-CON) 20 MEQ tablet   SLEEP ARCHITECTURE The study was initiated at 9:32:23 PM and ended at 3:39:31 AM.  Sleep onset time was 29.6 minutes and the sleep efficiency was 56.2%%. The total sleep time was 206.5 minutes.  Stage REM latency was 109.0 minutes.  The patient spent 35.6%% of the night in stage N1 sleep, 49.6%% in stage N2 sleep, 0.0%% in stage N3 and 14.8% in REM.  Alpha intrusion was absent.  Supine sleep was 34.53%.  RESPIRATORY PARAMETERS The overall apnea/hypopnea index (AHI) was 10.5 per hour. The respiratory disturbance index (RDI) was 21.5/h.There were 8 total apneas, including 8 obstructive, 0 central and 0 mixed apneas. There were 28 hypopneas  and 38 RERAs.  The AHI during Stage REM sleep was 47.2 per hour.  AHI while supine was 6.7 per hour.  The mean oxygen saturation was 94.7%. The minimum SpO2 during sleep was 83.0%.  Moderate snoring was noted during this study.  CARDIAC DATA The 2 lead EKG demonstrated sinus rhythm. The mean heart rate was 56.9 beats per minute. Other EKG findings include: None.  LEG MOVEMENT DATA The total PLMS were 0 with a resulting PLMS index of 0.0. Associated arousal with leg movement index was 38.9 .  IMPRESSIONS - Mild obstructive sleep apnea overall (AHI10.5/h, RDI 21.5/h); however, sleep apnea was severe during REM sleep. - No significant central sleep apnea occurred during this study (CAI = 0.0/h). - Moderate oxygen desaturation to a nadir of 83.0%. - The patient snored with moderate snoring volume. - Reduced sleep efficiency 56.2%. - No cardiac abnormalities were noted during this study. - Clinically significant periodic limb movements did not occur during sleep. Associated arousals were significant.  DIAGNOSIS - Obstructive Sleep Apnea (G47.33) - Periodic Limb Movement During Sleep (G47.61) - Nocturnal Hypoxemia (G47.36)  RECOMMENDATIONS - Therapeutic CPAP titration to determine optimal pressure required to alleviate sleep disordered breathing. - Effort should be made to optimize nasal and oropharyngeal patency. - If patient is symptomatic with restless legs after CPAP therapy consider a trial of pharmacotherapy. - Avoid alcohol, sedatives and other CNS depressants that may worsen sleep apnea and disrupt normal sleep architecture. - Sleep hygiene should be reviewed to assess factors that may improve sleep quality. - Weight management (BMI 35) and regular exercise should be initiated or continued if appropriate.  [Electronically signed] 11/29/2019 12:25 PM  Shelva Majestic MD, [facility], ABSM Diplomate, American Board of Sleep Medicine   NPI: [id number]  [place] PH: [phone]   FX: [phone] [place]

## 2019-12-02 DIAGNOSIS — H25013 Cortical age-related cataract, bilateral: Secondary | ICD-10-CM | POA: Diagnosis not present

## 2019-12-02 DIAGNOSIS — H2513 Age-related nuclear cataract, bilateral: Secondary | ICD-10-CM | POA: Diagnosis not present

## 2019-12-02 DIAGNOSIS — H25043 Posterior subcapsular polar age-related cataract, bilateral: Secondary | ICD-10-CM | POA: Diagnosis not present

## 2019-12-10 ENCOUNTER — Telehealth: Payer: Self-pay | Admitting: *Deleted

## 2019-12-10 NOTE — Telephone Encounter (Signed)
Left message I was calling to notify of sleep sleep study results. Will return a call later.

## 2019-12-31 DIAGNOSIS — H25011 Cortical age-related cataract, right eye: Secondary | ICD-10-CM | POA: Diagnosis not present

## 2019-12-31 DIAGNOSIS — H25812 Combined forms of age-related cataract, left eye: Secondary | ICD-10-CM | POA: Diagnosis not present

## 2019-12-31 DIAGNOSIS — H2511 Age-related nuclear cataract, right eye: Secondary | ICD-10-CM | POA: Diagnosis not present

## 2019-12-31 DIAGNOSIS — H25811 Combined forms of age-related cataract, right eye: Secondary | ICD-10-CM | POA: Diagnosis not present

## 2019-12-31 DIAGNOSIS — H25041 Posterior subcapsular polar age-related cataract, right eye: Secondary | ICD-10-CM | POA: Diagnosis not present

## 2020-01-08 DIAGNOSIS — R69 Illness, unspecified: Secondary | ICD-10-CM | POA: Diagnosis not present

## 2020-01-27 DIAGNOSIS — G894 Chronic pain syndrome: Secondary | ICD-10-CM | POA: Diagnosis not present

## 2020-01-27 DIAGNOSIS — E785 Hyperlipidemia, unspecified: Secondary | ICD-10-CM | POA: Diagnosis not present

## 2020-01-27 DIAGNOSIS — Z0000 Encounter for general adult medical examination without abnormal findings: Secondary | ICD-10-CM | POA: Diagnosis not present

## 2020-01-27 DIAGNOSIS — I2510 Atherosclerotic heart disease of native coronary artery without angina pectoris: Secondary | ICD-10-CM | POA: Diagnosis not present

## 2020-01-27 DIAGNOSIS — M109 Gout, unspecified: Secondary | ICD-10-CM | POA: Diagnosis not present

## 2020-01-27 DIAGNOSIS — I480 Paroxysmal atrial fibrillation: Secondary | ICD-10-CM | POA: Diagnosis not present

## 2020-01-27 DIAGNOSIS — I700 Atherosclerosis of aorta: Secondary | ICD-10-CM | POA: Diagnosis not present

## 2020-01-27 DIAGNOSIS — Z23 Encounter for immunization: Secondary | ICD-10-CM | POA: Diagnosis not present

## 2020-01-27 DIAGNOSIS — R69 Illness, unspecified: Secondary | ICD-10-CM | POA: Diagnosis not present

## 2020-01-27 DIAGNOSIS — I10 Essential (primary) hypertension: Secondary | ICD-10-CM | POA: Diagnosis not present

## 2020-02-04 DIAGNOSIS — H25012 Cortical age-related cataract, left eye: Secondary | ICD-10-CM | POA: Diagnosis not present

## 2020-02-04 DIAGNOSIS — H2512 Age-related nuclear cataract, left eye: Secondary | ICD-10-CM | POA: Diagnosis not present

## 2020-02-04 DIAGNOSIS — H25042 Posterior subcapsular polar age-related cataract, left eye: Secondary | ICD-10-CM | POA: Diagnosis not present

## 2020-02-04 DIAGNOSIS — H25812 Combined forms of age-related cataract, left eye: Secondary | ICD-10-CM | POA: Diagnosis not present

## 2020-02-11 ENCOUNTER — Ambulatory Visit: Payer: Medicare HMO | Attending: Internal Medicine

## 2020-02-11 DIAGNOSIS — Z23 Encounter for immunization: Secondary | ICD-10-CM

## 2020-02-11 NOTE — Progress Notes (Signed)
   Covid-19 Vaccination Clinic  Name:  DONTELL MIAN    MRN: [id number] DOB: June 10, 1940  02/11/2020  Mr. Strike was observed post Covid-19 immunization for 15 minutes without incident. He was provided with Vaccine Information Sheet and instruction to access the V-Safe system.   Mr. Rekowski was instructed to call 911 with any severe reactions post vaccine: Marland Kitchen Difficulty breathing  . Swelling of face and throat  . A fast heartbeat  . A bad rash all over body  . Dizziness and weakness

## 2020-03-04 DIAGNOSIS — M21371 Foot drop, right foot: Secondary | ICD-10-CM | POA: Diagnosis not present

## 2020-03-04 DIAGNOSIS — Z6836 Body mass index (BMI) 36.0-36.9, adult: Secondary | ICD-10-CM | POA: Diagnosis not present

## 2020-03-04 DIAGNOSIS — I10 Essential (primary) hypertension: Secondary | ICD-10-CM | POA: Diagnosis not present

## 2020-03-04 DIAGNOSIS — S32009K Unspecified fracture of unspecified lumbar vertebra, subsequent encounter for fracture with nonunion: Secondary | ICD-10-CM | POA: Diagnosis not present

## 2020-03-05 DIAGNOSIS — H35352 Cystoid macular degeneration, left eye: Secondary | ICD-10-CM | POA: Diagnosis not present

## 2020-03-09 ENCOUNTER — Other Ambulatory Visit (HOSPITAL_COMMUNITY): Payer: Self-pay | Admitting: Neurological Surgery

## 2020-03-09 ENCOUNTER — Other Ambulatory Visit: Payer: Self-pay | Admitting: Neurological Surgery

## 2020-03-09 DIAGNOSIS — S32009K Unspecified fracture of unspecified lumbar vertebra, subsequent encounter for fracture with nonunion: Secondary | ICD-10-CM

## 2020-03-12 DIAGNOSIS — I480 Paroxysmal atrial fibrillation: Secondary | ICD-10-CM | POA: Diagnosis not present

## 2020-03-12 DIAGNOSIS — I10 Essential (primary) hypertension: Secondary | ICD-10-CM | POA: Diagnosis not present

## 2020-03-12 DIAGNOSIS — I25119 Atherosclerotic heart disease of native coronary artery with unspecified angina pectoris: Secondary | ICD-10-CM | POA: Diagnosis not present

## 2020-03-12 DIAGNOSIS — I2510 Atherosclerotic heart disease of native coronary artery without angina pectoris: Secondary | ICD-10-CM | POA: Diagnosis not present

## 2020-03-12 DIAGNOSIS — K219 Gastro-esophageal reflux disease without esophagitis: Secondary | ICD-10-CM | POA: Diagnosis not present

## 2020-03-12 DIAGNOSIS — E785 Hyperlipidemia, unspecified: Secondary | ICD-10-CM | POA: Diagnosis not present

## 2020-03-12 DIAGNOSIS — M170 Bilateral primary osteoarthritis of knee: Secondary | ICD-10-CM | POA: Diagnosis not present

## 2020-03-12 DIAGNOSIS — I25118 Atherosclerotic heart disease of native coronary artery with other forms of angina pectoris: Secondary | ICD-10-CM | POA: Diagnosis not present

## 2020-03-13 ENCOUNTER — Telehealth: Payer: Self-pay | Admitting: *Deleted

## 2020-03-13 NOTE — Telephone Encounter (Signed)
Informed patient of sleep study results and patient understanding was verbalized. Patient understands his sleep study showed  IMPRESSIONS - Mild obstructive sleep apnea overall (AHI10.5/h, RDI 21.5/h); however, sleep apnea was severe during REM sleep. - Moderate oxygen desaturation to a nadir of 83.0%. - The patient snored with moderate snoring volume - No cardiac abnormalities were noted during this study.  DIAGNOSIS - Obstructive Sleep Apnea (G47.33) - Periodic Limb Movement During Sleep (G47.61) - Nocturnal Hypoxemia (G47.36)  RECOMMENDATIONS - Therapeutic CPAP titration to determine optimal pressure required to alleviate sleep disordered breathing  Patient has declined to proceed with the cpap titration. Patient states he spoke with dr Martinique his cardiologist and he told him he was ok. He was not happy about getting his results in an untimely manner. I apologized to him but it was clearly not enough. Patient says he was not going back to the lab.

## 2020-03-13 NOTE — Telephone Encounter (Signed)
-----   Message from Troy Sine, MD sent at 11/29/2019 12:31 PM EDT ----- Mariann Laster, please notify pt and set up for CPAP titration study

## 2020-03-30 ENCOUNTER — Ambulatory Visit (HOSPITAL_COMMUNITY)
Admission: RE | Admit: 2020-03-30 | Discharge: 2020-03-30 | Disposition: A | Discharge status: Home / Self Care | Payer: Medicare HMO | Source: Ambulatory Visit | Attending: Neurological Surgery | Admitting: Neurological Surgery

## 2020-03-30 ENCOUNTER — Other Ambulatory Visit: Payer: Self-pay

## 2020-03-30 DIAGNOSIS — M5116 Intervertebral disc disorders with radiculopathy, lumbar region: Secondary | ICD-10-CM | POA: Diagnosis not present

## 2020-03-30 DIAGNOSIS — S32009K Unspecified fracture of unspecified lumbar vertebra, subsequent encounter for fracture with nonunion: Secondary | ICD-10-CM

## 2020-03-30 DIAGNOSIS — Z981 Arthrodesis status: Secondary | ICD-10-CM | POA: Insufficient documentation

## 2020-03-30 DIAGNOSIS — I700 Atherosclerosis of aorta: Secondary | ICD-10-CM | POA: Insufficient documentation

## 2020-03-30 DIAGNOSIS — M5416 Radiculopathy, lumbar region: Secondary | ICD-10-CM | POA: Diagnosis present

## 2020-03-30 DIAGNOSIS — M48061 Spinal stenosis, lumbar region without neurogenic claudication: Secondary | ICD-10-CM | POA: Diagnosis not present

## 2020-03-30 DIAGNOSIS — M21371 Foot drop, right foot: Secondary | ICD-10-CM | POA: Diagnosis not present

## 2020-03-30 MED ORDER — LIDOCAINE HCL (PF) 1 % IJ SOLN
5.0000 mL | Freq: Once | INTRAMUSCULAR | Status: AC
  Administered 2020-03-30: 3 mL

## 2020-03-30 MED ORDER — OXYCODONE-ACETAMINOPHEN 5-325 MG PO TABS: 1.0000 | ORAL_TABLET | ORAL | Status: DC | PRN

## 2020-03-30 MED ORDER — OXYCODONE-ACETAMINOPHEN 5-325 MG PO TABS
ORAL_TABLET | ORAL | Status: AC
  Administered 2020-03-30: 1 via ORAL
  Filled 2020-03-30: qty 1

## 2020-03-30 MED ORDER — IOHEXOL 180 MG/ML  SOLN
20.0000 mL | Freq: Once | INTRAMUSCULAR | Status: AC | PRN
  Administered 2020-03-30: 12 mL via INTRATHECAL

## 2020-03-30 MED ORDER — ONDANSETRON HCL 4 MG/2ML IJ SOLN: 4.0000 mg | Freq: Four times a day (QID) | INTRAMUSCULAR | Status: DC | PRN

## 2020-03-30 MED ORDER — DIAZEPAM 5 MG PO TABS
ORAL_TABLET | ORAL | Status: AC
  Administered 2020-03-30: 10 mg via ORAL
  Filled 2020-03-30: qty 2

## 2020-03-30 MED ORDER — DIAZEPAM 5 MG PO TABS
10.0000 mg | ORAL_TABLET | Freq: Once | ORAL | Status: AC
  Filled 2020-03-30: qty 2

## 2020-03-30 NOTE — Progress Notes (Signed)
Patient was given discharge instructions. He verbalized understanding. 

## 2020-03-30 NOTE — Procedures (Signed)
[name] is a 79 year old individual who has had a number of lumbar fusions over the past 25 years.  Has had increasing back pain and bilateral lumbar radiculopathy with symptoms of neurogenic claudication.  A myelogram is now being performed because of the presence of significant metallic artifact from his previous hardware to further evaluate the problem with lumbar radiculopathy in the spine.  Pre op Dx: Chronic lumbar radiculopathy, neurogenic claudication, history of lumbar fusion Post op Dx: Same Procedure: Lumbar myelogram Surgeon: Clorene Nerio Puncture level: L2-3 Fluid color: Clear colorless Injection: Isovue 180, 12 mL Findings: Moderate spondylitic disease throughout the lumbar spine further evaluation with CT scanning.

## 2020-03-30 NOTE — Discharge Instructions (Signed)
Myelogram  A myelogram is an imaging test. This test checks for problems in the spinal cord and the places where nerves attach to the spinal cord (nerve roots). A dye (contrast material) is put into your spine before the X-ray. This provides a clearer image for your doctor to see. You may need this test if you have a spinal cord problem that cannot be diagnosed with other imaging tests. You may also have this test to check your spine after surgery. Tell a doctor about:  Any allergies you have, especially to iodine.  All medicines you are taking, including vitamins, herbs, eye drops, creams, and over-the-counter medicines.  Any problems you or family members have had with anesthetic medicines or dye.  Any blood disorders you have.  Any surgeries you have had.  Any medical conditions you have or have had, including asthma.  Whether you are pregnant or may be pregnant. What are the risks? Generally, this is a safe procedure. However, problems may occur, including:  Infection.  Bleeding.  Allergic reaction to medicines or dyes.  Damage to your spinal cord or nerves.  Leaking of spinal fluid. This can cause a headache.  Damage to kidneys.  Seizures. This is rare. What happens before the procedure?  Follow instructions from your doctor about what you cannot eat or drink. You may be asked to drink more fluids.  Ask your doctor about changing or stopping your normal medicines. This is important if you take diabetes medicines or blood thinners.  Plan to have someone take you home from the hospital or clinic.  If you will be going home right after the procedure, plan to have someone with you for 24 hours. What happens during the procedure?  You will lie face down on a table.  Your doctor will find the best injection site on your spine. This is most often in the lower back.  This area will be washed with soap.  You will be given a medicine to numb the area (local  anesthetic).  Your doctor will place a long needle into the space around your spinal cord.  A sample of spinal fluid may be taken. This may be sent to the lab for testing.  The dye will be injected into the space around your spinal cord.  The exam table may be tilted. This helps the dye flow up or down your spine.  The X-ray will take images of your spinal cord.  A bandage (dressing) may be placed over the area where the dye was injected. The procedure may vary among doctors and hospitals. What can I expect after the procedure?  You may be monitored until you leave the hospital or clinic. This includes checking your blood pressure, heart rate, breathing rate, and blood oxygen level.  You may feel sore at the injection site. You may have a mild headache.  You will be told to lie flat with your head raised (elevated). This lowers the risk of a headache.  It is up to you to get the results of your procedure. Ask your doctor, or the department that is doing the procedure, when your results will be ready. Follow these instructions at home:   Rest as told by your doctor. Lie flat with your head slightly elevated.  Do not bend, lift, or do hard work for 24-48 hours, or as told by your doctor.  Take over-the-counter and prescription medicines only as told by your doctor.  Take care of your bandage as told by your  doctor.  Drink enough fluid to keep your pee (urine) pale yellow.  Bathe or shower as told by your doctor. Contact a doctor if:  You have a fever.  You have a headache that lasts longer than 24 hours.  You feel sick to your stomach (nauseous).  You vomit.  Your neck is stiff.  Your legs feel numb.  You cannot pee.  You cannot poop (no bowel movement).  You have a rash.  You are itchy or sneezing. Get help right away if:  You have new symptoms or your symptoms get worse.  You have a seizure.  You have trouble breathing. Summary  A myelogram is an  imaging test that checks for problems in the spinal cord and the places where nerves attach to the spinal cord (nerve roots).  Before the procedure, follow instructions from your doctor. You will be told what not to eat or drink, or what medicines to change or stop.  After the procedure, you will be told to lie flat with your head raised (elevated). This will lower your risk of a headache.  Do not bend, lift, or do any hard work for 24-48 hours, or as told by your doctor.  Contact a doctor if you have a stiff neck or numb legs. Get help right away if your symptoms get worse, or you have a seizure or trouble breathing. This information is not intended to replace advice given to you by your health care provider. Make sure you discuss any questions you have with your health care provider. Document Revised: 07/04/2018 Document Reviewed: 07/05/2018 Elsevier Patient Education  [place].

## 2020-04-01 DIAGNOSIS — M48062 Spinal stenosis, lumbar region with neurogenic claudication: Secondary | ICD-10-CM | POA: Diagnosis not present

## 2020-04-06 DIAGNOSIS — H35352 Cystoid macular degeneration, left eye: Secondary | ICD-10-CM | POA: Diagnosis not present

## 2020-04-09 ENCOUNTER — Other Ambulatory Visit: Payer: Self-pay | Admitting: Thoracic Surgery (Cardiothoracic Vascular Surgery)

## 2020-04-09 DIAGNOSIS — Z951 Presence of aortocoronary bypass graft: Secondary | ICD-10-CM

## 2020-04-09 DIAGNOSIS — I10 Essential (primary) hypertension: Secondary | ICD-10-CM | POA: Diagnosis not present

## 2020-04-09 DIAGNOSIS — M109 Gout, unspecified: Secondary | ICD-10-CM | POA: Diagnosis not present

## 2020-04-09 DIAGNOSIS — E785 Hyperlipidemia, unspecified: Secondary | ICD-10-CM | POA: Diagnosis not present

## 2020-04-16 DIAGNOSIS — M5414 Radiculopathy, thoracic region: Secondary | ICD-10-CM | POA: Diagnosis not present

## 2020-04-16 DIAGNOSIS — M5114 Intervertebral disc disorders with radiculopathy, thoracic region: Secondary | ICD-10-CM | POA: Diagnosis not present

## 2020-04-20 ENCOUNTER — Other Ambulatory Visit: Payer: Self-pay | Admitting: Cardiology

## 2020-05-07 NOTE — Progress Notes (Signed)
Cardiology Office Note   Date:  05/13/2020   ID:  Jake Dunn, Jake Dunn 08/18/1940, MRN [id number]  PCP:  Laurann Montana, MD  Cardiologist:   Dionisios Ricci [place], MD   Chief Complaint  Patient presents with  . Follow-up  . Chest Pain  . Shortness of Breath      History of Present Illness: Jake Dunn is a 79 y.o. male who is seen for follow up CAD s/p CABG. He has a PMH of CAD, GERD, HTN, HLD, renal cell CA s/p left nephrectomy. Cardiac catheterization in 2014 showed nonobstructive CAD with 70% lesion in third diagonal that was treated medically.    He presented with left sided chest pressure on 08/15/2016. He underwent cardiac catheterization on 08/16/2016, this revealed two-vessel CAD with moderate calcified stenosis in the mid LAD, second diagonal, ramus intermedius, ostial and proximal left circumflex and a severe stenosis of the second PLA branch of the RCA. Overall normal LVEDP and normal LVEF. Recommended medical therapy and outpatient exercise stress Myoview. While the LAD and the diagonal only showed moderate progression compared to the 2010/2014 studies, the left circumflex and intermediate stenosis had progressed. Echocardiogram obtained on 08/17/2016 showed EF 55-60%, grade 2 diastolic dysfunction, severely dilated left atrium, PA peak pressure 30 mmHg. Lexiscan Myoview obtained on 08/23/2016 showed EF 51%, no perfusion defect at stress, overall low risk study.   He was seen in November 2020 with symptoms of chest pain and dyspnea. This led to a cardiac cath showing 3 vessel obstructive CAD. He underwent CABG on 04/02/19 by Dr Cliffton Asters. CABG X 4.  LIMA LAD, RSVG PLV, Ramus, and D2  (T graft off the ramus vein graft). Post op course complicated by Afib. He was loaded with amiodarone. On his  visit in January 2021 amiodarone and Eliquis were discontinued.   He did have a sleep study showing few hyponeic episodes without frank apnea. Did not meet criteria for CPAP trial.   On follow  up today he notes his SOB has improved significantly. He does report intermittent chest pain that will shoot through his chest. Typically goes away quickly. Not always associated with activity. Does feel somewhat like symptoms he had prior to bypass.   He is walking regularly and doing yard work.   No edema.     Past Medical History:  Diagnosis Date  . Adenomatous polyp   . Anxiety   . Arthritis    "knees, ankles, shoulders" (08/15/2016)  . CAD (coronary artery disease)    s/p cath in 2014 showing significant stenosis in the diagonal side branches and in the RV marginal branch. These vessels were small and diffusely diseased. He is managed medically. 08/16/16 Cath, no disease progression  . Chronic lower back pain   . Disc disease, degenerative, cervical   . GERD (gastroesophageal reflux disease)   . Gout   . Hyperlipidemia   . Hypertension   . Osteoarthritis   . Renal cell carcinoma    left kidney removal  . Stroke [facility])    MINI STROKE 15+ YRS AGO, no residual  . Stroke (HCC)    "I've had 2 or 3"; denies residual on 08/15/2016  . Syncope and collapse     Past Surgical History:  Procedure Laterality Date  . ANTERIOR CERVICAL DECOMP/DISCECTOMY FUSION  02/2004; 04/2005   Hattie Perch 5/14/2012Marland Kitchen Hattie Perch 09/20/2010  . APPLICATION OF ROBOTIC ASSISTANCE FOR SPINAL PROCEDURE N/A 01/02/2018   Procedure: APPLICATION OF ROBOTIC ASSISTANCE FOR SPINAL PROCEDURE;  Surgeon: Barnett Abu,  MD;  Location: [place];  Service: Neurosurgery;  Laterality: N/A;  . BACK SURGERY    . CARDIAC CATHETERIZATION  12/14/2008   ef 50-55%. SHOWED SIGNIFICANT STENOSIS AND TO DIAGONAL SIDE BRANCHES AND IN THE RIGHT VENTRICULAR MARGINAL BRANCH. THESE VESSELS WERE SMALL AND DIFFUSELY DISEASED  . CARDIOVASCULAR STRESS TEST  12/10/2009   EF 61%. EKG negative. Mild peri ischemia. Managed medically.   . CARPAL TUNNEL RELEASE Right 11/2005   [name] 09/20/2010  . CARPAL TUNNEL RELEASE Left 06/2007   [name] 5/132012  . COLONOSCOPY   11/2004   [name] 09/20/2010  . COLONOSCOPY W/ BIOPSIES AND POLYPECTOMY  09/2002   [name] 09/20/2010  . CORONARY ARTERY BYPASS GRAFT N/A 04/02/2019   Procedure: CORONARY ARTERY BYPASS GRAFTING (CABG) using LIMA to LAD; Endoscopically harvested right greater saphenous vein to the PLB, Ramus, and Diag 2.;  Surgeon: Lajuana Matte, MD;  Location: [place];  Service: Open Heart Surgery;  Laterality: N/A;  . INCISION AND DRAINAGE ABSCESS Right 10/18/2016   Procedure: INCISION AND DRAINAGE ABSCESS RIGHT SHOULDER;  Surgeon: [name], MD;  Location: [place];  Service: Orthopedics;  Laterality: Right;  . INCISION AND DRAINAGE OF WOUND Right 08/2005   shoulder/notes 09/20/2010  . IR FLUORO GUIDE CV LINE LEFT  10/19/2016  . IR US GUIDE VASC ACCESS LEFT  10/19/2016  . IRRIGATION AND DEBRIDEMENT SHOULDER Right 12/22/2016   Procedure: IRRIGATION AND DEBRIDEMENT RIGHT SHOULDER;  Surgeon: [name], MD;  Location: [place];  Service: Orthopedics;  Laterality: Right;  . JOINT REPLACEMENT    . LEFT HEART CATH AND CORONARY ANGIOGRAPHY N/A 08/16/2016   Procedure: Left Heart Cath and Coronary Angiography;  Surgeon: [name], MD;  Location: [place] CV LAB;  Service: Cardiovascular;  Laterality: N/A;  . LEFT HEART CATH AND CORONARY ANGIOGRAPHY N/A 03/26/2019   Procedure: LEFT HEART CATH AND CORONARY ANGIOGRAPHY;  Surgeon: Martinique, Aimee Heldman M, MD;  Location: [place] CV LAB;  Service: Cardiovascular;  Laterality: N/A;  . LEFT HEART CATHETERIZATION WITH CORONARY ANGIOGRAM N/A 08/09/2012   Procedure: LEFT HEART CATHETERIZATION WITH CORONARY ANGIOGRAM;  Surgeon: Shoshanah Dapper M Martinique, MD;  Location: [facility] CATH LAB;  Service: Cardiovascular;  Laterality: N/A;  . LUMBAR FUSION    . NEPHRECTOMY Left early 2000s  . REPLACEMENT TOTAL KNEE Right 08/2008   [name] 09/07/2010  . SHOULDER ARTHROSCOPY WITH ROTATOR CUFF REPAIR Right 06/2005   [name] 09/20/2010  . TOTAL KNEE ARTHROPLASTY  06/08/2011    Procedure: TOTAL KNEE ARTHROPLASTY;  Surgeon: [name], MD;  Location: [place];  Service: Orthopedics;  Laterality: Left;  . TOTAL SHOULDER ARTHROPLASTY Right 04/06/2016   Procedure: RIGHT REVERSE TOTAL SHOULDER ARTHROPLASTY;  Surgeon: [name], MD;  Location: [place];  Service: Orthopedics;  Laterality: Right;  . TOTAL SHOULDER REVISION Right 12/22/2016   Procedure: RIGHT SHOULDER GLENOID AND HUMERAL COMPONENT REVISION;  Surgeon: [name], MD;  Location: [place];  Service: Orthopedics;  Laterality: Right;  . US ECHOCARDIOGRAPHY  12/15/2008   EF 60-65%  . US ECHOCARDIOGRAPHY  09/28/2004   EF 55-60%     Current Outpatient Medications  Medication Sig Dispense Refill  . acyclovir (ZOVIRAX) 400 MG tablet Take 400 mg by mouth 3 (three) times daily as needed (For cold sores). .  5  . allopurinol (ZYLOPRIM) 300 MG tablet Take 300 mg by mouth daily as needed (for gout flares).     Marland Kitchen aspirin EC 81 MG tablet Take 1 tablet (81 mg total)  by mouth daily. 90 tablet 3  . atorvastatin (LIPITOR) 80 MG tablet Take 1 tablet (80 mg total) by mouth every evening. 30 tablet 6  . HYDROcodone-acetaminophen (NORCO/VICODIN) 5-325 MG tablet Take 1 tablet by mouth 3 (three) times daily as needed for moderate pain.     Marland Kitchen LORazepam (ATIVAN) 0.5 MG tablet Take 0.5 mg by mouth 2 (two) times daily as needed for anxiety.   2  . metoprolol tartrate (LOPRESSOR) 25 MG tablet Take 25 mg by mouth 2 (two) times daily.    . pantoprazole (PROTONIX) 40 MG tablet Take 40 mg by mouth every evening.     . furosemide (LASIX) 40 MG tablet Take 1 tablet (40 mg total) by mouth daily. For one week;then take 20 mg daily thereafter (Patient taking differently: Take 20 mg by mouth daily. ) 30 tablet 1   Current Facility-Administered Medications  Medication Dose Route Frequency Provider Last Rate Last Admin  . sodium chloride flush (NS) 0.9 % injection 3 mL  3 mL Intravenous Q12H Martinique, Lorilyn Laitinen M, MD        Allergies:   Adhesive  [tape] and Latex    Social History:  The patient  reports that he quit smoking about 30 years ago. He has never used smokeless tobacco. He reports current alcohol use. He reports that he does not use drugs.   Family History:  The patient's family history includes Heart attack in his father; Heart disease in his brother.    ROS:  Please see the history of present illness.   Otherwise, review of systems are positive for none.   All other systems are reviewed and negative.    PHYSICAL EXAM: VS:  BP 140/88 (BP Location: Left Arm, Patient Position: Sitting, Cuff Size: Normal)   Pulse 72   Ht 5\' 7"  (1.702 m)   Wt 229 lb (103.9 kg)   BMI 35.87 kg/m  , BMI Body mass index is 35.87 kg/m. GEN: Well nourished, well developed, in no acute distress  HEENT: normal  Neck: no JVD, carotid bruits, or masses Cardiac: RRR; no murmurs, rubs, or gallops,no edema. Sternal incision is healing well.  Respiratory:  clear to auscultation bilaterally, normal work of breathing GI: soft, nontender, nondistended, + BS MS: no deformity or atrophy  Skin: warm and dry, no rash Neuro:  Strength and sensation are intact Psych: euthymic mood, full affect   EKG:  EKG is not ordered today.   Recent Labs: 08/20/2019: Hemoglobin 12.2; Platelets 261; TSH 2.030 11/21/2019: ALT 9; BUN 21; Creatinine, Ser 1.22; Potassium 4.9; Sodium 138    Lipid Panel    Component Value Date/Time   CHOL 120 11/21/2019 0953   TRIG 112 11/21/2019 0953   HDL 44 11/21/2019 0953   CHOLHDL 2.7 11/21/2019 0953   CHOLHDL 3.4 03/30/2019 0212   VLDL 35 03/30/2019 0212   LDLCALC 56 11/21/2019 0953    Dated 01/25/19: cholesterol 117, triglycerides 125, HDL 39, LDL 53. CMET, CBC, TSH normal Dated 04/09/20: cholesterol 123, triglycerides 130, HDL 44, LDL 34. Creatinine 1.17. otherwise CBC, CMET and TSH normal  Wt Readings from Last 3 Encounters:  05/13/20 229 lb (103.9 kg)  03/30/20 231 lb (104.8 kg)  11/25/19 236 lb (107 kg)       Other studies Reviewed: Additional studies/ records that were reviewed today include:   Cath 03/26/19: Procedures  LEFT HEART CATH AND CORONARY ANGIOGRAPHY  Conclusion    Prox RCA lesion is 45% stenosed.  2nd RPL lesion is 80%  stenosed.  Ost LM to Mid LM lesion is 50% stenosed.  Ost Cx to Mid Cx lesion is 80% stenosed.  Ramus lesion is 90% stenosed.  Mid LAD lesion is 70% stenosed.  2nd Diag lesion is 60% stenosed.  The left ventricular systolic function is normal.  LV end diastolic pressure is normal.  The left ventricular ejection fraction is 55-65% by visual estimate.   1. Three vessel obstructive, Calcific CAD 2. Normal LV function 3. Normal LVEDP  Plan: recommend referral to CT surgery for CABG. Will stop Plavix.    Echo 03/30/19: IMPRESSIONS    1. Left ventricular ejection fraction, by visual estimation, is 55 to 60%. The left ventricle has normal function. There is no left ventricular hypertrophy.  2. Global right ventricle has mildly reduced systolic function.The right ventricular size is mildly enlarged. No increase in right ventricular wall thickness.  3. Left atrial size was normal.  4. Right atrial size was mildly dilated.  5. The mitral valve is normal in structure. No evidence of mitral valve regurgitation.  6. The tricuspid valve is normal in structure. Tricuspid valve regurgitation is trivial.  7. The aortic valve is normal in structure. Aortic valve regurgitation is not visualized. No evidence of aortic valve sclerosis or stenosis.  8. The pulmonic valve was grossly normal. Pulmonic valve regurgitation is not visualized.  9. Aortic dilatation noted. 10. There is mild dilatation of the ascending aorta measuring 38 mm. 11. Normal pulmonary artery systolic pressure. 12. The atrial septum is grossly normal.   ASSESSMENT AND PLAN:  1. CAD  Now s/p CABG x 4 on 04/02/19: he is having some atypical chest pain.  Continue ASA,  high dose  statin and metoprolol. I have recommended a stress Myoview to assess his current chest pain.   2. Atrial fibrillation post op CABG.  Maintaining NSR. Off Eliquis and amiodarone since April.   3. HTN: Blood pressure is good  control now.   4. HLD: Continue on Lipitor 80 mg daily. LDL at goal    5. History of CVA on ASA and Plavix pre op. Now on ASA  Only.     6. Cervical/lumbar spine disease. Followed by Dr. Ellene Route.     7. Chronic diastolic CHF. Volume status looks good on exam. On lasix 40 mg daily. Does not appear to have significant sleep apnea.      Current medicines are reviewed at length with the patient today.  The patient does not have concerns regarding medicines.  The following changes have been made:  See above  Labs/ tests ordered today include:   Orders Placed This Encounter  Procedures  . Myocardial Perfusion Imaging     Disposition:   FU 6 months if Myoview is OK  Signed, Mays Paino Martinique, MD  05/13/2020 8:51 AM    [facility] [address] [place], [place], [address] Phone [phone], Fax [phone]

## 2020-05-13 ENCOUNTER — Other Ambulatory Visit: Payer: Self-pay

## 2020-05-13 ENCOUNTER — Ambulatory Visit (INDEPENDENT_AMBULATORY_CARE_PROVIDER_SITE_OTHER): Payer: Medicare Other | Admitting: Cardiology

## 2020-05-13 ENCOUNTER — Encounter: Payer: Self-pay | Admitting: Cardiology

## 2020-05-13 VITALS — BP 140/88 | HR 72 | Ht 67.0 in | Wt 229.0 lb

## 2020-05-13 DIAGNOSIS — R072 Precordial pain: Secondary | ICD-10-CM

## 2020-05-13 DIAGNOSIS — I25118 Atherosclerotic heart disease of native coronary artery with other forms of angina pectoris: Secondary | ICD-10-CM | POA: Diagnosis not present

## 2020-05-13 DIAGNOSIS — Z951 Presence of aortocoronary bypass graft: Secondary | ICD-10-CM | POA: Diagnosis not present

## 2020-05-13 DIAGNOSIS — I10 Essential (primary) hypertension: Secondary | ICD-10-CM | POA: Diagnosis not present

## 2020-05-13 DIAGNOSIS — I5032 Chronic diastolic (congestive) heart failure: Secondary | ICD-10-CM | POA: Diagnosis not present

## 2020-05-13 DIAGNOSIS — E7800 Pure hypercholesterolemia, unspecified: Secondary | ICD-10-CM

## 2020-05-13 NOTE — Addendum Note (Signed)
Addended by: Neoma Laming on: 05/13/2020 08:58 AM   Modules accepted: Orders

## 2020-05-13 NOTE — Patient Instructions (Signed)
Medication Instructions:  Continue same medications *If you need a refill on your cardiac medications before your next appointment, please call your pharmacy*   Lab Work: Covid test to be scheduled 3 days before stress test   Testing/Procedures: Stress Myoview   Follow-Up: At [facility], you and your health needs are our priority.  As part of our continuing mission to provide you with exceptional heart care, we have created designated Provider Care Teams.  These Care Teams include your primary Cardiologist (physician) and Advanced Practice Providers (APPs -  Physician Assistants and Nurse Practitioners) who all work together to provide you with the care you need, when you need it.  We recommend signing up for the patient portal called "MyChart".  Sign up information is provided on this After Visit Summary.  MyChart is used to connect with patients for Virtual Visits (Telemedicine).  Patients are able to view lab/test results, encounter notes, upcoming appointments, etc.  Non-urgent messages can be sent to your provider as well.   To learn more about what you can do with MyChart, go to [email or web address].    Your next appointment:  6 months    Call in March to schedule July appointment    The format for your next appointment: Office   Provider:  Dr.Jordan

## 2020-05-20 NOTE — Addendum Note (Signed)
Addended by: Martinique, Florene Brill M on: 05/20/2020 08:54 AM   Modules accepted: Orders

## 2020-05-27 ENCOUNTER — Other Ambulatory Visit (HOSPITAL_COMMUNITY)
Admission: RE | Admit: 2020-05-27 | Discharge: 2020-05-27 | Disposition: A | Discharge status: Home / Self Care | Payer: Medicare Other | Source: Ambulatory Visit | Attending: Cardiology | Admitting: Cardiology

## 2020-05-27 DIAGNOSIS — Z20822 Contact with and (suspected) exposure to covid-19: Secondary | ICD-10-CM | POA: Diagnosis not present

## 2020-05-27 DIAGNOSIS — Z01812 Encounter for preprocedural laboratory examination: Secondary | ICD-10-CM | POA: Insufficient documentation

## 2020-05-27 LAB — SARS CORONAVIRUS 2 (TAT 6-24 HRS): SARS Coronavirus 2: NEGATIVE

## 2020-05-28 ENCOUNTER — Telehealth (HOSPITAL_COMMUNITY): Payer: Self-pay | Admitting: *Deleted

## 2020-05-28 NOTE — Telephone Encounter (Signed)
Close encounter 

## 2020-05-29 ENCOUNTER — Inpatient Hospital Stay (HOSPITAL_COMMUNITY): Admission: RE | Admit: 2020-05-29 | Payer: Medicare HMO | Source: Ambulatory Visit

## 2020-05-29 ENCOUNTER — Ambulatory Visit (HOSPITAL_COMMUNITY)
Admission: RE | Admit: 2020-05-29 | Discharge: 2020-05-29 | Disposition: A | Discharge status: Home / Self Care | Payer: Medicare Other | Source: Ambulatory Visit | Attending: Cardiology | Admitting: Cardiology

## 2020-05-29 ENCOUNTER — Other Ambulatory Visit: Payer: Self-pay

## 2020-05-29 DIAGNOSIS — R072 Precordial pain: Secondary | ICD-10-CM | POA: Diagnosis not present

## 2020-05-29 LAB — MYOCARDIAL PERFUSION IMAGING
LV dias vol: 102 mL (ref 62–150)
LV sys vol: 53 mL
Peak HR: 108 {beats}/min
Rest HR: 87 {beats}/min
SDS: 2
SRS: 2
SSS: 4
TID: 1.17

## 2020-05-29 MED ORDER — TECHNETIUM TC 99M TETROFOSMIN IV KIT
29.9000 | PACK | Freq: Once | INTRAVENOUS | Status: AC | PRN
  Administered 2020-05-29: 29.9 via INTRAVENOUS
  Filled 2020-05-29: qty 30

## 2020-05-29 MED ORDER — REGADENOSON 0.4 MG/5ML IV SOLN
0.4000 mg | Freq: Once | INTRAVENOUS | Status: AC
Start: 2020-05-29 — End: 2020-05-29
  Administered 2020-05-29: 0.4 mg via INTRAVENOUS

## 2020-05-29 MED ORDER — TECHNETIUM TC 99M TETROFOSMIN IV KIT
9.5000 | PACK | Freq: Once | INTRAVENOUS | Status: AC | PRN
  Administered 2020-05-29: 9.5 via INTRAVENOUS
  Filled 2020-05-29: qty 10

## 2020-06-08 ENCOUNTER — Telehealth: Payer: Self-pay | Admitting: Cardiology

## 2020-06-08 NOTE — Telephone Encounter (Signed)
*  STAT* If patient is at the pharmacy, call can be transferred to refill team.   1. Which medications need to be refilled? (please list name of each medication and dose if known) furosemide   2. Which pharmacy/location (including street and city if local pharmacy) is medication to be sent to? [place]  3. Do they need a 30 day or 90 day supply? [place]

## 2020-06-09 MED ORDER — FUROSEMIDE 40 MG PO TABS
40.0000 mg | ORAL_TABLET | Freq: Every day | ORAL | 1 refills | Status: DC
Start: 2020-06-09 — End: 2020-06-11

## 2020-06-09 NOTE — Telephone Encounter (Signed)
Refill sent tp pharmacy.

## 2020-06-11 ENCOUNTER — Other Ambulatory Visit: Payer: Self-pay | Admitting: *Deleted

## 2020-06-11 MED ORDER — FUROSEMIDE 40 MG PO TABS: 40.0000 mg | ORAL_TABLET | Freq: Every day | ORAL | 5 refills | Status: DC

## 2020-06-11 NOTE — Telephone Encounter (Signed)
Called patient to let him know that a refill of his Furosemide has been sent over to the pharmacy.

## 2020-06-11 NOTE — Telephone Encounter (Signed)
*  STAT* If patient is at the pharmacy, call can be transferred to refill team.   1. Which medications need to be refilled? (please list name of each medication and dose if known)  furosemide (LASIX) 40 MG tablet  2. Which pharmacy/location (including street and city if local pharmacy) is medication to be sent to?  [id number] - [place], [place] - [address]  3. Do they need a 30 day or 90 day supply? 30 with refills  Patient has changed pharmacies. Refill was sent to [facility] but patient does not use that pharmacy. Refill must go to [facility]

## 2020-06-15 ENCOUNTER — Telehealth: Payer: Self-pay | Admitting: Cardiology

## 2020-06-15 MED ORDER — FUROSEMIDE 20 MG PO TABS
20.0000 mg | ORAL_TABLET | Freq: Every day | ORAL | 5 refills | Status: DC
Start: 2020-06-15 — End: 2020-10-15

## 2020-06-15 NOTE — Telephone Encounter (Signed)
° ° °  Pt c/o medication issue:  1. Name of Medication: furosemide (LASIX) 40 MG tablet  2. How are you currently taking this medication (dosage and times per day)? Take 1 tablet (40 mg total) by mouth daily. For one week;then take 20 mg daily thereafter  3. Are you having a reaction (difficulty breathing--STAT)?   4. What is your medication issue? Pt's wife calling, she said they got pt's 2nd refill for furosemide. Since pt is now taking 20 mg they would like to changed prescription to 20 mg so they don't have to cut it. They got the prescription this month, they just want the next refill to be 20 mg.

## 2020-06-15 NOTE — Telephone Encounter (Signed)
Furosemide prescription updated to 20mg  tablets.

## 2020-07-10 DIAGNOSIS — Z961 Presence of intraocular lens: Secondary | ICD-10-CM | POA: Diagnosis not present

## 2020-07-10 DIAGNOSIS — H35372 Puckering of macula, left eye: Secondary | ICD-10-CM | POA: Diagnosis not present

## 2020-07-24 DIAGNOSIS — E785 Hyperlipidemia, unspecified: Secondary | ICD-10-CM | POA: Diagnosis not present

## 2020-07-24 DIAGNOSIS — I10 Essential (primary) hypertension: Secondary | ICD-10-CM | POA: Diagnosis not present

## 2020-07-24 DIAGNOSIS — F419 Anxiety disorder, unspecified: Secondary | ICD-10-CM | POA: Diagnosis not present

## 2020-07-24 DIAGNOSIS — G894 Chronic pain syndrome: Secondary | ICD-10-CM | POA: Diagnosis not present

## 2020-09-01 DIAGNOSIS — E785 Hyperlipidemia, unspecified: Secondary | ICD-10-CM | POA: Diagnosis not present

## 2020-09-01 DIAGNOSIS — I2510 Atherosclerotic heart disease of native coronary artery without angina pectoris: Secondary | ICD-10-CM | POA: Diagnosis not present

## 2020-09-01 DIAGNOSIS — K219 Gastro-esophageal reflux disease without esophagitis: Secondary | ICD-10-CM | POA: Diagnosis not present

## 2020-09-01 DIAGNOSIS — I10 Essential (primary) hypertension: Secondary | ICD-10-CM | POA: Diagnosis not present

## 2020-09-14 ENCOUNTER — Other Ambulatory Visit: Payer: Self-pay

## 2020-09-14 ENCOUNTER — Ambulatory Visit (INDEPENDENT_AMBULATORY_CARE_PROVIDER_SITE_OTHER): Payer: Medicare Other

## 2020-09-14 ENCOUNTER — Ambulatory Visit: Payer: Medicare Other | Admitting: Pulmonary Disease

## 2020-09-14 ENCOUNTER — Encounter: Payer: Self-pay | Admitting: Pulmonary Disease

## 2020-09-14 VITALS — BP 132/80 | HR 71 | Temp 98.0°F | Ht 67.0 in | Wt 236.0 lb

## 2020-09-14 DIAGNOSIS — R0602 Shortness of breath: Secondary | ICD-10-CM | POA: Diagnosis not present

## 2020-09-14 MED ORDER — TRELEGY ELLIPTA 100-62.5-25 MCG/INH IN AEPB: 1.0000 | INHALATION_SPRAY | Freq: Every day | RESPIRATORY_TRACT | 0 refills | Status: DC

## 2020-09-14 MED ORDER — TRELEGY ELLIPTA 100-62.5-25 MCG/INH IN AEPB: 1.0000 | INHALATION_SPRAY | Freq: Every day | RESPIRATORY_TRACT | 3 refills | Status: DC

## 2020-09-14 NOTE — Patient Instructions (Signed)
Shortness of breath  Chest x-ray  Echocardiogram-to compare with the one he had a year ago to assess functioning of the heart  Breathing study-assesses whether there is any airway obstruction   I will see you back in 3 to 4 weeks, call with significant concerns  Sample of Trelegy, prescription for Trelegy will be sent into pharmacy for you

## 2020-09-14 NOTE — Progress Notes (Signed)
Jake Dunn    [id number]    1941/04/15  Primary Care Physician:White, Caren Griffins, MD  Referring Physician: Harlan Stains, MD [place],  [place] [address]  Chief complaint:   Patient is being seen for shortness of breath  HPI:  Shortness of breath has worsened over the last couple years He stated he noticed shortness of breath following his cardiac surgery  Exercise tolerance less than 5 to 10 minutes of active walk Any great he usually challenges him quite significantly Unable to mow his lawn  Remote smoking history but never really smoked significantly in the past-quit in 1991 Denies any known history of lung disease No significant family history of lung disease  He does have chronic musculoskeletal pains-5 back surgeries Both knees replaced  Periodic use of albuterol-occasionally helps  History of coronary artery disease, atrial fibrillation-follows up with cardiology He had a stress test recently which was okay  No pertinent occupational history And environmental exposures contributing to shortness of breath  Outpatient Encounter Medications as of 09/14/2020  Medication Sig  . acyclovir (ZOVIRAX) 400 MG tablet Take 400 mg by mouth 3 (three) times daily as needed (For cold sores). .  . albuterol (VENTOLIN HFA) 108 (90 Base) MCG/ACT inhaler Inhale 2 puffs into the lungs every 6 (six) hours as needed.  Marland Kitchen allopurinol (ZYLOPRIM) 300 MG tablet Take 300 mg by mouth daily as needed (for gout flares).   Marland Kitchen aspirin EC 81 MG tablet Take 1 tablet (81 mg total) by mouth daily.  Marland Kitchen atorvastatin (LIPITOR) 80 MG tablet Take 1 tablet (80 mg total) by mouth every evening.  . furosemide (LASIX) 20 MG tablet Take 1 tablet (20 mg total) by mouth daily.  Marland Kitchen HYDROcodone-acetaminophen (NORCO/VICODIN) 5-325 MG tablet Take 1 tablet by mouth 3 (three) times daily as needed for moderate pain.   Marland Kitchen LORazepam (ATIVAN) 0.5 MG tablet Take 0.5 mg by mouth 2 (two)  times daily as needed for anxiety.   . metoprolol tartrate (LOPRESSOR) 25 MG tablet Take 25 mg by mouth 2 (two) times daily.  . pantoprazole (PROTONIX) 40 MG tablet Take 40 mg by mouth every evening.    Facility-Administered Encounter Medications as of 09/14/2020  Medication  . sodium chloride flush (NS) 0.9 % injection 3 mL    Allergies as of 09/14/2020 - Review Complete 09/14/2020  Allergen Reaction Noted  . Adhesive [tape] Rash 05/25/2011  . Latex Rash 05/25/2011    Past Medical History:  Diagnosis Date  . Adenomatous polyp   . Anxiety   . Arthritis    "knees, ankles, shoulders" (08/15/2016)  . CAD (coronary artery disease)    s/p cath in 2014 showing significant stenosis in the diagonal side branches and in the RV marginal branch. These vessels were small and diffusely diseased. He is managed medically. 08/16/16 Cath, no disease progression  . Chronic lower back pain   . Disc disease, degenerative, cervical   . GERD (gastroesophageal reflux disease)   . Gout   . Hyperlipidemia   . Hypertension   . Osteoarthritis   . Renal cell carcinoma    left kidney removal  . Stroke [facility] [address])    MINI STROKE 15+ YRS AGO, no residual  . Stroke ([place])    "I've had 2 or 3"; denies residual on 08/15/2016  . Syncope and collapse     Past Surgical History:  Procedure Laterality Date  . ANTERIOR CERVICAL DECOMP/DISCECTOMY FUSION  02/2004; 04/2005   /  notes 09/20/2010; [name] 09/20/2010  . APPLICATION OF ROBOTIC ASSISTANCE FOR SPINAL PROCEDURE N/A 01/02/2018   Procedure: APPLICATION OF ROBOTIC ASSISTANCE FOR SPINAL PROCEDURE;  Surgeon: Kristeen Miss, MD;  Location: [facility];  Service: Neurosurgery;  Laterality: N/A;  . BACK SURGERY    . CARDIAC CATHETERIZATION  12/14/2008   ef 50-55%. SHOWED SIGNIFICANT STENOSIS AND TO DIAGONAL SIDE BRANCHES AND IN THE RIGHT VENTRICULAR MARGINAL BRANCH. THESE VESSELS WERE SMALL AND DIFFUSELY DISEASED  . CARDIOVASCULAR STRESS TEST  12/10/2009   EF 61%. EKG negative. Mild peri  ischemia. Managed medically.   . CARPAL TUNNEL RELEASE Right 11/2005   [name] 09/20/2010  . CARPAL TUNNEL RELEASE Left 06/2007   [name] 5/132012  . COLONOSCOPY  11/2004   [name] 09/20/2010  . COLONOSCOPY W/ BIOPSIES AND POLYPECTOMY  09/2002   [name] 09/20/2010  . CORONARY ARTERY BYPASS GRAFT N/A 04/02/2019   Procedure: CORONARY ARTERY BYPASS GRAFTING (CABG) using LIMA to LAD; Endoscopically harvested right greater saphenous vein to the PLB, Ramus, and Diag 2.;  Surgeon: Lajuana Matte, MD;  Location: [place];  Service: Open Heart Surgery;  Laterality: N/A;  . INCISION AND DRAINAGE ABSCESS Right 10/18/2016   Procedure: INCISION AND DRAINAGE ABSCESS RIGHT SHOULDER;  Surgeon: [name], MD;  Location: [place];  Service: Orthopedics;  Laterality: Right;  . INCISION AND DRAINAGE OF WOUND Right 08/2005   shoulder/notes 09/20/2010  . IR FLUORO GUIDE CV LINE LEFT  10/19/2016  . IR US GUIDE VASC ACCESS LEFT  10/19/2016  . IRRIGATION AND DEBRIDEMENT SHOULDER Right 12/22/2016   Procedure: IRRIGATION AND DEBRIDEMENT RIGHT SHOULDER;  Surgeon: [name], MD;  Location: [place];  Service: Orthopedics;  Laterality: Right;  . JOINT REPLACEMENT    . LEFT HEART CATH AND CORONARY ANGIOGRAPHY N/A 08/16/2016   Procedure: Left Heart Cath and Coronary Angiography;  Surgeon: [name], MD;  Location: [facility] CV [facility];  Service: Cardiovascular;  Laterality: N/A;  . LEFT HEART CATH AND CORONARY ANGIOGRAPHY N/A 03/26/2019   Procedure: LEFT HEART CATH AND CORONARY ANGIOGRAPHY;  Surgeon: Martinique, Peter M, MD;  Location: [facility];  Service: Cardiovascular;  Laterality: N/A;  . LEFT HEART CATHETERIZATION WITH CORONARY ANGIOGRAM N/A 08/09/2012   Procedure: LEFT HEART CATHETERIZATION WITH CORONARY ANGIOGRAM;  Surgeon: Peter M Martinique, MD;  Location: [facility] CATH LAB;  Service: Cardiovascular;  Laterality: N/A;  . LUMBAR FUSION    . NEPHRECTOMY Left early 2000s  . REPLACEMENT TOTAL KNEE  Right 08/2008   [name] 09/07/2010  . SHOULDER ARTHROSCOPY WITH ROTATOR CUFF REPAIR Right 06/2005   [name] 09/20/2010  . TOTAL KNEE ARTHROPLASTY  06/08/2011   Procedure: TOTAL KNEE ARTHROPLASTY;  Surgeon: [name], MD;  Location: [facility];  Service: Orthopedics;  Laterality: Left;  . TOTAL SHOULDER ARTHROPLASTY Right 04/06/2016   Procedure: RIGHT REVERSE TOTAL SHOULDER ARTHROPLASTY;  Surgeon: [name], MD;  Location: [place];  Service: Orthopedics;  Laterality: Right;  . TOTAL SHOULDER REVISION Right 12/22/2016   Procedure: RIGHT SHOULDER GLENOID AND HUMERAL COMPONENT REVISION;  Surgeon: [name], MD;  Location: [place];  Service: Orthopedics;  Laterality: Right;  . US ECHOCARDIOGRAPHY  12/15/2008   EF 60-65%  . US ECHOCARDIOGRAPHY  09/28/2004   EF 55-60%    Family History  Problem Relation Age of Onset  . Heart attack Father   . Heart disease Brother   . Allergic rhinitis Neg Hx   . Angioedema Neg Hx   . Asthma Neg Hx   .  Atopy Neg Hx   . Eczema Neg Hx   . Immunodeficiency Neg Hx   . Urticaria Neg Hx     Social History   Socioeconomic History  . Marital status: Married    Spouse name: Not on file  . Number of children: Not on file  . Years of education: Not on file  . Highest education level: Not on file  Occupational History  . Not on file  Tobacco Use  . Smoking status: Former Smoker    Quit date: 05/31/1989    Years since quitting: 31.3  . Smokeless tobacco: Never Used  . Tobacco comment: 08/15/2016 "hadn't smoked a carton of cigarettes all my life"  Vaping Use  . Vaping Use: Never used  Substance and Sexual Activity  . Alcohol use: Yes    Alcohol/week: 0.0 standard drinks    Comment: "sometimes daily, sometimes weekly" beer  . Drug use: No  . Sexual activity: Not Currently  Other Topics Concern  . Not on file  Social History Narrative  . Not on file   Social Determinants of Health   Financial Resource Strain: Not on file  Food Insecurity: Not on  file  Transportation Needs: Not on file  Physical Activity: Not on file  Stress: Not on file  Social Connections: Not on file  Intimate Partner Violence: Not on file    Review of Systems  Constitutional: Negative for fatigue.  Respiratory: Positive for shortness of breath. Negative for cough.   Cardiovascular: Negative for chest pain.  Musculoskeletal: Positive for arthralgias and back pain.    Vitals:   09/14/20 0903  BP: 132/80  Pulse: 71  Temp: 98 F (36.7 C)  SpO2: 100%     Physical Exam Constitutional:      Appearance: He is obese.  HENT:     Head: Normocephalic.     Nose: Nose normal.     Mouth/Throat:     Mouth: Mucous membranes are moist.  Eyes:     Pupils: Pupils are equal, round, and reactive to light.  Cardiovascular:     Rate and Rhythm: Normal rate and regular rhythm.     Heart sounds: No murmur heard. Friction rub present.  Pulmonary:     Effort: No respiratory distress.     Breath sounds: No stridor. No wheezing, rhonchi or rales.  Musculoskeletal:     Cervical back: No rigidity or tenderness.  Neurological:     Mental Status: He is alert.  Psychiatric:        Mood and Affect: Mood normal.    Data Reviewed:  Chest x-ray April 2021-no acute infiltrate  Myocardial perfusion scan Myocardial perfusion is normal.   This is a low risk study.  Overall left ventricular systolic function was abnormal.    Nuclear stress EF:  48%.  The left ventricular ejection fraction is mildly decreased (45-54%).    Echocardiogram from a year ago showed ejection fraction of 60 to 65% No mention of pulmonary hypertension  Assessment:  Shortness of breath -Denies underlying lung disease -No significant smoking history -No underlying lung disease known to patient  Does have a history of coronary artery disease -Mildly reduced ejection fraction on recent perfusion scan -No mention of pulmonary hypertension on previous study  Musculoskeletal pain and  discomfort -This may be contributing to symptoms -Shortness of breath is quite significant and causes significant limitations at present  Obesity -May be contributing to symptoms    Plan/Recommendations: .  Schedule him for pulmonary function  test  .  Schedule for echocardiogram, assess right heart function as well  .  Trial with Trelegy -Sample provided, prescription sent to pharmacy  .  Graded exercise as tolerated  .  Continue albuterol use as needed  .  I will see him back in about 4 to 6 weeks  .  I spent 45 minutes dedicated to the care of this patient on the date of this encounter to include previsit review of records, face-to-face time with the patient discussing conditions above, post visit ordering of testing, clinical documentation with electronic health record and communicated necessary findings to members of the patient's care team   Sherrilyn Rist MD [facility] 09/14/2020, 9:32 AM  CC: Harlan Stains, MD

## 2020-10-06 ENCOUNTER — Other Ambulatory Visit (HOSPITAL_COMMUNITY)
Admission: RE | Admit: 2020-10-06 | Discharge: 2020-10-06 | Disposition: A | Discharge status: Home / Self Care | Payer: Medicare Other | Source: Ambulatory Visit | Attending: Pulmonary Disease | Admitting: Pulmonary Disease

## 2020-10-06 DIAGNOSIS — U071 COVID-19: Secondary | ICD-10-CM | POA: Insufficient documentation

## 2020-10-06 DIAGNOSIS — Z01812 Encounter for preprocedural laboratory examination: Secondary | ICD-10-CM | POA: Diagnosis not present

## 2020-10-06 LAB — SARS CORONAVIRUS 2 (TAT 6-24 HRS): SARS Coronavirus 2: POSITIVE — AB

## 2020-10-07 ENCOUNTER — Telehealth (HOSPITAL_COMMUNITY): Payer: Self-pay

## 2020-10-07 ENCOUNTER — Telehealth: Payer: Self-pay | Admitting: Physician Assistant

## 2020-10-07 ENCOUNTER — Telehealth: Payer: Self-pay | Admitting: Pulmonary Disease

## 2020-10-07 NOTE — Telephone Encounter (Signed)
ATC on cell # x1, reached vm, left vm requesting him to return out call asap.  Will await his return call.

## 2020-10-07 NOTE — Telephone Encounter (Signed)
10/07/20 - Contacted surgeon - spoke to Maudie Mercury - advised of patient's Positive Covid19 lab results.

## 2020-10-07 NOTE — Telephone Encounter (Signed)
Patient's wife, [name], listed on DPR, returned call, I spoke with her and verified that he was at home, he was out earlier doing yard work, but was not in contact with anyone.  Advised that he needed to quarantine for 5 days.  She stated she had covid recently and then he had the same symptoms, she just assumed he had covid at the same time she had it.  He is not experiencing any symptoms at this time and just had cold symptoms at the time they were sick.  I made her aware that he would have to wait 90 days to be tested again and have the PFT done.  Scheduled Covid test for 9/2 and PFT for 9/6.  She verbalized understanding.  Nothing further needed.

## 2020-10-07 NOTE — Telephone Encounter (Signed)
LMTCB for the pt 

## 2020-10-07 NOTE — Telephone Encounter (Signed)
Called to speak with the pt and he was not home, he was working per spouse  Advised her that we will need to cancel PFT and OV for tomorrow as pt has tested pos to covid  She states that he has been vaxxed x 4 and not really having any symptoms, just a little cough  She had tested pos 2 wks ago  I asked her to have him call us ASAP so we can verify his symptoms and make sure he understands to quarantined x 5 days  She will call him to come home from work now and then have him call us  Will hold due to nature of call

## 2020-10-07 NOTE — Telephone Encounter (Signed)
Called to discuss with patient about COVID-19 symptoms and the use of one of the available treatments for those with mild to moderate Covid symptoms and at a high risk of hospitalization.  Pt appears to qualify for outpatient treatment due to co-morbid conditions and/or a member of an at-risk group in accordance with the FDA Emergency Use Authorization.    Symptom onset: unknon  Patient reports months of chronic dyspnea. Has runny nose and cough for at least 4 weeks. Wife tested positive few weeks ago. Patient wants to defer any COVID Tx.    [place], [place]  10/07/2020 11:48 AM

## 2020-10-09 ENCOUNTER — Ambulatory Visit: Payer: Medicare Other | Admitting: Pulmonary Disease

## 2020-10-12 ENCOUNTER — Ambulatory Visit (HOSPITAL_COMMUNITY)
Admission: EM | Admit: 2020-10-12 | Discharge: 2020-10-12 | Disposition: A | Discharge status: Home / Self Care | Payer: Medicare Other | Attending: Internal Medicine | Admitting: Internal Medicine

## 2020-10-12 ENCOUNTER — Encounter (HOSPITAL_COMMUNITY): Payer: Self-pay | Admitting: Emergency Medicine

## 2020-10-12 ENCOUNTER — Other Ambulatory Visit (HOSPITAL_COMMUNITY): Payer: Medicare Other

## 2020-10-12 ENCOUNTER — Ambulatory Visit (INDEPENDENT_AMBULATORY_CARE_PROVIDER_SITE_OTHER): Payer: Medicare Other

## 2020-10-12 DIAGNOSIS — R0602 Shortness of breath: Secondary | ICD-10-CM

## 2020-10-12 DIAGNOSIS — R059 Cough, unspecified: Secondary | ICD-10-CM

## 2020-10-12 DIAGNOSIS — U071 COVID-19: Secondary | ICD-10-CM | POA: Diagnosis not present

## 2020-10-12 DIAGNOSIS — R062 Wheezing: Secondary | ICD-10-CM

## 2020-10-12 LAB — CBC WITH DIFFERENTIAL/PLATELET
Abs Immature Granulocytes: 0 10*3/uL (ref 0.00–0.07)
Band Neutrophils: 0 %
Basophils Absolute: 0.1 10*3/uL (ref 0.0–0.1)
Basophils Relative: 1 %
Blasts: 0 %
Eosinophils Absolute: 0 10*3/uL (ref 0.0–0.5)
Eosinophils Relative: 0 %
HCT: 44.4 % (ref 39.0–52.0)
Hemoglobin: 14.7 g/dL (ref 13.0–17.0)
Lymphocytes Relative: 9 %
Lymphs Abs: 0.9 10*3/uL (ref 0.7–4.0)
MCH: 30.6 pg (ref 26.0–34.0)
MCHC: 33.1 g/dL (ref 30.0–36.0)
MCV: 92.5 fL (ref 80.0–100.0)
Metamyelocytes Relative: 0 %
Monocytes Absolute: 1.4 10*3/uL — ABNORMAL HIGH (ref 0.1–1.0)
Monocytes Relative: 14 %
Myelocytes: 0 %
Neutro Abs: 7.3 10*3/uL (ref 1.7–7.7)
Neutrophils Relative %: 76 %
Other: 0 %
Platelets: 196 10*3/uL (ref 150–400)
Promyelocytes Relative: 0 %
RBC: 4.8 MIL/uL (ref 4.22–5.81)
RDW: 13.2 % (ref 11.5–15.5)
WBC: 9.7 10*3/uL (ref 4.0–10.5)
nRBC: 0 % (ref 0.0–0.2)
nRBC: 0 /100 WBC

## 2020-10-12 LAB — BASIC METABOLIC PANEL
Anion gap: 14 (ref 5–15)
BUN: 12 mg/dL (ref 8–23)
CO2: 20 mmol/L — ABNORMAL LOW (ref 22–32)
Calcium: 9 mg/dL (ref 8.9–10.3)
Chloride: 100 mmol/L (ref 98–111)
Creatinine, Ser: 1.15 mg/dL (ref 0.61–1.24)
GFR, Estimated: >60 mL/min (ref >60)
Glucose, Bld: 124 mg/dL — ABNORMAL HIGH (ref 70–99)
Potassium: 3.5 mmol/L (ref 3.5–5.1)
Sodium: 134 mmol/L — ABNORMAL LOW (ref 135–145)

## 2020-10-12 MED ORDER — ONDANSETRON 4 MG PO TBDP: 4.0000 mg | ORAL_TABLET | Freq: Three times a day (TID) | ORAL | 0 refills | Status: DC | PRN

## 2020-10-12 MED ORDER — BENZONATATE 100 MG PO CAPS: 100.0000 mg | ORAL_CAPSULE | Freq: Three times a day (TID) | ORAL | 0 refills | Status: DC | PRN

## 2020-10-12 NOTE — ED Provider Notes (Signed)
Jake Dunn    CSN: [id number] Arrival date & time: 10/12/20  1235      History   Chief Complaint Chief Complaint  Patient presents with  . Shortness of Breath  . Cough  . Fatigue  . Eye Problem    HPI Jake Dunn is a 80 y.o. male comes to the urgent care with the 6 to 7-day history of cough, wheezing, shortness of breath and fatigue.  Patient was diagnosed with COVID-19 infection on 10/06/2020.  Patient says that he has had worsening cough which sounds wet, shortness of breath at rest and with activity and increasing fatigue.  Patient admits having some nausea and decreased oral intake.  He denies any vomiting or diarrhea.  He endorses chest pain with coughing.    HPI  Past Medical History:  Diagnosis Date  . Adenomatous polyp   . Anxiety   . Arthritis    "knees, ankles, shoulders" (08/15/2016)  . CAD (coronary artery disease)    s/p cath in 2014 showing significant stenosis in the diagonal side branches and in the RV marginal branch. These vessels were small and diffusely diseased. He is managed medically. 08/16/16 Cath, no disease progression  . Chronic lower back pain   . Disc disease, degenerative, cervical   . GERD (gastroesophageal reflux disease)   . Gout   . Hyperlipidemia   . Hypertension   . Osteoarthritis   . Renal cell carcinoma    left kidney removal  . Stroke [facility])    MINI STROKE 15+ YRS AGO, no residual  . Stroke ([place])    "I've had 2 or 3"; denies residual on 08/15/2016  . Syncope and collapse     Patient Active Problem List   Diagnosis Date Noted  . S/P CABG x 4 04/02/2019  . CAD, multiple vessel 03/29/2019  . Crescendo angina ([place]) 03/29/2019  . Spinal stenosis of lumbar region with radiculopathy 01/02/2018  . Antibiotic drug intolerance 03/01/2017  . 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[place]) 12/22/2016  . Medication monitoring encounter 11/15/2016  . Abscess of right shoulder 10/18/2016  . Unstable angina ([place]) 08/15/2016  . Localized  primary osteoarthritis of right shoulder region 04/06/2016  . Coughing/wheezing 08/17/2015  . Angioedema 08/17/2015  . Hypertension   . CAD (coronary artery disease)   . Stroke ([place])   . Hyperlipidemia   . Dyspnea 08/07/2012  . Pre-operative clearance 06/01/2011    Past Surgical History:  Procedure Laterality Date  . ANTERIOR CERVICAL DECOMP/DISCECTOMY FUSION  02/2004; 04/2005   [name] 5/14/2012Marland Kitchen [name] 09/20/2010  . APPLICATION OF ROBOTIC ASSISTANCE FOR SPINAL PROCEDURE N/A 01/02/2018   Procedure: APPLICATION OF ROBOTIC ASSISTANCE FOR SPINAL PROCEDURE;  Surgeon: Kristeen Miss, MD;  Location: [place];  Service: Neurosurgery;  Laterality: N/A;  . BACK SURGERY    . CARDIAC CATHETERIZATION  12/14/2008   ef 50-55%. SHOWED SIGNIFICANT STENOSIS AND TO DIAGONAL SIDE BRANCHES AND IN THE RIGHT VENTRICULAR MARGINAL BRANCH. THESE VESSELS WERE SMALL AND DIFFUSELY DISEASED  . CARDIOVASCULAR STRESS TEST  12/10/2009   EF 61%. EKG negative. Mild peri ischemia. Managed medically.   . CARPAL TUNNEL RELEASE Right 11/2005   [name] 09/20/2010  . CARPAL TUNNEL RELEASE Left 06/2007   [name] 5/132012  . COLONOSCOPY  11/2004   [name] 09/20/2010  . COLONOSCOPY W/ BIOPSIES AND POLYPECTOMY  09/2002   [name] 09/20/2010  . CORONARY ARTERY BYPASS GRAFT N/A 04/02/2019   Procedure: CORONARY ARTERY BYPASS GRAFTING (CABG) using LIMA to LAD; Endoscopically harvested right greater saphenous vein to  the PLB, Ramus, and Diag 2.;  Surgeon: Lajuana Matte, MD;  Location: [place];  Service: Open Heart Surgery;  Laterality: N/A;  . INCISION AND DRAINAGE ABSCESS Right 10/18/2016   Procedure: INCISION AND DRAINAGE ABSCESS RIGHT SHOULDER;  Surgeon: [name], MD;  Location: [place];  Service: Orthopedics;  Laterality: Right;  . INCISION AND DRAINAGE OF WOUND Right 08/2005   shoulder/notes 09/20/2010  . IR FLUORO GUIDE CV LINE LEFT  10/19/2016  . IR US GUIDE VASC ACCESS LEFT  10/19/2016  . IRRIGATION AND  DEBRIDEMENT SHOULDER Right 12/22/2016   Procedure: IRRIGATION AND DEBRIDEMENT RIGHT SHOULDER;  Surgeon: [name], MD;  Location: [place];  Service: Orthopedics;  Laterality: Right;  . JOINT REPLACEMENT    . LEFT HEART CATH AND CORONARY ANGIOGRAPHY N/A 08/16/2016   Procedure: Left Heart Cath and Coronary Angiography;  Surgeon: [name], MD;  Location: [place] CV LAB;  Service: Cardiovascular;  Laterality: N/A;  . LEFT HEART CATH AND CORONARY ANGIOGRAPHY N/A 03/26/2019   Procedure: LEFT HEART CATH AND CORONARY ANGIOGRAPHY;  Surgeon: Martinique, Peter M, MD;  Location: [place] CV LAB;  Service: Cardiovascular;  Laterality: N/A;  . LEFT HEART CATHETERIZATION WITH CORONARY ANGIOGRAM N/A 08/09/2012   Procedure: LEFT HEART CATHETERIZATION WITH CORONARY ANGIOGRAM;  Surgeon: Peter M Martinique, MD;  Location: [facility] CATH LAB;  Service: Cardiovascular;  Laterality: N/A;  . LUMBAR FUSION    . NEPHRECTOMY Left early 2000s  . REPLACEMENT TOTAL KNEE Right 08/2008   [name] 09/07/2010  . SHOULDER ARTHROSCOPY WITH ROTATOR CUFF REPAIR Right 06/2005   [name] 09/20/2010  . TOTAL KNEE ARTHROPLASTY  06/08/2011   Procedure: TOTAL KNEE ARTHROPLASTY;  Surgeon: [name], MD;  Location: [place];  Service: Orthopedics;  Laterality: Left;  . TOTAL SHOULDER ARTHROPLASTY Right 04/06/2016   Procedure: RIGHT REVERSE TOTAL SHOULDER ARTHROPLASTY;  Surgeon: [name], MD;  Location: [place];  Service: Orthopedics;  Laterality: Right;  . TOTAL SHOULDER REVISION Right 12/22/2016   Procedure: RIGHT SHOULDER GLENOID AND HUMERAL COMPONENT REVISION;  Surgeon: [name], MD;  Location: [place];  Service: Orthopedics;  Laterality: Right;  . US ECHOCARDIOGRAPHY  12/15/2008   EF 60-65%  . US ECHOCARDIOGRAPHY  09/28/2004   EF 55-60%       Home Medications    Prior to Admission medications   Medication Sig Start Date End Date Taking? Authorizing Provider  benzonatate (TESSALON PERLES) 100 MG capsule Take 1 capsule (100  mg total) by mouth 3 (three) times daily as needed for cough. 10/12/20  Yes Sharhonda Atwood, Myrene Galas, MD  ondansetron (ZOFRAN ODT) 4 MG disintegrating tablet Take 1 tablet (4 mg total) by mouth every 8 (eight) hours as needed for nausea or vomiting. 10/12/20  Yes Kaeson Kleinert, Myrene Galas, MD  acyclovir (ZOVIRAX) 400 MG tablet Take 400 mg by mouth 3 (three) times daily as needed (For cold sores). . 10/05/16   [provider]  albuterol (VENTOLIN HFA) 108 (90 Base) MCG/ACT inhaler Inhale 2 puffs into the lungs every 6 (six) hours as needed. 08/06/20   [provider]  allopurinol (ZYLOPRIM) 300 MG tablet Take 300 mg by mouth daily as needed (for gout flares).     [provider]  aspirin EC 81 MG tablet Take 1 tablet (81 mg total) by mouth daily. 05/28/19   Martinique, Peter M, MD  atorvastatin (LIPITOR) 80 MG tablet Take 1 tablet (80 mg total) by mouth every evening. 08/17/16   Mancel Bale,  Rosalene Billings, NP  Fluticasone-Umeclidin-Vilant (TRELEGY ELLIPTA) 100-62.5-25 MCG/INH AEPB Inhale 1 puff into the lungs daily. 09/14/20   Olalere, Ernesto Rutherford, MD  Fluticasone-Umeclidin-Vilant (TRELEGY ELLIPTA) 100-62.5-25 MCG/INH AEPB Inhale 1 puff into the lungs daily. 09/14/20   Olalere, Cicero Duck A, MD  furosemide (LASIX) 20 MG tablet Take 1 tablet (20 mg total) by mouth daily. 06/15/20 06/10/21  Martinique, Peter M, MD  HYDROcodone-acetaminophen (NORCO/VICODIN) 5-325 MG tablet Take 1 tablet by mouth 3 (three) times daily as needed for moderate pain.  02/11/20   [provider]  LORazepam (ATIVAN) 0.5 MG tablet Take 0.5 mg by mouth 2 (two) times daily as needed for anxiety.  08/07/15   [provider]  metoprolol tartrate (LOPRESSOR) 25 MG tablet Take 25 mg by mouth 2 (two) times daily.    [provider]  pantoprazole (PROTONIX) 40 MG tablet Take 40 mg by mouth every evening.     [provider]    Family History Family History  Problem Relation Age of Onset  . Heart attack Father   . Heart  disease Brother   . Allergic rhinitis Neg Hx   . Angioedema Neg Hx   . Asthma Neg Hx   . Atopy Neg Hx   . Eczema Neg Hx   . Immunodeficiency Neg Hx   . Urticaria Neg Hx     Social History Social History   Tobacco Use  . Smoking status: Former Smoker    Quit date: 05/31/1989    Years since quitting: 31.3  . Smokeless tobacco: Never Used  . Tobacco comment: 08/15/2016 "hadn't smoked a carton of cigarettes all my life"  Vaping Use  . Vaping Use: Never used  Substance Use Topics  . Alcohol use: Yes    Alcohol/week: 0.0 standard drinks    Comment: "sometimes daily, sometimes weekly" beer  . Drug use: No     Allergies   Adhesive [tape] and Latex   Review of Systems Review of Systems  HENT: Positive for congestion and rhinorrhea.   Respiratory: Positive for cough, shortness of breath and wheezing. Negative for chest tightness.   Gastrointestinal: Positive for nausea. Negative for abdominal pain, diarrhea and vomiting.  Genitourinary: Negative.   Musculoskeletal: Negative.   Skin: Negative.   Neurological: Negative.      Physical Exam Triage Vital Signs ED Triage Vitals  Enc Vitals Group     BP 10/12/20 1419 (!) 160/95     Pulse Rate 10/12/20 1419 (!) 102     Resp 10/12/20 1419 19     Temp 10/12/20 1419 98.6 F (37 C)     Temp Source 10/12/20 1419 Oral     SpO2 10/12/20 1419 94 %     Weight --      Height --      Head Circumference --      Peak Flow --      Pain Score 10/12/20 1417 8     Pain Loc --      Pain Edu? --      Excl. in [place]? --    No data found.  Updated Vital Signs BP (!) 160/95   Pulse (!) 102   Temp 98.6 F (37 C) (Oral)   Resp 19   SpO2 94%   Visual Acuity Right Eye Distance:   Left Eye Distance:   Bilateral Distance:    Right Eye Near:   Left Eye Near:    Bilateral Near:     Physical Exam Vitals and nursing  note reviewed.  Constitutional:      General: He is in acute distress.     Appearance: He is ill-appearing. He is not  diaphoretic.  Cardiovascular:     Rate and Rhythm: Normal rate and regular rhythm.  Pulmonary:     Effort: Pulmonary effort is normal. No tachypnea or respiratory distress.     Breath sounds: Wheezing present. No decreased breath sounds, rhonchi or rales.  Abdominal:     General: Bowel sounds are normal.     Palpations: Abdomen is soft.  Musculoskeletal:        General: Normal range of motion.  Neurological:     Mental Status: He is alert.      UC Treatments / Results  Labs (all labs ordered are listed, but only abnormal results are displayed) Labs Reviewed  CBC WITH DIFFERENTIAL/PLATELET  BASIC METABOLIC PANEL    EKG   Radiology DG Chest 2 View  Result Date: 10/12/2020 CLINICAL DATA:  Shortness of breath. Cough. Wheezing. Patient reports positive COVID. EXAM: CHEST - 2 VIEW COMPARISON:  Radiograph 09/14/2020. FINDINGS: Post median sternotomy and CABG. Stable heart size and mediastinal contours. Chronic hyperinflation. There is new patchy opacity at the right lung base, likely in the lower lobe. No pulmonary edema. No pleural effusion or pneumothorax. Reverse right shoulder arthroplasty. Surgical hardware in the cervical spine. Surgical hardware in the lumbar spine is partially included. IMPRESSION: Patchy right lung base opacity, likely right lower lobe, suspicious for pneumonia. Electronically Signed   By: Keith Rake M.D.   On: 10/12/2020 15:16    Procedures Procedures (including critical care time)  Medications Ordered in UC Medications - No data to display  Initial Impression / Assessment and Plan / UC Course  I have reviewed the triage vital signs and the nursing notes.  Pertinent labs & imaging results that were available during my care of the patient were reviewed by me and considered in my medical decision making (see chart for details).     1.  COVID-19 pneumonia: Patient is outside the window for antivirals I recommend patient get monoclonal infusion  with the [facility] outpatient infusion center No indication for antibiotics Patient is recommended to return to urgent care if symptoms worsen. Tessalon Perles as needed for cough Zofran as needed for nausea/vomiting  Final Clinical Impressions(s) / UC Diagnoses   Final diagnoses:  COVID-19 virus infection   Discharge Instructions   None    ED Prescriptions    Medication Sig Dispense Auth. Provider   benzonatate (TESSALON PERLES) 100 MG capsule Take 1 capsule (100 mg total) by mouth 3 (three) times daily as needed for cough. 30 capsule Charmion Hapke, Myrene Galas, MD   ondansetron (ZOFRAN ODT) 4 MG disintegrating tablet Take 1 tablet (4 mg total) by mouth every 8 (eight) hours as needed for nausea or vomiting. 30 tablet Owynn Mosqueda, Myrene Galas, MD     PDMP not reviewed this encounter.   Chase Picket, MD 10/12/20 1550

## 2020-10-12 NOTE — Discharge Instructions (Addendum)
Increase oral fluid intake Take medications as prescribed We will set you up with outpatient infusion clinic to have bebtelovimab infusion.

## 2020-10-12 NOTE — ED Triage Notes (Signed)
Pt is present today with a cough, wheezing, SOB, fatigue, and watery eye. Pt states that he tested positive for Covid. Pt states that he is having a hard time breathing after a coughing episode. Pt states that he doesn't have any energy just feeling fatigue.

## 2020-10-13 ENCOUNTER — Telehealth (HOSPITAL_COMMUNITY): Payer: Self-pay | Admitting: Internal Medicine

## 2020-10-13 MED ORDER — HYDROCOD POLST-CPM POLST ER 10-8 MG/5ML PO SUER: 5.0000 mL | Freq: Two times a day (BID) | ORAL | 0 refills | Status: DC | PRN

## 2020-10-14 DIAGNOSIS — U071 COVID-19: Secondary | ICD-10-CM | POA: Diagnosis not present

## 2020-10-14 DIAGNOSIS — J441 Chronic obstructive pulmonary disease with (acute) exacerbation: Secondary | ICD-10-CM | POA: Diagnosis not present

## 2020-10-14 DIAGNOSIS — J1282 Pneumonia due to coronavirus disease 2019: Secondary | ICD-10-CM | POA: Diagnosis not present

## 2020-10-15 ENCOUNTER — Other Ambulatory Visit: Payer: Self-pay | Admitting: Cardiology

## 2020-10-30 DIAGNOSIS — I2510 Atherosclerotic heart disease of native coronary artery without angina pectoris: Secondary | ICD-10-CM | POA: Diagnosis not present

## 2020-10-30 DIAGNOSIS — I10 Essential (primary) hypertension: Secondary | ICD-10-CM | POA: Diagnosis not present

## 2020-10-30 DIAGNOSIS — K219 Gastro-esophageal reflux disease without esophagitis: Secondary | ICD-10-CM | POA: Diagnosis not present

## 2020-10-30 DIAGNOSIS — E785 Hyperlipidemia, unspecified: Secondary | ICD-10-CM | POA: Diagnosis not present

## 2020-12-08 ENCOUNTER — Other Ambulatory Visit: Payer: Self-pay

## 2020-12-08 MED ORDER — FUROSEMIDE 20 MG PO TABS: ORAL_TABLET | ORAL | 5 refills | Status: DC

## 2020-12-18 ENCOUNTER — Telehealth: Payer: Self-pay | Admitting: Cardiology

## 2020-12-18 NOTE — Telephone Encounter (Signed)
Spoke to patient . He states chest several chest pain for several days. Patient states he used NTG x 2 yesterday with one success   Today , patient states he is having chest pain  S.O.B , nausea. RN informed patient with active chest pain to go to [facility] ER- Patient states he will not go to the hospital he waited for 10 hours. Patient states he was on [place] ,now. RN informed patient it is the best for him to go to a ER to be evaluated. Patient wanted to know if  he can be seen today or next week     Informed patient not available when RN went informed patient . Patient said thanks an hung up

## 2020-12-18 NOTE — Telephone Encounter (Signed)
Pt c/o of Chest Pain: STAT if CP now or developed within 24 hours  1. Are you having CP right now? yes  2. Are you experiencing any other symptoms (ex. SOB, nausea, vomiting, sweating)? Sob, vomiting, dizziness  3. How long have you been experiencing CP? 4/5 days it has gotten worst  4. Is your CP continuous or coming and going? Coming and going   5. Have you taken Nitroglycerin? Out of it two last two yesterday ?

## 2020-12-20 ENCOUNTER — Encounter (HOSPITAL_COMMUNITY): Payer: Self-pay

## 2020-12-20 ENCOUNTER — Emergency Department (HOSPITAL_COMMUNITY): Payer: Medicare Other

## 2020-12-20 ENCOUNTER — Other Ambulatory Visit: Payer: Self-pay

## 2020-12-20 ENCOUNTER — Observation Stay (HOSPITAL_COMMUNITY)
Admission: EM | Admit: 2020-12-20 | Discharge: 2020-12-21 | Disposition: A | Discharge status: Home / Self Care | Payer: Medicare Other | Attending: Family Medicine | Admitting: Family Medicine

## 2020-12-20 DIAGNOSIS — Z8673 Personal history of transient ischemic attack (TIA), and cerebral infarction without residual deficits: Secondary | ICD-10-CM | POA: Insufficient documentation

## 2020-12-20 DIAGNOSIS — Z9861 Coronary angioplasty status: Secondary | ICD-10-CM | POA: Insufficient documentation

## 2020-12-20 DIAGNOSIS — Z79899 Other long term (current) drug therapy: Secondary | ICD-10-CM | POA: Diagnosis not present

## 2020-12-20 DIAGNOSIS — R0789 Other chest pain: Secondary | ICD-10-CM | POA: Diagnosis not present

## 2020-12-20 DIAGNOSIS — Z96651 Presence of right artificial knee joint: Secondary | ICD-10-CM | POA: Insufficient documentation

## 2020-12-20 DIAGNOSIS — I208 Other forms of angina pectoris: Secondary | ICD-10-CM | POA: Diagnosis not present

## 2020-12-20 DIAGNOSIS — I2510 Atherosclerotic heart disease of native coronary artery without angina pectoris: Secondary | ICD-10-CM | POA: Diagnosis not present

## 2020-12-20 DIAGNOSIS — R079 Chest pain, unspecified: Secondary | ICD-10-CM | POA: Diagnosis present

## 2020-12-20 DIAGNOSIS — I5022 Chronic systolic (congestive) heart failure: Secondary | ICD-10-CM | POA: Diagnosis not present

## 2020-12-20 DIAGNOSIS — Z951 Presence of aortocoronary bypass graft: Secondary | ICD-10-CM | POA: Insufficient documentation

## 2020-12-20 DIAGNOSIS — I130 Hypertensive heart and chronic kidney disease with heart failure and stage 1 through stage 4 chronic kidney disease, or unspecified chronic kidney disease: Secondary | ICD-10-CM | POA: Insufficient documentation

## 2020-12-20 DIAGNOSIS — Z7982 Long term (current) use of aspirin: Secondary | ICD-10-CM | POA: Diagnosis not present

## 2020-12-20 DIAGNOSIS — Z87891 Personal history of nicotine dependence: Secondary | ICD-10-CM | POA: Insufficient documentation

## 2020-12-20 DIAGNOSIS — N181 Chronic kidney disease, stage 1: Secondary | ICD-10-CM | POA: Diagnosis not present

## 2020-12-20 DIAGNOSIS — Z20822 Contact with and (suspected) exposure to covid-19: Secondary | ICD-10-CM | POA: Insufficient documentation

## 2020-12-20 DIAGNOSIS — Z96611 Presence of right artificial shoulder joint: Secondary | ICD-10-CM | POA: Diagnosis not present

## 2020-12-20 DIAGNOSIS — I25110 Atherosclerotic heart disease of native coronary artery with unstable angina pectoris: Secondary | ICD-10-CM | POA: Diagnosis not present

## 2020-12-20 DIAGNOSIS — Z91040 Latex allergy status: Secondary | ICD-10-CM | POA: Diagnosis not present

## 2020-12-20 DIAGNOSIS — I2089 Other forms of angina pectoris: Secondary | ICD-10-CM

## 2020-12-20 LAB — CBC WITH DIFFERENTIAL/PLATELET
Abs Immature Granulocytes: 0.03 10*3/uL (ref 0.00–0.07)
Basophils Absolute: 0.1 10*3/uL (ref 0.0–0.1)
Basophils Relative: 1 %
Eosinophils Absolute: 0.2 10*3/uL (ref 0.0–0.5)
Eosinophils Relative: 2 %
HCT: 44.6 % (ref 39.0–52.0)
Hemoglobin: 14.9 g/dL (ref 13.0–17.0)
Immature Granulocytes: 0 %
Lymphocytes Relative: 19 %
Lymphs Abs: 1.5 10*3/uL (ref 0.7–4.0)
MCH: 31.2 pg (ref 26.0–34.0)
MCHC: 33.4 g/dL (ref 30.0–36.0)
MCV: 93.3 fL (ref 80.0–100.0)
Monocytes Absolute: 1.1 10*3/uL — ABNORMAL HIGH (ref 0.1–1.0)
Monocytes Relative: 14 %
Neutro Abs: 5.1 10*3/uL (ref 1.7–7.7)
Neutrophils Relative %: 64 %
Platelets: 273 10*3/uL (ref 150–400)
RBC: 4.78 MIL/uL (ref 4.22–5.81)
RDW: 14.6 % (ref 11.5–15.5)
WBC: 7.9 10*3/uL (ref 4.0–10.5)
nRBC: 0 % (ref 0.0–0.2)

## 2020-12-20 LAB — D-DIMER, QUANTITATIVE: D-Dimer, Quant: 0.41 ug/mL-FEU (ref 0.00–0.50)

## 2020-12-20 LAB — COMPREHENSIVE METABOLIC PANEL
ALT: 14 U/L (ref 0–44)
AST: 15 U/L (ref 15–41)
Albumin: 3.9 g/dL (ref 3.5–5.0)
Alkaline Phosphatase: 69 U/L (ref 38–126)
Anion gap: 10 (ref 5–15)
BUN: 20 mg/dL (ref 8–23)
CO2: 20 mmol/L — ABNORMAL LOW (ref 22–32)
Calcium: 9.2 mg/dL (ref 8.9–10.3)
Chloride: 105 mmol/L (ref 98–111)
Creatinine, Ser: 1.36 mg/dL — ABNORMAL HIGH (ref 0.61–1.24)
GFR, Estimated: 53 mL/min — ABNORMAL LOW (ref >60)
Glucose, Bld: 118 mg/dL — ABNORMAL HIGH (ref 70–99)
Potassium: 4.2 mmol/L (ref 3.5–5.1)
Sodium: 135 mmol/L (ref 135–145)
Total Bilirubin: 1 mg/dL (ref 0.3–1.2)
Total Protein: 6.9 g/dL (ref 6.5–8.1)

## 2020-12-20 LAB — TROPONIN I (HIGH SENSITIVITY)
Troponin I (High Sensitivity): 7 ng/L (ref <18)
Troponin I (High Sensitivity): 7 ng/L (ref <18)

## 2020-12-20 LAB — BRAIN NATRIURETIC PEPTIDE: B Natriuretic Peptide: 178.9 pg/mL — ABNORMAL HIGH (ref 0.0–100.0)

## 2020-12-20 MED ORDER — FLUTICASONE FUROATE-VILANTEROL 100-25 MCG/INH IN AEPB
1.0000 | INHALATION_SPRAY | Freq: Every day | RESPIRATORY_TRACT | Status: DC
  Administered 2020-12-20 – 2020-12-21 (×2): 1 via RESPIRATORY_TRACT
  Filled 2020-12-20: qty 28

## 2020-12-20 MED ORDER — ALLOPURINOL 100 MG PO TABS
300.0000 mg | ORAL_TABLET | Freq: Every day | ORAL | Status: DC | PRN
  Filled 2020-12-20: qty 1

## 2020-12-20 MED ORDER — NITROGLYCERIN 0.4 MG SL SUBL
0.4000 mg | SUBLINGUAL_TABLET | SUBLINGUAL | Status: DC | PRN
  Administered 2020-12-20: 0.4 mg via SUBLINGUAL
  Filled 2020-12-20: qty 1

## 2020-12-20 MED ORDER — FUROSEMIDE 20 MG PO TABS
40.0000 mg | ORAL_TABLET | Freq: Every day | ORAL | Status: DC
  Administered 2020-12-20: 40 mg via ORAL
  Filled 2020-12-20: qty 2

## 2020-12-20 MED ORDER — ATORVASTATIN CALCIUM 40 MG PO TABS
80.0000 mg | ORAL_TABLET | Freq: Every evening | ORAL | Status: DC
  Administered 2020-12-20: 80 mg via ORAL
  Filled 2020-12-20: qty 2

## 2020-12-20 MED ORDER — ACETAMINOPHEN 325 MG PO TABS
650.0000 mg | ORAL_TABLET | ORAL | Status: DC | PRN
  Administered 2020-12-20 – 2020-12-21 (×2): 650 mg via ORAL
  Filled 2020-12-20 (×2): qty 2

## 2020-12-20 MED ORDER — ASPIRIN 325 MG PO TABS
325.0000 mg | ORAL_TABLET | Freq: Every day | ORAL | Status: DC
  Administered 2020-12-20: 325 mg via ORAL
  Filled 2020-12-20: qty 1

## 2020-12-20 MED ORDER — ONDANSETRON HCL 4 MG/2ML IJ SOLN
4.0000 mg | Freq: Four times a day (QID) | INTRAMUSCULAR | Status: DC | PRN
  Administered 2020-12-21: 4 mg via INTRAVENOUS
  Filled 2020-12-20: qty 2

## 2020-12-20 MED ORDER — ENOXAPARIN SODIUM 40 MG/0.4ML IJ SOSY
40.0000 mg | PREFILLED_SYRINGE | INTRAMUSCULAR | Status: DC
  Administered 2020-12-20: 40 mg via SUBCUTANEOUS
  Filled 2020-12-20: qty 0.4

## 2020-12-20 MED ORDER — LORAZEPAM 1 MG PO TABS: 0.5000 mg | ORAL_TABLET | Freq: Two times a day (BID) | ORAL | Status: DC | PRN

## 2020-12-20 MED ORDER — ASPIRIN EC 81 MG PO TBEC: 81.0000 mg | DELAYED_RELEASE_TABLET | Freq: Every day | ORAL | Status: DC

## 2020-12-20 MED ORDER — SODIUM CHLORIDE 0.9 % IV BOLUS
500.0000 mL | Freq: Once | INTRAVENOUS | Status: AC
  Administered 2020-12-20: 500 mL via INTRAVENOUS

## 2020-12-20 MED ORDER — SODIUM CHLORIDE 0.9 % IV SOLN: INTRAVENOUS | Status: AC

## 2020-12-20 MED ORDER — FLUTICASONE-UMECLIDIN-VILANT 100-62.5-25 MCG/INH IN AEPB: 1.0000 | INHALATION_SPRAY | Freq: Every day | RESPIRATORY_TRACT | Status: DC

## 2020-12-20 MED ORDER — METOPROLOL TARTRATE 25 MG PO TABS
25.0000 mg | ORAL_TABLET | Freq: Two times a day (BID) | ORAL | Status: DC
  Administered 2020-12-20: 25 mg via ORAL
  Filled 2020-12-20: qty 1

## 2020-12-20 MED ORDER — HYDROCODONE-ACETAMINOPHEN 5-325 MG PO TABS
1.0000 | ORAL_TABLET | Freq: Three times a day (TID) | ORAL | Status: DC | PRN
  Administered 2020-12-21 (×2): 1 via ORAL
  Filled 2020-12-20: qty 1

## 2020-12-20 MED ORDER — ALBUTEROL SULFATE HFA 108 (90 BASE) MCG/ACT IN AERS: 2.0000 | INHALATION_SPRAY | Freq: Four times a day (QID) | RESPIRATORY_TRACT | Status: DC | PRN

## 2020-12-20 MED ORDER — PANTOPRAZOLE SODIUM 40 MG PO TBEC
40.0000 mg | DELAYED_RELEASE_TABLET | Freq: Every evening | ORAL | Status: DC
  Administered 2020-12-20: 40 mg via ORAL
  Filled 2020-12-20: qty 1

## 2020-12-20 MED ORDER — UMECLIDINIUM BROMIDE 62.5 MCG/INH IN AEPB
1.0000 | INHALATION_SPRAY | Freq: Every day | RESPIRATORY_TRACT | Status: DC
  Administered 2020-12-20 – 2020-12-21 (×2): 1 via RESPIRATORY_TRACT
  Filled 2020-12-20: qty 7

## 2020-12-20 NOTE — ED Triage Notes (Signed)
Patient complains of chest pain with radiation to left arm intermittently x 4 days. Patient states that he has sl ntg that is old and did not take. Patient alert and oriented, no SOB, no diaphoresis.

## 2020-12-20 NOTE — ED Provider Notes (Signed)
Emergency Medicine Provider Triage Evaluation Note  Jake Dunn , a 80 y.o. male  was evaluated in triage.  Pt complains of gradual onset, constant, waxing and waning, left sided chest pain that began 2 days ago. Pt reports it has worsened over the past 2 days and yesterday he began feeling nauseated with dry heaves. He also complains of pain going down his left arm intermittently. He reports hx of CABG in the past s/2 MI and states this feels similar. He did try to take NTG throughout the weekend but states it was expired and did not help much.  Review of Systems  Positive: + chest pain, N, left arm pain Negative: - SOB  Physical Exam  BP (!) 176/90 (BP Location: Right Arm)   Pulse 71   Temp 98.6 F (37 C) (Oral)   Resp 20   SpO2 99%  Gen:   Awake, no distress   Resp:  Normal effort  MSK:   Moves extremities without difficulty  Other:    Medical Decision Making  Medically screening exam initiated at 11:35 AM.  Appropriate orders placed.  Jake Dunn was informed that the remainder of the evaluation will be completed by another provider, this initial triage assessment does not replace that evaluation, and the importance of remaining in the ED until their evaluation is complete.  ACS workup started. Concerning story.    Eustaquio Maize, PA-C 12/20/20 [place], [place], DO 12/20/20 [phone]

## 2020-12-20 NOTE — H&P (Signed)
History and Physical    Jake Dunn [id number] DOB: April 07, 1941 DOA: 12/20/2020  PCP: Harlan Stains, MD (Confirm with patient/family/NH records and if not entered, this has to be entered at [facility] point of entry) Patient coming from: Home  I have personally briefly reviewed patient's old medical records in [place]  Chief Complaint: Chest pain  HPI: Jake Dunn is a 80 y.o. male with medical history significant of CAD s/p CABG, chronic systolic CHF (LVEF [id number] on stress test 05/2020), HTN, HLD, asthma/COPD, presented with chest pains.  First episode started on Thursday while doing some gardening work, suddenly feels pressure-like chest pain associated with sweating and shortness of breath, chest pain was localized, nonradiating, lasted about 3 minutes, he tried home stock of nitroglycerin but found out that it was expired.  He checked his blood pressure during the episode and  SBP> 170s, which he said is abnormal as compared to his blood pressure is fairly controlled at baseline.  He called his cardiology over the phone who recommended he came into the hospital.  Due to financial reasons he did not come into the hospital that day.  Friday, he had 3 other episodes of chest pains each episode last about 5 minutes with similar nature of chest pains.  And this morning he had another episode when he felt left-sided neck and shoulder and left upper arm numbness.  He had a normal stress test January this year.  ED Course: Troponin 7>7, EKG showed no significant ST changes.  Patient had another episode in the ED, when he was given 0.4 mg sublingual nitro which relieved the chest pain.  Review of Systems: As per HPI otherwise 14 point review of systems negative.    Past Medical History:  Diagnosis Date   Adenomatous polyp    Anxiety    Arthritis    "knees, ankles, shoulders" (08/15/2016)   CAD (coronary artery disease)    s/p cath in 2014 showing significant stenosis in the diagonal side  branches and in the RV marginal branch. These vessels were small and diffusely diseased. He is managed medically. 08/16/16 Cath, no disease progression   Chronic lower back pain    Disc disease, degenerative, cervical    GERD (gastroesophageal reflux disease)    Gout    Hyperlipidemia    Hypertension    Osteoarthritis    Renal cell carcinoma    left kidney removal   Stroke [facility])    MINI STROKE 15+ YRS AGO, no residual   Stroke ([place])    "I've had 2 or 3"; denies residual on 08/15/2016   Syncope and collapse     Past Surgical History:  Procedure Laterality Date   ANTERIOR CERVICAL DECOMP/DISCECTOMY FUSION  02/2004; 04/2005   [name] 09/20/2010; [name] [id number]   APPLICATION OF ROBOTIC ASSISTANCE FOR SPINAL PROCEDURE N/A 01/02/2018   Procedure: APPLICATION OF ROBOTIC ASSISTANCE FOR SPINAL PROCEDURE;  Surgeon: Kristeen Miss, MD;  Location: [place];  Service: Neurosurgery;  Laterality: N/A;   BACK SURGERY     CARDIAC CATHETERIZATION  12/14/2008   ef 50-55%. SHOWED SIGNIFICANT STENOSIS AND TO DIAGONAL SIDE BRANCHES AND IN THE RIGHT VENTRICULAR MARGINAL BRANCH. THESE VESSELS WERE SMALL AND DIFFUSELY DISEASED   CARDIOVASCULAR STRESS TEST  12/10/2009   EF 61%. EKG negative. Mild peri ischemia. Managed medically.    CARPAL TUNNEL RELEASE Right 11/2005   [name] 09/20/2010   CARPAL TUNNEL RELEASE Left 06/2007   [name] [id number]/132012   COLONOSCOPY  11/2004   [name] 09/20/2010  COLONOSCOPY W/ BIOPSIES AND POLYPECTOMY  09/2002   [name] 09/20/2010   CORONARY ARTERY BYPASS GRAFT N/A 04/02/2019   Procedure: CORONARY ARTERY BYPASS GRAFTING (CABG) using LIMA to LAD; Endoscopically harvested right greater saphenous vein to the PLB, Ramus, and Diag 2.;  Surgeon: Lajuana Matte, MD;  Location: [place];  Service: Open Heart Surgery;  Laterality: N/A;   INCISION AND DRAINAGE ABSCESS Right 10/18/2016   Procedure: INCISION AND DRAINAGE ABSCESS RIGHT SHOULDER;  Surgeon: [name], MD;  Location: [place];  Service: Orthopedics;  Laterality: Right;   INCISION AND DRAINAGE OF WOUND Right 08/2005   shoulder/notes 09/20/2010   IR FLUORO GUIDE CV LINE LEFT  10/19/2016   IR US GUIDE VASC ACCESS LEFT  10/19/2016   IRRIGATION AND DEBRIDEMENT SHOULDER Right 12/22/2016   Procedure: IRRIGATION AND DEBRIDEMENT RIGHT SHOULDER;  Surgeon: [name], MD;  Location: [place];  Service: Orthopedics;  Laterality: Right;   JOINT REPLACEMENT     LEFT HEART CATH AND CORONARY ANGIOGRAPHY N/A 08/16/2016   Procedure: Left Heart Cath and Coronary Angiography;  Surgeon: [name], MD;  Location: [place] CV LAB;  Service: Cardiovascular;  Laterality: N/A;   LEFT HEART CATH AND CORONARY ANGIOGRAPHY N/A 03/26/2019   Procedure: LEFT HEART CATH AND CORONARY ANGIOGRAPHY;  Surgeon: Martinique, Peter M, MD;  Location: [place] CV LAB;  Service: Cardiovascular;  Laterality: N/A;   LEFT HEART CATHETERIZATION WITH CORONARY ANGIOGRAM N/A 08/09/2012   Procedure: LEFT HEART CATHETERIZATION WITH CORONARY ANGIOGRAM;  Surgeon: Peter M Martinique, MD;  Location: [facility] CATH LAB;  Service: Cardiovascular;  Laterality: N/A;   LUMBAR FUSION     NEPHRECTOMY Left early 2000s   REPLACEMENT TOTAL KNEE Right 08/2008   [name] 09/07/2010   SHOULDER ARTHROSCOPY WITH ROTATOR CUFF REPAIR Right 06/2005   [name] 09/20/2010   TOTAL KNEE ARTHROPLASTY  06/08/2011   Procedure: TOTAL KNEE ARTHROPLASTY;  Surgeon: [name], MD;  Location: [place];  Service: Orthopedics;  Laterality: Left;   TOTAL SHOULDER ARTHROPLASTY Right 04/06/2016   Procedure: RIGHT REVERSE TOTAL SHOULDER ARTHROPLASTY;  Surgeon: [name], MD;  Location: [place];  Service: Orthopedics;  Laterality: Right;   TOTAL SHOULDER REVISION Right 12/22/2016   Procedure: RIGHT SHOULDER GLENOID AND HUMERAL COMPONENT REVISION;  Surgeon: [name], MD;  Location: [place];  Service: Orthopedics;  Laterality: Right;   US ECHOCARDIOGRAPHY  12/15/2008   EF 60-65%   US  ECHOCARDIOGRAPHY  09/28/2004   EF 55-60%     reports that he quit smoking about 31 years ago. He has never used smokeless tobacco. He reports current alcohol use. He reports that he does not use drugs.  Allergies  Allergen Reactions   Adhesive [Tape] Rash    Paper tape okay   Latex Rash    Family History  Problem Relation Age of Onset   Heart attack Father    Heart disease Brother    Allergic rhinitis Neg Hx    Angioedema Neg Hx    Asthma Neg Hx    Atopy Neg Hx    Eczema Neg Hx    Immunodeficiency Neg Hx    Urticaria Neg Hx      Prior to Admission medications   Medication Sig Start Date End Date Taking? Authorizing Provider  acyclovir (ZOVIRAX) 400 MG tablet Take 400 mg by mouth 3 (three) times daily as needed (For cold sores). . 10/05/16  Yes [provider]  albuterol (VENTOLIN HFA) 108 (90 Base)  MCG/ACT inhaler Inhale 2 puffs into the lungs every 6 (six) hours as needed for wheezing or shortness of breath. 08/06/20  Yes [provider]  allopurinol (ZYLOPRIM) 300 MG tablet Take 300 mg by mouth daily as needed (for gout flares).    Yes [provider]  aspirin EC 81 MG tablet Take 1 tablet (81 mg total) by mouth daily. 05/28/19  Yes Martinique, Peter M, MD  atorvastatin (LIPITOR) 80 MG tablet Take 1 tablet (80 mg total) by mouth every evening. 08/17/16  Yes Reino Bellis B, NP  furosemide (LASIX) 20 MG tablet TAKE 1 TABLET(20 MG) BY MOUTH DAILY Patient taking differently: Take 40 mg by mouth daily. 12/08/20  Yes Martinique, Peter M, MD  HYDROcodone-acetaminophen (NORCO/VICODIN) 5-325 MG tablet Take 1 tablet by mouth 3 (three) times daily as needed for moderate pain.  02/11/20  Yes [provider]  LORazepam (ATIVAN) 0.5 MG tablet Take 0.5 mg by mouth 2 (two) times daily as needed for anxiety.  08/07/15  Yes [provider]  metoprolol tartrate (LOPRESSOR) 25 MG tablet Take 25 mg by mouth 2 (two) times daily.   Yes [provider]   pantoprazole (PROTONIX) 40 MG tablet Take 40 mg by mouth every evening.    Yes [provider]  benzonatate (TESSALON PERLES) 100 MG capsule Take 1 capsule (100 mg total) by mouth 3 (three) times daily as needed for cough. Patient not taking: Reported on 12/20/2020 10/12/20   Chase Picket, MD  chlorpheniramine-HYDROcodone [facility] PENNKINETIC ER) 10-8 MG/5ML SUER Take 5 mLs by mouth every 12 (twelve) hours as needed for cough. Patient not taking: No sig reported 10/13/20   Chase Picket, MD  Fluticasone-Umeclidin-Vilant (TRELEGY ELLIPTA) 100-62.5-25 MCG/INH AEPB Inhale 1 puff into the lungs daily. 09/14/20   Olalere, Ernesto Rutherford, MD  Fluticasone-Umeclidin-Vilant (TRELEGY ELLIPTA) 100-62.5-25 MCG/INH AEPB Inhale 1 puff into the lungs daily. Patient not taking: Reported on 12/20/2020 09/14/20   Laurin Coder, MD  ondansetron (ZOFRAN ODT) 4 MG disintegrating tablet Take 1 tablet (4 mg total) by mouth every 8 (eight) hours as needed for nausea or vomiting. Patient not taking: No sig reported 10/12/20   Chase Picket, MD    Physical Exam: Vitals:   12/20/20 1600 12/20/20 1630 12/20/20 1700 12/20/20 1715  BP: 135/85 (!) 150/93 (!) 154/83 (!) 161/92  Pulse: (!) 57 (!) 59 60 (!) 58  Resp: '18 16 16 17  '$ Temp:      TempSrc:      SpO2: 95% 98% 97% 95%    Constitutional: NAD, calm, comfortable Vitals:   12/20/20 1600 12/20/20 1630 12/20/20 1700 12/20/20 1715  BP: 135/85 (!) 150/93 (!) 154/83 (!) 161/92  Pulse: (!) 57 (!) 59 60 (!) 58  Resp: '18 16 16 17  '$ Temp:      TempSrc:      SpO2: 95% 98% 97% 95%   Eyes: PERRL, lids and conjunctivae normal ENMT: Mucous membranes are moist. Posterior pharynx clear of any exudate or lesions.Normal dentition.  Neck: normal, supple, no masses, no thyromegaly Respiratory: clear to auscultation bilaterally, no wheezing, no crackles. Normal respiratory effort. No accessory muscle use.  Cardiovascular: Regular rate and rhythm, no murmurs / rubs /  gallops. No extremity edema. 2+ pedal pulses. No carotid bruits.  Abdomen: no tenderness, no masses palpated. No hepatosplenomegaly. Bowel sounds positive.  Musculoskeletal: no clubbing / cyanosis. No joint deformity upper and lower extremities. Good ROM, no contractures. Normal muscle tone.  Skin: no rashes, lesions,  ulcers. No induration Neurologic: CN 2-12 grossly intact. Sensation intact, DTR normal. Strength 5/5 in all 4.  Psychiatric: Normal judgment and insight. Alert and oriented x 3. Normal mood.     Labs on Admission: I have personally reviewed following labs and imaging studies  CBC: Recent Labs  Lab 12/20/20 1136  WBC 7.9  NEUTROABS 5.1  HGB 14.9  HCT 44.6  MCV 93.3  PLT [id number]   Basic Metabolic Panel: Recent Labs  Lab 12/20/20 1136  NA 135  K 4.2  CL 105  CO2 20*  GLUCOSE 118*  BUN 20  CREATININE 1.36*  CALCIUM 9.2   GFR: CrCl cannot be calculated (Unknown ideal weight.). Liver Function Tests: Recent Labs  Lab 12/20/20 1136  AST 15  ALT 14  ALKPHOS 69  BILITOT 1.0  PROT 6.9  ALBUMIN 3.9   No results for input(s): LIPASE, AMYLASE in the last 168 hours. No results for input(s): AMMONIA in the last 168 hours. Coagulation Profile: No results for input(s): INR, PROTIME in the last 168 hours. Cardiac Enzymes: No results for input(s): CKTOTAL, CKMB, CKMBINDEX, TROPONINI in the last 168 hours. BNP (last 3 results) No results for input(s): PROBNP in the last 8760 hours. HbA1C: No results for input(s): HGBA1C in the last 72 hours. CBG: No results for input(s): GLUCAP in the last 168 hours. Lipid Profile: No results for input(s): CHOL, HDL, LDLCALC, TRIG, CHOLHDL, LDLDIRECT in the last 72 hours. Thyroid Function Tests: No results for input(s): TSH, T4TOTAL, FREET4, T3FREE, THYROIDAB in the last 72 hours. Anemia Panel: No results for input(s): VITAMINB12, FOLATE, FERRITIN, TIBC, IRON, RETICCTPCT in the last 72 hours. Urine analysis:    Component Value  Date/Time   COLORURINE STRAW (A) 03/31/2019 1823   APPEARANCEUR CLEAR 03/31/2019 1823   LABSPEC 1.010 03/31/2019 1823   PHURINE 5.0 03/31/2019 1823   GLUCOSEU NEGATIVE 03/31/2019 1823   HGBUR NEGATIVE 03/31/2019 1823   BILIRUBINUR NEGATIVE 03/31/2019 1823   KETONESUR NEGATIVE 03/31/2019 1823   PROTEINUR NEGATIVE 03/31/2019 1823   UROBILINOGEN 0.2 06/02/2011 1327   NITRITE NEGATIVE 03/31/2019 1823   LEUKOCYTESUR NEGATIVE 03/31/2019 1823    Radiological Exams on Admission: DG Chest 2 View  Result Date: 12/20/2020 CLINICAL DATA:  Chest pain. EXAM: CHEST - 2 VIEW COMPARISON:  None. FINDINGS: The heart size and mediastinal contours are within normal limits. Both lungs are clear. The visualized skeletal structures are unremarkable. IMPRESSION: No active cardiopulmonary disease. Electronically Signed   By: Dorise Bullion III M.D.   On: 12/20/2020 12:47    EKG: Independently reviewed.  Chronic ST changes on V1-V2.  Assessment/Plan Active Problems:   Angina pectoris (HCC)   Chest pain  (please populate well all problems here in Problem List. (For example, if patient is on BP meds at home and you resume or decide to hold them, it is a problem that needs to be her. Same for CAD, COPD, HLD and so on)  Angina -Discussed with on-call cardiology fellow, who will evaluate patient this evening emergently.  And proposed cardiac cath as of now. -Given the patient symptoms resolved and responded to nitroglycerin, will not start ACS meds at this time. -Continue aspirin, beta-blocker and statin.  Chronic systolic CHF -Euvolemic, on IV fluids for kidney protection, restart Lasix tomorrow.  HTN -Controlled, continue Lasix and beta-blocker  CAD -As above  CKD stage I -Slight elevation of creatinine level, gentle hydration for nephro protection for incoming cardiac cath/IV contrast  DVT prophylaxis: Lovenox Code Status: Full code Family  Communication: None at bedside Disposition Plan: Expect  less than 2 midnight hospital stay Consults called: Cardiology Admission status: Telemetry observation   Lequita Halt MD [facility] Pager [phone]  12/20/2020, 5:46 PM

## 2020-12-20 NOTE — ED Notes (Signed)
Pt in room A&O x4. Call bell fixed. Pt voiced having a HA. PRN medications discussed with pt. Pt calm and cooperative with staff. Updated on plan of care in the ED, bed delay, and provided a warm blanket.

## 2020-12-20 NOTE — ED Provider Notes (Signed)
[facility] EMERGENCY DEPARTMENT Provider Note   CSN: [id number] Arrival date & time: 12/20/20  1117     History No chief complaint on file.   Jake Dunn is a 80 y.o. male.  Patient presented to ER with history as below secondary to chest discomfort.  Reports onset of symptoms on Thursday.  They have been ongoing since then.  Intermittent in nature. Worsened with exertion, deep inspiration.  Also a/w lightheadedness, diaphoresis, nausea without emesis. No recent medication changes. He has history of CABG multiple years ago and follows with cardiology. He is currently symptomatic but it has mildly improved. Mild dyspnea on deep inspiration. No abdominal pain, no change to bowel/bladder function.  Chest pain described as tightness, pressure, radiating down left arm and to left side of neck intermittently.   The history is provided by the patient. No language interpreter was used.      Past Medical History:  Diagnosis Date   Adenomatous polyp    Anxiety    Arthritis    "knees, ankles, shoulders" (08/15/2016)   CAD (coronary artery disease)    s/p cath in 2014 showing significant stenosis in the diagonal side branches and in the RV marginal branch. These vessels were small and diffusely diseased. He is managed medically. 08/16/16 Cath, no disease progression   Chronic lower back pain    Disc disease, degenerative, cervical    GERD (gastroesophageal reflux disease)    Gout    Hyperlipidemia    Hypertension    Osteoarthritis    Renal cell carcinoma    left kidney removal   Stroke [facility])    MINI STROKE 15+ YRS AGO, no residual   Stroke ([place])    "I've had 2 or 3"; denies residual on 08/15/2016   Syncope and collapse     Patient Active Problem List   Diagnosis Date Noted   S/P CABG x 4 04/02/2019   CAD, multiple vessel 03/29/2019   Crescendo angina ([place]) 03/29/2019   Spinal stenosis of lumbar region with radiculopathy 01/02/2018   Antibiotic drug intolerance  03/01/2017   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[place]) 12/22/2016   Medication monitoring encounter 11/15/2016   Abscess of right shoulder 10/18/2016   Unstable angina ([place]) 08/15/2016   Localized primary osteoarthritis of right shoulder region 04/06/2016   Coughing/wheezing 08/17/2015   Angioedema 08/17/2015   Hypertension    CAD (coronary artery disease)    Stroke [facility])    Hyperlipidemia    Dyspnea 08/07/2012   Pre-operative clearance 06/01/2011    Past Surgical History:  Procedure Laterality Date   ANTERIOR CERVICAL DECOMP/DISCECTOMY FUSION  02/2004; 04/2005   [name] 09/20/2010; [name] [id number]   APPLICATION OF ROBOTIC ASSISTANCE FOR SPINAL PROCEDURE N/A 01/02/2018   Procedure: APPLICATION OF ROBOTIC ASSISTANCE FOR SPINAL PROCEDURE;  Surgeon: Kristeen Miss, MD;  Location: [place];  Service: Neurosurgery;  Laterality: N/A;   BACK SURGERY     CARDIAC CATHETERIZATION  12/14/2008   ef 50-55%. SHOWED SIGNIFICANT STENOSIS AND TO DIAGONAL SIDE BRANCHES AND IN THE RIGHT VENTRICULAR MARGINAL BRANCH. THESE VESSELS WERE SMALL AND DIFFUSELY DISEASED   CARDIOVASCULAR STRESS TEST  12/10/2009   EF 61%. EKG negative. Mild peri ischemia. Managed medically.    CARPAL TUNNEL RELEASE Right 11/2005   [name] 09/20/2010   CARPAL TUNNEL RELEASE Left 06/2007   [name] [id number]/132012   COLONOSCOPY  11/2004   [name] 09/20/2010   COLONOSCOPY W/ BIOPSIES AND POLYPECTOMY  09/2002   [name] 09/20/2010   CORONARY ARTERY BYPASS GRAFT N/A  04/02/2019   Procedure: CORONARY ARTERY BYPASS GRAFTING (CABG) using LIMA to LAD; Endoscopically harvested right greater saphenous vein to the PLB, Ramus, and Diag 2.;  Surgeon: Lajuana Matte, MD;  Location: [place];  Service: Open Heart Surgery;  Laterality: N/A;   INCISION AND DRAINAGE ABSCESS Right 10/18/2016   Procedure: INCISION AND DRAINAGE ABSCESS RIGHT SHOULDER;  Surgeon: [name], MD;  Location: [place];  Service: Orthopedics;  Laterality: Right;   INCISION  AND DRAINAGE OF WOUND Right 08/2005   shoulder/notes 09/20/2010   IR FLUORO GUIDE CV LINE LEFT  10/19/2016   IR US GUIDE VASC ACCESS LEFT  10/19/2016   IRRIGATION AND DEBRIDEMENT SHOULDER Right 12/22/2016   Procedure: IRRIGATION AND DEBRIDEMENT RIGHT SHOULDER;  Surgeon: [name], MD;  Location: [place];  Service: Orthopedics;  Laterality: Right;   JOINT REPLACEMENT     LEFT HEART CATH AND CORONARY ANGIOGRAPHY N/A 08/16/2016   Procedure: Left Heart Cath and Coronary Angiography;  Surgeon: [name], MD;  Location: [place] CV LAB;  Service: Cardiovascular;  Laterality: N/A;   LEFT HEART CATH AND CORONARY ANGIOGRAPHY N/A 03/26/2019   Procedure: LEFT HEART CATH AND CORONARY ANGIOGRAPHY;  Surgeon: Martinique, Peter M, MD;  Location: [place] CV LAB;  Service: Cardiovascular;  Laterality: N/A;   LEFT HEART CATHETERIZATION WITH CORONARY ANGIOGRAM N/A 08/09/2012   Procedure: LEFT HEART CATHETERIZATION WITH CORONARY ANGIOGRAM;  Surgeon: Peter M Martinique, MD;  Location: [facility] CATH LAB;  Service: Cardiovascular;  Laterality: N/A;   LUMBAR FUSION     NEPHRECTOMY Left early 2000s   REPLACEMENT TOTAL KNEE Right 08/2008   [name] 09/07/2010   SHOULDER ARTHROSCOPY WITH ROTATOR CUFF REPAIR Right 06/2005   [name] 09/20/2010   TOTAL KNEE ARTHROPLASTY  06/08/2011   Procedure: TOTAL KNEE ARTHROPLASTY;  Surgeon: [name], MD;  Location: [place];  Service: Orthopedics;  Laterality: Left;   TOTAL SHOULDER ARTHROPLASTY Right 04/06/2016   Procedure: RIGHT REVERSE TOTAL SHOULDER ARTHROPLASTY;  Surgeon: [name], MD;  Location: [place];  Service: Orthopedics;  Laterality: Right;   TOTAL SHOULDER REVISION Right 12/22/2016   Procedure: RIGHT SHOULDER GLENOID AND HUMERAL COMPONENT REVISION;  Surgeon: [name], MD;  Location: [place];  Service: Orthopedics;  Laterality: Right;   US ECHOCARDIOGRAPHY  12/15/2008   EF 60-65%   US ECHOCARDIOGRAPHY  09/28/2004   EF 55-60%       Family History  Problem Relation  Age of Onset   Heart attack Father    Heart disease Brother    Allergic rhinitis Neg Hx    Angioedema Neg Hx    Asthma Neg Hx    Atopy Neg Hx    Eczema Neg Hx    Immunodeficiency Neg Hx    Urticaria Neg Hx     Social History   Tobacco Use   Smoking status: Former    Types: Cigarettes    Quit date: 05/31/1989    Years since quitting: 31.5   Smokeless tobacco: Never   Tobacco comments:    08/15/2016 "hadn't smoked a carton of cigarettes all my life"  Vaping Use   Vaping Use: Never used  Substance Use Topics   Alcohol use: Yes    Alcohol/week: 0.0 standard drinks    Comment: "sometimes daily, sometimes weekly" beer   Drug use: No    Home Medications Prior to Admission medications   Medication Sig Start Date End Date Taking? Authorizing Provider  acyclovir (ZOVIRAX) 400 MG tablet Take  400 mg by mouth 3 (three) times daily as needed (For cold sores). . 10/05/16  Yes [provider]  albuterol (VENTOLIN HFA) 108 (90 Base) MCG/ACT inhaler Inhale 2 puffs into the lungs every 6 (six) hours as needed for wheezing or shortness of breath. 08/06/20  Yes [provider]  allopurinol (ZYLOPRIM) 300 MG tablet Take 300 mg by mouth daily as needed (for gout flares).    Yes [provider]  aspirin EC 81 MG tablet Take 1 tablet (81 mg total) by mouth daily. 05/28/19  Yes Martinique, Peter M, MD  atorvastatin (LIPITOR) 80 MG tablet Take 1 tablet (80 mg total) by mouth every evening. 08/17/16  Yes Reino Bellis B, NP  furosemide (LASIX) 20 MG tablet TAKE 1 TABLET(20 MG) BY MOUTH DAILY Patient taking differently: Take 40 mg by mouth daily. 12/08/20  Yes Martinique, Peter M, MD  HYDROcodone-acetaminophen (NORCO/VICODIN) 5-325 MG tablet Take 1 tablet by mouth 3 (three) times daily as needed for moderate pain.  02/11/20  Yes [provider]  LORazepam (ATIVAN) 0.5 MG tablet Take 0.5 mg by mouth 2 (two) times daily as needed for anxiety.  08/07/15  Yes [provider]   metoprolol tartrate (LOPRESSOR) 25 MG tablet Take 25 mg by mouth 2 (two) times daily.   Yes [provider]  pantoprazole (PROTONIX) 40 MG tablet Take 40 mg by mouth every evening.    Yes [provider]  benzonatate (TESSALON PERLES) 100 MG capsule Take 1 capsule (100 mg total) by mouth 3 (three) times daily as needed for cough. Patient not taking: Reported on 12/20/2020 10/12/20   Chase Picket, MD  chlorpheniramine-HYDROcodone [facility] PENNKINETIC ER) 10-8 MG/5ML SUER Take 5 mLs by mouth every 12 (twelve) hours as needed for cough. Patient not taking: No sig reported 10/13/20   Chase Picket, MD  Fluticasone-Umeclidin-Vilant (TRELEGY ELLIPTA) 100-62.5-25 MCG/INH AEPB Inhale 1 puff into the lungs daily. 09/14/20   Olalere, Ernesto Rutherford, MD  Fluticasone-Umeclidin-Vilant (TRELEGY ELLIPTA) 100-62.5-25 MCG/INH AEPB Inhale 1 puff into the lungs daily. Patient not taking: Reported on 12/20/2020 09/14/20   Laurin Coder, MD  ondansetron (ZOFRAN ODT) 4 MG disintegrating tablet Take 1 tablet (4 mg total) by mouth every 8 (eight) hours as needed for nausea or vomiting. Patient not taking: No sig reported 10/12/20   Lamptey, Myrene Galas, MD    Allergies    Adhesive [tape] and Latex  Review of Systems   Review of Systems  Constitutional:  Positive for diaphoresis. Negative for chills and fever.  HENT:  Negative for facial swelling and trouble swallowing.   Eyes:  Negative for photophobia and visual disturbance.  Respiratory:  Positive for shortness of breath. Negative for cough.   Cardiovascular:  Positive for chest pain. Negative for palpitations.  Gastrointestinal:  Negative for abdominal pain, nausea and vomiting.  Endocrine: Negative for polydipsia and polyuria.  Genitourinary:  Negative for difficulty urinating and hematuria.  Musculoskeletal:  Negative for gait problem and joint swelling.  Skin:  Negative for pallor and rash.  Neurological:  Negative for syncope and  headaches.  Psychiatric/Behavioral:  Negative for agitation and confusion.    Physical Exam Updated Vital Signs BP (!) 150/93   Pulse (!) 59   Temp 98.1 F (36.7 C)   Resp 16   SpO2 98%   Physical Exam Vitals and nursing note reviewed.  Constitutional:      General: He is not in acute distress.    Appearance: He is well-developed.  HENT:     Head: Normocephalic and atraumatic.     Right Ear: External ear normal.     Left Ear: External ear normal.     Mouth/Throat:     Mouth: Mucous membranes are moist.  Eyes:     General: No scleral icterus. Cardiovascular:     Rate and Rhythm: Normal rate and regular rhythm.     Pulses: Normal pulses.          Radial pulses are 2+ on the right side and 2+ on the left side.       Dorsalis pedis pulses are 2+ on the right side and 2+ on the left side.     Heart sounds: Normal heart sounds.  Pulmonary:     Effort: Pulmonary effort is normal. No respiratory distress.     Breath sounds: Normal breath sounds.  Abdominal:     General: Abdomen is flat.     Palpations: Abdomen is soft.     Tenderness: There is no abdominal tenderness.  Musculoskeletal:        General: Normal range of motion.     Cervical back: Normal range of motion.     Right lower leg: No edema.     Left lower leg: No edema.       Legs:  Skin:    General: Skin is warm and dry.     Capillary Refill: Capillary refill takes less than 2 seconds.  Neurological:     Mental Status: He is alert and oriented to person, place, and time.     GCS: GCS eye subscore is 4. GCS verbal subscore is 5. GCS motor subscore is 6.  Psychiatric:        Mood and Affect: Mood normal.        Behavior: Behavior normal.    ED Results / Procedures / Treatments   Labs (all labs ordered are listed, but only abnormal results are displayed) Labs Reviewed  COMPREHENSIVE METABOLIC PANEL - Abnormal; Notable for the following components:      Result Value   CO2 20 (*)    Glucose, Bld 118 (*)     Creatinine, Ser 1.36 (*)    GFR, Estimated 53 (*)    All other components within normal limits  CBC WITH DIFFERENTIAL/PLATELET - Abnormal; Notable for the following components:   Monocytes Absolute 1.1 (*)    All other components within normal limits  BRAIN NATRIURETIC PEPTIDE - Abnormal; Notable for the following components:   B Natriuretic Peptide 178.9 (*)    All other components within normal limits  SARS CORONAVIRUS 2 (TAT 6-24 HRS)  D-DIMER, QUANTITATIVE  TROPONIN I (HIGH SENSITIVITY)  TROPONIN I (HIGH SENSITIVITY)    EKG EKG Interpretation  Date/Time:  Sunday December 20 2020 11:26:00 EDT Ventricular Rate:  72 PR Interval:  176 QRS Duration: 90 QT Interval:  384 QTC Calculation: 420 R Axis:   44 Text Interpretation: Normal sinus rhythm Normal ECG Similar to prior tracing Confirmed by Wynona Dove ([id number]) on 12/20/2020 3:17:59 PM  Radiology DG Chest 2 View  Result Date: 12/20/2020 CLINICAL DATA:  Chest pain. EXAM: CHEST - 2 VIEW COMPARISON:  None. FINDINGS: The heart size and mediastinal contours are within normal limits. Both lungs are clear. The visualized skeletal structures are unremarkable. IMPRESSION: No active cardiopulmonary disease. Electronically Signed   By: Dorise Bullion III M.D.   On: 12/20/2020 12:47    Procedures Procedures   Medications Ordered in ED Medications  aspirin tablet 325  mg (325 mg Oral Given 12/20/20 1524)  nitroGLYCERIN (NITROSTAT) SL tablet 0.4 mg (0.4 mg Sublingual Given 12/20/20 1525)  sodium chloride 0.9 % bolus 500 mL (500 mLs Intravenous New Bag/Given 12/20/20 1551)    ED Course  I have reviewed the triage vital signs and the nursing notes.  Pertinent labs & imaging results that were available during my care of the patient were reviewed by me and considered in my medical decision making (see chart for details).    MDM Rules/Calculators/A&P                            80 year old male with history as above presented to ED secondary  to chest pain, diaphoresis, nausea, lightheadedness.  Dyspnea.  Patient is currently symptomatic.  Given aspirin, nitroglycerin after reviewing EKG.  EKG does not demonstrate dense of acute ischemia, no STEMI.  Similar morphology to prior EKG. vital signs reviewed and are stable.  He is in no acute distress currently.  Serious etiology considered.  Chest x-ray reviewed and stable.  Low risk Wells score, obtain D-dimer, this is negative. PE unlikely.  Nitro and ASA given, pain has improved. Troponin is not acutely elevated, Cr mildly increased from baseline. He is tolerating PO.  CXR reviewed, stable.  Patient with elevated heart score, anginal chest pain, chest pain with concerning features; recommend admission for cardiology evaluation, he is agreeable. D/w [facility] hospitalist group who accepts patient for admission  Continue cardiac monitoring.   Final Clinical Impression(s) / ED Diagnoses Final diagnoses:  Anginal chest pain at rest [facility])  Moderate risk chest pain    Rx / DC Orders ED Discharge Orders     None        Jeanell Sparrow, DO 12/20/20 1656

## 2020-12-20 NOTE — Consult Note (Signed)
Cardiology Consult    Patient ID: Jake Dunn MRN: [id number], DOB/AGE: 07/18/1940   Admit date: 12/20/2020 Date of Consult: 12/20/2020 Requesting Provider: Wynetta Fines, MD  PCP:  Harlan Stains, MD Primary Cardiologist:  Peter Martinique, MD   {  Patient Profile    Jake Dunn is a 80 y.o. male with a history of CAD s/p CABG (2020 for UA; LIMA-LAD, SVG-rPLA, SVG-RI/D2), post-op AF (off OAC/AAD), CVA without residual deficit, HFpEF, HTN, spinal stenosis s/p multiple surgeries. He is being seen today (12/20/2020) for the evaluation of chest pain.  History of Present Illness    Reports worsening SOB over the last few months, with more significant dyspnea and fatigue for the last 3-4 weeks. He began having minor chest pain last week for a few minutes with each episode, but on Thursday (8/11) after gardening had a more severe episode associated with dyspnea and nausea, lasting a few minutes. Called triage line but did not come in for evaluation as recommended. Tried nitroglycerin once, but it was expired and only had minimal effect. Had 2-3 more episodes Friday and yesterday as well, primarily with exertion but has occurred with rest on occasion over the weekend, one associated with numbness in his jaw and left arm, so he eventually decided to come to the ED. Pain is not pleuritic or positional. He has noted his BP at home has been more labile despite good adherence to his medications. Denies any orthopnea, leg swelling, or PND.  Of note, he underwent Lexiscan nuclear MPI 05/29/20 for brief atypical chest pains that showed normal perfusion. He was also seen by pulmonology this May around the time his dyspnea returned following the CABG. He was scheduled for PFTs and echo but were postponed due to positive COVID test (minimal symptoms); he was also started on Trelegy but he doesn't think it has really helped.   ED work-up notable for flat troponin at 7, no significant ECG changes, D-dimer normal,  mildly elevated Cr (1.36 from 1.15), BNP 179 (50 in 2020). CXR without acute process.   Past Medical History   Past Medical History:  Diagnosis Date   Adenomatous polyp    Anxiety    Arthritis    "knees, ankles, shoulders" (08/15/2016)   CAD (coronary artery disease)    s/p cath in 2014 showing significant stenosis in the diagonal side branches and in the RV marginal branch. These vessels were small and diffusely diseased. He is managed medically. 08/16/16 Cath, no disease progression   Chronic lower back pain    Disc disease, degenerative, cervical    GERD (gastroesophageal reflux disease)    Gout    Hyperlipidemia    Hypertension    Osteoarthritis    Renal cell carcinoma    left kidney removal   Stroke [facility])    MINI STROKE 15+ YRS AGO, no residual   Stroke ([place])    "I've had 2 or 3"; denies residual on 08/15/2016   Syncope and collapse     Past Surgical History:  Procedure Laterality Date   ANTERIOR CERVICAL DECOMP/DISCECTOMY FUSION  02/2004; 04/2005   [name] 09/20/2010; [name] [id number]   APPLICATION OF ROBOTIC ASSISTANCE FOR SPINAL PROCEDURE N/A 01/02/2018   Procedure: APPLICATION OF ROBOTIC ASSISTANCE FOR SPINAL PROCEDURE;  Surgeon: Kristeen Miss, MD;  Location: [place];  Service: Neurosurgery;  Laterality: N/A;   BACK SURGERY     CARDIAC CATHETERIZATION  12/14/2008   ef 50-55%. SHOWED SIGNIFICANT STENOSIS AND TO DIAGONAL SIDE BRANCHES AND IN THE  RIGHT VENTRICULAR MARGINAL BRANCH. THESE VESSELS WERE SMALL AND DIFFUSELY DISEASED   CARDIOVASCULAR STRESS TEST  12/10/2009   EF 61%. EKG negative. Mild peri ischemia. Managed medically.    CARPAL TUNNEL RELEASE Right 11/2005   [name] 09/20/2010   CARPAL TUNNEL RELEASE Left 06/2007   [name] [id number]/132012   COLONOSCOPY  11/2004   [name] 09/20/2010   COLONOSCOPY W/ BIOPSIES AND POLYPECTOMY  09/2002   [name] 09/20/2010   CORONARY ARTERY BYPASS GRAFT N/A 04/02/2019   Procedure: CORONARY ARTERY BYPASS GRAFTING (CABG) using LIMA to LAD;  Endoscopically harvested right greater saphenous vein to the PLB, Ramus, and Diag 2.;  Surgeon: Lajuana Matte, MD;  Location: [place];  Service: Open Heart Surgery;  Laterality: N/A;   INCISION AND DRAINAGE ABSCESS Right 10/18/2016   Procedure: INCISION AND DRAINAGE ABSCESS RIGHT SHOULDER;  Surgeon: [name], MD;  Location: [place];  Service: Orthopedics;  Laterality: Right;   INCISION AND DRAINAGE OF WOUND Right 08/2005   shoulder/notes 09/20/2010   IR FLUORO GUIDE CV LINE LEFT  10/19/2016   IR US GUIDE VASC ACCESS LEFT  10/19/2016   IRRIGATION AND DEBRIDEMENT SHOULDER Right 12/22/2016   Procedure: IRRIGATION AND DEBRIDEMENT RIGHT SHOULDER;  Surgeon: [name], MD;  Location: [place];  Service: Orthopedics;  Laterality: Right;   JOINT REPLACEMENT     LEFT HEART CATH AND CORONARY ANGIOGRAPHY N/A 08/16/2016   Procedure: Left Heart Cath and Coronary Angiography;  Surgeon: [name], MD;  Location: [place] CV LAB;  Service: Cardiovascular;  Laterality: N/A;   LEFT HEART CATH AND CORONARY ANGIOGRAPHY N/A 03/26/2019   Procedure: LEFT HEART CATH AND CORONARY ANGIOGRAPHY;  Surgeon: Martinique, Peter M, MD;  Location: [place] CV LAB;  Service: Cardiovascular;  Laterality: N/A;   LEFT HEART CATHETERIZATION WITH CORONARY ANGIOGRAM N/A 08/09/2012   Procedure: LEFT HEART CATHETERIZATION WITH CORONARY ANGIOGRAM;  Surgeon: Peter M Martinique, MD;  Location: [facility] CATH LAB;  Service: Cardiovascular;  Laterality: N/A;   LUMBAR FUSION     NEPHRECTOMY Left early 2000s   REPLACEMENT TOTAL KNEE Right 08/2008   [name] 09/07/2010   SHOULDER ARTHROSCOPY WITH ROTATOR CUFF REPAIR Right 06/2005   [name] 09/20/2010   TOTAL KNEE ARTHROPLASTY  06/08/2011   Procedure: TOTAL KNEE ARTHROPLASTY;  Surgeon: [name], MD;  Location: [place];  Service: Orthopedics;  Laterality: Left;   TOTAL SHOULDER ARTHROPLASTY Right 04/06/2016   Procedure: RIGHT REVERSE TOTAL SHOULDER ARTHROPLASTY;  Surgeon:  [name], MD;  Location: [place];  Service: Orthopedics;  Laterality: Right;   TOTAL SHOULDER REVISION Right 12/22/2016   Procedure: RIGHT SHOULDER GLENOID AND HUMERAL COMPONENT REVISION;  Surgeon: [name], MD;  Location: [place];  Service: Orthopedics;  Laterality: Right;   US ECHOCARDIOGRAPHY  12/15/2008   EF 60-65%   US ECHOCARDIOGRAPHY  09/28/2004   EF 55-60%     Allergies  Allergen Reactions   Adhesive [Tape] Rash    Paper tape okay   Latex Rash   Inpatient Medications     [START ON 12/21/2020] aspirin EC  81 mg Oral Daily   aspirin  325 mg Oral Daily   atorvastatin  80 mg Oral QPM   enoxaparin (LOVENOX) injection  40 mg Subcutaneous Q24H   fluticasone furoate-vilanterol  1 puff Inhalation Daily   furosemide  40 mg Oral Daily   metoprolol tartrate  25 mg Oral BID   pantoprazole  40 mg Oral QPM   sodium chloride flush  3  mL Intravenous Q12H   umeclidinium bromide  1 puff Inhalation Daily    Family History    Family History  Problem Relation Age of Onset   Heart attack Father    Heart disease Brother    Allergic rhinitis Neg Hx    Angioedema Neg Hx    Asthma Neg Hx    Atopy Neg Hx    Eczema Neg Hx    Immunodeficiency Neg Hx    Urticaria Neg Hx    He indicated that his father is deceased. He indicated that the status of his brother is unknown. He indicated that his maternal grandmother is deceased. He indicated that his maternal grandfather is deceased. He indicated that his paternal grandmother is deceased. He indicated that his paternal grandfather is deceased. He indicated that the status of his neg hx is unknown.   Social History    Social History   Socioeconomic History   Marital status: Married    Spouse name: Not on file   Number of children: Not on file   Years of education: Not on file   Highest education level: Not on file  Occupational History   Not on file  Tobacco Use   Smoking status: Former    Types: Cigarettes    Quit date:  05/31/1989    Years since quitting: 31.5   Smokeless tobacco: Never   Tobacco comments:    08/15/2016 "hadn't smoked a carton of cigarettes all my life"  Vaping Use   Vaping Use: Never used  Substance and Sexual Activity   Alcohol use: Yes    Alcohol/week: 0.0 standard drinks    Comment: "sometimes daily, sometimes weekly" beer   Drug use: No   Sexual activity: Not Currently  Other Topics Concern   Not on file  Social History Narrative   Not on file   Social Determinants of Health   Financial Resource Strain: Not on file  Food Insecurity: Not on file  Transportation Needs: Not on file  Physical Activity: Not on file  Stress: Not on file  Social Connections: Not on file  Intimate Partner Violence: Not on file     Review of Systems    A comprehensive ROS was performed with pertinent positives and negatives noted in the HPI  Physical Exam    Blood pressure (!) 161/92, pulse (!) 58, temperature 98.1 F (36.7 C), resp. rate 17, SpO2 95 %.     Intake/Output Summary (Last 24 hours) at 12/20/2020 2031 Last data filed at 12/20/2020 1656 Gross per 24 hour  Intake 500 ml  Output --  Net 500 ml   Wt Readings from Last 3 Encounters:  09/14/20 107 kg  05/29/20 103.9 kg  05/13/20 103.9 kg    CONSTITUTIONAL: alert and conversant, well-appearing, nourished, no distress HEENT: normal NECK: no JVD, no masses CARDIAC: Regular rhythm. Normal S1/S2, no S3/S4. No murmur. No friction rub.  VASCULAR: Radial pulses intact bilaterally. No carotid bruits. PULMONARY/CHEST WALL: no deformities, normal breath sounds bilaterally, normal work of breathing ABDOMINAL: soft, non-tender, non-distended EXTREMITIES: no edema, no muscle atrophy, warm and well-perfused SKIN: Dry and intact without apparent rashes or wounds. No peripheral cyanosis. NEUROLOGIC: alert, no abnormal movements, cranial nerves grossly intact. PSYCH: normal affect, normal speech and language   Labs    Recent Labs     12/20/20 1136 12/20/20 1336  TROPONINIHS 7 7   Lab Results  Component Value Date   WBC 7.9 12/20/2020   HGB 14.9 12/20/2020   HCT  44.6 12/20/2020   MCV 93.3 12/20/2020   PLT 273 12/20/2020    Recent Labs  Lab 12/20/20 1136  NA 135  K 4.2  CL 105  CO2 20*  BUN 20  CREATININE 1.36*  CALCIUM 9.2  PROT 6.9  BILITOT 1.0  ALKPHOS 69  ALT 14  AST 15  GLUCOSE 118*   Lab Results  Component Value Date   CHOL 120 11/21/2019   HDL 44 11/21/2019   LDLCALC 56 11/21/2019   TRIG 112 11/21/2019   Lab Results  Component Value Date   DDIMER 0.41 12/20/2020   Recent Labs    12/20/20 1136  BNP 178.9*       Radiology Studies    DG Chest 2 View  Result Date: 12/20/2020 CLINICAL DATA:  Chest pain. EXAM: CHEST - 2 VIEW COMPARISON:  None. FINDINGS: The heart size and mediastinal contours are within normal limits. Both lungs are clear. The visualized skeletal structures are unremarkable. IMPRESSION: No active cardiopulmonary disease. Electronically Signed   By: Dorise Bullion III M.D.   On: 12/20/2020 12:47    ECG & Cardiac Imaging    ECG: NSR, borderline IVCD, otherwise normal - personally reviewed.  LEFT HEART CATH AND CORONARY ANGIOGRAPHY 03/26/19  Prox RCA lesion is 45% stenosed. 2nd RPL lesion is 80% stenosed. Ost LM to Mid LM lesion is 50% stenosed. Ost Cx to Mid Cx lesion is 80% stenosed. Ramus lesion is 90% stenosed. Mid LAD lesion is 70% stenosed. 2nd Diag lesion is 60% stenosed. The left ventricular systolic function is normal. LV end diastolic pressure is normal. The left ventricular ejection fraction is 55-65% by visual estimate.   Echo 03/30/19: Moderate LVH. LVEF 60%, normal wall motion, avg GLS -20%, normal diastology.  RV normal size, mildly reduced systolic function, normal RVSP/RAP. Severe left atrial dilation, mild right atrial dilation.  No significant valvular stneosis or regurgitation Mildly dilated ascending aorta (83m).  Assessment & Plan     Unstable angina with history of CAD s/p CABG in 2020: Progressive exertional intolerance and more acute chest pain episodes that seem potentially anginal, additionally his history is a little difficult to really clarify well enough to exclude unstable angina. His BP may be contributing, but is labile and the association does not seem consistent. He is very distressed about these symptoms, so pursuing further coronary evaluation at this point seems reasonable. He says he would not be reassured with stress testing as all of his prior perfusion imaging has been negative despite his known disease. Given the progression and instability of his symptoms, proceeding with angiography is reasonable.   - Continue aspirin, metoprolol, and atorvastatin at current doses - No indication for therapeutic heparin at this time. - TTE in the morning. Keep NPO for likely cath.  - Keep on telemetry - Due to labile BP with multiple readings in normal range, would hold off on further antianginal titration currently, though I would favor adding low-dose Imdur at discharge if no clear revascularizable culprit lesion is identified  Signed, SMarykay Lex MD 12/20/2020, 8:31 PM  For questions or updates, please contact   Please consult [email or web address] for contact info under Cardiology/STEMI.

## 2020-12-21 ENCOUNTER — Encounter (HOSPITAL_COMMUNITY): Payer: Self-pay | Admitting: Cardiology

## 2020-12-21 ENCOUNTER — Other Ambulatory Visit: Payer: Self-pay

## 2020-12-21 ENCOUNTER — Telehealth: Payer: Self-pay | Admitting: Nurse Practitioner

## 2020-12-21 ENCOUNTER — Emergency Department (HOSPITAL_COMMUNITY)
Admission: EM | Admit: 2020-12-21 | Discharge: 2020-12-22 | Disposition: A | Discharge status: Home / Self Care | Payer: Medicare Other | Source: Home / Self Care

## 2020-12-21 ENCOUNTER — Other Ambulatory Visit: Payer: Self-pay | Admitting: Physician Assistant

## 2020-12-21 ENCOUNTER — Observation Stay (HOSPITAL_COMMUNITY): Payer: Medicare Other

## 2020-12-21 ENCOUNTER — Encounter (HOSPITAL_COMMUNITY)
Admission: EM | Disposition: A | Discharge status: Home / Self Care | Payer: Self-pay | Source: Home / Self Care | Attending: Emergency Medicine

## 2020-12-21 DIAGNOSIS — Z7982 Long term (current) use of aspirin: Secondary | ICD-10-CM | POA: Insufficient documentation

## 2020-12-21 DIAGNOSIS — Z79899 Other long term (current) drug therapy: Secondary | ICD-10-CM | POA: Insufficient documentation

## 2020-12-21 DIAGNOSIS — I97630 Postprocedural hematoma of a circulatory system organ or structure following a cardiac catheterization: Secondary | ICD-10-CM | POA: Insufficient documentation

## 2020-12-21 DIAGNOSIS — I208 Other forms of angina pectoris: Secondary | ICD-10-CM

## 2020-12-21 DIAGNOSIS — Z951 Presence of aortocoronary bypass graft: Secondary | ICD-10-CM | POA: Insufficient documentation

## 2020-12-21 DIAGNOSIS — I10 Essential (primary) hypertension: Secondary | ICD-10-CM | POA: Diagnosis not present

## 2020-12-21 DIAGNOSIS — Z91040 Latex allergy status: Secondary | ICD-10-CM | POA: Insufficient documentation

## 2020-12-21 DIAGNOSIS — Z85528 Personal history of other malignant neoplasm of kidney: Secondary | ICD-10-CM | POA: Insufficient documentation

## 2020-12-21 DIAGNOSIS — Z87891 Personal history of nicotine dependence: Secondary | ICD-10-CM | POA: Insufficient documentation

## 2020-12-21 DIAGNOSIS — I2510 Atherosclerotic heart disease of native coronary artery without angina pectoris: Secondary | ICD-10-CM | POA: Insufficient documentation

## 2020-12-21 DIAGNOSIS — R2232 Localized swelling, mass and lump, left upper limb: Secondary | ICD-10-CM

## 2020-12-21 DIAGNOSIS — Z96652 Presence of left artificial knee joint: Secondary | ICD-10-CM | POA: Insufficient documentation

## 2020-12-21 DIAGNOSIS — I25118 Atherosclerotic heart disease of native coronary artery with other forms of angina pectoris: Secondary | ICD-10-CM

## 2020-12-21 DIAGNOSIS — E785 Hyperlipidemia, unspecified: Secondary | ICD-10-CM

## 2020-12-21 DIAGNOSIS — Z96611 Presence of right artificial shoulder joint: Secondary | ICD-10-CM | POA: Insufficient documentation

## 2020-12-21 DIAGNOSIS — R609 Edema, unspecified: Secondary | ICD-10-CM | POA: Diagnosis not present

## 2020-12-21 HISTORY — PX: LEFT HEART CATH AND CORS/GRAFTS ANGIOGRAPHY: CATH118250

## 2020-12-21 LAB — BASIC METABOLIC PANEL
Anion gap: 8 (ref 5–15)
BUN: 14 mg/dL (ref 8–23)
CO2: 22 mmol/L (ref 22–32)
Calcium: 8.6 mg/dL — ABNORMAL LOW (ref 8.9–10.3)
Chloride: 107 mmol/L (ref 98–111)
Creatinine, Ser: 1.04 mg/dL (ref 0.61–1.24)
GFR, Estimated: >60 mL/min (ref >60)
Glucose, Bld: 107 mg/dL — ABNORMAL HIGH (ref 70–99)
Potassium: 3.8 mmol/L (ref 3.5–5.1)
Sodium: 137 mmol/L (ref 135–145)

## 2020-12-21 LAB — SARS CORONAVIRUS 2 (TAT 6-24 HRS): SARS Coronavirus 2: NEGATIVE

## 2020-12-21 SURGERY — LEFT HEART CATH AND CORS/GRAFTS ANGIOGRAPHY
Anesthesia: LOCAL

## 2020-12-21 MED ORDER — ASPIRIN 81 MG PO CHEW: 81.0000 mg | CHEWABLE_TABLET | ORAL | Status: DC

## 2020-12-21 MED ORDER — IOHEXOL 350 MG/ML SOLN
INTRAVENOUS | Status: DC | PRN
  Administered 2020-12-21: 160 mL

## 2020-12-21 MED ORDER — HEPARIN (PORCINE) IN NACL 1000-0.9 UT/500ML-% IV SOLN
INTRAVENOUS | Status: DC | PRN
  Administered 2020-12-21: 500 mL

## 2020-12-21 MED ORDER — SODIUM CHLORIDE 0.9 % WEIGHT BASED INFUSION
1.0000 mL/kg/h | INTRAVENOUS | Status: AC
  Administered 2020-12-21: 1 mL/kg/h via INTRAVENOUS

## 2020-12-21 MED ORDER — SODIUM CHLORIDE 0.9 % WEIGHT BASED INFUSION
1.0000 mL/kg/h | INTRAVENOUS | Status: DC
Start: 2020-12-22 — End: 2020-12-21

## 2020-12-21 MED ORDER — MORPHINE SULFATE (PF) 2 MG/ML IV SOLN
2.0000 mg | INTRAVENOUS | Status: DC | PRN
  Administered 2020-12-21: 2 mg via INTRAVENOUS
  Filled 2020-12-21: qty 1

## 2020-12-21 MED ORDER — HYDRALAZINE HCL 20 MG/ML IJ SOLN: 10.0000 mg | INTRAMUSCULAR | Status: DC | PRN

## 2020-12-21 MED ORDER — MIDAZOLAM HCL 2 MG/2ML IJ SOLN
INTRAMUSCULAR | Status: AC
  Filled 2020-12-21: qty 2

## 2020-12-21 MED ORDER — HEPARIN (PORCINE) IN NACL 1000-0.9 UT/500ML-% IV SOLN
INTRAVENOUS | Status: DC | PRN
Start: 2020-12-21 — End: 2020-12-21
  Administered 2020-12-21: 500 mL

## 2020-12-21 MED ORDER — SODIUM CHLORIDE 0.9% FLUSH: 3.0000 mL | Freq: Two times a day (BID) | INTRAVENOUS | Status: DC

## 2020-12-21 MED ORDER — SODIUM CHLORIDE 0.9 % IV SOLN: 250.0000 mL | INTRAVENOUS | Status: DC | PRN

## 2020-12-21 MED ORDER — HEPARIN (PORCINE) IN NACL 1000-0.9 UT/500ML-% IV SOLN
INTRAVENOUS | Status: AC
  Filled 2020-12-21: qty 500

## 2020-12-21 MED ORDER — MIDAZOLAM HCL 2 MG/2ML IJ SOLN
INTRAMUSCULAR | Status: DC | PRN
  Administered 2020-12-21: 1 mg via INTRAVENOUS
  Administered 2020-12-21: 2 mg via INTRAVENOUS

## 2020-12-21 MED ORDER — FENTANYL CITRATE (PF) 100 MCG/2ML IJ SOLN
INTRAMUSCULAR | Status: DC | PRN
  Administered 2020-12-21 (×2): 25 ug via INTRAVENOUS

## 2020-12-21 MED ORDER — SODIUM CHLORIDE 0.9 % WEIGHT BASED INFUSION: 3.0000 mL/kg/h | INTRAVENOUS | Status: DC

## 2020-12-21 MED ORDER — FENTANYL CITRATE (PF) 100 MCG/2ML IJ SOLN
INTRAMUSCULAR | Status: AC
  Filled 2020-12-21: qty 2

## 2020-12-21 MED ORDER — LIDOCAINE HCL (PF) 1 % IJ SOLN
INTRAMUSCULAR | Status: AC
  Filled 2020-12-21: qty 30

## 2020-12-21 MED ORDER — HEPARIN SODIUM (PORCINE) 1000 UNIT/ML IJ SOLN
INTRAMUSCULAR | Status: AC
  Filled 2020-12-21: qty 1

## 2020-12-21 MED ORDER — SODIUM CHLORIDE 0.9% FLUSH: 3.0000 mL | INTRAVENOUS | Status: DC | PRN

## 2020-12-21 MED ORDER — LIDOCAINE HCL (PF) 1 % IJ SOLN
INTRAMUSCULAR | Status: DC | PRN
  Administered 2020-12-21: 2 mL

## 2020-12-21 MED ORDER — LABETALOL HCL 5 MG/ML IV SOLN: 10.0000 mg | INTRAVENOUS | Status: DC | PRN

## 2020-12-21 MED ORDER — VERAPAMIL HCL 2.5 MG/ML IV SOLN
INTRAVENOUS | Status: AC
  Filled 2020-12-21: qty 2

## 2020-12-21 MED ORDER — HYDROCODONE-ACETAMINOPHEN 5-325 MG PO TABS
ORAL_TABLET | ORAL | Status: AC
  Filled 2020-12-21: qty 1

## 2020-12-21 MED ORDER — SODIUM CHLORIDE 0.9 % WEIGHT BASED INFUSION
3.0000 mL/kg/h | INTRAVENOUS | Status: DC
  Administered 2020-12-21: 3 mL/kg/h via INTRAVENOUS

## 2020-12-21 MED ORDER — SODIUM CHLORIDE 0.9 % WEIGHT BASED INFUSION: 1.0000 mL/kg/h | INTRAVENOUS | Status: DC

## 2020-12-21 MED ORDER — ASPIRIN 81 MG PO CHEW
81.0000 mg | CHEWABLE_TABLET | ORAL | Status: AC
  Administered 2020-12-21: 81 mg via ORAL
  Filled 2020-12-21: qty 1

## 2020-12-21 MED ORDER — HEPARIN SODIUM (PORCINE) 1000 UNIT/ML IJ SOLN
INTRAMUSCULAR | Status: DC | PRN
  Administered 2020-12-21: 5000 [IU] via INTRAVENOUS

## 2020-12-21 MED ORDER — VERAPAMIL HCL 2.5 MG/ML IV SOLN
INTRAVENOUS | Status: DC | PRN
  Administered 2020-12-21 (×2): 10 mL via INTRA_ARTERIAL

## 2020-12-21 SURGICAL SUPPLY — 14 items
CATH EXPO 5F MPA-1 (CATHETERS) ×1 IMPLANT
CATH INFINITI 5 FR IM (CATHETERS) ×1 IMPLANT
CATH INFINITI 5 FR RCB (CATHETERS) ×1 IMPLANT
CATH INFINITI 5FR AL1 (CATHETERS) ×1 IMPLANT
CATH INFINITI 5FR MULTPACK ANG (CATHETERS) ×1 IMPLANT
DEVICE RAD COMP TR BAND LRG (VASCULAR PRODUCTS) ×1 IMPLANT
GLIDESHEATH SLEND SS 6F .021 (SHEATH) ×1 IMPLANT
GUIDEWIRE INQWIRE 1.5J.035X260 (WIRE) IMPLANT
INQWIRE 1.5J .035X260CM (WIRE) ×2
KIT HEART LEFT (KITS) ×2 IMPLANT
PACK CARDIAC CATHETERIZATION (CUSTOM PROCEDURE TRAY) ×2 IMPLANT
TRANSDUCER W/STOPCOCK (MISCELLANEOUS) ×2 IMPLANT
TUBING CIL FLEX 10 FLL-RA (TUBING) ×2 IMPLANT
WIRE HI TORQ VERSACORE-J 145CM (WIRE) ×1 IMPLANT

## 2020-12-21 NOTE — Telephone Encounter (Signed)
   Pt is s/p cath via L radial artery earlier today.  Pt just awoke from a nap and noted swelling over the left radial insertion site.  His wife called to report this and notes that it looks like an egg is overlying the wrist.  Pt has pain @ the site but is otw asymptotic.  I advised that they immediately apply pressure over the L radial site and call EMS for further eval and probable transport to the ED.  Caller verbalized understanding and was grateful for the call back.  Murray Hodgkins, NP 12/21/2020, 7:57 PM

## 2020-12-21 NOTE — ED Triage Notes (Signed)
Pt arrive by EMS from home for a heart cath side to be check. Pt was seen today and had a heart catheterization, now he has significant swollen on the side of the catheterization encouraged by provide to come and be check. BP 190/100, HR 72, 100 % RA.

## 2020-12-21 NOTE — Progress Notes (Signed)
Progress Note  Patient Name: Jake Dunn Date of Encounter: 12/21/2020  [facility] Cardiologist: Peter Martinique, MD   Subjective   Complains of mild CP; no dyspnea  Inpatient Medications    Scheduled Meds:  aspirin EC  81 mg Oral Daily   aspirin  325 mg Oral Daily   atorvastatin  80 mg Oral QPM   enoxaparin (LOVENOX) injection  40 mg Subcutaneous Q24H   fluticasone furoate-vilanterol  1 puff Inhalation Daily   furosemide  40 mg Oral Daily   metoprolol tartrate  25 mg Oral BID   pantoprazole  40 mg Oral QPM   sodium chloride flush  3 mL Intravenous Q12H   umeclidinium bromide  1 puff Inhalation Daily   Continuous Infusions:  PRN Meds: acetaminophen, albuterol, allopurinol, HYDROcodone-acetaminophen, LORazepam, nitroGLYCERIN, ondansetron (ZOFRAN) IV   Vital Signs    Vitals:   12/21/20 0345 12/21/20 0400 12/21/20 0600 12/21/20 0700  BP:  134/81 (!) 136/92 (!) 145/88  Pulse: (!) 58 (!) 59 61 (!) 59  Resp:   17   Temp:      TempSrc:      SpO2: 95% 94% 97% 97%  Weight:      Height:        Intake/Output Summary (Last 24 hours) at 12/21/2020 0722 Last data filed at 12/21/2020 0700 Gross per 24 hour  Intake 500 ml  Output 1000 ml  Net -500 ml   Last 3 Weights 12/20/2020 09/14/2020 05/29/2020  Weight (lbs) 223 lb 236 lb 229 lb  Weight (kg) 101.152 kg 107.049 kg 103.874 kg      Telemetry    Sinus - Personally Reviewed  Physical Exam   GEN: No acute distress.   Neck: No JVD Cardiac: RRR, no murmurs, rubs, or gallops.  Respiratory: Clear to auscultation bilaterally. GI: Soft, nontender, non-distended  MS: No edema Neuro:  Nonfocal  Psych: Normal affect   Labs    High Sensitivity Troponin:   Recent Labs  Lab 12/20/20 1136 12/20/20 1336  TROPONINIHS 7 7      Chemistry Recent Labs  Lab 12/20/20 1136  NA 135  K 4.2  CL 105  CO2 20*  GLUCOSE 118*  BUN 20  CREATININE 1.36*  CALCIUM 9.2  PROT 6.9  ALBUMIN 3.9  AST 15  ALT 14  ALKPHOS 69   BILITOT 1.0  GFRNONAA 53*  ANIONGAP 10     Hematology Recent Labs  Lab 12/20/20 1136  WBC 7.9  RBC 4.78  HGB 14.9  HCT 44.6  MCV 93.3  MCH 31.2  MCHC 33.4  RDW 14.6  PLT 273    BNP Recent Labs  Lab 12/20/20 1136  BNP 178.9*     DDimer  Recent Labs  Lab 12/20/20 1517  DDIMER 0.41     Radiology    DG Chest 2 View  Result Date: 12/20/2020 CLINICAL DATA:  Chest pain. EXAM: CHEST - 2 VIEW COMPARISON:  None. FINDINGS: The heart size and mediastinal contours are within normal limits. Both lungs are clear. The visualized skeletal structures are unremarkable. IMPRESSION: No active cardiopulmonary disease. Electronically Signed   By: Dorise Bullion III M.D.   On: 12/20/2020 12:47    Patient Profile     80 y.o. male with past medical history of coronary artery disease status post coronary artery bypass and graft in 2020 (LIMA to the LAD, saphenous vein graft to the posterior lateral and saphenous vein graft to the ramus intermedius/second diagonal), prior CVA, hypertension, chronic  diastolic congestive heart failure with chest pain.  Assessment & Plan    1 chest pain-symptoms somewhat atypical.  Electrocardiogram with no ST changes.  Enzymes are negative.  However he is extremely concerned about his symptoms and they have been recurrent.  He had a nuclear study in January that showed no ischemia or infarction.  Given persistent symptoms we will proceed with cardiac catheterization for definitive evaluation.  The risk and benefits including myocardial infarction, CVA and death discussed and he agrees to proceed.  Hold Lasix prior to procedure and resume after.  2 hypertension-continue present blood pressure medications.  Follow and adjust as needed.  3 hyperlipidemia-continue statin.  For questions or updates, please contact [place] Please consult [email or web address] for contact info under        Signed, Kirk Ruths, MD  12/21/2020, 7:22 AM

## 2020-12-21 NOTE — H&P (View-Only) (Signed)
Progress Note  Patient Name: Jake Dunn Date of Encounter: 12/21/2020  [facility] Cardiologist: Peter Martinique, MD   Subjective   Complains of mild CP; no dyspnea  Inpatient Medications    Scheduled Meds:  aspirin EC  81 mg Oral Daily   aspirin  325 mg Oral Daily   atorvastatin  80 mg Oral QPM   enoxaparin (LOVENOX) injection  40 mg Subcutaneous Q24H   fluticasone furoate-vilanterol  1 puff Inhalation Daily   furosemide  40 mg Oral Daily   metoprolol tartrate  25 mg Oral BID   pantoprazole  40 mg Oral QPM   sodium chloride flush  3 mL Intravenous Q12H   umeclidinium bromide  1 puff Inhalation Daily   Continuous Infusions:  PRN Meds: acetaminophen, albuterol, allopurinol, HYDROcodone-acetaminophen, LORazepam, nitroGLYCERIN, ondansetron (ZOFRAN) IV   Vital Signs    Vitals:   12/21/20 0345 12/21/20 0400 12/21/20 0600 12/21/20 0700  BP:  134/81 (!) 136/92 (!) 145/88  Pulse: (!) 58 (!) 59 61 (!) 59  Resp:   17   Temp:      TempSrc:      SpO2: 95% 94% 97% 97%  Weight:      Height:        Intake/Output Summary (Last 24 hours) at 12/21/2020 0722 Last data filed at 12/21/2020 0700 Gross per 24 hour  Intake 500 ml  Output 1000 ml  Net -500 ml   Last 3 Weights 12/20/2020 09/14/2020 05/29/2020  Weight (lbs) 223 lb 236 lb 229 lb  Weight (kg) 101.152 kg 107.049 kg 103.874 kg      Telemetry    Sinus - Personally Reviewed  Physical Exam   GEN: No acute distress.   Neck: No JVD Cardiac: RRR, no murmurs, rubs, or gallops.  Respiratory: Clear to auscultation bilaterally. GI: Soft, nontender, non-distended  MS: No edema Neuro:  Nonfocal  Psych: Normal affect   Labs    High Sensitivity Troponin:   Recent Labs  Lab 12/20/20 1136 12/20/20 1336  TROPONINIHS 7 7      Chemistry Recent Labs  Lab 12/20/20 1136  NA 135  K 4.2  CL 105  CO2 20*  GLUCOSE 118*  BUN 20  CREATININE 1.36*  CALCIUM 9.2  PROT 6.9  ALBUMIN 3.9  AST 15  ALT 14  ALKPHOS 69   BILITOT 1.0  GFRNONAA 53*  ANIONGAP 10     Hematology Recent Labs  Lab 12/20/20 1136  WBC 7.9  RBC 4.78  HGB 14.9  HCT 44.6  MCV 93.3  MCH 31.2  MCHC 33.4  RDW 14.6  PLT 273    BNP Recent Labs  Lab 12/20/20 1136  BNP 178.9*     DDimer  Recent Labs  Lab 12/20/20 1517  DDIMER 0.41     Radiology    DG Chest 2 View  Result Date: 12/20/2020 CLINICAL DATA:  Chest pain. EXAM: CHEST - 2 VIEW COMPARISON:  None. FINDINGS: The heart size and mediastinal contours are within normal limits. Both lungs are clear. The visualized skeletal structures are unremarkable. IMPRESSION: No active cardiopulmonary disease. Electronically Signed   By: Dorise Bullion III M.D.   On: 12/20/2020 12:47    Patient Profile     80 y.o. male with past medical history of coronary artery disease status post coronary artery bypass and graft in 2020 (LIMA to the LAD, saphenous vein graft to the posterior lateral and saphenous vein graft to the ramus intermedius/second diagonal), prior CVA, hypertension, chronic  diastolic congestive heart failure with chest pain.  Assessment & Plan    1 chest pain-symptoms somewhat atypical.  Electrocardiogram with no ST changes.  Enzymes are negative.  However he is extremely concerned about his symptoms and they have been recurrent.  He had a nuclear study in January that showed no ischemia or infarction.  Given persistent symptoms we will proceed with cardiac catheterization for definitive evaluation.  The risk and benefits including myocardial infarction, CVA and death discussed and he agrees to proceed.  Hold Lasix prior to procedure and resume after.  2 hypertension-continue present blood pressure medications.  Follow and adjust as needed.  3 hyperlipidemia-continue statin.  For questions or updates, please contact [place] Please consult [email or web address] for contact info under        Signed, Kirk Ruths, MD  12/21/2020, 7:22 AM

## 2020-12-21 NOTE — Discharge Instructions (Signed)
Go to [facility] on Thursday 12/24/20 between 8:00-4:00 for labs

## 2020-12-21 NOTE — ED Notes (Addendum)
Pt in room resting. NAD chest rising and falling.  

## 2020-12-21 NOTE — Progress Notes (Signed)
Pt discharged home by Rennis Harding, RN, IV dc'd by Drema Pry, RN, see flowsheet, wife at bedside, pt escorted out by tech and wheelchair, safety maintained

## 2020-12-21 NOTE — Discharge Summary (Signed)
Physician Discharge Summary  Jake Dunn [id number] DOB: [birth year] DOA: 12/20/2020  PCP: Harlan Stains, MD  Admit date: 12/20/2020 Discharge date: 12/21/2020  Admitted From: Home Disposition: Home  Recommendations for Outpatient Follow-up:  Follow up with PCP in 1-2 weeks Follow-up with cardiology for BMP in 3 days given contrast exposure        Home Health: None Equipment/Devices: None  Discharge Condition: Good CODE STATUS: Full Diet recommendation: Cardiac  Brief/Interim Summary: Jake Dunn is a 80 year old M with CAD status post CABG, as CHF EF 45%, HTN, asthma/COPD who presented with left-sided chest pain, stuttering for the last 3 days.  Patient had several episodes of chest pain over the last few days prior to admission, left-sided, dull in character, radiating into the left arm, and nonexertional, worse with sitting quietly.  He was concerned that these were related to his heart so he came to the ER.  In the ER, D-dimer normal, chest x-ray clear.  ECG unremarkable.  Troponins undetectable.  Given the accelerating character, cardiology were consulted,     Bessie DIAGNOSIS: Noncardiac chest pain    Discharge Diagnoses:  Noncardiac chest pain Patient was admitted, serial troponins were undetectable, he was taken to the Cath Lab by cardiology, who found no new stenosis.  Cardiology recommended continued medical management, no changes to his regimen.  Chronic systolic CHF Appears euvolemic  Hypertension  CKD Stage I  Asthma/COPD No evidence of flare           Discharge Instructions  Discharge Instructions     Discharge instructions   Complete by: As directed    From Dr. Loleta Books: You were evaluated for chest pain Here, we ruled out that this was from a pneumonia or blood clot.  We also did a thorough evaluation for heart attack, and this was normal and reassuring.  Go see Dr. Doug Sou office on Thursday to have blood work  and have your kidney function checked.  Call them today to get this set up.  Go see Dr. Dema Severin next week to make sure the symptoms have resolved   Increase activity slowly   Complete by: As directed       Allergies as of 12/21/2020       Reactions   Adhesive [tape] Rash   Paper tape okay   Latex Rash        Medication List     STOP taking these medications    benzonatate 100 MG capsule Commonly known as: Tessalon Perles   chlorpheniramine-HYDROcodone 10-8 MG/5ML Suer Commonly known as: Tussionex Pennkinetic ER   ondansetron 4 MG disintegrating tablet Commonly known as: Zofran ODT       TAKE these medications    acyclovir 400 MG tablet Commonly known as: ZOVIRAX Take 400 mg by mouth 3 (three) times daily as needed (For cold sores). Marland Kitchen   albuterol 108 (90 Base) MCG/ACT inhaler Commonly known as: VENTOLIN HFA Inhale 2 puffs into the lungs every 6 (six) hours as needed for wheezing or shortness of breath.   allopurinol 300 MG tablet Commonly known as: ZYLOPRIM Take 300 mg by mouth daily as needed (for gout flares).   aspirin EC 81 MG tablet Take 1 tablet (81 mg total) by mouth daily.   atorvastatin 80 MG tablet Commonly known as: LIPITOR Take 1 tablet (80 mg total) by mouth every evening.   furosemide 20 MG tablet Commonly known as: LASIX TAKE 1 TABLET(20 MG) BY MOUTH DAILY What changed:  how much to take  how to take this when to take this additional instructions   HYDROcodone-acetaminophen 5-325 MG tablet Commonly known as: NORCO/VICODIN Take 1 tablet by mouth 3 (three) times daily as needed for moderate pain.   LORazepam 0.5 MG tablet Commonly known as: ATIVAN Take 0.5 mg by mouth 2 (two) times daily as needed for anxiety.   metoprolol tartrate 25 MG tablet Commonly known as: LOPRESSOR Take 25 mg by mouth 2 (two) times daily.   pantoprazole 40 MG tablet Commonly known as: PROTONIX Take 40 mg by mouth every evening.   Trelegy Ellipta  100-62.5-25 MCG/INH Aepb Generic drug: Fluticasone-Umeclidin-Vilant Inhale 1 puff into the lungs daily. What changed: Another medication with the same name was removed. Continue taking this medication, and follow the directions you see here.        Follow-up Information     Harlan Stains, MD. Schedule an appointment as soon as possible for a visit in 1 week(s).   Specialty: Family Medicine Contact information: [address] [place] [address] [phone]         Martinique, Peter M, MD .   Specialty: Cardiology Contact information: [address] [place] [address] [phone]                Allergies  Allergen Reactions   Adhesive [Tape] Rash    Paper tape okay   Latex Rash    Consultations: Cardiology   Procedures/Studies: DG Chest 2 View  Result Date: 12/20/2020 CLINICAL DATA:  Chest pain. EXAM: CHEST - 2 VIEW COMPARISON:  None. FINDINGS: The heart size and mediastinal contours are within normal limits. Both lungs are clear. The visualized skeletal structures are unremarkable. IMPRESSION: No active cardiopulmonary disease. Electronically Signed   By: Dorise Bullion III M.D.   On: 12/20/2020 12:47   CARDIAC CATHETERIZATION  Addendum Date: 12/21/2020     Ost Cx to Mid Cx lesion is 80% stenosed.   Prox RCA lesion is 45% stenosed.   Ramus lesion is 90% stenosed.   2nd RPL lesion is 80% stenosed.   Mid LAD lesion is 65% stenosed.   Ost LM to Mid LM lesion is 40% stenosed.   2nd Diag lesion is 90% stenosed.   LIMA graft was visualized by angiography and is normal in caliber.   SVG graft was visualized by angiography and is normal in caliber.   SVG graft was visualized by angiography and is normal in caliber.   The graft exhibits no disease.   The graft exhibits no disease.   The graft exhibits no disease.   LV end diastolic pressure is normal. 3 vessel obstructive CAD Patent LIMA - this is tied to the second diagonal and not the LAD  Patent SVG to the ramus intermediate. No visualization of SVG to diagonal which per op note came off the hood of the SVG to ramus Patent SVG to the PL branch of the RCA Normal LVEDP Plan: there appears to be good blood flow to all major vessels. The LAD was not revascularized but has borderline disease and the patient had prior Myoview with no ischemia in this territory. Other grafts are patent. Recommend continued medical therapy. Does not appear to be volume overloaded. Echo pending.  Given significant difficulty from the radial approach I would recommend femoral access for any future Cardiac cath procedures.  Result Date: 12/21/2020   Colon Flattery Cx to Mid Cx lesion is 80% stenosed.   Prox RCA lesion is 45% stenosed.   Ramus  lesion is 90% stenosed.   2nd RPL lesion is 80% stenosed.   Mid LAD lesion is 65% stenosed.   Ost LM to Mid LM lesion is 40% stenosed.   2nd Diag lesion is 90% stenosed.   LIMA graft was visualized by angiography and is normal in caliber.   SVG graft was visualized by angiography and is normal in caliber.   SVG graft was visualized by angiography and is normal in caliber.   The graft exhibits no disease.   The graft exhibits no disease.   The graft exhibits no disease.   LV end diastolic pressure is normal. 3 vessel obstructive CAD Patent LIMA - this is tied to the second diagonal and not the LAD Patent SVG to the ramus intermediate. No visualization of SVG to diagonal which per op note came off the hood of the SVG to ramus Patent SVG to the PL branch of the RCA Normal LVEDP Plan: there appears to be good blood flow to all major vessels. The LAD was not revascularized but has borderline disease and the patient had prior Myoview with no ischemia in this territory. Other grafts are patent. Recommend continued medical therapy. Does not appear to be volume overloaded. Echo pending.      Subjective: Patient is feeling well.  No further chest pain.  No fever, cough, confusion.  Discharge  Exam: Vitals:   12/21/20 1340 12/21/20 1400  BP: 117/61 129/78  Pulse: 63 67  Resp: 17 (!) 21  Temp:    SpO2: 95% 96%   Vitals:   12/21/20 1300 12/21/20 1320 12/21/20 1340 12/21/20 1400  BP: 120/66 126/65 117/61 129/78  Pulse: 63 64 63 67  Resp: '15 18 17 '$ (!) 21  Temp:      TempSrc:      SpO2: 96% 95% 95% 96%  Weight:      Height:        General: Pt is alert, awake, not in acute distress Cardiovascular: RRR, nl S1-S2, no murmurs appreciated.   No LE edema.   Respiratory: Normal respiratory rate and rhythm.  CTAB without rales or wheezes. Abdominal: Abdomen soft and non-tender.  No distension or HSM.   Neuro/Psych: Strength symmetric in upper and lower extremities.  Judgment and insight appear normal.   The results of significant diagnostics from this hospitalization (including imaging, microbiology, ancillary and laboratory) are listed below for reference.     Microbiology: Recent Results (from the past 240 hour(s))  SARS CORONAVIRUS 2 (TAT 6-24 HRS) Nasopharyngeal Nasopharyngeal Swab     Status: None   Collection Time: 12/20/20  3:55 PM   Specimen: Nasopharyngeal Swab  Result Value Ref Range Status   SARS Coronavirus 2 NEGATIVE NEGATIVE Final    Comment: (NOTE) SARS-CoV-2 target nucleic acids are NOT DETECTED.  The SARS-CoV-2 RNA is generally detectable in upper and lower respiratory specimens during the acute phase of infection. Negative results do not preclude SARS-CoV-2 infection, do not rule out co-infections with other pathogens, and should not be used as the sole basis for treatment or other patient management decisions. Negative results must be combined with clinical observations, patient history, and epidemiological information. The expected result is Negative.  Fact Sheet for Patients: SugarRoll.be  Fact Sheet for Healthcare Providers: https://www.woods-mathews.com/  This test is not yet approved or cleared by  the [place] FDA and  has been authorized for detection and/or diagnosis of SARS-CoV-2 by FDA under an Emergency Use Authorization (EUA). This EUA will remain  in effect (meaning  this test can be used) for the duration of the COVID-19 declaration under Se ction 564(b)(1) of the Act, 21 U.S.C. section 360bbb-3(b)(1), unless the authorization is terminated or revoked sooner.  Performed at [facility], [place] [address] [place], [place] [address]      Labs: BNP (last 3 results) Recent Labs    12/20/20 1136  BNP [id number]   Basic Metabolic Panel: Recent Labs  Lab 12/20/20 1136 12/21/20 0835  NA 135 137  K 4.2 3.8  CL 105 107  CO2 20* 22  GLUCOSE 118* 107*  BUN 20 14  CREATININE 1.36* 1.04  CALCIUM 9.2 8.6*   Liver Function Tests: Recent Labs  Lab 12/20/20 1136  AST 15  ALT 14  ALKPHOS 69  BILITOT 1.0  PROT 6.9  ALBUMIN 3.9   No results for input(s): LIPASE, AMYLASE in the last 168 hours. No results for input(s): AMMONIA in the last 168 hours. CBC: Recent Labs  Lab 12/20/20 1136  WBC 7.9  NEUTROABS 5.1  HGB 14.9  HCT 44.6  MCV 93.3  PLT 273   Cardiac Enzymes: No results for input(s): CKTOTAL, CKMB, CKMBINDEX, TROPONINI in the last 168 hours. BNP: Invalid input(s): POCBNP CBG: No results for input(s): GLUCAP in the last 168 hours. D-Dimer Recent Labs    12/20/20 1517  DDIMER 0.41   Hgb A1c No results for input(s): HGBA1C in the last 72 hours. Lipid Profile No results for input(s): CHOL, HDL, LDLCALC, TRIG, CHOLHDL, LDLDIRECT in the last 72 hours. Thyroid function studies No results for input(s): TSH, T4TOTAL, T3FREE, THYROIDAB in the last 72 hours.  Invalid input(s): FREET3 Anemia work up No results for input(s): VITAMINB12, FOLATE, FERRITIN, TIBC, IRON, RETICCTPCT in the last 72 hours. Urinalysis    Component Value Date/Time   COLORURINE STRAW (A) 03/31/2019 1823   APPEARANCEUR CLEAR 03/31/2019 1823   LABSPEC 1.010 03/31/2019 1823    PHURINE 5.0 03/31/2019 1823   GLUCOSEU NEGATIVE 03/31/2019 1823   HGBUR NEGATIVE 03/31/2019 1823   BILIRUBINUR NEGATIVE 03/31/2019 1823   KETONESUR NEGATIVE 03/31/2019 1823   PROTEINUR NEGATIVE 03/31/2019 1823   UROBILINOGEN 0.2 06/02/2011 1327   NITRITE NEGATIVE 03/31/2019 1823   LEUKOCYTESUR NEGATIVE 03/31/2019 1823   Sepsis Labs Invalid input(s): PROCALCITONIN,  WBC,  LACTICIDVEN Microbiology Recent Results (from the past 240 hour(s))  SARS CORONAVIRUS 2 (TAT 6-24 HRS) Nasopharyngeal Nasopharyngeal Swab     Status: None   Collection Time: 12/20/20  3:55 PM   Specimen: Nasopharyngeal Swab  Result Value Ref Range Status   SARS Coronavirus 2 NEGATIVE NEGATIVE Final    Comment: (NOTE) SARS-CoV-2 target nucleic acids are NOT DETECTED.  The SARS-CoV-2 RNA is generally detectable in upper and lower respiratory specimens during the acute phase of infection. Negative results do not preclude SARS-CoV-2 infection, do not rule out co-infections with other pathogens, and should not be used as the sole basis for treatment or other patient management decisions. Negative results must be combined with clinical observations, patient history, and epidemiological information. The expected result is Negative.  Fact Sheet for Patients: SugarRoll.be  Fact Sheet for Healthcare Providers: https://www.woods-mathews.com/  This test is not yet approved or cleared by the [place] FDA and  has been authorized for detection and/or diagnosis of SARS-CoV-2 by FDA under an Emergency Use Authorization (EUA). This EUA will remain  in effect (meaning this test can be used) for the duration of the COVID-19 declaration under Se ction 564(b)(1) of the Act, 21 U.S.C. section 360bbb-3(b)(1), unless  the authorization is terminated or revoked sooner.  Performed at [facility], [place] [address], [place], [place] [address]      Time coordinating discharge:  5 minutes The [place] controlled substances registry was reviewed for this patient          SIGNED:   Edwin Dada, MD  [facility] 12/21/2020, 10:07 PM

## 2020-12-21 NOTE — Interval H&P Note (Signed)
History and Physical Interval Note:  12/21/2020 9:06 AM  Jake Dunn  has presented today for surgery, with the diagnosis of chest pain.  The various methods of treatment have been discussed with the patient and family. After consideration of risks, benefits and other options for treatment, the patient has consented to  Procedure(s): LEFT HEART CATH AND CORS/GRAFTS ANGIOGRAPHY (N/A) as a surgical intervention.  The patient's history has been reviewed, patient examined, no change in status, stable for surgery.  I have reviewed the patient's chart and labs.  Questions were answered to the patient's satisfaction.   Cath Lab Visit (complete for each Cath Lab visit)  Clinical Evaluation Leading to the Procedure:   ACS: Yes.    Non-ACS:    Anginal Classification: CCS III  Anti-ischemic medical therapy: Minimal Therapy (1 class of medications)  Non-Invasive Test Results: No non-invasive testing performed  Prior CABG: Previous CABG        Collier Salina [facility] 12/21/2020 9:07 AM

## 2020-12-21 NOTE — ED Provider Notes (Addendum)
Emergency Medicine Provider Triage Evaluation Note  Jake Dunn , a 80 y.o. male  was evaluated in triage.  Pt complains of swelling to the left arm near the left radial artery where he had a cath earlier today.  Review of Systems  Positive: Left wrist swelling Negative: numbness  Physical Exam  BP (!) 146/83   Pulse 67   Temp 98 F (36.7 C) (Oral)   Resp 16   SpO2 98%  Gen:   Awake, no distress   Resp:  Normal effort  MSK:   Moves extremities without difficulty  Other:  Hematoma to the left wrist, radial pulse is noted distally, no active bleeding, fingers are warm and well perfused  Medical Decision Making  Medically screening exam initiated at 9:37 PM.  Appropriate orders placed.  Jake Dunn was informed that the remainder of the evaluation will be completed by another provider, this initial triage assessment does not replace that evaluation, and the importance of remaining in the ED until their evaluation is complete.  9:10 PM CONSULT with Dr. Humphrey Rolls with cardiology. Recommends applying pressure. Recommends vascular US of the LUE if they are available. If unavailable then he can get this on a nonemergent basis.  Discussed with secretary who states there is no on call vascular US tech today.    Tivis Ringer, Boyd Litaker S, PA-C 12/21/20 2053    Rodney Booze, PA-C 12/21/20 2137    Deno Etienne, DO 12/21/20 2304

## 2020-12-21 NOTE — ED Provider Notes (Signed)
Patient re-evaluation: 23:56  Patient expressed concern to waiting room tachycardia that he has not been evaluated or triage since arriving in the ED earlier today.  Patient brought back to triage for reevaluation.  I have reviewed his chart and he was evaluated by PA couture earlier tonight.  PA couture spoke with on-call cardiology regarding the patient since he did have a heart catheterization this afternoon.  Was advised to have an ice pack applied.  On reevaluation, reassurance given to the patient that he was already medically screened by a provider previously.  However, he reports worsening swelling.  Arm was marked at 23:56.  At this time, I am unsure if the worsening swelling is due to having Coban compressing the hematoma.  Coban has been removed.  The patient has been given an ice pack and instructed to elevate the extremity as best as possible walking the ice pack applied to the arm.  Will recheck in 30 minutes.  If swelling continues to progress, will discuss with cardiology.  Patient is agreeable with plan at this time.  He denies any other bleeding.  He takes aspirin daily, but does not take blood thinners.        Joline Maxcy A, PA-C 12/22/20 0000    Ezequiel Essex, MD 12/22/20 [phone]

## 2020-12-22 ENCOUNTER — Ambulatory Visit (HOSPITAL_COMMUNITY)
Admission: RE | Admit: 2020-12-22 | Discharge: 2020-12-22 | Disposition: A | Discharge status: Home / Self Care | Payer: Medicare Other | Source: Ambulatory Visit | Attending: Cardiology | Admitting: Cardiology

## 2020-12-22 DIAGNOSIS — I97630 Postprocedural hematoma of a circulatory system organ or structure following a cardiac catheterization: Secondary | ICD-10-CM | POA: Insufficient documentation

## 2020-12-22 DIAGNOSIS — R2232 Localized swelling, mass and lump, left upper limb: Secondary | ICD-10-CM | POA: Insufficient documentation

## 2020-12-22 NOTE — Addendum Note (Signed)
Addended by: Fidel Levy on: 12/22/2020 02:16 PM   Modules accepted: Orders

## 2020-12-22 NOTE — ED Provider Notes (Signed)
[facility] EMERGENCY DEPARTMENT Provider Note   CSN: [id number] Arrival date & time: 12/21/20  2039     History Chief Complaint  Patient presents with   Post-op Problem    Jake Dunn is a 80 y.o. male with a history of CAD, hyperlipidemia, degenerative disc disease, and stroke who presents to the emergency department with a chief complaint of postoperative problem.  The patient had a cardiac catheterization today.  After the procedure, he went home and took a nap.  When he awoke, he noted swelling over the left wrist.  Per chart review, cardiac catheterization inserted through left radius.  He called the cardiology team after the wrist swelled up and "looked like an egg".  He was advised to immediately apply pressure to the site and call EMS to take him to the emergency department for further evaluation.  In the ED, he has swelling to the left wrist.  He reports the swelling has worsened since onset.  He denies numbness, weakness, chest pain, shortness of breath, headache, dizziness, lightheadedness, rash, or other bleeding.  He takes aspirin, but no other blood thinners.  The history is provided by medical records and the patient. No language interpreter was used.      Past Medical History:  Diagnosis Date   Adenomatous polyp    Anxiety    Arthritis    "knees, ankles, shoulders" (08/15/2016)   CAD (coronary artery disease)    s/p cath in 2014 showing significant stenosis in the diagonal side branches and in the RV marginal branch. These vessels were small and diffusely diseased. He is managed medically. 08/16/16 Cath, no disease progression   Chronic lower back pain    Disc disease, degenerative, cervical    GERD (gastroesophageal reflux disease)    Gout    Hyperlipidemia    Hypertension    Osteoarthritis    Renal cell carcinoma    left kidney removal   Stroke [facility])    MINI STROKE 15+ YRS AGO, no residual   Stroke ([place])    "I've had 2 or 3"; denies  residual on 08/15/2016   Syncope and collapse     Patient Active Problem List   Diagnosis Date Noted   Chest pain 12/20/2020   S/P CABG x 4 04/02/2019   CAD, multiple vessel 03/29/2019   Anginal chest pain at rest [facility]) 03/29/2019   Spinal stenosis of lumbar region with radiculopathy 01/02/2018   Antibiotic drug intolerance 03/01/2017   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[place]) 12/22/2016   Medication monitoring encounter 11/15/2016   Abscess of right shoulder 10/18/2016   Unstable angina ([place]) 08/15/2016   Localized primary osteoarthritis of right shoulder region 04/06/2016   Coughing/wheezing 08/17/2015   Angioedema 08/17/2015   Hypertension    CAD (coronary artery disease)    Stroke [facility])    Hyperlipidemia    Dyspnea 08/07/2012   Pre-operative clearance 06/01/2011    Past Surgical History:  Procedure Laterality Date   ANTERIOR CERVICAL DECOMP/DISCECTOMY FUSION  02/2004; 04/2005   [name] 09/20/2010; [name] [id number]   APPLICATION OF ROBOTIC ASSISTANCE FOR SPINAL PROCEDURE N/A 01/02/2018   Procedure: APPLICATION OF ROBOTIC ASSISTANCE FOR SPINAL PROCEDURE;  Surgeon: Kristeen Miss, MD;  Location: [facility];  Service: Neurosurgery;  Laterality: N/A;   BACK SURGERY     CARDIAC CATHETERIZATION  12/14/2008   ef 50-55%. SHOWED SIGNIFICANT STENOSIS AND TO DIAGONAL SIDE BRANCHES AND IN THE RIGHT VENTRICULAR MARGINAL BRANCH. THESE VESSELS WERE SMALL AND DIFFUSELY DISEASED   CARDIOVASCULAR STRESS  TEST  12/10/2009   EF 61%. EKG negative. Mild peri ischemia. Managed medically.    CARPAL TUNNEL RELEASE Right 11/2005   [name] 09/20/2010   CARPAL TUNNEL RELEASE Left 06/2007   [name] [id number]/132012   COLONOSCOPY  11/2004   [name] 09/20/2010   COLONOSCOPY W/ BIOPSIES AND POLYPECTOMY  09/2002   [name] 09/20/2010   CORONARY ARTERY BYPASS GRAFT N/A 04/02/2019   Procedure: CORONARY ARTERY BYPASS GRAFTING (CABG) using LIMA to LAD; Endoscopically harvested right greater saphenous vein to the PLB, Ramus, and Diag 2.;   Surgeon: Lajuana Matte, MD;  Location: [place];  Service: Open Heart Surgery;  Laterality: N/A;   INCISION AND DRAINAGE ABSCESS Right 10/18/2016   Procedure: INCISION AND DRAINAGE ABSCESS RIGHT SHOULDER;  Surgeon: [name], MD;  Location: [place];  Service: Orthopedics;  Laterality: Right;   INCISION AND DRAINAGE OF WOUND Right 08/2005   shoulder/notes 09/20/2010   IR FLUORO GUIDE CV LINE LEFT  10/19/2016   IR US GUIDE VASC ACCESS LEFT  10/19/2016   IRRIGATION AND DEBRIDEMENT SHOULDER Right 12/22/2016   Procedure: IRRIGATION AND DEBRIDEMENT RIGHT SHOULDER;  Surgeon: [name], MD;  Location: [place];  Service: Orthopedics;  Laterality: Right;   JOINT REPLACEMENT     LEFT HEART CATH AND CORONARY ANGIOGRAPHY N/A 08/16/2016   Procedure: Left Heart Cath and Coronary Angiography;  Surgeon: [name], MD;  Location: [place] CV LAB;  Service: Cardiovascular;  Laterality: N/A;   LEFT HEART CATH AND CORONARY ANGIOGRAPHY N/A 03/26/2019   Procedure: LEFT HEART CATH AND CORONARY ANGIOGRAPHY;  Surgeon: Martinique, Peter M, MD;  Location: [place] CV LAB;  Service: Cardiovascular;  Laterality: N/A;   LEFT HEART CATH AND CORS/GRAFTS ANGIOGRAPHY N/A 12/21/2020   Procedure: LEFT HEART CATH AND CORS/GRAFTS ANGIOGRAPHY;  Surgeon: Martinique, Peter M, MD;  Location: [place] CV LAB;  Service: Cardiovascular;  Laterality: N/A;   LEFT HEART CATHETERIZATION WITH CORONARY ANGIOGRAM N/A 08/09/2012   Procedure: LEFT HEART CATHETERIZATION WITH CORONARY ANGIOGRAM;  Surgeon: Peter M Martinique, MD;  Location: [facility] CATH LAB;  Service: Cardiovascular;  Laterality: N/A;   LUMBAR FUSION     NEPHRECTOMY Left early 2000s   REPLACEMENT TOTAL KNEE Right 08/2008   [name] 09/07/2010   SHOULDER ARTHROSCOPY WITH ROTATOR CUFF REPAIR Right 06/2005   [name] 09/20/2010   TOTAL KNEE ARTHROPLASTY  06/08/2011   Procedure: TOTAL KNEE ARTHROPLASTY;  Surgeon: [name], MD;  Location: [place];  Service:  Orthopedics;  Laterality: Left;   TOTAL SHOULDER ARTHROPLASTY Right 04/06/2016   Procedure: RIGHT REVERSE TOTAL SHOULDER ARTHROPLASTY;  Surgeon: [name], MD;  Location: [place];  Service: Orthopedics;  Laterality: Right;   TOTAL SHOULDER REVISION Right 12/22/2016   Procedure: RIGHT SHOULDER GLENOID AND HUMERAL COMPONENT REVISION;  Surgeon: [name], MD;  Location: [place];  Service: Orthopedics;  Laterality: Right;   US ECHOCARDIOGRAPHY  12/15/2008   EF 60-65%   US ECHOCARDIOGRAPHY  09/28/2004   EF 55-60%       Family History  Problem Relation Age of Onset   Heart attack Father    Heart disease Brother    Allergic rhinitis Neg Hx    Angioedema Neg Hx    Asthma Neg Hx    Atopy Neg Hx    Eczema Neg Hx    Immunodeficiency Neg Hx    Urticaria Neg Hx     Social History   Tobacco Use   Smoking status: Former  Types: Cigarettes    Quit date: 05/31/1989    Years since quitting: 31.5   Smokeless tobacco: Never   Tobacco comments:    08/15/2016 "hadn't smoked a carton of cigarettes all my life"  Vaping Use   Vaping Use: Never used  Substance Use Topics   Alcohol use: Yes    Alcohol/week: 0.0 standard drinks    Comment: "sometimes daily, sometimes weekly" beer   Drug use: No    Home Medications Prior to Admission medications   Medication Sig Start Date End Date Taking? Authorizing Provider  acyclovir (ZOVIRAX) 400 MG tablet Take 400 mg by mouth 3 (three) times daily as needed (For cold sores). . 10/05/16   [provider]  albuterol (VENTOLIN HFA) 108 (90 Base) MCG/ACT inhaler Inhale 2 puffs into the lungs every 6 (six) hours as needed for wheezing or shortness of breath. 08/06/20   [provider]  allopurinol (ZYLOPRIM) 300 MG tablet Take 300 mg by mouth daily as needed (for gout flares).     [provider]  aspirin EC 81 MG tablet Take 1 tablet (81 mg total) by mouth daily. 05/28/19   Martinique, Peter M, MD  atorvastatin (LIPITOR) 80 MG tablet  Take 1 tablet (80 mg total) by mouth every evening. 08/17/16   Cheryln Manly, NP  Fluticasone-Umeclidin-Vilant (TRELEGY ELLIPTA) 100-62.5-25 MCG/INH AEPB Inhale 1 puff into the lungs daily. 09/14/20   Olalere, Cicero Duck A, MD  furosemide (LASIX) 20 MG tablet TAKE 1 TABLET(20 MG) BY MOUTH DAILY Patient taking differently: Take 40 mg by mouth daily. 12/08/20   Martinique, Peter M, MD  HYDROcodone-acetaminophen (NORCO/VICODIN) 5-325 MG tablet Take 1 tablet by mouth 3 (three) times daily as needed for moderate pain.  02/11/20   [provider]  LORazepam (ATIVAN) 0.5 MG tablet Take 0.5 mg by mouth 2 (two) times daily as needed for anxiety.  08/07/15   [provider]  metoprolol tartrate (LOPRESSOR) 25 MG tablet Take 25 mg by mouth 2 (two) times daily.    [provider]  pantoprazole (PROTONIX) 40 MG tablet Take 40 mg by mouth every evening.     [provider]    Allergies    Adhesive [tape] and Latex  Review of Systems   Review of Systems  Constitutional:  Negative for appetite change, chills, fatigue and fever.  HENT:  Negative for congestion and sore throat.   Eyes:  Negative for visual disturbance.  Respiratory:  Negative for shortness of breath and wheezing.   Cardiovascular:  Negative for chest pain and palpitations.  Gastrointestinal:  Negative for abdominal pain, constipation, diarrhea, nausea and vomiting.  Genitourinary:  Negative for dysuria.  Musculoskeletal:  Negative for back pain, myalgias, neck pain and neck stiffness.  Skin:  Positive for color change and wound. Negative for rash.  Allergic/Immunologic: Negative for immunocompromised state.  Neurological:  Negative for seizures, syncope, weakness, numbness and headaches.  Psychiatric/Behavioral:  Negative for confusion.    Physical Exam Updated Vital Signs BP 131/72 (BP Location: Left Arm)   Pulse (!) 59   Temp 98.1 F (36.7 C) (Oral)   Resp 16   SpO2 99%   Physical Exam Vitals and  nursing note reviewed.  Constitutional:      Appearance: He is well-developed.  HENT:     Head: Normocephalic.  Eyes:     Conjunctiva/sclera: Conjunctivae normal.  Cardiovascular:     Rate and Rhythm: Normal rate and regular rhythm.     Heart sounds: No murmur  heard. Pulmonary:     Effort: Pulmonary effort is normal.  Abdominal:     General: There is no distension.     Palpations: Abdomen is soft.  Musculoskeletal:     Cervical back: Neck supple.  Skin:    General: Skin is warm and dry.     Comments: Swelling and ecchymosis noted to the left wrist, extending proximally up the left forearm.  Radial pulses are 2+ and symmetric.  Swelling has decreased from the line from the skin marker from previous evaluation.  Full active and passive range of motion of the left wrist.  Good capillary refill of the digits of the left hand.  Neurological:     Mental Status: He is alert.  Psychiatric:        Behavior: Behavior normal.          ED Results / Procedures / Treatments   Labs (all labs ordered are listed, but only abnormal results are displayed) Labs Reviewed - No data to display  EKG None  Radiology DG Chest 2 View  Result Date: 12/20/2020 CLINICAL DATA:  Chest pain. EXAM: CHEST - 2 VIEW COMPARISON:  None. FINDINGS: The heart size and mediastinal contours are within normal limits. Both lungs are clear. The visualized skeletal structures are unremarkable. IMPRESSION: No active cardiopulmonary disease. Electronically Signed   By: Dorise Bullion III M.D.   On: 12/20/2020 12:47   CARDIAC CATHETERIZATION  Addendum Date: 12/21/2020     Ost Cx to Mid Cx lesion is 80% stenosed.   Prox RCA lesion is 45% stenosed.   Ramus lesion is 90% stenosed.   2nd RPL lesion is 80% stenosed.   Mid LAD lesion is 65% stenosed.   Ost LM to Mid LM lesion is 40% stenosed.   2nd Diag lesion is 90% stenosed.   LIMA graft was visualized by angiography and is normal in caliber.   SVG graft was visualized by  angiography and is normal in caliber.   SVG graft was visualized by angiography and is normal in caliber.   The graft exhibits no disease.   The graft exhibits no disease.   The graft exhibits no disease.   LV end diastolic pressure is normal. 3 vessel obstructive CAD Patent LIMA - this is tied to the second diagonal and not the LAD Patent SVG to the ramus intermediate. No visualization of SVG to diagonal which per op note came off the hood of the SVG to ramus Patent SVG to the PL branch of the RCA Normal LVEDP Plan: there appears to be good blood flow to all major vessels. The LAD was not revascularized but has borderline disease and the patient had prior Myoview with no ischemia in this territory. Other grafts are patent. Recommend continued medical therapy. Does not appear to be volume overloaded. Echo pending.  Given significant difficulty from the radial approach I would recommend femoral access for any future Cardiac cath procedures.  Result Date: 12/21/2020   Colon Flattery Cx to Mid Cx lesion is 80% stenosed.   Prox RCA lesion is 45% stenosed.   Ramus lesion is 90% stenosed.   2nd RPL lesion is 80% stenosed.   Mid LAD lesion is 65% stenosed.   Ost LM to Mid LM lesion is 40% stenosed.   2nd Diag lesion is 90% stenosed.   LIMA graft was visualized by angiography and is normal in caliber.   SVG graft was visualized by angiography and is normal in caliber.   SVG graft was visualized  by angiography and is normal in caliber.   The graft exhibits no disease.   The graft exhibits no disease.   The graft exhibits no disease.   LV end diastolic pressure is normal. 3 vessel obstructive CAD Patent LIMA - this is tied to the second diagonal and not the LAD Patent SVG to the ramus intermediate. No visualization of SVG to diagonal which per op note came off the hood of the SVG to ramus Patent SVG to the PL branch of the RCA Normal LVEDP Plan: there appears to be good blood flow to all major vessels. The LAD was not revascularized  but has borderline disease and the patient had prior Myoview with no ischemia in this territory. Other grafts are patent. Recommend continued medical therapy. Does not appear to be volume overloaded. Echo pending.    Procedures Procedures   Medications Ordered in ED Medications - No data to display  ED Course  I have reviewed the triage vital signs and the nursing notes.  Pertinent labs & imaging results that were available during my care of the patient were reviewed by me and considered in my medical decision making (see chart for details).    MDM Rules/Calculators/A&P                           80 year old male with a history of CAD, hyperlipidemia, degenerative disc disease, and stroke who presents the emergency department with left wrist and forearm bruising and swelling after a left radial cardiac catheterization this afternoon.  Vital signs are unremarkable.  Patient was originally seen in triage and had a Coban pressure dressing applied.  I initially evaluated this patient in triage and swelling had not improved.  PA couture spoke with cardiology when the patient first presented to the ED who recommended pressure and ice.On second valuation, swelling was marked with a skin marker.  He was given an ice pack and instructed to elevate his extremity while in the waiting room.  Unfortunately, the patient waited in the waiting room for several hours due to long wait times from boarding in the ED.   Doubt cellulitis and myositis, phlebitis or DVT.   After 1 hour, patient was reassessed and swelling and ecchymosis was improving.  Discussed the patient with Dr. Humphrey Rolls, Cardiology.  Since swelling is improving and patient has strong radial pulses and is neurovascularly intact, he can be discharged to home with continued ice packs and applying an Ace wrap to the extremity.  He would like him to be reevaluated in the cardiology office in 2 days.  Patient is agreeable with this plan as he is feeling  improved.  He should return to the emergency department if he develops worsening swelling.  ER return precautions given.  He is hemodynamically stable in no acute distress.  Safe for discharge with outpatient follow-up as discussed.  Final Clinical Impression(s) / ED Diagnoses Final diagnoses:  Postoperative hematoma involving circulatory system following cardiac catheterization    Rx / DC Orders ED Discharge Orders     None        Joanne Gavel, PA-C 12/22/20 0734    Ezequiel Essex, MD 12/22/20 [phone]

## 2020-12-22 NOTE — Telephone Encounter (Signed)
Patient's wife is following up. She states on 12/20/20 patient had heart cath and developed the swelling on wrist by the next day, 12/21/20. Again, patient's wife reports, "it looked like an egg." She states they went to the ED, as documented below, waited for about 6 hours, and finally patient's wrist was iced while admitted. Swelling decreased, patient advised of when to contact EMS and sent home. Patient's wife is requesting a call back to discuss further.  Pt c/o swelling: STAT is pt has developed SOB within 24 hours  How much weight have you gained and in what time span?  No noticeable weight gain  If swelling, where is the swelling located?  Wrist   Are you currently taking a fluid pill?  No   Are you currently SOB?  No   Do you have a log of your daily weights (if so, list)?  No   Have you gained 3 pounds in a day or 5 pounds in a week?  No   Have you traveled recently?  No, patient has cath on 12/20/20

## 2020-12-22 NOTE — ED Notes (Signed)
Provider discharged from triage.

## 2020-12-22 NOTE — Telephone Encounter (Addendum)
Spoke with patient's wife - Martinique MD patient - cath on 12/21/20  She is upset that patient waited 6 hours before cath, waited 6 hours in ED yesterday  Has post-procedure hematoma per ED diagnosis   They draw a line on his arm yesterday and his swelling has extended beyond this   Wife said she was advised to obtain ACE wrap and swelling got worse once this was applied   EDP note states: 9:10 PM CONSULT with Dr. Humphrey Rolls with cardiology. Recommends applying pressure. Recommends vascular US of the LUE if they are available. If unavailable then he can get this on a nonemergent basis.   Discussed with secretary who states there is no on call vascular US tech today  Advised wife/patient I will run this by MD to see if OK to order LUE u/s -- it looked as if there was availability for this to be scheduled this afternoon or tmr at [facility] office

## 2020-12-22 NOTE — Discharge Instructions (Addendum)
Thank you for allowing me to care for you today in the Emergency Department.   Call to schedule a follow up appointment in Dr. Doug Sou office in 1-2 days for a recheck.  Apply an ice pack for 15-20 minutes up to 3-4 times per day for the next 3-4 days.You can elevate your arm above the level of your heart to help with swelling.   Apply an ace wrap (available over the counter) to apply some to the area, but not too tight.   Return to the ER if the swelling worsens about the line we drew in the ER, if you start having chest pain shortness of breath, or other new concerning symptoms.

## 2020-12-22 NOTE — Telephone Encounter (Signed)
Received verbal from MD to order LUE arterial duplex  Ordered placed Message sent to [facility] [place] to contact patient for appointment today or tomorrow

## 2020-12-24 ENCOUNTER — Other Ambulatory Visit: Payer: Self-pay

## 2020-12-24 DIAGNOSIS — I25118 Atherosclerotic heart disease of native coronary artery with other forms of angina pectoris: Secondary | ICD-10-CM

## 2020-12-24 DIAGNOSIS — H8110 Benign paroxysmal vertigo, unspecified ear: Secondary | ICD-10-CM | POA: Diagnosis not present

## 2020-12-24 DIAGNOSIS — R079 Chest pain, unspecified: Secondary | ICD-10-CM | POA: Diagnosis not present

## 2020-12-24 DIAGNOSIS — R0981 Nasal congestion: Secondary | ICD-10-CM | POA: Diagnosis not present

## 2020-12-24 DIAGNOSIS — I25119 Atherosclerotic heart disease of native coronary artery with unspecified angina pectoris: Secondary | ICD-10-CM | POA: Diagnosis not present

## 2020-12-24 LAB — BASIC METABOLIC PANEL
BUN/Creatinine Ratio: 12 (ref 10–24)
BUN: 13 mg/dL (ref 8–27)
CO2: 20 mmol/L (ref 20–29)
Calcium: 9.3 mg/dL (ref 8.6–10.2)
Chloride: 104 mmol/L (ref 96–106)
Creatinine, Ser: 1.11 mg/dL (ref 0.76–1.27)
Glucose: 101 mg/dL — ABNORMAL HIGH (ref 65–99)
Potassium: 4.5 mmol/L (ref 3.5–5.2)
Sodium: 139 mmol/L (ref 134–144)
eGFR: 68 mL/min/{1.73_m2} (ref >59)

## 2020-12-25 DIAGNOSIS — K219 Gastro-esophageal reflux disease without esophagitis: Secondary | ICD-10-CM | POA: Diagnosis not present

## 2020-12-25 DIAGNOSIS — I10 Essential (primary) hypertension: Secondary | ICD-10-CM | POA: Diagnosis not present

## 2020-12-25 DIAGNOSIS — I2510 Atherosclerotic heart disease of native coronary artery without angina pectoris: Secondary | ICD-10-CM | POA: Diagnosis not present

## 2020-12-25 DIAGNOSIS — E785 Hyperlipidemia, unspecified: Secondary | ICD-10-CM | POA: Diagnosis not present

## 2021-01-07 ENCOUNTER — Other Ambulatory Visit: Payer: Self-pay

## 2021-01-07 MED ORDER — FUROSEMIDE 20 MG PO TABS: ORAL_TABLET | ORAL | 5 refills | Status: DC

## 2021-01-08 ENCOUNTER — Ambulatory Visit (HOSPITAL_COMMUNITY)
Admission: RE | Admit: 2021-01-08 | Discharge: 2021-01-08 | Disposition: A | Discharge status: Home / Self Care | Payer: Medicare Other | Source: Ambulatory Visit | Attending: Pulmonary Disease | Admitting: Pulmonary Disease

## 2021-01-08 ENCOUNTER — Other Ambulatory Visit: Payer: Self-pay

## 2021-01-08 ENCOUNTER — Other Ambulatory Visit (HOSPITAL_COMMUNITY): Payer: Medicare Other

## 2021-01-08 DIAGNOSIS — Z8673 Personal history of transient ischemic attack (TIA), and cerebral infarction without residual deficits: Secondary | ICD-10-CM | POA: Insufficient documentation

## 2021-01-08 DIAGNOSIS — E785 Hyperlipidemia, unspecified: Secondary | ICD-10-CM | POA: Insufficient documentation

## 2021-01-08 DIAGNOSIS — R0602 Shortness of breath: Secondary | ICD-10-CM | POA: Diagnosis not present

## 2021-01-08 DIAGNOSIS — I2510 Atherosclerotic heart disease of native coronary artery without angina pectoris: Secondary | ICD-10-CM | POA: Insufficient documentation

## 2021-01-08 DIAGNOSIS — R0600 Dyspnea, unspecified: Secondary | ICD-10-CM | POA: Diagnosis not present

## 2021-01-08 DIAGNOSIS — I517 Cardiomegaly: Secondary | ICD-10-CM | POA: Diagnosis not present

## 2021-01-08 DIAGNOSIS — Z951 Presence of aortocoronary bypass graft: Secondary | ICD-10-CM | POA: Diagnosis not present

## 2021-01-08 DIAGNOSIS — I358 Other nonrheumatic aortic valve disorders: Secondary | ICD-10-CM | POA: Insufficient documentation

## 2021-01-08 LAB — ECHOCARDIOGRAM COMPLETE
Area-P 1/2: 2.7 cm2
S' Lateral: 3.3 cm

## 2021-01-12 ENCOUNTER — Ambulatory Visit (INDEPENDENT_AMBULATORY_CARE_PROVIDER_SITE_OTHER): Payer: Medicare Other | Admitting: Pulmonary Disease

## 2021-01-12 ENCOUNTER — Other Ambulatory Visit: Payer: Self-pay

## 2021-01-12 DIAGNOSIS — R0602 Shortness of breath: Secondary | ICD-10-CM

## 2021-01-12 LAB — PULMONARY FUNCTION TEST
DL/VA % pred: 83 %
DL/VA: 3.27 ml/min/mmHg/L
DLCO cor % pred: 78 %
DLCO cor: 18.82 ml/min/mmHg
DLCO unc % pred: 79 %
DLCO unc: 18.97 ml/min/mmHg
FEF 25-75 Post: 2.45 L/sec
FEF 25-75 Pre: 1.9 L/sec
FEF2575-%Change-Post: 28 %
FEF2575-%Pred-Post: 123 %
FEF2575-%Pred-Pre: 95 %
FEV1-%Change-Post: 6 %
FEV1-%Pred-Post: 99 %
FEV1-%Pred-Pre: 93 %
FEV1-Post: 2.8 L
FEV1-Pre: 2.63 L
FEV1FVC-%Change-Post: 2 %
FEV1FVC-%Pred-Pre: 100 %
FEV6-%Change-Post: 4 %
FEV6-%Pred-Post: 100 %
FEV6-%Pred-Pre: 96 %
FEV6-Post: 3.71 L
FEV6-Pre: 3.56 L
FEV6FVC-%Change-Post: 0 %
FEV6FVC-%Pred-Post: 105 %
FEV6FVC-%Pred-Pre: 106 %
FVC-%Change-Post: 3 %
FVC-%Pred-Post: 95 %
FVC-%Pred-Pre: 92 %
FVC-Post: 3.76 L
FVC-Pre: 3.63 L
Post FEV1/FVC ratio: 74 %
Post FEV6/FVC ratio: 99 %
Pre FEV1/FVC ratio: 72 %
Pre FEV6/FVC Ratio: 99 %

## 2021-01-12 NOTE — Progress Notes (Signed)
PFT done today. 

## 2021-01-15 ENCOUNTER — Ambulatory Visit: Payer: Medicare Other | Admitting: Adult Health

## 2021-01-18 ENCOUNTER — Encounter: Payer: Self-pay | Admitting: Pulmonary Disease

## 2021-01-18 ENCOUNTER — Ambulatory Visit: Payer: Medicare Other | Admitting: Pulmonary Disease

## 2021-01-18 ENCOUNTER — Other Ambulatory Visit: Payer: Self-pay

## 2021-01-18 VITALS — BP 146/76 | HR 69 | Temp 97.9°F | Ht 68.0 in | Wt 231.0 lb

## 2021-01-18 DIAGNOSIS — R0602 Shortness of breath: Secondary | ICD-10-CM | POA: Diagnosis not present

## 2021-01-18 MED ORDER — DOXYCYCLINE HYCLATE 100 MG PO TABS: 100.0000 mg | ORAL_TABLET | Freq: Two times a day (BID) | ORAL | 0 refills | Status: DC

## 2021-01-18 NOTE — Patient Instructions (Signed)
I will see you back in about 3 months  Doxycycline 100 p.o. twice daily for 10 days  Call with significant concerns  Continue Trelegy

## 2021-01-18 NOTE — Progress Notes (Signed)
Jake Dunn    [id number]    1940/06/29  Primary Care Physician:White, Caren Griffins, MD  Referring Physician: Harlan Stains, MD [place],  [place] [address]  Chief complaint:   Patient is being seen for follow-up of shortness of breath  HPI:  Shortness of breath is a little bit better since last time he was here  He has had COVID twice  History of heart disease, cardiac surgery previously  He still has a cough, brings up greenish phlegm Has no fevers or chills Denies any chest pain or chest discomfort  Remote smoking history quit in [place] feels overall better since starting Trelegy  \He does have chronic musculoskeletal pains-5 back surgeries Both knees replaced  Periodic use of albuterol-occasionally helps  History of coronary artery disease, atrial fibrillation-follows up with cardiology He had a stress test recently which was okay  No pertinent occupational history And environmental exposures contributing to shortness of breath  Outpatient Encounter Medications as of 01/18/2021  Medication Sig   acyclovir (ZOVIRAX) 400 MG tablet Take 400 mg by mouth 3 (three) times daily as needed (For cold sores). Marland Kitchen   albuterol (VENTOLIN HFA) 108 (90 Base) MCG/ACT inhaler Inhale 2 puffs into the lungs every 6 (six) hours as needed for wheezing or shortness of breath.   allopurinol (ZYLOPRIM) 300 MG tablet Take 300 mg by mouth daily as needed (for gout flares).    aspirin EC 81 MG tablet Take 1 tablet (81 mg total) by mouth daily.   atorvastatin (LIPITOR) 80 MG tablet Take 1 tablet (80 mg total) by mouth every evening.   Fluticasone-Umeclidin-Vilant (TRELEGY ELLIPTA) 100-62.5-25 MCG/INH AEPB Inhale 1 puff into the lungs daily.   furosemide (LASIX) 20 MG tablet TAKE 1 TABLET(20 MG) BY MOUTH DAILY   HYDROcodone-acetaminophen (NORCO/VICODIN) 5-325 MG tablet Take 1 tablet by mouth 3 (three) times daily as needed for moderate pain.     LORazepam (ATIVAN) 0.5 MG tablet Take 0.5 mg by mouth 2 (two) times daily as needed for anxiety.    metoprolol tartrate (LOPRESSOR) 25 MG tablet Take 25 mg by mouth 2 (two) times daily.   pantoprazole (PROTONIX) 40 MG tablet Take 40 mg by mouth every evening.    No facility-administered encounter medications on file as of 01/18/2021.    Allergies as of 01/18/2021 - Review Complete 01/18/2021  Allergen Reaction Noted   Latex Rash 05/25/2011   Tape Rash 05/25/2011    Past Medical History:  Diagnosis Date   Adenomatous polyp    Anxiety    Arthritis    "knees, ankles, shoulders" (08/15/2016)   CAD (coronary artery disease)    s/p cath in 2014 showing significant stenosis in the diagonal side branches and in the RV marginal branch. These vessels were small and diffusely diseased. He is managed medically. 08/16/16 Cath, no disease progression   Chronic lower back pain    Disc disease, degenerative, cervical    GERD (gastroesophageal reflux disease)    Gout    Hyperlipidemia    Hypertension    Osteoarthritis    Renal cell carcinoma    left kidney removal   Stroke [facility])    MINI STROKE 15+ YRS AGO, no residual   Stroke ([facility])    "I've had 2 or 3"; denies residual on 08/15/2016   Syncope and collapse     Past Surgical History:  Procedure Laterality Date   ANTERIOR CERVICAL DECOMP/DISCECTOMY FUSION  02/2004;  04/2005   [name] 09/20/2010; [name] [id number]   APPLICATION OF ROBOTIC ASSISTANCE FOR SPINAL PROCEDURE N/A 01/02/2018   Procedure: APPLICATION OF ROBOTIC ASSISTANCE FOR SPINAL PROCEDURE;  Surgeon: Kristeen Miss, MD;  Location: [place];  Service: Neurosurgery;  Laterality: N/A;   BACK SURGERY     CARDIAC CATHETERIZATION  12/14/2008   ef 50-55%. SHOWED SIGNIFICANT STENOSIS AND TO DIAGONAL SIDE BRANCHES AND IN THE RIGHT VENTRICULAR MARGINAL BRANCH. THESE VESSELS WERE SMALL AND DIFFUSELY DISEASED   CARDIOVASCULAR STRESS TEST  12/10/2009   EF 61%. EKG negative. Mild peri ischemia. Managed  medically.    CARPAL TUNNEL RELEASE Right 11/2005   [name] 09/20/2010   CARPAL TUNNEL RELEASE Left 06/2007   [name] 5/132012   COLONOSCOPY  11/2004   [name] 09/20/2010   COLONOSCOPY W/ BIOPSIES AND POLYPECTOMY  09/2002   [name] 09/20/2010   CORONARY ARTERY BYPASS GRAFT N/A 04/02/2019   Procedure: CORONARY ARTERY BYPASS GRAFTING (CABG) using LIMA to LAD; Endoscopically harvested right greater saphenous vein to the PLB, Ramus, and Diag 2.;  Surgeon: Lajuana Matte, MD;  Location: [place];  Service: Open Heart Surgery;  Laterality: N/A;   INCISION AND DRAINAGE ABSCESS Right 10/18/2016   Procedure: INCISION AND DRAINAGE ABSCESS RIGHT SHOULDER;  Surgeon: [name], MD;  Location: [place];  Service: Orthopedics;  Laterality: Right;   INCISION AND DRAINAGE OF WOUND Right 08/2005   shoulder/notes 09/20/2010   IR FLUORO GUIDE CV LINE LEFT  10/19/2016   IR US GUIDE VASC ACCESS LEFT  10/19/2016   IRRIGATION AND DEBRIDEMENT SHOULDER Right 12/22/2016   Procedure: IRRIGATION AND DEBRIDEMENT RIGHT SHOULDER;  Surgeon: [name], MD;  Location: [place];  Service: Orthopedics;  Laterality: Right;   JOINT REPLACEMENT     LEFT HEART CATH AND CORONARY ANGIOGRAPHY N/A 08/16/2016   Procedure: Left Heart Cath and Coronary Angiography;  Surgeon: [name], MD;  Location: [place] [facility];  Service: Cardiovascular;  Laterality: N/A;   LEFT HEART CATH AND CORONARY ANGIOGRAPHY N/A 03/26/2019   Procedure: LEFT HEART CATH AND CORONARY ANGIOGRAPHY;  Surgeon: Martinique, Peter M, MD;  Location: [place] [facility];  Service: Cardiovascular;  Laterality: N/A;   LEFT HEART CATH AND CORS/GRAFTS ANGIOGRAPHY N/A 12/21/2020   Procedure: LEFT HEART CATH AND CORS/GRAFTS ANGIOGRAPHY;  Surgeon: Martinique, Peter M, MD;  Location: [place] [facility];  Service: Cardiovascular;  Laterality: N/A;   LEFT HEART CATHETERIZATION WITH CORONARY ANGIOGRAM N/A 08/09/2012   Procedure: LEFT HEART CATHETERIZATION WITH  CORONARY ANGIOGRAM;  Surgeon: Peter M Martinique, MD;  Location: [facility] C[facility];  Service: Cardiovascular;  Laterality: N/A;   LUMBAR FUSION     NEPHRECTOMY Left early 2000s   REPLACEMENT TOTAL KNEE Right 08/2008   [name] 09/07/2010   SHOULDER ARTHROSCOPY WITH ROTATOR CUFF REPAIR Right 06/2005   [name] 09/20/2010   TOTAL KNEE ARTHROPLASTY  06/08/2011   Procedure: TOTAL KNEE ARTHROPLASTY;  Surgeon: [name], MD;  Location: [place];  Service: Orthopedics;  Laterality: Left;   TOTAL SHOULDER ARTHROPLASTY Right 04/06/2016   Procedure: RIGHT REVERSE TOTAL SHOULDER ARTHROPLASTY;  Surgeon: [name], MD;  Location: [place];  Service: Orthopedics;  Laterality: Right;   TOTAL SHOULDER REVISION Right 12/22/2016   Procedure: RIGHT SHOULDER GLENOID AND HUMERAL COMPONENT REVISION;  Surgeon: [name], MD;  Location: [place];  Service: Orthopedics;  Laterality: Right;   US ECHOCARDIOGRAPHY  12/15/2008   EF 60-65%   US ECHOCARDIOGRAPHY  09/28/2004   EF 55-60%  Family History  Problem Relation Age of Onset   Heart attack Father    Heart disease Brother    Allergic rhinitis Neg Hx    Angioedema Neg Hx    Asthma Neg Hx    Atopy Neg Hx    Eczema Neg Hx    Immunodeficiency Neg Hx    Urticaria Neg Hx     Social History   Socioeconomic History   Marital status: Married    Spouse name: Not on file   Number of children: Not on file   Years of education: Not on file   Highest education level: Not on file  Occupational History   Not on file  Tobacco Use   Smoking status: Former    Types: Cigarettes    Quit date: 05/31/1989    Years since quitting: 31.6   Smokeless tobacco: Never   Tobacco comments:    08/15/2016 "hadn't smoked a carton of cigarettes all my life"  Vaping Use   Vaping Use: Never used  Substance and Sexual Activity   Alcohol use: Yes    Alcohol/week: 0.0 standard drinks    Comment: "sometimes daily, sometimes weekly" beer   Drug use: No   Sexual activity: Not Currently   Other Topics Concern   Not on file  Social History Narrative   Not on file   Social Determinants of Health   Financial Resource Strain: Not on file  Food Insecurity: Not on file  Transportation Needs: Not on file  Physical Activity: Not on file  Stress: Not on file  Social Connections: Not on file  Intimate Partner Violence: Not on file    Review of Systems  Constitutional:  Negative for fatigue.  Respiratory:  Positive for shortness of breath. Negative for cough.   Cardiovascular:  Negative for chest pain.  Musculoskeletal:  Positive for arthralgias and back pain.   Vitals:   01/18/21 1127  BP: (!) 146/76  Pulse: 69  Temp: 97.9 F (36.6 C)  SpO2: 98%     Physical Exam Constitutional:      Appearance: He is obese.  HENT:     Head: Normocephalic.     Mouth/Throat:     Mouth: Mucous membranes are moist.  Eyes:     Pupils: Pupils are equal, round, and reactive to light.  Cardiovascular:     Rate and Rhythm: Normal rate and regular rhythm.     Heart sounds: No murmur heard.   No friction rub.  Pulmonary:     Effort: No respiratory distress.     Breath sounds: No stridor. No wheezing, rhonchi or rales.  Musculoskeletal:     Cervical back: No rigidity or tenderness.  Neurological:     Mental Status: He is alert.  Psychiatric:        Mood and Affect: Mood normal.   Data Reviewed:  Chest x-ray April 2021-no acute infiltrate  Pulmonary function test reviewed showing no significant obstruction, no bronchodilator response Some scooping of the flow volume loop may suggest mild obstructive defect Mild restriction  Myocardial perfusion scan Myocardial perfusion is normal.   This is a low risk study.  Overall left ventricular systolic function was abnormal.    Nuclear stress EF:  48%.  The left ventricular ejection fraction is mildly decreased (45-54%).    Echocardiogram from a year ago showed ejection fraction of 60 to 65% No mention of pulmonary  hypertension  -Repeat echocardiogram shows ejection fraction of 50 to [id number] with diastolic [id number]  Assessment:  Shortness of breath -Better with use of Trelegy -With symptomatic improvement in symptoms we will continue Trelegy -Still has a cough with sputum production so may have some degree of bronchitis, mild obstructive lung disease with a history of smoking in the past  Does have a history of coronary artery disease -Ejection fraction currently is a little bit more reduced compared to previously -Some diastolic dysfunction on echocardiogram  Musculoskeletal pain and discomfort -This may be contributing to symptoms -Shortness of breath is quite significant and causes significant limitations at present  Obesity -May be contributing to symptoms   Plan/Recommendations: .  Continue Trelegy  .  We will give him a course of doxycycline for bronchitis  .  Graded exercise as tolerated  .  Continue albuterol use as needed  .  I will see him back in about 3 months  Encouraged to call with any significant concerns    Sherrilyn Rist MD [facility] 01/18/2021, 11:36 AM  CC: Harlan Stains, MD

## 2021-01-22 ENCOUNTER — Telehealth: Payer: Self-pay | Admitting: Pulmonary Disease

## 2021-01-22 NOTE — Telephone Encounter (Signed)
I called the pts wife and she is aware that no order for the scale has been sent in by out office.  She stated that she will keep trying to figure it out.  I advised her to call [employer] back and have them pick this up and send it back. She will follow up on that.

## 2021-01-25 ENCOUNTER — Telehealth: Payer: Self-pay | Admitting: Cardiology

## 2021-01-25 NOTE — Telephone Encounter (Signed)
Returned call to patient's wife she wanted to know if Dr.Jordan ordered cellular scales.Advised he did not order.

## 2021-01-25 NOTE — Telephone Encounter (Signed)
New Message:       Patient's wife calld and said last week he received an [employer] Cellular Weight Scales. She said she have no idea who ordered it.

## 2021-01-27 DIAGNOSIS — Z23 Encounter for immunization: Secondary | ICD-10-CM | POA: Diagnosis not present

## 2021-02-16 DIAGNOSIS — I10 Essential (primary) hypertension: Secondary | ICD-10-CM | POA: Diagnosis not present

## 2021-02-16 DIAGNOSIS — E785 Hyperlipidemia, unspecified: Secondary | ICD-10-CM | POA: Diagnosis not present

## 2021-02-16 DIAGNOSIS — M109 Gout, unspecified: Secondary | ICD-10-CM | POA: Diagnosis not present

## 2021-02-16 DIAGNOSIS — G894 Chronic pain syndrome: Secondary | ICD-10-CM | POA: Diagnosis not present

## 2021-02-18 ENCOUNTER — Other Ambulatory Visit: Payer: Self-pay | Admitting: Family Medicine

## 2021-02-18 DIAGNOSIS — N6320 Unspecified lump in the left breast, unspecified quadrant: Secondary | ICD-10-CM

## 2021-03-10 DIAGNOSIS — Z86010 Personal history of colonic polyps: Secondary | ICD-10-CM | POA: Diagnosis not present

## 2021-03-10 DIAGNOSIS — D124 Benign neoplasm of descending colon: Secondary | ICD-10-CM | POA: Diagnosis not present

## 2021-03-10 DIAGNOSIS — D122 Benign neoplasm of ascending colon: Secondary | ICD-10-CM | POA: Diagnosis not present

## 2021-03-10 DIAGNOSIS — D123 Benign neoplasm of transverse colon: Secondary | ICD-10-CM | POA: Diagnosis not present

## 2021-03-10 DIAGNOSIS — D120 Benign neoplasm of cecum: Secondary | ICD-10-CM | POA: Diagnosis not present

## 2021-03-10 DIAGNOSIS — D121 Benign neoplasm of appendix: Secondary | ICD-10-CM | POA: Diagnosis not present

## 2021-03-12 DIAGNOSIS — D120 Benign neoplasm of cecum: Secondary | ICD-10-CM | POA: Diagnosis not present

## 2021-03-12 DIAGNOSIS — D123 Benign neoplasm of transverse colon: Secondary | ICD-10-CM | POA: Diagnosis not present

## 2021-03-12 DIAGNOSIS — D122 Benign neoplasm of ascending colon: Secondary | ICD-10-CM | POA: Diagnosis not present

## 2021-03-12 DIAGNOSIS — D121 Benign neoplasm of appendix: Secondary | ICD-10-CM | POA: Diagnosis not present

## 2021-03-24 DIAGNOSIS — I480 Paroxysmal atrial fibrillation: Secondary | ICD-10-CM | POA: Diagnosis not present

## 2021-03-24 DIAGNOSIS — I10 Essential (primary) hypertension: Secondary | ICD-10-CM | POA: Diagnosis not present

## 2021-03-24 DIAGNOSIS — K219 Gastro-esophageal reflux disease without esophagitis: Secondary | ICD-10-CM | POA: Diagnosis not present

## 2021-03-24 DIAGNOSIS — E785 Hyperlipidemia, unspecified: Secondary | ICD-10-CM | POA: Diagnosis not present

## 2021-03-26 ENCOUNTER — Other Ambulatory Visit: Payer: Self-pay

## 2021-03-26 ENCOUNTER — Ambulatory Visit
Admission: RE | Admit: 2021-03-26 | Discharge: 2021-03-26 | Disposition: A | Discharge status: Home / Self Care | Payer: Medicare Other | Source: Ambulatory Visit | Attending: Family Medicine | Admitting: Family Medicine

## 2021-03-26 ENCOUNTER — Ambulatory Visit: Admission: RE | Admit: 2021-03-26 | Payer: Medicare Other | Source: Ambulatory Visit

## 2021-03-26 DIAGNOSIS — N62 Hypertrophy of breast: Secondary | ICD-10-CM | POA: Diagnosis not present

## 2021-03-26 DIAGNOSIS — N6320 Unspecified lump in the left breast, unspecified quadrant: Secondary | ICD-10-CM

## 2021-04-12 NOTE — Progress Notes (Deleted)
Cardiology Office Note   Date:  04/12/2021   ID:  Jake Dunn 18-Apr-1941, MRN [id number]  PCP:  Harlan Stains, MD  Cardiologist:   Revonda Menter Martinique, MD   No chief complaint on file.     History of Present Illness: Jake Dunn is a 80 y.o. male who is seen for follow up CAD s/p CABG. He has a  PMH of CAD, GERD, HTN, HLD, renal cell CA s/p left nephrectomy. Cardiac catheterization in 2014 showed nonobstructive CAD with 70% lesion in third diagonal that was treated medically.    He presented with left sided chest pressure on 08/15/2016. He underwent cardiac catheterization on 08/16/2016, this revealed two-vessel CAD with moderate calcified stenosis in the mid LAD, second diagonal, ramus intermedius, ostial and proximal left circumflex and a severe stenosis of the second PLA branch of the RCA. Overall normal LVEDP and normal LVEF. Recommended  medical therapy and outpatient exercise stress Myoview. While the LAD and the diagonal only showed moderate progression compared to the 2010/2014 studies, the left circumflex and intermediate stenosis had progressed. Echocardiogram obtained on 08/17/2016 showed EF 81-19%, grade 2 diastolic dysfunction, severely dilated left atrium, PA peak pressure 30 mmHg. Lexiscan Myoview obtained on 08/23/2016 showed EF 51%, no perfusion defect at stress, overall low risk study.     He was seen in November 2020 with symptoms of chest pain and dyspnea. This led to a cardiac cath showing 3 vessel obstructive CAD. He underwent CABG on 04/02/19 by Dr Jake Dunn. CABG X 4.  LIMA LAD, RSVG PLV, Ramus, and D2  (T graft off the ramus vein graft). Post op course complicated by Afib. He was loaded with amiodarone. On his  visit in January 2021 amiodarone and Eliquis were discontinued.   He did have a sleep study showing few hyponeic episodes without frank apnea. Did not meet criteria for CPAP trial.   He was admitted in August with chest pain. Troponins were negative. He  underwent cardiac cath showing no new disease. Medical therapy recommended. Repeat Echo in September showed EF 50-55%.   On follow up today he notes his SOB has improved significantly. He does report intermittent chest pain that will shoot through his chest. Typically goes away quickly. Not always associated with activity. Does feel somewhat like symptoms he had prior to bypass.   He is walking regularly and doing yard work.   No edema.     Past Medical History:  Diagnosis Date   Adenomatous polyp    Anxiety    Arthritis    "knees, ankles, shoulders" (08/15/2016)   CAD (coronary artery disease)    s/p cath in 2014 showing significant stenosis in the diagonal side branches and in the RV marginal branch. These vessels were small and diffusely diseased. He is managed medically. 08/16/16 Cath, no disease progression   Chronic lower back pain    Disc disease, degenerative, cervical    GERD (gastroesophageal reflux disease)    Gout    Hyperlipidemia    Hypertension    Osteoarthritis    Renal cell carcinoma    left kidney removal   Stroke [facility])    MINI STROKE 15+ YRS AGO, no residual   Stroke ([place])    "I've had 2 or 3"; denies residual on 08/15/2016   Syncope and collapse     Past Surgical History:  Procedure Laterality Date   ANTERIOR CERVICAL DECOMP/DISCECTOMY FUSION  02/2004; 04/2005   [name] 09/20/2010; [name] 1/47/8295   APPLICATION OF  ROBOTIC ASSISTANCE FOR SPINAL PROCEDURE N/A 01/02/2018   Procedure: APPLICATION OF ROBOTIC ASSISTANCE FOR SPINAL PROCEDURE;  Surgeon: Jake Miss, MD;  Location: [place];  Service: Neurosurgery;  Laterality: N/A;   BACK SURGERY     CARDIAC CATHETERIZATION  12/14/2008   ef 50-55%. SHOWED SIGNIFICANT STENOSIS AND TO DIAGONAL SIDE BRANCHES AND IN THE RIGHT VENTRICULAR MARGINAL BRANCH. THESE VESSELS WERE SMALL AND DIFFUSELY DISEASED   CARDIOVASCULAR STRESS TEST  12/10/2009   EF 61%. EKG negative. Mild peri ischemia. Managed medically.    CARPAL TUNNEL RELEASE  Right 11/2005   [name] 09/20/2010   CARPAL TUNNEL RELEASE Left 06/2007   [name] 5/132012   COLONOSCOPY  11/2004   [name] 09/20/2010   COLONOSCOPY W/ BIOPSIES AND POLYPECTOMY  09/2002   [name] 09/20/2010   CORONARY ARTERY BYPASS GRAFT N/A 04/02/2019   Procedure: CORONARY ARTERY BYPASS GRAFTING (CABG) using LIMA to LAD; Endoscopically harvested right greater saphenous vein to the PLB, Ramus, and Diag 2.;  Surgeon: Jake Matte, MD;  Location: [place];  Service: Open Heart Surgery;  Laterality: N/A;   INCISION AND DRAINAGE ABSCESS Right 10/18/2016   Procedure: INCISION AND DRAINAGE ABSCESS RIGHT SHOULDER;  Surgeon: Jake Butters, MD;  Location: [place];  Service: Orthopedics;  Laterality: Right;   INCISION AND DRAINAGE OF WOUND Right 08/2005   shoulder/notes 09/20/2010   IR FLUORO GUIDE CV LINE LEFT  10/19/2016   IR US GUIDE VASC ACCESS LEFT  10/19/2016   IRRIGATION AND DEBRIDEMENT SHOULDER Right 12/22/2016   Procedure: IRRIGATION AND DEBRIDEMENT RIGHT SHOULDER;  Surgeon: Jake Lights, MD;  Location: [place];  Service: Orthopedics;  Laterality: Right;   JOINT REPLACEMENT     LEFT HEART CATH AND CORONARY ANGIOGRAPHY N/A 08/16/2016   Procedure: Left Heart Cath and Coronary Angiography;  Surgeon: Jake Mocha, MD;  Location: [place] CV [facility];  Service: Cardiovascular;  Laterality: N/A;   LEFT HEART CATH AND CORONARY ANGIOGRAPHY N/A 03/26/2019   Procedure: LEFT HEART CATH AND CORONARY ANGIOGRAPHY;  Surgeon: Dunn, Kendra Grissett M, MD;  Location: [place] CV [facility];  Service: Cardiovascular;  Laterality: N/A;   LEFT HEART CATH AND CORS/GRAFTS ANGIOGRAPHY N/A 12/21/2020   Procedure: LEFT HEART CATH AND CORS/GRAFTS ANGIOGRAPHY;  Surgeon: Dunn, Mariaclara Spear M, MD;  Location: [place] CV [facility];  Service: Cardiovascular;  Laterality: N/A;   LEFT HEART CATHETERIZATION WITH CORONARY ANGIOGRAM N/A 08/09/2012   Procedure: LEFT HEART CATHETERIZATION WITH CORONARY ANGIOGRAM;  Surgeon: Jake Fettes Dunn  Martinique, MD;  Location: [facility] C[facility];  Service: Cardiovascular;  Laterality: N/A;   LUMBAR FUSION     NEPHRECTOMY Left early 2000s   REPLACEMENT TOTAL KNEE Right 08/2008   [name] 09/07/2010   SHOULDER ARTHROSCOPY WITH ROTATOR CUFF REPAIR Right 06/2005   [name] 09/20/2010   TOTAL KNEE ARTHROPLASTY  06/08/2011   Procedure: TOTAL KNEE ARTHROPLASTY;  Surgeon: Jake Lights, MD;  Location: [place];  Service: Orthopedics;  Laterality: Left;   TOTAL SHOULDER ARTHROPLASTY Right 04/06/2016   Procedure: RIGHT REVERSE TOTAL SHOULDER ARTHROPLASTY;  Surgeon: Jake Lights, MD;  Location: [place];  Service: Orthopedics;  Laterality: Right;   TOTAL SHOULDER REVISION Right 12/22/2016   Procedure: RIGHT SHOULDER GLENOID AND HUMERAL COMPONENT REVISION;  Surgeon: Jake Lights, MD;  Location: [place];  Service: Orthopedics;  Laterality: Right;   US ECHOCARDIOGRAPHY  12/15/2008   EF 60-65%   US ECHOCARDIOGRAPHY  09/28/2004   EF 55-60%     Current Outpatient Medications  Medication Sig Dispense Refill  acyclovir (ZOVIRAX) 400 MG tablet Take 400 mg by mouth 3 (three) times daily as needed (For cold sores). .  5   albuterol (VENTOLIN HFA) 108 (90 Base) MCG/ACT inhaler Inhale 2 puffs into the lungs every 6 (six) hours as needed for wheezing or shortness of breath.     allopurinol (ZYLOPRIM) 300 MG tablet Take 300 mg by mouth daily as needed (for gout flares).      aspirin EC 81 MG tablet Take 1 tablet (81 mg total) by mouth daily. 90 tablet 3   atorvastatin (LIPITOR) 80 MG tablet Take 1 tablet (80 mg total) by mouth every evening. 30 tablet 6   doxycycline (VIBRA-TABS) 100 MG tablet Take 1 tablet (100 mg total) by mouth 2 (two) times daily. 20 tablet 0   Fluticasone-Umeclidin-Vilant (TRELEGY ELLIPTA) 100-62.5-25 MCG/INH AEPB Inhale 1 puff into the lungs daily. 1 each 3   furosemide (LASIX) 20 MG tablet TAKE 1 TABLET(20 MG) BY MOUTH DAILY 30 tablet 5   HYDROcodone-acetaminophen (NORCO/VICODIN) 5-325 MG tablet Take 1  tablet by mouth 3 (three) times daily as needed for moderate pain.      LORazepam (ATIVAN) 0.5 MG tablet Take 0.5 mg by mouth 2 (two) times daily as needed for anxiety.   2   metoprolol tartrate (LOPRESSOR) 25 MG tablet Take 25 mg by mouth 2 (two) times daily.     pantoprazole (PROTONIX) 40 MG tablet Take 40 mg by mouth every evening.      No current facility-administered medications for this visit.    Allergies:   Latex and Tape    Social History:  The patient  reports that he quit smoking about 31 years ago. He has never used smokeless tobacco. He reports current alcohol use. He reports that he does not use drugs.   Family History:  The patient's family history includes Heart attack in his father; Heart disease in his brother.    ROS:  Please see the history of present illness.   Otherwise, review of systems are positive for none.   All other systems are reviewed and negative.    PHYSICAL EXAM: VS:  There were no vitals taken for this visit. , BMI There is no height or weight on file to calculate BMI. GEN: Well nourished, well developed, in no acute distress  HEENT: normal  Neck: no JVD, carotid bruits, or masses Cardiac: RRR; no murmurs, rubs, or gallops,no edema. Sternal incision is healing well.  Respiratory:  clear to auscultation bilaterally, normal work of breathing GI: soft, nontender, nondistended, + BS MS: no deformity or atrophy  Skin: warm and dry, no rash Neuro:  Strength and sensation are intact Psych: euthymic mood, full affect   EKG:  EKG is not ordered today.   Recent Labs: 12/20/2020: ALT 14; B Natriuretic Peptide 178.9; Hemoglobin 14.9; Platelets 273 12/24/2020: BUN 13; Creatinine, Ser 1.11; Potassium 4.5; Sodium 139    Lipid Panel    Component Value Date/Time   CHOL 120 11/21/2019 0953   TRIG 112 11/21/2019 0953   HDL 44 11/21/2019 0953   CHOLHDL 2.7 11/21/2019 0953   CHOLHDL 3.4 03/30/2019 0212   VLDL 35 03/30/2019 0212   LDLCALC 56 11/21/2019 0953     Dated 01/25/19: cholesterol 117, triglycerides 125, HDL 39, LDL 53. CMET, CBC, TSH normal Dated 04/09/20: cholesterol 123, triglycerides 130, HDL 44, LDL 34. Creatinine 1.17. otherwise CBC, CMET and TSH normal  Wt Readings from Last 3 Encounters:  01/18/21 231 lb (104.8 kg)  12/20/20 223  lb (101.2 kg)  09/14/20 236 lb (107 kg)      Other studies Reviewed: Additional studies/ records that were reviewed today include:   Cath 03/26/19: Procedures  LEFT HEART CATH AND CORONARY ANGIOGRAPHY  Conclusion    Prox RCA lesion is 45% stenosed. 2nd RPL lesion is 80% stenosed. Ost LM to Mid LM lesion is 50% stenosed. Ost Cx to Mid Cx lesion is 80% stenosed. Ramus lesion is 90% stenosed. Mid LAD lesion is 70% stenosed. 2nd Diag lesion is 60% stenosed. The left ventricular systolic function is normal. LV end diastolic pressure is normal. The left ventricular ejection fraction is 55-65% by visual estimate.   1. Three vessel obstructive, Calcific CAD 2. Normal LV function 3. Normal LVEDP   Plan: recommend referral to CT surgery for CABG. Will stop Plavix.     Echo 03/30/19: IMPRESSIONS      1. Left ventricular ejection fraction, by visual estimation, is 55 to 60%. The left ventricle has normal function. There is no left ventricular hypertrophy.  2. Global right ventricle has mildly reduced systolic function.The right ventricular size is mildly enlarged. No increase in right ventricular wall thickness.  3. Left atrial size was normal.  4. Right atrial size was mildly dilated.  5. The mitral valve is normal in structure. No evidence of mitral valve regurgitation.  6. The tricuspid valve is normal in structure. Tricuspid valve regurgitation is trivial.  7. The aortic valve is normal in structure. Aortic valve regurgitation is not visualized. No evidence of aortic valve sclerosis or stenosis.  8. The pulmonic valve was grossly normal. Pulmonic valve regurgitation is not visualized.  9.  Aortic dilatation noted. 10. There is mild dilatation of the ascending aorta measuring 38 mm. 11. Normal pulmonary artery systolic pressure. 12. The atrial septum is grossly normal.  Cardiac cath 12/21/20:  LEFT HEART CATH AND CORS/GRAFTS ANGIOGRAPHY   Conclusion      Ost Cx to Mid Cx lesion is 80% stenosed.   Prox RCA lesion is 45% stenosed.   Ramus lesion is 90% stenosed.   2nd RPL lesion is 80% stenosed.   Mid LAD lesion is 65% stenosed.   Ost LM to Mid LM lesion is 40% stenosed.   2nd Diag lesion is 90% stenosed.   LIMA graft was visualized by angiography and is normal in caliber.   SVG graft was visualized by angiography and is normal in caliber.   SVG graft was visualized by angiography and is normal in caliber.   The graft exhibits no disease.   The graft exhibits no disease.   The graft exhibits no disease.   LV end diastolic pressure is normal.   3 vessel obstructive CAD Patent LIMA - this is tied to the second diagonal and not the LAD Patent SVG to the ramus intermediate. No visualization of SVG to diagonal which per op note came off the hood of the SVG to ramus Patent SVG to the PL branch of the RCA Normal LVEDP   Plan: there appears to be good blood flow to all major vessels. The LAD was not revascularized but has borderline disease and the patient had prior Myoview with no ischemia in this territory. Other grafts are patent. Recommend continued medical therapy. Does not appear to be volume overloaded. Echo pending.  Given significant difficulty from the radial approach I would recommend femoral access for any future Cardiac cath procedures.  Coronary Diagrams  Diagnostic Dominance: Right Intervention  Echo 01/08/21: IMPRESSIONS  1. Left ventricular ejection fraction, by estimation, is 50 to 55%. The  left ventricle has low normal function. The left ventricle demonstrates  regional wall motion abnormalities (see scoring diagram/findings for  description).  There is moderate left  ventricular hypertrophy. Left ventricular diastolic parameters are  consistent with Grade I diastolic dysfunction (impaired relaxation). There  is moderate hypokinesis of the left ventricular, basal-mid septal wall.   2. Right ventricular systolic function is moderately reduced. The right  ventricular size is mildly enlarged. There is normal pulmonary artery  systolic pressure. The estimated right ventricular systolic pressure is  51.7 mmHg.   3. Left atrial size was mildly dilated.   4. The mitral valve is abnormal. Trivial mitral valve regurgitation.   5. The aortic valve is tricuspid. Aortic valve regurgitation is not  visualized. Mild to moderate aortic valve sclerosis/calcification is  present, without any evidence of aortic stenosis.   6. The inferior vena cava is normal in size with greater than 50%  respiratory variability, suggesting right atrial pressure of 3 mmHg.   Comparison(s): Changes from prior study are noted. 09/05/2019: LVEF 60-65%,  severe LAE, moderate RAE.    ASSESSMENT AND PLAN:  1. CAD  Now s/p CABG x 4 on 04/02/19: cardiac cath in August showed patent grafts and no new disease. Continue ASA,  high dose statin and metoprolol.    Atrial fibrillation post op CABG.  Maintaining NSR. Off Eliquis and amiodarone since April.   3. HTN: Blood pressure is good  control now.    4. HLD: Continue on Lipitor 80 mg daily. LDL at goal     5. History of CVA on ASA and Plavix pre op. Now on ASA  Only.      6. Cervical/lumbar spine disease. Followed by Dr. Ellene Route.      7. Chronic diastolic CHF. Volume status looks good on exam. On lasix 40 mg daily. Does not appear to have significant sleep apnea. Echo in September showed EF 50-55%.      Current medicines are reviewed at length with the patient today.  The patient does not have concerns regarding medicines.  The following changes have been made:  See above  Labs/ tests ordered today include:    No orders of the defined types were placed in this encounter.    Disposition:   FU 6 months if Myoview is OK  Signed, Jae Bruck Martinique, MD  04/12/2021 3:13 PM    [facility] [address], [place], [place], [address] Phone [phone], Fax [phone]

## 2021-04-16 ENCOUNTER — Ambulatory Visit: Payer: Medicare Other | Admitting: Cardiology

## 2021-06-07 ENCOUNTER — Observation Stay (HOSPITAL_COMMUNITY)
Admission: AD | Admit: 2021-06-07 | Discharge: 2021-06-11 | Disposition: A | Discharge status: Skilled Nursing Facility | Payer: Medicare Other | Source: Ambulatory Visit | Attending: Internal Medicine | Admitting: Internal Medicine

## 2021-06-07 ENCOUNTER — Encounter (HOSPITAL_COMMUNITY): Payer: Self-pay | Admitting: Internal Medicine

## 2021-06-07 ENCOUNTER — Other Ambulatory Visit: Payer: Self-pay

## 2021-06-07 ENCOUNTER — Observation Stay (HOSPITAL_COMMUNITY): Payer: Medicare Other

## 2021-06-07 DIAGNOSIS — M25462 Effusion, left knee: Secondary | ICD-10-CM | POA: Diagnosis not present

## 2021-06-07 DIAGNOSIS — Z01818 Encounter for other preprocedural examination: Secondary | ICD-10-CM

## 2021-06-07 DIAGNOSIS — E785 Hyperlipidemia, unspecified: Secondary | ICD-10-CM | POA: Diagnosis not present

## 2021-06-07 DIAGNOSIS — M238X2 Other internal derangements of left knee: Secondary | ICD-10-CM | POA: Insufficient documentation

## 2021-06-07 DIAGNOSIS — Z7982 Long term (current) use of aspirin: Secondary | ICD-10-CM | POA: Diagnosis not present

## 2021-06-07 DIAGNOSIS — Z951 Presence of aortocoronary bypass graft: Secondary | ICD-10-CM | POA: Insufficient documentation

## 2021-06-07 DIAGNOSIS — M25552 Pain in left hip: Secondary | ICD-10-CM | POA: Diagnosis not present

## 2021-06-07 DIAGNOSIS — Z96653 Presence of artificial knee joint, bilateral: Secondary | ICD-10-CM | POA: Insufficient documentation

## 2021-06-07 DIAGNOSIS — G8911 Acute pain due to trauma: Secondary | ICD-10-CM | POA: Diagnosis not present

## 2021-06-07 DIAGNOSIS — E669 Obesity, unspecified: Secondary | ICD-10-CM | POA: Insufficient documentation

## 2021-06-07 DIAGNOSIS — S83105A Unspecified dislocation of left knee, initial encounter: Secondary | ICD-10-CM | POA: Diagnosis present

## 2021-06-07 DIAGNOSIS — Z85528 Personal history of other malignant neoplasm of kidney: Secondary | ICD-10-CM | POA: Insufficient documentation

## 2021-06-07 DIAGNOSIS — Z79899 Other long term (current) drug therapy: Secondary | ICD-10-CM | POA: Insufficient documentation

## 2021-06-07 DIAGNOSIS — S83104A Unspecified dislocation of right knee, initial encounter: Secondary | ICD-10-CM

## 2021-06-07 DIAGNOSIS — I2510 Atherosclerotic heart disease of native coronary artery without angina pectoris: Secondary | ICD-10-CM | POA: Diagnosis not present

## 2021-06-07 DIAGNOSIS — S8992XA Unspecified injury of left lower leg, initial encounter: Secondary | ICD-10-CM | POA: Diagnosis present

## 2021-06-07 DIAGNOSIS — I10 Essential (primary) hypertension: Secondary | ICD-10-CM | POA: Diagnosis not present

## 2021-06-07 DIAGNOSIS — Z20822 Contact with and (suspected) exposure to covid-19: Secondary | ICD-10-CM | POA: Diagnosis not present

## 2021-06-07 DIAGNOSIS — Z87891 Personal history of nicotine dependence: Secondary | ICD-10-CM | POA: Diagnosis not present

## 2021-06-07 DIAGNOSIS — Z8673 Personal history of transient ischemic attack (TIA), and cerebral infarction without residual deficits: Secondary | ICD-10-CM | POA: Diagnosis not present

## 2021-06-07 DIAGNOSIS — Z9889 Other specified postprocedural states: Secondary | ICD-10-CM | POA: Diagnosis not present

## 2021-06-07 DIAGNOSIS — X509XXA Other and unspecified overexertion or strenuous movements or postures, initial encounter: Secondary | ICD-10-CM | POA: Diagnosis not present

## 2021-06-07 DIAGNOSIS — M25562 Pain in left knee: Secondary | ICD-10-CM | POA: Diagnosis not present

## 2021-06-07 DIAGNOSIS — Y92009 Unspecified place in unspecified non-institutional (private) residence as the place of occurrence of the external cause: Secondary | ICD-10-CM | POA: Diagnosis not present

## 2021-06-07 DIAGNOSIS — W19XXXA Unspecified fall, initial encounter: Secondary | ICD-10-CM | POA: Diagnosis not present

## 2021-06-07 DIAGNOSIS — M9712XA Periprosthetic fracture around internal prosthetic left knee joint, initial encounter: Principal | ICD-10-CM | POA: Insufficient documentation

## 2021-06-07 DIAGNOSIS — Z96641 Presence of right artificial hip joint: Secondary | ICD-10-CM | POA: Insufficient documentation

## 2021-06-07 DIAGNOSIS — R609 Edema, unspecified: Secondary | ICD-10-CM | POA: Diagnosis not present

## 2021-06-07 DIAGNOSIS — Z91040 Latex allergy status: Secondary | ICD-10-CM | POA: Insufficient documentation

## 2021-06-07 DIAGNOSIS — Z743 Need for continuous supervision: Secondary | ICD-10-CM | POA: Diagnosis not present

## 2021-06-07 LAB — CBC
HCT: 38.5 % — ABNORMAL LOW (ref 39.0–52.0)
Hemoglobin: 13 g/dL (ref 13.0–17.0)
MCH: 31.4 pg (ref 26.0–34.0)
MCHC: 33.8 g/dL (ref 30.0–36.0)
MCV: 93 fL (ref 80.0–100.0)
Platelets: 191 10*3/uL (ref 150–400)
RBC: 4.14 MIL/uL — ABNORMAL LOW (ref 4.22–5.81)
RDW: 13.4 % (ref 11.5–15.5)
WBC: 8.2 10*3/uL (ref 4.0–10.5)
nRBC: 0 % (ref 0.0–0.2)

## 2021-06-07 LAB — COMPREHENSIVE METABOLIC PANEL
ALT: 14 U/L (ref 0–44)
AST: 12 U/L — ABNORMAL LOW (ref 15–41)
Albumin: 3.9 g/dL (ref 3.5–5.0)
Alkaline Phosphatase: 80 U/L (ref 38–126)
Anion gap: 10 (ref 5–15)
BUN: 17 mg/dL (ref 8–23)
CO2: 23 mmol/L (ref 22–32)
Calcium: 8.7 mg/dL — ABNORMAL LOW (ref 8.9–10.3)
Chloride: 103 mmol/L (ref 98–111)
Creatinine, Ser: 1.1 mg/dL (ref 0.61–1.24)
GFR, Estimated: >60 mL/min (ref >60)
Glucose, Bld: 113 mg/dL — ABNORMAL HIGH (ref 70–99)
Potassium: 3.7 mmol/L (ref 3.5–5.1)
Sodium: 136 mmol/L (ref 135–145)
Total Bilirubin: 1.3 mg/dL — ABNORMAL HIGH (ref 0.3–1.2)
Total Protein: 7.3 g/dL (ref 6.5–8.1)

## 2021-06-07 LAB — RESP PANEL BY RT-PCR (FLU A&B, COVID) ARPGX2
Influenza A by PCR: NEGATIVE
Influenza B by PCR: NEGATIVE
SARS Coronavirus 2 by RT PCR: NEGATIVE

## 2021-06-07 LAB — MAGNESIUM: Magnesium: 2 mg/dL (ref 1.7–2.4)

## 2021-06-07 MED ORDER — ONDANSETRON HCL 4 MG PO TABS: 4.0000 mg | ORAL_TABLET | Freq: Four times a day (QID) | ORAL | Status: DC | PRN

## 2021-06-07 MED ORDER — PANTOPRAZOLE SODIUM 40 MG PO TBEC
40.0000 mg | DELAYED_RELEASE_TABLET | Freq: Every evening | ORAL | Status: DC
  Administered 2021-06-07 – 2021-06-10 (×4): 40 mg via ORAL
  Filled 2021-06-07 (×4): qty 1

## 2021-06-07 MED ORDER — LORAZEPAM 2 MG/ML IJ SOLN
0.5000 mg | Freq: Once | INTRAMUSCULAR | Status: AC | PRN
  Administered 2021-06-07: 0.5 mg via INTRAVENOUS
  Filled 2021-06-07: qty 1

## 2021-06-07 MED ORDER — ACETAMINOPHEN 325 MG PO TABS
650.0000 mg | ORAL_TABLET | Freq: Four times a day (QID) | ORAL | Status: DC | PRN
  Administered 2021-06-07: 650 mg via ORAL
  Filled 2021-06-07: qty 2

## 2021-06-07 MED ORDER — OXYCODONE HCL 5 MG PO TABS
5.0000 mg | ORAL_TABLET | ORAL | Status: DC | PRN
  Administered 2021-06-07 – 2021-06-11 (×10): 5 mg via ORAL
  Filled 2021-06-07 (×10): qty 1

## 2021-06-07 MED ORDER — ATORVASTATIN CALCIUM 40 MG PO TABS
80.0000 mg | ORAL_TABLET | Freq: Every evening | ORAL | Status: DC
  Administered 2021-06-07 – 2021-06-10 (×4): 80 mg via ORAL
  Filled 2021-06-07 (×4): qty 2

## 2021-06-07 MED ORDER — MORPHINE SULFATE (PF) 4 MG/ML IV SOLN
4.0000 mg | Freq: Once | INTRAVENOUS | Status: AC
  Administered 2021-06-07: 4 mg via INTRAVENOUS
  Filled 2021-06-07: qty 1

## 2021-06-07 MED ORDER — METOPROLOL TARTRATE 25 MG PO TABS
25.0000 mg | ORAL_TABLET | Freq: Two times a day (BID) | ORAL | Status: DC
  Administered 2021-06-07 – 2021-06-11 (×8): 25 mg via ORAL
  Filled 2021-06-07 (×8): qty 1

## 2021-06-07 MED ORDER — ONDANSETRON HCL 4 MG/2ML IJ SOLN
4.0000 mg | Freq: Once | INTRAMUSCULAR | Status: AC
Start: 2021-06-07 — End: 2021-06-07
  Administered 2021-06-07: 4 mg via INTRAVENOUS
  Filled 2021-06-07: qty 2

## 2021-06-07 MED ORDER — LORAZEPAM 0.5 MG PO TABS
0.5000 mg | ORAL_TABLET | Freq: Two times a day (BID) | ORAL | Status: DC | PRN
  Administered 2021-06-07: 0.5 mg via ORAL
  Filled 2021-06-07: qty 1

## 2021-06-07 MED ORDER — ASPIRIN EC 81 MG PO TBEC
81.0000 mg | DELAYED_RELEASE_TABLET | Freq: Every day | ORAL | Status: DC
  Administered 2021-06-08: 81 mg via ORAL
  Filled 2021-06-07: qty 1

## 2021-06-07 MED ORDER — FUROSEMIDE 20 MG PO TABS
20.0000 mg | ORAL_TABLET | Freq: Every day | ORAL | Status: DC
  Administered 2021-06-08 – 2021-06-11 (×4): 20 mg via ORAL
  Filled 2021-06-07 (×4): qty 1

## 2021-06-07 MED ORDER — ACETAMINOPHEN 650 MG RE SUPP: 650.0000 mg | Freq: Four times a day (QID) | RECTAL | Status: DC | PRN

## 2021-06-07 MED ORDER — KETOROLAC TROMETHAMINE 15 MG/ML IJ SOLN
15.0000 mg | Freq: Once | INTRAMUSCULAR | Status: AC
  Administered 2021-06-07: 15 mg via INTRAVENOUS
  Filled 2021-06-07: qty 1

## 2021-06-07 MED ORDER — ONDANSETRON HCL 4 MG/2ML IJ SOLN: 4.0000 mg | Freq: Four times a day (QID) | INTRAMUSCULAR | Status: DC | PRN

## 2021-06-07 NOTE — H&P (Signed)
History and Physical    Jake Dunn BOF:[id number] DOB: 10-Feb-1941 DOA: 06/07/2021  PCP: Harlan Stains, MD   Patient coming from: Home.   Referred from orthopedic surgery [facility]) for direct admission  I have personally briefly reviewed patient's old medical records in [place]  Chief Complaint: Left knee injury.  HPI: Jake Dunn is a 81 y.o. male with medical history significant of adenomatous polyps, anxiety, arthritis, CAD s/p CABG, chronic lower back pain, GERD, gout hyperlipidemia, hypertension, osteoarthritis, renal cell carcinoma with history of left nephrectomy, history of other nonhemorrhagic stroke who is coming to the hospital referred from the orthopedic surgery office due to left knee pain that happened yesterday after he felt "a pop" in his left knee developing immediate pain.  He has been unable to bear weight on his LLE since yesterday.  He took hydrocodone without significant relief.  He went to see orthopedic surgery today who referred him to the hospital for pain management, evaluation with MRI and CT scan of the knee.  He denied fever, chills, sore throat, rhinorrhea, wheezing or hemoptysis, PND, orthopnea or recent pitting edema of the lower extremities.  He has had occasional precordial, nonradiating chest pain subsides on its own after a few minutes associated with dyspnea.  He is not sure if the pain is coming from his shoulder.  No diaphoresis, nausea, emesis or palpitations.  His last episode was over a week ago.  Denied abdominal pain, diarrhea, constipation, melena hematochezia.  No dysuria, frequency or hematuria.  No polyuria, polydipsia, polyphagia or blurred vision.  Review of Systems: As per HPI otherwise all other systems reviewed and are negative.  Past Medical History:  Diagnosis Date   Adenomatous polyp    Anxiety    Arthritis    "knees, ankles, shoulders" (08/15/2016)   CAD (coronary artery disease)    s/p cath in 2014 showing  significant stenosis in the diagonal side branches and in the RV marginal branch. These vessels were small and diffusely diseased. He is managed medically. 08/16/16 Cath, no disease progression   Chronic lower back pain    Disc disease, degenerative, cervical    GERD (gastroesophageal reflux disease)    Gout    Hyperlipidemia    Hypertension    Osteoarthritis    Renal cell carcinoma    left kidney removal   Stroke [facility])    MINI STROKE 15+ YRS AGO, no residual   Stroke ([place])    "I've had 2 or 3"; denies residual on 08/15/2016   Syncope and collapse     Past Surgical History:  Procedure Laterality Date   ANTERIOR CERVICAL DECOMP/DISCECTOMY FUSION  02/2004; 04/2005   [name] 09/20/2010; [name] 7/78/2423   APPLICATION OF ROBOTIC ASSISTANCE FOR SPINAL PROCEDURE N/A 01/02/2018   Procedure: APPLICATION OF ROBOTIC ASSISTANCE FOR SPINAL PROCEDURE;  Surgeon: Kristeen Miss, MD;  Location: [place];  Service: Neurosurgery;  Laterality: N/A;   BACK SURGERY     CARDIAC CATHETERIZATION  12/14/2008   ef 50-55%. SHOWED SIGNIFICANT STENOSIS AND TO DIAGONAL SIDE BRANCHES AND IN THE RIGHT VENTRICULAR MARGINAL BRANCH. THESE VESSELS WERE SMALL AND DIFFUSELY DISEASED   CARDIOVASCULAR STRESS TEST  12/10/2009   EF 61%. EKG negative. Mild peri ischemia. Managed medically.    CARPAL TUNNEL RELEASE Right 11/2005   [name] 09/20/2010   CARPAL TUNNEL RELEASE Left 06/2007   [name] 5/132012   COLONOSCOPY  11/2004   [name] 09/20/2010   COLONOSCOPY W/ BIOPSIES AND POLYPECTOMY  09/2002   [name]  09/20/2010   CORONARY ARTERY BYPASS GRAFT N/A 04/02/2019   Procedure: CORONARY ARTERY BYPASS GRAFTING (CABG) using LIMA to LAD; Endoscopically harvested right greater saphenous vein to the PLB, Ramus, and Diag 2.;  Surgeon: Lajuana Matte, MD;  Location: [place];  Service: Open Heart Surgery;  Laterality: N/A;   INCISION AND DRAINAGE ABSCESS Right 10/18/2016   Procedure: INCISION AND DRAINAGE ABSCESS RIGHT SHOULDER;  Surgeon: [name], MD;  Location: [place];  Service: Orthopedics;  Laterality: Right;   INCISION AND DRAINAGE OF WOUND Right 08/2005   shoulder/notes 09/20/2010   IR FLUORO GUIDE CV LINE LEFT  10/19/2016   IR US GUIDE VASC ACCESS LEFT  10/19/2016   IRRIGATION AND DEBRIDEMENT SHOULDER Right 12/22/2016   Procedure: IRRIGATION AND DEBRIDEMENT RIGHT SHOULDER;  Surgeon: [name], MD;  Location: [place];  Service: Orthopedics;  Laterality: Right;   JOINT REPLACEMENT     LEFT HEART CATH AND CORONARY ANGIOGRAPHY N/A 08/16/2016   Procedure: Left Heart Cath and Coronary Angiography;  Surgeon: [name], MD;  Location: [place] CV [facility];  Service: Cardiovascular;  Laterality: N/A;   LEFT HEART CATH AND CORONARY ANGIOGRAPHY N/A 03/26/2019   Procedure: LEFT HEART CATH AND CORONARY ANGIOGRAPHY;  Surgeon: Martinique, Peter M, MD;  Location: [place] [facility];  Service: Cardiovascular;  Laterality: N/A;   LEFT HEART CATH AND CORS/GRAFTS ANGIOGRAPHY N/A 12/21/2020   Procedure: LEFT HEART CATH AND CORS/GRAFTS ANGIOGRAPHY;  Surgeon: Martinique, Peter M, MD;  Location: [place] [facility];  Service: Cardiovascular;  Laterality: N/A;   LEFT HEART CATHETERIZATION WITH CORONARY ANGIOGRAM N/A 08/09/2012   Procedure: LEFT HEART CATHETERIZATION WITH CORONARY ANGIOGRAM;  Surgeon: Peter M Martinique, MD;  Location: [facility] C[facility];  Service: Cardiovascular;  Laterality: N/A;   LUMBAR FUSION     NEPHRECTOMY Left early 2000s   REPLACEMENT TOTAL KNEE Right 08/2008   [name] 09/07/2010   SHOULDER ARTHROSCOPY WITH ROTATOR CUFF REPAIR Right 06/2005   [name] 09/20/2010   TOTAL KNEE ARTHROPLASTY  06/08/2011   Procedure: TOTAL KNEE ARTHROPLASTY;  Surgeon: [name], MD;  Location: [place];  Service: Orthopedics;  Laterality: Left;   TOTAL SHOULDER ARTHROPLASTY Right 04/06/2016   Procedure: RIGHT REVERSE TOTAL SHOULDER ARTHROPLASTY;  Surgeon: [name], MD;  Location: [place];  Service: Orthopedics;  Laterality: Right;   TOTAL  SHOULDER REVISION Right 12/22/2016   Procedure: RIGHT SHOULDER GLENOID AND HUMERAL COMPONENT REVISION;  Surgeon: [name], MD;  Location: [place];  Service: Orthopedics;  Laterality: Right;   US ECHOCARDIOGRAPHY  12/15/2008   EF 60-65%   US ECHOCARDIOGRAPHY  09/28/2004   EF 55-60%   Social History  reports that he quit smoking about 32 years ago. He has never used smokeless tobacco. He reports current alcohol use. He reports that he does not use drugs.  Allergies  Allergen Reactions   Latex Rash    Other reaction(s): Hives/Skin Rash/Itching   Tape Rash    Paper tape okay Other reaction(s): Hives/Rash/Itching    Family History  Problem Relation Age of Onset   Heart attack Father    Heart disease Brother    Allergic rhinitis Neg Hx    Angioedema Neg Hx    Asthma Neg Hx    Atopy Neg Hx    Eczema Neg Hx    Immunodeficiency Neg Hx    Urticaria Neg Hx    Prior to Admission medications   Medication Sig Start Date End Date Taking? Authorizing  Provider  acyclovir (ZOVIRAX) 400 MG tablet Take 400 mg by mouth 3 (three) times daily as needed (For cold sores). . 10/05/16   [provider]  albuterol (VENTOLIN HFA) 108 (90 Base) MCG/ACT inhaler Inhale 2 puffs into the lungs every 6 (six) hours as needed for wheezing or shortness of breath. 08/06/20   [provider]  allopurinol (ZYLOPRIM) 300 MG tablet Take 300 mg by mouth daily as needed (for gout flares).     [provider]  aspirin EC 81 MG tablet Take 1 tablet (81 mg total) by mouth daily. 05/28/19   Martinique, Peter M, MD  atorvastatin (LIPITOR) 80 MG tablet Take 1 tablet (80 mg total) by mouth every evening. 08/17/16   Cheryln Manly, NP  doxycycline (VIBRA-TABS) 100 MG tablet Take 1 tablet (100 mg total) by mouth 2 (two) times daily. 01/18/21   Olalere, Ernesto Rutherford, MD  Fluticasone-Umeclidin-Vilant (TRELEGY ELLIPTA) 100-62.5-25 MCG/INH AEPB Inhale 1 puff into the lungs daily. 09/14/20   Laurin Coder, MD   furosemide (LASIX) 20 MG tablet TAKE 1 TABLET(20 MG) BY MOUTH DAILY 01/07/21   Martinique, Peter M, MD  HYDROcodone-acetaminophen (NORCO/VICODIN) 5-325 MG tablet Take 1 tablet by mouth 3 (three) times daily as needed for moderate pain.  02/11/20   [provider]  LORazepam (ATIVAN) 0.5 MG tablet Take 0.5 mg by mouth 2 (two) times daily as needed for anxiety.  08/07/15   [provider]  metoprolol tartrate (LOPRESSOR) 25 MG tablet Take 25 mg by mouth 2 (two) times daily.    [provider]  pantoprazole (PROTONIX) 40 MG tablet Take 40 mg by mouth every evening.     [provider]    Physical Exam: Vitals:   06/07/21 1502  BP: (!) 151/80  Pulse: 65  Resp: 18  Temp: 98.4 F (36.9 C)  TempSrc: Oral  SpO2: 97%    Constitutional: NAD, calm, comfortable Eyes: PERRL, lids and conjunctivae normal ENMT: Mucous membranes are moist. Posterior pharynx clear of any exudate or lesions. Neck: normal, supple, no masses, no thyromegaly Respiratory: clear to auscultation bilaterally, no wheezing, no crackles. Normal respiratory effort. No accessory muscle use.  Cardiovascular: Regular rate and rhythm, no murmurs / rubs / gallops. No extremity edema. 2+ pedal pulses. No carotid bruits.  Abdomen: Obese, no distention.  Soft, no tenderness, no masses palpated. No hepatosplenomegaly. Bowel sounds positive.  Musculoskeletal: no clubbing / cyanosis.  LLE knee immobilizer in place.  Left knee severely decreased ROM, no contractures. Normal muscle tone.  Skin: no rashes, lesions, ulcers. No induration Neurologic: CN 2-12 grossly intact. Sensation intact, DTR normal. Strength 5/5 in all 4.  Psychiatric: Normal judgment and insight. Alert and oriented x 3. Normal mood.   Labs on Admission: I have personally reviewed following labs and imaging studies  CBC: No results for input(s): WBC, NEUTROABS, HGB, HCT, MCV, PLT in the last 168 hours.  Basic Metabolic Panel: No results for  input(s): NA, K, CL, CO2, GLUCOSE, BUN, CREATININE, CALCIUM, MG, PHOS in the last 168 hours.  GFR: CrCl cannot be calculated (Patient's most recent lab result is older than the maximum 21 days allowed.).  Liver Function Tests: No results for input(s): AST, ALT, ALKPHOS, BILITOT, PROT, ALBUMIN in the last 168 hours.  Radiological Exams on Admission: No results found.  EKG: Independently reviewed.   Assessment/Plan Principal Problem:   Acute traumatic internal derangement of right knee Observation/telemetry. Analgesics as needed.   Antiemetics as needed. Keep left knee  immobilized. Check MRI of the left knee. Check CT of the left knee. Orthopedic surgery will be following.  Active Problems:   Hypertension Continue metoprolol 25 mg p.o. twice daily. Monitor blood pressure and heart rate.    CAD (coronary artery disease) Continue aspirin and metoprolol.    Hyperlipidemia No longer on atorvastatin. Follow-up with PCP and cardiology.    Class 1 obesity Needs lifestyle modifications. Follow-up with PCP.    DVT prophylaxis: SCDs. Code Status:   Full code. Family Communication:  His wife was in his room. Disposition Plan:   Patient is from:  Home.  Anticipated DC to:  Home.  Anticipated DC date:  06/08/2021.  Anticipated DC barriers: Clinical status/pending imaging.  Consults called:  [facility] orthopedics. Admission status:  Observation/telemetry.  Severity of Illness: High severity in the setting of left knee trauma with inability to bear weight on LLE.  Reubin Milan MD [facility]  How to contact the [facility] Attending or Consulting provider [place] or covering provider during after hours [place], for this patient?   Check the care team in [facility] and look for a) attending/consulting [facility] provider listed and b) the [facility] team listed Log into [email or web address] and use [place]'s universal password to access. If you do not have the password, please contact the  hospital operator. Locate the [facility] provider you are looking for under [facility] and page to a number that you can be directly reached. If you still have difficulty reaching the provider, please page the [facility] (Director on Call) for the Hospitalists listed on amion for assistance.  06/07/2021, 4:47 PM   This document was prepared using Dragon voice recognition software and may contain some unintended transcription errors.

## 2021-06-07 NOTE — Progress Notes (Addendum)
° ° °  Patient is a direct admit from our office. He was brought in by EMS after an incident on the stairs yesterday at home when he felt sudden pain and weakness in the LLE. He was able to control himself as he fell down. Unable to bear weight since. Extreme pain with any movement. History of a Left TKA in 2010 by Dr. Amada Jupiter.   2v knee and 2v pelvis xrays done in the office show no evidence of obvious fracture or dislocation. Stable left TKA components.   Concern for possible soft tissue injury to quadriceps muscle, knee ligaments, or menisci.   Will order Left knee MRI following MARS protocol due to the TKA and Left knee CT. Determine further treatment based on those results.      Britt Bottom, PA-C Office [phone] 06/07/2021, 4:28 PM

## 2021-06-08 ENCOUNTER — Observation Stay (HOSPITAL_COMMUNITY): Payer: Medicare Other

## 2021-06-08 DIAGNOSIS — I10 Essential (primary) hypertension: Secondary | ICD-10-CM | POA: Diagnosis not present

## 2021-06-08 DIAGNOSIS — Z96641 Presence of right artificial hip joint: Secondary | ICD-10-CM | POA: Diagnosis not present

## 2021-06-08 DIAGNOSIS — Z79899 Other long term (current) drug therapy: Secondary | ICD-10-CM | POA: Diagnosis not present

## 2021-06-08 DIAGNOSIS — Z91040 Latex allergy status: Secondary | ICD-10-CM | POA: Diagnosis not present

## 2021-06-08 DIAGNOSIS — E669 Obesity, unspecified: Secondary | ICD-10-CM | POA: Diagnosis not present

## 2021-06-08 DIAGNOSIS — I2510 Atherosclerotic heart disease of native coronary artery without angina pectoris: Secondary | ICD-10-CM | POA: Diagnosis not present

## 2021-06-08 DIAGNOSIS — M238X2 Other internal derangements of left knee: Secondary | ICD-10-CM | POA: Diagnosis not present

## 2021-06-08 DIAGNOSIS — Z951 Presence of aortocoronary bypass graft: Secondary | ICD-10-CM | POA: Diagnosis not present

## 2021-06-08 DIAGNOSIS — Z85528 Personal history of other malignant neoplasm of kidney: Secondary | ICD-10-CM | POA: Diagnosis not present

## 2021-06-08 DIAGNOSIS — Z87891 Personal history of nicotine dependence: Secondary | ICD-10-CM | POA: Diagnosis not present

## 2021-06-08 DIAGNOSIS — Z01818 Encounter for other preprocedural examination: Secondary | ICD-10-CM | POA: Diagnosis not present

## 2021-06-08 DIAGNOSIS — Z7982 Long term (current) use of aspirin: Secondary | ICD-10-CM | POA: Diagnosis not present

## 2021-06-08 DIAGNOSIS — E785 Hyperlipidemia, unspecified: Secondary | ICD-10-CM | POA: Diagnosis not present

## 2021-06-08 DIAGNOSIS — Z8673 Personal history of transient ischemic attack (TIA), and cerebral infarction without residual deficits: Secondary | ICD-10-CM | POA: Diagnosis not present

## 2021-06-08 DIAGNOSIS — M9712XA Periprosthetic fracture around internal prosthetic left knee joint, initial encounter: Secondary | ICD-10-CM | POA: Diagnosis not present

## 2021-06-08 DIAGNOSIS — Z96653 Presence of artificial knee joint, bilateral: Secondary | ICD-10-CM | POA: Diagnosis not present

## 2021-06-08 DIAGNOSIS — Z20822 Contact with and (suspected) exposure to covid-19: Secondary | ICD-10-CM | POA: Diagnosis not present

## 2021-06-08 MED ORDER — DOCUSATE SODIUM 100 MG PO CAPS: 100.0000 mg | ORAL_CAPSULE | Freq: Two times a day (BID) | ORAL | Status: DC | PRN

## 2021-06-08 MED ORDER — METHOCARBAMOL 500 MG PO TABS
500.0000 mg | ORAL_TABLET | Freq: Three times a day (TID) | ORAL | Status: DC | PRN
  Administered 2021-06-09 – 2021-06-11 (×6): 500 mg via ORAL
  Filled 2021-06-08 (×6): qty 1

## 2021-06-08 MED ORDER — ASPIRIN EC 81 MG PO TBEC
81.0000 mg | DELAYED_RELEASE_TABLET | Freq: Two times a day (BID) | ORAL | Status: DC
  Administered 2021-06-08 – 2021-06-11 (×6): 81 mg via ORAL
  Filled 2021-06-08 (×6): qty 1

## 2021-06-08 MED ORDER — METHOCARBAMOL 1000 MG/10ML IJ SOLN
500.0000 mg | Freq: Once | INTRAVENOUS | Status: AC
  Administered 2021-06-08: 500 mg via INTRAVENOUS
  Filled 2021-06-08: qty 500
  Filled 2021-06-08: qty 5

## 2021-06-08 MED ORDER — TRAMADOL HCL 50 MG PO TABS
50.0000 mg | ORAL_TABLET | Freq: Four times a day (QID) | ORAL | Status: DC
  Administered 2021-06-08 (×2): 100 mg via ORAL
  Administered 2021-06-09 (×3): 50 mg via ORAL
  Administered 2021-06-09: 100 mg via ORAL
  Administered 2021-06-10: 50 mg via ORAL
  Administered 2021-06-10: 100 mg via ORAL
  Administered 2021-06-10 (×2): 50 mg via ORAL
  Administered 2021-06-11 (×3): 100 mg via ORAL
  Filled 2021-06-08 (×3): qty 2
  Filled 2021-06-08: qty 1
  Filled 2021-06-08 (×4): qty 2
  Filled 2021-06-08 (×4): qty 1
  Filled 2021-06-08: qty 2

## 2021-06-08 MED ORDER — ACETAMINOPHEN 500 MG PO TABS
1000.0000 mg | ORAL_TABLET | Freq: Three times a day (TID) | ORAL | Status: DC
  Administered 2021-06-08 – 2021-06-11 (×9): 1000 mg via ORAL
  Filled 2021-06-08 (×9): qty 2

## 2021-06-08 NOTE — Progress Notes (Signed)
PROGRESS NOTE    Jake Dunn  GYI:[id number] DOB: [birth year] DOA: 06/07/2021 PCP: Harlan Stains, MD    Brief Narrative:   81 y.o. male with medical history significant of adenomatous polyps, anxiety, arthritis, CAD s/p CABG, chronic lower back pain, GERD, gout hyperlipidemia, hypertension, osteoarthritis, renal cell carcinoma with history of left nephrectomy, history of other nonhemorrhagic stroke who is coming to the hospital referred from the orthopedic surgery office due to left knee pain that happened yesterday after he felt "a pop" in his left knee developing immediate pain  MRI findings of nondisplaced fracture near prosthetic hardware. Orthopedic Surgery following  Assessment & Plan:   Principal Problem:   Acute traumatic internal derangement of left knee Active Problems:   Hypertension   CAD (coronary artery disease)   Hyperlipidemia   Class 1 obesity  Principal Problem:   Acute traumatic internal derangement of right knee -Cont with anti-emetics as needed -MRI knee reviewed, findings of nondisplaced fracture near prior knee hardware -Seen by Orthopedic Surgery who does not plan for surgery. Recommendations for PT -Pt reported feeling uncomfortable going home b/c of difficulty caring for his wife in his current condition -PT/OT consulted. TOC consulted   Active Problems:   Hypertension Continue metoprolol 25 mg p.o. twice daily. BP stable at this time     CAD (coronary artery disease) Continue aspirin and metoprolol.     Hyperlipidemia No longer on atorvastatin. Follow-up with PCP and cardiology.     Class 1 obesity Needs lifestyle modifications. Follow-up with PCP.   DVT prophylaxis: SCD's Code Status: Full Family Communication: Pt in room, family at bedside  Status is: Observation The patient remains OBS appropriate and will d/c before 2 midnights.  Planned Discharge Destination: Barriers to discharge: PT/OT eval for possible dispo  planning        Consultants:  Orthopedic Surgery  Procedures:    Antimicrobials: Anti-infectives (From admission, onward)    None       Subjective: Complains of L knee pain with movement  Objective: Vitals:   06/08/21 0400 06/08/21 0906 06/08/21 1145 06/08/21 1242  BP: 138/82 (!) 148/71 127/81 (!) 152/71  Pulse: (!) 59 68 64 66  Resp: 18 18 17 18   Temp: 98 F (36.7 C) 98.5 F (36.9 C) 98.3 F (36.8 C) 98.1 F (36.7 C)  TempSrc: Oral Oral Oral Oral  SpO2: 96% 94% 95% 96%  Weight:      Height:        Intake/Output Summary (Last 24 hours) at 06/08/2021 1419 Last data filed at 06/08/2021 1358 Gross per 24 hour  Intake 840 ml  Output 800 ml  Net 40 ml   Filed Weights   06/07/21 1848  Weight: 106.4 kg    Examination: General exam: Awake, laying in bed, in nad Respiratory system: Normal respiratory effort, no wheezing Cardiovascular system: regular rate, s1, s2 Gastrointestinal system: Soft, nondistended, positive BS Central nervous system: CN2-12 grossly intact, strength intact Extremities: Perfused, no clubbing Skin: Normal skin turgor, no notable skin lesions seen Psychiatry: Mood normal // no visual hallucinations   Data Reviewed: I have personally reviewed following labs and imaging studies  CBC: Recent Labs  Lab 06/07/21 1701  WBC 8.2  HGB 13.0  HCT 38.5*  MCV 93.0  PLT 350   Basic Metabolic Panel: Recent Labs  Lab 06/07/21 1701  NA 136  K 3.7  CL 103  CO2 23  GLUCOSE 113*  BUN 17  CREATININE 1.10  CALCIUM 8.7*  MG 2.0  GFR: Estimated Creatinine Clearance: 63.3 mL/min (by C-G formula based on SCr of 1.1 mg/dL). Liver Function Tests: Recent Labs  Lab 06/07/21 1701  AST 12*  ALT 14  ALKPHOS 80  BILITOT 1.3*  PROT 7.3  ALBUMIN 3.9   No results for input(s): LIPASE, AMYLASE in the last 168 hours. No results for input(s): AMMONIA in the last 168 hours. Coagulation Profile: No results for input(s): INR, PROTIME in the  last 168 hours. Cardiac Enzymes: No results for input(s): CKTOTAL, CKMB, CKMBINDEX, TROPONINI in the last 168 hours. BNP (last 3 results) No results for input(s): PROBNP in the last 8760 hours. HbA1C: No results for input(s): HGBA1C in the last 72 hours. CBG: No results for input(s): GLUCAP in the last 168 hours. Lipid Profile: No results for input(s): CHOL, HDL, LDLCALC, TRIG, CHOLHDL, LDLDIRECT in the last 72 hours. Thyroid Function Tests: No results for input(s): TSH, T4TOTAL, FREET4, T3FREE, THYROIDAB in the last 72 hours. Anemia Panel: No results for input(s): VITAMINB12, FOLATE, FERRITIN, TIBC, IRON, RETICCTPCT in the last 72 hours. Sepsis Labs: No results for input(s): PROCALCITON, LATICACIDVEN in the last 168 hours.  Recent Results (from the past 240 hour(s))  Resp Panel by RT-PCR (Flu A&B, Covid) Nasopharyngeal Swab     Status: None   Collection Time: 06/07/21  6:15 PM   Specimen: Nasopharyngeal Swab; Nasopharyngeal(NP) swabs in vial transport medium  Result Value Ref Range Status   SARS Coronavirus 2 by RT PCR NEGATIVE NEGATIVE Final    Comment: (NOTE) SARS-CoV-2 target nucleic acids are NOT DETECTED.  The SARS-CoV-2 RNA is generally detectable in upper respiratory specimens during the acute phase of infection. The lowest concentration of SARS-CoV-2 viral copies this assay can detect is 138 copies/mL. A negative result does not preclude SARS-Cov-2 infection and should not be used as the sole basis for treatment or other patient management decisions. A negative result may occur with  improper specimen collection/handling, submission of specimen other than nasopharyngeal swab, presence of viral mutation(s) within the areas targeted by this assay, and inadequate number of viral copies(<138 copies/mL). A negative result must be combined with clinical observations, patient history, and epidemiological information. The expected result is Negative.  Fact Sheet for Patients:   EntrepreneurPulse.com.au  Fact Sheet for Healthcare Providers:  IncredibleEmployment.be  This test is no t yet approved or cleared by the [place] FDA and  has been authorized for detection and/or diagnosis of SARS-CoV-2 by FDA under an Emergency Use Authorization (EUA). This EUA will remain  in effect (meaning this test can be used) for the duration of the COVID-19 declaration under Section 564(b)(1) of the Act, 21 U.S.C.section 360bbb-3(b)(1), unless the authorization is terminated  or revoked sooner.       Influenza A by PCR NEGATIVE NEGATIVE Final   Influenza B by PCR NEGATIVE NEGATIVE Final    Comment: (NOTE) The Xpert Xpress SARS-CoV-2/FLU/RSV plus assay is intended as an aid in the diagnosis of influenza from Nasopharyngeal swab specimens and should not be used as a sole basis for treatment. Nasal washings and aspirates are unacceptable for Xpert Xpress SARS-CoV-2/FLU/RSV testing.  Fact Sheet for Patients: EntrepreneurPulse.com.au  Fact Sheet for Healthcare Providers: IncredibleEmployment.be  This test is not yet approved or cleared by the [place] FDA and has been authorized for detection and/or diagnosis of SARS-CoV-2 by FDA under an Emergency Use Authorization (EUA). This EUA will remain in effect (meaning this test can be used) for the duration of the COVID-19 declaration under Section 564(b)(1) of  the Act, 21 U.S.C. section 360bbb-3(b)(1), unless the authorization is terminated or revoked.  Performed at [facility], [facility] [address], [place], [place] [address]      Radiology Studies: CT KNEE LEFT WO CONTRAST  Result Date: 06/07/2021 CLINICAL DATA:  Knee trauma, internal derangement suspected. History of prior surgery. EXAM: CT OF THE left KNEE WITHOUT CONTRAST TECHNIQUE: Multidetector CT imaging of the left knee was performed according to the standard protocol.  Multiplanar CT image reconstructions were also generated. RADIATION DOSE REDUCTION: This exam was performed according to the departmental dose-optimization program which includes automated exposure control, adjustment of the mA and/or kV according to patient size and/or use of iterative reconstruction technique. COMPARISON:  06/08/2011. FINDINGS: Bones/Joint/Cartilage Total knee arthroplasty changes are noted. No evidence of acute fracture or dislocation. No evidence of hardware loosening. Examination is limited due to hardware artifact. Ligaments Suboptimally assessed by CT. Muscles and Tendons Mild intramuscular fat stranding is noted in the posterior compartment of the distal thigh. There is suboptimal visualization of the tendons due to hardware artifact at the knee. Soft tissues Extensive vascular calcifications are noted. There is a small suprapatellar joint effusion. IMPRESSION: 1. No acute fracture or dislocation. 2. Status post total knee arthroplasty with no evidence of hardware loosening. Evaluation is somewhat limited due to hardware artifact. 3. Small suprapatellar joint effusion. 4. Mild intramuscular fat stranding in the posterior compartment of the distal thigh, which may be infectious or inflammatory. Electronically Signed   By: Brett Fairy M.D.   On: 06/07/2021 21:30   MR KNEE LEFT WO CONTRAST  Result Date: 06/08/2021 CLINICAL DATA:  Left knee pain. Weakness. EXAM: MRI OF THE LEFT KNEE WITHOUT CONTRAST TECHNIQUE: Multiplanar, multisequence MR imaging of the knee was performed. No intravenous contrast was administered. COMPARISON:  None. FINDINGS: 06/08/2011 CT knee Bones/Joint/Cartilage Left total knee arthroplasty with susceptibility artifact partially obscuring the adjacent soft tissue and osseous structures. Periprosthetic linear signal abnormality in the distal medial metaphysis adjacent to the femoral stem component with extension into the distal femoral diametaphysis most consistent  with a nondisplaced fracture. Normal alignment. Small joint effusion. No other marrow signal abnormality. Ligaments Collateral ligaments are intact. Muscles and Tendons Quadriceps tendon and patellar tendon are intact. Muscles are normal. Soft tissue No fluid collection or hematoma. No soft tissue mass. Nonspecific fat stranding in the subcutaneous fat along the posterior aspect of the distal thigh likely related to trauma. IMPRESSION: Nondisplaced periprosthetic fracture of the distal femoral metaphysis adjacent to the femoral stem component with extension into the distal femoral diametaphysis. Electronically Signed   By: Kathreen Devoid M.D.   On: 06/08/2021 07:20   DG CHEST PORT 1 VIEW  Result Date: 06/08/2021 CLINICAL DATA:  Pre-op respiratory examination. History of hypertension, coronary artery disease and reflux. EXAM: PORTABLE CHEST 1 VIEW COMPARISON:  Radiographs 12/20/2020 and 10/12/2020.  CT 02/12/2010. FINDINGS: 0751 hours. The heart size and mediastinal contours are stable status post CABG. The lungs appear clear. There is no pleural effusion or pneumothorax. No acute osseous findings are evident status post lower cervical fusion and right shoulder reverse arthroplasty. Telemetry leads overlie the chest. IMPRESSION: Stable postoperative chest. No evidence of active cardiopulmonary process. Electronically Signed   By: Richardean Sale M.D.   On: 06/08/2021 08:19    Scheduled Meds:  acetaminophen  1,000 mg Oral Q8H   aspirin EC  81 mg Oral BID   atorvastatin  80 mg Oral QPM   furosemide  20 mg Oral Daily  metoprolol tartrate  25 mg Oral BID   pantoprazole  40 mg Oral QPM   traMADol  50-100 mg Oral Q6H   Continuous Infusions:  methocarbamol (ROBAXIN) IV       LOS: 1 day   Marylu Lund, MD [facility] Pager On Amion  If 7PM-7AM, please contact night-coverage 06/08/2021, 2:19 PM

## 2021-06-08 NOTE — Plan of Care (Signed)
  Problem: Education: Goal: Knowledge of General Education information will improve Description: Including pain rating scale, medication(s)/side effects and non-pharmacologic comfort measures Outcome: Progressing   Problem: Clinical Measurements: Goal: Will remain free from infection Outcome: Progressing   Problem: Clinical Measurements: Goal: Diagnostic test results will improve Outcome: Progressing   

## 2021-06-08 NOTE — Consult Note (Signed)
ORTHOPAEDIC CONSULTATION  Chief Complaint: left knee pain  HPI: Jake Dunn is a 81 y.o. male with a h/o Left TKA in 2010 by Dr. Amada Jupiter who complains of left knee/thigh pain since 06/06/21 when he was at home on the stairs and felt a sudden pop followed by severe pain. He began to fall down but was able to control his body as he fell to the ground. He has been unable to bear weight on the LLE since then and pain has been constant. Even the lightest touch or movement causes severe pain. He had EMS bring him to our office yesterday where xrays showed no obvious fracture or dislocation. Due to the amount of pain he was in and the fact he was unable to care for himself at home, we had him admitted to the hospital for further imaging to rule out other injuries.  MRI of the left knee shows nondisplaced periprosthetic fracture of the distal femoral metaphysis adjacent to the femoral stem component with extension into the distal femoral diametaphysis.     No history of MI, DVT, PE.  + h/o CABG and CVA. Previously ambulatory without the use of assistive devices.  The patient is living at home with his wife.    He does report an episode last summer similar to this where we was walking outside and his knee gave out and he fell to the ground.   Past Medical History:  Diagnosis Date   Adenomatous polyp    Anxiety    Arthritis    "knees, ankles, shoulders" (08/15/2016)   CAD (coronary artery disease)    s/p cath in 2014 showing significant stenosis in the diagonal side branches and in the RV marginal branch. These vessels were small and diffusely diseased. He is managed medically. 08/16/16 Cath, no disease progression   Chronic lower back pain    Disc disease, degenerative, cervical    GERD (gastroesophageal reflux disease)    Gout    Hyperlipidemia    Hypertension    Osteoarthritis    Renal cell carcinoma    left kidney removal   Stroke [facility])    MINI STROKE 15+ YRS AGO, no residual    Stroke ([place])    "I've had 2 or 3"; denies residual on 08/15/2016   Syncope and collapse    Past Surgical History:  Procedure Laterality Date   ANTERIOR CERVICAL DECOMP/DISCECTOMY FUSION  02/2004; 04/2005   [name] 09/20/2010; [name] 01/22/9449   APPLICATION OF ROBOTIC ASSISTANCE FOR SPINAL PROCEDURE N/A 01/02/2018   Procedure: APPLICATION OF ROBOTIC ASSISTANCE FOR SPINAL PROCEDURE;  Surgeon: Kristeen Miss, MD;  Location: [place];  Service: Neurosurgery;  Laterality: N/A;   BACK SURGERY     CARDIAC CATHETERIZATION  12/14/2008   ef 50-55%. SHOWED SIGNIFICANT STENOSIS AND TO DIAGONAL SIDE BRANCHES AND IN THE RIGHT VENTRICULAR MARGINAL BRANCH. THESE VESSELS WERE SMALL AND DIFFUSELY DISEASED   CARDIOVASCULAR STRESS TEST  12/10/2009   EF 61%. EKG negative. Mild peri ischemia. Managed medically.    CARPAL TUNNEL RELEASE Right 11/2005   [name] 09/20/2010   CARPAL TUNNEL RELEASE Left 06/2007   [name] 5/132012   COLONOSCOPY  11/2004   [name] 09/20/2010   COLONOSCOPY W/ BIOPSIES AND POLYPECTOMY  09/2002   [name] 09/20/2010   CORONARY ARTERY BYPASS GRAFT N/A 04/02/2019   Procedure: CORONARY ARTERY BYPASS GRAFTING (CABG) using LIMA to LAD; Endoscopically harvested right greater saphenous vein to the PLB, Ramus, and Diag 2.;  Surgeon: Lajuana Matte, MD;  Location[facility];  Service: Open Heart Surgery;  Laterality: N/A;   INCISION AND DRAINAGE ABSCESS Right 10/18/2016   Procedure: INCISION AND DRAINAGE ABSCESS RIGHT SHOULDER;  Surgeon: [name], MD;  Location: [place];  Service: Orthopedics;  Laterality: Right;   INCISION AND DRAINAGE OF WOUND Right 08/2005   shoulder/notes 09/20/2010   IR FLUORO GUIDE CV LINE LEFT  10/19/2016   IR US GUIDE VASC ACCESS LEFT  10/19/2016   IRRIGATION AND DEBRIDEMENT SHOULDER Right 12/22/2016   Procedure: IRRIGATION AND DEBRIDEMENT RIGHT SHOULDER;  Surgeon: [name], MD;  Location: [place];  Service: Orthopedics;  Laterality: Right;   JOINT  REPLACEMENT     LEFT HEART CATH AND CORONARY ANGIOGRAPHY N/A 08/16/2016   Procedure: Left Heart Cath and Coronary Angiography;  Surgeon: [name], MD;  Location: [place] CV LAB;  Service: Cardiovascular;  Laterality: N/A;   LEFT HEART CATH AND CORONARY ANGIOGRAPHY N/A 03/26/2019   Procedure: LEFT HEART CATH AND CORONARY ANGIOGRAPHY;  Surgeon: Martinique, Peter M, MD;  Location: [place] CV LAB;  Service: Cardiovascular;  Laterality: N/A;   LEFT HEART CATH AND CORS/GRAFTS ANGIOGRAPHY N/A 12/21/2020   Procedure: LEFT HEART CATH AND CORS/GRAFTS ANGIOGRAPHY;  Surgeon: Martinique, Peter M, MD;  Location: [place] CV LAB;  Service: Cardiovascular;  Laterality: N/A;   LEFT HEART CATHETERIZATION WITH CORONARY ANGIOGRAM N/A 08/09/2012   Procedure: LEFT HEART CATHETERIZATION WITH CORONARY ANGIOGRAM;  Surgeon: Peter M Martinique, MD;  Location: [facility] CATH LAB;  Service: Cardiovascular;  Laterality: N/A;   LUMBAR FUSION     NEPHRECTOMY Left early 2000s   REPLACEMENT TOTAL KNEE Right 08/2008   [name] 09/07/2010   SHOULDER ARTHROSCOPY WITH ROTATOR CUFF REPAIR Right 06/2005   [name] 09/20/2010   TOTAL KNEE ARTHROPLASTY  06/08/2011   Procedure: TOTAL KNEE ARTHROPLASTY;  Surgeon: [name], MD;  Location: [place];  Service: Orthopedics;  Laterality: Left;   TOTAL SHOULDER ARTHROPLASTY Right 04/06/2016   Procedure: RIGHT REVERSE TOTAL SHOULDER ARTHROPLASTY;  Surgeon: [name], MD;  Location: [place];  Service: Orthopedics;  Laterality: Right;   TOTAL SHOULDER REVISION Right 12/22/2016   Procedure: RIGHT SHOULDER GLENOID AND HUMERAL COMPONENT REVISION;  Surgeon: [name], MD;  Location: [place];  Service: Orthopedics;  Laterality: Right;   US ECHOCARDIOGRAPHY  12/15/2008   EF 60-65%   US ECHOCARDIOGRAPHY  09/28/2004   EF 55-60%   Social History   Socioeconomic History   Marital status: Married    Spouse name: Not on file   Number of children: Not on file   Years of education: Not on file   Highest  education level: Not on file  Occupational History   Not on file  Tobacco Use   Smoking status: Former    Types: Cigarettes    Quit date: 05/31/1989    Years since quitting: 32.0   Smokeless tobacco: Never   Tobacco comments:    08/15/2016 "hadn't smoked a carton of cigarettes all my life"  Vaping Use   Vaping Use: Never used  Substance and Sexual Activity   Alcohol use: Yes    Alcohol/week: 0.0 standard drinks    Comment: "sometimes daily, sometimes weekly" beer   Drug use: No   Sexual activity: Not Currently  Other Topics Concern   Not on file  Social History Narrative   Not on file   Social Determinants of Health   Financial Resource Strain: Not on file  Food Insecurity: Not on file  Transportation Needs: Not on file  Physical Activity: Not on file  Stress: Not on file  Social Connections: Not on file   Family History  Problem Relation Age of Onset   Heart attack Father    Heart disease Brother    Allergic rhinitis Neg Hx    Angioedema Neg Hx    Asthma Neg Hx    Atopy Neg Hx    Eczema Neg Hx    Immunodeficiency Neg Hx    Urticaria Neg Hx    Allergies  Allergen Reactions   Latex Rash    Other reaction(s): Hives/Skin Rash/Itching   Tape Rash    Paper tape okay Other reaction(s): Hives/Rash/Itching   Prior to Admission medications   Medication Sig Start Date End Date Taking? Authorizing Provider  acyclovir (ZOVIRAX) 400 MG tablet Take 400 mg by mouth 3 (three) times daily as needed (For cold sores). . 10/05/16  Yes [provider]  albuterol (VENTOLIN HFA) 108 (90 Base) MCG/ACT inhaler Inhale 2 puffs into the lungs every 6 (six) hours as needed for wheezing or shortness of breath. 08/06/20  Yes [provider]  allopurinol (ZYLOPRIM) 300 MG tablet Take 300 mg by mouth daily as needed (for gout flares).    Yes [provider]  aspirin EC 81 MG tablet Take 1 tablet (81 mg total) by mouth daily. 05/28/19  Yes Martinique, Peter M, MD   atorvastatin (LIPITOR) 80 MG tablet Take 1 tablet (80 mg total) by mouth every evening. 08/17/16  Yes Cheryln Manly, NP  Fluticasone-Umeclidin-Vilant (TRELEGY ELLIPTA) 100-62.5-25 MCG/INH AEPB Inhale 1 puff into the lungs daily. Patient taking differently: Inhale 1 puff into the lungs daily as needed (for shortness of breath). 09/14/20  Yes Olalere, Adewale A, MD  furosemide (LASIX) 20 MG tablet TAKE 1 TABLET(20 MG) BY MOUTH DAILY Patient taking differently: Take 20 mg by mouth daily. TAKE 1 TABLET(20 MG) BY MOUTH DAILY 01/07/21  Yes Martinique, Peter M, MD  HYDROcodone-acetaminophen (NORCO/VICODIN) 5-325 MG tablet Take 1 tablet by mouth 3 (three) times daily as needed for moderate pain.  02/11/20  Yes [provider]  LORazepam (ATIVAN) 0.5 MG tablet Take 0.5 mg by mouth 2 (two) times daily as needed for anxiety.  08/07/15  Yes [provider]  metoprolol tartrate (LOPRESSOR) 25 MG tablet Take 25 mg by mouth 2 (two) times daily.   Yes [provider]  pantoprazole (PROTONIX) 40 MG tablet Take 40 mg by mouth every evening.    Yes [provider]  doxycycline (VIBRA-TABS) 100 MG tablet Take 1 tablet (100 mg total) by mouth 2 (two) times daily. Patient not taking: Reported on 06/07/2021 01/18/21   Laurin Coder, MD   CT KNEE LEFT WO CONTRAST  Result Date: 06/07/2021 CLINICAL DATA:  Knee trauma, internal derangement suspected. History of prior surgery. EXAM: CT OF THE left KNEE WITHOUT CONTRAST TECHNIQUE: Multidetector CT imaging of the left knee was performed according to the standard protocol. Multiplanar CT image reconstructions were also generated. RADIATION DOSE REDUCTION: This exam was performed according to the departmental dose-optimization program which includes automated exposure control, adjustment of the mA and/or kV according to patient size and/or use of iterative reconstruction technique. COMPARISON:  06/08/2011. FINDINGS: Bones/Joint/Cartilage Total knee  arthroplasty changes are noted. No evidence of acute fracture or dislocation. No evidence of hardware loosening. Examination is limited due to hardware artifact. Ligaments Suboptimally assessed by CT. Muscles and Tendons Mild intramuscular fat stranding is noted in the posterior compartment of  the distal thigh. There is suboptimal visualization of the tendons due to hardware artifact at the knee. Soft tissues Extensive vascular calcifications are noted. There is a small suprapatellar joint effusion. IMPRESSION: 1. No acute fracture or dislocation. 2. Status post total knee arthroplasty with no evidence of hardware loosening. Evaluation is somewhat limited due to hardware artifact. 3. Small suprapatellar joint effusion. 4. Mild intramuscular fat stranding in the posterior compartment of the distal thigh, which may be infectious or inflammatory. Electronically Signed   By: Brett Fairy M.D.   On: 06/07/2021 21:30   MR KNEE LEFT WO CONTRAST  Result Date: 06/08/2021 CLINICAL DATA:  Left knee pain. Weakness. EXAM: MRI OF THE LEFT KNEE WITHOUT CONTRAST TECHNIQUE: Multiplanar, multisequence MR imaging of the knee was performed. No intravenous contrast was administered. COMPARISON:  None. FINDINGS: 06/08/2011 CT knee Bones/Joint/Cartilage Left total knee arthroplasty with susceptibility artifact partially obscuring the adjacent soft tissue and osseous structures. Periprosthetic linear signal abnormality in the distal medial metaphysis adjacent to the femoral stem component with extension into the distal femoral diametaphysis most consistent with a nondisplaced fracture. Normal alignment. Small joint effusion. No other marrow signal abnormality. Ligaments Collateral ligaments are intact. Muscles and Tendons Quadriceps tendon and patellar tendon are intact. Muscles are normal. Soft tissue No fluid collection or hematoma. No soft tissue mass. Nonspecific fat stranding in the subcutaneous fat along the posterior aspect of  the distal thigh likely related to trauma. IMPRESSION: Nondisplaced periprosthetic fracture of the distal femoral metaphysis adjacent to the femoral stem component with extension into the distal femoral diametaphysis. Electronically Signed   By: Kathreen Devoid M.D.   On: 06/08/2021 07:20   DG CHEST PORT 1 VIEW  Result Date: 06/08/2021 CLINICAL DATA:  Pre-op respiratory examination. History of hypertension, coronary artery disease and reflux. EXAM: PORTABLE CHEST 1 VIEW COMPARISON:  Radiographs 12/20/2020 and 10/12/2020.  CT 02/12/2010. FINDINGS: 0751 hours. The heart size and mediastinal contours are stable status post CABG. The lungs appear clear. There is no pleural effusion or pneumothorax. No acute osseous findings are evident status post lower cervical fusion and right shoulder reverse arthroplasty. Telemetry leads overlie the chest. IMPRESSION: Stable postoperative chest. No evidence of active cardiopulmonary process. Electronically Signed   By: Richardean Sale M.D.   On: 06/08/2021 08:19    Positive ROS: All other systems have been reviewed and were otherwise negative with the exception of those mentioned in the HPI and as above.  Objective: Labs cbc Recent Labs    06/07/21 1701  WBC 8.2  HGB 13.0  HCT 38.5*  PLT 191    Labs inflam No results for input(s): CRP in the last 72 hours.  Invalid input(s): ESR  Labs coag No results for input(s): INR, PTT in the last 72 hours.  Invalid input(s): PT  Recent Labs    06/07/21 1701  NA 136  K 3.7  CL 103  CO2 23  GLUCOSE 113*  BUN 17  CREATININE 1.10  CALCIUM 8.7*    Physical Exam: Vitals:   06/08/21 0906 06/08/21 1145  BP: (!) 148/71 127/81  Pulse: 68 64  Resp: 18 17  Temp: 98.5 F (36.9 C) 98.3 F (36.8 C)  SpO2: 94% 95%   General: Alert, no acute distress.  Laying in bed, calm, comfortable Mental status: Alert and Oriented x3 Neurologic: Speech Clear and organized, no gross focal findings or movement disorder  appreciated. Respiratory: No cyanosis, no use of accessory musculature Cardiovascular: No pedal edema GI: Abdomen is soft  and non-tender, non-distended. Skin: Warm and dry. Well healed surgical scar from prior TKA. Extremities: Warm and well perfused w/o edema Psychiatric: Patient is competent for consent with normal mood and affect  MUSCULOSKELETAL:  LLE in KI with ice to the anterior thigh. Quadriceps muscles are extremely tense but not TTP. Minimal edema. No ecchymosis. ROM at knee very minimal due to pain. ROM intact at ankle and toes. NVI.   Other extremities are atraumatic with painless ROM and NVI.  Assessment / Plan: Principal Problem:   Acute traumatic internal derangement of left knee Active Problems:   Hypertension   CAD (coronary artery disease)   Hyperlipidemia   Class 1 obesity    The periprosthetic fracture does not require surgical correction. We will treat it non-operatively by keeping him in the knee immobilizer for several weeks. He will be TDWB during this time and can use crutches or a walker to help with mobilization. I will place an order for PT to visit him to help go over safe mobilization techniques during this time period.   He is extremely anxious about discharge and adamant that he cannot go home as it is not safe for his wife to take care of him due to her own cardiac issues. He is open to a SNF placement. He did mentioned speaking to a daughter in [place] about possibly being able to help him. I will get SW involved to help with this.  Ice and elevate to reduce swelling.    Weightbearing: TDWB LLE Orthopedic device(s):  KI Showering: will likely need shower chair to maintain TDWB VTE prophylaxis: Aspirin 66m BID  x 30 days Pain control: Will increase Tylenol to 10069mq8h. Add Tramadol 1007m6h to try to decrease need for Oxy as often. Add Robaxin due to muscle tightness.  Follow - up plan:  1-2 weeks from discharge Contact information:   TimEdmonia Lynch, [facility]  MegBritt Bottom-C Office [phone]/2023 12:01 PM

## 2021-06-09 DIAGNOSIS — Z951 Presence of aortocoronary bypass graft: Secondary | ICD-10-CM | POA: Diagnosis not present

## 2021-06-09 DIAGNOSIS — I2510 Atherosclerotic heart disease of native coronary artery without angina pectoris: Secondary | ICD-10-CM | POA: Diagnosis not present

## 2021-06-09 DIAGNOSIS — E785 Hyperlipidemia, unspecified: Secondary | ICD-10-CM | POA: Diagnosis not present

## 2021-06-09 DIAGNOSIS — M9712XA Periprosthetic fracture around internal prosthetic left knee joint, initial encounter: Secondary | ICD-10-CM | POA: Diagnosis not present

## 2021-06-09 DIAGNOSIS — Z96653 Presence of artificial knee joint, bilateral: Secondary | ICD-10-CM | POA: Diagnosis not present

## 2021-06-09 DIAGNOSIS — Z79899 Other long term (current) drug therapy: Secondary | ICD-10-CM | POA: Diagnosis not present

## 2021-06-09 DIAGNOSIS — Z87891 Personal history of nicotine dependence: Secondary | ICD-10-CM | POA: Diagnosis not present

## 2021-06-09 DIAGNOSIS — S83105A Unspecified dislocation of left knee, initial encounter: Secondary | ICD-10-CM

## 2021-06-09 DIAGNOSIS — Z20822 Contact with and (suspected) exposure to covid-19: Secondary | ICD-10-CM | POA: Diagnosis not present

## 2021-06-09 DIAGNOSIS — M238X2 Other internal derangements of left knee: Secondary | ICD-10-CM | POA: Diagnosis not present

## 2021-06-09 DIAGNOSIS — I10 Essential (primary) hypertension: Secondary | ICD-10-CM | POA: Diagnosis not present

## 2021-06-09 DIAGNOSIS — Z91040 Latex allergy status: Secondary | ICD-10-CM | POA: Diagnosis not present

## 2021-06-09 DIAGNOSIS — Z7982 Long term (current) use of aspirin: Secondary | ICD-10-CM | POA: Diagnosis not present

## 2021-06-09 DIAGNOSIS — Z96641 Presence of right artificial hip joint: Secondary | ICD-10-CM | POA: Diagnosis not present

## 2021-06-09 DIAGNOSIS — Z8673 Personal history of transient ischemic attack (TIA), and cerebral infarction without residual deficits: Secondary | ICD-10-CM | POA: Diagnosis not present

## 2021-06-09 DIAGNOSIS — E669 Obesity, unspecified: Secondary | ICD-10-CM | POA: Diagnosis not present

## 2021-06-09 DIAGNOSIS — Z85528 Personal history of other malignant neoplasm of kidney: Secondary | ICD-10-CM | POA: Diagnosis not present

## 2021-06-09 NOTE — Hospital Course (Signed)
Jake Dunn is a 81 y.o. male with PMH adenomatous polyps, anxiety, arthritis, CAD s/p CABG, chronic lower back pain, GERD, gout, hyperlipidemia, hypertension, osteoarthritis, renal cell carcinoma with history of left nephrectomy, history of other nonhemorrhagic stroke who presented to the hospital after feeling a popping sensation in his left knee and had presented to his orthopedic surgery office.  He was referred to the ER. CT and MRI left knee were obtained.  His MRI showed nondisplaced periprosthetic fracture involving the distal femoral metaphysis adjacent to the femoral stem component with extension into distal femoral diametaphysis. He was evaluated by orthopedic surgery and recommended for nonoperative management.  Touchdown weightbearing to the left lower extremity recommended, aspirin for DVT prophylaxis and ongoing pain control.  PT/OT were also ordered and patient was amenable with discharging to SNF.

## 2021-06-09 NOTE — Assessment & Plan Note (Signed)
-   No longer on statin per patient

## 2021-06-09 NOTE — Plan of Care (Signed)
°  Problem: Education: Goal: Knowledge of General Education information will improve Description: Including pain rating scale, medication(s)/side effects and non-pharmacologic comfort measures Outcome: Progressing   Problem: Clinical Measurements: Goal: Diagnostic test results will improve Outcome: Progressing Goal: Respiratory complications will improve Outcome: Progressing   Problem: Nutrition: Goal: Adequate nutrition will be maintained Outcome: Progressing   Problem: Pain Managment: Goal: General experience of comfort will improve Outcome: Progressing

## 2021-06-09 NOTE — NC FL2 (Signed)
[place] MEDICAID FL2 LEVEL OF CARE SCREENING TOOL     IDENTIFICATION  Patient Name: Jake Dunn Birthdate: [birth year] Sex: male Admission Date (Current Location): 06/07/2021  [facility] and [place] Number:  Herbalist and Address:  [facility],  [place], [place]      Provider Number: [id number]  Attending Physician Name and Address:  Dwyane Dee, MD  Relative Name and Phone Number:  Ray,[name] Daughter [phone] Spouse [phone]  [phone] cell    Current Level of Care: Hospital Recommended Level of Care: [place] Prior Approval Number:    Date Approved/Denied:   PASRR Number: [id number]  Discharge Plan: SNF    Current Diagnoses: Patient Active Problem List   Diagnosis Date Noted   Class 1 obesity 06/07/2021   Acute traumatic internal derangement of left knee 06/07/2021   Chest pain 12/20/2020   S/P CABG x 4 04/02/2019   CAD, multiple vessel 03/29/2019   Anginal chest pain at rest [facility]) 03/29/2019   Spinal stenosis of lumbar region with radiculopathy 01/02/2018   Antibiotic drug intolerance 03/01/2017   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[place]) 12/22/2016   Medication monitoring encounter 11/15/2016   Abscess of right shoulder 10/18/2016   Unstable angina ([place]) 08/15/2016   Localized primary osteoarthritis of right shoulder region 04/06/2016   Coughing/wheezing 08/17/2015   Angioedema 08/17/2015   Hypertension    CAD (coronary artery disease)    Stroke [facility])    Hyperlipidemia    Dyspnea 08/07/2012   Pre-operative clearance 06/01/2011    Orientation RESPIRATION BLADDER Height & Weight     Self, Time, Situation, Place  Normal Continent Weight: 106.4 kg Height:  5\' 6"  (167.6 cm)  BEHAVIORAL SYMPTOMS/MOOD NEUROLOGICAL BOWEL NUTRITION STATUS      Continent Diet (Heart Healthy)  AMBULATORY STATUS COMMUNICATION OF NEEDS Skin   Extensive Assist Verbally Other (Comment) (Left knee  fracture)                       Personal Care Assistance Level of Assistance  Bathing, Feeding, Dressing Bathing Assistance: Maximum assistance Feeding assistance: Limited assistance Dressing Assistance: Maximum assistance     Functional Limitations Info  Sight, Hearing, Speech Sight Info: Adequate Hearing Info: Impaired Speech Info: Adequate    SPECIAL CARE FACTORS FREQUENCY  PT (By licensed PT), OT (By licensed OT)     PT Frequency: x5 week OT Frequency: x5 week            Contractures Contractures Info: Not present    Additional Factors Info  Code Status, Allergies Code Status Info: FULL Allergies Info: Latex, Tape           Current Medications (06/09/2021):  This is the current hospital active medication list Current Facility-Administered Medications  Medication Dose Route Frequency Provider Last Rate Last Admin   acetaminophen (TYLENOL) tablet 650 mg  650 mg Oral Q6H PRN Reubin Milan, MD   650 mg at 06/07/21 1709   Or   acetaminophen (TYLENOL) suppository 650 mg  650 mg Rectal Q6H PRN Reubin Milan, MD       acetaminophen (TYLENOL) tablet 1,000 mg  1,000 mg Oral Q8H Aggie Moats M, PA-C   1,000 mg at 06/09/21 1424   aspirin EC tablet 81 mg  81 mg Oral BID Aggie Moats M, PA-C   81 mg at 06/09/21 1013   atorvastatin (LIPITOR) tablet 80 mg  80 mg Oral QPM Reubin Milan, MD  80 mg at 06/08/21 1721   docusate sodium (COLACE) capsule 100 mg  100 mg Oral BID PRN Aggie Moats M, PA-C       furosemide (LASIX) tablet 20 mg  20 mg Oral Daily Reubin Milan, MD   20 mg at 06/09/21 1014   LORazepam (ATIVAN) tablet 0.5 mg  0.5 mg Oral BID PRN Reubin Milan, MD   0.5 mg at 06/07/21 1710   methocarbamol (ROBAXIN) tablet 500 mg  500 mg Oral Q8H PRN Britt Bottom, PA-C   500 mg at 06/09/21 0818   metoprolol tartrate (LOPRESSOR) tablet 25 mg  25 mg Oral BID Reubin Milan, MD   25 mg at 06/09/21 1014   ondansetron (ZOFRAN) tablet 4 mg   4 mg Oral Q6H PRN Reubin Milan, MD       Or   ondansetron [facility]) injection 4 mg  4 mg Intravenous Q6H PRN Reubin Milan, MD       oxyCODONE (Oxy IR/ROXICODONE) immediate release tablet 5 mg  5 mg Oral Q4H PRN Reubin Milan, MD   5 mg at 06/09/21 0819   pantoprazole (PROTONIX) EC tablet 40 mg  40 mg Oral QPM Reubin Milan, MD   40 mg at 06/08/21 1721   traMADol (ULTRAM) tablet 50-100 mg  50-100 mg Oral Q6H Aggie Moats M, PA-C   100 mg at 06/09/21 1236     Discharge Medications: Please see discharge summary for a list of discharge medications.  Relevant Imaging Results:  Relevant Lab Results:   Additional Information [phone]  Purcell Mouton, RN

## 2021-06-09 NOTE — Progress Notes (Signed)
° ° °  Subjective: Patient reports pain as mild to moderate. Much better control today after I changed his medicines. Says the Robaxin made the biggest difference. Tolerating diet. Urinating. No CP, SOB. Has worked with PT on [place] while maintaining TDWB status.   Objective:   VITALS:   Vitals:   06/08/21 1422 06/08/21 2148 06/09/21 0452 06/09/21 1014  BP: 122/73 116/65 137/73 139/74  Pulse: 71 67 (!) 59 61  Resp: 19 18 17    Temp: 98.1 F (36.7 C) 98.3 F (36.8 C) 97.8 F (36.6 C)   TempSrc: Oral Oral Oral   SpO2: 95% 96% 96%   Weight:      Height:       CBC Latest Ref Rng & Units 06/07/2021 12/20/2020 10/12/2020  WBC 4.0 - 10.5 K/uL 8.2 7.9 9.7  Hemoglobin 13.0 - 17.0 g/dL 13.0 14.9 14.7  Hematocrit 39.0 - 52.0 % 38.5(L) 44.6 44.4  Platelets 150 - 400 K/uL 191 273 196   BMP Latest Ref Rng & Units 06/07/2021 12/24/2020 12/21/2020  Glucose 70 - 99 mg/dL 113(H) 101(H) 107(H)  BUN 8 - 23 mg/dL 17 13 14   Creatinine 0.61 - 1.24 mg/dL 1.10 1.11 1.04  BUN/Creat Ratio 10 - 24 - 12 -  Sodium 135 - 145 mmol/L 136 139 137  Potassium 3.5 - 5.1 mmol/L 3.7 4.5 3.8  Chloride 98 - 111 mmol/L 103 104 107  CO2 22 - 32 mmol/L 23 20 22   Calcium 8.9 - 10.3 mg/dL 8.7(L) 9.3 8.6(L)   Intake/Output      01/31 0701 02/01 0700 02/01 0701 02/02 0700   P.O. 720 120   Total Intake(mL/kg) 720 (6.8) 120 (1.1)   Urine (mL/kg/hr) 1450 (0.6) 500 (0.9)   Total Output 1450 500   Net -730 -380           Physical Exam: General: NAD.  Sitting up in bedside chair, calm, comfortable Resp: No increased wob Cardio: regular rate and rhythm ABD soft Neurologically intact MSK Neurovascularly intact Sensation intact distally Intact pulses distally Knee flexion very limited d/t pain Dorsiflexion/Plantar flexion intact KI in place   Assessment: Left periprosthetic femur fracture   Principal Problem:   Acute traumatic internal derangement of left knee Active Problems:   Hypertension   CAD  (coronary artery disease)   Hyperlipidemia   Class 1 obesity   Plan: Non-op management of fracture in KI Advance diet Up with therapy Incentive Spirometry Elevate and Apply ice  Weightbearing: TDWB LLE Orthopedic device(s):  KI Showering: Keep dressing dry VTE prophylaxis: Aspirin 81mg  BID  x 30 days , SCDs, ambulation Pain control: Tylenol, Tramadol, Oxy, Robaxin PRN Follow - up plan:  1-2 weeks Contact information:  Edmonia Lynch MD, Aggie Moats PA-C  Dispo: Skilled Nursing Facility/Rehab since he feels going home is unsafe since his wife would be unable to take care of him. PT also recommending SNF. Awaiting TOC consult.     Britt Bottom, PA-C Office [phone] 06/09/2021, 12:15 PM

## 2021-06-09 NOTE — Evaluation (Signed)
Occupational Therapy Evaluation Patient Details Name: Jake Dunn MRN: [id number] DOB: 01/22/1941 Today's Date: 06/09/2021   History of Present Illness Patient is 81 y.o. male presented to the hospital after experiencing a sudden pop in his Lt knee and slowly falling to the ground. MRI of the left knee shows nondisplaced periprosthetic fracture of the distal femoral metaphysis adjacent to the femoral stem component with extension into the distal femoral diametaphysis. PMH significant MI, DVT, PE, CAD, HLD, HTN, OA, CABG, back surgery, Rt TKA in 2010, Rt TSA in 2017, and CVA.   Clinical Impression   Patient is a pleasant 81 year old male who was admitted for above. Patient was living at home with wife with independence in ADLs and IADLs prior level. Currently, patient is min A for transfers with increased cues for maintaining WB restrictions. Patient is max A for LB dressing/bathing tasks, and min A for toileting tasks impacting patients ability to transition home at this time. Patients wife is unable to offer physical assistance for ADLs.Patient would continue to benefit from skilled OT services at this time while admitted and after d/c to address noted deficits in order to improve overall safety and independence in ADLs.        Recommendations for follow up therapy are one component of a multi-disciplinary discharge planning process, led by the attending physician.  Recommendations may be updated based on patient status, additional functional criteria and insurance authorization.   Follow Up Recommendations  Skilled nursing-short term rehab (<3 hours/day)    Assistance Recommended at Discharge Frequent or constant Supervision/Assistance  Patient can return home with the following A little help with walking and/or transfers;A lot of help with bathing/dressing/bathroom;Assistance with cooking/housework;Direct supervision/assist for medications management;Assist for transportation;Help with stairs  or ramp for entrance    Functional Status Assessment  Patient has had a recent decline in their functional status and demonstrates the ability to make significant improvements in function in a reasonable and predictable amount of time.  Equipment Recommendations  None recommended by OT    Recommendations for Other Services       Precautions / Restrictions Precautions Precautions: Fall Restrictions Weight Bearing Restrictions: Yes LLE Weight Bearing: Touchdown weight bearing      Mobility Bed Mobility               General bed mobility comments: up in recliner    Transfers                          Balance Overall balance assessment: Needs assistance Sitting-balance support: Feet supported Sitting balance-Leahy Scale: Good     Standing balance support: Reliant on assistive device for balance, During functional activity, Bilateral upper extremity supported Standing balance-Leahy Scale: Poor                             ADL either performed or assessed with clinical judgement   ADL Overall ADL's : Needs assistance/impaired Eating/Feeding: Modified independent Eating/Feeding Details (indicate cue type and reason): sitting in recliner Grooming: Wash/dry face;Wash/dry hands;Sitting;Set up Grooming Details (indicate cue type and reason): in recliner Upper Body Bathing: Standing;Minimal assistance   Lower Body Bathing: Sit to/from stand;Sitting/lateral leans;Moderate assistance   Upper Body Dressing : Minimal assistance;Standing   Lower Body Dressing: Sit to/from stand;Sitting/lateral leans;Maximal assistance Lower Body Dressing Details (indicate cue type and reason): unable to get socks without AE. patient unable to don/doff KI. Toilet Transfer:  Minimal assistance;Cueing for safety;BSC/3in1;Rolling walker (2 wheels) Toilet Transfer Details (indicate cue type and reason): with increased time and cues to take it slow. noted to have increased risk  of breaking precautions with increased fatigue.                 Vision Patient Visual Report: No change from baseline       Perception     Praxis      Pertinent Vitals/Pain Pain Assessment Pain Assessment: Faces Faces Pain Scale: Hurts little more Pain Location: L knee Pain Descriptors / Indicators: Discomfort Pain Intervention(s): Monitored during session     Hand Dominance Right   Extremity/Trunk Assessment Upper Extremity Assessment Upper Extremity Assessment: Overall WFL for tasks assessed;RUE deficits/detail RUE Deficits / Details: noted to have a history of shoulder replacement with no internal rotation to resch behind back. able to range to about 110 degrees.   Lower Extremity Assessment Lower Extremity Assessment: Defer to PT evaluation   Cervical / Trunk Assessment Cervical / Trunk Assessment: Normal   Communication Communication Communication: No difficulties   Cognition Arousal/Alertness: Awake/alert Behavior During Therapy: WFL for tasks assessed/performed Overall Cognitive Status: Within Functional Limits for tasks assessed                                       General Comments       Exercises     Shoulder Instructions      Home Living Family/patient expects to be discharged to:: Private residence Living Arrangements: Spouse/significant other Available Help at Discharge: Family;Available PRN/intermittently Type of Home: House Home Access: Stairs to enter [employer] of Steps: 3 Entrance Stairs-Rails: Right;Left Home Layout: One level     Bathroom Shower/Tub: Occupational psychologist: Handicapped height Bathroom Accessibility: Yes   Home Equipment: Radio producer - single point;BSC/3in1;Shower seat          Prior Functioning/Environment Prior Level of Function : Independent/Modified Independent                        OT Problem List: Decreased activity tolerance;Decreased  coordination;Decreased knowledge of use of DME or AE;Decreased safety awareness;Decreased knowledge of precautions      OT Treatment/Interventions: Self-care/ADL training;DME and/or AE instruction;Therapeutic activities;Balance training;Therapeutic exercise;Neuromuscular education;Patient/family education    OT Goals(Current goals can be found in the care plan section) Acute Rehab OT Goals Patient Stated Goal: to get stronger to go home with wife OT Goal Formulation: With patient Time For Goal Achievement: 06/23/21 Potential to Achieve Goals: Good  OT Frequency: Min 2X/week    Co-evaluation              AM-PAC OT "6 Clicks" Daily Activity     Outcome Measure Help from another person eating meals?: None Help from another person taking care of personal grooming?: A Little Help from another person toileting, which includes using toliet, bedpan, or urinal?: A Lot Help from another person bathing (including washing, rinsing, drying)?: A Lot Help from another person to put on and taking off regular upper body clothing?: A Little Help from another person to put on and taking off regular lower body clothing?: A Lot 6 Click Score: 16   End of Session Equipment Utilized During Treatment: Gait belt;Rolling walker (2 wheels) Nurse Communication: Other (comment) (present during part of session)  Activity Tolerance: Patient tolerated treatment well Patient left: in chair;with  call bell/phone within reach;with chair alarm set  OT Visit Diagnosis: Unsteadiness on feet (R26.81);History of falling (Z91.81);Other abnormalities of gait and mobility (R26.89)                Time: 0149-9692 OT Time Calculation (min): 21 min Charges:  OT General Charges $OT Visit: 1 Visit OT Evaluation $OT Eval Low Complexity: 1 Low  Leota Sauers, MS Acute Rehabilitation Department Office# [phone] Pager# [phone]   Marcellina Millin 06/09/2021, 1:25 PM

## 2021-06-09 NOTE — Assessment & Plan Note (Signed)
-   Continue aspirin and metoprolol

## 2021-06-09 NOTE — Care Management Obs Status (Signed)
[place] NOTIFICATION   Patient Details  Name: Jake Dunn MRN: [id number] Date of Birth: 10/26/1940   Medicare Observation Status Notification Given:  Yes    Purcell Mouton, RN 06/09/2021, 2:55 PM

## 2021-06-09 NOTE — Progress Notes (Signed)
Progress Note    [name]   GGY:[id number]  DOB: [birth year]  DOA: 06/07/2021     1 PCP: Harlan Stains, MD  Initial CC: left knee pain  Hospital Course: [name] is a 81 y.o. male with PMH adenomatous polyps, anxiety, arthritis, CAD s/p CABG, chronic lower back pain, GERD, gout, hyperlipidemia, hypertension, osteoarthritis, renal cell carcinoma with history of left nephrectomy, history of other nonhemorrhagic stroke who presented to the hospital after feeling a popping sensation in his left knee and had presented to his orthopedic surgery office.  He was referred to the ER. CT and MRI left knee were obtained.  His MRI showed nondisplaced periprosthetic fracture involving the distal femoral metaphysis adjacent to the femoral stem component with extension into distal femoral diametaphysis. He was evaluated by orthopedic surgery and recommended for nonoperative management.  Touchdown weightbearing to the left lower extremity recommended, aspirin for DVT prophylaxis and ongoing pain control.  PT/OT were also ordered and patient was amenable with discharging to SNF.  Interval History:  No events overnight. Pain about 4/10 this am. He is tentatively hoping for going to short term rehab at discharge.  Knee immobilizer in place.    Assessment and Plan: * Acute traumatic internal derangement of left knee- (present on admission) - per MRI L Knee on 06/07/21: "Nondisplaced periprosthetic fracture of the distal femoral metaphysis adjacent to the femoral stem component with extension into the distal femoral diametaphysis" - continue PT/OT - continue asa 81 mg BID x 30 days for DVT ppx per ortho - TDWB to LLE - tentative plan is for SNF per patient request as well   Class 1 obesity- (present on admission) - Complicates overall prognosis and care - Body mass index is 37.87 kg/m.  Hyperlipidemia- (present on admission) - No longer on statin per patient  CAD (coronary artery disease)-  (present on admission) - Continue aspirin and metoprolol  Hypertension- (present on admission) - Continue Lopressor    Old records reviewed in assessment of this patient  Antimicrobials:   DVT prophylaxis: Aspirin  Code Status:   Code Status: Full Code  Disposition Plan:  Rehab Status is: Obs  Objective: Blood pressure 122/62, pulse 61, temperature 97.6 F (36.4 C), temperature source Oral, resp. rate 18, height 5\' 6"  (1.676 m), weight 106.4 kg, SpO2 97 %.  Examination:  Physical Exam Constitutional:      General: He is not in acute distress.    Appearance: Normal appearance.  HENT:     Head: Normocephalic and atraumatic.     Mouth/Throat:     Mouth: Mucous membranes are moist.  Eyes:     Extraocular Movements: Extraocular movements intact.  Cardiovascular:     Rate and Rhythm: Normal rate and regular rhythm.     Heart sounds: Normal heart sounds.  Pulmonary:     Effort: Pulmonary effort is normal. No respiratory distress.     Breath sounds: Normal breath sounds. No wheezing.  Abdominal:     General: Bowel sounds are normal. There is no distension.     Palpations: Abdomen is soft.     Tenderness: There is no abdominal tenderness.  Musculoskeletal:        General: Normal range of motion.     Cervical back: Normal range of motion and neck supple.     Comments: B/L LE edema noted in feet, 1+ Left knee immobilizer in place   Skin:    General: Skin is warm and dry.  Neurological:  General: No focal deficit present.     Mental Status: He is alert.  Psychiatric:        Mood and Affect: Mood normal.        Behavior: Behavior normal.     Consultants:    Procedures:    Data Reviewed: I have Reviewed nursing notes, Vitals, and Lab results since pt's last encounter. Pertinent lab results MRI left knee noted with periprosthetic fx of distal left femur   LOS: 1 day   Dwyane Dee, MD [facility] 06/09/2021, 5:54 PM

## 2021-06-09 NOTE — Assessment & Plan Note (Signed)
-  Stable overall -Continue current antihypertensive agents (Lopressor and Lasix). -Heart healthy/low-sodium diet discussed with patient. 

## 2021-06-09 NOTE — Evaluation (Signed)
Physical Therapy Evaluation Patient Details Name: Jake Dunn MRN: [id number] DOB: 1940-09-09 Today's Date: 06/09/2021  History of Present Illness  Patient is 81 y.o. male presented to the hospital after experiencing a sudden pop in his Lt knee and slowly falling to the ground. MRI of the left knee shows nondisplaced periprosthetic fracture of the distal femoral metaphysis adjacent to the femoral stem component with extension into the distal femoral diametaphysis. PMH significant MI, DVT, PE, CAD, HLD, HTN, OA, CABG, back surgery, Rt TKA in 2010, Rt TSA in 2017, and CVA.    Clinical Impression    URBAN NAVAL is 80 y.o. male admitted with above HPI and diagnosis. Patient is currently limited by functional impairments below (see PT problem list). Patient lives with his wife and is independent at baseline. Patient is now TDWB on Lt LE and required Min Assist for transfers and gait with RW. He was limited by dizziness and nausea during gait and distance limited. Patient will benefit from continued skilled PT interventions to address impairments and progress independence with mobility, recommending SNF for ST rehab to progress independence with transfers and gait for safe return home as wife is not able to provide physical assist needed. Acute PT will follow and progress as able.      Recommendations for follow up therapy are one component of a multi-disciplinary discharge planning process, led by the attending physician.  Recommendations may be updated based on patient status, additional functional criteria and insurance authorization.  Follow Up Recommendations Skilled nursing-short term rehab (<3 hours/day)    Assistance Recommended at Discharge Frequent or constant Supervision/Assistance  Patient can return home with the following  A little help with walking and/or transfers;A little help with bathing/dressing/bathroom;Assistance with cooking/housework;Assist for transportation;Help with  stairs or ramp for entrance    Equipment Recommendations Rolling walker (2 wheels)  Recommendations for Other Services       Functional Status Assessment Patient has had a recent decline in their functional status and demonstrates the ability to make significant improvements in function in a reasonable and predictable amount of time.     Precautions / Restrictions Precautions Precautions: Fall Restrictions Weight Bearing Restrictions: Yes LLE Weight Bearing: Touchdown weight bearing      Mobility  Bed Mobility Overal bed mobility: Needs Assistance Bed Mobility: Supine to Sit     Supine to sit: Min assist, HOB elevated     General bed mobility comments: cues to use bed rail, pt able to bring LE's off EOB and manage immobilizer without assist. assist needed to raise trunk up and steady at EOB.    Transfers Overall transfer level: Needs assistance Equipment used: Rolling walker (2 wheels) Transfers: Sit to/from Stand Sit to Stand: Min assist           General transfer comment: Min assist to power up from EOB, cues for hand placement for one on EOB and one on RW. pt completed with Rt SL stand due to TDWB status on Lt LE and immobilizer in place. additional stands from recliner with bil UE use for power up and min guard/ assist as pt fatigued.    Ambulation/Gait Ambulation/Gait assistance: Min assist Gait Distance (Feet): 14 Feet Assistive device: Rolling walker (2 wheels) Gait Pattern/deviations: Step-to pattern, Decreased stride length, Decreased step length - right, Decreased weight shift to left Gait velocity: decr     General Gait Details: patient required cues for WB status and to prevent weigth/pressur through Lt LE and only use light toe  touch for balance. pt sliding Lt foot forward and hopping Rt foot up to meet with steps. pt improved use of UE's on RW to maintain WB Status. pt c/o slight dizziness and nausea after short distance in hallway and seated rest  provided, symptoms subsided.  Stairs            Wheelchair Mobility    Modified Rankin (Stroke Patients Only)       Balance Overall balance assessment: Needs assistance Sitting-balance support: Feet supported Sitting balance-Leahy Scale: Good     Standing balance support: Reliant on assistive device for balance, During functional activity, Bilateral upper extremity supported Standing balance-Leahy Scale: Poor                               Pertinent Vitals/Pain      Home Living Family/patient expects to be discharged to:: Private residence Living Arrangements: Spouse/significant other Available Help at Discharge: Family;Available PRN/intermittently Type of Home: House Home Access: Stairs to enter Entrance Stairs-Rails: Psychiatric nurse of Steps: 3   Home Layout: One level Home Equipment: Standard Walker;Cane - single point;BSC/3in1;Shower seat      Prior Function Prior Level of Function : Independent/Modified Independent                     Hand Dominance   Dominant Hand: Right    Extremity/Trunk Assessment   Upper Extremity Assessment Upper Extremity Assessment: Overall WFL for tasks assessed    Lower Extremity Assessment Lower Extremity Assessment: LLE deficits/detail LLE Deficits / Details: pt to be in knee immobilizer with OOB activity, able to assist with SLR to position brace LLE Sensation: WNL LLE Coordination: WNL    Cervical / Trunk Assessment Cervical / Trunk Assessment: Normal  Communication   Communication: No difficulties  Cognition Arousal/Alertness: Awake/alert Behavior During Therapy: WFL for tasks assessed/performed Overall Cognitive Status: Within Functional Limits for tasks assessed                                          General Comments      Exercises Other Exercises Other Exercises: 5x sit<>stand with Rt LE single leg power up and bil UE press up from recliner. min  guard   Assessment/Plan    PT Assessment Patient needs continued PT services  PT Problem List Decreased strength;Decreased activity tolerance;Decreased range of motion;Decreased balance;Decreased mobility;Decreased knowledge of use of DME;Decreased knowledge of precautions       PT Treatment Interventions DME instruction;Gait training;Stair training;Functional mobility training;Therapeutic activities;Therapeutic exercise;Balance training;Patient/family education    PT Goals (Current goals can be found in the Care Plan section)  Acute Rehab PT Goals Patient Stated Goal: pt wants to get stronger before going home PT Goal Formulation: With patient/family Time For Goal Achievement: 06/23/21 Potential to Achieve Goals: Good    Frequency Min 3X/week     Co-evaluation               AM-PAC PT "6 Clicks" Mobility  Outcome Measure Help needed turning from your back to your side while in a flat bed without using bedrails?: None Help needed moving from lying on your back to sitting on the side of a flat bed without using bedrails?: A Little Help needed moving to and from a bed to a chair (including a wheelchair)?: A Little Help needed standing  up from a chair using your arms (e.g., wheelchair or bedside chair)?: A Little Help needed to walk in hospital room?: A Little Help needed climbing 3-5 steps with a railing? : A Lot 6 Click Score: 18    End of Session Equipment Utilized During Treatment: Gait belt;Left knee immobilizer Activity Tolerance: Patient tolerated treatment well Patient left: in chair;with call bell/phone within reach;with chair alarm set;with family/visitor present Nurse Communication: Mobility status PT Visit Diagnosis: Muscle weakness (generalized) (M62.81);Difficulty in walking, not elsewhere classified (R26.2);Other abnormalities of gait and mobility (R26.89)    Time: 8757-9728 PT Time Calculation (min) (ACUTE ONLY): 29 min   Charges:   PT Evaluation $PT  Eval Low Complexity: 1 Low PT Treatments $Gait Training: 8-22 mins        Verner Mould, DPT Acute Rehabilitation Services Office [phone] Pager [phone]   Jacques Navy 06/09/2021, 10:27 AM

## 2021-06-09 NOTE — Assessment & Plan Note (Signed)
-   Complicates overall prognosis and care - Body mass index is 37.87 kg/m.

## 2021-06-09 NOTE — Assessment & Plan Note (Addendum)
-   per MRI L Knee on 06/07/21: "Nondisplaced periprosthetic fracture of the distal femoral metaphysis adjacent to the femoral stem component with extension into the distal femoral diametaphysis" - continue PT/OT - continue asa 81 mg BID x 30 days for DVT ppx per ortho - TDWB to LLE - tentative plan is for SNF per patient request as well. Bed accepted at [facility]

## 2021-06-09 NOTE — TOC Progression Note (Signed)
Transition of Care [facility]) - Progression Note    Patient Details  Name: ROLLY MAGRI MRN: [id number] Date of Birth: 07/07/1940  Transition of Care [facility]) CM/SW Contact  Purcell Mouton, RN Phone Number: 06/09/2021, 2:44 PM  Clinical Narrative:    Spoke with pt concerning discharge plan to SNF/Rehab. Pt asked that his daughter is called first then his wife. A call was made to his daughter Ok Edwards with no Answer, VM was left concerning SNF placement. Pt was faxed to SNF in the area.    Expected Discharge Plan: [place] Barriers to Discharge: No Barriers Identified  Expected Discharge Plan and Services Expected Discharge Plan: [place]   Discharge Planning Services: CM Consult Post Acute Care Choice: [place] arrangements for the past 2 months: [place]                                       Social Determinants of Health (SDOH) Interventions    Readmission Risk Interventions Readmission Risk Prevention Plan 04/08/2019  Post Dischage Appt Complete  Medication Screening Complete  Transportation Screening Complete  Some recent data might be hidden

## 2021-06-10 DIAGNOSIS — M9712XA Periprosthetic fracture around internal prosthetic left knee joint, initial encounter: Secondary | ICD-10-CM | POA: Diagnosis not present

## 2021-06-10 DIAGNOSIS — S83105A Unspecified dislocation of left knee, initial encounter: Secondary | ICD-10-CM | POA: Diagnosis not present

## 2021-06-10 MED ORDER — POLYVINYL ALCOHOL 1.4 % OP SOLN
1.0000 [drp] | OPHTHALMIC | Status: DC | PRN
  Administered 2021-06-10: 1 [drp] via OPHTHALMIC
  Filled 2021-06-10: qty 15

## 2021-06-10 NOTE — Plan of Care (Signed)
°  Problem: Clinical Measurements: Goal: Diagnostic test results will improve Outcome: Progressing Goal: Respiratory complications will improve Outcome: Progressing Goal: Cardiovascular complication will be avoided Outcome: Progressing   Problem: Clinical Measurements: Goal: Diagnostic test results will improve Outcome: Progressing Goal: Respiratory complications will improve Outcome: Progressing Goal: Cardiovascular complication will be avoided Outcome: Progressing

## 2021-06-10 NOTE — Progress Notes (Signed)
Progress Note    Jake Dunn   PPJ:[id number]  DOB: 1940/08/18  DOA: 06/07/2021     1 PCP: Jake Stains, MD  Initial CC: left knee pain  Hospital Course: [name] is a 81 y.o. male with PMH adenomatous polyps, anxiety, arthritis, CAD s/p CABG, chronic lower back pain, GERD, gout, hyperlipidemia, hypertension, osteoarthritis, renal cell carcinoma with history of left nephrectomy, history of other nonhemorrhagic stroke who presented to the hospital after feeling a popping sensation in his left knee and had presented to his orthopedic surgery office.  He was referred to the ER. CT and MRI left knee were obtained.  His MRI showed nondisplaced periprosthetic fracture involving the distal femoral metaphysis adjacent to the femoral stem component with extension into distal femoral diametaphysis. He was evaluated by orthopedic surgery and recommended for nonoperative management.  Touchdown weightbearing to the left lower extremity recommended, aspirin for DVT prophylaxis and ongoing pain control.  PT/OT were also ordered and patient was amenable with discharging to SNF.  Interval History:  No events overnight. Pain okay today. Plan still is for SNF/rehab.    Assessment and Plan: * Acute traumatic internal derangement of left knee- (present on admission) - per MRI L Knee on 06/07/21: "Nondisplaced periprosthetic fracture of the distal femoral metaphysis adjacent to the femoral stem component with extension into the distal femoral diametaphysis" - continue PT/OT - continue asa 81 mg BID x 30 days for DVT ppx per ortho - TDWB to LLE - tentative plan is for SNF per patient request as well   Class 1 obesity- (present on admission) - Complicates overall prognosis and care - Body mass index is 37.87 kg/m.  Hyperlipidemia- (present on admission) - No longer on statin per patient  CAD (coronary artery disease)- (present on admission) - Continue aspirin and metoprolol  Hypertension-  (present on admission) - Continue Lopressor    Old records reviewed in assessment of this patient  Antimicrobials:   DVT prophylaxis: Aspirin  Code Status:   Code Status: Full Code  Disposition Plan:  Rehab Status is: Obs  Objective: Blood pressure (!) 141/74, pulse (!) 58, temperature 98.7 F (37.1 C), temperature source Oral, resp. rate 18, height 5\' 6"  (1.676 m), weight 106.4 kg, SpO2 96 %.  Examination:  Physical Exam Constitutional:      General: He is not in acute distress.    Appearance: Normal appearance.  HENT:     Head: Normocephalic and atraumatic.     Mouth/Throat:     Mouth: Mucous membranes are moist.  Eyes:     Extraocular Movements: Extraocular movements intact.  Cardiovascular:     Rate and Rhythm: Normal rate and regular rhythm.     Heart sounds: Normal heart sounds.  Pulmonary:     Effort: Pulmonary effort is normal. No respiratory distress.     Breath sounds: Normal breath sounds. No wheezing.  Abdominal:     General: Bowel sounds are normal. There is no distension.     Palpations: Abdomen is soft.     Tenderness: There is no abdominal tenderness.  Musculoskeletal:        General: Normal range of motion.     Cervical back: Normal range of motion and neck supple.     Comments: B/L LE edema noted in feet, 1+ Left knee immobilizer in place   Skin:    General: Skin is warm and dry.  Neurological:     General: No focal deficit present.     Mental Status:  He is alert.  Psychiatric:        Mood and Affect: Mood normal.        Behavior: Behavior normal.     Consultants:    Procedures:    Data Reviewed: I have Reviewed nursing notes, Vitals, and Lab results since pt's last encounter. Pertinent lab results MRI left knee noted with periprosthetic fx of distal left femur I have Reviewed nursing notes, Vitals, and Lab results since pt's last encounter. Pertinent lab results creat 1.1 I have reviewed the last note from nursing staff over past 24  hours,  I have discussed pt's care plan and test results with nursing staff, CM.    LOS: 1 day   Jake Dee, MD [facility] 06/10/2021, 6:27 PM

## 2021-06-10 NOTE — TOC Progression Note (Signed)
Transition of Care [facility]) - Progression Note    Patient Details  Name: Jake Dunn MRN: [id number] Date of Birth: 11-27-1940  [facility]) CM/SW Contact  Purcell Mouton, RN Phone Number: 06/10/2021, 3:14 PM  Clinical Narrative:    Wife selected Engineer, water for [employer]. Insurance authorization was started.    Expected Discharge Plan: [place] Barriers to Discharge: No Barriers Identified  Expected Discharge Plan and Services Expected Discharge Plan: [place]   Discharge Planning Services: CM Consult Post Acute Care Choice: [place] arrangements for the past 2 months: [place]                                       Social Determinants of Health (SDOH) Interventions    Readmission Risk Interventions Readmission Risk Prevention Plan 04/08/2019  Post Dischage Appt Complete  Medication Screening Complete  Transportation Screening Complete  Some recent data might be hidden

## 2021-06-11 DIAGNOSIS — K635 Polyp of colon: Secondary | ICD-10-CM | POA: Diagnosis not present

## 2021-06-11 DIAGNOSIS — Z87891 Personal history of nicotine dependence: Secondary | ICD-10-CM | POA: Diagnosis not present

## 2021-06-11 DIAGNOSIS — Z20822 Contact with and (suspected) exposure to covid-19: Secondary | ICD-10-CM | POA: Diagnosis not present

## 2021-06-11 DIAGNOSIS — K669 Disorder of peritoneum, unspecified: Secondary | ICD-10-CM | POA: Diagnosis not present

## 2021-06-11 DIAGNOSIS — Z743 Need for continuous supervision: Secondary | ICD-10-CM | POA: Diagnosis not present

## 2021-06-11 DIAGNOSIS — E669 Obesity, unspecified: Secondary | ICD-10-CM | POA: Diagnosis not present

## 2021-06-11 DIAGNOSIS — Z85528 Personal history of other malignant neoplasm of kidney: Secondary | ICD-10-CM | POA: Diagnosis not present

## 2021-06-11 DIAGNOSIS — K219 Gastro-esophageal reflux disease without esophagitis: Secondary | ICD-10-CM | POA: Diagnosis not present

## 2021-06-11 DIAGNOSIS — K5900 Constipation, unspecified: Secondary | ICD-10-CM | POA: Diagnosis not present

## 2021-06-11 DIAGNOSIS — Z951 Presence of aortocoronary bypass graft: Secondary | ICD-10-CM | POA: Diagnosis not present

## 2021-06-11 DIAGNOSIS — I2510 Atherosclerotic heart disease of native coronary artery without angina pectoris: Secondary | ICD-10-CM | POA: Diagnosis not present

## 2021-06-11 DIAGNOSIS — S83105A Unspecified dislocation of left knee, initial encounter: Secondary | ICD-10-CM | POA: Diagnosis not present

## 2021-06-11 DIAGNOSIS — D649 Anemia, unspecified: Secondary | ICD-10-CM | POA: Diagnosis not present

## 2021-06-11 DIAGNOSIS — M109 Gout, unspecified: Secondary | ICD-10-CM | POA: Diagnosis not present

## 2021-06-11 DIAGNOSIS — Z96641 Presence of right artificial hip joint: Secondary | ICD-10-CM | POA: Diagnosis not present

## 2021-06-11 DIAGNOSIS — E785 Hyperlipidemia, unspecified: Secondary | ICD-10-CM | POA: Diagnosis not present

## 2021-06-11 DIAGNOSIS — M1000 Idiopathic gout, unspecified site: Secondary | ICD-10-CM | POA: Diagnosis not present

## 2021-06-11 DIAGNOSIS — Z7982 Long term (current) use of aspirin: Secondary | ICD-10-CM | POA: Diagnosis not present

## 2021-06-11 DIAGNOSIS — Z8673 Personal history of transient ischemic attack (TIA), and cerebral infarction without residual deficits: Secondary | ICD-10-CM | POA: Diagnosis not present

## 2021-06-11 DIAGNOSIS — R531 Weakness: Secondary | ICD-10-CM | POA: Diagnosis not present

## 2021-06-11 DIAGNOSIS — Z96653 Presence of artificial knee joint, bilateral: Secondary | ICD-10-CM | POA: Diagnosis not present

## 2021-06-11 DIAGNOSIS — M238X2 Other internal derangements of left knee: Secondary | ICD-10-CM | POA: Diagnosis not present

## 2021-06-11 DIAGNOSIS — M9712XA Periprosthetic fracture around internal prosthetic left knee joint, initial encounter: Secondary | ICD-10-CM | POA: Diagnosis not present

## 2021-06-11 DIAGNOSIS — M2392 Unspecified internal derangement of left knee: Secondary | ICD-10-CM | POA: Diagnosis not present

## 2021-06-11 DIAGNOSIS — I10 Essential (primary) hypertension: Secondary | ICD-10-CM | POA: Diagnosis not present

## 2021-06-11 DIAGNOSIS — F419 Anxiety disorder, unspecified: Secondary | ICD-10-CM | POA: Diagnosis not present

## 2021-06-11 DIAGNOSIS — Z79899 Other long term (current) drug therapy: Secondary | ICD-10-CM | POA: Diagnosis not present

## 2021-06-11 DIAGNOSIS — Z91040 Latex allergy status: Secondary | ICD-10-CM | POA: Diagnosis not present

## 2021-06-11 DIAGNOSIS — M9712XD Periprosthetic fracture around internal prosthetic left knee joint, subsequent encounter: Secondary | ICD-10-CM | POA: Diagnosis not present

## 2021-06-11 DIAGNOSIS — M1990 Unspecified osteoarthritis, unspecified site: Secondary | ICD-10-CM | POA: Diagnosis not present

## 2021-06-11 LAB — RESP PANEL BY RT-PCR (FLU A&B, COVID) ARPGX2
Influenza A by PCR: NEGATIVE
Influenza B by PCR: NEGATIVE
SARS Coronavirus 2 by RT PCR: NEGATIVE

## 2021-06-11 MED ORDER — LORAZEPAM 0.5 MG PO TABS: 0.5000 mg | ORAL_TABLET | Freq: Two times a day (BID) | ORAL | 2 refills | Status: AC | PRN

## 2021-06-11 MED ORDER — HYDROCODONE-ACETAMINOPHEN 5-325 MG PO TABS: 1.0000 | ORAL_TABLET | Freq: Three times a day (TID) | ORAL | 0 refills | Status: AC | PRN

## 2021-06-11 MED ORDER — ASPIRIN 81 MG PO TBEC: 81.0000 mg | DELAYED_RELEASE_TABLET | Freq: Two times a day (BID) | ORAL | Status: DC

## 2021-06-11 NOTE — Discharge Summary (Signed)
Physician Discharge Summary   Patient: Jake Dunn MRN: [id number] DOB: 07-Dec-1940  Admit date:     06/07/2021  Discharge date: 06/11/21  Discharge Physician: Dwyane Dee   PCP: Harlan Stains, MD   Recommendations at discharge:    Follow up with orthopedic surgery  Discharge Diagnoses: Principal Problem:   Acute traumatic internal derangement of left knee Active Problems:   Hypertension   CAD (coronary artery disease)   Hyperlipidemia   Class 1 obesity  Resolved Problems:   * No resolved hospital problems. [facility] Course: Jake Dunn is a 81 y.o. male with PMH adenomatous polyps, anxiety, arthritis, CAD s/p CABG, chronic lower back pain, GERD, gout, hyperlipidemia, hypertension, osteoarthritis, renal cell carcinoma with history of left nephrectomy, history of other nonhemorrhagic stroke who presented to the hospital after feeling a popping sensation in his left knee and had presented to his orthopedic surgery office.  He was referred to the ER. CT and MRI left knee were obtained.  His MRI showed nondisplaced periprosthetic fracture involving the distal femoral metaphysis adjacent to the femoral stem component with extension into distal femoral diametaphysis. He was evaluated by orthopedic surgery and recommended for nonoperative management.  Touchdown weightbearing to the left lower extremity recommended, aspirin for DVT prophylaxis and ongoing pain control.  PT/OT were also ordered and patient was amenable with discharging to SNF.  Assessment and Plan: * Acute traumatic internal derangement of left knee- (present on admission) - per MRI L Knee on 06/07/21: "Nondisplaced periprosthetic fracture of the distal femoral metaphysis adjacent to the femoral stem component with extension into the distal femoral diametaphysis" - continue PT/OT - continue asa 81 mg BID x 30 days for DVT ppx per ortho - TDWB to LLE - tentative plan is for SNF per patient request as well. Bed  accepted at [facility]  Class 1 obesity- (present on admission) - Complicates overall prognosis and care - Body mass index is 37.87 kg/m.  Hyperlipidemia- (present on admission) - No longer on statin per patient  CAD (coronary artery disease)- (present on admission) - Continue aspirin and metoprolol  Hypertension- (present on admission) - Continue Lopressor         Pain control - [place] Controlled Substance Reporting System database was reviewed. and patient was instructed, not to drive, operate heavy machinery, perform activities at heights, swimming or participation in water activities or provide baby-sitting services while on Pain, Sleep and Anxiety Medications; until their outpatient Physician has advised to do so again. Also recommended to not to take more than prescribed Pain, Sleep and Anxiety Medications.   Consultants: Ortho Procedures performed: n/a  Disposition: Skilled nursing facility Diet recommendation:  Discharge Diet Orders (From admission, onward)     Start     Ordered   06/11/21 0000  Diet - low sodium heart healthy        06/11/21 1252           Cardiac diet  DISCHARGE MEDICATION: Allergies as of 06/11/2021       Reactions   Latex Rash   Other reaction(s): Hives/Skin Rash/Itching   Tape Rash   Paper tape okay Other reaction(s): Hives/Rash/Itching        Medication List     STOP taking these medications    atorvastatin 80 MG tablet Commonly known as: LIPITOR   doxycycline 100 MG tablet Commonly known as: VIBRA-TABS       TAKE these medications    acyclovir 400 MG tablet Commonly known as:  ZOVIRAX Take 400 mg by mouth 3 (three) times daily as needed (For cold sores). Marland Kitchen   albuterol 108 (90 Base) MCG/ACT inhaler Commonly known as: VENTOLIN HFA Inhale 2 puffs into the lungs every 6 (six) hours as needed for wheezing or shortness of breath.   allopurinol 300 MG tablet Commonly known as: ZYLOPRIM Take 300 mg by mouth daily  as needed (for gout flares).   aspirin 81 MG EC tablet Take 1 tablet (81 mg total) by mouth 2 (two) times daily. Swallow whole. What changed:  when to take this additional instructions   furosemide 20 MG tablet Commonly known as: LASIX TAKE 1 TABLET(20 MG) BY MOUTH DAILY What changed:  how much to take how to take this when to take this   HYDROcodone-acetaminophen 5-325 MG tablet Commonly known as: NORCO/VICODIN Take 1 tablet by mouth 3 (three) times daily as needed for moderate pain.   LORazepam 0.5 MG tablet Commonly known as: ATIVAN Take 1 tablet (0.5 mg total) by mouth 2 (two) times daily as needed for anxiety.   metoprolol tartrate 25 MG tablet Commonly known as: LOPRESSOR Take 25 mg by mouth 2 (two) times daily.   pantoprazole 40 MG tablet Commonly known as: PROTONIX Take 40 mg by mouth every evening.   Trelegy Ellipta 100-62.5-25 MCG/ACT Aepb Generic drug: Fluticasone-Umeclidin-Vilant Inhale 1 puff into the lungs daily. What changed:  when to take this reasons to take this         Discharge Exam: Filed Weights   06/07/21 1848  Weight: 106.4 kg  Physical Exam Constitutional:      General: He is not in acute distress.    Appearance: Normal appearance.  HENT:     Head: Normocephalic and atraumatic.     Mouth/Throat:     Mouth: Mucous membranes are moist.  Eyes:     Extraocular Movements: Extraocular movements intact.  Cardiovascular:     Rate and Rhythm: Normal rate and regular rhythm.     Heart sounds: Normal heart sounds.  Pulmonary:     Effort: Pulmonary effort is normal. No respiratory distress.     Breath sounds: Normal breath sounds. No wheezing.  Abdominal:     General: Bowel sounds are normal. There is no distension.     Palpations: Abdomen is soft.     Tenderness: There is no abdominal tenderness.  Musculoskeletal:        General: Normal range of motion.     Cervical back: Normal range of motion and neck supple.     Comments: B/L LE  edema noted in feet, 1+ Left knee immobilizer in place   Skin:    General: Skin is warm and dry.  Neurological:     General: No focal deficit present.     Mental Status: He is alert.  Psychiatric:        Mood and Affect: Mood normal.        Behavior: Behavior normal.     Condition at discharge: stable  The results of significant diagnostics from this hospitalization (including imaging, microbiology, ancillary and laboratory) are listed below for reference.   Imaging Studies: CT KNEE LEFT WO CONTRAST  Result Date: 06/07/2021 CLINICAL DATA:  Knee trauma, internal derangement suspected. History of prior surgery. EXAM: CT OF THE left KNEE WITHOUT CONTRAST TECHNIQUE: Multidetector CT imaging of the left knee was performed according to the standard protocol. Multiplanar CT image reconstructions were also generated. RADIATION DOSE REDUCTION: This exam was performed according to the departmental dose-optimization  program which includes automated exposure control, adjustment of the mA and/or kV according to patient size and/or use of iterative reconstruction technique. COMPARISON:  06/08/2011. FINDINGS: Bones/Joint/Cartilage Total knee arthroplasty changes are noted. No evidence of acute fracture or dislocation. No evidence of hardware loosening. Examination is limited due to hardware artifact. Ligaments Suboptimally assessed by CT. Muscles and Tendons Mild intramuscular fat stranding is noted in the posterior compartment of the distal thigh. There is suboptimal visualization of the tendons due to hardware artifact at the knee. Soft tissues Extensive vascular calcifications are noted. There is a small suprapatellar joint effusion. IMPRESSION: 1. No acute fracture or dislocation. 2. Status post total knee arthroplasty with no evidence of hardware loosening. Evaluation is somewhat limited due to hardware artifact. 3. Small suprapatellar joint effusion. 4. Mild intramuscular fat stranding in the posterior  compartment of the distal thigh, which may be infectious or inflammatory. Electronically Signed   By: Brett Fairy M.D.   On: 06/07/2021 21:30   MR KNEE LEFT WO CONTRAST  Result Date: 06/08/2021 CLINICAL DATA:  Left knee pain. Weakness. EXAM: MRI OF THE LEFT KNEE WITHOUT CONTRAST TECHNIQUE: Multiplanar, multisequence MR imaging of the knee was performed. No intravenous contrast was administered. COMPARISON:  None. FINDINGS: 06/08/2011 CT knee Bones/Joint/Cartilage Left total knee arthroplasty with susceptibility artifact partially obscuring the adjacent soft tissue and osseous structures. Periprosthetic linear signal abnormality in the distal medial metaphysis adjacent to the femoral stem component with extension into the distal femoral diametaphysis most consistent with a nondisplaced fracture. Normal alignment. Small joint effusion. No other marrow signal abnormality. Ligaments Collateral ligaments are intact. Muscles and Tendons Quadriceps tendon and patellar tendon are intact. Muscles are normal. Soft tissue No fluid collection or hematoma. No soft tissue mass. Nonspecific fat stranding in the subcutaneous fat along the posterior aspect of the distal thigh likely related to trauma. IMPRESSION: Nondisplaced periprosthetic fracture of the distal femoral metaphysis adjacent to the femoral stem component with extension into the distal femoral diametaphysis. Electronically Signed   By: Kathreen Devoid M.D.   On: 06/08/2021 07:20   DG CHEST PORT 1 VIEW  Result Date: 06/08/2021 CLINICAL DATA:  Pre-op respiratory examination. History of hypertension, coronary artery disease and reflux. EXAM: PORTABLE CHEST 1 VIEW COMPARISON:  Radiographs 12/20/2020 and 10/12/2020.  CT 02/12/2010. FINDINGS: 0751 hours. The heart size and mediastinal contours are stable status post CABG. The lungs appear clear. There is no pleural effusion or pneumothorax. No acute osseous findings are evident status post lower cervical fusion and  right shoulder reverse arthroplasty. Telemetry leads overlie the chest. IMPRESSION: Stable postoperative chest. No evidence of active cardiopulmonary process. Electronically Signed   By: Richardean Sale M.D.   On: 06/08/2021 08:19    Microbiology: Results for orders placed or performed during the hospital encounter of 06/07/21  Resp Panel by RT-PCR (Flu A&B, Covid) Nasopharyngeal Swab     Status: None   Collection Time: 06/07/21  6:15 PM   Specimen: Nasopharyngeal Swab; Nasopharyngeal(NP) swabs in vial transport medium  Result Value Ref Range Status   SARS Coronavirus 2 by RT PCR NEGATIVE NEGATIVE Final    Comment: (NOTE) SARS-CoV-2 target nucleic acids are NOT DETECTED.  The SARS-CoV-2 RNA is generally detectable in upper respiratory specimens during the acute phase of infection. The lowest concentration of SARS-CoV-2 viral copies this assay can detect is 138 copies/mL. A negative result does not preclude SARS-Cov-2 infection and should not be used as the sole basis for treatment or other patient management decisions.  A negative result may occur with  improper specimen collection/handling, submission of specimen other than nasopharyngeal swab, presence of viral mutation(s) within the areas targeted by this assay, and inadequate number of viral copies(<138 copies/mL). A negative result must be combined with clinical observations, patient history, and epidemiological information. The expected result is Negative.  Fact Sheet for Patients:  EntrepreneurPulse.com.au  Fact Sheet for Healthcare Providers:  IncredibleEmployment.be  This test is no t yet approved or cleared by the [place] FDA and  has been authorized for detection and/or diagnosis of SARS-CoV-2 by FDA under an Emergency Use Authorization (EUA). This EUA will remain  in effect (meaning this test can be used) for the duration of the COVID-19 declaration under Section 564(b)(1) of the  Act, 21 U.S.C.section 360bbb-3(b)(1), unless the authorization is terminated  or revoked sooner.       Influenza A by PCR NEGATIVE NEGATIVE Final   Influenza B by PCR NEGATIVE NEGATIVE Final    Comment: (NOTE) The Xpert Xpress SARS-CoV-2/FLU/RSV plus assay is intended as an aid in the diagnosis of influenza from Nasopharyngeal swab specimens and should not be used as a sole basis for treatment. Nasal washings and aspirates are unacceptable for Xpert Xpress SARS-CoV-2/FLU/RSV testing.  Fact Sheet for Patients: EntrepreneurPulse.com.au  Fact Sheet for Healthcare Providers: IncredibleEmployment.be  This test is not yet approved or cleared by the [place] FDA and has been authorized for detection and/or diagnosis of SARS-CoV-2 by FDA under an Emergency Use Authorization (EUA). This EUA will remain in effect (meaning this test can be used) for the duration of the COVID-19 declaration under Section 564(b)(1) of the Act, 21 U.S.C. section 360bbb-3(b)(1), unless the authorization is terminated or revoked.  Performed at [facility], [place] [address] [place], [place] [address]     Labs: CBC: Recent Labs  Lab 06/07/21 1701  WBC 8.2  HGB 13.0  HCT 38.5*  MCV 93.0  PLT 423   Basic Metabolic Panel: Recent Labs  Lab 06/07/21 1701  NA 136  K 3.7  CL 103  CO2 23  GLUCOSE 113*  BUN 17  CREATININE 1.10  CALCIUM 8.7*  MG 2.0   Liver Function Tests: Recent Labs  Lab 06/07/21 1701  AST 12*  ALT 14  ALKPHOS 80  BILITOT 1.3*  PROT 7.3  ALBUMIN 3.9   CBG: No results for input(s): GLUCAP in the last 168 hours.  Discharge time spent: greater than 30 minutes.  Signed: Dwyane Dee, MD [facility] 06/11/2021

## 2021-06-11 NOTE — TOC Progression Note (Signed)
Transition of Care [facility]) - Progression Note    Patient Details  Name: Jake Dunn MRN: [id number] Date of Birth: [birth year]  [facility]) CM/SW Contact  Purcell Mouton, RN Phone Number: 06/11/2021, 12:27 PM  Clinical Narrative:     Spoke with pt's wife who agreed with [facility]. [employer] was called to give new SNF name [facility]. Approval given. Will need COVID rapid test.   Expected Discharge Plan: [place] Barriers to Discharge: No Barriers Identified  Expected Discharge Plan and Services Expected Discharge Plan: [place]   Discharge Planning Services: CM Consult Post Acute Care Choice: [place] arrangements for the past 2 months: [place]                                       Social Determinants of Health (SDOH) Interventions    Readmission Risk Interventions Readmission Risk Prevention Plan 04/08/2019  Post Dischage Appt Complete  Medication Screening Complete  Transportation Screening Complete  Some recent data might be hidden

## 2021-06-11 NOTE — Progress Notes (Signed)
PT Cancellation Note  Patient Details Name: Jake Dunn MRN: [id number] DOB: 1940-06-24   Cancelled Treatment:    Reason Eval/Treat Not Completed:  (Pt prepping for d/c with [facility]. PT will follow up if pt remains in hospital setting.)   Festus Barren., PT, DPT  Acute Rehabilitation Services  Office [phone]  06/11/2021, 2:20 PM

## 2021-06-11 NOTE — TOC Progression Note (Signed)
Transition of Care [facility]) - Progression Note    Patient Details  Name: Jake Dunn MRN: [id number] Date of Birth: Jun 21, 1940  Transition of Care [facility]) CM/SW Contact  Purcell Mouton, RN Phone Number: 06/11/2021, 1:11 PM  Clinical Narrative:    Corey Harold was called will be here in 2 hours to transport. Wife was made aware and pt.    Expected Discharge Plan: [place] Barriers to Discharge: No Barriers Identified  Expected Discharge Plan and Services Expected Discharge Plan: [place]   Discharge Planning Services: CM Consult Post Acute Care Choice: [place] arrangements for the past 2 months: [place] Expected Discharge Date: 06/11/21                                     Social Determinants of Health (SDOH) Interventions    Readmission Risk Interventions Readmission Risk Prevention Plan 04/08/2019  Post Dischage Appt Complete  Medication Screening Complete  Transportation Screening Complete  Some recent data might be hidden

## 2021-06-12 DIAGNOSIS — M9712XD Periprosthetic fracture around internal prosthetic left knee joint, subsequent encounter: Secondary | ICD-10-CM | POA: Diagnosis not present

## 2021-06-12 DIAGNOSIS — I10 Essential (primary) hypertension: Secondary | ICD-10-CM | POA: Diagnosis not present

## 2021-06-12 DIAGNOSIS — I2510 Atherosclerotic heart disease of native coronary artery without angina pectoris: Secondary | ICD-10-CM | POA: Diagnosis not present

## 2021-06-12 DIAGNOSIS — K5900 Constipation, unspecified: Secondary | ICD-10-CM | POA: Diagnosis not present

## 2021-06-15 DIAGNOSIS — I10 Essential (primary) hypertension: Secondary | ICD-10-CM | POA: Diagnosis not present

## 2021-06-15 DIAGNOSIS — M109 Gout, unspecified: Secondary | ICD-10-CM | POA: Diagnosis not present

## 2021-06-15 DIAGNOSIS — K219 Gastro-esophageal reflux disease without esophagitis: Secondary | ICD-10-CM | POA: Diagnosis not present

## 2021-06-15 DIAGNOSIS — M2392 Unspecified internal derangement of left knee: Secondary | ICD-10-CM | POA: Diagnosis not present

## 2021-06-22 DIAGNOSIS — G8929 Other chronic pain: Secondary | ICD-10-CM | POA: Diagnosis not present

## 2021-06-22 DIAGNOSIS — Z85520 Personal history of malignant carcinoid tumor of kidney: Secondary | ICD-10-CM | POA: Diagnosis not present

## 2021-06-22 DIAGNOSIS — Z87891 Personal history of nicotine dependence: Secondary | ICD-10-CM | POA: Diagnosis not present

## 2021-06-22 DIAGNOSIS — Z905 Acquired absence of kidney: Secondary | ICD-10-CM | POA: Diagnosis not present

## 2021-06-22 DIAGNOSIS — M1990 Unspecified osteoarthritis, unspecified site: Secondary | ICD-10-CM | POA: Diagnosis not present

## 2021-06-22 DIAGNOSIS — M21371 Foot drop, right foot: Secondary | ICD-10-CM | POA: Diagnosis not present

## 2021-06-22 DIAGNOSIS — I10 Essential (primary) hypertension: Secondary | ICD-10-CM | POA: Diagnosis not present

## 2021-06-22 DIAGNOSIS — M109 Gout, unspecified: Secondary | ICD-10-CM | POA: Diagnosis not present

## 2021-06-22 DIAGNOSIS — M545 Low back pain, unspecified: Secondary | ICD-10-CM | POA: Diagnosis not present

## 2021-06-22 DIAGNOSIS — M9712XD Periprosthetic fracture around internal prosthetic left knee joint, subsequent encounter: Secondary | ICD-10-CM | POA: Diagnosis not present

## 2021-06-22 DIAGNOSIS — E669 Obesity, unspecified: Secondary | ICD-10-CM | POA: Diagnosis not present

## 2021-06-22 DIAGNOSIS — F419 Anxiety disorder, unspecified: Secondary | ICD-10-CM | POA: Diagnosis not present

## 2021-06-22 DIAGNOSIS — Z8673 Personal history of transient ischemic attack (TIA), and cerebral infarction without residual deficits: Secondary | ICD-10-CM | POA: Diagnosis not present

## 2021-06-22 DIAGNOSIS — E785 Hyperlipidemia, unspecified: Secondary | ICD-10-CM | POA: Diagnosis not present

## 2021-06-22 DIAGNOSIS — I2510 Atherosclerotic heart disease of native coronary artery without angina pectoris: Secondary | ICD-10-CM | POA: Diagnosis not present

## 2021-06-22 DIAGNOSIS — Z7982 Long term (current) use of aspirin: Secondary | ICD-10-CM | POA: Diagnosis not present

## 2021-06-23 DIAGNOSIS — M9712XA Periprosthetic fracture around internal prosthetic left knee joint, initial encounter: Secondary | ICD-10-CM | POA: Diagnosis not present

## 2021-07-12 DIAGNOSIS — M9712XA Periprosthetic fracture around internal prosthetic left knee joint, initial encounter: Secondary | ICD-10-CM | POA: Diagnosis not present

## 2021-07-19 DIAGNOSIS — S72402D Unspecified fracture of lower end of left femur, subsequent encounter for closed fracture with routine healing: Secondary | ICD-10-CM | POA: Diagnosis not present

## 2021-07-19 DIAGNOSIS — R269 Unspecified abnormalities of gait and mobility: Secondary | ICD-10-CM | POA: Diagnosis not present

## 2021-07-19 DIAGNOSIS — M6281 Muscle weakness (generalized): Secondary | ICD-10-CM | POA: Diagnosis not present

## 2021-07-21 DIAGNOSIS — M6281 Muscle weakness (generalized): Secondary | ICD-10-CM | POA: Diagnosis not present

## 2021-07-21 DIAGNOSIS — S72402D Unspecified fracture of lower end of left femur, subsequent encounter for closed fracture with routine healing: Secondary | ICD-10-CM | POA: Diagnosis not present

## 2021-07-21 DIAGNOSIS — R269 Unspecified abnormalities of gait and mobility: Secondary | ICD-10-CM | POA: Diagnosis not present

## 2021-07-28 DIAGNOSIS — S72402D Unspecified fracture of lower end of left femur, subsequent encounter for closed fracture with routine healing: Secondary | ICD-10-CM | POA: Diagnosis not present

## 2021-07-28 DIAGNOSIS — I5032 Chronic diastolic (congestive) heart failure: Secondary | ICD-10-CM | POA: Diagnosis not present

## 2021-07-28 DIAGNOSIS — M6281 Muscle weakness (generalized): Secondary | ICD-10-CM | POA: Diagnosis not present

## 2021-07-28 DIAGNOSIS — J441 Chronic obstructive pulmonary disease with (acute) exacerbation: Secondary | ICD-10-CM | POA: Diagnosis not present

## 2021-07-28 DIAGNOSIS — R269 Unspecified abnormalities of gait and mobility: Secondary | ICD-10-CM | POA: Diagnosis not present

## 2021-07-28 DIAGNOSIS — I10 Essential (primary) hypertension: Secondary | ICD-10-CM | POA: Diagnosis not present

## 2021-08-02 DIAGNOSIS — M6281 Muscle weakness (generalized): Secondary | ICD-10-CM | POA: Diagnosis not present

## 2021-08-02 DIAGNOSIS — R269 Unspecified abnormalities of gait and mobility: Secondary | ICD-10-CM | POA: Diagnosis not present

## 2021-08-02 DIAGNOSIS — S72402D Unspecified fracture of lower end of left femur, subsequent encounter for closed fracture with routine healing: Secondary | ICD-10-CM | POA: Diagnosis not present

## 2021-08-03 NOTE — Progress Notes (Signed)
?  ?Cardiology Office Note ? ? ?Date:  08/10/2021  ? ?ID:  Jake Dunn, DOB 03/27/1941, MRN [id number] ? ?PCP:  Harlan Stains, MD  ?Cardiologist:   Jarissa Sheriff Martinique, MD  ? ?Chief Complaint  ?Patient presents with  ? Follow-up  ? Coronary Artery Disease  ? ? ?  ?History of Present Illness: ?Jake Dunn is a 81 y.o. male who is seen for follow up CAD s/p CABG. He has a  PMH of CAD, GERD, HTN, HLD, renal cell CA s/p left nephrectomy. Cardiac catheterization in 2014 showed nonobstructive CAD with 70% lesion in third diagonal that was treated medically.  ? ? He presented with left sided chest pressure on 08/15/2016. He underwent cardiac catheterization on 08/16/2016, this revealed two-vessel CAD with moderate calcified stenosis in the mid LAD, second diagonal, ramus intermedius, ostial and proximal left circumflex and a severe stenosis of the second PLA branch of the RCA. Overall normal LVEDP and normal LVEF. Recommended  medical therapy and outpatient exercise stress Myoview. While the LAD and the diagonal only showed moderate progression compared to the 2010/2014 studies, the left circumflex and intermediate stenosis had progressed. Echocardiogram obtained on 08/17/2016 showed EF 25-36%, grade 2 diastolic dysfunction, severely dilated left atrium, PA peak pressure 30 mmHg. Lexiscan Myoview obtained on 08/23/2016 showed EF 51%, no perfusion defect at stress, overall low risk study.  ?   ?He was seen in November 2020 with symptoms of chest pain and dyspnea. This led to a cardiac cath showing 3 vessel obstructive CAD. He underwent CABG on 04/02/19 by Dr Kipp Brood. CABG X 4.  LIMA LAD, RSVG PLV, Ramus, and D2  (T graft off the ramus vein graft). Post op course complicated by Afib. He was loaded with amiodarone. On his  visit in January 2021 amiodarone and Eliquis were discontinued.  ? ?He did have a sleep study showing few hyponeic episodes without frank apnea. Did not meet criteria for CPAP trial.  ? ?He was admitted in  August 2022 with recurrent chest pain. Troponins were normal. He underwent cardiac cath which showed good perfusion and no culprit lesion. Subsequent Echo was unremarkable.  ? ?He was admitted in Feb 2023 with periprosthetic nondisplaced fracture of the knee. Treated conservatively by ortho. Just completed Rehab.  ? ?On follow up today he is doing very well. He has lost 7 lbs. BP is excellent. He denies any chest pain or dyspnea.  ? ? ? ?Past Medical History:  ?Diagnosis Date  ? Adenomatous polyp   ? Anxiety   ? Arthritis   ? "knees, ankles, shoulders" (08/15/2016)  ? CAD (coronary artery disease)   ? s/p cath in 2014 showing significant stenosis in the diagonal side branches and in the RV marginal branch. These vessels were small and diffusely diseased. He is managed medically. 08/16/16 Cath, no disease progression  ? Chronic lower back pain   ? Disc disease, degenerative, cervical   ? GERD (gastroesophageal reflux disease)   ? Gout   ? Hyperlipidemia   ? Hypertension   ? Osteoarthritis   ? Renal cell carcinoma   ? left kidney removal  ? Stroke [facility])   ? MINI STROKE 15+ YRS AGO, no residual  ? Stroke [facility])   ? "I've had 2 or 3"; denies residual on 08/15/2016  ? Syncope and collapse   ? ? ?Past Surgical History:  ?Procedure Laterality Date  ? ANTERIOR CERVICAL DECOMP/DISCECTOMY FUSION  02/2004; 04/2005  ? [name] 09/20/2010; [name] 09/20/2010  ? APPLICATION OF  ROBOTIC ASSISTANCE FOR SPINAL PROCEDURE N/A 01/02/2018  ? Procedure: APPLICATION OF ROBOTIC ASSISTANCE FOR SPINAL PROCEDURE;  Surgeon: Kristeen Miss, MD;  Location: [place];  Service: Neurosurgery;  Laterality: N/A;  ? BACK SURGERY    ? CARDIAC CATHETERIZATION  12/14/2008  ? ef 50-55%. SHOWED SIGNIFICANT STENOSIS AND TO DIAGONAL SIDE BRANCHES AND IN THE RIGHT VENTRICULAR MARGINAL BRANCH. THESE VESSELS WERE SMALL AND DIFFUSELY DISEASED  ? CARDIOVASCULAR STRESS TEST  12/10/2009  ? EF 61%. EKG negative. Mild peri ischemia. Managed medically.   ? CARPAL TUNNEL RELEASE Right  11/2005  ? [name] 09/20/2010  ? CARPAL TUNNEL RELEASE Left 06/2007  ? [name] [id number]  ? COLONOSCOPY  11/2004  ? [name] 09/20/2010  ? COLONOSCOPY W/ BIOPSIES AND POLYPECTOMY  09/2002  ? [name] 09/20/2010  ? CORONARY ARTERY BYPASS GRAFT N/A 04/02/2019  ? Procedure: CORONARY ARTERY BYPASS GRAFTING (CABG) using LIMA to LAD; Endoscopically harvested right greater saphenous vein to the PLB, Ramus, and Diag 2.;  Surgeon: Lajuana Matte, MD;  Location: [place];  Service: Open Heart Surgery;  Laterality: N/A;  ? INCISION AND DRAINAGE ABSCESS Right 10/18/2016  ? Procedure: INCISION AND DRAINAGE ABSCESS RIGHT SHOULDER;  Surgeon: [name], MD;  Location: [place];  Service: Orthopedics;  Laterality: Right;  ? INCISION AND DRAINAGE OF WOUND Right 08/2005  ? shoulder/notes 09/20/2010  ? IR FLUORO GUIDE CV LINE LEFT  10/19/2016  ? IR US GUIDE VASC ACCESS LEFT  10/19/2016  ? IRRIGATION AND DEBRIDEMENT SHOULDER Right 12/22/2016  ? Procedure: IRRIGATION AND DEBRIDEMENT RIGHT SHOULDER;  Surgeon: [name], MD;  Location: [place];  Service: Orthopedics;  Laterality: Right;  ? JOINT REPLACEMENT    ? LEFT HEART CATH AND CORONARY ANGIOGRAPHY N/A 08/16/2016  ? Procedure: Left Heart Cath and Coronary Angiography;  Surgeon: [name], MD;  Location: [place] CV LAB;  Service: Cardiovascular;  Laterality: N/A;  ? LEFT HEART CATH AND CORONARY ANGIOGRAPHY N/A 03/26/2019  ? Procedure: LEFT HEART CATH AND CORONARY ANGIOGRAPHY;  Surgeon: Martinique, Srihaan Mastrangelo M, MD;  Location: [place] CV LAB;  Service: Cardiovascular;  Laterality: N/A;  ? LEFT HEART CATH AND CORS/GRAFTS ANGIOGRAPHY N/A 12/21/2020  ? Procedure: LEFT HEART CATH AND CORS/GRAFTS ANGIOGRAPHY;  Surgeon: Martinique, Sharion Grieves M, MD;  Location: [place] CV LAB;  Service: Cardiovascular;  Laterality: N/A;  ? LEFT HEART CATHETERIZATION WITH CORONARY ANGIOGRAM N/A 08/09/2012  ? Procedure: LEFT HEART CATHETERIZATION WITH CORONARY ANGIOGRAM;  Surgeon: Jessice Madill M Martinique, MD;   Location: [facility] CATH LAB;  Service: Cardiovascular;  Laterality: N/A;  ? LUMBAR FUSION    ? NEPHRECTOMY Left early 2000s  ? REPLACEMENT TOTAL KNEE Right 08/2008  ? [name] 09/07/2010  ? SHOULDER ARTHROSCOPY WITH ROTATOR CUFF REPAIR Right 06/2005  ? [name] 09/20/2010  ? TOTAL KNEE ARTHROPLASTY  06/08/2011  ? Procedure: TOTAL KNEE ARTHROPLASTY;  Surgeon: [name], MD;  Location: [place];  Service: Orthopedics;  Laterality: Left;  ? TOTAL SHOULDER ARTHROPLASTY Right 04/06/2016  ? Procedure: RIGHT REVERSE TOTAL SHOULDER ARTHROPLASTY;  Surgeon: [name], MD;  Location: [place];  Service: Orthopedics;  Laterality: Right;  ? TOTAL SHOULDER REVISION Right 12/22/2016  ? Procedure: RIGHT SHOULDER GLENOID AND HUMERAL COMPONENT REVISION;  Surgeon: [name], MD;  Location: [place];  Service: Orthopedics;  Laterality: Right;  ? US ECHOCARDIOGRAPHY  12/15/2008  ? EF 60-65%  ? US ECHOCARDIOGRAPHY  09/28/2004  ? EF 55-60%  ? ? ? ?Current Outpatient Medications  ?Medication Sig Dispense Refill  ?  acyclovir (ZOVIRAX) 400 MG tablet Take 400 mg by mouth 3 (three) times daily as needed (For cold sores). .  5  ? allopurinol (ZYLOPRIM) 300 MG tablet Take 300 mg by mouth daily as needed (for gout flares).     ? atorvastatin (LIPITOR) 80 MG tablet Take 1 tablet (80 mg total) by mouth daily. 90 tablet 3  ? Fluticasone-Umeclidin-Vilant (TRELEGY ELLIPTA) 100-62.5-25 MCG/INH AEPB Inhale 1 puff into the lungs daily. (Patient taking differently: Inhale 1 puff into the lungs daily as needed (for shortness of breath).) 1 each 3  ? furosemide (LASIX) 20 MG tablet TAKE 1 TABLET(20 MG) BY MOUTH DAILY (Patient taking differently: Take 20 mg by mouth daily. TAKE 1 TABLET(20 MG) BY MOUTH DAILY) 30 tablet 5  ? HYDROcodone-acetaminophen (NORCO/VICODIN) 5-325 MG tablet Take 1 tablet by mouth 3 (three) times daily as needed for moderate pain. 30 tablet 0  ? LORazepam (ATIVAN) 0.5 MG tablet Take 1 tablet (0.5 mg total) by mouth 2 (two) times daily as  needed for anxiety. 30 tablet 2  ? metoprolol tartrate (LOPRESSOR) 25 MG tablet Take 25 mg by mouth 2 (two) times daily.    ? pantoprazole (PROTONIX) 40 MG tablet Take 40 mg by mouth every evening.     ? aspirin

## 2021-08-09 DIAGNOSIS — M545 Low back pain, unspecified: Secondary | ICD-10-CM | POA: Diagnosis not present

## 2021-08-09 DIAGNOSIS — M25562 Pain in left knee: Secondary | ICD-10-CM | POA: Diagnosis not present

## 2021-08-09 DIAGNOSIS — M25552 Pain in left hip: Secondary | ICD-10-CM | POA: Diagnosis not present

## 2021-08-10 ENCOUNTER — Ambulatory Visit (INDEPENDENT_AMBULATORY_CARE_PROVIDER_SITE_OTHER): Payer: Medicare Other | Admitting: Cardiology

## 2021-08-10 ENCOUNTER — Encounter: Payer: Self-pay | Admitting: Cardiology

## 2021-08-10 VITALS — BP 124/78 | HR 68 | Ht 67.0 in | Wt 224.0 lb

## 2021-08-10 DIAGNOSIS — I10 Essential (primary) hypertension: Secondary | ICD-10-CM | POA: Diagnosis not present

## 2021-08-10 DIAGNOSIS — E7800 Pure hypercholesterolemia, unspecified: Secondary | ICD-10-CM | POA: Diagnosis not present

## 2021-08-10 DIAGNOSIS — Z951 Presence of aortocoronary bypass graft: Secondary | ICD-10-CM | POA: Diagnosis not present

## 2021-08-10 DIAGNOSIS — I25118 Atherosclerotic heart disease of native coronary artery with other forms of angina pectoris: Secondary | ICD-10-CM | POA: Diagnosis not present

## 2021-08-10 MED ORDER — ASPIRIN 81 MG PO TBEC: 81.0000 mg | DELAYED_RELEASE_TABLET | Freq: Every day | ORAL | Status: DC

## 2021-08-10 MED ORDER — ATORVASTATIN CALCIUM 80 MG PO TABS: 80.0000 mg | ORAL_TABLET | Freq: Every day | ORAL | 3 refills | Status: AC

## 2021-08-23 DIAGNOSIS — G894 Chronic pain syndrome: Secondary | ICD-10-CM | POA: Diagnosis not present

## 2021-08-23 DIAGNOSIS — I700 Atherosclerosis of aorta: Secondary | ICD-10-CM | POA: Diagnosis not present

## 2021-08-23 DIAGNOSIS — I2510 Atherosclerotic heart disease of native coronary artery without angina pectoris: Secondary | ICD-10-CM | POA: Diagnosis not present

## 2021-08-23 DIAGNOSIS — I10 Essential (primary) hypertension: Secondary | ICD-10-CM | POA: Diagnosis not present

## 2021-09-20 DIAGNOSIS — Z86010 Personal history of colonic polyps: Secondary | ICD-10-CM | POA: Diagnosis not present

## 2021-09-24 DIAGNOSIS — M48062 Spinal stenosis, lumbar region with neurogenic claudication: Secondary | ICD-10-CM | POA: Diagnosis not present

## 2021-10-05 DIAGNOSIS — M5117 Intervertebral disc disorders with radiculopathy, lumbosacral region: Secondary | ICD-10-CM | POA: Diagnosis not present

## 2021-10-05 DIAGNOSIS — M5416 Radiculopathy, lumbar region: Secondary | ICD-10-CM | POA: Diagnosis not present

## 2021-11-23 DIAGNOSIS — I10 Essential (primary) hypertension: Secondary | ICD-10-CM | POA: Diagnosis not present

## 2021-11-23 DIAGNOSIS — J449 Chronic obstructive pulmonary disease, unspecified: Secondary | ICD-10-CM | POA: Diagnosis not present

## 2021-11-23 DIAGNOSIS — E785 Hyperlipidemia, unspecified: Secondary | ICD-10-CM | POA: Diagnosis not present

## 2021-11-23 DIAGNOSIS — I5032 Chronic diastolic (congestive) heart failure: Secondary | ICD-10-CM | POA: Diagnosis not present

## 2021-12-02 DIAGNOSIS — E785 Hyperlipidemia, unspecified: Secondary | ICD-10-CM | POA: Diagnosis not present

## 2021-12-02 DIAGNOSIS — I480 Paroxysmal atrial fibrillation: Secondary | ICD-10-CM | POA: Diagnosis not present

## 2021-12-02 DIAGNOSIS — Z0000 Encounter for general adult medical examination without abnormal findings: Secondary | ICD-10-CM | POA: Diagnosis not present

## 2021-12-02 DIAGNOSIS — I10 Essential (primary) hypertension: Secondary | ICD-10-CM | POA: Diagnosis not present

## 2021-12-07 DIAGNOSIS — K5730 Diverticulosis of large intestine without perforation or abscess without bleeding: Secondary | ICD-10-CM | POA: Diagnosis not present

## 2021-12-07 DIAGNOSIS — Z800 Family history of malignant neoplasm of digestive organs: Secondary | ICD-10-CM | POA: Diagnosis not present

## 2021-12-07 DIAGNOSIS — K648 Other hemorrhoids: Secondary | ICD-10-CM | POA: Diagnosis not present

## 2021-12-07 DIAGNOSIS — Z09 Encounter for follow-up examination after completed treatment for conditions other than malignant neoplasm: Secondary | ICD-10-CM | POA: Diagnosis not present

## 2021-12-07 DIAGNOSIS — D123 Benign neoplasm of transverse colon: Secondary | ICD-10-CM | POA: Diagnosis not present

## 2021-12-07 DIAGNOSIS — Z86010 Personal history of colonic polyps: Secondary | ICD-10-CM | POA: Diagnosis not present

## 2021-12-09 DIAGNOSIS — D123 Benign neoplasm of transverse colon: Secondary | ICD-10-CM | POA: Diagnosis not present

## 2021-12-10 ENCOUNTER — Other Ambulatory Visit: Payer: Self-pay | Admitting: Cardiology

## 2022-02-05 NOTE — Progress Notes (Unsigned)
Cardiology Office Note   Date:  02/05/2022   ID:  Jake, Dunn May 19, 1940, MRN [id number]  PCP:  Jake Stains, Dunn  Cardiologist:   Jake Reihl Martinique, Dunn   No chief complaint on file.     History of Present Illness: Jake Dunn is a 81 y.o. male who is seen for follow up CAD s/p CABG. He has a  PMH of CAD, GERD, HTN, HLD, renal cell CA s/p left nephrectomy. Cardiac catheterization in 2014 showed nonobstructive CAD with 70% lesion in third diagonal that was treated medically.    He presented with left sided chest pressure on 08/15/2016. He underwent cardiac catheterization on 08/16/2016, this revealed two-vessel CAD with moderate calcified stenosis in the mid LAD, second diagonal, ramus intermedius, ostial and proximal left circumflex and a severe stenosis of the second PLA branch of the RCA. Overall normal LVEDP and normal LVEF. Recommended  medical therapy and outpatient exercise stress Myoview. While the LAD and the diagonal only showed moderate progression compared to the 2010/2014 studies, the left circumflex and intermediate stenosis had progressed. Echocardiogram obtained on 08/17/2016 showed EF 47-42%, grade 2 diastolic dysfunction, severely dilated left atrium, PA peak pressure 30 mmHg. Lexiscan Myoview obtained on 08/23/2016 showed EF 51%, no perfusion defect at stress, overall low risk study.     He was seen in November 2020 with symptoms of chest pain and dyspnea. This led to a cardiac cath showing 3 vessel obstructive CAD. He underwent CABG on 04/02/19 by Dr Jake Dunn. CABG X 4.  LIMA LAD, RSVG PLV, Ramus, and D2  (T graft off the ramus vein graft). Post op course complicated by Afib. He was loaded with amiodarone. On his  visit in January 2021 amiodarone and Eliquis were discontinued.   He did have a sleep study showing few hyponeic episodes without frank apnea. Did not meet criteria for CPAP trial.   He was admitted in August 2022 with recurrent chest pain. Troponins were  normal. He underwent cardiac cath which showed good perfusion and no culprit lesion. Subsequent Echo was unremarkable.   He was admitted in Feb 2023 with periprosthetic nondisplaced fracture of the knee. Treated conservatively by ortho. Just completed Rehab.   On follow up today he is doing very well. He has lost 7 lbs. BP is excellent. He denies any chest pain or dyspnea.     Past Medical History:  Diagnosis Date   Adenomatous polyp    Anxiety    Arthritis    "knees, ankles, shoulders" (08/15/2016)   CAD (coronary artery disease)    s/p cath in 2014 showing significant stenosis in the diagonal side branches and in the RV marginal branch. These vessels were small and diffusely diseased. He is managed medically. 08/16/16 Cath, no disease progression   Chronic lower back pain    Disc disease, degenerative, cervical    GERD (gastroesophageal reflux disease)    Gout    Hyperlipidemia    Hypertension    Osteoarthritis    Renal cell carcinoma    left kidney removal   Stroke Jake Dunn)    MINI STROKE 15+ YRS AGO, no residual   Stroke (Jake Dunn)    "I've had 2 or 3"; denies residual on 08/15/2016   Syncope and collapse     Past Surgical History:  Procedure Laterality Date   ANTERIOR CERVICAL DECOMP/DISCECTOMY FUSION  02/2004; 04/2005   Jake Dunn 09/20/2010; Jake Dunn 5/[id number]/6387   APPLICATION OF ROBOTIC ASSISTANCE FOR SPINAL PROCEDURE N/A 01/02/2018   Procedure:  APPLICATION OF ROBOTIC ASSISTANCE FOR SPINAL PROCEDURE;  Surgeon: Jake Miss, Dunn;  Location: [place];  Service: Neurosurgery;  Laterality: N/A;   BACK SURGERY     CARDIAC CATHETERIZATION  12/14/2008   ef 50-55%. SHOWED SIGNIFICANT STENOSIS AND TO DIAGONAL SIDE BRANCHES AND IN THE RIGHT VENTRICULAR MARGINAL BRANCH. THESE VESSELS WERE SMALL AND DIFFUSELY DISEASED   CARDIOVASCULAR STRESS TEST  12/10/2009   EF 61%. EKG negative. Mild peri ischemia. Managed medically.    CARPAL TUNNEL RELEASE Right 11/2005   Jake Dunn 09/20/2010   CARPAL TUNNEL RELEASE Left  06/2007   Jake Dunn [id number]   COLONOSCOPY  11/2004   Jake Dunn 09/20/2010   COLONOSCOPY W/ BIOPSIES AND POLYPECTOMY  09/2002   Jake Dunn 09/20/2010   CORONARY ARTERY BYPASS GRAFT N/A 04/02/2019   Procedure: CORONARY ARTERY BYPASS GRAFTING (CABG) using LIMA to LAD; Endoscopically harvested right greater saphenous vein to the PLB, Ramus, and Diag 2.;  Surgeon: Jake Matte, Dunn;  Location: [place] [facility];  Service: Open Heart Surgery;  Laterality: N/A;   INCISION AND DRAINAGE ABSCESS Right 10/18/2016   Procedure: INCISION AND DRAINAGE ABSCESS RIGHT SHOULDER;  Surgeon: Jake Butters, Dunn;  Location: [place] [facility];  Service: Orthopedics;  Laterality: Right;   INCISION AND DRAINAGE OF WOUND Right 08/2005   shoulder/notes 09/20/2010   IR FLUORO GUIDE CV LINE LEFT  10/19/2016   IR US GUIDE VASC ACCESS LEFT  10/19/2016   IRRIGATION AND DEBRIDEMENT SHOULDER Right 12/22/2016   Procedure: IRRIGATION AND DEBRIDEMENT RIGHT SHOULDER;  Surgeon: Jake Lights, Dunn;  Location: [place] [facility];  Service: Orthopedics;  Laterality: Right;   JOINT REPLACEMENT     LEFT HEART CATH AND CORONARY ANGIOGRAPHY N/A 08/16/2016   Procedure: Left Heart Cath and Coronary Angiography;  Surgeon: Jake Mocha, Dunn;  Location: [facility];  Service: Cardiovascular;  Laterality: N/A;   LEFT HEART CATH AND CORONARY ANGIOGRAPHY N/A 03/26/2019   Procedure: LEFT HEART CATH AND CORONARY ANGIOGRAPHY;  Surgeon: Jake Dunn;  Location: [place] [facility];  Service: Cardiovascular;  Laterality: N/A;   LEFT HEART CATH AND CORS/GRAFTS ANGIOGRAPHY N/A 12/21/2020   Procedure: LEFT HEART CATH AND CORS/GRAFTS ANGIOGRAPHY;  Surgeon: Jake Dunn;  Location: [place] [facility];  Service: Cardiovascular;  Laterality: N/A;   LEFT HEART CATHETERIZATION WITH CORONARY ANGIOGRAM N/A 08/09/2012   Procedure: LEFT HEART CATHETERIZATION WITH CORONARY ANGIOGRAM;  Surgeon: Jake Kuznicki Dunn Martinique, Dunn;  Location: [facility] CATH [facility];  Service: Cardiovascular;   Laterality: N/A;   LUMBAR FUSION     NEPHRECTOMY Left early 2000s   REPLACEMENT TOTAL KNEE Right 08/2008   Jake Dunn 09/07/2010   SHOULDER ARTHROSCOPY WITH ROTATOR CUFF REPAIR Right 06/2005   Jake Dunn 09/20/2010   TOTAL KNEE ARTHROPLASTY  06/08/2011   Procedure: TOTAL KNEE ARTHROPLASTY;  Surgeon: Jake Lights, Dunn;  Location: [place];  Service: Orthopedics;  Laterality: Left;   TOTAL SHOULDER ARTHROPLASTY Right 04/06/2016   Procedure: RIGHT REVERSE TOTAL SHOULDER ARTHROPLASTY;  Surgeon: Jake Lights, Dunn;  Location: [place];  Service: Orthopedics;  Laterality: Right;   TOTAL SHOULDER REVISION Right 12/22/2016   Procedure: RIGHT SHOULDER GLENOID AND HUMERAL COMPONENT REVISION;  Surgeon: Jake Lights, Dunn;  Location: [place];  Service: Orthopedics;  Laterality: Right;   US ECHOCARDIOGRAPHY  12/15/2008   EF 60-65%   US ECHOCARDIOGRAPHY  09/28/2004   EF 55-60%     Current Outpatient Medications  Medication Sig Dispense Refill   acyclovir (ZOVIRAX) 400 MG tablet Take 400 mg  by mouth 3 (three) times daily as needed (For cold sores). .  5   allopurinol (ZYLOPRIM) 300 MG tablet Take 300 mg by mouth daily as needed (for gout flares).      aspirin 81 MG EC tablet Take 1 tablet (81 mg total) by mouth daily. Swallow whole. 30 tablet    atorvastatin (LIPITOR) 80 MG tablet Take 1 tablet (80 mg total) by mouth daily. 90 tablet 3   Fluticasone-Umeclidin-Vilant (TRELEGY ELLIPTA) 100-62.5-25 MCG/INH AEPB Inhale 1 puff into the lungs daily. (Patient taking differently: Inhale 1 puff into the lungs daily as needed (for shortness of breath).) 1 each 3   furosemide (LASIX) 20 MG tablet TAKE ONE TABLET BY MOUTH DAILY 30 tablet 5   HYDROcodone-acetaminophen (NORCO/VICODIN) 5-325 MG tablet Take 1 tablet by mouth 3 (three) times daily as needed for moderate pain. 30 tablet 0   LORazepam (ATIVAN) 0.5 MG tablet Take 1 tablet (0.5 mg total) by mouth 2 (two) times daily as needed for anxiety. 30 tablet 2   metoprolol tartrate  (LOPRESSOR) 25 MG tablet Take 25 mg by mouth 2 (two) times daily.     pantoprazole (PROTONIX) 40 MG tablet Take 40 mg by mouth every evening.      No current facility-administered medications for this visit.    Allergies:   Latex and Tape    Social History:  The patient  reports that he quit smoking about 32 years ago. He has never used smokeless tobacco. He reports current alcohol use. He reports that he does not use drugs.   Family History:  The patient's family history includes Heart attack in his father; Heart disease in his brother.    ROS:  Please see the history of present illness.   Otherwise, review of systems are positive for none.   All other systems are reviewed and negative.    PHYSICAL EXAM: VS:  There were no vitals taken for this visit. , BMI There is no height or weight on file to calculate BMI. GEN: Well nourished, well developed, in no acute distress  HEENT: normal  Neck: no JVD, carotid bruits, or masses Cardiac: RRR; no murmurs, rubs, or gallops,no edema. Sternal incision is healing well.  Respiratory:  clear to auscultation bilaterally, normal work of breathing GI: soft, nontender, nondistended, + BS MS: no deformity or atrophy  Skin: warm and dry, no rash Neuro:  Strength and sensation are intact Psych: euthymic mood, full affect   EKG:  EKG is not ordered today.   Recent Labs: 06/07/2021: ALT 14; BUN 17; Creatinine, Ser 1.10; Hemoglobin 13.0; Magnesium 2.0; Platelets 191; Potassium 3.7; Sodium 136    Lipid Panel    Component Value Date/Time   CHOL 120 11/21/2019 0953   TRIG 112 11/21/2019 0953   HDL 44 11/21/2019 0953   CHOLHDL 2.7 11/21/2019 0953   CHOLHDL 3.4 03/30/2019 0212   VLDL 35 03/30/2019 0212   LDLCALC 56 11/21/2019 0953    Dated 01/25/19: cholesterol 117, triglycerides 125, HDL 39, LDL 53. CMET, CBC, TSH normal Dated 04/09/20: cholesterol 123, triglycerides 130, HDL 44, LDL 34. Creatinine 1.17. otherwise CBC, CMET and TSH normal Dated  02/16/21: cholesterol 118, triglycerides 99, HDL 41, LDL 58. Dated 12/02/21: cholesterol 128, triglycerides 195, HDL 39, LDL 57, CBC, CMET and TSH normal  Wt Readings from Last 3 Encounters:  08/10/21 224 lb (101.6 kg)  06/07/21 234 lb 9.6 oz (106.4 kg)  01/18/21 231 lb (104.8 kg)      Other studies Reviewed:  Additional studies/ records that were reviewed today include:   Cath 03/26/19: Procedures  LEFT HEART CATH AND CORONARY ANGIOGRAPHY  Conclusion    Prox RCA lesion is 45% stenosed. 2nd RPL lesion is 80% stenosed. Ost LM to Mid LM lesion is 50% stenosed. Ost Cx to Mid Cx lesion is 80% stenosed. Ramus lesion is 90% stenosed. Mid LAD lesion is 70% stenosed. 2nd Diag lesion is 60% stenosed. The left ventricular systolic function is normal. LV end diastolic pressure is normal. The left ventricular ejection fraction is 55-65% by visual estimate.   1. Three vessel obstructive, Calcific CAD 2. Normal LV function 3. Normal LVEDP   Plan: recommend referral to CT surgery for CABG. Will stop Plavix.     Echo 03/30/19: IMPRESSIONS      1. Left ventricular ejection fraction, by visual estimation, is 55 to 60%. The left ventricle has normal function. There is no left ventricular hypertrophy.  2. Global right ventricle has mildly reduced systolic function.The right ventricular size is mildly enlarged. No increase in right ventricular wall thickness.  3. Left atrial size was normal.  4. Right atrial size was mildly dilated.  5. The mitral valve is normal in structure. No evidence of mitral valve regurgitation.  6. The tricuspid valve is normal in structure. Tricuspid valve regurgitation is trivial.  7. The aortic valve is normal in structure. Aortic valve regurgitation is not visualized. No evidence of aortic valve sclerosis or stenosis.  8. The pulmonic valve was grossly normal. Pulmonic valve regurgitation is not visualized.  9. Aortic dilatation noted. 10. There is mild  dilatation of the ascending aorta measuring 38 mm. 11. Normal pulmonary artery systolic pressure. 12. The atrial septum is grossly normal.   Cardiac cath 12/21/20:  LEFT HEART CATH AND CORS/GRAFTS ANGIOGRAPHY   Conclusion      Ost Cx to Mid Cx lesion is 80% stenosed.   Prox RCA lesion is 45% stenosed.   Ramus lesion is 90% stenosed.   2nd RPL lesion is 80% stenosed.   Mid LAD lesion is 65% stenosed.   Ost LM to Mid LM lesion is 40% stenosed.   2nd Diag lesion is 90% stenosed.   LIMA graft was visualized by angiography and is normal in caliber.   SVG graft was visualized by angiography and is normal in caliber.   SVG graft was visualized by angiography and is normal in caliber.   The graft exhibits no disease.   The graft exhibits no disease.   The graft exhibits no disease.   LV end diastolic pressure is normal.   3 vessel obstructive CAD Patent LIMA - this is tied to the second diagonal and not the LAD Patent SVG to the ramus intermediate. No visualization of SVG to diagonal which per op note came off the hood of the SVG to ramus Patent SVG to the PL branch of the RCA Normal LVEDP   Plan: there appears to be good blood flow to all major vessels. The LAD was not revascularized but has borderline disease and the patient had prior Myoview with no ischemia in this territory. Other grafts are patent. Recommend continued medical therapy. Does not appear to be volume overloaded. Echo pending.  Given significant difficulty from the radial approach I would recommend femoral access for any future Cardiac cath procedures.  Coronary Diagrams  Diagnostic Dominance: Right Intervention  Echo 01/08/21: IMPRESSIONS     1. Left ventricular ejection fraction, by estimation, is 50 to 55%. The  left ventricle has  low normal function. The left ventricle demonstrates  regional wall motion abnormalities (see scoring diagram/findings for  description). There is moderate left  ventricular  hypertrophy. Left ventricular diastolic parameters are  consistent with Grade I diastolic dysfunction (impaired relaxation). There  is moderate hypokinesis of the left ventricular, basal-mid septal wall.   2. Right ventricular systolic function is moderately reduced. The right  ventricular size is mildly enlarged. There is normal pulmonary artery  systolic pressure. The estimated right ventricular systolic pressure is  40.9 mmHg.   3. Left atrial size was mildly dilated.   4. The mitral valve is abnormal. Trivial mitral valve regurgitation.   5. The aortic valve is tricuspid. Aortic valve regurgitation is not  visualized. Mild to moderate aortic valve sclerosis/calcification is  present, without any evidence of aortic stenosis.   6. The inferior vena cava is normal in size with greater than 50%  respiratory variability, suggesting right atrial pressure of 3 mmHg.   Comparison(s): Changes from prior study are noted. 09/05/2019: LVEF 60-65%,  severe LAE, moderate RAE.   ASSESSMENT AND PLAN:  1. CAD  Now s/p CABG x 4 on 04/02/19: clinically doing very well without significant angina.  Cardiac cath in August 2022 looked good. Continue ASA,  high dose statin and metoprolol.   Atrial fibrillation post op CABG.  Maintaining NSR. Off Eliquis and amiodarone.  3. HTN: Blood pressure is well controlled.    4. HLD: Continue on Lipitor 80 mg daily. LDL at goal     5. History of CVA on ASA and Plavix pre op. Now on ASA  Only.      6. Cervical/lumbar spine disease. Followed by Dr. Ellene Route.      7. Chronic diastolic CHF. Volume status looks good on exam. On lasix 20 mg daily. Continue sodium restriction    Current medicines are reviewed at length with the patient today.  The patient does not have concerns regarding medicines.  The following changes have been made:  See above  Labs/ tests ordered today include:   No orders of the defined types were placed in this encounter.    Disposition:    FU 6 months   Signed, Adalena Abdulla Martin[place], Dunn  02/05/2022 9:54 AM    [facility] [address] [place], [place], [address] Phone [phone], Fax [phone]

## 2022-02-07 ENCOUNTER — Ambulatory Visit: Payer: Medicare Other | Attending: Cardiology | Admitting: Cardiology

## 2022-02-07 ENCOUNTER — Encounter: Payer: Self-pay | Admitting: Cardiology

## 2022-02-07 VITALS — BP 140/90 | HR 70 | Ht 67.0 in | Wt 228.0 lb

## 2022-02-07 DIAGNOSIS — E7800 Pure hypercholesterolemia, unspecified: Secondary | ICD-10-CM

## 2022-02-07 DIAGNOSIS — I25118 Atherosclerotic heart disease of native coronary artery with other forms of angina pectoris: Secondary | ICD-10-CM | POA: Diagnosis not present

## 2022-02-07 DIAGNOSIS — I10 Essential (primary) hypertension: Secondary | ICD-10-CM

## 2022-02-07 DIAGNOSIS — Z951 Presence of aortocoronary bypass graft: Secondary | ICD-10-CM

## 2022-02-07 DIAGNOSIS — I5032 Chronic diastolic (congestive) heart failure: Secondary | ICD-10-CM

## 2022-02-07 MED ORDER — NITROGLYCERIN 0.4 MG SL SUBL: 0.4000 mg | SUBLINGUAL_TABLET | SUBLINGUAL | 3 refills | Status: AC | PRN

## 2022-02-08 DIAGNOSIS — K219 Gastro-esophageal reflux disease without esophagitis: Secondary | ICD-10-CM | POA: Diagnosis not present

## 2022-02-08 DIAGNOSIS — E785 Hyperlipidemia, unspecified: Secondary | ICD-10-CM | POA: Diagnosis not present

## 2022-02-08 DIAGNOSIS — I10 Essential (primary) hypertension: Secondary | ICD-10-CM | POA: Diagnosis not present

## 2022-02-08 DIAGNOSIS — J449 Chronic obstructive pulmonary disease, unspecified: Secondary | ICD-10-CM | POA: Diagnosis not present

## 2022-04-07 DIAGNOSIS — M5116 Intervertebral disc disorders with radiculopathy, lumbar region: Secondary | ICD-10-CM | POA: Diagnosis not present

## 2022-04-07 DIAGNOSIS — M5416 Radiculopathy, lumbar region: Secondary | ICD-10-CM | POA: Diagnosis not present

## 2022-04-26 DIAGNOSIS — I5032 Chronic diastolic (congestive) heart failure: Secondary | ICD-10-CM | POA: Diagnosis not present

## 2022-04-26 DIAGNOSIS — I10 Essential (primary) hypertension: Secondary | ICD-10-CM | POA: Diagnosis not present

## 2022-04-26 DIAGNOSIS — J449 Chronic obstructive pulmonary disease, unspecified: Secondary | ICD-10-CM | POA: Diagnosis not present

## 2022-04-26 DIAGNOSIS — E785 Hyperlipidemia, unspecified: Secondary | ICD-10-CM | POA: Diagnosis not present

## 2022-06-03 ENCOUNTER — Ambulatory Visit (HOSPITAL_COMMUNITY)
Admission: EM | Disposition: A | Discharge status: Home / Self Care | Payer: Self-pay | Source: Home / Self Care | Attending: Emergency Medicine

## 2022-06-03 ENCOUNTER — Emergency Department (HOSPITAL_COMMUNITY): Payer: Medicare Other

## 2022-06-03 ENCOUNTER — Observation Stay (HOSPITAL_COMMUNITY)
Admission: EM | Admit: 2022-06-03 | Discharge: 2022-06-08 | Disposition: A | Discharge status: Home / Self Care | Payer: Medicare Other | Attending: Cardiovascular Disease | Admitting: Cardiovascular Disease

## 2022-06-03 ENCOUNTER — Other Ambulatory Visit: Payer: Self-pay

## 2022-06-03 ENCOUNTER — Encounter (HOSPITAL_COMMUNITY): Payer: Self-pay | Admitting: Cardiovascular Disease

## 2022-06-03 DIAGNOSIS — Z96653 Presence of artificial knee joint, bilateral: Secondary | ICD-10-CM | POA: Diagnosis not present

## 2022-06-03 DIAGNOSIS — E785 Hyperlipidemia, unspecified: Secondary | ICD-10-CM | POA: Diagnosis not present

## 2022-06-03 DIAGNOSIS — Z79899 Other long term (current) drug therapy: Secondary | ICD-10-CM | POA: Diagnosis not present

## 2022-06-03 DIAGNOSIS — I480 Paroxysmal atrial fibrillation: Secondary | ICD-10-CM | POA: Diagnosis not present

## 2022-06-03 DIAGNOSIS — Z951 Presence of aortocoronary bypass graft: Secondary | ICD-10-CM | POA: Diagnosis not present

## 2022-06-03 DIAGNOSIS — R110 Nausea: Secondary | ICD-10-CM | POA: Diagnosis not present

## 2022-06-03 DIAGNOSIS — Z87891 Personal history of nicotine dependence: Secondary | ICD-10-CM | POA: Insufficient documentation

## 2022-06-03 DIAGNOSIS — Z8673 Personal history of transient ischemic attack (TIA), and cerebral infarction without residual deficits: Secondary | ICD-10-CM | POA: Insufficient documentation

## 2022-06-03 DIAGNOSIS — N1831 Chronic kidney disease, stage 3a: Secondary | ICD-10-CM | POA: Insufficient documentation

## 2022-06-03 DIAGNOSIS — R079 Chest pain, unspecified: Secondary | ICD-10-CM | POA: Diagnosis not present

## 2022-06-03 DIAGNOSIS — I2510 Atherosclerotic heart disease of native coronary artery without angina pectoris: Secondary | ICD-10-CM | POA: Diagnosis present

## 2022-06-03 DIAGNOSIS — I5032 Chronic diastolic (congestive) heart failure: Secondary | ICD-10-CM | POA: Diagnosis not present

## 2022-06-03 DIAGNOSIS — R0789 Other chest pain: Secondary | ICD-10-CM | POA: Diagnosis not present

## 2022-06-03 DIAGNOSIS — I4891 Unspecified atrial fibrillation: Secondary | ICD-10-CM | POA: Diagnosis present

## 2022-06-03 DIAGNOSIS — I200 Unstable angina: Secondary | ICD-10-CM | POA: Diagnosis present

## 2022-06-03 DIAGNOSIS — I10 Essential (primary) hypertension: Secondary | ICD-10-CM | POA: Diagnosis not present

## 2022-06-03 DIAGNOSIS — Z7982 Long term (current) use of aspirin: Secondary | ICD-10-CM | POA: Insufficient documentation

## 2022-06-03 DIAGNOSIS — I2089 Other forms of angina pectoris: Secondary | ICD-10-CM | POA: Diagnosis present

## 2022-06-03 DIAGNOSIS — I25110 Atherosclerotic heart disease of native coronary artery with unstable angina pectoris: Principal | ICD-10-CM | POA: Insufficient documentation

## 2022-06-03 DIAGNOSIS — I130 Hypertensive heart and chronic kidney disease with heart failure and stage 1 through stage 4 chronic kidney disease, or unspecified chronic kidney disease: Secondary | ICD-10-CM | POA: Diagnosis not present

## 2022-06-03 DIAGNOSIS — Z96611 Presence of right artificial shoulder joint: Secondary | ICD-10-CM | POA: Diagnosis not present

## 2022-06-03 DIAGNOSIS — D649 Anemia, unspecified: Secondary | ICD-10-CM | POA: Insufficient documentation

## 2022-06-03 DIAGNOSIS — R42 Dizziness and giddiness: Secondary | ICD-10-CM | POA: Diagnosis not present

## 2022-06-03 DIAGNOSIS — R0602 Shortness of breath: Secondary | ICD-10-CM | POA: Diagnosis not present

## 2022-06-03 DIAGNOSIS — Z85528 Personal history of other malignant neoplasm of kidney: Secondary | ICD-10-CM | POA: Insufficient documentation

## 2022-06-03 DIAGNOSIS — Z91040 Latex allergy status: Secondary | ICD-10-CM | POA: Insufficient documentation

## 2022-06-03 DIAGNOSIS — R1900 Intra-abdominal and pelvic swelling, mass and lump, unspecified site: Secondary | ICD-10-CM | POA: Insufficient documentation

## 2022-06-03 DIAGNOSIS — R911 Solitary pulmonary nodule: Secondary | ICD-10-CM | POA: Diagnosis not present

## 2022-06-03 DIAGNOSIS — I25700 Atherosclerosis of coronary artery bypass graft(s), unspecified, with unstable angina pectoris: Secondary | ICD-10-CM

## 2022-06-03 DIAGNOSIS — I712 Thoracic aortic aneurysm, without rupture, unspecified: Secondary | ICD-10-CM | POA: Insufficient documentation

## 2022-06-03 DIAGNOSIS — R9431 Abnormal electrocardiogram [ECG] [EKG]: Secondary | ICD-10-CM | POA: Diagnosis not present

## 2022-06-03 HISTORY — PX: LEFT HEART CATH AND CORS/GRAFTS ANGIOGRAPHY: CATH118250

## 2022-06-03 LAB — BASIC METABOLIC PANEL
Anion gap: 10 (ref 5–15)
BUN: 18 mg/dL (ref 8–23)
CO2: 21 mmol/L — ABNORMAL LOW (ref 22–32)
Calcium: 9 mg/dL (ref 8.9–10.3)
Chloride: 106 mmol/L (ref 98–111)
Creatinine, Ser: 1.23 mg/dL (ref 0.61–1.24)
GFR, Estimated: 60 mL/min — ABNORMAL LOW (ref >60)
Glucose, Bld: 111 mg/dL — ABNORMAL HIGH (ref 70–99)
Potassium: 4 mmol/L (ref 3.5–5.1)
Sodium: 137 mmol/L (ref 135–145)

## 2022-06-03 LAB — TROPONIN I (HIGH SENSITIVITY)
Troponin I (High Sensitivity): 13 ng/L (ref <18)
Troponin I (High Sensitivity): 13 ng/L (ref <18)

## 2022-06-03 LAB — CBC
HCT: 38.9 % — ABNORMAL LOW (ref 39.0–52.0)
Hemoglobin: 13.2 g/dL (ref 13.0–17.0)
MCH: 32.5 pg (ref 26.0–34.0)
MCHC: 33.9 g/dL (ref 30.0–36.0)
MCV: 95.8 fL (ref 80.0–100.0)
Platelets: 240 10*3/uL (ref 150–400)
RBC: 4.06 MIL/uL — ABNORMAL LOW (ref 4.22–5.81)
RDW: 14.6 % (ref 11.5–15.5)
WBC: 7.8 10*3/uL (ref 4.0–10.5)
nRBC: 0 % (ref 0.0–0.2)

## 2022-06-03 SURGERY — LEFT HEART CATH AND CORS/GRAFTS ANGIOGRAPHY
Anesthesia: LOCAL

## 2022-06-03 MED ORDER — HEPARIN (PORCINE) IN NACL 1000-0.9 UT/500ML-% IV SOLN
INTRAVENOUS | Status: AC
  Filled 2022-06-03: qty 1000

## 2022-06-03 MED ORDER — LABETALOL HCL 5 MG/ML IV SOLN: 10.0000 mg | INTRAVENOUS | Status: AC | PRN

## 2022-06-03 MED ORDER — LIDOCAINE HCL (PF) 1 % IJ SOLN
INTRAMUSCULAR | Status: DC | PRN
  Administered 2022-06-03 (×2): 5 mL via INTRADERMAL

## 2022-06-03 MED ORDER — IOHEXOL 350 MG/ML SOLN
INTRAVENOUS | Status: DC | PRN
  Administered 2022-06-03: 90 mL

## 2022-06-03 MED ORDER — ASPIRIN 81 MG PO TBEC: 81.0000 mg | DELAYED_RELEASE_TABLET | Freq: Every day | ORAL | Status: DC

## 2022-06-03 MED ORDER — FENTANYL CITRATE (PF) 100 MCG/2ML IJ SOLN
INTRAMUSCULAR | Status: DC | PRN
  Administered 2022-06-03: 25 ug via INTRAVENOUS

## 2022-06-03 MED ORDER — LORAZEPAM 0.5 MG PO TABS
0.5000 mg | ORAL_TABLET | Freq: Two times a day (BID) | ORAL | Status: DC | PRN
  Administered 2022-06-06: 0.5 mg via ORAL
  Filled 2022-06-03: qty 1

## 2022-06-03 MED ORDER — ALLOPURINOL 300 MG PO TABS: 300.0000 mg | ORAL_TABLET | Freq: Every day | ORAL | Status: DC | PRN

## 2022-06-03 MED ORDER — SODIUM CHLORIDE 0.9% FLUSH
3.0000 mL | Freq: Two times a day (BID) | INTRAVENOUS | Status: DC
  Administered 2022-06-03 – 2022-06-07 (×9): 3 mL via INTRAVENOUS

## 2022-06-03 MED ORDER — SODIUM CHLORIDE 0.9 % WEIGHT BASED INFUSION: 1.0000 mL/kg/h | INTRAVENOUS | Status: DC

## 2022-06-03 MED ORDER — ORAL CARE MOUTH RINSE: 15.0000 mL | OROMUCOSAL | Status: DC | PRN

## 2022-06-03 MED ORDER — VERAPAMIL HCL 2.5 MG/ML IV SOLN
INTRAVENOUS | Status: AC
  Filled 2022-06-03: qty 2

## 2022-06-03 MED ORDER — MIDAZOLAM HCL 2 MG/2ML IJ SOLN
INTRAMUSCULAR | Status: AC
  Filled 2022-06-03: qty 2

## 2022-06-03 MED ORDER — FENTANYL CITRATE (PF) 100 MCG/2ML IJ SOLN
INTRAMUSCULAR | Status: AC
  Filled 2022-06-03: qty 2

## 2022-06-03 MED ORDER — HYDRALAZINE HCL 20 MG/ML IJ SOLN: 10.0000 mg | INTRAMUSCULAR | Status: AC | PRN

## 2022-06-03 MED ORDER — SODIUM CHLORIDE 0.9% FLUSH: 3.0000 mL | INTRAVENOUS | Status: DC | PRN

## 2022-06-03 MED ORDER — HYDROCODONE-ACETAMINOPHEN 5-325 MG PO TABS
1.0000 | ORAL_TABLET | Freq: Three times a day (TID) | ORAL | Status: DC | PRN
  Administered 2022-06-04 – 2022-06-08 (×7): 1 via ORAL
  Filled 2022-06-03 (×7): qty 1

## 2022-06-03 MED ORDER — SODIUM CHLORIDE 0.9 % IV SOLN: 250.0000 mL | INTRAVENOUS | Status: DC | PRN

## 2022-06-03 MED ORDER — ONDANSETRON HCL 4 MG/2ML IJ SOLN
4.0000 mg | Freq: Four times a day (QID) | INTRAMUSCULAR | Status: DC | PRN
  Administered 2022-06-03 – 2022-06-04 (×2): 4 mg via INTRAVENOUS
  Filled 2022-06-03 (×2): qty 2

## 2022-06-03 MED ORDER — ASPIRIN 81 MG PO CHEW: 81.0000 mg | CHEWABLE_TABLET | ORAL | Status: DC

## 2022-06-03 MED ORDER — HEPARIN (PORCINE) IN NACL 1000-0.9 UT/500ML-% IV SOLN
INTRAVENOUS | Status: DC | PRN
  Administered 2022-06-03 (×2): 500 mL

## 2022-06-03 MED ORDER — METOPROLOL TARTRATE 25 MG PO TABS
25.0000 mg | ORAL_TABLET | Freq: Two times a day (BID) | ORAL | Status: DC
  Administered 2022-06-04 – 2022-06-08 (×9): 25 mg via ORAL
  Filled 2022-06-03 (×10): qty 1

## 2022-06-03 MED ORDER — ATORVASTATIN CALCIUM 80 MG PO TABS
80.0000 mg | ORAL_TABLET | Freq: Every day | ORAL | Status: DC
  Administered 2022-06-04 – 2022-06-08 (×5): 80 mg via ORAL
  Filled 2022-06-03 (×6): qty 1

## 2022-06-03 MED ORDER — PANTOPRAZOLE SODIUM 40 MG PO TBEC
40.0000 mg | DELAYED_RELEASE_TABLET | Freq: Every evening | ORAL | Status: DC
  Administered 2022-06-03 – 2022-06-07 (×5): 40 mg via ORAL
  Filled 2022-06-03 (×5): qty 1

## 2022-06-03 MED ORDER — APIXABAN 5 MG PO TABS
5.0000 mg | ORAL_TABLET | Freq: Two times a day (BID) | ORAL | Status: DC
  Administered 2022-06-04 – 2022-06-06 (×6): 5 mg via ORAL
  Filled 2022-06-03 (×6): qty 1

## 2022-06-03 MED ORDER — ACETAMINOPHEN 325 MG PO TABS: 650.0000 mg | ORAL_TABLET | ORAL | Status: DC | PRN

## 2022-06-03 MED ORDER — MIDAZOLAM HCL 2 MG/2ML IJ SOLN
INTRAMUSCULAR | Status: DC | PRN
  Administered 2022-06-03: 2 mg via INTRAVENOUS

## 2022-06-03 MED ORDER — SODIUM CHLORIDE 0.9 % WEIGHT BASED INFUSION: 3.0000 mL/kg/h | INTRAVENOUS | Status: DC

## 2022-06-03 MED ORDER — SODIUM CHLORIDE 0.9 % IV SOLN: INTRAVENOUS | Status: AC

## 2022-06-03 MED ORDER — LIDOCAINE HCL (PF) 1 % IJ SOLN
INTRAMUSCULAR | Status: AC
  Filled 2022-06-03: qty 30

## 2022-06-03 SURGICAL SUPPLY — 17 items
CATH INFINITI 5FR MULTPACK ANG (CATHETERS) IMPLANT
CATH LAUNCHER 5F JL5 (CATHETERS) IMPLANT
CATHETER LAUNCHER 5F JL5 (CATHETERS) ×1
CLOSURE MYNX CONTROL 5F (Vascular Products) IMPLANT
ELECT DEFIB PAD ADLT CADENCE (PAD) IMPLANT
GLIDESHEATH SLEND SS 6F .021 (SHEATH) IMPLANT
GUIDEWIRE INQWIRE 1.5J.035X260 (WIRE) IMPLANT
INQWIRE 1.5J .035X260CM (WIRE) ×1
KIT HEART LEFT (KITS) ×1 IMPLANT
KIT MICROPUNCTURE NIT STIFF (SHEATH) IMPLANT
MAT PREVALON FULL STRYKER (MISCELLANEOUS) IMPLANT
PACK CARDIAC CATHETERIZATION (CUSTOM PROCEDURE TRAY) ×1 IMPLANT
SHEATH PINNACLE 5F 10CM (SHEATH) IMPLANT
SHEATH PROBE COVER 6X72 (BAG) IMPLANT
TRANSDUCER W/STOPCOCK (MISCELLANEOUS) ×1 IMPLANT
TUBING CIL FLEX 10 FLL-RA (TUBING) ×1 IMPLANT
WIRE EMERALD 3MM-J .035X150CM (WIRE) IMPLANT

## 2022-06-03 NOTE — Interval H&P Note (Signed)
History and Physical Interval Note:  06/03/2022 2:59 PM  Jake Dunn  has presented today for surgery, with the diagnosis of unstable angina.  The various methods of treatment have been discussed with the patient and family. After consideration of risks, benefits and other options for treatment, the patient has consented to  Procedure(s): LEFT HEART CATH AND CORONARY ANGIOGRAPHY (N/A) as a surgical intervention.  The patient's history has been reviewed, patient examined, no change in status, stable for surgery.  I have reviewed the patient's chart and labs.  Questions were answered to the patient's satisfaction.     [name]

## 2022-06-03 NOTE — ED Triage Notes (Signed)
BIBEMS from doctors office. Chest pain for 1 week, intermittent, has progressively become worse. Left chest pain with pressure. Shortness of breath with exertion. Pt reports nausea and dizziness with changes in position. Given '324mg'$  of aspirin, '4mg'$  of zofran, and 4x nitro by EMS. Pain down to 4/10 after nitro. Hx of A. Fib, bypass 3 years ago.

## 2022-06-03 NOTE — ED Provider Notes (Signed)
[facility] Provider Note   CSN: [id number] Arrival date & time: 06/03/22  1048     History  Chief Complaint  Patient presents with   Chest Pain   Shortness of Breath    Jake Dunn is a 82 y.o. male.  Patient presents emerged part via EMS from [place] office.  Patient reports 1 week of intermittent chest pain which has progressively become worse.  He describes left-sided pain that feels like a squeezing pressure.  He states pain has been as high as 10 out of 10 in severity at this morning.  EMS administered 4 doses of nitroglycerin, 324 mg of aspirin, and 4 mg of Zofran during transport.  Patient states his pain was reduced to 4 of 10 severity after the nitroglycerin.  Patient is also complaining of shortness of breath with exertion and generalized weakness.  He reports nausea, worse with positional changes.  He does have a history of atrial fibrillation with coronary bypass surgery 3 years ago.  Past medical history otherwise significant for hypertension, GERD, hyperlipidemia, anxiety, chronic back pain, history of stroke, renal cell carcinoma  HPI     Home Medications Prior to Admission medications   Medication Sig Start Date End Date Taking? Authorizing Provider  acyclovir (ZOVIRAX) 400 MG tablet Take 400 mg by mouth 3 (three) times daily as needed (For cold sores). . 10/05/16   [provider]  allopurinol (ZYLOPRIM) 300 MG tablet Take 300 mg by mouth daily as needed (for gout flares).     [provider]  aspirin 81 MG EC tablet Take 1 tablet (81 mg total) by mouth daily. Swallow whole. 08/10/21   Martinique, Peter M, MD  atorvastatin (LIPITOR) 80 MG tablet Take 1 tablet (80 mg total) by mouth daily. 08/10/21 08/05/22  Martinique, Peter M, MD  Fluticasone-Umeclidin-Vilant (TRELEGY ELLIPTA) 100-62.5-25 MCG/INH AEPB Inhale 1 puff into the lungs daily. Patient taking differently: Inhale 1 puff into the lungs daily as needed (for  shortness of breath). 09/14/20   Laurin Coder, MD  furosemide (LASIX) 20 MG tablet TAKE ONE TABLET BY MOUTH DAILY 12/10/21   Martinique, Peter M, MD  HYDROcodone-acetaminophen (NORCO/VICODIN) 5-325 MG tablet Take 1 tablet by mouth 3 (three) times daily as needed for moderate pain. 06/11/21   Dwyane Dee, MD  LORazepam (ATIVAN) 0.5 MG tablet Take 1 tablet (0.5 mg total) by mouth 2 (two) times daily as needed for anxiety. 06/11/21   Dwyane Dee, MD  metoprolol tartrate (LOPRESSOR) 25 MG tablet Take 25 mg by mouth 2 (two) times daily.    [provider]  nitroGLYCERIN (NITROSTAT) 0.4 MG SL tablet Place 1 tablet (0.4 mg total) under the tongue every 5 (five) minutes as needed for chest pain. 02/07/22 05/08/22  Martinique, Peter M, MD  pantoprazole (PROTONIX) 40 MG tablet Take 40 mg by mouth every evening.     [provider]      Allergies    Latex and Tape    Review of Systems   Review of Systems  Respiratory:  Positive for shortness of breath.   Cardiovascular:  Positive for chest pain. Negative for palpitations and leg swelling.  Gastrointestinal:  Positive for nausea. Negative for abdominal pain and vomiting.  Genitourinary:  Negative for dysuria.    Physical Exam Updated Vital Signs BP 129/82   Pulse 70   Temp 98.8 F (37.1 C) (Oral)   Resp 12   SpO2 98%  Physical Exam Vitals and  nursing note reviewed.  Constitutional:      General: He is not in acute distress.    Appearance: He is well-developed.  HENT:     Head: Normocephalic and atraumatic.  Eyes:     Conjunctiva/sclera: Conjunctivae normal.  Cardiovascular:     Rate and Rhythm: Normal rate and regular rhythm.     Heart sounds: No murmur heard. Pulmonary:     Effort: Pulmonary effort is normal. No respiratory distress.     Breath sounds: Normal breath sounds.  Abdominal:     Palpations: Abdomen is soft.     Tenderness: There is no abdominal tenderness.  Musculoskeletal:        General: No swelling.      Cervical back: Neck supple.  Skin:    General: Skin is warm and dry.     Capillary Refill: Capillary refill takes less than 2 seconds.  Neurological:     Mental Status: He is alert.  Psychiatric:        Mood and Affect: Mood normal.     ED Results / Procedures / Treatments   Labs (all labs ordered are listed, but only abnormal results are displayed) Labs Reviewed  BASIC METABOLIC PANEL - Abnormal; Notable for the following components:      Result Value   CO2 21 (*)    Glucose, Bld 111 (*)    GFR, Estimated 60 (*)    All other components within normal limits  CBC - Abnormal; Notable for the following components:   RBC 4.06 (*)    HCT 38.9 (*)    All other components within normal limits  TROPONIN I (HIGH SENSITIVITY)  TROPONIN I (HIGH SENSITIVITY)    EKG EKG Interpretation  Date/Time:  Friday June 03 2022 10:55:47 EST Ventricular Rate:  76 PR Interval:    QRS Duration: 100 QT Interval:  401 QTC Calculation: 451 R Axis:   45 Text Interpretation: Atrial fibrillation Non-specific ST-t changes Confirmed by Lajean Saver [phone]) on 06/03/2022 11:14:43 AM  Radiology DG Chest 2 View  Result Date: 06/03/2022 CLINICAL DATA:  Chest pain, shortness of breath. EXAM: CHEST - 2 VIEW COMPARISON:  06/08/2021. FINDINGS: Intact median sternotomy wires. Normal heart size. Stable mediastinal with postoperative changes of median sternotomy and CABG. Clear lungs. No pleural effusion or pneumothorax. Reverse right shoulder arthroplasty. Surgical clips project over the upper abdomen. IMPRESSION: No evidence of acute cardiopulmonary disease. Electronically Signed   By: Emmit Alexanders M.D.   On: 06/03/2022 11:42    Procedures Procedures    Medications Ordered in ED Medications  aspirin chewable tablet 81 mg (has no administration in time range)  0.9% sodium chloride infusion (has no administration in time range)    Followed by  0.9% sodium chloride infusion (has no administration in  time range)  fentaNYL (SUBLIMAZE) injection (25 mcg Intravenous Given 06/03/22 1516)  midazolam (VERSED) injection (2 mg Intravenous Given 06/03/22 1516)  lidocaine (PF) (XYLOCAINE) 1 % injection (5 mLs Intradermal Given 06/03/22 1523)  Heparin (Porcine) in NaCl 1000-0.9 UT/500ML-% SOLN (500 mLs  Given 06/03/22 1551)  iohexol (OMNIPAQUE) 350 MG/ML injection (90 mLs  Given 06/03/22 1551)    ED Course/ Medical Decision Making/ A&P             HEART Score: 6                Medical Decision Making Amount and/or Complexity of Data Reviewed Labs: ordered. Radiology: ordered.   This patient presents to the ED for  concern of chest pain, this involves an extensive number of treatment options, and is a complaint that carries with it a high risk of complications and morbidity.  The differential diagnosis includes ACS, pulmonary embolism, pneumonia, and others   Co morbidities that complicate the patient evaluation  History of previous CABG   Additional history obtained:  Additional history obtained from EMS External records from outside source obtained and reviewed including cardiology notes from October of this year   Lab Tests:  I Ordered, and personally interpreted labs.  The pertinent results include: Grossly unremarkable BMP, CBC, troponin   Imaging Studies ordered:  I ordered imaging studies including chest x-ray I independently visualized and interpreted imaging which showed no acute abnormality I agree with the radiologist interpretation   Cardiac Monitoring: / EKG:  The patient was maintained on a cardiac monitor.  I personally viewed and interpreted the cardiac monitored which showed an underlying rhythm of: Atrial fibrillation   Consultations Obtained:  I requested consultation with the cardiologist, Dr. Johney Frame, and discussed lab and imaging findings as well as pertinent plan - they recommend: Seeing the patient in consult   Test / Admission -  Considered:  Cardiology evaluated patient and took patient to Cath Lab for catheterization        Final Clinical Impression(s) / ED Diagnoses Final diagnoses:  Chest pain, unspecified type    Rx / DC Orders ED Discharge Orders     None         Ronny Bacon 06/03/22 [place], [place], MD 06/05/22 [phone]

## 2022-06-03 NOTE — Consult Note (Addendum)
Cardiology Consultation   Patient ID: HIPOLITO Dunn MRN: [id number]; DOB: 09-Nov-1940  Admit date: 06/03/2022 Date of Consult: 06/03/2022  PCP:  Harlan Stains, MD   [place] Prov[facility] Cardiologist:  Peter Martinique, MD        Patient Profile:   Jake Dunn is a 82 y.o. male with a hx of CAD s/p CABG, GERD, HTN, HLD, chronic diastolic CHF, CVA, renal cell carcinoma s/p left nephrectomy, spinal stenosis s/p multiple operations who is being seen 06/03/2022 for the evaluation of chest pain and atrial fibrillation at the request of Dr. Kathrynn Humble.  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  This pain was associated with shortness of breath, nausea, and diaphoresis.  Patient says that pain on Monday was actually more severe than what he experienced back then.  Patient took a couple of nitro and pain improved to 4 out of 10.  He admits to minimizing symptoms but says that pain has persisted since Monday, fluctuating between a 2 or 3 out of 10 with increases up to 7 out of 10.  Patient reports about 8 of these more severe pain episodes.  He has taken nitro a few additional times and reported that this has helped to some degree with the pain.  Patient denies palpitations.  Exertional dyspnea has continued throughout this week and patient says that although he was able to complete tasks at work yesterday, they took him about twice as long as they normally do because of the need to take frequent breaks.  He denies recent illness, fevers, chills.  He says that shortness of breath was exertional only, no orthopnea.  Patient's cardiac history is notable for symptoms of chest pain and dyspnea. This led to a cardiac cath showing 3 vessel obstructive CAD in November  2020. He underwent CABG on 04/02/19 by Dr Kipp Brood. CABG X 4. LIMA LAD, RSVG PLV, Ramus, and D2 (T graft off the ramus vein graft). Post op course complicated by atrial fibrillation. He was loaded with amiodarone. On his visit in January 2021 amiodarone and Eliquis were discontinued.   Past Medical History:  Diagnosis Date   Adenomatous polyp    Anxiety    Arthritis    "knees, ankles, shoulders" (08/15/2016)   CAD (coronary artery disease)    s/p cath in 2014 showing significant stenosis in the diagonal side branches and in the RV marginal branch. These vessels were small and diffusely diseased. He is managed medically. 08/16/16 Cath, no disease progression   Chronic lower back pain    Disc disease, degenerative, cervical    GERD (gastroesophageal reflux disease)    Gout    Hyperlipidemia    Hypertension    Osteoarthritis    Renal cell carcinoma    left kidney removal   Stroke [facility])    MINI STROKE 15+ YRS AGO, no residual   Stroke ([place])    "I've had 2 or 3"; denies residual on 08/15/2016   Syncope and collapse     Past Surgical History:  Procedure Laterality Date   ANTERIOR CERVICAL DECOMP/DISCECTOMY FUSION  02/2004; 04/2005   [name] 09/20/2010; [name] 08/15/8117   APPLICATION OF ROBOTIC ASSISTANCE FOR SPINAL PROCEDURE N/A 01/02/2018   Procedure: APPLICATION OF ROBOTIC ASSISTANCE FOR SPINAL PROCEDURE;  Surgeon: Kristeen Miss, MD;  Location: [place];  Service: Neurosurgery;  Laterality: N/A;   BACK SURGERY     CARDIAC CATHETERIZATION  12/14/2008   ef 50-55%. SHOWED SIGNIFICANT STENOSIS AND TO DIAGONAL SIDE BRANCHES AND IN THE RIGHT VENTRICULAR MARGINAL BRANCH. THESE VESSELS WERE SMALL AND DIFFUSELY DISEASED   CARDIOVASCULAR STRESS TEST  12/10/2009   EF 61%. EKG negative. Mild peri ischemia. Managed medically.    CARPAL TUNNEL RELEASE Right 11/2005   [name] 09/20/2010   CARPAL TUNNEL RELEASE Left 06/2007   [name] 5/132012   COLONOSCOPY  11/2004   [name] 09/20/2010   COLONOSCOPY W/ BIOPSIES  AND POLYPECTOMY  09/2002   [name] 09/20/2010   CORONARY ARTERY BYPASS GRAFT N/A 04/02/2019   Procedure: CORONARY ARTERY BYPASS GRAFTING (CABG) using LIMA to LAD; Endoscopically harvested right greater saphenous vein to the PLB, Ramus, and Diag 2.;  Surgeon: Lajuana Matte, MD;  Location: [place];  Service: Open Heart Surgery;  Laterality: N/A;   INCISION AND DRAINAGE ABSCESS Right 10/18/2016   Procedure: INCISION AND DRAINAGE ABSCESS RIGHT SHOULDER;  Surgeon: [name], MD;  Location: [place];  Service: Orthopedics;  Laterality: Right;   INCISION AND DRAINAGE OF WOUND Right 08/2005   shoulder/notes 09/20/2010   IR FLUORO GUIDE CV LINE LEFT  10/19/2016   IR US GUIDE VASC ACCESS LEFT  10/19/2016   IRRIGATION AND DEBRIDEMENT SHOULDER Right 12/22/2016   Procedure: IRRIGATION AND DEBRIDEMENT RIGHT SHOULDER;  Surgeon: [name], MD;  Location: [place];  Service: Orthopedics;  Laterality: Right;   JOINT REPLACEMENT     LEFT HEART CATH AND CORONARY ANGIOGRAPHY N/A 08/16/2016   Procedure: Left Heart Cath and Coronary Angiography;  Surgeon: [name], MD;  Location: [place] CV LAB;  Service: Cardiovascular;  Laterality: N/A;   LEFT HEART CATH AND CORONARY ANGIOGRAPHY N/A 03/26/2019   Procedure: LEFT HEART CATH AND CORONARY ANGIOGRAPHY;  Surgeon: Martinique, Peter M, MD;  Location: [place] CV LAB;  Service: Cardiovascular;  Laterality: N/A;   LEFT HEART CATH AND CORS/GRAFTS ANGIOGRAPHY N/A 12/21/2020   Procedure: LEFT HEART CATH AND CORS/GRAFTS ANGIOGRAPHY;  Surgeon: Martinique, Peter M, MD;  Location: [place] CV LAB;  Service: Cardiovascular;  Laterality: N/A;   LEFT HEART CATHETERIZATION WITH CORONARY ANGIOGRAM N/A 08/09/2012   Procedure: LEFT HEART CATHETERIZATION WITH CORONARY ANGIOGRAM;  Surgeon: Peter M Martinique, MD;  Location: [facility] CATH LAB;  Service: Cardiovascular;  Laterality: N/A;   LUMBAR FUSION     NEPHRECTOMY Left early 2000s   REPLACEMENT TOTAL KNEE Right  08/2008   [name] 09/07/2010   SHOULDER ARTHROSCOPY WITH ROTATOR CUFF REPAIR Right 06/2005   [name] 09/20/2010   TOTAL KNEE ARTHROPLASTY  06/08/2011   Procedure: TOTAL KNEE ARTHROPLASTY;  Surgeon: [name], MD;  Location: [place];  Service: Orthopedics;  Laterality: Left;   TOTAL SHOULDER ARTHROPLASTY Right 04/06/2016   Procedure: RIGHT REVERSE TOTAL SHOULDER ARTHROPLASTY;  Surgeon: [name], MD;  Location: [place];  Service: Orthopedics;  Laterality: Right;   TOTAL SHOULDER REVISION Right 12/22/2016   Procedure: RIGHT SHOULDER GLENOID AND HUMERAL COMPONENT REVISION;  Surgeon: [name], MD;  Location: [place];  Service: Orthopedics;  Laterality: Right;   US ECHOCARDIOGRAPHY  12/15/2008   EF 60-65%   US ECHOCARDIOGRAPHY  09/28/2004   EF 55-60%     Home Medications:  Prior to Admission medications   Medication Sig Start Date End Date Taking? Authorizing Provider  acyclovir (ZOVIRAX) 400 MG tablet Take 400  mg by mouth 3 (three) times daily as needed (For cold sores). . 10/05/16   [provider]  allopurinol (ZYLOPRIM) 300 MG tablet Take 300 mg by mouth daily as needed (for gout flares).     [provider]  aspirin 81 MG EC tablet Take 1 tablet (81 mg total) by mouth daily. Swallow whole. 08/10/21   Martinique, Peter M, MD  atorvastatin (LIPITOR) 80 MG tablet Take 1 tablet (80 mg total) by mouth daily. 08/10/21 08/05/22  Martinique, Peter M, MD  Fluticasone-Umeclidin-Vilant (TRELEGY ELLIPTA) 100-62.5-25 MCG/INH AEPB Inhale 1 puff into the lungs daily. Patient taking differently: Inhale 1 puff into the lungs daily as needed (for shortness of breath). 09/14/20   Laurin Coder, MD  furosemide (LASIX) 20 MG tablet TAKE ONE TABLET BY MOUTH DAILY 12/10/21   Martinique, Peter M, MD  HYDROcodone-acetaminophen (NORCO/VICODIN) 5-325 MG tablet Take 1 tablet by mouth 3 (three) times daily as needed for moderate pain. 06/11/21   Dwyane Dee, MD  LORazepam (ATIVAN) 0.5 MG tablet Take 1 tablet  (0.5 mg total) by mouth 2 (two) times daily as needed for anxiety. 06/11/21   Dwyane Dee, MD  metoprolol tartrate (LOPRESSOR) 25 MG tablet Take 25 mg by mouth 2 (two) times daily.    [provider]  nitroGLYCERIN (NITROSTAT) 0.4 MG SL tablet Place 1 tablet (0.4 mg total) under the tongue every 5 (five) minutes as needed for chest pain. 02/07/22 05/08/22  Martinique, Peter M, MD  pantoprazole (PROTONIX) 40 MG tablet Take 40 mg by mouth every evening.     [provider]    Inpatient Medications: Scheduled Meds:  Continuous Infusions:  PRN Meds:   Allergies:    Allergies  Allergen Reactions   Latex Rash    Other reaction(s): Hives/Skin Rash/Itching   Tape Rash    Paper tape okay Other reaction(s): Hives/Rash/Itching    Social History:   Social History   Socioeconomic History   Marital status: Married    Spouse name: Not on file   Number of children: Not on file   Years of education: Not on file   Highest education level: Not on file  Occupational History   Not on file  Tobacco Use   Smoking status: Former    Types: Cigarettes    Quit date: 05/31/1989    Years since quitting: 33.0   Smokeless tobacco: Never   Tobacco comments:    08/15/2016 "hadn't smoked a carton of cigarettes all my life"  Vaping Use   Vaping Use: Never used  Substance and Sexual Activity   Alcohol use: Yes    Alcohol/week: 0.0 standard drinks of alcohol    Comment: "sometimes daily, sometimes weekly" beer   Drug use: No   Sexual activity: Not Currently  Other Topics Concern   Not on file  Social History Narrative   Not on file   Social Determinants of Health   Financial Resource Strain: Not on file  Food Insecurity: Not on file  Transportation Needs: Not on file  Physical Activity: Not on file  Stress: Not on file  Social Connections: Not on file  Intimate Partner Violence: Not on file    Family History:    Family History  Problem Relation Age of Onset   Heart attack  Father    Heart disease Brother    Allergic rhinitis Neg Hx    Angioedema Neg Hx    Asthma Neg Hx    Atopy Neg Hx    Eczema  Neg Hx    Immunodeficiency Neg Hx    Urticaria Neg Hx      ROS:  Please see the history of present illness.   All other ROS reviewed and negative.     Physical Exam/Data:   Vitals:   06/03/22 1052 06/03/22 1245 06/03/22 1257  BP: 121/78 116/80 116/80  Pulse: 73  74  Resp: '18 15 13  '$ Temp: 98.8 F (37.1 C)    TempSrc: Oral    SpO2: 93%  99%   No intake or output data in the 24 hours ending 06/03/22 1441    02/07/2022    7:56 AM 08/10/2021    7:54 AM 06/07/2021    6:48 PM  Last 3 Weights  Weight (lbs) 228 lb 224 lb 234 lb 9.6 oz  Weight (kg) 103.42 kg 101.606 kg 106.414 kg     There is no height or weight on file to calculate BMI.  General: No acute distress but uncomfortable appearing HEENT: normal Neck: no JVD Vascular: No carotid bruits; Distal pulses 2+ bilaterally Cardiac: Irregularly irregular, no murmurs rubs or gallops. Lungs:  clear to auscultation bilaterally, no wheezing, rhonchi or rales  Abd: soft, nontender, no hepatomegaly  Ext: no edema Musculoskeletal:  No deformities, BUE and BLE strength normal and equal Skin: warm and dry  Neuro:  CNs 2-12 intact, no focal abnormalities noted Psych:  Normal affect   EKG:  The EKG was personally reviewed and demonstrates:   Telemetry:  Telemetry was personally reviewed and demonstrates:    Relevant CV Studies:  01/08/2021 TTE  IMPRESSIONS     1. Left ventricular ejection fraction, by estimation, is 50 to 55%. The  left ventricle has low normal function. The left ventricle demonstrates  regional wall motion abnormalities (see scoring diagram/findings for  description). There is moderate left  ventricular hypertrophy. Left ventricular diastolic parameters are  consistent with Grade I diastolic dysfunction (impaired relaxation). There  is moderate hypokinesis of the left ventricular,  basal-mid septal wall.   2. Right ventricular systolic function is moderately reduced. The right  ventricular size is mildly enlarged. There is normal pulmonary artery  systolic pressure. The estimated right ventricular systolic pressure is  52.7 mmHg.   3. Left atrial size was mildly dilated.   4. The mitral valve is abnormal. Trivial mitral valve regurgitation.   5. The aortic valve is tricuspid. Aortic valve regurgitation is not  visualized. Mild to moderate aortic valve sclerosis/calcification is  present, without any evidence of aortic stenosis.   6. The inferior vena cava is normal in size with greater than 50%  respiratory variability, suggesting right atrial pressure of 3 mmHg.   Comparison(s): Changes from prior study are noted. 09/05/2019: LVEF 60-65%,  severe LAE, moderate RAE.   FINDINGS   Left Ventricle: Left ventricular ejection fraction, by estimation, is 50  to 55%. The left ventricle has low normal function. The left ventricle  demonstrates regional wall motion abnormalities. Moderate hypokinesis of  the left ventricular, basal-mid  septal wall. The left ventricular internal cavity size was normal in size.  There is moderate left ventricular hypertrophy. Left ventricular diastolic  parameters are consistent with Grade I diastolic dysfunction (impaired  relaxation). Indeterminate  filling pressures.   Right Ventricle: The right ventricular size is mildly enlarged. No  increase in right ventricular wall thickness. Right ventricular systolic  function is moderately reduced. There is normal pulmonary artery systolic  pressure. The tricuspid regurgitant  velocity is 2.21 m/s, and with an assumed  right atrial pressure of 3 mmHg,  the estimated right ventricular systolic pressure is 76.2 mmHg.   Left Atrium: Left atrial size was mildly dilated.   Right Atrium: Right atrial size was normal in size.   Pericardium: There is no evidence of pericardial effusion.   Mitral  Valve: The mitral valve is abnormal. Mild to moderate mitral  annular calcification. Trivial mitral valve regurgitation. MV peak  gradient, 2.7 mmHg. The mean mitral valve gradient is 1.0 mmHg.   Tricuspid Valve: The tricuspid valve is grossly normal. Tricuspid valve  regurgitation is mild.   Aortic Valve: The aortic valve is tricuspid. Aortic valve regurgitation is  not visualized. Mild to moderate aortic valve sclerosis/calcification is  present, without any evidence of aortic stenosis.   Pulmonic Valve: The pulmonic valve was normal in structure. Pulmonic valve  regurgitation is not visualized.   Aorta: The aortic root and ascending aorta are structurally normal, with  no evidence of dilitation.   Venous: The inferior vena cava is normal in size with greater than 50%  respiratory variability, suggesting right atrial pressure of 3 mmHg.   IAS/Shunts: No atrial level shunt detected by color flow Doppler.   12/21/2020 LHC     Ost Cx to Mid Cx lesion is 80% stenosed.   Prox RCA lesion is 45% stenosed.   Ramus lesion is 90% stenosed.   2nd RPL lesion is 80% stenosed.   Mid LAD lesion is 65% stenosed.   Ost LM to Mid LM lesion is 40% stenosed.   2nd Diag lesion is 90% stenosed.   LIMA graft was visualized by angiography and is normal in caliber.   SVG graft was visualized by angiography and is normal in caliber.   SVG graft was visualized by angiography and is normal in caliber.   The graft exhibits no disease.   The graft exhibits no disease.   The graft exhibits no disease.   LV end diastolic pressure is normal.   3 vessel obstructive CAD Patent LIMA - this is tied to the second diagonal and not the LAD Patent SVG to the ramus intermediate. No visualization of SVG to diagonal which per op note came off the hood of the SVG to ramus Patent SVG to the PL branch of the RCA Normal LVEDP   Plan: there appears to be good blood flow to all major vessels. The LAD was not  revascularized but has borderline disease and the patient had prior Myoview with no ischemia in this territory. Other grafts are patent. Recommend continued medical therapy. Does not appear to be volume overloaded. Echo pending.  Given significant difficulty from the radial approach I would recommend femoral access for any future Cardiac cath procedures.  Diagnostic Dominance: Right   Laboratory Data:  High Sensitivity Troponin:   Recent Labs  Lab 06/03/22 1103 06/03/22 1259  TROPONINIHS 13 13     Chemistry Recent Labs  Lab 06/03/22 1103  NA 137  K 4.0  CL 106  CO2 21*  GLUCOSE 111*  BUN 18  CREATININE 1.23  CALCIUM 9.0  GFRNONAA 60*  ANIONGAP 10    No results for input(s): "PROT", "ALBUMIN", "AST", "ALT", "ALKPHOS", "BILITOT" in the last 168 hours. Lipids No results for input(s): "CHOL", "TRIG", "HDL", "LABVLDL", "LDLCALC", "CHOLHDL" in the last 168 hours.  Hematology Recent Labs  Lab 06/03/22 1103  WBC 7.8  RBC 4.06*  HGB 13.2  HCT 38.9*  MCV 95.8  MCH 32.5  MCHC 33.9  RDW 14.6  PLT  240   Thyroid No results for input(s): "TSH", "FREET4" in the last 168 hours.  BNPNo results for input(s): "BNP", "PROBNP" in the last 168 hours.  DDimer No results for input(s): "DDIMER" in the last 168 hours.   Radiology/Studies:  DG Chest 2 View  Result Date: 06/03/2022 CLINICAL DATA:  Chest pain, shortness of breath. EXAM: CHEST - 2 VIEW COMPARISON:  06/08/2021. FINDINGS: Intact median sternotomy wires. Normal heart size. Stable mediastinal with postoperative changes of median sternotomy and CABG. Clear lungs. No pleural effusion or pneumothorax. Reverse right shoulder arthroplasty. Surgical clips project over the upper abdomen. IMPRESSION: No evidence of acute cardiopulmonary disease. Electronically Signed   By: Emmit Alexanders M.D.   On: 06/03/2022 11:42     Assessment and Plan:  ROBT OKUDA is a 82 y.o. male with a hx of CAD s/p CABG, GERD, HTN, HLD, chronic diastolic  CHF, CVA, renal cell carcinoma s/p left nephrectomy, spinal stenosis s/p multiple operations who is being seen 06/03/2022 for the evaluation of chest pain and atrial fibrillation at the request of Dr. Kathrynn Humble.  Chest pain Hx CAD  Patient with hx LIMA-LAD, SVG-rPLA, SVG-RI/D2 in 2020 with Dr. Kipp Brood.  Presented to the emergency department today after experiencing nearly 5 days of low-level chest pain with intermittent episodes of severe, 7 out of 10 squeezing chest pain.  More severe chest pain was associated with nausea and diaphoresis.  Patient also admits having significant shortness of breath and decreased exertional tolerance this week. Troponin 15->15. ECG shows coarse afib.   Although patient has a flat troponin, his reported symptoms are concerning given their similarity to symptoms noted prior to CABG in 2020.  While patient does appear to be in A-fib discussed plan for left heart catheterization with patient to further characterize patency of grafts. Echocardiogram ordered Likely to be started on Plavix/Eliquis.    Paroxysmal afib Secondary hypercoagulable state  Patient with history of afib in 2020 following CABG. He was initially placed on Amiodarone and Eliquis, subsequently discontinued in January 2021. Triage ECG this admission shows coarse afib with controlled ventricular rate in the 70s.   Continue home metoprolol 25 mg twice daily.  Patient has well-controlled rates. Plan to initiate Eliquis following left heart catheterization today.  Hypertension  Patient BP generally well-controlled.  Continue metoprolol tartrate 25 mg twice daily, furosemide 20 mg.  Hyperlipidemia  Continue atorvastatin 80 mg.  Chronic diastolic CHF  Patient appears euvolemic on exam.  Will repeat echocardiogram this admission.  Continue once daily Lasix 20 mg.  Risk Assessment/Risk Scores:     TIMI Risk Score for Unstable Angina or Non-ST Elevation MI:   The patient's TIMI risk score is 5,  which indicates a 26% risk of all cause mortality, new or recurrent myocardial infarction or need for urgent revascularization in the next 14 days.  New York Heart Association (NYHA) Functional Class NYHA Class I  CHA2DS2-VASc Score = 7   This indicates a 11.2% annual risk of stroke. The patient's score is based upon: CHF History: 1 HTN History: 1 Diabetes History: 0 Stroke History: 2 Vascular Disease History: 1 Age Score: 2 Gender Score: 0    For questions or updates, please contact [place] Please consult [email or web address] for contact info under    Signed, Lily Kocher, PA-C  06/03/2022 2:41 PM  I have personally seen and examined this patient. I agree with the assessment and plan as outlined above.  82 yo male with history of CAD s/p CABG in  2020,  HTN, HLD, chronic diastolic CHF, prior CVA, renal cell carcinoma admitted with chest pain. He has done well following his CABG in 2020 but over the past week has been having severe central chest pain. Troponin negative. EKG with new onset atrial fib, rate controlled. New finding during this admission. He did have post-op atrial fib in 2020 but none since.  Labs reviewed by me EKG reviewed by me and shows atrial fibrillation  My exam:  General: Well developed, well nourished, NAD  HEENT: OP clear, mucus membranes moist  SKIN: warm, dry. No rashes. Neuro: No focal deficits  Musculoskeletal: Muscle strength 5/5 all ext  Psychiatric: Mood and affect normal  Neck: No JVD, no carotid bruits, no thyromegaly, no lymphadenopathy.  Lungs:Clear bilaterally, no wheezes, rhonci, crackles Cardiovascular: Irreg irreg. No murmurs, gallops or rubs. Abdomen:Soft. Bowel sounds present. Non-tender.  Extremities: No lower extremity edema. Pulses are 2 + in the bilateral DP/PT.  Plan:   CAD s/p CABG with unstable angina: Cardiac catheterization is indicated. Labs ok for cath.  I have reviewed the risks, indications, and alternatives to  cardiac catheterization, possible angioplasty, and stenting with the patient. Risks include but are not limited to bleeding, infection, vascular injury, stroke, myocardial infection, arrhythmia, kidney injury, radiation-related injury in the case of prolonged fluoroscopy use, emergency cardiac surgery, and death. The patient understands the risks of serious complication is 1-2 in 5885 with diagnostic cardiac cath and 1-2% or less with angioplasty/stenting. Will plan cardiac cath today.   New onset atrial fibrillation: New finding this admission. Unclear duration. He will need to be discharged on a DOAC. Rate is controlled.   Lauree Chandler, MD, [facility] 06/03/2022 2:51 PM

## 2022-06-03 NOTE — Progress Notes (Signed)
Patient transferred from cath lab at 1613hrs.  Right femoral site level zero. Patient oriented to unit and post cath instructions reviewed.

## 2022-06-03 NOTE — H&P (View-Only) (Signed)
Cardiology Consultation   Patient ID: ALSON MCPHEETERS MRN: [id number]; DOB: January 02, 1941  Admit date: 06/03/2022 Date of Consult: 06/03/2022  PCP:  Harlan Stains, MD   [place] Providers Cardiologist:  Peter Martinique, MD        Patient Profile:   Jake Dunn is a 82 y.o. male with a hx of CAD s/p CABG, GERD, HTN, HLD, chronic diastolic CHF, CVA, renal cell carcinoma s/p left nephrectomy, spinal stenosis s/p multiple operations who is being seen 06/03/2022 for the evaluation of chest pain and atrial fibrillation at the request of Dr. Kathrynn Humble.  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  This pain was associated with shortness of breath, nausea, and diaphoresis.  Patient says that pain on Monday was actually more severe than what he experienced back then.  Patient took a couple of nitro and pain improved to 4 out of 10.  He admits to minimizing symptoms but says that pain has persisted since Monday, fluctuating between a 2 or 3 out of 10 with increases up to 7 out of 10.  Patient reports about 8 of these more severe pain episodes.  He has taken nitro a few additional times and reported that this has helped to some degree with the pain.  Patient denies palpitations.  Exertional dyspnea has continued throughout this week and patient says that although he was able to complete tasks at work yesterday, they took him about twice as long as they normally do because of the need to take frequent breaks.  He denies recent illness, fevers, chills.  He says that shortness of breath was exertional only, no orthopnea.  Patient's cardiac history is notable for symptoms of chest pain and dyspnea. This led to a cardiac cath showing 3 vessel obstructive CAD in November  2020. He underwent CABG on 04/02/19 by Dr Kipp Brood. CABG X 4. LIMA LAD, RSVG PLV, Ramus, and D2 (T graft off the ramus vein graft). Post op course complicated by atrial fibrillation. He was loaded with amiodarone. On his visit in January 2021 amiodarone and Eliquis were discontinued.   Past Medical History:  Diagnosis Date   Adenomatous polyp    Anxiety    Arthritis    "knees, ankles, shoulders" (08/15/2016)   CAD (coronary artery disease)    s/p cath in 2014 showing significant stenosis in the diagonal side branches and in the RV marginal branch. These vessels were small and diffusely diseased. He is managed medically. 08/16/16 Cath, no disease progression   Chronic lower back pain    Disc disease, degenerative, cervical    GERD (gastroesophageal reflux disease)    Gout    Hyperlipidemia    Hypertension    Osteoarthritis    Renal cell carcinoma    left kidney removal   Stroke [facility])    MINI STROKE 15+ YRS AGO, no residual   Stroke ([place])    "I've had 2 or 3"; denies residual on 08/15/2016   Syncope and collapse     Past Surgical History:  Procedure Laterality Date   ANTERIOR CERVICAL DECOMP/DISCECTOMY FUSION  02/2004; 04/2005   [name] 09/20/2010; [name] 1/75/1025   APPLICATION OF ROBOTIC ASSISTANCE FOR SPINAL PROCEDURE N/A 01/02/2018   Procedure: APPLICATION OF ROBOTIC ASSISTANCE FOR SPINAL PROCEDURE;  Surgeon: Kristeen Miss, MD;  Location: [place];  Service: Neurosurgery;  Laterality: N/A;   BACK SURGERY     CARDIAC CATHETERIZATION  12/14/2008   ef 50-55%. SHOWED SIGNIFICANT STENOSIS AND TO DIAGONAL SIDE BRANCHES AND IN THE RIGHT VENTRICULAR MARGINAL BRANCH. THESE VESSELS WERE SMALL AND DIFFUSELY DISEASED   CARDIOVASCULAR STRESS TEST  12/10/2009   EF 61%. EKG negative. Mild peri ischemia. Managed medically.    CARPAL TUNNEL RELEASE Right 11/2005   [name] 09/20/2010   CARPAL TUNNEL RELEASE Left 06/2007   [name] 5/132012   COLONOSCOPY  11/2004   [name] 09/20/2010   COLONOSCOPY W/ BIOPSIES  AND POLYPECTOMY  09/2002   [name] 09/20/2010   CORONARY ARTERY BYPASS GRAFT N/A 04/02/2019   Procedure: CORONARY ARTERY BYPASS GRAFTING (CABG) using LIMA to LAD; Endoscopically harvested right greater saphenous vein to the PLB, Ramus, and Diag 2.;  Surgeon: Lajuana Matte, MD;  Location: [place];  Service: Open Heart Surgery;  Laterality: N/A;   INCISION AND DRAINAGE ABSCESS Right 10/18/2016   Procedure: INCISION AND DRAINAGE ABSCESS RIGHT SHOULDER;  Surgeon: [name], MD;  Location: [place];  Service: Orthopedics;  Laterality: Right;   INCISION AND DRAINAGE OF WOUND Right 08/2005   shoulder/notes 09/20/2010   IR FLUORO GUIDE CV LINE LEFT  10/19/2016   IR US GUIDE VASC ACCESS LEFT  10/19/2016   IRRIGATION AND DEBRIDEMENT SHOULDER Right 12/22/2016   Procedure: IRRIGATION AND DEBRIDEMENT RIGHT SHOULDER;  Surgeon: [name], MD;  Location: [place];  Service: Orthopedics;  Laterality: Right;   JOINT REPLACEMENT     LEFT HEART CATH AND CORONARY ANGIOGRAPHY N/A 08/16/2016   Procedure: Left Heart Cath and Coronary Angiography;  Surgeon: [name], MD;  Location: [place] CV LAB;  Service: Cardiovascular;  Laterality: N/A;   LEFT HEART CATH AND CORONARY ANGIOGRAPHY N/A 03/26/2019   Procedure: LEFT HEART CATH AND CORONARY ANGIOGRAPHY;  Surgeon: Martinique, Peter M, MD;  Location: [place] CV LAB;  Service: Cardiovascular;  Laterality: N/A;   LEFT HEART CATH AND CORS/GRAFTS ANGIOGRAPHY N/A 12/21/2020   Procedure: LEFT HEART CATH AND CORS/GRAFTS ANGIOGRAPHY;  Surgeon: Martinique, Peter M, MD;  Location: [place] CV [facility];  Service: Cardiovascular;  Laterality: N/A;   LEFT HEART CATHETERIZATION WITH CORONARY ANGIOGRAM N/A 08/09/2012   Procedure: LEFT HEART CATHETERIZATION WITH CORONARY ANGIOGRAM;  Surgeon: Peter M Martinique, MD;  Location: [facility] CATH LAB;  Service: Cardiovascular;  Laterality: N/A;   LUMBAR FUSION     NEPHRECTOMY Left early 2000s   REPLACEMENT TOTAL KNEE Right  08/2008   [name] 09/07/2010   SHOULDER ARTHROSCOPY WITH ROTATOR CUFF REPAIR Right 06/2005   [name] 09/20/2010   TOTAL KNEE ARTHROPLASTY  06/08/2011   Procedure: TOTAL KNEE ARTHROPLASTY;  Surgeon: [name], MD;  Location: [place];  Service: Orthopedics;  Laterality: Left;   TOTAL SHOULDER ARTHROPLASTY Right 04/06/2016   Procedure: RIGHT REVERSE TOTAL SHOULDER ARTHROPLASTY;  Surgeon: [name], MD;  Location: [place];  Service: Orthopedics;  Laterality: Right;   TOTAL SHOULDER REVISION Right 12/22/2016   Procedure: RIGHT SHOULDER GLENOID AND HUMERAL COMPONENT REVISION;  Surgeon: [name], MD;  Location: [place];  Service: Orthopedics;  Laterality: Right;   US ECHOCARDIOGRAPHY  12/15/2008   EF 60-65%   US ECHOCARDIOGRAPHY  09/28/2004   EF 55-60%     Home Medications:  Prior to Admission medications   Medication Sig Start Date End Date Taking? Authorizing Provider  acyclovir (ZOVIRAX) 400 MG tablet Take 400  mg by mouth 3 (three) times daily as needed (For cold sores). . 10/05/16   [provider]  allopurinol (ZYLOPRIM) 300 MG tablet Take 300 mg by mouth daily as needed (for gout flares).     [provider]  aspirin 81 MG EC tablet Take 1 tablet (81 mg total) by mouth daily. Swallow whole. 08/10/21   Martinique, Peter M, MD  atorvastatin (LIPITOR) 80 MG tablet Take 1 tablet (80 mg total) by mouth daily. 08/10/21 08/05/22  Martinique, Peter M, MD  Fluticasone-Umeclidin-Vilant (TRELEGY ELLIPTA) 100-62.5-25 MCG/INH AEPB Inhale 1 puff into the lungs daily. Patient taking differently: Inhale 1 puff into the lungs daily as needed (for shortness of breath). 09/14/20   Laurin Coder, MD  furosemide (LASIX) 20 MG tablet TAKE ONE TABLET BY MOUTH DAILY 12/10/21   Martinique, Peter M, MD  HYDROcodone-acetaminophen (NORCO/VICODIN) 5-325 MG tablet Take 1 tablet by mouth 3 (three) times daily as needed for moderate pain. 06/11/21   Dwyane Dee, MD  LORazepam (ATIVAN) 0.5 MG tablet Take 1 tablet  (0.5 mg total) by mouth 2 (two) times daily as needed for anxiety. 06/11/21   Dwyane Dee, MD  metoprolol tartrate (LOPRESSOR) 25 MG tablet Take 25 mg by mouth 2 (two) times daily.    [provider]  nitroGLYCERIN (NITROSTAT) 0.4 MG SL tablet Place 1 tablet (0.4 mg total) under the tongue every 5 (five) minutes as needed for chest pain. 02/07/22 05/08/22  Martinique, Peter M, MD  pantoprazole (PROTONIX) 40 MG tablet Take 40 mg by mouth every evening.     [provider]    Inpatient Medications: Scheduled Meds:  Continuous Infusions:  PRN Meds:   Allergies:    Allergies  Allergen Reactions   Latex Rash    Other reaction(s): Hives/Skin Rash/Itching   Tape Rash    Paper tape okay Other reaction(s): Hives/Rash/Itching    Social History:   Social History   Socioeconomic History   Marital status: Married    Spouse name: Not on file   Number of children: Not on file   Years of education: Not on file   Highest education level: Not on file  Occupational History   Not on file  Tobacco Use   Smoking status: Former    Types: Cigarettes    Quit date: 05/31/1989    Years since quitting: 33.0   Smokeless tobacco: Never   Tobacco comments:    08/15/2016 "hadn't smoked a carton of cigarettes all my life"  Vaping Use   Vaping Use: Never used  Substance and Sexual Activity   Alcohol use: Yes    Alcohol/week: 0.0 standard drinks of alcohol    Comment: "sometimes daily, sometimes weekly" beer   Drug use: No   Sexual activity: Not Currently  Other Topics Concern   Not on file  Social History Narrative   Not on file   Social Determinants of Health   Financial Resource Strain: Not on file  Food Insecurity: Not on file  Transportation Needs: Not on file  Physical Activity: Not on file  Stress: Not on file  Social Connections: Not on file  Intimate Partner Violence: Not on file    Family History:    Family History  Problem Relation Age of Onset   Heart attack  Father    Heart disease Brother    Allergic rhinitis Neg Hx    Angioedema Neg Hx    Asthma Neg Hx    Atopy Neg Hx    Eczema  Neg Hx    Immunodeficiency Neg Hx    Urticaria Neg Hx      ROS:  Please see the history of present illness.   All other ROS reviewed and negative.     Physical Exam/Data:   Vitals:   06/03/22 1052 06/03/22 1245 06/03/22 1257  BP: 121/78 116/80 116/80  Pulse: 73  74  Resp: '18 15 13  '$ Temp: 98.8 F (37.1 C)    TempSrc: Oral    SpO2: 93%  99%   No intake or output data in the 24 hours ending 06/03/22 1441    02/07/2022    7:56 AM 08/10/2021    7:54 AM 06/07/2021    6:48 PM  Last 3 Weights  Weight (lbs) 228 lb 224 lb 234 lb 9.6 oz  Weight (kg) 103.42 kg 101.606 kg 106.414 kg     There is no height or weight on file to calculate BMI.  General: No acute distress but uncomfortable appearing HEENT: normal Neck: no JVD Vascular: No carotid bruits; Distal pulses 2+ bilaterally Cardiac: Irregularly irregular, no murmurs rubs or gallops. Lungs:  clear to auscultation bilaterally, no wheezing, rhonchi or rales  Abd: soft, nontender, no hepatomegaly  Ext: no edema Musculoskeletal:  No deformities, BUE and BLE strength normal and equal Skin: warm and dry  Neuro:  CNs 2-12 intact, no focal abnormalities noted Psych:  Normal affect   EKG:  The EKG was personally reviewed and demonstrates:   Telemetry:  Telemetry was personally reviewed and demonstrates:    Relevant CV Studies:  01/08/2021 TTE  IMPRESSIONS     1. Left ventricular ejection fraction, by estimation, is 50 to 55%. The  left ventricle has low normal function. The left ventricle demonstrates  regional wall motion abnormalities (see scoring diagram/findings for  description). There is moderate left  ventricular hypertrophy. Left ventricular diastolic parameters are  consistent with Grade I diastolic dysfunction (impaired relaxation). There  is moderate hypokinesis of the left ventricular,  basal-mid septal wall.   2. Right ventricular systolic function is moderately reduced. The right  ventricular size is mildly enlarged. There is normal pulmonary artery  systolic pressure. The estimated right ventricular systolic pressure is  56.4 mmHg.   3. Left atrial size was mildly dilated.   4. The mitral valve is abnormal. Trivial mitral valve regurgitation.   5. The aortic valve is tricuspid. Aortic valve regurgitation is not  visualized. Mild to moderate aortic valve sclerosis/calcification is  present, without any evidence of aortic stenosis.   6. The inferior vena cava is normal in size with greater than 50%  respiratory variability, suggesting right atrial pressure of 3 mmHg.   Comparison(s): Changes from prior study are noted. 09/05/2019: LVEF 60-65%,  severe LAE, moderate RAE.   FINDINGS   Left Ventricle: Left ventricular ejection fraction, by estimation, is 50  to 55%. The left ventricle has low normal function. The left ventricle  demonstrates regional wall motion abnormalities. Moderate hypokinesis of  the left ventricular, basal-mid  septal wall. The left ventricular internal cavity size was normal in size.  There is moderate left ventricular hypertrophy. Left ventricular diastolic  parameters are consistent with Grade I diastolic dysfunction (impaired  relaxation). Indeterminate  filling pressures.   Right Ventricle: The right ventricular size is mildly enlarged. No  increase in right ventricular wall thickness. Right ventricular systolic  function is moderately reduced. There is normal pulmonary artery systolic  pressure. The tricuspid regurgitant  velocity is 2.21 m/s, and with an assumed  right atrial pressure of 3 mmHg,  the estimated right ventricular systolic pressure is 74.0 mmHg.   Left Atrium: Left atrial size was mildly dilated.   Right Atrium: Right atrial size was normal in size.   Pericardium: There is no evidence of pericardial effusion.   Mitral  Valve: The mitral valve is abnormal. Mild to moderate mitral  annular calcification. Trivial mitral valve regurgitation. MV peak  gradient, 2.7 mmHg. The mean mitral valve gradient is 1.0 mmHg.   Tricuspid Valve: The tricuspid valve is grossly normal. Tricuspid valve  regurgitation is mild.   Aortic Valve: The aortic valve is tricuspid. Aortic valve regurgitation is  not visualized. Mild to moderate aortic valve sclerosis/calcification is  present, without any evidence of aortic stenosis.   Pulmonic Valve: The pulmonic valve was normal in structure. Pulmonic valve  regurgitation is not visualized.   Aorta: The aortic root and ascending aorta are structurally normal, with  no evidence of dilitation.   Venous: The inferior vena cava is normal in size with greater than 50%  respiratory variability, suggesting right atrial pressure of 3 mmHg.   IAS/Shunts: No atrial level shunt detected by color flow Doppler.   12/21/2020 LHC     Ost Cx to Mid Cx lesion is 80% stenosed.   Prox RCA lesion is 45% stenosed.   Ramus lesion is 90% stenosed.   2nd RPL lesion is 80% stenosed.   Mid LAD lesion is 65% stenosed.   Ost LM to Mid LM lesion is 40% stenosed.   2nd Diag lesion is 90% stenosed.   LIMA graft was visualized by angiography and is normal in caliber.   SVG graft was visualized by angiography and is normal in caliber.   SVG graft was visualized by angiography and is normal in caliber.   The graft exhibits no disease.   The graft exhibits no disease.   The graft exhibits no disease.   LV end diastolic pressure is normal.   3 vessel obstructive CAD Patent LIMA - this is tied to the second diagonal and not the LAD Patent SVG to the ramus intermediate. No visualization of SVG to diagonal which per op note came off the hood of the SVG to ramus Patent SVG to the PL branch of the RCA Normal LVEDP   Plan: there appears to be good blood flow to all major vessels. The LAD was not  revascularized but has borderline disease and the patient had prior Myoview with no ischemia in this territory. Other grafts are patent. Recommend continued medical therapy. Does not appear to be volume overloaded. Echo pending.  Given significant difficulty from the radial approach I would recommend femoral access for any future Cardiac cath procedures.  Diagnostic Dominance: Right   Laboratory Data:  High Sensitivity Troponin:   Recent Labs  Lab 06/03/22 1103 06/03/22 1259  TROPONINIHS 13 13     Chemistry Recent Labs  Lab 06/03/22 1103  NA 137  K 4.0  CL 106  CO2 21*  GLUCOSE 111*  BUN 18  CREATININE 1.23  CALCIUM 9.0  GFRNONAA 60*  ANIONGAP 10    No results for input(s): "PROT", "ALBUMIN", "AST", "ALT", "ALKPHOS", "BILITOT" in the last 168 hours. Lipids No results for input(s): "CHOL", "TRIG", "HDL", "LABVLDL", "LDLCALC", "CHOLHDL" in the last 168 hours.  Hematology Recent Labs  Lab 06/03/22 1103  WBC 7.8  RBC 4.06*  HGB 13.2  HCT 38.9*  MCV 95.8  MCH 32.5  MCHC 33.9  RDW 14.6  PLT  240   Thyroid No results for input(s): "TSH", "FREET4" in the last 168 hours.  BNPNo results for input(s): "BNP", "PROBNP" in the last 168 hours.  DDimer No results for input(s): "DDIMER" in the last 168 hours.   Radiology/Studies:  DG Chest 2 View  Result Date: 06/03/2022 CLINICAL DATA:  Chest pain, shortness of breath. EXAM: CHEST - 2 VIEW COMPARISON:  06/08/2021. FINDINGS: Intact median sternotomy wires. Normal heart size. Stable mediastinal with postoperative changes of median sternotomy and CABG. Clear lungs. No pleural effusion or pneumothorax. Reverse right shoulder arthroplasty. Surgical clips project over the upper abdomen. IMPRESSION: No evidence of acute cardiopulmonary disease. Electronically Signed   By: Emmit Alexanders M.D.   On: 06/03/2022 11:42     Assessment and Plan:  Jake Dunn is a 82 y.o. male with a hx of CAD s/p CABG, GERD, HTN, HLD, chronic diastolic  CHF, CVA, renal cell carcinoma s/p left nephrectomy, spinal stenosis s/p multiple operations who is being seen 06/03/2022 for the evaluation of chest pain and atrial fibrillation at the request of Dr. Kathrynn Humble.  Chest pain Hx CAD  Patient with hx LIMA-LAD, SVG-rPLA, SVG-RI/D2 in 2020 with Dr. Kipp Brood.  Presented to the emergency department today after experiencing nearly 5 days of low-level chest pain with intermittent episodes of severe, 7 out of 10 squeezing chest pain.  More severe chest pain was associated with nausea and diaphoresis.  Patient also admits having significant shortness of breath and decreased exertional tolerance this week. Troponin 15->15. ECG shows coarse afib.   Although patient has a flat troponin, his reported symptoms are concerning given their similarity to symptoms noted prior to CABG in 2020.  While patient does appear to be in A-fib discussed plan for left heart catheterization with patient to further characterize patency of grafts. Echocardiogram ordered Likely to be started on Plavix/Eliquis.    Paroxysmal afib Secondary hypercoagulable state  Patient with history of afib in 2020 following CABG. He was initially placed on Amiodarone and Eliquis, subsequently discontinued in January 2021. Triage ECG this admission shows coarse afib with controlled ventricular rate in the 70s.   Continue home metoprolol 25 mg twice daily.  Patient has well-controlled rates. Plan to initiate Eliquis following left heart catheterization today.  Hypertension  Patient BP generally well-controlled.  Continue metoprolol tartrate 25 mg twice daily, furosemide 20 mg.  Hyperlipidemia  Continue atorvastatin 80 mg.  Chronic diastolic CHF  Patient appears euvolemic on exam.  Will repeat echocardiogram this admission.  Continue once daily Lasix 20 mg.  Risk Assessment/Risk Scores:     TIMI Risk Score for Unstable Angina or Non-ST Elevation MI:   The patient's TIMI risk score is 5,  which indicates a 26% risk of all cause mortality, new or recurrent myocardial infarction or need for urgent revascularization in the next 14 days.  New York Heart Association (NYHA) Functional Class NYHA Class I  CHA2DS2-VASc Score = 7   This indicates a 11.2% annual risk of stroke. The patient's score is based upon: CHF History: 1 HTN History: 1 Diabetes History: 0 Stroke History: 2 Vascular Disease History: 1 Age Score: 2 Gender Score: 0    For questions or updates, please contact [place] Please consult [email or web address] for contact info under    Signed, Lily Kocher, PA-C  06/03/2022 2:41 PM  I have personally seen and examined this patient. I agree with the assessment and plan as outlined above.  82 yo male with history of CAD s/p CABG in  2020,  HTN, HLD, chronic diastolic CHF, prior CVA, renal cell carcinoma admitted with chest pain. He has done well following his CABG in 2020 but over the past week has been having severe central chest pain. Troponin negative. EKG with new onset atrial fib, rate controlled. New finding during this admission. He did have post-op atrial fib in 2020 but none since.  Labs reviewed by me EKG reviewed by me and shows atrial fibrillation  My exam:  General: Well developed, well nourished, NAD  HEENT: OP clear, mucus membranes moist  SKIN: warm, dry. No rashes. Neuro: No focal deficits  Musculoskeletal: Muscle strength 5/5 all ext  Psychiatric: Mood and affect normal  Neck: No JVD, no carotid bruits, no thyromegaly, no lymphadenopathy.  Lungs:Clear bilaterally, no wheezes, rhonci, crackles Cardiovascular: Irreg irreg. No murmurs, gallops or rubs. Abdomen:Soft. Bowel sounds present. Non-tender.  Extremities: No lower extremity edema. Pulses are 2 + in the bilateral DP/PT.  Plan:   CAD s/p CABG with unstable angina: Cardiac catheterization is indicated. Labs ok for cath.  I have reviewed the risks, indications, and alternatives to  cardiac catheterization, possible angioplasty, and stenting with the patient. Risks include but are not limited to bleeding, infection, vascular injury, stroke, myocardial infection, arrhythmia, kidney injury, radiation-related injury in the case of prolonged fluoroscopy use, emergency cardiac surgery, and death. The patient understands the risks of serious complication is 1-2 in 3570 with diagnostic cardiac cath and 1-2% or less with angioplasty/stenting. Will plan cardiac cath today.   New onset atrial fibrillation: New finding this admission. Unclear duration. He will need to be discharged on a DOAC. Rate is controlled.   Lauree Chandler, MD, [facility] 06/03/2022 2:51 PM

## 2022-06-04 ENCOUNTER — Observation Stay (HOSPITAL_BASED_OUTPATIENT_CLINIC_OR_DEPARTMENT_OTHER): Payer: Medicare Other

## 2022-06-04 DIAGNOSIS — R0789 Other chest pain: Secondary | ICD-10-CM

## 2022-06-04 DIAGNOSIS — R079 Chest pain, unspecified: Secondary | ICD-10-CM

## 2022-06-04 DIAGNOSIS — I4891 Unspecified atrial fibrillation: Secondary | ICD-10-CM | POA: Diagnosis not present

## 2022-06-04 LAB — BRAIN NATRIURETIC PEPTIDE: B Natriuretic Peptide: 244.9 pg/mL — ABNORMAL HIGH (ref 0.0–100.0)

## 2022-06-04 LAB — ECHOCARDIOGRAM COMPLETE
Area-P 1/2: 3.25 cm2
Calc EF: 60.7 %
Height: 67 in
S' Lateral: 4.8 cm
Single Plane A2C EF: 69.2 %
Single Plane A4C EF: 49.8 %
Weight: 3633.6 oz

## 2022-06-04 LAB — CBC
HCT: 40.3 % (ref 39.0–52.0)
Hemoglobin: 13.3 g/dL (ref 13.0–17.0)
MCH: 32.2 pg (ref 26.0–34.0)
MCHC: 33 g/dL (ref 30.0–36.0)
MCV: 97.6 fL (ref 80.0–100.0)
Platelets: 220 10*3/uL (ref 150–400)
RBC: 4.13 MIL/uL — ABNORMAL LOW (ref 4.22–5.81)
RDW: 14.8 % (ref 11.5–15.5)
WBC: 6.3 10*3/uL (ref 4.0–10.5)
nRBC: 0 % (ref 0.0–0.2)

## 2022-06-04 LAB — BASIC METABOLIC PANEL
Anion gap: 12 (ref 5–15)
BUN: 18 mg/dL (ref 8–23)
CO2: 21 mmol/L — ABNORMAL LOW (ref 22–32)
Calcium: 8.9 mg/dL (ref 8.9–10.3)
Chloride: 104 mmol/L (ref 98–111)
Creatinine, Ser: 1.37 mg/dL — ABNORMAL HIGH (ref 0.61–1.24)
GFR, Estimated: 52 mL/min — ABNORMAL LOW (ref >60)
Glucose, Bld: 124 mg/dL — ABNORMAL HIGH (ref 70–99)
Potassium: 4.1 mmol/L (ref 3.5–5.1)
Sodium: 137 mmol/L (ref 135–145)

## 2022-06-04 LAB — SEDIMENTATION RATE: Sed Rate: 22 mm/hr — ABNORMAL HIGH (ref 0–16)

## 2022-06-04 MED ORDER — RANOLAZINE ER 500 MG PO TB12
500.0000 mg | ORAL_TABLET | Freq: Two times a day (BID) | ORAL | Status: DC
  Administered 2022-06-04 – 2022-06-05 (×3): 500 mg via ORAL
  Filled 2022-06-04 (×3): qty 1

## 2022-06-04 MED ORDER — PERFLUTREN LIPID MICROSPHERE
1.0000 mL | INTRAVENOUS | Status: AC | PRN
  Administered 2022-06-04: 3 mL via INTRAVENOUS

## 2022-06-04 MED ORDER — ALUM & MAG HYDROXIDE-SIMETH 200-200-20 MG/5ML PO SUSP
30.0000 mL | Freq: Once | ORAL | Status: AC
  Administered 2022-06-04: 30 mL via ORAL
  Filled 2022-06-04: qty 30

## 2022-06-04 NOTE — Progress Notes (Signed)
Rounding Note    Patient Name: Jake Dunn Date of Encounter: 06/04/2022  [place] Cardiologist: Peter Martinique, MD   Subjective   Some ongoign chest pressure, SOB  Inpatient Medications    Scheduled Meds:  apixaban  5 mg Oral BID   aspirin EC  81 mg Oral Daily   atorvastatin  80 mg Oral Daily   metoprolol tartrate  25 mg Oral BID   pantoprazole  40 mg Oral QPM   sodium chloride flush  3 mL Intravenous Q12H   Continuous Infusions:  sodium chloride     PRN Meds: sodium chloride, acetaminophen, allopurinol, HYDROcodone-acetaminophen, LORazepam, ondansetron (ZOFRAN) IV, mouth rinse, sodium chloride flush   Vital Signs    Vitals:   06/03/22 2234 06/04/22 0012 06/04/22 0417 06/04/22 0809  BP: (!) 90/58 95/61 107/63 107/64  Pulse:  67 75 65  Resp:  '18 19 15  '$ Temp:  98 F (36.7 C) 98.2 F (36.8 C) 98 F (36.7 C)  TempSrc:  Oral Oral Oral  SpO2:  94% 91% 94%  Weight:      Height:        Intake/Output Summary (Last 24 hours) at 06/04/2022 0900 Last data filed at 06/04/2022 0400 Gross per 24 hour  Intake 1011.15 ml  Output 680 ml  Net 331.15 ml      06/03/2022    6:47 PM 02/07/2022    7:56 AM 08/10/2021    7:54 AM  Last 3 Weights  Weight (lbs) 227 lb 1.6 oz 228 lb 224 lb  Weight (kg) 103.012 kg 103.42 kg 101.606 kg      Telemetry    SR - Personally Reviewed  ECG    N/a - Personally Reviewed  Physical Exam  N/a GEN: No acute distress.   Neck: No JVD Cardiac: RRR, no murmurs, rubs, or gallops.  Respiratory: Clear to auscultation bilaterally. GI: Soft, nontender, non-distended  MS: No edema; No deformity. Neuro:  Nonfocal  Psych: Normal affect   Labs    High Sensitivity Troponin:   Recent Labs  Lab 06/03/22 1103 06/03/22 1259  TROPONINIHS 13 13     Chemistry Recent Labs  Lab 06/03/22 1103  NA 137  K 4.0  CL 106  CO2 21*  GLUCOSE 111*  BUN 18  CREATININE 1.23  CALCIUM 9.0  GFRNONAA 60*  ANIONGAP 10    Lipids No  results for input(s): "CHOL", "TRIG", "HDL", "LABVLDL", "LDLCALC", "CHOLHDL" in the last 168 hours.  Hematology Recent Labs  Lab 06/03/22 1103  WBC 7.8  RBC 4.06*  HGB 13.2  HCT 38.9*  MCV 95.8  MCH 32.5  MCHC 33.9  RDW 14.6  PLT 240   Thyroid No results for input(s): "TSH", "FREET4" in the last 168 hours.  BNPNo results for input(s): "BNP", "PROBNP" in the last 168 hours.  DDimer No results for input(s): "DDIMER" in the last 168 hours.   Radiology    CARDIAC CATHETERIZATION  Result Date: 06/03/2022 Multivessel CAD with mild-moderate left main stenosis, moderate LAD stenosis, severe ramus stenosis, severe PLA stenosis S/P CABG with continued graft patency as outlined Normal LVEDP Med Rx. No culprit for progressive angina - stable anatomy from prior cath studies. Start apixaban tomorrow for new onset afib. Likely DC tomorrow if stable.   DG Chest 2 View  Result Date: 06/03/2022 CLINICAL DATA:  Chest pain, shortness of breath. EXAM: CHEST - 2 VIEW COMPARISON:  06/08/2021. FINDINGS: Intact median sternotomy wires. Normal heart size. Stable mediastinal with postoperative  changes of median sternotomy and CABG. Clear lungs. No pleural effusion or pneumothorax. Reverse right shoulder arthroplasty. Surgical clips project over the upper abdomen. IMPRESSION: No evidence of acute cardiopulmonary disease. Electronically Signed   By: Emmit Alexanders M.D.   On: 06/03/2022 11:42    Cardiac Studies     Patient Profile     Jake Dunn is a 82 y.o. male with a hx of CAD s/p CABG, GERD, HTN, HLD, chronic diastolic CHF, CVA, renal cell carcinoma s/p left nephrectomy, spinal stenosis s/p multiple operations who is being seen 06/03/2022 for the evaluation of chest pain and atrial fibrillation at the request of Dr. Kathrynn Humble.   Assessment & Plan    1.CAD/Chest pain/SOB - history of prior CABG with LIMA-LAD, SVG-rPLA, SVG-RI/D2 in 2020  - presented with chest pain - trops neg, EKG no acute  ischemic changes - echo pending - cath: LM 50% and stable, diffuse mod LAD disease mid 65%, D2 90%, ramus 90%LCX 80%, RCA prox 45%, RPL 80%. Patent LIMA-D2, SVG-ramus, SVG-RPL. LVEDP 8 at cath - overall no culprit for anginal symptoms  -ongoing chest pressure unclear etiology. Trial of GI cocktail, start ranexa '500mg'$  bid as additional antianginal given soft bp's limit other options - CXR was clear, normal LVEDP on cath - f/u echo   2. Afib - history of afib in 2020 post CABG, initially placed on Amiodarone and Eliquis, subsequently discontinued in January 2021. No significant recurrence until now - continue rate control - start eliquis '5mg'$  bid, d/c aspirin    Given ongoing symptoms of chest pain, SOB, nausea will monitor overnight   For questions or updates, please contact [place] Please consult [email or web address] for contact info under        Signed, Carlyle Dolly, MD  06/04/2022, 9:00 AM

## 2022-06-04 NOTE — Care Management CC44 (Signed)
Condition Code 44 Documentation Completed  Patient Details  Name: Jake Dunn MRN: [id number] Date of Birth: 03/21/1941   Condition Code 44 given:  Yes Patient signature on Condition Code 44 notice:  Yes Documentation of 2 MD's agreement:  Yes Code 44 added to claim:  Yes    Bartholomew Crews, RN 06/04/2022, 9:18 AM

## 2022-06-04 NOTE — Discharge Instructions (Signed)
Information on my medicine - ELIQUIS (apixaban)  Why was Eliquis prescribed for you? Eliquis was prescribed for you to reduce the risk of a blood clot forming that can cause a stroke if you have a medical condition called atrial fibrillation (a type of irregular heartbeat).  What do You need to know about Eliquis ? Take your Eliquis TWICE DAILY - one tablet in the morning and one tablet in the evening with or without food. If you have difficulty swallowing the tablet whole please discuss with your pharmacist how to take the medication safely.  Take Eliquis exactly as prescribed by your doctor and DO NOT stop taking Eliquis without talking to the doctor who prescribed the medication.  Stopping may increase your risk of developing a stroke.  Refill your prescription before you run out.  After discharge, you should have regular check-up appointments with your healthcare provider that is prescribing your Eliquis.  In the future your dose may need to be changed if your kidney function or weight changes by a significant amount or as you get older.  What do you do if you miss a dose? If you miss a dose, take it as soon as you remember on the same day and resume taking twice daily.  Do not take more than one dose of ELIQUIS at the same time to make up a missed dose.  Important Safety Information A possible side effect of Eliquis is bleeding. You should call your healthcare provider right away if you experience any of the following: Bleeding from an injury or your nose that does not stop. Unusual colored urine (red or dark brown) or unusual colored stools (red or black). Unusual bruising for unknown reasons. A serious fall or if you hit your head (even if there is no bleeding).  Some medicines may interact with Eliquis and might increase your risk of bleeding or clotting while on Eliquis. To help avoid this, consult your healthcare provider or pharmacist prior to using any new prescription or  non-prescription medications, including herbals, vitamins, non-steroidal anti-inflammatory drugs (NSAIDs) and supplements.  This website has more information on Eliquis (apixaban): http://www.eliquis.com/eliquis/home

## 2022-06-04 NOTE — Care Management Obs Status (Signed)
[place] NOTIFICATION   Patient Details  Name: JEOVANNY CUADROS MRN: [id number] Date of Birth: 12-May-1940   Medicare Observation Status Notification Given:  Yes    Bartholomew Crews, RN 06/04/2022, 9:18 AM

## 2022-06-05 ENCOUNTER — Other Ambulatory Visit: Payer: Self-pay | Admitting: Cardiology

## 2022-06-05 ENCOUNTER — Observation Stay (HOSPITAL_COMMUNITY): Payer: Medicare Other

## 2022-06-05 DIAGNOSIS — I4891 Unspecified atrial fibrillation: Secondary | ICD-10-CM | POA: Diagnosis not present

## 2022-06-05 DIAGNOSIS — R0789 Other chest pain: Secondary | ICD-10-CM | POA: Diagnosis not present

## 2022-06-05 DIAGNOSIS — I7121 Aneurysm of the ascending aorta, without rupture: Secondary | ICD-10-CM | POA: Diagnosis not present

## 2022-06-05 LAB — COMPREHENSIVE METABOLIC PANEL
ALT: 15 U/L (ref 0–44)
AST: 13 U/L — ABNORMAL LOW (ref 15–41)
Albumin: 3.2 g/dL — ABNORMAL LOW (ref 3.5–5.0)
Alkaline Phosphatase: 72 U/L (ref 38–126)
Anion gap: 7 (ref 5–15)
BUN: 20 mg/dL (ref 8–23)
CO2: 23 mmol/L (ref 22–32)
Calcium: 8.8 mg/dL — ABNORMAL LOW (ref 8.9–10.3)
Chloride: 106 mmol/L (ref 98–111)
Creatinine, Ser: 1.35 mg/dL — ABNORMAL HIGH (ref 0.61–1.24)
GFR, Estimated: 53 mL/min — ABNORMAL LOW (ref >60)
Glucose, Bld: 109 mg/dL — ABNORMAL HIGH (ref 70–99)
Potassium: 4.1 mmol/L (ref 3.5–5.1)
Sodium: 136 mmol/L (ref 135–145)
Total Bilirubin: 0.5 mg/dL (ref 0.3–1.2)
Total Protein: 5.8 g/dL — ABNORMAL LOW (ref 6.5–8.1)

## 2022-06-05 LAB — TSH: TSH: 1.745 u[IU]/mL (ref 0.350–4.500)

## 2022-06-05 MED ORDER — RANOLAZINE ER 500 MG PO TB12
1000.0000 mg | ORAL_TABLET | Freq: Two times a day (BID) | ORAL | Status: DC
  Administered 2022-06-05 – 2022-06-08 (×6): 1000 mg via ORAL
  Filled 2022-06-05 (×6): qty 2

## 2022-06-05 MED ORDER — IOHEXOL 350 MG/ML SOLN
75.0000 mL | Freq: Once | INTRAVENOUS | Status: AC | PRN
  Administered 2022-06-05: 75 mL via INTRAVENOUS

## 2022-06-05 NOTE — Progress Notes (Signed)
Pt states he had an episode of chest pain "hard sharp" pain lasting 3-4 seconds after ambulating to the bathroom at approximately 0240.  None at present.

## 2022-06-05 NOTE — Progress Notes (Signed)
Rounding Note    Patient Name: Jake Dunn Date of Encounter: 06/05/2022  [place] Cardiologist: Peter Martinique, MD   Subjective   Severe chest pain episode after walking back into bed last night from the bathroom  Inpatient Medications    Scheduled Meds:  apixaban  5 mg Oral BID   atorvastatin  80 mg Oral Daily   metoprolol tartrate  25 mg Oral BID   pantoprazole  40 mg Oral QPM   ranolazine  500 mg Oral BID   sodium chloride flush  3 mL Intravenous Q12H   Continuous Infusions:  sodium chloride     PRN Meds: sodium chloride, acetaminophen, allopurinol, HYDROcodone-acetaminophen, LORazepam, ondansetron (ZOFRAN) IV, mouth rinse, sodium chloride flush   Vital Signs    Vitals:   06/04/22 2007 06/04/22 2010 06/05/22 0337 06/05/22 1045  BP: 111/68 124/71 104/72 124/74  Pulse:   68 76  Resp:   19   Temp:   98.1 F (36.7 C)   TempSrc:   Oral   SpO2:   96%   Weight:      Height:        Intake/Output Summary (Last 24 hours) at 06/05/2022 1242 Last data filed at 06/04/2022 2101 Gross per 24 hour  Intake 3 ml  Output --  Net 3 ml      06/03/2022    6:47 PM 02/07/2022    7:56 AM 08/10/2021    7:54 AM  Last 3 Weights  Weight (lbs) 227 lb 1.6 oz 228 lb 224 lb  Weight (kg) 103.012 kg 103.42 kg 101.606 kg      Telemetry    afib - Personally Reviewed  ECG    N/a - Personally Reviewed  Physical Exam   GEN: No acute distress.   Neck: No JVD Cardiac: irreg Respiratory: Clear to auscultation bilaterally. GI: Soft, nontender, non-distended  MS: No edema; No deformity. Neuro:  Nonfocal  Psych: Normal affect   Labs    High Sensitivity Troponin:   Recent Labs  Lab 06/03/22 1103 06/03/22 1259  TROPONINIHS 13 13     Chemistry Recent Labs  Lab 06/03/22 1103 06/04/22 0953 06/05/22 0200  NA 137 137 136  K 4.0 4.1 4.1  CL 106 104 106  CO2 21* 21* 23  GLUCOSE 111* 124* 109*  BUN '18 18 20  '$ CREATININE 1.23 1.37* 1.35*  CALCIUM 9.0 8.9  8.8*  PROT  --   --  5.8*  ALBUMIN  --   --  3.2*  AST  --   --  13*  ALT  --   --  15  ALKPHOS  --   --  72  BILITOT  --   --  0.5  GFRNONAA 60* 52* 53*  ANIONGAP '10 12 7    '$ Lipids No results for input(s): "CHOL", "TRIG", "HDL", "LABVLDL", "LDLCALC", "CHOLHDL" in the last 168 hours.  Hematology Recent Labs  Lab 06/03/22 1103 06/04/22 0953  WBC 7.8 6.3  RBC 4.06* 4.13*  HGB 13.2 13.3  HCT 38.9* 40.3  MCV 95.8 97.6  MCH 32.5 32.2  MCHC 33.9 33.0  RDW 14.6 14.8  PLT 240 220   Thyroid  Recent Labs  Lab 06/05/22 0200  TSH 1.745    BNP Recent Labs  Lab 06/04/22 0953  BNP 244.9*    DDimer No results for input(s): "DDIMER" in the last 168 hours.   Radiology    ECHOCARDIOGRAM COMPLETE  Result Date: 06/04/2022    ECHOCARDIOGRAM REPORT  Patient Name:   Jake Dunn Date of Exam: 06/04/2022 Medical Rec #:  [id number]      Height:       67.0 in Accession #:    [id number]     Weight:       227.1 lb Date of Birth:  March 21, 1941      BSA:          2.134 m Patient Age:    82 years       BP:           107/64 mmHg Patient Gender: M              HR:           65 bpm. Exam Location:  Inpatient Procedure: 2D Echo, Cardiac Doppler, Color Doppler and Intracardiac            Opacification Agent Indications:    Chest Pain R07.9  History:        Patient has prior history of Echocardiogram examinations, most                 recent 03/30/2019. Stroke; Risk Factors:Hypertension and                 Dyslipidemia.  Sonographer:    Darlina Sicilian RDCS Referring Phys: [place]  1. Pt in atrial fibrillation at time of study.  2. Left ventricular ejection fraction, by estimation, is 50 to 55%. The left ventricle has low normal function. The left ventricle has no regional wall motion abnormalities. The left ventricular internal cavity size was mildly dilated. There is mild concentric left ventricular hypertrophy. Left ventricular diastolic parameters are indeterminate.  3. Right  ventricular systolic function is normal. The right ventricular size is normal. There is normal pulmonary artery systolic pressure.  4. Left atrial size was mildly dilated.  5. Right atrial size was mildly dilated.  6. The mitral valve is grossly normal. Mild mitral valve regurgitation. No evidence of mitral stenosis.  7. The aortic valve is tricuspid. There is mild calcification of the aortic valve. Aortic valve regurgitation is not visualized. Aortic valve sclerosis is present, with no evidence of aortic valve stenosis.  8. Aortic dilatation noted. There is mild dilatation of the aortic root, measuring 40 mm.  9. The inferior vena cava is normal in size with greater than 50% respiratory variability, suggesting right atrial pressure of 3 mmHg. FINDINGS  Left Ventricle: Left ventricular ejection fraction, by estimation, is 50 to 55%. The left ventricle has low normal function. The left ventricle has no regional wall motion abnormalities. Definity contrast agent was given IV to delineate the left ventricular endocardial borders. The left ventricular internal cavity size was mildly dilated. There is mild concentric left ventricular hypertrophy. Left ventricular diastolic function could not be evaluated due to atrial fibrillation. Left ventricular diastolic parameters are indeterminate. Right Ventricle: The right ventricular size is normal. Right ventricular systolic function is normal. There is normal pulmonary artery systolic pressure. The tricuspid regurgitant velocity is 2.49 m/s, and with an assumed right atrial pressure of 3 mmHg,  the estimated right ventricular systolic pressure is 63.3 mmHg. Left Atrium: Left atrial size was mildly dilated. Right Atrium: Right atrial size was mildly dilated. Pericardium: There is no evidence of pericardial effusion. Mitral Valve: The mitral valve is grossly normal. Mild mitral annular calcification. Mild mitral valve regurgitation. No evidence of mitral valve stenosis.  Tricuspid Valve: The tricuspid valve is grossly normal. Tricuspid valve regurgitation is  mild . No evidence of tricuspid stenosis. Aortic Valve: The aortic valve is tricuspid. There is mild calcification of the aortic valve. Aortic valve regurgitation is not visualized. Aortic valve sclerosis is present, with no evidence of aortic valve stenosis. Pulmonic Valve: The pulmonic valve was grossly normal. Pulmonic valve regurgitation is trivial. No evidence of pulmonic stenosis. Aorta: Aortic dilatation noted. There is mild dilatation of the aortic root, measuring 40 mm. Venous: The inferior vena cava is normal in size with greater than 50% respiratory variability, suggesting right atrial pressure of 3 mmHg. IAS/Shunts: No atrial level shunt detected by color flow Doppler. Additional Comments: Pt in atrial fibrillation at time of study.  LEFT VENTRICLE PLAX 2D LVIDd:         5.70 cm      Diastology LVIDs:         4.80 cm      LV e' medial:    6.90 cm/s LV PW:         1.30 cm      LV E/e' medial:  17.0 LV IVS:        1.30 cm      LV e' lateral:   9.89 cm/s LVOT diam:     2.50 cm      LV E/e' lateral: 11.8 LV SV:         72 LV SV Index:   34 LVOT Area:     4.91 cm  LV Volumes (MOD) LV vol d, MOD A2C: 90.8 ml LV vol d, MOD A4C: 130.0 ml LV vol s, MOD A2C: 28.0 ml LV vol s, MOD A4C: 65.3 ml LV SV MOD A2C:     62.8 ml LV SV MOD A4C:     130.0 ml LV SV MOD BP:      72.4 ml RIGHT VENTRICLE RV S prime:     7.62 cm/s LEFT ATRIUM             Index LA diam:        4.70 cm 2.20 cm/m LA Vol (A2C):   71.8 ml 33.64 ml/m LA Vol (A4C):   86.1 ml 40.34 ml/m LA Biplane Vol: 82.7 ml 38.75 ml/m  AORTIC VALVE LVOT Vmax:   72.20 cm/s LVOT Vmean:  54.400 cm/s LVOT VTI:    0.147 m  AORTA Ao Root diam: 3.40 cm Ao Asc diam:  4.00 cm MITRAL VALVE                TRICUSPID VALVE MV Area (PHT): 3.25 cm     TR Peak grad:   24.8 mmHg MV Decel Time: 234 msec     TR Vmax:        249.00 cm/s MV E velocity: 117.00 cm/s                              SHUNTS                             Systemic VTI:  0.15 m                             Systemic Diam: 2.50 cm Kirk Ruths MD Electronically signed by Kirk Ruths MD Signature Date/Time: 06/04/2022/2:38:17 PM    Final    CARDIAC CATHETERIZATION  Result Date: 06/03/2022 Multivessel CAD with mild-moderate left main stenosis, moderate LAD stenosis,  severe ramus stenosis, severe PLA stenosis S/P CABG with continued graft patency as outlined Normal LVEDP Med Rx. No culprit for progressive angina - stable anatomy from prior cath studies. Start apixaban tomorrow for new onset afib. Likely DC tomorrow if stable.    Cardiac Studies     Patient Profile     Jake Dunn is a 82 y.o. male with a hx of CAD s/p CABG, GERD, HTN, HLD, chronic diastolic CHF, CVA, renal cell carcinoma s/p left nephrectomy, spinal stenosis s/p multiple operations who is being seen 06/03/2022 for the evaluation of chest pain and atrial fibrillation at the request of Dr. Kathrynn Humble.    Assessment & Plan    1.CAD/Chest pain/SOB - history of prior CABG with LIMA-LAD, SVG-rPLA, SVG-RI/D2 in 2020  - presented with chest pain - trops neg, EKG no acute ischemic changes - echo LVEF 50-55%, no WMAs, indet diastolic, normal RV - cath: LM 50% and stable, diffuse mod LAD disease mid 65%, D2 90%, ramus 90%LCX 80%, RCA prox 45%, RPL 80%. Patent LIMA-D2, SVG-ramus, SVG-RPL. LVEDP 8 at cath - overall no culprit for anginal symptoms   -ongoing chest pressure unclear etiology.  - Trial of GI cocktail yesterday without improvement - started ranexa '500mg'$  bid as additional antianginal given soft bp's limit other options, increase to '1000mg'$  bid today.  - CXR was clear, normal LVEDP on cath would indicate not fluid related - with ongoing chest pains, SOB/DOE, new onset atrial arrhythmia without other etiology of ongoing symptoms will check CT PE    2. Afib - history of afib in 2020 post CABG, initially placed on Amiodarone and Eliquis,  subsequently discontinued in January 2021. No significant recurrence until now - continue rate control - started eliquis '5mg'$  bid, d/c aspirin  - if no other etiology for his SOB may warrant trial of getting him back in NSR. Rates have been well controlled but perhaps just very intolerant to afib.      With ongoing chest pains, SOB will continue to monitor him in the hospital overnight   For questions or updates, please contact [place] Please consult [email or web address] for contact info under        Signed, Carlyle Dolly, MD  06/05/2022, 12:42 PM

## 2022-06-06 ENCOUNTER — Observation Stay (HOSPITAL_COMMUNITY): Payer: Medicare Other

## 2022-06-06 ENCOUNTER — Other Ambulatory Visit (HOSPITAL_COMMUNITY): Payer: Self-pay

## 2022-06-06 ENCOUNTER — Encounter (HOSPITAL_COMMUNITY): Payer: Self-pay | Admitting: Cardiovascular Disease

## 2022-06-06 DIAGNOSIS — I2089 Other forms of angina pectoris: Secondary | ICD-10-CM | POA: Diagnosis not present

## 2022-06-06 DIAGNOSIS — K8689 Other specified diseases of pancreas: Secondary | ICD-10-CM | POA: Diagnosis not present

## 2022-06-06 DIAGNOSIS — N281 Cyst of kidney, acquired: Secondary | ICD-10-CM | POA: Diagnosis not present

## 2022-06-06 DIAGNOSIS — I700 Atherosclerosis of aorta: Secondary | ICD-10-CM | POA: Diagnosis not present

## 2022-06-06 DIAGNOSIS — R1900 Intra-abdominal and pelvic swelling, mass and lump, unspecified site: Secondary | ICD-10-CM

## 2022-06-06 DIAGNOSIS — K828 Other specified diseases of gallbladder: Secondary | ICD-10-CM | POA: Diagnosis not present

## 2022-06-06 DIAGNOSIS — I4891 Unspecified atrial fibrillation: Secondary | ICD-10-CM | POA: Diagnosis not present

## 2022-06-06 DIAGNOSIS — R1906 Epigastric swelling, mass or lump: Secondary | ICD-10-CM | POA: Diagnosis not present

## 2022-06-06 DIAGNOSIS — I25118 Atherosclerotic heart disease of native coronary artery with other forms of angina pectoris: Secondary | ICD-10-CM | POA: Diagnosis not present

## 2022-06-06 LAB — HIGH SENSITIVITY CRP: CRP, High Sensitivity: 2.52 mg/L (ref 0.00–3.00)

## 2022-06-06 MED ORDER — GADOBUTROL 1 MMOL/ML IV SOLN
10.0000 mL | Freq: Once | INTRAVENOUS | Status: AC | PRN
  Administered 2022-06-06: 10 mL via INTRAVENOUS

## 2022-06-06 MED FILL — Verapamil HCl IV Soln 2.5 MG/ML: INTRAVENOUS | Qty: 2 | Status: AC

## 2022-06-06 NOTE — TOC Benefit Eligibility Note (Signed)
Patient Advocate Encounter  Insurance verification completed.    The patient is currently admitted and upon discharge could be taking Eliquis 5 mg.  The current 30 day co-pay is $45.00.   The patient is insured through [employer] Part D   Jake Dunn, CPHT Pharmacy Patient Advocate Specialist [facility] Patient Advocate Team Direct Number: [phone]  Fax: [phone]       

## 2022-06-06 NOTE — Progress Notes (Signed)
Rounding Note    Patient Name: Jake Dunn Date of Encounter: 06/06/2022  [place] Cardiologist: Peter Martinique, MD   Subjective   BP 116/75.  Cr stable at 1.35. Denies any chest pain this morning.  Inpatient Medications    Scheduled Meds:  apixaban  5 mg Oral BID   atorvastatin  80 mg Oral Daily   metoprolol tartrate  25 mg Oral BID   pantoprazole  40 mg Oral QPM   ranolazine  1,000 mg Oral BID   sodium chloride flush  3 mL Intravenous Q12H   Continuous Infusions:  sodium chloride     PRN Meds: sodium chloride, acetaminophen, allopurinol, HYDROcodone-acetaminophen, LORazepam, ondansetron (ZOFRAN) IV, mouth rinse, sodium chloride flush   Vital Signs    Vitals:   06/05/22 1500 06/05/22 2122 06/05/22 2124 06/06/22 0629  BP: 112/67 117/73  116/75  Pulse: 78 63 72 65  Resp: '19 18  17  '$ Temp: 97.6 F (36.4 C) 97.7 F (36.5 C)  97.6 F (36.4 C)  TempSrc: Oral Oral  Oral  SpO2: 93% 97%  95%  Weight:      Height:       No intake or output data in the 24 hours ending 06/06/22 0859     06/03/2022    6:47 PM 02/07/2022    7:56 AM 08/10/2021    7:54 AM  Last 3 Weights  Weight (lbs) 227 lb 1.6 oz 228 lb 224 lb  Weight (kg) 103.012 kg 103.42 kg 101.606 kg      Telemetry    NSR in 60s - Personally Reviewed  ECG    N/a - Personally Reviewed  Physical Exam   GEN: No acute distress.   Neck: No JVD Cardiac: RRR, no murmurs Respiratory: Clear to auscultation bilaterally. GI: Soft, nontender, non-distended  MS: No edema; No deformity. Neuro:  Nonfocal  Psych: Normal affect   Labs    High Sensitivity Troponin:   Recent Labs  Lab 06/03/22 1103 06/03/22 1259  TROPONINIHS 13 13      Chemistry Recent Labs  Lab 06/03/22 1103 06/04/22 0953 06/05/22 0200  NA 137 137 136  K 4.0 4.1 4.1  CL 106 104 106  CO2 21* 21* 23  GLUCOSE 111* 124* 109*  BUN '18 18 20  '$ CREATININE 1.23 1.37* 1.35*  CALCIUM 9.0 8.9 8.8*  PROT  --   --  5.8*  ALBUMIN   --   --  3.2*  AST  --   --  13*  ALT  --   --  15  ALKPHOS  --   --  72  BILITOT  --   --  0.5  GFRNONAA 60* 52* 53*  ANIONGAP '10 12 7     '$ Lipids No results for input(s): "CHOL", "TRIG", "HDL", "LABVLDL", "LDLCALC", "CHOLHDL" in the last 168 hours.  Hematology Recent Labs  Lab 06/03/22 1103 06/04/22 0953  WBC 7.8 6.3  RBC 4.06* 4.13*  HGB 13.2 13.3  HCT 38.9* 40.3  MCV 95.8 97.6  MCH 32.5 32.2  MCHC 33.9 33.0  RDW 14.6 14.8  PLT 240 220    Thyroid  Recent Labs  Lab 06/05/22 0200  TSH 1.745     BNP Recent Labs  Lab 06/04/22 0953  BNP 244.9*     DDimer No results for input(s): "DDIMER" in the last 168 hours.   Radiology    CT Angio Chest Pulmonary Embolism (PE) W or WO Contrast  Result Date: 06/05/2022 CLINICAL DATA:  Pulmonary embolism (PE) suspected, high prob Chest pain. EXAM: CT ANGIOGRAPHY CHEST WITH CONTRAST TECHNIQUE: Multidetector CT imaging of the chest was performed using the standard protocol during bolus administration of intravenous contrast. Multiplanar CT image reconstructions and MIPs were obtained to evaluate the vascular anatomy. RADIATION DOSE REDUCTION: This exam was performed according to the departmental dose-optimization program which includes automated exposure control, adjustment of the mA and/or kV according to patient size and/or use of iterative reconstruction technique. CONTRAST:  71m OMNIPAQUE IOHEXOL 350 MG/ML SOLN COMPARISON:  Radiograph 06/03/2022 FINDINGS: Cardiovascular: There are no filling defects within the pulmonary arteries to suggest pulmonary embolus. The heart is enlarged. Post CABG with calcification of native coronary arteries. Fusiform aneurysmal dilatation of the ascending aorta at 4.5 cm. No periaortic stranding. There is no contrast in the aorta to assess for dissection. No pericardial effusion. Mediastinum/Nodes: Borderline 10 mm right hilar node series 5, image 50. Small mediastinal lymph nodes without additional  mediastinal nodal enlargement. No visible thyroid nodule. Tiny hiatal hernia. No esophageal wall thickening. Lungs/Pleura: Moderate central bronchial thickening. Mild emphysema. Subpleural reticulation and ground-glass without apical basilar gradient. Benign calcified granuloma in the right upper lobe, needing no further follow-up. Smoothly marginated 11 x 7 mm (9 mm mean dimension) nodule in the central right upper lobe series 7, image 41. No significant pleural effusion. Upper Abdomen: 2 contiguous lobulated masses are seen in the central abdomen, 6.2 cm and 4.8 cm both on series 5, image 135. These may be related to the pancreas, however are only partially included in the field of view. Calcified granuloma in the liver. Left nephrectomy changes are partially included. Musculoskeletal: There are no acute or suspicious osseous abnormalities. Review of the MIP images confirms the above findings. IMPRESSION: 1. No pulmonary embolus. 2. Smoothly marginated 11 x 7 mm nodule in the central right upper lobe. Per Fleischner Society Guidelines, consider a non-contrast Chest CT at 3 months, a PET/CT, or tissue sampling. These guidelines do not apply to immunocompromised patients and patients with cancer. Follow up in patients with significant comorbidities as clinically warranted. For lung cancer screening, adhere to Lung-RADS guidelines. Reference: Radiology. 2017; 284(1):228-43. 3. Cardiomegaly, post CABG. 4. Fusiform aneurysmal dilatation of the ascending aorta at 4.5 cm. Ascending thoracic aortic aneurysm. Recommend semi-annual imaging followup by CTA or MRA and referral to cardiothoracic surgery if not already obtained. This recommendation follows 2010 ACCF/AHA/AATS/ACR/ASA/SCA/SCAI/SIR/STS/SVM Guidelines for the Diagnosis and Management of Patients With Thoracic Aortic Disease. Circulation. 2010; 121:: Y606-T016 Aortic aneurysm NOS (ICD10-I71.9) 5. Two contiguous lobulated masses in the central abdomen, 6.2 cm and 4.8  cm, may be related to the pancreas, however are only partially included in the field of view. This is incompletely characterized on this chest CTA. Workup for further assessment of potential pancreatic lesions would include pancreatic protocol MRI (if patient is able to tolerate breath hold technique and in absence of contraindication) or pancreatic protocol CT. Aortic Atherosclerosis (ICD10-I70.0) and Emphysema (ICD10-J43.9). Electronically Signed   By: MKeith RakeM.D.   On: 06/05/2022 16:05   ECHOCARDIOGRAM COMPLETE  Result Date: 06/04/2022    ECHOCARDIOGRAM REPORT   Patient Name:   Jake SHEHADEHDate of Exam: 06/04/2022 Medical Rec #:  [id number]     Height:       67.0 in Accession #:    [id number]    Weight:       227.1 lb Date of Birth:  607-Sep-1942     BSA:  2.134 m Patient Age:    58 years       BP:           107/64 mmHg Patient Gender: M              HR:           65 bpm. Exam Location:  Inpatient Procedure: 2D Echo, Cardiac Doppler, Color Doppler and Intracardiac            Opacification Agent Indications:    Chest Pain R07.9  History:        Patient has prior history of Echocardiogram examinations, most                 recent 03/30/2019. Stroke; Risk Factors:Hypertension and                 Dyslipidemia.  Sonographer:    Darlina Sicilian RDCS Referring Phys: [place]  1. Pt in atrial fibrillation at time of study.  2. Left ventricular ejection fraction, by estimation, is 50 to 55%. The left ventricle has low normal function. The left ventricle has no regional wall motion abnormalities. The left ventricular internal cavity size was mildly dilated. There is mild concentric left ventricular hypertrophy. Left ventricular diastolic parameters are indeterminate.  3. Right ventricular systolic function is normal. The right ventricular size is normal. There is normal pulmonary artery systolic pressure.  4. Left atrial size was mildly dilated.  5. Right atrial size was mildly  dilated.  6. The mitral valve is grossly normal. Mild mitral valve regurgitation. No evidence of mitral stenosis.  7. The aortic valve is tricuspid. There is mild calcification of the aortic valve. Aortic valve regurgitation is not visualized. Aortic valve sclerosis is present, with no evidence of aortic valve stenosis.  8. Aortic dilatation noted. There is mild dilatation of the aortic root, measuring 40 mm.  9. The inferior vena cava is normal in size with greater than 50% respiratory variability, suggesting right atrial pressure of 3 mmHg. FINDINGS  Left Ventricle: Left ventricular ejection fraction, by estimation, is 50 to 55%. The left ventricle has low normal function. The left ventricle has no regional wall motion abnormalities. Definity contrast agent was given IV to delineate the left ventricular endocardial borders. The left ventricular internal cavity size was mildly dilated. There is mild concentric left ventricular hypertrophy. Left ventricular diastolic function could not be evaluated due to atrial fibrillation. Left ventricular diastolic parameters are indeterminate. Right Ventricle: The right ventricular size is normal. Right ventricular systolic function is normal. There is normal pulmonary artery systolic pressure. The tricuspid regurgitant velocity is 2.49 m/s, and with an assumed right atrial pressure of 3 mmHg,  the estimated right ventricular systolic pressure is 40.1 mmHg. Left Atrium: Left atrial size was mildly dilated. Right Atrium: Right atrial size was mildly dilated. Pericardium: There is no evidence of pericardial effusion. Mitral Valve: The mitral valve is grossly normal. Mild mitral annular calcification. Mild mitral valve regurgitation. No evidence of mitral valve stenosis. Tricuspid Valve: The tricuspid valve is grossly normal. Tricuspid valve regurgitation is mild . No evidence of tricuspid stenosis. Aortic Valve: The aortic valve is tricuspid. There is mild calcification of the  aortic valve. Aortic valve regurgitation is not visualized. Aortic valve sclerosis is present, with no evidence of aortic valve stenosis. Pulmonic Valve: The pulmonic valve was grossly normal. Pulmonic valve regurgitation is trivial. No evidence of pulmonic stenosis. Aorta: Aortic dilatation noted. There is mild dilatation of the aortic  root, measuring 40 mm. Venous: The inferior vena cava is normal in size with greater than 50% respiratory variability, suggesting right atrial pressure of 3 mmHg. IAS/Shunts: No atrial level shunt detected by color flow Doppler. Additional Comments: Pt in atrial fibrillation at time of study.  LEFT VENTRICLE PLAX 2D LVIDd:         5.70 cm      Diastology LVIDs:         4.80 cm      LV e' medial:    6.90 cm/s LV PW:         1.30 cm      LV E/e' medial:  17.0 LV IVS:        1.30 cm      LV e' lateral:   9.89 cm/s LVOT diam:     2.50 cm      LV E/e' lateral: 11.8 LV SV:         72 LV SV Index:   34 LVOT Area:     4.91 cm  LV Volumes (MOD) LV vol d, MOD A2C: 90.8 ml LV vol d, MOD A4C: 130.0 ml LV vol s, MOD A2C: 28.0 ml LV vol s, MOD A4C: 65.3 ml LV SV MOD A2C:     62.8 ml LV SV MOD A4C:     130.0 ml LV SV MOD BP:      72.4 ml RIGHT VENTRICLE RV S prime:     7.62 cm/s LEFT ATRIUM             Index LA diam:        4.70 cm 2.20 cm/m LA Vol (A2C):   71.8 ml 33.64 ml/m LA Vol (A4C):   86.1 ml 40.34 ml/m LA Biplane Vol: 82.7 ml 38.75 ml/m  AORTIC VALVE LVOT Vmax:   72.20 cm/s LVOT Vmean:  54.400 cm/s LVOT VTI:    0.147 m  AORTA Ao Root diam: 3.40 cm Ao Asc diam:  4.00 cm MITRAL VALVE                TRICUSPID VALVE MV Area (PHT): 3.25 cm     TR Peak grad:   24.8 mmHg MV Decel Time: 234 msec     TR Vmax:        249.00 cm/s MV E velocity: 117.00 cm/s                             SHUNTS                             Systemic VTI:  0.15 m                             Systemic Diam: 2.50 cm Kirk Ruths MD Electronically signed by Kirk Ruths MD Signature Date/Time: 06/04/2022/2:38:17 PM     Final     Cardiac Studies     Patient Profile     Jake Dunn is a 82 y.o. male with a hx of CAD s/p CABG, GERD, HTN, HLD, chronic diastolic CHF, CVA, renal cell carcinoma s/p left nephrectomy, spinal stenosis s/p multiple operations who is being seen 06/03/2022 for the evaluation of chest pain and atrial fibrillation at the request of Dr. Kathrynn Humble.    Assessment & Plan    CAD/Chest pain/SOB: history of prior CABG with LIMA-LAD, SVG-rPLA, SVG-RI/D2  in 2020.  Presented with chest pain. Trops neg, EKG no acute ischemic changes.  Echo LVEF 50-55%, no WMAs, indet diastolic, normal RV.  Cath: LM 50% and stable, diffuse mod LAD disease mid 65%, D2 90%, ramus 90%LCX 80%, RCA prox 45%, RPL 80%. Patent LIMA-D2, SVG-ramus, SVG-RPL. LVEDP 8 at cath.  Stable anatomy from prior Studies, no culprit for progressive angina. - Trial of GI cocktail without improvement - started ranexa '500mg'$  bid as additional antianginal given soft bp's limit other options, increased to 1000 mg twice daily - CXR was clear, normal LVEDP on cath would indicate not fluid related - with ongoing chest pains, SOB/DOE, new onset atrial arrhythmia without other etiology of ongoing symptoms, CTPA was ordered on 1/28, which showed no PE but was noted to have lobulated masses in the central abdomen to be related to the pancreas, incompletely visualized -He reports no chest pain since yesterday.  May have been related to A-fib, converted to sinus rhythm yesterday afternoon.  Abdominal masses: 2 contiguous lobulated masses in central abdomen on CTPA, incompletely visualized.  Discussed findings with patient and his wife.  Pancreatic protocol MRI recommended, will order  Lung nodule: 11 x 7 mm nodule in the central right upper lobe.  CT chest at 3 months or PET/CT recommended  Aortic aneurysm: Ascending aorta measured 4.5 cm.  Will monitor    Afib: history of afib in 2020 post CABG, initially placed on Amiodarone and Eliquis, subsequently  discontinued in January 2021. No significant recurrence until now - continue rate control - started eliquis '5mg'$  bid - spontaneously converted to sinus rhythm on 1/28  -Diet heart healthy -DVT PPx: Eliquis -Code: Full    For questions or updates, please contact [place] Please consult [email or web address] for contact info under        Signed, Donato Heinz, MD  06/06/2022, 8:59 AM

## 2022-06-06 NOTE — Plan of Care (Signed)
  Problem: Cardiovascular: Goal: Ability to achieve and maintain adequate cardiovascular perfusion will improve Outcome: Progressing Goal: Vascular access site(s) Level 0-1 will be maintained Outcome: Progressing   Problem: Education: Goal: Knowledge of General Education information will improve Description: Including pain rating scale, medication(s)/side effects and non-pharmacologic comfort measures Outcome: Progressing   Problem: Health Behavior/Discharge Planning: Goal: Ability to manage health-related needs will improve Outcome: Progressing   Problem: Clinical Measurements: Goal: Cardiovascular complication will be avoided Outcome: Progressing   Problem: Coping: Goal: Level of anxiety will decrease Outcome: Progressing   Problem: Pain Managment: Goal: General experience of comfort will improve Outcome: Progressing   Problem: Safety: Goal: Ability to remain free from injury will improve Outcome: Progressing

## 2022-06-07 DIAGNOSIS — I4891 Unspecified atrial fibrillation: Secondary | ICD-10-CM | POA: Diagnosis not present

## 2022-06-07 DIAGNOSIS — R1906 Epigastric swelling, mass or lump: Secondary | ICD-10-CM

## 2022-06-07 DIAGNOSIS — I2089 Other forms of angina pectoris: Secondary | ICD-10-CM | POA: Diagnosis not present

## 2022-06-07 LAB — BASIC METABOLIC PANEL
Anion gap: 8 (ref 5–15)
BUN: 20 mg/dL (ref 8–23)
CO2: 23 mmol/L (ref 22–32)
Calcium: 8.9 mg/dL (ref 8.9–10.3)
Chloride: 105 mmol/L (ref 98–111)
Creatinine, Ser: 1.31 mg/dL — ABNORMAL HIGH (ref 0.61–1.24)
GFR, Estimated: 55 mL/min — ABNORMAL LOW (ref >60)
Glucose, Bld: 100 mg/dL — ABNORMAL HIGH (ref 70–99)
Potassium: 4.2 mmol/L (ref 3.5–5.1)
Sodium: 136 mmol/L (ref 135–145)

## 2022-06-07 LAB — CBC
HCT: 35.4 % — ABNORMAL LOW (ref 39.0–52.0)
Hemoglobin: 12 g/dL — ABNORMAL LOW (ref 13.0–17.0)
MCH: 32.7 pg (ref 26.0–34.0)
MCHC: 33.9 g/dL (ref 30.0–36.0)
MCV: 96.5 fL (ref 80.0–100.0)
Platelets: 205 10*3/uL (ref 150–400)
RBC: 3.67 MIL/uL — ABNORMAL LOW (ref 4.22–5.81)
RDW: 14.7 % (ref 11.5–15.5)
WBC: 6.3 10*3/uL (ref 4.0–10.5)
nRBC: 0 % (ref 0.0–0.2)

## 2022-06-07 LAB — LIPOPROTEIN A (LPA): Lipoprotein (a): 252.6 nmol/L — ABNORMAL HIGH (ref <75.0)

## 2022-06-07 MED ORDER — ASPIRIN 81 MG PO TBEC
81.0000 mg | DELAYED_RELEASE_TABLET | Freq: Every day | ORAL | Status: DC
  Administered 2022-06-07: 81 mg via ORAL
  Filled 2022-06-07: qty 1

## 2022-06-07 NOTE — Progress Notes (Signed)
IR asked to review patient's recent imaging for possible pancreatic mass biopsy. Case discussed with Dr. Earleen Newport. IR does not perform biopsies of the pancreas. IR recommends GI consult for endoscopy/EUS. Dr. Gardiner Rhyme made aware.   No IR procedure planned and the order will be deleted.  Soyla Dryer, [place] [phone] 06/07/2022, 10:18 AM

## 2022-06-07 NOTE — Progress Notes (Addendum)
Rounding Note    Patient Name: Jake Dunn Date of Encounter: 06/07/2022  [facility] Cardiologist: Peter Martinique, MD   Subjective   Denies any chest pain this morning.  Inpatient Medications    Scheduled Meds:  atorvastatin  80 mg Oral Daily   metoprolol tartrate  25 mg Oral BID   pantoprazole  40 mg Oral QPM   ranolazine  1,000 mg Oral BID   sodium chloride flush  3 mL Intravenous Q12H   Continuous Infusions:  sodium chloride     PRN Meds: sodium chloride, acetaminophen, allopurinol, HYDROcodone-acetaminophen, LORazepam, ondansetron (ZOFRAN) IV, mouth rinse, sodium chloride flush   Vital Signs    Vitals:   06/06/22 0629 06/06/22 1003 06/06/22 1924 06/07/22 0500  BP: 116/75 130/83 128/70 109/66  Pulse: 65 66 75 62  Resp: '17  16 16  '$ Temp: 97.6 F (36.4 C)  (!) 97.5 F (36.4 C) 97.9 F (36.6 C)  TempSrc: Oral  Oral Oral  SpO2: 95%  95% 95%  Weight:      Height:       No intake or output data in the 24 hours ending 06/07/22 0940     06/03/2022    6:47 PM 02/07/2022    7:56 AM 08/10/2021    7:54 AM  Last 3 Weights  Weight (lbs) 227 lb 1.6 oz 228 lb 224 lb  Weight (kg) 103.012 kg 103.42 kg 101.606 kg      Telemetry    NSR in 60s - Personally Reviewed  ECG    N/a - Personally Reviewed  Physical Exam   GEN: No acute distress.   Neck: No JVD Cardiac: RRR, no murmurs Respiratory: Clear to auscultation bilaterally. GI: Soft, nontender, non-distended  MS: No edema; No deformity. Neuro:  Nonfocal  Psych: Normal affect   Labs    High Sensitivity Troponin:   Recent Labs  Lab 06/03/22 1103 06/03/22 1259  TROPONINIHS 13 13      Chemistry Recent Labs  Lab 06/03/22 1103 06/04/22 0953 06/05/22 0200  NA 137 137 136  K 4.0 4.1 4.1  CL 106 104 106  CO2 21* 21* 23  GLUCOSE 111* 124* 109*  BUN '18 18 20  '$ CREATININE 1.23 1.37* 1.35*  CALCIUM 9.0 8.9 8.8*  PROT  --   --  5.8*  ALBUMIN  --   --  3.2*  AST  --   --  13*  ALT  --    --  15  ALKPHOS  --   --  72  BILITOT  --   --  0.5  GFRNONAA 60* 52* 53*  ANIONGAP '10 12 7     '$ Lipids No results for input(s): "CHOL", "TRIG", "HDL", "LABVLDL", "LDLCALC", "CHOLHDL" in the last 168 hours.  Hematology Recent Labs  Lab 06/03/22 1103 06/04/22 0953 06/07/22 0836  WBC 7.8 6.3 6.3  RBC 4.06* 4.13* 3.67*  HGB 13.2 13.3 12.0*  HCT 38.9* 40.3 35.4*  MCV 95.8 97.6 96.5  MCH 32.5 32.2 32.7  MCHC 33.9 33.0 33.9  RDW 14.6 14.8 14.7  PLT 240 220 205    Thyroid  Recent Labs  Lab 06/05/22 0200  TSH 1.745     BNP Recent Labs  Lab 06/04/22 0953  BNP 244.9*     DDimer No results for input(s): "DDIMER" in the last 168 hours.   Radiology    MR ABDOMEN W WO CONTRAST  Result Date: 06/06/2022 CLINICAL DATA:  Central mesenteric mass on CT of the  chest EXAM: MRI ABDOMEN WITHOUT AND WITH CONTRAST TECHNIQUE: Multiplanar multisequence MR imaging of the abdomen was performed both before and after the administration of intravenous contrast. CONTRAST:  33m GADAVIST GADOBUTROL 1 MMOL/ML IV SOLN COMPARISON:  CT angiogram chest 06/05/2022 FINDINGS: Lower chest: Significant breathing motion noted along several sequences. Grossly there is no pleural effusion at the lung bases. The heart is enlarged. Status post median sternotomy. Hepatobiliary: Gallbladder is distended. No obvious filling defect. No biliary ductal dilatation. No space-occupying liver lesion. No areas of hypervascularity. Patent portal vein. Pancreas: As seen on the CT there are 2 adjacent mass lesions identified along the course of the pancreas. The larger of the 2 is centered more along the pancreatic neck midline. Cephalocaudal length of 4.5 cm and axial on series 4 image 24 measuring 6.5 5.5 cm. This abuts the celiac, portal vein slight mass effect. These vessels are not encased. More affected by mass effect. Just adjacent to this towards the tail is a second lesion that is smaller. On series 4, image 24 axial plane  this measures 4.4 by 3.4 cm. Cephalocaudal length of 3.4 cm. There is associated atrophy of the tail with focal ductal dilatation of against the mass lesion. Both these lesions are with restricted diffusion and clear enhancement on the dynamic postcontrast. Spleen is nonenlarged. No abnormal enhancement or T2 signal. There is some susceptibility artifact along the margin of the spleen posteriorly. Spleen: Spleen is nonenlarged. No abnormal enhancement. There are some susceptibility artifact along the posterior aspect of the spleen from adjacent clips. Adrenals/Urinary Tract: Right adrenal gland is preserved. Left is not well seen. There are surgical changes from left nephrectomy. Multiple areas of susceptibility artifact are identified from the surgical clips as seen on CT scan. No clear abnormal soft tissue in the surgical bed. Right kidney has a Bosniak 1 posterior small renal cyst on series 4, image 28 measuring 11 mm. Few punctate foci elsewhere. No specific imaging follow up alone. Perinephric stranding Stomach/Bowel: Stomach is underdistended. Visualized small and large bowel in the abdomen is nondilated. Vascular/Lymphatic: Atherosclerotic changes seen along the abdominal aorta and branch vessels. Normal caliber IVC. Along the superior margin of the larger pancreatic lesion is a soft tissue nodule immediately abutting the lesion superiorly as seen on series 3, image 34 at 2.7 x 1.9 cm in the coronal plane and transverse 2.7 cm. This could be an adjacent lymph node. Other:  Motion. Musculoskeletal: Susceptibility artifact from the hardware along the spine. Scattered degenerative changes. IMPRESSION: Two adjacent aggressive appearing hypervascular masses in the central pancreas with associated ductal dilatation and atrophy. Additional probable prominent lymph node superiorly on the right. The mass lesions do cause mass effect along the adjacent celiac axis and portal vein. Grossly these vessels are patent. No  additional separate mass lesion including no specific liver lesion. Surgical changes from left nephrectomy. Overall taken to account the appearance today as well as the patient's history of nephrectomy there is differential to the findings in the pancreas. These very well could represent renal cell metastatic deposits. Neuroendocrine process is possible. These do not have the typical appearance of adenocarcinoma of the pancreas. Overall recommend further workup including consideration of sampling. Electronically Signed   By: AJill SideM.D.   On: 06/06/2022 17:07   CT Angio Chest Pulmonary Embolism (PE) W or WO Contrast  Result Date: 06/05/2022 CLINICAL DATA:  Pulmonary embolism (PE) suspected, high prob Chest pain. EXAM: CT ANGIOGRAPHY CHEST WITH CONTRAST TECHNIQUE: Multidetector CT imaging of  the chest was performed using the standard protocol during bolus administration of intravenous contrast. Multiplanar CT image reconstructions and MIPs were obtained to evaluate the vascular anatomy. RADIATION DOSE REDUCTION: This exam was performed according to the departmental dose-optimization program which includes automated exposure control, adjustment of the mA and/or kV according to patient size and/or use of iterative reconstruction technique. CONTRAST:  41m OMNIPAQUE IOHEXOL 350 MG/ML SOLN COMPARISON:  Radiograph 06/03/2022 FINDINGS: Cardiovascular: There are no filling defects within the pulmonary arteries to suggest pulmonary embolus. The heart is enlarged. Post CABG with calcification of native coronary arteries. Fusiform aneurysmal dilatation of the ascending aorta at 4.5 cm. No periaortic stranding. There is no contrast in the aorta to assess for dissection. No pericardial effusion. Mediastinum/Nodes: Borderline 10 mm right hilar node series 5, image 50. Small mediastinal lymph nodes without additional mediastinal nodal enlargement. No visible thyroid nodule. Tiny hiatal hernia. No esophageal wall  thickening. Lungs/Pleura: Moderate central bronchial thickening. Mild emphysema. Subpleural reticulation and ground-glass without apical basilar gradient. Benign calcified granuloma in the right upper lobe, needing no further follow-up. Smoothly marginated 11 x 7 mm (9 mm mean dimension) nodule in the central right upper lobe series 7, image 41. No significant pleural effusion. Upper Abdomen: 2 contiguous lobulated masses are seen in the central abdomen, 6.2 cm and 4.8 cm both on series 5, image 135. These may be related to the pancreas, however are only partially included in the field of view. Calcified granuloma in the liver. Left nephrectomy changes are partially included. Musculoskeletal: There are no acute or suspicious osseous abnormalities. Review of the MIP images confirms the above findings. IMPRESSION: 1. No pulmonary embolus. 2. Smoothly marginated 11 x 7 mm nodule in the central right upper lobe. Per Fleischner Society Guidelines, consider a non-contrast Chest CT at 3 months, a PET/CT, or tissue sampling. These guidelines do not apply to immunocompromised patients and patients with cancer. Follow up in patients with significant comorbidities as clinically warranted. For lung cancer screening, adhere to Lung-RADS guidelines. Reference: Radiology. 2017; 284(1):228-43. 3. Cardiomegaly, post CABG. 4. Fusiform aneurysmal dilatation of the ascending aorta at 4.5 cm. Ascending thoracic aortic aneurysm. Recommend semi-annual imaging followup by CTA or MRA and referral to cardiothoracic surgery if not already obtained. This recommendation follows 2010 ACCF/AHA/AATS/ACR/ASA/SCA/SCAI/SIR/STS/SVM Guidelines for the Diagnosis and Management of Patients With Thoracic Aortic Disease. Circulation. 2010; 121:: F638-G665 Aortic aneurysm NOS (ICD10-I71.9) 5. Two contiguous lobulated masses in the central abdomen, 6.2 cm and 4.8 cm, may be related to the pancreas, however are only partially included in the field of view.  This is incompletely characterized on this chest CTA. Workup for further assessment of potential pancreatic lesions would include pancreatic protocol MRI (if patient is able to tolerate breath hold technique and in absence of contraindication) or pancreatic protocol CT. Aortic Atherosclerosis (ICD10-I70.0) and Emphysema (ICD10-J43.9). Electronically Signed   By: MKeith RakeM.D.   On: 06/05/2022 16:05    Cardiac Studies     Patient Profile     Jake GEURTSis a 82y.o. male with a hx of CAD s/p CABG, GERD, HTN, HLD, chronic diastolic CHF, CVA, renal cell carcinoma s/p left nephrectomy, spinal stenosis s/p multiple operations who is being seen for chest pain.    Assessment & Plan    CAD/Chest pain/SOB: history of prior CABG with LIMA-LAD, SVG-rPLA, SVG-RI/D2 in 2020.  Presented with chest pain. Trops neg, EKG no acute ischemic changes.  Echo LVEF 50-55%, no WMAs, indet diastolic, normal RV.  Cath: LM 50% and stable, diffuse mod LAD disease mid 65%, D2 90%, ramus 90%LCX 80%, RCA prox 45%, RPL 80%. Patent LIMA-D2, SVG-ramus, SVG-RPL. LVEDP 8 at cath.  Stable anatomy from prior Studies, no culprit for progressive angina. - started ranexa '500mg'$  bid as additional antianginal given soft bp's limit other options, increased to 1000 mg twice daily - with ongoing chest pains, SOB/DOE, new onset atrial arrhythmia without other etiology of ongoing symptoms, CTPA was ordered on 1/28, which showed no PE but was noted to have lobulated masses in the central abdomen to be related to the pancreas, incompletely visualized -He reports no chest pain since 1/28.  Suspect have been related to A-fib, converted to sinus rhythm on 1/28 and denies any chest pain since that time  Abdominal masses: 2 contiguous lobulated masses in central abdomen on CTPA, incompletely visualized.  Pancreatic protocol MRI was done, which showed 2 adjacent aggressive appearing hypervascular masses and central pancreas, suspected renal cell  metastasis versus neuroendocrine process.  Biopsy recommended -Discussed results with Dr. Benay Spice in oncology.  Recommends obtaining biopsy and then will follow-up with Dr. Benay Spice in oncology clinic.  Will consult GI for EUS. He has been on Eliquis for A-fib (last dose evening of 1/29), will hold until biopsy  Lung nodule: 11 x 7 mm nodule in the central right upper lobe.  CT chest at 3 months or PET/CT recommended  Aortic aneurysm: Ascending aorta measured 4.5 cm.  Will monitor   Afib: history of afib in 2020 post CABG, initially placed on Amiodarone and Eliquis, subsequently discontinued in January 2021. No significant recurrence until now - continue rate control - started eliquis '5mg'$  bid, will hold in anticipation of biopsy as above - spontaneously converted to sinus rhythm on 1/28  -Diet heart healthy -DVT PPx: SCDs while holding Eliquis  -Code: Full    For questions or updates, please contact [place] Please consult [email or web address] for contact info under        Signed, Donato Heinz, MD  06/07/2022, 9:40 AM

## 2022-06-07 NOTE — H&P (View-Only) (Signed)
New Hematology/Oncology Consult   Requesting MD: Lauree Chandler        Reason for Consult: Abdominal masses  HPI: Mr. Schnitzer was diagnosed with renal cell carcinoma approximately 20 years ago and underwent a left nephrectomy.  He was admitted on 06/03/2022 with a 1 week history of dyspnea and chest pain. Cardiac catheterization with no explanation for angina found.  He was found to have atrial fibrillation.  Chest x-ray showed no acute change.  Mr. Stefanski was referred for a CT of the chest on 05/28/2022.  There was no evidence of pulmonary embolism.  Was a 9 mm smoothly marginated central right upper lobe nodule.  2 contiguous lobulated masses were seen in central abdomen measuring 6.2 and 4.8 cm.  These masses are partially included in the field-of-view.  An MRI of the abdomen on 06/06/2022 revealed no liver lesion.  2 adjacent mass lesions were noted at the pancreas with the largest lesion centered at the pancreatic neck with abutment of the celiac and portal vein.  A second lesion near the tail of pancreas measuring 4.4 x 3.4 cm there is pancreas tail atrophy with focal duct dilatation.  Spleen is not enlarged.  Left nephrectomy with no clear abnormal soft tissue in the surgical bed.  A soft tissue nodule is seen at the superior margin of the larger pancreas lesion  Mr. Kydd denies abdominal pain.  He has nausea, but no emesis.   Past Medical History:  Diagnosis Date   Adenomatous polyp    Anxiety    Arthritis    "knees, ankles, shoulders" (08/15/2016)   CAD (coronary artery disease)    s/p cath in 2014 showing significant stenosis in the diagonal side branches and in the RV marginal branch. These vessels were small and diffusely diseased. He is managed medically. 08/16/16 Cath, no disease progression   Chronic lower back pain    Disc disease, degenerative, cervical    GERD (gastroesophageal reflux disease)    Gout    Hyperlipidemia    Hypertension    Osteoarthritis    Renal  cell carcinoma    left kidney removal   Stroke [facility])    MINI STROKE 15+ YRS AGO, no residual   Stroke ([place])    "I've had 2 or 3"; denies residual on 08/15/2016   Syncope and collapse   :   Past Surgical History:  Procedure Laterality Date   ANTERIOR CERVICAL DECOMP/DISCECTOMY FUSION  02/2004; 04/2005   [name] 09/20/2010; [name] 11/12/2374   APPLICATION OF ROBOTIC ASSISTANCE FOR SPINAL PROCEDURE N/A 01/02/2018   Procedure: APPLICATION OF ROBOTIC ASSISTANCE FOR SPINAL PROCEDURE;  Surgeon: Kristeen Miss, MD;  Location: [place];  Service: Neurosurgery;  Laterality: N/A;   BACK SURGERY     CARDIAC CATHETERIZATION  12/14/2008   ef 50-55%. SHOWED SIGNIFICANT STENOSIS AND TO DIAGONAL SIDE BRANCHES AND IN THE RIGHT VENTRICULAR MARGINAL BRANCH. THESE VESSELS WERE SMALL AND DIFFUSELY DISEASED   CARDIOVASCULAR STRESS TEST  12/10/2009   EF 61%. EKG negative. Mild peri ischemia. Managed medically.    CARPAL TUNNEL RELEASE Right 11/2005   [name] 09/20/2010   CARPAL TUNNEL RELEASE Left 06/2007   [name] 5/132012   COLONOSCOPY  11/2004   [name] 09/20/2010   COLONOSCOPY W/ BIOPSIES AND POLYPECTOMY  09/2002   [name] 09/20/2010   CORONARY ARTERY BYPASS GRAFT N/A 04/02/2019   Procedure: CORONARY ARTERY BYPASS GRAFTING (CABG) using LIMA to LAD; Endoscopically harvested right greater saphenous vein to the PLB, Ramus, and Diag 2.;  Surgeon: Melodie Bouillon  O, MD;  Location: [place];  Service: Open Heart Surgery;  Laterality: N/A;   INCISION AND DRAINAGE ABSCESS Right 10/18/2016   Procedure: INCISION AND DRAINAGE ABSCESS RIGHT SHOULDER;  Surgeon: [name], MD;  Location: [place];  Service: Orthopedics;  Laterality: Right;   INCISION AND DRAINAGE OF WOUND Right 08/2005   shoulder/notes 09/20/2010   IR FLUORO GUIDE CV LINE LEFT  10/19/2016   IR US GUIDE VASC ACCESS LEFT  10/19/2016   IRRIGATION AND DEBRIDEMENT SHOULDER Right 12/22/2016   Procedure: IRRIGATION AND DEBRIDEMENT RIGHT SHOULDER;   Surgeon: [name], MD;  Location: [place];  Service: Orthopedics;  Laterality: Right;   JOINT REPLACEMENT     LEFT HEART CATH AND CORONARY ANGIOGRAPHY N/A 08/16/2016   Procedure: Left Heart Cath and Coronary Angiography;  Surgeon: [name], MD;  Location: [place] CV [facility];  Service: Cardiovascular;  Laterality: N/A;   LEFT HEART CATH AND CORONARY ANGIOGRAPHY N/A 03/26/2019   Procedure: LEFT HEART CATH AND CORONARY ANGIOGRAPHY;  Surgeon: Martinique, Peter M, MD;  Location: [place] CV [facility];  Service: Cardiovascular;  Laterality: N/A;   LEFT HEART CATH AND CORS/GRAFTS ANGIOGRAPHY N/A 12/21/2020   Procedure: LEFT HEART CATH AND CORS/GRAFTS ANGIOGRAPHY;  Surgeon: Martinique, Peter M, MD;  Location: [place] [facility];  Service: Cardiovascular;  Laterality: N/A;   LEFT HEART CATH AND CORS/GRAFTS ANGIOGRAPHY N/A 06/03/2022   Procedure: LEFT HEART CATH AND CORS/GRAFTS ANGIOGRAPHY;  Surgeon: [name], MD;  Location: [place] [facility];  Service: Cardiovascular;  Laterality: N/A;   LEFT HEART CATHETERIZATION WITH CORONARY ANGIOGRAM N/A 08/09/2012   Procedure: LEFT HEART CATHETERIZATION WITH CORONARY ANGIOGRAM;  Surgeon: Peter M Martinique, MD;  Location: [facility] CATH LAB;  Service: Cardiovascular;  Laterality: N/A;   LUMBAR FUSION     NEPHRECTOMY Left early 2000s   REPLACEMENT TOTAL KNEE Right 08/2008   [name] 09/07/2010   SHOULDER ARTHROSCOPY WITH ROTATOR CUFF REPAIR Right 06/2005   [name] 09/20/2010   TOTAL KNEE ARTHROPLASTY  06/08/2011   Procedure: TOTAL KNEE ARTHROPLASTY;  Surgeon: [name], MD;  Location: [place];  Service: Orthopedics;  Laterality: Left;   TOTAL SHOULDER ARTHROPLASTY Right 04/06/2016   Procedure: RIGHT REVERSE TOTAL SHOULDER ARTHROPLASTY;  Surgeon: [name], MD;  Location: [employer];  Service: Orthopedics;  Laterality: Right;   TOTAL SHOULDER REVISION Right 12/22/2016   Procedure: RIGHT SHOULDER GLENOID AND HUMERAL COMPONENT REVISION;  Surgeon: [name], MD;   Location: [place];  Service: Orthopedics;  Laterality: Right;   US ECHOCARDIOGRAPHY  12/15/2008   EF 60-65%   US ECHOCARDIOGRAPHY  09/28/2004   EF 55-60%  :   Current Facility-Administered Medications:    0.9 %  sodium chloride infusion, 250 mL, Intravenous, PRN, [name], MD   acetaminophen (TYLENOL) tablet 650 mg, 650 mg, Oral, Q4H PRN, [name], MD   allopurinol (ZYLOPRIM) tablet 300 mg, 300 mg, Oral, Daily PRN, [name], MD   aspirin EC tablet 81 mg, 81 mg, Oral, Daily, Donato Heinz, MD, 81 mg at 06/07/22 0955   atorvastatin (LIPITOR) tablet 80 mg, 80 mg, Oral, Daily, [name], MD, 80 mg at 06/07/22 0093   HYDROcodone-acetaminophen (NORCO/VICODIN) 5-325 MG per tablet 1 tablet, 1 tablet, Oral, TID PRN, [name], MD, 1 tablet at 06/07/22 0951   LORazepam (ATIVAN) tablet 0.5 mg, 0.5 mg, Oral, BID PRN, [name], MD, 0.5 mg at 06/06/22 1003   metoprolol tartrate (LOPRESSOR) tablet 25 mg, 25 mg, Oral,  BID, [name], MD, 25 mg at 06/07/22 0951   ondansetron [facility]) injection 4 mg, 4 mg, Intravenous, Q6H PRN, [name], MD, 4 mg at 06/04/22 3382   Oral care mouth rinse, 15 mL, Mouth Rinse, PRN, Burnell Blanks, MD   pantoprazole (PROTONIX) EC tablet 40 mg, 40 mg, Oral, QPM, [name], MD, 40 mg at 06/06/22 1737   ranolazine (RANEXA) 12 hr tablet 1,000 mg, 1,000 mg, Oral, BID, Arnoldo Lenis, MD, 1,000 mg at 06/07/22 0951   sodium chloride flush (NS) 0.9 % injection 3 mL, 3 mL, Intravenous, Q12H, [name], MD, 3 mL at 06/07/22 5053   sodium chloride flush (NS) 0.9 % injection 3 mL, 3 mL, Intravenous, PRN, [name], MD:   aspirin EC  81 mg Oral Daily   atorvastatin  80 mg Oral Daily   metoprolol tartrate  25 mg Oral BID   pantoprazole  40 mg Oral QPM   ranolazine  1,000 mg Oral BID   sodium chloride flush  3 mL Intravenous Q12H  :   Allergies  Allergen Reactions   Latex Rash    Other  reaction(s): Hives/Skin Rash/Itching   Tape Rash    Paper tape okay Other reaction(s): Hives/Rash/Itching  :  FH: Pancreas cancer, a brother had lung cancer, 3 sisters had lung cancer  SOCIAL HISTORY: He lives with his wife in [place].  He does not smoke cigarettes.  He used alcohol in the past.  He worked as a Psychologist, sport and exercise and now Actuary  Review of Systems:  Positives include: Chest pain and dyspnea for 1 week prior to hospital admission, nausea  A complete ROS was otherwise negative.   Physical Exam:  Blood pressure 110/65, pulse 68, temperature 97.7 F (36.5 C), temperature source Oral, resp. rate 17, height '5\' 7"'$  (1.702 m), weight 227 lb 1.6 oz (103 kg), SpO2 97 %.  HEENT: Oral cavity without visible mass, upper and lower denture plate Lungs: Clear bilaterally, no respiratory distress Cardiac: Regular rate and rhythm Abdomen: Soft, no mass, no hepatosplenomegaly, mild tenderness in the right lower abdomen GU: Testes without mass, right scrotal cyst, left epididymis cyst? Vascular: No leg edema Lymph nodes: No cervical, supraclavicular, axillary, or inguinal nodes Neurologic: Alert and oriented, the motor exam appears intact in the upper extremities bilaterally.  4+/5 strength with dorsiflexion of the right foot Skin: No rash Musculoskeletal: No spine tenderness  LABS:   Recent Labs    06/07/22 0836  WBC 6.3  HGB 12.0*  HCT 35.4*  PLT 205     Recent Labs    06/05/22 0200 06/07/22 0836  NA 136 136  K 4.1 4.2  CL 106 105  CO2 23 23  GLUCOSE 109* 100*  BUN 20 20  CREATININE 1.35* 1.31*  CALCIUM 8.8* 8.9      RADIOLOGY:  MR ABDOMEN W WO CONTRAST  Result Date: 06/06/2022 CLINICAL DATA:  Central mesenteric mass on CT of the chest EXAM: MRI ABDOMEN WITHOUT AND WITH CONTRAST TECHNIQUE: Multiplanar multisequence MR imaging of the abdomen was performed both before and after the administration of intravenous contrast. CONTRAST:  80m GADAVIST  GADOBUTROL 1 MMOL/ML IV SOLN COMPARISON:  CT angiogram chest 06/05/2022 FINDINGS: Lower chest: Significant breathing motion noted along several sequences. Grossly there is no pleural effusion at the lung bases. The heart is enlarged. Status post median sternotomy. Hepatobiliary: Gallbladder is distended. No obvious filling defect. No biliary ductal dilatation. No space-occupying liver lesion. No areas of hypervascularity. Patent portal vein.  Pancreas: As seen on the CT there are 2 adjacent mass lesions identified along the course of the pancreas. The larger of the 2 is centered more along the pancreatic neck midline. Cephalocaudal length of 4.5 cm and axial on series 4 image 24 measuring 6.5 5.5 cm. This abuts the celiac, portal vein slight mass effect. These vessels are not encased. More affected by mass effect. Just adjacent to this towards the tail is a second lesion that is smaller. On series 4, image 24 axial plane this measures 4.4 by 3.4 cm. Cephalocaudal length of 3.4 cm. There is associated atrophy of the tail with focal ductal dilatation of against the mass lesion. Both these lesions are with restricted diffusion and clear enhancement on the dynamic postcontrast. Spleen is nonenlarged. No abnormal enhancement or T2 signal. There is some susceptibility artifact along the margin of the spleen posteriorly. Spleen: Spleen is nonenlarged. No abnormal enhancement. There are some susceptibility artifact along the posterior aspect of the spleen from adjacent clips. Adrenals/Urinary Tract: Right adrenal gland is preserved. Left is not well seen. There are surgical changes from left nephrectomy. Multiple areas of susceptibility artifact are identified from the surgical clips as seen on CT scan. No clear abnormal soft tissue in the surgical bed. Right kidney has a Bosniak 1 posterior small renal cyst on series 4, image 28 measuring 11 mm. Few punctate foci elsewhere. No specific imaging follow up alone. Perinephric  stranding Stomach/Bowel: Stomach is underdistended. Visualized small and large bowel in the abdomen is nondilated. Vascular/Lymphatic: Atherosclerotic changes seen along the abdominal aorta and branch vessels. Normal caliber IVC. Along the superior margin of the larger pancreatic lesion is a soft tissue nodule immediately abutting the lesion superiorly as seen on series 3, image 34 at 2.7 x 1.9 cm in the coronal plane and transverse 2.7 cm. This could be an adjacent lymph node. Other:  Motion. Musculoskeletal: Susceptibility artifact from the hardware along the spine. Scattered degenerative changes. IMPRESSION: Two adjacent aggressive appearing hypervascular masses in the central pancreas with associated ductal dilatation and atrophy. Additional probable prominent lymph node superiorly on the right. The mass lesions do cause mass effect along the adjacent celiac axis and portal vein. Grossly these vessels are patent. No additional separate mass lesion including no specific liver lesion. Surgical changes from left nephrectomy. Overall taken to account the appearance today as well as the patient's history of nephrectomy there is differential to the findings in the pancreas. These very well could represent renal cell metastatic deposits. Neuroendocrine process is possible. These do not have the typical appearance of adenocarcinoma of the pancreas. Overall recommend further workup including consideration of sampling. Electronically Signed   By: Jill Side M.D.   On: 06/06/2022 17:07   CT Angio Chest Pulmonary Embolism (PE) W or WO Contrast  Result Date: 06/05/2022 CLINICAL DATA:  Pulmonary embolism (PE) suspected, high prob Chest pain. EXAM: CT ANGIOGRAPHY CHEST WITH CONTRAST TECHNIQUE: Multidetector CT imaging of the chest was performed using the standard protocol during bolus administration of intravenous contrast. Multiplanar CT image reconstructions and MIPs were obtained to evaluate the vascular anatomy.  RADIATION DOSE REDUCTION: This exam was performed according to the departmental dose-optimization program which includes automated exposure control, adjustment of the mA and/or kV according to patient size and/or use of iterative reconstruction technique. CONTRAST:  21m OMNIPAQUE IOHEXOL 350 MG/ML SOLN COMPARISON:  Radiograph 06/03/2022 FINDINGS: Cardiovascular: There are no filling defects within the pulmonary arteries to suggest pulmonary embolus. The heart is enlarged. Post  CABG with calcification of native coronary arteries. Fusiform aneurysmal dilatation of the ascending aorta at 4.5 cm. No periaortic stranding. There is no contrast in the aorta to assess for dissection. No pericardial effusion. Mediastinum/Nodes: Borderline 10 mm right hilar node series 5, image 50. Small mediastinal lymph nodes without additional mediastinal nodal enlargement. No visible thyroid nodule. Tiny hiatal hernia. No esophageal wall thickening. Lungs/Pleura: Moderate central bronchial thickening. Mild emphysema. Subpleural reticulation and ground-glass without apical basilar gradient. Benign calcified granuloma in the right upper lobe, needing no further follow-up. Smoothly marginated 11 x 7 mm (9 mm mean dimension) nodule in the central right upper lobe series 7, image 41. No significant pleural effusion. Upper Abdomen: 2 contiguous lobulated masses are seen in the central abdomen, 6.2 cm and 4.8 cm both on series 5, image 135. These may be related to the pancreas, however are only partially included in the field of view. Calcified granuloma in the liver. Left nephrectomy changes are partially included. Musculoskeletal: There are no acute or suspicious osseous abnormalities. Review of the MIP images confirms the above findings. IMPRESSION: 1. No pulmonary embolus. 2. Smoothly marginated 11 x 7 mm nodule in the central right upper lobe. Per Fleischner Society Guidelines, consider a non-contrast Chest CT at 3 months, a PET/CT, or  tissue sampling. These guidelines do not apply to immunocompromised patients and patients with cancer. Follow up in patients with significant comorbidities as clinically warranted. For lung cancer screening, adhere to Lung-RADS guidelines. Reference: Radiology. 2017; 284(1):228-43. 3. Cardiomegaly, post CABG. 4. Fusiform aneurysmal dilatation of the ascending aorta at 4.5 cm. Ascending thoracic aortic aneurysm. Recommend semi-annual imaging followup by CTA or MRA and referral to cardiothoracic surgery if not already obtained. This recommendation follows 2010 ACCF/AHA/AATS/ACR/ASA/SCA/SCAI/SIR/STS/SVM Guidelines for the Diagnosis and Management of Patients With Thoracic Aortic Disease. Circulation. 2010; 121: C623-J628. Aortic aneurysm NOS (ICD10-I71.9) 5. Two contiguous lobulated masses in the central abdomen, 6.2 cm and 4.8 cm, may be related to the pancreas, however are only partially included in the field of view. This is incompletely characterized on this chest CTA. Workup for further assessment of potential pancreatic lesions would include pancreatic protocol MRI (if patient is able to tolerate breath hold technique and in absence of contraindication) or pancreatic protocol CT. Aortic Atherosclerosis (ICD10-I70.0) and Emphysema (ICD10-J43.9). Electronically Signed   By: Keith Rake M.D.   On: 06/05/2022 16:05   ECHOCARDIOGRAM COMPLETE  Result Date: 06/04/2022    ECHOCARDIOGRAM REPORT   Patient Name:   ERMA JOUBERT Date of Exam: 06/04/2022 Medical Rec #:  [id number]      Height:       67.0 in Accession #:    [id number]     Weight:       227.1 lb Date of Birth:  [birth year]      BSA:          2.134 m Patient Age:    82 years       BP:           107/64 mmHg Patient Gender: M              HR:           65 bpm. Exam Location:  Inpatient Procedure: 2D Echo, Cardiac Doppler, Color Doppler and Intracardiac            Opacification Agent Indications:    Chest Pain R07.9  History:        Patient has prior history  of Echocardiogram examinations, most  recent 03/30/2019. Stroke; Risk Factors:Hypertension and                 Dyslipidemia.  Sonographer:    Darlina Sicilian RDCS Referring Phys: [place]  1. Pt in atrial fibrillation at time of study.  2. Left ventricular ejection fraction, by estimation, is 50 to 55%. The left ventricle has low normal function. The left ventricle has no regional wall motion abnormalities. The left ventricular internal cavity size was mildly dilated. There is mild concentric left ventricular hypertrophy. Left ventricular diastolic parameters are indeterminate.  3. Right ventricular systolic function is normal. The right ventricular size is normal. There is normal pulmonary artery systolic pressure.  4. Left atrial size was mildly dilated.  5. Right atrial size was mildly dilated.  6. The mitral valve is grossly normal. Mild mitral valve regurgitation. No evidence of mitral stenosis.  7. The aortic valve is tricuspid. There is mild calcification of the aortic valve. Aortic valve regurgitation is not visualized. Aortic valve sclerosis is present, with no evidence of aortic valve stenosis.  8. Aortic dilatation noted. There is mild dilatation of the aortic root, measuring 40 mm.  9. The inferior vena cava is normal in size with greater than 50% respiratory variability, suggesting right atrial pressure of 3 mmHg. FINDINGS  Left Ventricle: Left ventricular ejection fraction, by estimation, is 50 to 55%. The left ventricle has low normal function. The left ventricle has no regional wall motion abnormalities. Definity contrast agent was given IV to delineate the left ventricular endocardial borders. The left ventricular internal cavity size was mildly dilated. There is mild concentric left ventricular hypertrophy. Left ventricular diastolic function could not be evaluated due to atrial fibrillation. Left ventricular diastolic parameters are indeterminate. Right  Ventricle: The right ventricular size is normal. Right ventricular systolic function is normal. There is normal pulmonary artery systolic pressure. The tricuspid regurgitant velocity is 2.49 m/s, and with an assumed right atrial pressure of 3 mmHg,  the estimated right ventricular systolic pressure is 53.6 mmHg. Left Atrium: Left atrial size was mildly dilated. Right Atrium: Right atrial size was mildly dilated. Pericardium: There is no evidence of pericardial effusion. Mitral Valve: The mitral valve is grossly normal. Mild mitral annular calcification. Mild mitral valve regurgitation. No evidence of mitral valve stenosis. Tricuspid Valve: The tricuspid valve is grossly normal. Tricuspid valve regurgitation is mild . No evidence of tricuspid stenosis. Aortic Valve: The aortic valve is tricuspid. There is mild calcification of the aortic valve. Aortic valve regurgitation is not visualized. Aortic valve sclerosis is present, with no evidence of aortic valve stenosis. Pulmonic Valve: The pulmonic valve was grossly normal. Pulmonic valve regurgitation is trivial. No evidence of pulmonic stenosis. Aorta: Aortic dilatation noted. There is mild dilatation of the aortic root, measuring 40 mm. Venous: The inferior vena cava is normal in size with greater than 50% respiratory variability, suggesting right atrial pressure of 3 mmHg. IAS/Shunts: No atrial level shunt detected by color flow Doppler. Additional Comments: Pt in atrial fibrillation at time of study.  LEFT VENTRICLE PLAX 2D LVIDd:         5.70 cm      Diastology LVIDs:         4.80 cm      LV e' medial:    6.90 cm/s LV PW:         1.30 cm      LV E/e' medial:  17.0 LV IVS:        1.30  cm      LV e' lateral:   9.89 cm/s LVOT diam:     2.50 cm      LV E/e' lateral: 11.8 LV SV:         72 LV SV Index:   34 LVOT Area:     4.91 cm  LV Volumes (MOD) LV vol d, MOD A2C: 90.8 ml LV vol d, MOD A4C: 130.0 ml LV vol s, MOD A2C: 28.0 ml LV vol s, MOD A4C: 65.3 ml LV SV MOD A2C:      62.8 ml LV SV MOD A4C:     130.0 ml LV SV MOD BP:      72.4 ml RIGHT VENTRICLE RV S prime:     7.62 cm/s LEFT ATRIUM             Index LA diam:        4.70 cm 2.20 cm/m LA Vol (A2C):   71.8 ml 33.64 ml/m LA Vol (A4C):   86.1 ml 40.34 ml/m LA Biplane Vol: 82.7 ml 38.75 ml/m  AORTIC VALVE LVOT Vmax:   72.20 cm/s LVOT Vmean:  54.400 cm/s LVOT VTI:    0.147 m  AORTA Ao Root diam: 3.40 cm Ao Asc diam:  4.00 cm MITRAL VALVE                TRICUSPID VALVE MV Area (PHT): 3.25 cm     TR Peak grad:   24.8 mmHg MV Decel Time: 234 msec     TR Vmax:        249.00 cm/s MV E velocity: 117.00 cm/s                             SHUNTS                             Systemic VTI:  0.15 m                             Systemic Diam: 2.50 cm Kirk Ruths MD Electronically signed by Kirk Ruths MD Signature Date/Time: 06/04/2022/2:38:17 PM    Final    CARDIAC CATHETERIZATION  Result Date: 06/03/2022 Multivessel CAD with mild-moderate left main stenosis, moderate LAD stenosis, severe ramus stenosis, severe PLA stenosis S/P CABG with continued graft patency as outlined Normal LVEDP Med Rx. No culprit for progressive angina - stable anatomy from prior cath studies. Start apixaban tomorrow for new onset afib. Likely DC tomorrow if stable.   DG Chest 2 View  Result Date: 06/03/2022 CLINICAL DATA:  Chest pain, shortness of breath. EXAM: CHEST - 2 VIEW COMPARISON:  06/08/2021. FINDINGS: Intact median sternotomy wires. Normal heart size. Stable mediastinal with postoperative changes of median sternotomy and CABG. Clear lungs. No pleural effusion or pneumothorax. Reverse right shoulder arthroplasty. Surgical clips project over the upper abdomen. IMPRESSION: No evidence of acute cardiopulmonary disease. Electronically Signed   By: Emmit Alexanders M.D.   On: 06/03/2022 11:42    Assessment and Plan:   Pancreas masses CT chest 06/05/2022-10 mm right hilar node, 9 mm central right upper lobe nodule, 2 contiguous lobulated masses in  the central abdomen, left nephrectomy MRI abdomen 129 24-2 adjacent hypervascular masses in the sacral pancreas with associated ductal dilatation and atrophy, probable prominent lymph node superior to the right mass, the right-sided mass causes mass effect at  the celiac axis and portal vein 2.   Remote history of renal cell carcinoma, status post a left nephrectomy 3.   Atrial fibrillation 4.   Coronary artery disease, coronary artery bypass surgery in 2020 5.   History of CVA 6.   Multiple spine surgeries 7.   Left upper lobe nodule and borderline enlarged right hilar node on chest CT 06/05/2022 8.   Gout 9.   History of adenomatous polyps  Mr. Kerce was admitted with dyspnea and chest pain.  A CT chest and cardiac catheterization did not reveal the nature of the pain.  The pain has resolved.  He was found to have upper abdominal masses associated with the pancreas on a chest CT and MRI of the abdomen.  Mr. Ozburn has a remote history of renal cell carcinoma (we do not have the pathology report available today).  I discussed the differential diagnosis with Mr. Nodal and his wife.  The pancreas mass is could be related to primary pancreas cancer, recurrent renal cell carcinoma, metastatic disease from another site, or a benign process.  I reviewed the images and discussed the case with interventional radiology.  They are unable to perform a percutaneous biopsy.  I recommend GI consultation for an upper endoscopy/EUS biopsy of the abdominal masses.  Outpatient follow-up will be scheduled at the Cancer center.  Recommendations: Consult gastroenterology for an upper endoscopy and EUS biopsy of a pancreas mass Postbiopsy anticoagulation per the cardiology service Outpatient follow-up will be scheduled at the Cancer center  Betsy Coder, MD 06/07/2022, 4:01 PM

## 2022-06-07 NOTE — Consult Note (Signed)
New Hematology/Oncology Consult   Requesting MD: Lauree Chandler        Reason for Consult: Abdominal masses  HPI: Jake Dunn was diagnosed with renal cell carcinoma approximately 20 years ago and underwent a left nephrectomy.  He was admitted on 06/03/2022 with a 1 week history of dyspnea and chest pain. Cardiac catheterization with no explanation for angina found.  He was found to have atrial fibrillation.  Chest x-ray showed no acute change.  Jake Dunn was referred for a CT of the chest on 05/28/2022.  There was no evidence of pulmonary embolism.  Was a 9 mm smoothly marginated central right upper lobe nodule.  2 contiguous lobulated masses were seen in central abdomen measuring 6.2 and 4.8 cm.  These masses are partially included in the field-of-view.  An MRI of the abdomen on 06/06/2022 revealed no liver lesion.  2 adjacent mass lesions were noted at the pancreas with the largest lesion centered at the pancreatic neck with abutment of the celiac and portal vein.  A second lesion near the tail of pancreas measuring 4.4 x 3.4 cm there is pancreas tail atrophy with focal duct dilatation.  Spleen is not enlarged.  Left nephrectomy with no clear abnormal soft tissue in the surgical bed.  A soft tissue nodule is seen at the superior margin of the larger pancreas lesion  Jake Dunn denies abdominal pain.  He has nausea, but no emesis.   Past Medical History:  Diagnosis Date   Adenomatous polyp    Anxiety    Arthritis    "knees, ankles, shoulders" (08/15/2016)   CAD (coronary artery disease)    s/p cath in 2014 showing significant stenosis in the diagonal side branches and in the RV marginal branch. These vessels were small and diffusely diseased. He is managed medically. 08/16/16 Cath, no disease progression   Chronic lower back pain    Disc disease, degenerative, cervical    GERD (gastroesophageal reflux disease)    Gout    Hyperlipidemia    Hypertension    Osteoarthritis    Renal  cell carcinoma    left kidney removal   Stroke [facility])    MINI STROKE 15+ YRS AGO, no residual   Stroke ([place])    "I've had 2 or 3"; denies residual on 08/15/2016   Syncope and collapse   :   Past Surgical History:  Procedure Laterality Date   ANTERIOR CERVICAL DECOMP/DISCECTOMY FUSION  02/2004; 04/2005   [name] 09/20/2010; [name] 10/30/7626   APPLICATION OF ROBOTIC ASSISTANCE FOR SPINAL PROCEDURE N/A 01/02/2018   Procedure: APPLICATION OF ROBOTIC ASSISTANCE FOR SPINAL PROCEDURE;  Surgeon: Kristeen Miss, MD;  Location: [place];  Service: Neurosurgery;  Laterality: N/A;   BACK SURGERY     CARDIAC CATHETERIZATION  12/14/2008   ef 50-55%. SHOWED SIGNIFICANT STENOSIS AND TO DIAGONAL SIDE BRANCHES AND IN THE RIGHT VENTRICULAR MARGINAL BRANCH. THESE VESSELS WERE SMALL AND DIFFUSELY DISEASED   CARDIOVASCULAR STRESS TEST  12/10/2009   EF 61%. EKG negative. Mild peri ischemia. Managed medically.    CARPAL TUNNEL RELEASE Right 11/2005   [name] 09/20/2010   CARPAL TUNNEL RELEASE Left 06/2007   [name] 5/132012   COLONOSCOPY  11/2004   [name] 09/20/2010   COLONOSCOPY W/ BIOPSIES AND POLYPECTOMY  09/2002   [name] 09/20/2010   CORONARY ARTERY BYPASS GRAFT N/A 04/02/2019   Procedure: CORONARY ARTERY BYPASS GRAFTING (CABG) using LIMA to LAD; Endoscopically harvested right greater saphenous vein to the PLB, Ramus, and Diag 2.;  Surgeon: Melodie Bouillon  O, MD;  Location: [place];  Service: Open Heart Surgery;  Laterality: N/A;   INCISION AND DRAINAGE ABSCESS Right 10/18/2016   Procedure: INCISION AND DRAINAGE ABSCESS RIGHT SHOULDER;  Surgeon: [name], MD;  Location: [place];  Service: Orthopedics;  Laterality: Right;   INCISION AND DRAINAGE OF WOUND Right 08/2005   shoulder/notes 09/20/2010   IR FLUORO GUIDE CV LINE LEFT  10/19/2016   IR US GUIDE VASC ACCESS LEFT  10/19/2016   IRRIGATION AND DEBRIDEMENT SHOULDER Right 12/22/2016   Procedure: IRRIGATION AND DEBRIDEMENT RIGHT SHOULDER;   Surgeon: [name], MD;  Location: [place];  Service: Orthopedics;  Laterality: Right;   JOINT REPLACEMENT     LEFT HEART CATH AND CORONARY ANGIOGRAPHY N/A 08/16/2016   Procedure: Left Heart Cath and Coronary Angiography;  Surgeon: [name], MD;  Location: [place] CV LAB;  Service: Cardiovascular;  Laterality: N/A;   LEFT HEART CATH AND CORONARY ANGIOGRAPHY N/A 03/26/2019   Procedure: LEFT HEART CATH AND CORONARY ANGIOGRAPHY;  Surgeon: Martinique, Peter M, MD;  Location: [place] CV [facility];  Service: Cardiovascular;  Laterality: N/A;   LEFT HEART CATH AND CORS/GRAFTS ANGIOGRAPHY N/A 12/21/2020   Procedure: LEFT HEART CATH AND CORS/GRAFTS ANGIOGRAPHY;  Surgeon: Martinique, Peter M, MD;  Location: [place] [facility];  Service: Cardiovascular;  Laterality: N/A;   LEFT HEART CATH AND CORS/GRAFTS ANGIOGRAPHY N/A 06/03/2022   Procedure: LEFT HEART CATH AND CORS/GRAFTS ANGIOGRAPHY;  Surgeon: [name], MD;  Location: [place] [facility];  Service: Cardiovascular;  Laterality: N/A;   LEFT HEART CATHETERIZATION WITH CORONARY ANGIOGRAM N/A 08/09/2012   Procedure: LEFT HEART CATHETERIZATION WITH CORONARY ANGIOGRAM;  Surgeon: Peter M Martinique, MD;  Location: [facility] C[facility];  Service: Cardiovascular;  Laterality: N/A;   LUMBAR FUSION     NEPHRECTOMY Left early 2000s   REPLACEMENT TOTAL KNEE Right 08/2008   [name] 09/07/2010   SHOULDER ARTHROSCOPY WITH ROTATOR CUFF REPAIR Right 06/2005   [name] 09/20/2010   TOTAL KNEE ARTHROPLASTY  06/08/2011   Procedure: TOTAL KNEE ARTHROPLASTY;  Surgeon: [name], MD;  Location: [place];  Service: Orthopedics;  Laterality: Left;   TOTAL SHOULDER ARTHROPLASTY Right 04/06/2016   Procedure: RIGHT REVERSE TOTAL SHOULDER ARTHROPLASTY;  Surgeon: [name], MD;  Location: [place];  Service: Orthopedics;  Laterality: Right;   TOTAL SHOULDER REVISION Right 12/22/2016   Procedure: RIGHT SHOULDER GLENOID AND HUMERAL COMPONENT REVISION;  Surgeon: [name], MD;   Location: [place];  Service: Orthopedics;  Laterality: Right;   US ECHOCARDIOGRAPHY  12/15/2008   EF 60-65%   US ECHOCARDIOGRAPHY  09/28/2004   EF 55-60%  :   Current Facility-Administered Medications:    0.9 %  sodium chloride infusion, 250 mL, Intravenous, PRN, [name], MD   acetaminophen (TYLENOL) tablet 650 mg, 650 mg, Oral, Q4H PRN, [name], MD   allopurinol (ZYLOPRIM) tablet 300 mg, 300 mg, Oral, Daily PRN, [name], MD   aspirin EC tablet 81 mg, 81 mg, Oral, Daily, Donato Heinz, MD, 81 mg at 06/07/22 0955   atorvastatin (LIPITOR) tablet 80 mg, 80 mg, Oral, Daily, [name], MD, 80 mg at 06/07/22 7510   HYDROcodone-acetaminophen (NORCO/VICODIN) 5-325 MG per tablet 1 tablet, 1 tablet, Oral, TID PRN, [name], MD, 1 tablet at 06/07/22 0951   LORazepam (ATIVAN) tablet 0.5 mg, 0.5 mg, Oral, BID PRN, [name], MD, 0.5 mg at 06/06/22 1003   metoprolol tartrate (LOPRESSOR) tablet 25 mg, 25 mg, Oral,  BID, [name], MD, 25 mg at 06/07/22 0951   ondansetron [facility]) injection 4 mg, 4 mg, Intravenous, Q6H PRN, [name], MD, 4 mg at 06/04/22 0623   Oral care mouth rinse, 15 mL, Mouth Rinse, PRN, Burnell Blanks, MD   pantoprazole (PROTONIX) EC tablet 40 mg, 40 mg, Oral, QPM, [name], MD, 40 mg at 06/06/22 1737   ranolazine (RANEXA) 12 hr tablet 1,000 mg, 1,000 mg, Oral, BID, Arnoldo Lenis, MD, 1,000 mg at 06/07/22 0951   sodium chloride flush (NS) 0.9 % injection 3 mL, 3 mL, Intravenous, Q12H, [name], MD, 3 mL at 06/07/22 7628   sodium chloride flush (NS) 0.9 % injection 3 mL, 3 mL, Intravenous, PRN, [name], MD:   aspirin EC  81 mg Oral Daily   atorvastatin  80 mg Oral Daily   metoprolol tartrate  25 mg Oral BID   pantoprazole  40 mg Oral QPM   ranolazine  1,000 mg Oral BID   sodium chloride flush  3 mL Intravenous Q12H  :   Allergies  Allergen Reactions   Latex Rash    Other  reaction(s): Hives/Skin Rash/Itching   Tape Rash    Paper tape okay Other reaction(s): Hives/Rash/Itching  :  FH: Pancreas cancer, a brother had lung cancer, 3 sisters had lung cancer  SOCIAL HISTORY: He lives with his wife in [place].  He does not smoke cigarettes.  He used alcohol in the past.  He worked as a Psychologist, sport and exercise and now Actuary  Review of Systems:  Positives include: Chest pain and dyspnea for 1 week prior to hospital admission, nausea  A complete ROS was otherwise negative.   Physical Exam:  Blood pressure 110/65, pulse 68, temperature 97.7 F (36.5 C), temperature source Oral, resp. rate 17, height '5\' 7"'$  (1.702 m), weight 227 lb 1.6 oz (103 kg), SpO2 97 %.  HEENT: Oral cavity without visible mass, upper and lower denture plate Lungs: Clear bilaterally, no respiratory distress Cardiac: Regular rate and rhythm Abdomen: Soft, no mass, no hepatosplenomegaly, mild tenderness in the right lower abdomen GU: Testes without mass, right scrotal cyst, left epididymis cyst? Vascular: No leg edema Lymph nodes: No cervical, supraclavicular, axillary, or inguinal nodes Neurologic: Alert and oriented, the motor exam appears intact in the upper extremities bilaterally.  4+/5 strength with dorsiflexion of the right foot Skin: No rash Musculoskeletal: No spine tenderness  LABS:   Recent Labs    06/07/22 0836  WBC 6.3  HGB 12.0*  HCT 35.4*  PLT 205     Recent Labs    06/05/22 0200 06/07/22 0836  NA 136 136  K 4.1 4.2  CL 106 105  CO2 23 23  GLUCOSE 109* 100*  BUN 20 20  CREATININE 1.35* 1.31*  CALCIUM 8.8* 8.9      RADIOLOGY:  MR ABDOMEN W WO CONTRAST  Result Date: 06/06/2022 CLINICAL DATA:  Central mesenteric mass on CT of the chest EXAM: MRI ABDOMEN WITHOUT AND WITH CONTRAST TECHNIQUE: Multiplanar multisequence MR imaging of the abdomen was performed both before and after the administration of intravenous contrast. CONTRAST:  34m GADAVIST  GADOBUTROL 1 MMOL/ML IV SOLN COMPARISON:  CT angiogram chest 06/05/2022 FINDINGS: Lower chest: Significant breathing motion noted along several sequences. Grossly there is no pleural effusion at the lung bases. The heart is enlarged. Status post median sternotomy. Hepatobiliary: Gallbladder is distended. No obvious filling defect. No biliary ductal dilatation. No space-occupying liver lesion. No areas of hypervascularity. Patent portal vein.  Pancreas: As seen on the CT there are 2 adjacent mass lesions identified along the course of the pancreas. The larger of the 2 is centered more along the pancreatic neck midline. Cephalocaudal length of 4.5 cm and axial on series 4 image 24 measuring 6.5 5.5 cm. This abuts the celiac, portal vein slight mass effect. These vessels are not encased. More affected by mass effect. Just adjacent to this towards the tail is a second lesion that is smaller. On series 4, image 24 axial plane this measures 4.4 by 3.4 cm. Cephalocaudal length of 3.4 cm. There is associated atrophy of the tail with focal ductal dilatation of against the mass lesion. Both these lesions are with restricted diffusion and clear enhancement on the dynamic postcontrast. Spleen is nonenlarged. No abnormal enhancement or T2 signal. There is some susceptibility artifact along the margin of the spleen posteriorly. Spleen: Spleen is nonenlarged. No abnormal enhancement. There are some susceptibility artifact along the posterior aspect of the spleen from adjacent clips. Adrenals/Urinary Tract: Right adrenal gland is preserved. Left is not well seen. There are surgical changes from left nephrectomy. Multiple areas of susceptibility artifact are identified from the surgical clips as seen on CT scan. No clear abnormal soft tissue in the surgical bed. Right kidney has a Bosniak 1 posterior small renal cyst on series 4, image 28 measuring 11 mm. Few punctate foci elsewhere. No specific imaging follow up alone. Perinephric  stranding Stomach/Bowel: Stomach is underdistended. Visualized small and large bowel in the abdomen is nondilated. Vascular/Lymphatic: Atherosclerotic changes seen along the abdominal aorta and branch vessels. Normal caliber IVC. Along the superior margin of the larger pancreatic lesion is a soft tissue nodule immediately abutting the lesion superiorly as seen on series 3, image 34 at 2.7 x 1.9 cm in the coronal plane and transverse 2.7 cm. This could be an adjacent lymph node. Other:  Motion. Musculoskeletal: Susceptibility artifact from the hardware along the spine. Scattered degenerative changes. IMPRESSION: Two adjacent aggressive appearing hypervascular masses in the central pancreas with associated ductal dilatation and atrophy. Additional probable prominent lymph node superiorly on the right. The mass lesions do cause mass effect along the adjacent celiac axis and portal vein. Grossly these vessels are patent. No additional separate mass lesion including no specific liver lesion. Surgical changes from left nephrectomy. Overall taken to account the appearance today as well as the patient's history of nephrectomy there is differential to the findings in the pancreas. These very well could represent renal cell metastatic deposits. Neuroendocrine process is possible. These do not have the typical appearance of adenocarcinoma of the pancreas. Overall recommend further workup including consideration of sampling. Electronically Signed   By: Jill Side M.D.   On: 06/06/2022 17:07   CT Angio Chest Pulmonary Embolism (PE) W or WO Contrast  Result Date: 06/05/2022 CLINICAL DATA:  Pulmonary embolism (PE) suspected, high prob Chest pain. EXAM: CT ANGIOGRAPHY CHEST WITH CONTRAST TECHNIQUE: Multidetector CT imaging of the chest was performed using the standard protocol during bolus administration of intravenous contrast. Multiplanar CT image reconstructions and MIPs were obtained to evaluate the vascular anatomy.  RADIATION DOSE REDUCTION: This exam was performed according to the departmental dose-optimization program which includes automated exposure control, adjustment of the mA and/or kV according to patient size and/or use of iterative reconstruction technique. CONTRAST:  65m OMNIPAQUE IOHEXOL 350 MG/ML SOLN COMPARISON:  Radiograph 06/03/2022 FINDINGS: Cardiovascular: There are no filling defects within the pulmonary arteries to suggest pulmonary embolus. The heart is enlarged. Post  CABG with calcification of native coronary arteries. Fusiform aneurysmal dilatation of the ascending aorta at 4.5 cm. No periaortic stranding. There is no contrast in the aorta to assess for dissection. No pericardial effusion. Mediastinum/Nodes: Borderline 10 mm right hilar node series 5, image 50. Small mediastinal lymph nodes without additional mediastinal nodal enlargement. No visible thyroid nodule. Tiny hiatal hernia. No esophageal wall thickening. Lungs/Pleura: Moderate central bronchial thickening. Mild emphysema. Subpleural reticulation and ground-glass without apical basilar gradient. Benign calcified granuloma in the right upper lobe, needing no further follow-up. Smoothly marginated 11 x 7 mm (9 mm mean dimension) nodule in the central right upper lobe series 7, image 41. No significant pleural effusion. Upper Abdomen: 2 contiguous lobulated masses are seen in the central abdomen, 6.2 cm and 4.8 cm both on series 5, image 135. These may be related to the pancreas, however are only partially included in the field of view. Calcified granuloma in the liver. Left nephrectomy changes are partially included. Musculoskeletal: There are no acute or suspicious osseous abnormalities. Review of the MIP images confirms the above findings. IMPRESSION: 1. No pulmonary embolus. 2. Smoothly marginated 11 x 7 mm nodule in the central right upper lobe. Per Fleischner Society Guidelines, consider a non-contrast Chest CT at 3 months, a PET/CT, or  tissue sampling. These guidelines do not apply to immunocompromised patients and patients with cancer. Follow up in patients with significant comorbidities as clinically warranted. For lung cancer screening, adhere to Lung-RADS guidelines. Reference: Radiology. 2017; 284(1):228-43. 3. Cardiomegaly, post CABG. 4. Fusiform aneurysmal dilatation of the ascending aorta at 4.5 cm. Ascending thoracic aortic aneurysm. Recommend semi-annual imaging followup by CTA or MRA and referral to cardiothoracic surgery if not already obtained. This recommendation follows 2010 ACCF/AHA/AATS/ACR/ASA/SCA/SCAI/SIR/STS/SVM Guidelines for the Diagnosis and Management of Patients With Thoracic Aortic Disease. Circulation. 2010; 121: O841-Y606. Aortic aneurysm NOS (ICD10-I71.9) 5. Two contiguous lobulated masses in the central abdomen, 6.2 cm and 4.8 cm, may be related to the pancreas, however are only partially included in the field of view. This is incompletely characterized on this chest CTA. Workup for further assessment of potential pancreatic lesions would include pancreatic protocol MRI (if patient is able to tolerate breath hold technique and in absence of contraindication) or pancreatic protocol CT. Aortic Atherosclerosis (ICD10-I70.0) and Emphysema (ICD10-J43.9). Electronically Signed   By: Keith Rake M.D.   On: 06/05/2022 16:05   ECHOCARDIOGRAM COMPLETE  Result Date: 06/04/2022    ECHOCARDIOGRAM REPORT   Patient Name:   Jake Dunn Date of Exam: 06/04/2022 Medical Rec #:  [id number]      Height:       67.0 in Accession #:    [id number]     Weight:       227.1 lb Date of Birth:  1940/07/06      BSA:          2.134 m Patient Age:    82 years       BP:           107/64 mmHg Patient Gender: M              HR:           65 bpm. Exam Location:  Inpatient Procedure: 2D Echo, Cardiac Doppler, Color Doppler and Intracardiac            Opacification Agent Indications:    Chest Pain R07.9  History:        Patient has prior history  of Echocardiogram examinations, most  recent 03/30/2019. Stroke; Risk Factors:Hypertension and                 Dyslipidemia.  Sonographer:    Darlina Sicilian RDCS Referring Phys: [place]  1. Pt in atrial fibrillation at time of study.  2. Left ventricular ejection fraction, by estimation, is 50 to 55%. The left ventricle has low normal function. The left ventricle has no regional wall motion abnormalities. The left ventricular internal cavity size was mildly dilated. There is mild concentric left ventricular hypertrophy. Left ventricular diastolic parameters are indeterminate.  3. Right ventricular systolic function is normal. The right ventricular size is normal. There is normal pulmonary artery systolic pressure.  4. Left atrial size was mildly dilated.  5. Right atrial size was mildly dilated.  6. The mitral valve is grossly normal. Mild mitral valve regurgitation. No evidence of mitral stenosis.  7. The aortic valve is tricuspid. There is mild calcification of the aortic valve. Aortic valve regurgitation is not visualized. Aortic valve sclerosis is present, with no evidence of aortic valve stenosis.  8. Aortic dilatation noted. There is mild dilatation of the aortic root, measuring 40 mm.  9. The inferior vena cava is normal in size with greater than 50% respiratory variability, suggesting right atrial pressure of 3 mmHg. FINDINGS  Left Ventricle: Left ventricular ejection fraction, by estimation, is 50 to 55%. The left ventricle has low normal function. The left ventricle has no regional wall motion abnormalities. Definity contrast agent was given IV to delineate the left ventricular endocardial borders. The left ventricular internal cavity size was mildly dilated. There is mild concentric left ventricular hypertrophy. Left ventricular diastolic function could not be evaluated due to atrial fibrillation. Left ventricular diastolic parameters are indeterminate. Right  Ventricle: The right ventricular size is normal. Right ventricular systolic function is normal. There is normal pulmonary artery systolic pressure. The tricuspid regurgitant velocity is 2.49 m/s, and with an assumed right atrial pressure of 3 mmHg,  the estimated right ventricular systolic pressure is 97.9 mmHg. Left Atrium: Left atrial size was mildly dilated. Right Atrium: Right atrial size was mildly dilated. Pericardium: There is no evidence of pericardial effusion. Mitral Valve: The mitral valve is grossly normal. Mild mitral annular calcification. Mild mitral valve regurgitation. No evidence of mitral valve stenosis. Tricuspid Valve: The tricuspid valve is grossly normal. Tricuspid valve regurgitation is mild . No evidence of tricuspid stenosis. Aortic Valve: The aortic valve is tricuspid. There is mild calcification of the aortic valve. Aortic valve regurgitation is not visualized. Aortic valve sclerosis is present, with no evidence of aortic valve stenosis. Pulmonic Valve: The pulmonic valve was grossly normal. Pulmonic valve regurgitation is trivial. No evidence of pulmonic stenosis. Aorta: Aortic dilatation noted. There is mild dilatation of the aortic root, measuring 40 mm. Venous: The inferior vena cava is normal in size with greater than 50% respiratory variability, suggesting right atrial pressure of 3 mmHg. IAS/Shunts: No atrial level shunt detected by color flow Doppler. Additional Comments: Pt in atrial fibrillation at time of study.  LEFT VENTRICLE PLAX 2D LVIDd:         5.70 cm      Diastology LVIDs:         4.80 cm      LV e' medial:    6.90 cm/s LV PW:         1.30 cm      LV E/e' medial:  17.0 LV IVS:        1.30  cm      LV e' lateral:   9.89 cm/s LVOT diam:     2.50 cm      LV E/e' lateral: 11.8 LV SV:         72 LV SV Index:   34 LVOT Area:     4.91 cm  LV Volumes (MOD) LV vol d, MOD A2C: 90.8 ml LV vol d, MOD A4C: 130.0 ml LV vol s, MOD A2C: 28.0 ml LV vol s, MOD A4C: 65.3 ml LV SV MOD A2C:      62.8 ml LV SV MOD A4C:     130.0 ml LV SV MOD BP:      72.4 ml RIGHT VENTRICLE RV S prime:     7.62 cm/s LEFT ATRIUM             Index LA diam:        4.70 cm 2.20 cm/m LA Vol (A2C):   71.8 ml 33.64 ml/m LA Vol (A4C):   86.1 ml 40.34 ml/m LA Biplane Vol: 82.7 ml 38.75 ml/m  AORTIC VALVE LVOT Vmax:   72.20 cm/s LVOT Vmean:  54.400 cm/s LVOT VTI:    0.147 m  AORTA Ao Root diam: 3.40 cm Ao Asc diam:  4.00 cm MITRAL VALVE                TRICUSPID VALVE MV Area (PHT): 3.25 cm     TR Peak grad:   24.8 mmHg MV Decel Time: 234 msec     TR Vmax:        249.00 cm/s MV E velocity: 117.00 cm/s                             SHUNTS                             Systemic VTI:  0.15 m                             Systemic Diam: 2.50 cm Kirk Ruths MD Electronically signed by Kirk Ruths MD Signature Date/Time: 06/04/2022/2:38:17 PM    Final    CARDIAC CATHETERIZATION  Result Date: 06/03/2022 Multivessel CAD with mild-moderate left main stenosis, moderate LAD stenosis, severe ramus stenosis, severe PLA stenosis S/P CABG with continued graft patency as outlined Normal LVEDP Med Rx. No culprit for progressive angina - stable anatomy from prior cath studies. Start apixaban tomorrow for new onset afib. Likely DC tomorrow if stable.   DG Chest 2 View  Result Date: 06/03/2022 CLINICAL DATA:  Chest pain, shortness of breath. EXAM: CHEST - 2 VIEW COMPARISON:  06/08/2021. FINDINGS: Intact median sternotomy wires. Normal heart size. Stable mediastinal with postoperative changes of median sternotomy and CABG. Clear lungs. No pleural effusion or pneumothorax. Reverse right shoulder arthroplasty. Surgical clips project over the upper abdomen. IMPRESSION: No evidence of acute cardiopulmonary disease. Electronically Signed   By: Emmit Alexanders M.D.   On: 06/03/2022 11:42    Assessment and Plan:   Pancreas masses CT chest 06/05/2022-10 mm right hilar node, 9 mm central right upper lobe nodule, 2 contiguous lobulated masses in  the central abdomen, left nephrectomy MRI abdomen 129 24-2 adjacent hypervascular masses in the sacral pancreas with associated ductal dilatation and atrophy, probable prominent lymph node superior to the right mass, the right-sided mass causes mass effect at  the celiac axis and portal vein 2.   Remote history of renal cell carcinoma, status post a left nephrectomy 3.   Atrial fibrillation 4.   Coronary artery disease, coronary artery bypass surgery in 2020 5.   History of CVA 6.   Multiple spine surgeries 7.   Left upper lobe nodule and borderline enlarged right hilar node on chest CT 06/05/2022 8.   Gout 9.   History of adenomatous polyps  Jake Dunn was admitted with dyspnea and chest pain.  A CT chest and cardiac catheterization did not reveal the nature of the pain.  The pain has resolved.  He was found to have upper abdominal masses associated with the pancreas on a chest CT and MRI of the abdomen.  Jake Dunn has a remote history of renal cell carcinoma (we do not have the pathology report available today).  I discussed the differential diagnosis with Mr. Imbert and his wife.  The pancreas mass is could be related to primary pancreas cancer, recurrent renal cell carcinoma, metastatic disease from another site, or a benign process.  I reviewed the images and discussed the case with interventional radiology.  They are unable to perform a percutaneous biopsy.  I recommend GI consultation for an upper endoscopy/EUS biopsy of the abdominal masses.  Outpatient follow-up will be scheduled at the Cancer center.  Recommendations: Consult gastroenterology for an upper endoscopy and EUS biopsy of a pancreas mass Postbiopsy anticoagulation per the cardiology service Outpatient follow-up will be scheduled at the Cancer center  Betsy Coder, MD 06/07/2022, 4:01 PM

## 2022-06-07 NOTE — Plan of Care (Signed)
  Problem: Cardiovascular: Goal: Ability to achieve and maintain adequate cardiovascular perfusion will improve Outcome: Progressing Goal: Vascular access site(s) Level 0-1 will be maintained Outcome: Progressing   Problem: Education: Goal: Knowledge of General Education information will improve Description: Including pain rating scale, medication(s)/side effects and non-pharmacologic comfort measures Outcome: Progressing   Problem: Health Behavior/Discharge Planning: Goal: Ability to manage health-related needs will improve Outcome: Progressing   Problem: Pain Managment: Goal: General experience of comfort will improve Outcome: Progressing   Problem: Safety: Goal: Ability to remain free from injury will improve Outcome: Progressing   Problem: Skin Integrity: Goal: Risk for impaired skin integrity will decrease Outcome: Progressing

## 2022-06-08 ENCOUNTER — Encounter: Payer: Self-pay | Admitting: *Deleted

## 2022-06-08 ENCOUNTER — Other Ambulatory Visit: Payer: Self-pay | Admitting: Gastroenterology

## 2022-06-08 ENCOUNTER — Telehealth: Payer: Self-pay | Admitting: Physician Assistant

## 2022-06-08 DIAGNOSIS — I25118 Atherosclerotic heart disease of native coronary artery with other forms of angina pectoris: Secondary | ICD-10-CM | POA: Diagnosis not present

## 2022-06-08 DIAGNOSIS — N1831 Chronic kidney disease, stage 3a: Secondary | ICD-10-CM | POA: Insufficient documentation

## 2022-06-08 DIAGNOSIS — I4891 Unspecified atrial fibrillation: Secondary | ICD-10-CM | POA: Diagnosis not present

## 2022-06-08 DIAGNOSIS — D649 Anemia, unspecified: Secondary | ICD-10-CM | POA: Insufficient documentation

## 2022-06-08 DIAGNOSIS — I712 Thoracic aortic aneurysm, without rupture, unspecified: Secondary | ICD-10-CM | POA: Insufficient documentation

## 2022-06-08 DIAGNOSIS — R911 Solitary pulmonary nodule: Secondary | ICD-10-CM | POA: Insufficient documentation

## 2022-06-08 DIAGNOSIS — R1906 Epigastric swelling, mass or lump: Secondary | ICD-10-CM | POA: Diagnosis not present

## 2022-06-08 DIAGNOSIS — I5032 Chronic diastolic (congestive) heart failure: Secondary | ICD-10-CM | POA: Insufficient documentation

## 2022-06-08 MED ORDER — APIXABAN 5 MG PO TABS: 5.0000 mg | ORAL_TABLET | Freq: Two times a day (BID) | ORAL | 5 refills | Status: DC

## 2022-06-08 MED ORDER — RANOLAZINE ER 1000 MG PO TB12: 1000.0000 mg | ORAL_TABLET | Freq: Two times a day (BID) | ORAL | 5 refills | Status: DC

## 2022-06-08 MED ORDER — ASPIRIN 81 MG PO TBEC: DELAYED_RELEASE_TABLET | ORAL | Status: DC

## 2022-06-08 MED ORDER — APIXABAN 5 MG PO TABS
5.0000 mg | ORAL_TABLET | Freq: Two times a day (BID) | ORAL | Status: DC
  Administered 2022-06-08: 5 mg via ORAL
  Filled 2022-06-08: qty 1

## 2022-06-08 MED ORDER — FUROSEMIDE 20 MG PO TABS: 20.0000 mg | ORAL_TABLET | Freq: Every day | ORAL | 1 refills | Status: DC | PRN

## 2022-06-08 NOTE — Progress Notes (Unsigned)
Oncology Discharge Planning Note  [facility] [place] Address: [address], [place], [place] [address] Hours of Operation:  Nena Polio, Monday - Friday  Clinic Contact Information:  [phone]  Oncology Care Team: Medical Oncologist:  Benay Spice  Patient Details: Name:  Jake Dunn, Warehime MRN:   [id number] DOB:   02-18-1941 Reason for Current Admission: '@PPROB'$ @  Discharge Planning Narrative: Notification of admission received by Inpatient team for [facility].  Discharge follow-up appointments for oncology are current and available on the AVS and MyChart.   Upon discharge from the hospital, hematology/oncology's post discharge plan of care for the outpatient setting is: June 16, 2022 at 2:15 pm [facility] [place] [address] [place] will be called within two business days after discharge to review hematology/oncology's plan of care for full understanding.    Outpatient Oncology Specific Care Only: Oncology appointment transportation needs addressed?:  no Oncology medication management for symptom management addressed?:  no Chemo Alert Card reviewed?:  not applicable Immunotherapy Alert Card reviewed?:  not applicable

## 2022-06-08 NOTE — Discharge Summary (Addendum)
Discharge Summary    Patient ID: Jake Dunn MRN: [id number]; DOB: 04/20/1941  Admit date: 06/03/2022 Discharge date: 06/08/2022  PCP:  Harlan Stains, MD   [facility] Cardiologist:  Peter Martinique, MD        Discharge Diagnoses    Principal Problem:   Unstable angina [facility]) Active Problems:   Hypertension   CAD (coronary artery disease)   Hyperlipidemia   PAF (paroxysmal atrial fibrillation) (HCC)   Abdominal mass   Lung nodule   Thoracic aortic aneurysm (HCC)   Mild anemia   Chronic diastolic CHF (congestive heart failure) (HCC)   Stage 3a chronic kidney disease (CKD) ([place])    Diagnostic Studies/Procedures    2D echo 06/04/22    1. Pt in atrial fibrillation at time of study.   2. Left ventricular ejection fraction, by estimation, is 50 to 55%. The  left ventricle has low normal function. The left ventricle has no regional  wall motion abnormalities. The left ventricular internal cavity size was  mildly dilated. There is mild  concentric left ventricular hypertrophy. Left ventricular diastolic  parameters are indeterminate.   3. Right ventricular systolic function is normal. The right ventricular  size is normal. There is normal pulmonary artery systolic pressure.   4. Left atrial size was mildly dilated.   5. Right atrial size was mildly dilated.   6. The mitral valve is grossly normal. Mild mitral valve regurgitation.  No evidence of mitral stenosis.   7. The aortic valve is tricuspid. There is mild calcification of the  aortic valve. Aortic valve regurgitation is not visualized. Aortic valve  sclerosis is present, with no evidence of aortic valve stenosis.   8. Aortic dilatation noted. There is mild dilatation of the aortic root,  measuring 40 mm.   9. The inferior vena cava is normal in size with greater than 50%  respiratory variability, suggesting right atrial pressure of 3 mmHg.   Cath 06/03/22 Multivessel CAD with mild-moderate  left main stenosis, moderate LAD stenosis, severe ramus stenosis, severe PLA stenosis S/P CABG with continued graft patency as outlined Normal LVEDP   Med Rx. No culprit for progressive angina - stable anatomy from prior cath studies. Start apixaban tomorrow for new onset afib. Likely DC tomorrow if stable.    See radiology/EMR for imaging studies _____________   History of Present Illness     Jake Dunn is a 82 y.o. male with CAD s/p CABG 2020 with post-op Afib (on short course of amiodarone/Eliquis) GERD, HTN, HLD, chronic diastolic CHF, suspected CKD stage 3a, CVA, renal cell carcinoma s/p left nephrectomy, spinal stenosis s/p multiple operations who presented to the hospital 06/03/2022 with waxing/waning chest pain similar to prior angina for several days. Due to persistent symptoms including DOE he came to the ER. Troponins were negative though symptoms concerning for angina so patient admitted to cardiology service for plan for echo and cardiac cath. He was also noted to be in atrial fib, rate controlled, with plan to pursue DOAC following cath.  Hospital Course     1. CAD/Chest pain/SOB: history of prior CABG with LIMA-LAD, SVG-rPLA, SVG-RI/D2 in 2020.  Presented with chest pain. Trops neg, EKG no acute ischemic changes.  Echo LVEF 50-55%, no WMAs, indet diastolic, normal RV.  Cath: LM 50% and stable, diffuse mod LAD disease mid 65%, D2 90%, ramus 90%LCX 80%, RCA prox 45%, RPL 80%. Patent LIMA-D2, SVG-ramus, SVG-RPL. LVEDP 8 at cath.  Stable anatomy from prior Studies, no  culprit for progressive angina. - started on Ranexa '500mg'$  bid as additional antianginal given soft bp's limit other options, increased to 1000 mg twice daily -He reports no chest pain since 1/28.  Dr. Gardiner Rhyme suspects chest pain may have been related to A-fib, converted to sinus rhythm on 1/28 and denies any chest pain since that time. ASA '81mg'$  was stopped due to addition of Eliquis, though Dr. Gardiner Rhyme does suggest to  restart baby aspirin while off Eliquis for upper endo/EUS as below. He can stop aspirin pnce more when he goes back on Eliquis post-procedure. I personally reviewed with patient who verbalized understanding. We did outline this on his medicine list so that his team on Monday is aware he was to take aspirin while off Eliquis.  2. Abdominal masses: given symptoms, CT to rule out PE was obtained on 06/05/22 incidentally noting 2 contiguous lobulated masses in central abdomen on CTPA, incompletely visualized.  Pancreatic protocol MRI was done, which showed 2 adjacent aggressive appearing hypervascular masses and central pancreas, suspected renal cell metastasis versus neuroendocrine process.  Biopsy recommended -Dr. Gardiner Rhyme discussed results with Dr. Benay Spice in oncology. Dr. Benay Spice felt the pancreas mass is could be related to primary pancreas cancer, recurrent renal cell carcinoma, metastatic disease from another site, or a benign process. He reviewed the images and discussed the case with interventional radiology and indicated they are unable to perform a percutaneous biopsy. He recommended GI consult for upper endoscopy/EUS biopsy. Dr. Gardiner Rhyme discussed with Dr. Watt Climes with [facility] who indicated there was no availability for EGD/EUS until next week - they have scheduled him for procedure with Dr. Paulita Fujita on 2/5. He will need to hold Eliquis 48 hours beforehand per MD, and Dr. Gardiner Rhyme wants him to take a baby aspirin while off Eliquis. - will route note to both Dr. Judie Petit and Dr. Benay Spice to request they ensure patient gets follow-up. Patient also advised to contact their offices if he has not heard back within 2-3 days.  3. Lung nodule: 11 x 7 mm nodule in the central right upper lobe. Per radiology recommendation, consider noncontrasted CT at 3 months, PET/CT, or tissue sampling. Will defer follow-up to oncology team who will be seeing patient as outpatient.  4. Thoracic aortic aneurysm: Ascending  aorta measured 4.5 cm by CTA.  Will need arrangement of outpatient monitoring at follow-up with recommendation for semi-annual follow-up. Remains on BB. Aneurysm precautions outlined on AVS. Discussed with pt.  5. Paroxysmal afib: history of afib in 2020 post CABG, initially placed on Amiodarone and Eliquis, subsequently discontinued in January 2021. No significant recurrence until now. - spontaneously converted to sinus rhythm on 1/28 - continue rate control - started On Eliquis '5mg'$  bid, recommendations above  6. Mild anemia - Hgb 12.0 today, no bleeding reported - recommend recheck as OP  7. Chronic diastolic CHF - no volume issues noted this admission - Dr. Gardiner Rhyme suggests Lasix be resumed as PRN only for weight gain 3lb in day or 5lb in week (was on hold this admission)  8. CKD stage 3a - baseline Cr appears variable from 1.1-1.4 range, at 1.31 reasonably stable since admission - will need OP monitoring given Eliquis   Dr. Gardiner Rhyme has seen and examined the patient today and feels he is stable for discharge. He has an appt in March with Dr. Martinique that we will keep but Dr. Gardiner Rhyme also suggested arranging post hospital f/u within 2 weeks so has APP appointment on 2/13.     Did the  patient have an acute coronary syndrome (MI, NSTEMI, STEMI, etc) this admission?:  No                               Did the patient have a percutaneous coronary intervention (stent / angioplasty)?:  No.          _____________  Discharge Vitals Blood pressure 127/79, pulse 63, temperature 98.1 F (36.7 C), temperature source Oral, resp. rate 16, height '5\' 7"'$  (1.702 m), weight 103 kg, SpO2 95 %.  Filed Weights   06/03/22 1847  Weight: 103 kg    Labs & Radiologic Studies    CBC Recent Labs    06/07/22 0836  WBC 6.3  HGB 12.0*  HCT 35.4*  MCV 96.5  PLT 509   Basic Metabolic Panel Recent Labs    06/07/22 0836  NA 136  K 4.2  CL 105  CO2 23  GLUCOSE 100*  BUN 20  CREATININE  1.31*  CALCIUM 8.9   Liver Function Tests No results for input(s): "AST", "ALT", "ALKPHOS", "BILITOT", "PROT", "ALBUMIN" in the last 72 hours. No results for input(s): "LIPASE", "AMYLASE" in the last 72 hours. High Sensitivity Troponin:   Recent Labs  Lab 06/03/22 1103 06/03/22 1259  TROPONINIHS 13 13    BNP Invalid input(s): "POCBNP" D-Dimer No results for input(s): "DDIMER" in the last 72 hours. Hemoglobin A1C No results for input(s): "HGBA1C" in the last 72 hours. Fasting Lipid Panel No results for input(s): "CHOL", "HDL", "LDLCALC", "TRIG", "CHOLHDL", "LDLDIRECT" in the last 72 hours. Thyroid Function Tests No results for input(s): "TSH", "T4TOTAL", "T3FREE", "THYROIDAB" in the last 72 hours.  Invalid input(s): "FREET3" _____________  MR ABDOMEN W WO CONTRAST  Result Date: 06/06/2022 CLINICAL DATA:  Central mesenteric mass on CT of the chest EXAM: MRI ABDOMEN WITHOUT AND WITH CONTRAST TECHNIQUE: Multiplanar multisequence MR imaging of the abdomen was performed both before and after the administration of intravenous contrast. CONTRAST:  35m GADAVIST GADOBUTROL 1 MMOL/ML IV SOLN COMPARISON:  CT angiogram chest 06/05/2022 FINDINGS: Lower chest: Significant breathing motion noted along several sequences. Grossly there is no pleural effusion at the lung bases. The heart is enlarged. Status post median sternotomy. Hepatobiliary: Gallbladder is distended. No obvious filling defect. No biliary ductal dilatation. No space-occupying liver lesion. No areas of hypervascularity. Patent portal vein. Pancreas: As seen on the CT there are 2 adjacent mass lesions identified along the course of the pancreas. The larger of the 2 is centered more along the pancreatic neck midline. Cephalocaudal length of 4.5 cm and axial on series 4 image 24 measuring 6.5 5.5 cm. This abuts the celiac, portal vein slight mass effect. These vessels are not encased. More affected by mass effect. Just adjacent to this  towards the tail is a second lesion that is smaller. On series 4, image 24 axial plane this measures 4.4 by 3.4 cm. Cephalocaudal length of 3.4 cm. There is associated atrophy of the tail with focal ductal dilatation of against the mass lesion. Both these lesions are with restricted diffusion and clear enhancement on the dynamic postcontrast. Spleen is nonenlarged. No abnormal enhancement or T2 signal. There is some susceptibility artifact along the margin of the spleen posteriorly. Spleen: Spleen is nonenlarged. No abnormal enhancement. There are some susceptibility artifact along the posterior aspect of the spleen from adjacent clips. Adrenals/Urinary Tract: Right adrenal gland is preserved. Left is not well seen. There are surgical changes from left nephrectomy.  Multiple areas of susceptibility artifact are identified from the surgical clips as seen on CT scan. No clear abnormal soft tissue in the surgical bed. Right kidney has a Bosniak 1 posterior small renal cyst on series 4, image 28 measuring 11 mm. Few punctate foci elsewhere. No specific imaging follow up alone. Perinephric stranding Stomach/Bowel: Stomach is underdistended. Visualized small and large bowel in the abdomen is nondilated. Vascular/Lymphatic: Atherosclerotic changes seen along the abdominal aorta and branch vessels. Normal caliber IVC. Along the superior margin of the larger pancreatic lesion is a soft tissue nodule immediately abutting the lesion superiorly as seen on series 3, image 34 at 2.7 x 1.9 cm in the coronal plane and transverse 2.7 cm. This could be an adjacent lymph node. Other:  Motion. Musculoskeletal: Susceptibility artifact from the hardware along the spine. Scattered degenerative changes. IMPRESSION: Two adjacent aggressive appearing hypervascular masses in the central pancreas with associated ductal dilatation and atrophy. Additional probable prominent lymph node superiorly on the right. The mass lesions do cause mass effect  along the adjacent celiac axis and portal vein. Grossly these vessels are patent. No additional separate mass lesion including no specific liver lesion. Surgical changes from left nephrectomy. Overall taken to account the appearance today as well as the patient's history of nephrectomy there is differential to the findings in the pancreas. These very well could represent renal cell metastatic deposits. Neuroendocrine process is possible. These do not have the typical appearance of adenocarcinoma of the pancreas. Overall recommend further workup including consideration of sampling. Electronically Signed   By: Jill Side M.D.   On: 06/06/2022 17:07   CT Angio Chest Pulmonary Embolism (PE) W or WO Contrast  Result Date: 06/05/2022 CLINICAL DATA:  Pulmonary embolism (PE) suspected, high prob Chest pain. EXAM: CT ANGIOGRAPHY CHEST WITH CONTRAST TECHNIQUE: Multidetector CT imaging of the chest was performed using the standard protocol during bolus administration of intravenous contrast. Multiplanar CT image reconstructions and MIPs were obtained to evaluate the vascular anatomy. RADIATION DOSE REDUCTION: This exam was performed according to the departmental dose-optimization program which includes automated exposure control, adjustment of the mA and/or kV according to patient size and/or use of iterative reconstruction technique. CONTRAST:  52m OMNIPAQUE IOHEXOL 350 MG/ML SOLN COMPARISON:  Radiograph 06/03/2022 FINDINGS: Cardiovascular: There are no filling defects within the pulmonary arteries to suggest pulmonary embolus. The heart is enlarged. Post CABG with calcification of native coronary arteries. Fusiform aneurysmal dilatation of the ascending aorta at 4.5 cm. No periaortic stranding. There is no contrast in the aorta to assess for dissection. No pericardial effusion. Mediastinum/Nodes: Borderline 10 mm right hilar node series 5, image 50. Small mediastinal lymph nodes without additional mediastinal nodal  enlargement. No visible thyroid nodule. Tiny hiatal hernia. No esophageal wall thickening. Lungs/Pleura: Moderate central bronchial thickening. Mild emphysema. Subpleural reticulation and ground-glass without apical basilar gradient. Benign calcified granuloma in the right upper lobe, needing no further follow-up. Smoothly marginated 11 x 7 mm (9 mm mean dimension) nodule in the central right upper lobe series 7, image 41. No significant pleural effusion. Upper Abdomen: 2 contiguous lobulated masses are seen in the central abdomen, 6.2 cm and 4.8 cm both on series 5, image 135. These may be related to the pancreas, however are only partially included in the field of view. Calcified granuloma in the liver. Left nephrectomy changes are partially included. Musculoskeletal: There are no acute or suspicious osseous abnormalities. Review of the MIP images confirms the above findings. IMPRESSION: 1. No pulmonary embolus.  2. Smoothly marginated 11 x 7 mm nodule in the central right upper lobe. Per Fleischner Society Guidelines, consider a non-contrast Chest CT at 3 months, a PET/CT, or tissue sampling. These guidelines do not apply to immunocompromised patients and patients with cancer. Follow up in patients with significant comorbidities as clinically warranted. For lung cancer screening, adhere to Lung-RADS guidelines. Reference: Radiology. 2017; 284(1):228-43. 3. Cardiomegaly, post CABG. 4. Fusiform aneurysmal dilatation of the ascending aorta at 4.5 cm. Ascending thoracic aortic aneurysm. Recommend semi-annual imaging followup by CTA or MRA and referral to cardiothoracic surgery if not already obtained. This recommendation follows 2010 ACCF/AHA/AATS/ACR/ASA/SCA/SCAI/SIR/STS/SVM Guidelines for the Diagnosis and Management of Patients With Thoracic Aortic Disease. Circulation. 2010; 121: W119-J478. Aortic aneurysm NOS (ICD10-I71.9) 5. Two contiguous lobulated masses in the central abdomen, 6.2 cm and 4.8 cm, may be  related to the pancreas, however are only partially included in the field of view. This is incompletely characterized on this chest CTA. Workup for further assessment of potential pancreatic lesions would include pancreatic protocol MRI (if patient is able to tolerate breath hold technique and in absence of contraindication) or pancreatic protocol CT. Aortic Atherosclerosis (ICD10-I70.0) and Emphysema (ICD10-J43.9). Electronically Signed   By: Keith Rake M.D.   On: 06/05/2022 16:05   ECHOCARDIOGRAM COMPLETE  Result Date: 06/04/2022    ECHOCARDIOGRAM REPORT   Patient Name:   LARREN COPES Date of Exam: 06/04/2022 Medical Rec #:  [id number]      Height:       67.0 in Accession #:    [id number]     Weight:       227.1 lb Date of Birth:  05-07-1941      BSA:          2.134 m Patient Age:    81 years       BP:           107/64 mmHg Patient Gender: M              HR:           65 bpm. Exam Location:  Inpatient Procedure: 2D Echo, Cardiac Doppler, Color Doppler and Intracardiac            Opacification Agent Indications:    Chest Pain R07.9  History:        Patient has prior history of Echocardiogram examinations, most                 recent 03/30/2019. Stroke; Risk Factors:Hypertension and                 Dyslipidemia.  Sonographer:    Darlina Sicilian RDCS Referring Phys: Angus  1. Pt in atrial fibrillation at time of study.  2. Left ventricular ejection fraction, by estimation, is 50 to 55%. The left ventricle has low normal function. The left ventricle has no regional wall motion abnormalities. The left ventricular internal cavity size was mildly dilated. There is mild concentric left ventricular hypertrophy. Left ventricular diastolic parameters are indeterminate.  3. Right ventricular systolic function is normal. The right ventricular size is normal. There is normal pulmonary artery systolic pressure.  4. Left atrial size was mildly dilated.  5. Right atrial size was mildly dilated.   6. The mitral valve is grossly normal. Mild mitral valve regurgitation. No evidence of mitral stenosis.  7. The aortic valve is tricuspid. There is mild calcification of the aortic valve. Aortic valve regurgitation is not visualized. Aortic valve sclerosis is  present, with no evidence of aortic valve stenosis.  8. Aortic dilatation noted. There is mild dilatation of the aortic root, measuring 40 mm.  9. The inferior vena cava is normal in size with greater than 50% respiratory variability, suggesting right atrial pressure of 3 mmHg. FINDINGS  Left Ventricle: Left ventricular ejection fraction, by estimation, is 50 to 55%. The left ventricle has low normal function. The left ventricle has no regional wall motion abnormalities. Definity contrast agent was given IV to delineate the left ventricular endocardial borders. The left ventricular internal cavity size was mildly dilated. There is mild concentric left ventricular hypertrophy. Left ventricular diastolic function could not be evaluated due to atrial fibrillation. Left ventricular diastolic parameters are indeterminate. Right Ventricle: The right ventricular size is normal. Right ventricular systolic function is normal. There is normal pulmonary artery systolic pressure. The tricuspid regurgitant velocity is 2.49 m/s, and with an assumed right atrial pressure of 3 mmHg,  the estimated right ventricular systolic pressure is 93.7 mmHg. Left Atrium: Left atrial size was mildly dilated. Right Atrium: Right atrial size was mildly dilated. Pericardium: There is no evidence of pericardial effusion. Mitral Valve: The mitral valve is grossly normal. Mild mitral annular calcification. Mild mitral valve regurgitation. No evidence of mitral valve stenosis. Tricuspid Valve: The tricuspid valve is grossly normal. Tricuspid valve regurgitation is mild . No evidence of tricuspid stenosis. Aortic Valve: The aortic valve is tricuspid. There is mild calcification of the aortic  valve. Aortic valve regurgitation is not visualized. Aortic valve sclerosis is present, with no evidence of aortic valve stenosis. Pulmonic Valve: The pulmonic valve was grossly normal. Pulmonic valve regurgitation is trivial. No evidence of pulmonic stenosis. Aorta: Aortic dilatation noted. There is mild dilatation of the aortic root, measuring 40 mm. Venous: The inferior vena cava is normal in size with greater than 50% respiratory variability, suggesting right atrial pressure of 3 mmHg. IAS/Shunts: No atrial level shunt detected by color flow Doppler. Additional Comments: Pt in atrial fibrillation at time of study.  LEFT VENTRICLE PLAX 2D LVIDd:         5.70 cm      Diastology LVIDs:         4.80 cm      LV e' medial:    6.90 cm/s LV PW:         1.30 cm      LV E/e' medial:  17.0 LV IVS:        1.30 cm      LV e' lateral:   9.89 cm/s LVOT diam:     2.50 cm      LV E/e' lateral: 11.8 LV SV:         72 LV SV Index:   34 LVOT Area:     4.91 cm  LV Volumes (MOD) LV vol d, MOD A2C: 90.8 ml LV vol d, MOD A4C: 130.0 ml LV vol s, MOD A2C: 28.0 ml LV vol s, MOD A4C: 65.3 ml LV SV MOD A2C:     62.8 ml LV SV MOD A4C:     130.0 ml LV SV MOD BP:      72.4 ml RIGHT VENTRICLE RV S prime:     7.62 cm/s LEFT ATRIUM             Index LA diam:        4.70 cm 2.20 cm/m LA Vol (A2C):   71.8 ml 33.64 ml/m LA Vol (A4C):   86.1 ml 40.34 ml/m LA  Biplane Vol: 82.7 ml 38.75 ml/m  AORTIC VALVE LVOT Vmax:   72.20 cm/s LVOT Vmean:  54.400 cm/s LVOT VTI:    0.147 m  AORTA Ao Root diam: 3.40 cm Ao Asc diam:  4.00 cm MITRAL VALVE                TRICUSPID VALVE MV Area (PHT): 3.25 cm     TR Peak grad:   24.8 mmHg MV Decel Time: 234 msec     TR Vmax:        249.00 cm/s MV E velocity: 117.00 cm/s                             SHUNTS                             Systemic VTI:  0.15 m                             Systemic Diam: 2.50 cm Kirk Ruths MD Electronically signed by Kirk Ruths MD Signature Date/Time: 06/04/2022/2:38:17 PM    Final     CARDIAC CATHETERIZATION  Result Date: 06/03/2022 Multivessel CAD with mild-moderate left main stenosis, moderate LAD stenosis, severe ramus stenosis, severe PLA stenosis S/P CABG with continued graft patency as outlined Normal LVEDP Med Rx. No culprit for progressive angina - stable anatomy from prior cath studies. Start apixaban tomorrow for new onset afib. Likely DC tomorrow if stable.   DG Chest 2 View  Result Date: 06/03/2022 CLINICAL DATA:  Chest pain, shortness of breath. EXAM: CHEST - 2 VIEW COMPARISON:  06/08/2021. FINDINGS: Intact median sternotomy wires. Normal heart size. Stable mediastinal with postoperative changes of median sternotomy and CABG. Clear lungs. No pleural effusion or pneumothorax. Reverse right shoulder arthroplasty. Surgical clips project over the upper abdomen. IMPRESSION: No evidence of acute cardiopulmonary disease. Electronically Signed   By: Emmit Alexanders M.D.   On: 06/03/2022 11:42   Disposition   Pt is being discharged home today in good condition.  Follow-up Plans & Appointments     Follow-up Information     Ladell Pier, MD Follow up.   Specialty: Oncology Why: The oncology team will be calling you to arrange a follow-up visit. Please call them if you have not heard back within 3 business days. Contact information: [place] [address] [phone]         Arta Silence, MD Follow up.   Specialty: Gastroenterology Why: [facility] Gastroenterology team will be contacting you to arrange upper endoscopy/endoscopic ultrasound with biopsy on February 5th. Please call their office if you do not hear back from them by Friday morning. Contact information: [address] [place] [address] [phone]         Ledora Bottcher, [place] Follow up.   Specialties: Cardiology, Radiology Why: [facility] location - cardiology follow-up has been arranged on Tuesday Jun 21, 2022 10:55 AM  (Arrive by 10:40 AM). Angie is one of our PAs that works with Dr. Martinique. We will keep your follow-up as scheduled in March with Dr. Martinique for now. Contact information: [address] [place] [address] [phone]                Discharge Instructions     Diet - low sodium heart healthy  Complete by: As directed    Discharge instructions   Complete by: As directed    IMPORTANT MEDICATION CHANGES:  - You are being started on a new blood thinner called Eliquis/apixaban. The GI team has indicated you will need to be off of this for 48 hours prior to their procedure on Monday 2/5 so do not take any Eliquis/apixaban on Saturday 2/3, Sunday 2/4, or the day of your procedure on 2/5. If for some reason your procedure gets rescheduled, please discuss with GI when to stop this medicine.  - You will no longer be on aspirin long-term since you are being started on Eliquis/apixaban, but Dr. Gardiner Rhyme does want you to take '81mg'$  once a day when Eliquis/apixaban is stopped for your GI procedure/biopsy. When you go back on Eliquis/apixaban, you will stop aspirin.  - You were also started on a new medicine called ranolazine for chest pain.  - Dr. Gardiner Rhyme changed your Lasix/furosemide prescription to as-needed only for weight gain of 3 pounds in a day or 5 pounds in a week.  INFORMATION ABOUT YOUR ANEURYSM One of your tests has shown an aneurysm of your aorta. The word "aneurysm" refers to a bulge in an artery (blood vessel). Most people think of them in the context of an emergency, but yours was found incidentally. At this point there is nothing you need to do from a procedure standpoint, but there are some important things to keep in mind for day-to-day life.  Mainstays of therapy for aneurysms include very good blood pressure control, healthy lifestyle, and avoiding tobacco products and street drugs. Research has raised concern that antibiotics in the fluoroquinolone class could  be associated with increased risk of having an aneurysm develop or tear. This includes medicines that end in "floxacin," like Cipro or Levaquin. Make sure to discuss this information with other healthcare providers if you require antibiotics.  Since aneurysms can run in families, you should discuss your diagnosis with first degree relatives as they may need to be screened for this. Regular mild-moderate physical exercise is important, but avoid heavy lifting/weight lifting over 30lbs, chopping wood, shoveling snow or digging heavy earth with a shovel. It is best to avoid activities that cause grunting or straining (medically referred to as a "Valsalva maneuver"). This happens when a person bears down against a closed throat to increase the strength of arm or abdominal muscles. There's often a tendency to do this when lifting heavy weights, doing sit-ups, push-ups or chin-ups, etc., but it may be harmful.  This is a finding I would expect to be monitored periodically by your cardiology team. Most unruptured thoracic aortic aneurysms cause no symptoms, so they are often found during exams for other conditions. Contact a health care provider if you develop any discomfort in your upper back, neck, abdomen, trouble swallowing, cough or hoarseness, or unexplained weight loss. Get help right away if you develop severe pain in your upper back or abdomen that may move into your chest and arms, or any other concerning symptoms such as shortness of breath or fever.   Increase activity slowly   Complete by: As directed    No driving for 2 days. No lifting over 5 lbs for 1 week. No sexual activity for 1 week. Keep procedure site clean & dry. If you notice increased pain, swelling, bleeding or pus, call/return!  You may shower, but no soaking baths/hot tubs/pools for 1 week.        Discharge Medications   Allergies as of  06/08/2022       Reactions   Latex Rash   Other reaction(s): Hives/Skin Rash/Itching   Tape  Rash   Paper tape okay Other reaction(s): Hives/Rash/Itching        Medication List     TAKE these medications    acyclovir 400 MG tablet Commonly known as: ZOVIRAX Take 400 mg by mouth 3 (three) times daily as needed (For cold sores). Marland Kitchen   allopurinol 300 MG tablet Commonly known as: ZYLOPRIM Take 300 mg by mouth daily as needed (for gout flares).   apixaban 5 MG Tabs tablet Commonly known as: ELIQUIS Take 1 tablet (5 mg total) by mouth 2 (two) times daily.   aspirin EC 81 MG tablet You will no longer be on aspirin since you are being started on Eliquis/apixaban, but Dr. Gardiner Rhyme does want you to take '81mg'$  once daily when Eliquis/apixaban is held for your GI procedure/biopsy. When you go back on Eliquis/apixaban, you will stop aspirin. What changed:  how much to take how to take this when to take this additional instructions   atorvastatin 80 MG tablet Commonly known as: LIPITOR Take 1 tablet (80 mg total) by mouth daily.   furosemide 20 MG tablet Commonly known as: LASIX Take 1 tablet (20 mg total) by mouth daily as needed (weight gain of 3 pounds in one day or 5 pounds in a week). What changed:  how much to take how to take this when to take this reasons to take this additional instructions   HYDROcodone-acetaminophen 5-325 MG tablet Commonly known as: NORCO/VICODIN Take 1 tablet by mouth 3 (three) times daily as needed for moderate pain.   LORazepam 0.5 MG tablet Commonly known as: ATIVAN Take 1 tablet (0.5 mg total) by mouth 2 (two) times daily as needed for anxiety.   metoprolol tartrate 25 MG tablet Commonly known as: LOPRESSOR Take 25 mg by mouth 2 (two) times daily.   nitroGLYCERIN 0.4 MG SL tablet Commonly known as: NITROSTAT Place 1 tablet (0.4 mg total) under the tongue every 5 (five) minutes as needed for chest pain.   pantoprazole 40 MG tablet Commonly known as: PROTONIX Take 40 mg by mouth every evening.   ranolazine 1000 MG SR  tablet Commonly known as: RANEXA Take 1 tablet (1,000 mg total) by mouth 2 (two) times daily.   Trelegy Ellipta 100-62.5-25 MCG/ACT Aepb Generic drug: Fluticasone-Umeclidin-Vilant Inhale 1 puff into the lungs daily. What changed:  when to take this reasons to take this           Outstanding Labs/Studies   N/a, consider f/u CBC  Duration of Discharge Encounter   Greater than 30 minutes including physician time.  Signed, Charlie Pitter, PA-C 06/08/2022, 10:26 AM

## 2022-06-08 NOTE — Telephone Encounter (Addendum)
   Patient is being discharged today per Dr. Gardiner Rhyme who spoke with GI team/Dr. Watt Climes and they plan outpatient upper endo/EUS on Monday 2/5 with Dr. Paulita Fujita. Patient instructed to hold his Eliquis beginning 2/3; Dr. Gardiner Rhyme wants him to take baby ASA while off Eliquis which was relayed to patient. He can stop ASA when he goes back on Eliquis. Otherwise we will defer to GI to contact patient with procedure instructions. GI and oncology plan to arrange OP follow-up for patient as well. I will forward to Dr. Judie Petit and Dr. Benay Spice to ensure pt gets scheduled. Thank you!

## 2022-06-08 NOTE — Progress Notes (Signed)
Rounding Note    Patient Name: Jake Dunn Date of Encounter: 06/08/2022  [place] Cardiologist: Peter Martinique, MD   Subjective   Denies any chest pain this morning.  Inpatient Medications    Scheduled Meds:  aspirin EC  81 mg Oral Daily   atorvastatin  80 mg Oral Daily   metoprolol tartrate  25 mg Oral BID   pantoprazole  40 mg Oral QPM   ranolazine  1,000 mg Oral BID   sodium chloride flush  3 mL Intravenous Q12H   Continuous Infusions:  sodium chloride     PRN Meds: sodium chloride, acetaminophen, allopurinol, HYDROcodone-acetaminophen, LORazepam, ondansetron (ZOFRAN) IV, mouth rinse, sodium chloride flush   Vital Signs    Vitals:   06/07/22 1337 06/07/22 2025 06/08/22 0332 06/08/22 0752  BP: 110/65 120/76 116/76 127/79  Pulse: 68 66 62 63  Resp: 17     Temp: 97.7 F (36.5 C) 98.2 F (36.8 C) 97.8 F (36.6 C) 98.1 F (36.7 C)  TempSrc: Oral Oral Oral Oral  SpO2: 97% 94% 94% 95%  Weight:      Height:        Intake/Output Summary (Last 24 hours) at 06/08/2022 0845 Last data filed at 06/07/2022 2300 Gross per 24 hour  Intake 243 ml  Output --  Net 243 ml       06/03/2022    6:47 PM 02/07/2022    7:56 AM 08/10/2021    7:54 AM  Last 3 Weights  Weight (lbs) 227 lb 1.6 oz 228 lb 224 lb  Weight (kg) 103.012 kg 103.42 kg 101.606 kg      Telemetry    NSR  - Personally Reviewed  ECG    N/a - Personally Reviewed  Physical Exam   GEN: No acute distress.   Neck: No JVD Cardiac: RRR, no murmurs Respiratory: Clear to auscultation bilaterally. GI: Soft, nontender, non-distended  MS: No edema; No deformity. Neuro:  Nonfocal  Psych: Normal affect   Labs    High Sensitivity Troponin:   Recent Labs  Lab 06/03/22 1103 06/03/22 1259  TROPONINIHS 13 13      Chemistry Recent Labs  Lab 06/04/22 0953 06/05/22 0200 06/07/22 0836  NA 137 136 136  K 4.1 4.1 4.2  CL 104 106 105  CO2 21* 23 23  GLUCOSE 124* 109* 100*  BUN '18 20  20  '$ CREATININE 1.37* 1.35* 1.31*  CALCIUM 8.9 8.8* 8.9  PROT  --  5.8*  --   ALBUMIN  --  3.2*  --   AST  --  13*  --   ALT  --  15  --   ALKPHOS  --  72  --   BILITOT  --  0.5  --   GFRNONAA 52* 53* 55*  ANIONGAP '12 7 8     '$ Lipids No results for input(s): "CHOL", "TRIG", "HDL", "LABVLDL", "LDLCALC", "CHOLHDL" in the last 168 hours.  Hematology Recent Labs  Lab 06/03/22 1103 06/04/22 0953 06/07/22 0836  WBC 7.8 6.3 6.3  RBC 4.06* 4.13* 3.67*  HGB 13.2 13.3 12.0*  HCT 38.9* 40.3 35.4*  MCV 95.8 97.6 96.5  MCH 32.5 32.2 32.7  MCHC 33.9 33.0 33.9  RDW 14.6 14.8 14.7  PLT 240 220 205    Thyroid  Recent Labs  Lab 06/05/22 0200  TSH 1.745     BNP Recent Labs  Lab 06/04/22 0953  BNP 244.9*     DDimer No results for input(s): "DDIMER"  in the last 168 hours.   Radiology    MR ABDOMEN W WO CONTRAST  Result Date: 06/06/2022 CLINICAL DATA:  Central mesenteric mass on CT of the chest EXAM: MRI ABDOMEN WITHOUT AND WITH CONTRAST TECHNIQUE: Multiplanar multisequence MR imaging of the abdomen was performed both before and after the administration of intravenous contrast. CONTRAST:  29m GADAVIST GADOBUTROL 1 MMOL/ML IV SOLN COMPARISON:  CT angiogram chest 06/05/2022 FINDINGS: Lower chest: Significant breathing motion noted along several sequences. Grossly there is no pleural effusion at the lung bases. The heart is enlarged. Status post median sternotomy. Hepatobiliary: Gallbladder is distended. No obvious filling defect. No biliary ductal dilatation. No space-occupying liver lesion. No areas of hypervascularity. Patent portal vein. Pancreas: As seen on the CT there are 2 adjacent mass lesions identified along the course of the pancreas. The larger of the 2 is centered more along the pancreatic neck midline. Cephalocaudal length of 4.5 cm and axial on series 4 image 24 measuring 6.5 5.5 cm. This abuts the celiac, portal vein slight mass effect. These vessels are not encased. More  affected by mass effect. Just adjacent to this towards the tail is a second lesion that is smaller. On series 4, image 24 axial plane this measures 4.4 by 3.4 cm. Cephalocaudal length of 3.4 cm. There is associated atrophy of the tail with focal ductal dilatation of against the mass lesion. Both these lesions are with restricted diffusion and clear enhancement on the dynamic postcontrast. Spleen is nonenlarged. No abnormal enhancement or T2 signal. There is some susceptibility artifact along the margin of the spleen posteriorly. Spleen: Spleen is nonenlarged. No abnormal enhancement. There are some susceptibility artifact along the posterior aspect of the spleen from adjacent clips. Adrenals/Urinary Tract: Right adrenal gland is preserved. Left is not well seen. There are surgical changes from left nephrectomy. Multiple areas of susceptibility artifact are identified from the surgical clips as seen on CT scan. No clear abnormal soft tissue in the surgical bed. Right kidney has a Bosniak 1 posterior small renal cyst on series 4, image 28 measuring 11 mm. Few punctate foci elsewhere. No specific imaging follow up alone. Perinephric stranding Stomach/Bowel: Stomach is underdistended. Visualized small and large bowel in the abdomen is nondilated. Vascular/Lymphatic: Atherosclerotic changes seen along the abdominal aorta and branch vessels. Normal caliber IVC. Along the superior margin of the larger pancreatic lesion is a soft tissue nodule immediately abutting the lesion superiorly as seen on series 3, image 34 at 2.7 x 1.9 cm in the coronal plane and transverse 2.7 cm. This could be an adjacent lymph node. Other:  Motion. Musculoskeletal: Susceptibility artifact from the hardware along the spine. Scattered degenerative changes. IMPRESSION: Two adjacent aggressive appearing hypervascular masses in the central pancreas with associated ductal dilatation and atrophy. Additional probable prominent lymph node superiorly on  the right. The mass lesions do cause mass effect along the adjacent celiac axis and portal vein. Grossly these vessels are patent. No additional separate mass lesion including no specific liver lesion. Surgical changes from left nephrectomy. Overall taken to account the appearance today as well as the patient's history of nephrectomy there is differential to the findings in the pancreas. These very well could represent renal cell metastatic deposits. Neuroendocrine process is possible. These do not have the typical appearance of adenocarcinoma of the pancreas. Overall recommend further workup including consideration of sampling. Electronically Signed   By: AJill SideM.D.   On: 06/06/2022 17:07    Cardiac Studies  Patient Profile     Jake Dunn is a 82 y.o. male with a hx of CAD s/p CABG, GERD, HTN, HLD, chronic diastolic CHF, CVA, renal cell carcinoma s/p left nephrectomy, spinal stenosis s/p multiple operations who is being seen for chest pain.    Assessment & Plan    CAD/Chest pain/SOB: history of prior CABG with LIMA-LAD, SVG-rPLA, SVG-RI/D2 in 2020.  Presented with chest pain. Trops neg, EKG no acute ischemic changes.  Echo LVEF 50-55%, no WMAs, indet diastolic, normal RV.  Cath: LM 50% and stable, diffuse mod LAD disease mid 65%, D2 90%, ramus 90%LCX 80%, RCA prox 45%, RPL 80%. Patent LIMA-D2, SVG-ramus, SVG-RPL. LVEDP 8 at cath.  Stable anatomy from prior Studies, no culprit for progressive angina. - started ranexa '500mg'$  bid as additional antianginal given soft bp's limit other options, increased to 1000 mg twice daily -He reports no chest pain since 1/28.  Suspect have been related to A-fib, converted to sinus rhythm on 1/28 and denies any chest pain since that time  Abdominal masses: 2 contiguous lobulated masses in central abdomen on CTPA, incompletely visualized.  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  Biopsy recommended -Discussed results with Dr. Benay Spice in oncology.  Recommends obtaining biopsy and then will follow-up with Dr. Benay Spice in oncology clinic.  Consulted  GI for EGD/EUS.  -D/w Dr Watt Climes in GI: no availability for EGD/EUS until next week.  Scheduled for procedure with Dr Paulita Fujita on 2/5.  Will need to hold Eliquis x 48 hours prior to procedure  Lung nodule: 11 x 7 mm nodule in the central right upper lobe.  CT chest at 3 months or PET/CT recommended  Aortic aneurysm: Ascending aorta measured 4.5 cm.  Will monitor   Afib: history of afib in 2020 post CABG, initially placed on Amiodarone and Eliquis, subsequently discontinued in January 2021. No significant recurrence until now - continue rate control - started eliquis '5mg'$  bid - spontaneously converted to sinus rhythm on 1/28  Disposition: OK for discharge today with plans for EGD/EUS on Monday.  Will need to hold Eliquis on Saturday and Sunday; with his coronary history, would suggest taking ASA 81 mg daily while off Eliquis.  Plan to discharge on Eliquis 5 mg twice daily, atorvastatin 80 mg daily, metoprolol 25 mg twice daily, Ranexa 1000 mg twice daily  -Diet heart healthy -DVT PPx: Eliquis -Code: Full    For questions or updates, please contact [place] Please consult [email or web address] for contact info under        Signed, Donato Heinz, MD  06/08/2022, 8:45 AM

## 2022-06-10 ENCOUNTER — Encounter (HOSPITAL_COMMUNITY): Payer: Self-pay | Admitting: Gastroenterology

## 2022-06-13 ENCOUNTER — Ambulatory Visit (HOSPITAL_BASED_OUTPATIENT_CLINIC_OR_DEPARTMENT_OTHER): Payer: Medicare Other | Admitting: Anesthesiology

## 2022-06-13 ENCOUNTER — Ambulatory Visit (HOSPITAL_COMMUNITY)
Admission: RE | Admit: 2022-06-13 | Discharge: 2022-06-13 | Disposition: A | Discharge status: Home / Self Care | Payer: Medicare Other | Attending: Gastroenterology | Admitting: Gastroenterology

## 2022-06-13 ENCOUNTER — Encounter (HOSPITAL_COMMUNITY)
Admission: RE | Disposition: A | Discharge status: Home / Self Care | Payer: Self-pay | Source: Home / Self Care | Attending: Gastroenterology

## 2022-06-13 ENCOUNTER — Other Ambulatory Visit: Payer: Self-pay

## 2022-06-13 ENCOUNTER — Encounter (HOSPITAL_COMMUNITY): Payer: Self-pay | Admitting: Gastroenterology

## 2022-06-13 ENCOUNTER — Ambulatory Visit (HOSPITAL_COMMUNITY): Payer: Medicare Other | Admitting: Anesthesiology

## 2022-06-13 DIAGNOSIS — R933 Abnormal findings on diagnostic imaging of other parts of digestive tract: Secondary | ICD-10-CM | POA: Diagnosis not present

## 2022-06-13 DIAGNOSIS — Z79899 Other long term (current) drug therapy: Secondary | ICD-10-CM | POA: Insufficient documentation

## 2022-06-13 DIAGNOSIS — E785 Hyperlipidemia, unspecified: Secondary | ICD-10-CM | POA: Insufficient documentation

## 2022-06-13 DIAGNOSIS — Z87891 Personal history of nicotine dependence: Secondary | ICD-10-CM | POA: Diagnosis not present

## 2022-06-13 DIAGNOSIS — C7889 Secondary malignant neoplasm of other digestive organs: Secondary | ICD-10-CM | POA: Diagnosis not present

## 2022-06-13 DIAGNOSIS — M109 Gout, unspecified: Secondary | ICD-10-CM | POA: Insufficient documentation

## 2022-06-13 DIAGNOSIS — E669 Obesity, unspecified: Secondary | ICD-10-CM | POA: Diagnosis not present

## 2022-06-13 DIAGNOSIS — Z800 Family history of malignant neoplasm of digestive organs: Secondary | ICD-10-CM | POA: Diagnosis not present

## 2022-06-13 DIAGNOSIS — I110 Hypertensive heart disease with heart failure: Secondary | ICD-10-CM | POA: Insufficient documentation

## 2022-06-13 DIAGNOSIS — C251 Malignant neoplasm of body of pancreas: Secondary | ICD-10-CM | POA: Diagnosis not present

## 2022-06-13 DIAGNOSIS — K8689 Other specified diseases of pancreas: Secondary | ICD-10-CM | POA: Diagnosis not present

## 2022-06-13 DIAGNOSIS — F419 Anxiety disorder, unspecified: Secondary | ICD-10-CM | POA: Insufficient documentation

## 2022-06-13 DIAGNOSIS — C259 Malignant neoplasm of pancreas, unspecified: Secondary | ICD-10-CM | POA: Insufficient documentation

## 2022-06-13 DIAGNOSIS — I2510 Atherosclerotic heart disease of native coronary artery without angina pectoris: Secondary | ICD-10-CM | POA: Insufficient documentation

## 2022-06-13 DIAGNOSIS — Z905 Acquired absence of kidney: Secondary | ICD-10-CM | POA: Diagnosis not present

## 2022-06-13 DIAGNOSIS — K219 Gastro-esophageal reflux disease without esophagitis: Secondary | ICD-10-CM | POA: Diagnosis not present

## 2022-06-13 DIAGNOSIS — M1990 Unspecified osteoarthritis, unspecified site: Secondary | ICD-10-CM | POA: Diagnosis not present

## 2022-06-13 DIAGNOSIS — Z85528 Personal history of other malignant neoplasm of kidney: Secondary | ICD-10-CM | POA: Diagnosis not present

## 2022-06-13 DIAGNOSIS — K869 Disease of pancreas, unspecified: Secondary | ICD-10-CM | POA: Diagnosis not present

## 2022-06-13 DIAGNOSIS — Z801 Family history of malignant neoplasm of trachea, bronchus and lung: Secondary | ICD-10-CM | POA: Diagnosis not present

## 2022-06-13 DIAGNOSIS — Z951 Presence of aortocoronary bypass graft: Secondary | ICD-10-CM | POA: Diagnosis not present

## 2022-06-13 DIAGNOSIS — Z6835 Body mass index (BMI) 35.0-35.9, adult: Secondary | ICD-10-CM | POA: Diagnosis not present

## 2022-06-13 DIAGNOSIS — I509 Heart failure, unspecified: Secondary | ICD-10-CM | POA: Diagnosis not present

## 2022-06-13 DIAGNOSIS — I25119 Atherosclerotic heart disease of native coronary artery with unspecified angina pectoris: Secondary | ICD-10-CM

## 2022-06-13 HISTORY — PX: FINE NEEDLE ASPIRATION: SHX5430

## 2022-06-13 HISTORY — PX: ESOPHAGOGASTRODUODENOSCOPY (EGD) WITH PROPOFOL: SHX5813

## 2022-06-13 HISTORY — PX: UPPER ESOPHAGEAL ENDOSCOPIC ULTRASOUND (EUS): SHX6562

## 2022-06-13 SURGERY — UPPER ESOPHAGEAL ENDOSCOPIC ULTRASOUND (EUS)
Anesthesia: Monitor Anesthesia Care

## 2022-06-13 MED ORDER — PROPOFOL 500 MG/50ML IV EMUL
INTRAVENOUS | Status: DC | PRN
  Administered 2022-06-13: 125 ug/kg/min via INTRAVENOUS

## 2022-06-13 MED ORDER — PROPOFOL 10 MG/ML IV BOLUS
INTRAVENOUS | Status: DC | PRN
  Administered 2022-06-13 (×4): 20 mg via INTRAVENOUS

## 2022-06-13 MED ORDER — APIXABAN 5 MG PO TABS: 5.0000 mg | ORAL_TABLET | Freq: Two times a day (BID) | ORAL | 5 refills | Status: DC

## 2022-06-13 MED ORDER — EPHEDRINE SULFATE-NACL 50-0.9 MG/10ML-% IV SOSY
PREFILLED_SYRINGE | INTRAVENOUS | Status: DC | PRN
  Administered 2022-06-13: 10 mg via INTRAVENOUS

## 2022-06-13 MED ORDER — PHENYLEPHRINE HCL (PRESSORS) 10 MG/ML IV SOLN
INTRAVENOUS | Status: AC
  Filled 2022-06-13: qty 1

## 2022-06-13 MED ORDER — LACTATED RINGERS IV SOLN: INTRAVENOUS | Status: DC | PRN

## 2022-06-13 MED ORDER — PHENYLEPHRINE HCL-NACL 20-0.9 MG/250ML-% IV SOLN
INTRAVENOUS | Status: DC | PRN
  Administered 2022-06-13: 50 ug/min via INTRAVENOUS

## 2022-06-13 MED ORDER — LIDOCAINE 2% (20 MG/ML) 5 ML SYRINGE
INTRAMUSCULAR | Status: DC | PRN
  Administered 2022-06-13: 100 mg via INTRAVENOUS

## 2022-06-13 MED ORDER — PHENYLEPHRINE 80 MCG/ML (10ML) SYRINGE FOR IV PUSH (FOR BLOOD PRESSURE SUPPORT)
PREFILLED_SYRINGE | INTRAVENOUS | Status: DC | PRN
  Administered 2022-06-13 (×2): 160 ug via INTRAVENOUS

## 2022-06-13 MED ORDER — PROPOFOL 1000 MG/100ML IV EMUL
INTRAVENOUS | Status: AC
  Filled 2022-06-13: qty 100

## 2022-06-13 SURGICAL SUPPLY — 15 items

## 2022-06-13 NOTE — Transfer of Care (Signed)
Immediate Anesthesia Transfer of Care Note  Patient: Jake Dunn  Procedure(s) Performed: UPPER ESOPHAGEAL ENDOSCOPIC ULTRASOUND (EUS) (Bilateral) ESOPHAGOGASTRODUODENOSCOPY (EGD) WITH PROPOFOL FINE NEEDLE ASPIRATION (FNA) LINEAR  Patient Location: Endoscopy Unit  Anesthesia Type:MAC  Level of Consciousness: awake and alert   Airway & Oxygen Therapy: Patient Spontanous Breathing and Patient connected to face mask oxygen  Post-op Assessment: Report given to RN and Post -op Vital signs reviewed and stable  Post vital signs: Reviewed and stable  Last Vitals:  Vitals Value Taken Time  BP    Temp    Pulse 61 06/13/22 0939  Resp 16 06/13/22 0939  SpO2 100 % 06/13/22 0939  Vitals shown include unvalidated device data.  Last Pain:  Vitals:   06/13/22 0730  TempSrc: Temporal  PainSc: 0-No pain         Complications: No notable events documented.

## 2022-06-13 NOTE — Interval H&P Note (Signed)
History and Physical Interval Note:  06/13/2022 8:31 AM  Jake Dunn  has presented today for surgery, with the diagnosis of pancreatic mass.  The various methods of treatment have been discussed with the patient and family. After consideration of risks, benefits and other options for treatment, the patient has consented to  Procedure(s): UPPER ESOPHAGEAL ENDOSCOPIC ULTRASOUND (EUS) (Bilateral) ESOPHAGOGASTRODUODENOSCOPY (EGD) WITH PROPOFOL (N/A) as a surgical intervention.  The patient's history has been reviewed, patient examined, no change in status, stable for surgery.  I have reviewed the patient's chart and labs.  Questions were answered to the patient's satisfaction.     Jake Dunn M  Pancreatic lesion(s) on CT scan, found incidentally.  Prior history of renal cell cancer.  Plan for endoscopic ultrasound with possible fine needle aspiration for further evaluation.  Eliquis has been on hold for 2 days before today's procedure.

## 2022-06-13 NOTE — Discharge Instructions (Signed)
YOU HAD AN ENDOSCOPIC PROCEDURE TODAY: Refer to the procedure report and other information in the discharge instructions given to you for any specific questions about what was found during the examination. If this information does not answer your questions, please call Dr. Gessner's office at [phone] to clarify.   YOU SHOULD EXPECT: Some feelings of bloating in the abdomen. Passage of more gas than usual. Walking can help get rid of the air that was put into your GI tract during the procedure and reduce the bloating. If you had a lower endoscopy (such as a colonoscopy or flexible sigmoidoscopy) you may notice spotting of blood in your stool or on the toilet paper. Some abdominal soreness may be present for a day or two, also.  DIET: Your first meal following the procedure should be a light meal and then it is ok to progress to your normal diet. A half-sandwich or bowl of soup is an example of a good first meal. Heavy or fried foods are harder to digest and may make you feel nauseous or bloated. Drink plenty of fluids but you should avoid alcoholic beverages for 24 hours.   ACTIVITY: Your care partner should take you home directly after the procedure. You should plan to take it easy, moving slowly for the rest of the day. You can resume normal activity the day after the procedure however YOU SHOULD NOT DRIVE, use power tools, machinery or perform tasks that involve climbing or major physical exertion for 24 hours (because of the sedation medicines used during the test).   SYMPTOMS TO REPORT IMMEDIATELY: A gastroenterologist can be reached at any hour. Please call [phone]  for any of the following symptoms:  Following lower endoscopy (colonoscopy, flexible sigmoidoscopy) Excessive amounts of blood in the stool  Significant tenderness, worsening of abdominal pains  Swelling of the abdomen that is new, acute  Fever of 100 or higher  Following upper endoscopy (EGD, EUS, ERCP, esophageal  dilation) Vomiting of blood or coffee ground material  New, significant abdominal pain  New, significant chest pain or pain under the shoulder blades  Painful or persistently difficult swallowing  New shortness of breath  Black, tarry-looking or red, bloody stools  FOLLOW UP:  If any biopsies were taken you will be contacted by phone or by letter within the next 1-3 weeks. Call [phone]  if you have not heard about the biopsies in 3 weeks.  Please also call with any specific questions about appointments or follow up tests. 

## 2022-06-13 NOTE — Anesthesia Preprocedure Evaluation (Signed)
Anesthesia Evaluation  Patient identified by MRN, date of birth, ID band Patient awake    Reviewed: Allergy & Precautions, H&P , NPO status , Patient's Chart, lab work & pertinent test results, reviewed documented beta blocker date and time   Airway Mallampati: I       Dental  (+) Teeth Intact, Dental Advisory Given, Edentulous Upper, Edentulous Lower   Pulmonary shortness of breath and with exertion, former smoker   Pulmonary exam normal breath sounds clear to auscultation       Cardiovascular hypertension, + angina  + CAD and +CHF  Normal cardiovascular exam Rhythm:Regular Rate:Normal  Echo 03/30/2019 1. Left ventricular ejection fraction, by visual estimation, is 55 to 60%. The left ventricle has normal function. There is no left ventricular hypertrophy.  2. Global right ventricle has mildly reduced systolic function.The right ventricular size is mildly enlarged. No increase in right ventricular wall thickness.  3. Left atrial size was normal.  4. Right atrial size was mildly dilated.  5. The mitral valve is normal in structure. No evidence of mitral valve regurgitation.  6. The tricuspid valve is normal in structure. Tricuspid valve regurgitation is trivial.  7. The aortic valve is normal in structure. Aortic valve regurgitation is not visualized. No evidence of aortic valve sclerosis or stenosis.  8. The pulmonic valve was grossly normal. Pulmonic valve regurgitation is not visualized.  9. Aortic dilatation noted. 10. There is mild dilatation of the ascending aorta measuring 38 mm. 11. Normal pulmonary artery systolic pressure. 12. The atrial septum is grossly normal.   Neuro/Psych   Anxiety      Neuromuscular disease CVA, No Residual Symptoms    GI/Hepatic ,GERD  Medicated and Controlled,,  Endo/Other    Renal/GU Renal InsufficiencyRenal disease     Musculoskeletal  (+) Arthritis , Osteoarthritis,    Abdominal  (+)  + obese  Peds  Hematology  (+) Blood dyscrasia, anemia HLD   Anesthesia Other Findings Nuclear stress  EF: 51%. Mildly a synchronous contraction. There was no ST segment deviation noted during stress. No perfusion defects at stress. This is a low risk study. No evidence of ischemia identified.   Candee Furbish, MD  The left ventricular wall motion is normal. Mild asynchronous contraction     Reproductive/Obstetrics                             Anesthesia Physical Anesthesia Plan  ASA: 3  Anesthesia Plan: MAC   Post-op Pain Management: Minimal or no pain anticipated   Induction: Intravenous  PONV Risk Score and Plan: 1 and Ondansetron and Treatment may vary due to age or medical condition  Airway Management Planned: Nasal Cannula  Additional Equipment:   Intra-op Plan:   Post-operative Plan:   Informed Consent: I have reviewed the patients History and Physical, chart, labs and discussed the procedure including the risks, benefits and alternatives for the proposed anesthesia with the patient or authorized representative who has indicated his/her understanding and acceptance.       Plan Discussed with: CRNA  Anesthesia Plan Comments:         Anesthesia Quick Evaluation

## 2022-06-13 NOTE — Anesthesia Postprocedure Evaluation (Signed)
Anesthesia Post Note  Patient: Jake Dunn  Procedure(s) Performed: UPPER ESOPHAGEAL ENDOSCOPIC ULTRASOUND (EUS) (Bilateral) ESOPHAGOGASTRODUODENOSCOPY (EGD) WITH PROPOFOL FINE NEEDLE ASPIRATION (FNA) LINEAR     Patient location during evaluation: PACU Anesthesia Type: MAC Level of consciousness: awake and alert Pain management: pain level controlled Vital Signs Assessment: post-procedure vital signs reviewed and stable Respiratory status: spontaneous breathing, nonlabored ventilation and respiratory function stable Cardiovascular status: blood pressure returned to baseline and stable Postop Assessment: no apparent nausea or vomiting Anesthetic complications: no   No notable events documented.  Last Vitals:  Vitals:   06/13/22 0950 06/13/22 1000  BP: 119/65 129/68  Pulse: 61 60  Resp: 15 13  Temp:    SpO2: 94% 97%    Last Pain:  Vitals:   06/13/22 1000  TempSrc:   PainSc: 0-No pain                 Lynda Rainwater

## 2022-06-13 NOTE — Op Note (Addendum)
[facility] Patient Name: Lawrnce Dunn Procedure Date: 06/13/2022 MRN: [id number] Attending MD: Arta Silence , MD, [id number] Date of Birth: 03-17-1941 CSN: [id number] Age: 82 Admit Type: Outpatient Procedure:                Upper EUS Indications:              Suspected mass in pancreas on MRI Providers:                Arta Silence, MD, Mikey College, RN, Brien Mates, Technician Referring MD:              Medicines:                Monitored Anesthesia Care Complications:            No immediate complications. Estimated Blood Loss:     Estimated blood loss: none. Procedure:                Pre-Anesthesia Assessment:                           - Prior to the procedure, a History and Physical                            was performed, and patient medications and                            allergies were reviewed. The patient's tolerance of                            previous anesthesia was also reviewed. The risks                            and benefits of the procedure and the sedation                            options and risks were discussed with the patient.                            All questions were answered, and informed consent                            was obtained. Prior Anticoagulants: The patient has                            taken Eliquis (apixaban), last dose was 2 days                            prior to procedure. ASA Grade Assessment: III - A                            patient with severe systemic disease. After  reviewing the risks and benefits, the patient was                            deemed in satisfactory condition to undergo the                            procedure.                           After obtaining informed consent, the endoscope was                            passed under direct vision. Throughout the                            procedure, the patient's blood pressure, pulse,  and                            oxygen saturations were monitored continuously. The                            [id number]-UCT180 ([id number] Olympus linear ultrasound scope                            was introduced through the mouth, and advanced to                            the duodenal bulb. The upper EUS was accomplished                            without difficulty. The patient tolerated the                            procedure well. Scope In: Scope Out: Findings:      ENDOSONOGRAPHIC FINDING: :      Two round masses were identified in the pancreatic body. The masses were       isoechoic. The largest mass measured 50 mm by 60 mm in maximal       cross-sectional diameter. The outer margins were irregular. There was       sonographic evidence suggesting invasion into the celiac trunk       (manifested by abutment) and the splenic vein (manifested by abutment).       The remainder of the pancreas was examined. The endosonographic       appearance of parenchyma and the upstream pancreatic duct indicated duct       dilation. Fine needle aspiration for cytology was performed. Color       Doppler imaging was utilized prior to needle puncture to confirm a lack       of significant vascular structures within the needle path. Five passes       were made with the 25 gauge needle using a transgastric approach. A       stylet was used. A cytotechnologist was present to evaluate the adequacy       of the specimen. The cellularity of the specimen was adequate. Final  cytology results are pending. Impression:               - A mass was identified in the pancreatic body.                            Fine needle aspiration performed. Vascular                            involvement suspected.                           - Second similar smaller mass seen immediately                            adjacent to the larger mass, about 3 x 2 cm in                            diameter. Moderate Sedation:       None Recommendation:           - Discharge patient to home (via wheelchair).                           - Resume previous diet today.                           - Continue present medications.                           - Resume Eliquis (apixaban) at prior dose in 2 days.                           - Await cytology results.                           - Return to GI clinic PRN. Procedure Code(s):        --- Professional ---                           [phone], Esophagogastroduodenoscopy, flexible,                            transoral; with transendoscopic ultrasound-guided                            intramural or transmural fine needle                            aspiration/biopsy(s), (includes endoscopic                            ultrasound examination limited to the esophagus,                            stomach or duodenum, and adjacent structures) Diagnosis Code(s):        --- Professional ---  K86.89, Other specified diseases of pancreas                           R93.3, Abnormal findings on diagnostic imaging of                            other parts of digestive tract CPT copyright 2022 [employer]. All rights reserved. The codes documented in this report are preliminary and upon coder review may  be revised to meet current compliance requirements. Arta Silence, MD 06/13/2022 9:39:24 AM This report has been signed electronically. Number of Addenda: 0

## 2022-06-14 ENCOUNTER — Encounter: Payer: Self-pay | Admitting: *Deleted

## 2022-06-14 NOTE — Progress Notes (Signed)
PATIENT NAVIGATOR PROGRESS NOTE  Name: Jake Dunn Date: 06/14/2022 MRN: [id number]  DOB: [birth year]   Reason for visit:  Introductory phone call  Comments:  Called and spoke with [name] regarding appt for hospital F/U on 06/16/22 at 2:15. Reviewed parking and directions to building. One support person allowed in the appt. Verbalized understanding  Gave contact information to call with any issues or questions    Time spent counseling/coordinating care: 30-45 minutes

## 2022-06-15 LAB — CYTOLOGY - NON PAP

## 2022-06-15 NOTE — Progress Notes (Signed)
[place] OFFICE PROGRESS NOTE   Diagnosis: Pancreatic masses  INTERVAL HISTORY:   Jake Dunn returns for first outpatient follow-up visit since hospital discharge.  He has mild intermittent nausea.  Appetite is good.  Pain at the low abdomen both sides.  Bowels moving.  Objective:  Vital signs in last 24 hours:  Blood pressure (!) 144/74, pulse 70, temperature 98.2 F (36.8 C), temperature source Oral, resp. rate 18, height 5' 7"$  (1.702 m), weight 231 lb (104.8 kg), SpO2 98 %.    Lymphatics: No palpable cervical, supraclavicular or axillary lymph nodes. Resp: Lungs clear bilaterally. Cardio: Regular rate and rhythm. GI: Abdomen is soft.  Tender at the epigastric region.  No mass.  No hepatosplenomegaly. Vascular: No leg edema.   Lab Results:  Lab Results  Component Value Date   WBC 6.3 06/07/2022   HGB 12.0 (L) 06/07/2022   HCT 35.4 (L) 06/07/2022   MCV 96.5 06/07/2022   PLT 205 06/07/2022   NEUTROABS 5.1 12/20/2020    Imaging:  No results found.  Medications: I have reviewed the patient's current medications.  Assessment/Plan: Pancreas masses CT chest 06/05/2022-10 mm right hilar node, 9 mm central right upper lobe nodule, 2 contiguous lobulated masses in the central abdomen, left nephrectomy MRI abdomen 06/06/2022-2 adjacent hypervascular masses in the sacral pancreas with associated ductal dilatation and atrophy, probable prominent lymph node superior to the right mass, the right-sided mass causes mass effect at the celiac axis and portal vein Upper EUS 06/13/2022-mass identified in the pancreatic body, FNA performed, vascular involvement suspected.  Second similar smaller mass seen immediately adjacent to the larger mass.  Cytology-malignant cells present, solid pseudopapillary neoplasm favored 2.   Remote history of renal cell carcinoma, status post a left nephrectomy 03/16/1994 status post left nephrectomy, renal cell carcinoma, pT2, pNX, pMX,  tumor characterized by sheets and clusters of neoplastic clear to granular cells, consistent with a renal cell carcinoma grade 2, no definite diagnostic vascular invasion of larger vessels, tumor does not appear to extend through the renal capsule. 3.   Atrial fibrillation 4.   Coronary artery disease, coronary artery bypass surgery in 2020 5.   History of CVA 6.   Multiple spine surgeries 7.   Left upper lobe nodule and borderline enlarged right hilar node on chest CT 06/05/2022 8.   Gout 9.   History of adenomatous polyps 10.  Family history of pancreas cancer  Disposition: Jake Dunn appears stable.  He is accompanied to today's visit by his wife.  We reviewed the cytology report from the fine-needle aspiration of the pancreatic mass with them at today's visit.  They understand malignant cells are present.  Dr. Benay Spice reviewed the possibility of pancreas cancer, recurrent kidney cancer, another cancer metastatic to the pancreas.  We are referring him for a PET scan.  He may need another biopsy.  Obtaining CA 19-9 today.  His sister had pancreas cancer.  We made a referral to genetics.  He will return for a follow-up appointment 06/29/2022.  He will contact the office in the interim with any problems.  Patient seen with Dr. Benay Spice.  Ned Card ANP/GNP-BC   06/16/2022  3:22 PM  This was a shared visit with Ned Card.  Jake Dunn was interviewed and examined.  An EUS confirmed 2 masses in the pancreas body.  A biopsy confirmed malignant cells, though a specific tumor site could not be identified with immunohistochemical stains.  We discussed the differential diagnosis with Jake Dunn  and his wife.  We reviewed CT images.  The differential diagnosis includes pancreas cancer, recurrent renal cell carcinoma, and metastatic disease from another primary tumor site.  He will undergo a staging PET scan to assess the systemic tumor burden and look for a primary tumor site.  We will consider an  additional biopsy if the PET reveals accessible tumor.  I was present for greater than 50% of today's visit.  I performed medical decision making.  Julieanne Manson, MD

## 2022-06-16 ENCOUNTER — Inpatient Hospital Stay: Payer: Medicare Other | Attending: Nurse Practitioner | Admitting: Nurse Practitioner

## 2022-06-16 ENCOUNTER — Encounter: Payer: Self-pay | Admitting: *Deleted

## 2022-06-16 ENCOUNTER — Inpatient Hospital Stay: Payer: Medicare Other

## 2022-06-16 VITALS — BP 144/74 | HR 70 | Temp 98.2°F | Resp 18 | Ht 67.0 in | Wt 231.0 lb

## 2022-06-16 DIAGNOSIS — Z8673 Personal history of transient ischemic attack (TIA), and cerebral infarction without residual deficits: Secondary | ICD-10-CM | POA: Diagnosis not present

## 2022-06-16 DIAGNOSIS — R911 Solitary pulmonary nodule: Secondary | ICD-10-CM | POA: Diagnosis not present

## 2022-06-16 DIAGNOSIS — M109 Gout, unspecified: Secondary | ICD-10-CM | POA: Insufficient documentation

## 2022-06-16 DIAGNOSIS — Z85528 Personal history of other malignant neoplasm of kidney: Secondary | ICD-10-CM | POA: Diagnosis not present

## 2022-06-16 DIAGNOSIS — I2510 Atherosclerotic heart disease of native coronary artery without angina pectoris: Secondary | ICD-10-CM | POA: Insufficient documentation

## 2022-06-16 DIAGNOSIS — Z905 Acquired absence of kidney: Secondary | ICD-10-CM | POA: Diagnosis not present

## 2022-06-16 DIAGNOSIS — I4891 Unspecified atrial fibrillation: Secondary | ICD-10-CM | POA: Diagnosis not present

## 2022-06-16 DIAGNOSIS — K8689 Other specified diseases of pancreas: Secondary | ICD-10-CM | POA: Insufficient documentation

## 2022-06-16 DIAGNOSIS — Z800 Family history of malignant neoplasm of digestive organs: Secondary | ICD-10-CM | POA: Insufficient documentation

## 2022-06-16 NOTE — Progress Notes (Signed)
No depression

## 2022-06-16 NOTE — Progress Notes (Signed)
PATIENT NAVIGATOR PROGRESS NOTE  Name: BREES HOUNSHELL Date: 06/16/2022 MRN: [id number]  DOB: 01-Jun-1940   Reason for visit:  New patient appointment  Comments:  Met with Mr and Mrs Olguin during appt with Ned Card NP and Dr Benay Spice Referral to Genetics, significant family hx of cancer including one sister with pancreatic cancer who underwent a Whipple  Submit to GI conference list Obtained records from renal cell carcinoma 1995 Will arrange for PET scan once PA obtained CA 19.9 lab checked today Given contact information to call with any issues or questions    Time spent counseling/coordinating care: > 60 minutes

## 2022-06-17 ENCOUNTER — Encounter (HOSPITAL_COMMUNITY): Payer: Self-pay | Admitting: Gastroenterology

## 2022-06-17 ENCOUNTER — Telehealth: Payer: Self-pay | Admitting: Licensed Clinical Social Worker

## 2022-06-17 NOTE — Telephone Encounter (Signed)
Scheduled appt per 2/8 referral. Pt is aware of appt date and time. Pt is aware to arrive 15 mins prior to appt time and to bring and updated insurance card. Pt is aware of appt location.

## 2022-06-18 LAB — CANCER ANTIGEN 19-9: CA 19-9: 10 U/mL (ref 0–35)

## 2022-06-21 ENCOUNTER — Other Ambulatory Visit: Payer: Self-pay | Admitting: *Deleted

## 2022-06-21 ENCOUNTER — Ambulatory Visit: Payer: Medicare Other | Admitting: Physician Assistant

## 2022-06-21 MED ORDER — PROCHLORPERAZINE MALEATE 10 MG PO TABS: 10.0000 mg | ORAL_TABLET | Freq: Four times a day (QID) | ORAL | 1 refills | Status: DC | PRN

## 2022-06-21 NOTE — Progress Notes (Signed)
Mrs. Moynihan called to request script for nausea for him to [facility] at [place]. Script sent.

## 2022-06-29 ENCOUNTER — Ambulatory Visit: Payer: Medicare Other | Admitting: Nurse Practitioner

## 2022-06-29 ENCOUNTER — Telehealth: Payer: Self-pay | Admitting: *Deleted

## 2022-06-29 ENCOUNTER — Other Ambulatory Visit: Payer: Self-pay

## 2022-06-29 ENCOUNTER — Other Ambulatory Visit (HOSPITAL_COMMUNITY): Payer: Self-pay | Admitting: Gastroenterology

## 2022-06-29 DIAGNOSIS — K8689 Other specified diseases of pancreas: Secondary | ICD-10-CM

## 2022-06-29 NOTE — Progress Notes (Signed)
The proposed treatment discussed in conference is for discussion purpose only and is not a binding recommendation.  The patients have not been physically examined, or presented with their treatment options.  Therefore, final treatment plans cannot be decided.  

## 2022-06-29 NOTE — Telephone Encounter (Signed)
Per Dr. Benay Spice: cytology markers are negative for NET markers. He prefers to stick with the currently scheduled PET. Left phone # for Dr. Paulita Fujita to call Dr. Benay Spice if he still has concerns.

## 2022-06-29 NOTE — Telephone Encounter (Signed)
Call from [place] w/Dr. Erlinda Hong office. Note that patient having PET scan on 07/04/22 and GI wants a PET Dotate scan due to looking for neuroendocrine tumor. Asking if the current PET will see what they need or should it be changed to NET-Dotate scan.

## 2022-07-04 ENCOUNTER — Inpatient Hospital Stay: Payer: Medicare Other | Admitting: Nurse Practitioner

## 2022-07-04 ENCOUNTER — Encounter (HOSPITAL_COMMUNITY)
Admission: RE | Admit: 2022-07-04 | Discharge: 2022-07-04 | Disposition: A | Discharge status: Home / Self Care | Payer: Medicare Other | Source: Ambulatory Visit | Attending: Nurse Practitioner | Admitting: Nurse Practitioner

## 2022-07-04 ENCOUNTER — Encounter: Payer: Self-pay | Admitting: Nurse Practitioner

## 2022-07-04 VITALS — BP 138/66 | HR 75 | Temp 98.2°F | Resp 18 | Wt 228.0 lb

## 2022-07-04 DIAGNOSIS — I700 Atherosclerosis of aorta: Secondary | ICD-10-CM | POA: Insufficient documentation

## 2022-07-04 DIAGNOSIS — I2510 Atherosclerotic heart disease of native coronary artery without angina pectoris: Secondary | ICD-10-CM | POA: Diagnosis not present

## 2022-07-04 DIAGNOSIS — K8689 Other specified diseases of pancreas: Secondary | ICD-10-CM

## 2022-07-04 DIAGNOSIS — Z85528 Personal history of other malignant neoplasm of kidney: Secondary | ICD-10-CM | POA: Diagnosis not present

## 2022-07-04 DIAGNOSIS — R911 Solitary pulmonary nodule: Secondary | ICD-10-CM | POA: Diagnosis not present

## 2022-07-04 DIAGNOSIS — I4891 Unspecified atrial fibrillation: Secondary | ICD-10-CM | POA: Diagnosis not present

## 2022-07-04 DIAGNOSIS — Z800 Family history of malignant neoplasm of digestive organs: Secondary | ICD-10-CM | POA: Diagnosis not present

## 2022-07-04 DIAGNOSIS — M109 Gout, unspecified: Secondary | ICD-10-CM | POA: Diagnosis not present

## 2022-07-04 DIAGNOSIS — Z8673 Personal history of transient ischemic attack (TIA), and cerebral infarction without residual deficits: Secondary | ICD-10-CM | POA: Diagnosis not present

## 2022-07-04 DIAGNOSIS — R590 Localized enlarged lymph nodes: Secondary | ICD-10-CM | POA: Insufficient documentation

## 2022-07-04 DIAGNOSIS — Z951 Presence of aortocoronary bypass graft: Secondary | ICD-10-CM | POA: Diagnosis not present

## 2022-07-04 DIAGNOSIS — R933 Abnormal findings on diagnostic imaging of other parts of digestive tract: Secondary | ICD-10-CM | POA: Diagnosis not present

## 2022-07-04 DIAGNOSIS — Z905 Acquired absence of kidney: Secondary | ICD-10-CM | POA: Insufficient documentation

## 2022-07-04 LAB — GLUCOSE, CAPILLARY: Glucose-Capillary: 101 mg/dL — ABNORMAL HIGH (ref 70–99)

## 2022-07-04 MED ORDER — FLUDEOXYGLUCOSE F - 18 (FDG) INJECTION
11.5000 | Freq: Once | INTRAVENOUS | Status: AC | PRN
  Administered 2022-07-04: 11.5 via INTRAVENOUS

## 2022-07-04 NOTE — Progress Notes (Unsigned)
[place] OFFICE PROGRESS NOTE   Diagnosis: Pancreatic masses  INTERVAL HISTORY:   Jake Dunn returns as scheduled.  He continues to have mild intermittent nausea.  He noted some improvement with Compazine.  Bowels are moving.  Objective:  Vital signs in last 24 hours:  Blood pressure 138/66, pulse 75, temperature 98.2 F (36.8 C), temperature source Tympanic, resp. rate 18, weight 228 lb (103.4 kg), SpO2 98 %.    Lymphatics: No palpable cervical, supraclavicular or axillary lymph nodes. Resp: Lungs clear bilaterally. Cardio: Regular rate and rhythm. GI: Abdomen soft and nontender.  No hepatosplenomegaly.  No mass. Vascular: No leg edema.    Lab Results:  Lab Results  Component Value Date   WBC 6.3 06/07/2022   HGB 12.0 (L) 06/07/2022   HCT 35.4 (L) 06/07/2022   MCV 96.5 06/07/2022   PLT 205 06/07/2022   NEUTROABS 5.1 12/20/2020    Imaging:  No results found.  Medications: I have reviewed the patient's current medications.  Assessment/Plan: Pancreas masses CT chest 06/05/2022-10 mm right hilar node, 9 mm central right upper lobe nodule, 2 contiguous lobulated masses in the central abdomen, left nephrectomy MRI abdomen 06/06/2022-2 adjacent hypervascular masses in the central pancreas with associated ductal dilatation and atrophy, probable prominent lymph node superior to the right mass, the right-sided mass causes mass effect at the celiac axis and portal vein Upper EUS 06/13/2022-mass identified in the pancreatic body, FNA performed, vascular involvement suspected.  Second similar smaller mass seen immediately adjacent to the larger mass.  Cytology-malignant cells present, solid pseudopapillary neoplasm favored PET scan 07/04/2022-mild increased FDG uptake associated with the dominant mass centered around the pancreatic neck.  Also mild increased uptake associated with the mass within the tail of the pancreas.  Solid-appearing lesion or lymph node within  the gastrohepatic ligament which directly abuts the superior margin of the dominant pancreatic mass, also exhibits mild increased FDG uptake.  Mild FDG uptake associated with a right upper lobe lung nodule. 2.   Remote history of renal cell carcinoma, status post a left nephrectomy 03/16/1994 status post left nephrectomy, renal cell carcinoma, pT2, pNX, pMX, tumor characterized by sheets and clusters of neoplastic clear to granular cells, consistent with a renal cell carcinoma grade 2, no definite diagnostic vascular invasion of larger vessels, tumor does not appear to extend through the renal capsule. 3.   Atrial fibrillation 4.   Coronary artery disease, coronary artery bypass surgery in 2020 5.   History of CVA 6.   Multiple spine surgeries 7.   Left upper lobe nodule and borderline enlarged right hilar node on chest CT 06/05/2022 8.   Gout 9.   History of adenomatous polyps 10.  Family history of pancreas cancer  Disposition: Jake Dunn appears stable.  We reviewed the PET scan report/images with him and his daughter at today's visit.  They understand there is mild uptake associated with the pancreatic masses as well as a probable lymph node within the gastrohepatic ligament and a right upper lobe lung nodule.  Additional stains were requested on the FNA material obtained 06/13/2022.  We will contact pathology for an update.  Jake Dunn and his daughter understand findings could indicate recurrence of the kidney cancer, a rare type of pancreas cancer, neuroendocrine tumor or less likely gastrointestinal stromal tumor.  We discussed that another biopsy may be necessary.  Dr. Benay Spice will review his case with a surgeon regarding possible biopsy.  We will contact Jake Dunn once a plan has  been established.  Patient seen with Dr. Benay Spice.  Ned Card ANP/GNP-BC   07/04/2022  1:38 PM This was a shared visit with Ned Card.  We discussed the PET findings and images with Jake Dunn.  We discussed  the differential diagnosis.  His case was presented at the GI tumor conference last week.  Malignant cells were noted on the pancreas mass biopsy, but a specific tumor type was not confirmed.  We are waiting on an additional immunohistochemical stain to look for evidence of renal cell carcinoma.  I will present his case at the tumor conference this week to see whether he may be candidate for a laparoscopic biopsy of the gastrohepatic node.  We discussed the differential diagnosis including recurrent renal cell carcinoma, an atypical primary pancreas tumor, neuroendocrine tumor, metastatic disease from another primary tumor site, and a gastrointestinal stromal tumor.  We will contact him after the tumor conference discussion.  I was present for greater than 50% of today's visit.  I performed medical decision making.  Julieanne Manson, MD

## 2022-07-06 ENCOUNTER — Other Ambulatory Visit: Payer: Self-pay

## 2022-07-06 NOTE — Progress Notes (Signed)
The proposed treatment discussed in conference is for discussion purpose only and is not a binding recommendation.  The patients have not been physically examined, or presented with their treatment options.  Therefore, final treatment plans cannot be decided.  

## 2022-07-07 ENCOUNTER — Ambulatory Visit: Payer: Medicare Other | Admitting: General Practice

## 2022-07-14 ENCOUNTER — Telehealth: Payer: Self-pay

## 2022-07-14 ENCOUNTER — Other Ambulatory Visit: Payer: Self-pay | Admitting: Gastroenterology

## 2022-07-14 NOTE — Telephone Encounter (Signed)
   Pre-operative Risk Assessment    Patient Name: Jake Dunn  DOB: Sep 14, 1940 MRN: [id number]     Request for Surgical Clearance    Procedure:  EUS - Endoscopic ultrasound   Date of Surgery:  Clearance TBD                                 Surgeon:  Dr. Francina Ames Group or Practice Name:  [facility]  Phone number:  [phone] Fax number:  [phone]   Type of Clearance Requested:   - Pharmacy:  Hold Apixaban (Eliquis)     Type of Anesthesia:  Propofol    Additional requests/questions:    [name]   07/14/2022, 5:24 PM

## 2022-07-15 NOTE — Telephone Encounter (Signed)
Patient with diagnosis of atrial fibrillation on Eliquis for anticoagulation.    Procedure:  EUS - Endoscopic ultrasound    Date of Surgery:  Clearance TBD    CHA2DS2-VASc Score = 7   This indicates a 11.2% annual risk of stroke. The patient's score is based upon: CHF History: 1 HTN History: 1 Diabetes History: 0 Stroke History: 2 Vascular Disease History: 1 Age Score: 2 Gender Score: 0   Chart notes stroke history is "mini stroke > 15 years ago"  CrCl 67 Platelet count 205  Per office protocol, patient can hold Eliquis for 1 days prior to procedure.   Patient will not need bridging with Lovenox (enoxaparin) around procedure.  **This guidance is not considered finalized until pre-operative APP has relayed final recommendations.**

## 2022-07-15 NOTE — Telephone Encounter (Signed)
Patient has office visit with Dr. Martinique on 07/21/2022.  Appointment notes have been updated. Will remove from preop pool.  Emmaline Life, NP-C  07/15/2022, 12:04 PM [address], [address] Office [phone] Fax [phone]

## 2022-07-20 ENCOUNTER — Encounter (HOSPITAL_COMMUNITY): Payer: Self-pay | Admitting: Gastroenterology

## 2022-07-20 NOTE — Progress Notes (Signed)
Attempted to obtain medical history via telephone, unable to reach at this time. HIPAA compliant voicemail message left requesting return call to pre surgical testing department. 

## 2022-07-20 NOTE — Progress Notes (Unsigned)
Cardiology Office Note   Date:  07/21/2022   ID:  Jake, Dunn [birth year], MRN [id number]  PCP:  Harlan Stains, MD  Cardiologist:   Jenet Durio Martinique, MD   Chief Complaint  Patient presents with   Coronary Artery Disease   Atrial Fibrillation      History of Present Illness: Jake Dunn is a 82 y.o. male who is seen for follow up CAD s/p CABG. He has a  PMH of CAD, GERD, HTN, HLD, renal cell CA s/p left nephrectomy. Cardiac catheterization in 2014 showed nonobstructive CAD with 70% lesion in third diagonal that was treated medically.    He presented with left sided chest pressure on 08/15/2016. He underwent cardiac catheterization on 08/16/2016, this revealed two-vessel CAD with moderate calcified stenosis in the mid LAD, second diagonal, ramus intermedius, ostial and proximal left circumflex and a severe stenosis of the second PLA branch of the RCA. Overall normal LVEDP and normal LVEF. Recommended  medical therapy and outpatient exercise stress Myoview. While the LAD and the diagonal only showed moderate progression compared to the 2010/2014 studies, the left circumflex and intermediate stenosis had progressed. Echocardiogram obtained on 08/17/2016 showed EF 0000000, grade 2 diastolic dysfunction, severely dilated left atrium, PA peak pressure 30 mmHg. Lexiscan Myoview obtained on 08/23/2016 showed EF 51%, no perfusion defect at stress, overall low risk study.     He was seen in November 2020 with symptoms of chest pain and dyspnea. This led to a cardiac cath showing 3 vessel obstructive CAD. He underwent CABG on 04/02/19 by Dr Kipp Brood. CABG X 4.  LIMA LAD, RSVG PLV, Ramus, and D2  (T graft off the ramus vein graft). Post op course complicated by Afib. He was loaded with amiodarone. On his  visit in January 2021 amiodarone and Eliquis were discontinued.   He did have a sleep study showing few hyponeic episodes without frank apnea. Did not meet criteria for CPAP trial.   He was  admitted in August 2022 with recurrent chest pain. Troponins were normal. He underwent cardiac cath which showed good perfusion and no culprit lesion. Subsequent Echo was unremarkable.   He was admitted in Feb 2023 with periprosthetic nondisplaced fracture of the knee. Treated conservatively by ortho. Just completed Rehab.   He was admitted 1/26-1/31/24 after presenting with with waxing/waning chest pain similar to prior angina for several days. Due to persistent symptoms including DOE he came to the ER. Troponins were negative though symptoms concerning for angina so patient admitted to cardiology service for plan for echo and cardiac cath. He was also noted to be in atrial fib, rate controlled, with plan to pursue DOAC following cath. Echo showed EF 50-55% with mild MR. Cardiac cath showed patent grafts with no clear culprit for his chest pain. He was started on Ranexa. He did convert to NSR and was DC on Eliquis. CT of the chest also showed a 4.5 cm thoracic aortic aneurysm. There was 2 large hypervascular masses in the abdomen/pancreas concerning for Cancer. Unable to do needle biopsy so outpatient ERCP recommended. 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 Oncology assessment and plan is pending. Having repeat PET scan tomorrow.   Patient is stressed. Wife fell and broke her hip. She had to have surgery and is in Rehab. He is still quite SOB with activity. The Ranexa helped his chest pain significantly. Denies any palpitations. No swelling. Still trying to do some mowing but had to stop frequently due  to SOB.     Past Medical History:  Diagnosis Date   Adenomatous polyp    Anxiety    Arthritis    "knees, ankles, shoulders" (08/15/2016)   CAD (coronary artery disease)    s/p cath in 2014 showing significant stenosis in the diagonal side branches and in the RV marginal branch. These vessels were small and diffusely diseased. He is managed medically. 08/16/16  Cath, no disease progression   Chronic lower back pain    Disc disease, degenerative, cervical    GERD (gastroesophageal reflux disease)    Gout    Hyperlipidemia    Hypertension    Osteoarthritis    Renal cell carcinoma    left kidney removal   Stroke [facility])    MINI STROKE 15+ YRS AGO, no residual   Stroke ([place])    "I've had 2 or 3"; denies residual on 08/15/2016   Syncope and collapse     Past Surgical History:  Procedure Laterality Date   ANTERIOR CERVICAL DECOMP/DISCECTOMY FUSION  02/2004; 04/2005   [name] 09/20/2010; [name] [id number]   APPLICATION OF ROBOTIC ASSISTANCE FOR SPINAL PROCEDURE N/A 01/02/2018   Procedure: APPLICATION OF ROBOTIC ASSISTANCE FOR SPINAL PROCEDURE;  Surgeon: Kristeen Miss, MD;  Location: [place];  Service: Neurosurgery;  Laterality: N/A;   BACK SURGERY     CARDIAC CATHETERIZATION  12/14/2008   ef 50-55%. SHOWED SIGNIFICANT STENOSIS AND TO DIAGONAL SIDE BRANCHES AND IN THE RIGHT VENTRICULAR MARGINAL BRANCH. THESE VESSELS WERE SMALL AND DIFFUSELY DISEASED   CARDIOVASCULAR STRESS TEST  12/10/2009   EF 61%. EKG negative. Mild peri ischemia. Managed medically.    CARPAL TUNNEL RELEASE Right 11/2005   [name] 09/20/2010   CARPAL TUNNEL RELEASE Left 06/2007   [name] 5/132012   COLONOSCOPY  11/2004   [name] 09/20/2010   COLONOSCOPY W/ BIOPSIES AND POLYPECTOMY  09/2002   [name] 09/20/2010   CORONARY ARTERY BYPASS GRAFT N/A 04/02/2019   Procedure: CORONARY ARTERY BYPASS GRAFTING (CABG) using LIMA to LAD; Endoscopically harvested right greater saphenous vein to the PLB, Ramus, and Diag 2.;  Surgeon: Lajuana Matte, MD;  Location: [place];  Service: Open Heart Surgery;  Laterality: N/A;   ESOPHAGOGASTRODUODENOSCOPY (EGD) WITH PROPOFOL N/A 06/13/2022   Procedure: ESOPHAGOGASTRODUODENOSCOPY (EGD) WITH PROPOFOL;  Surgeon: Arta Silence, MD;  Location: [facility];  Service: Gastroenterology;  Laterality: N/A;   FINE NEEDLE ASPIRATION N/A 06/13/2022   Procedure: FINE NEEDLE  ASPIRATION (FNA) LINEAR;  Surgeon: Arta Silence, MD;  Location: [facility];  Service: Gastroenterology;  Laterality: N/A;   INCISION AND DRAINAGE ABSCESS Right 10/18/2016   Procedure: INCISION AND DRAINAGE ABSCESS RIGHT SHOULDER;  Surgeon: [name], MD;  Location: [place];  Service: Orthopedics;  Laterality: Right;   INCISION AND DRAINAGE OF WOUND Right 08/2005   shoulder/notes 09/20/2010   IR FLUORO GUIDE CV LINE LEFT  10/19/2016   IR US GUIDE VASC ACCESS LEFT  10/19/2016   IRRIGATION AND DEBRIDEMENT SHOULDER Right 12/22/2016   Procedure: IRRIGATION AND DEBRIDEMENT RIGHT SHOULDER;  Surgeon: [name], MD;  Location: [place];  Service: Orthopedics;  Laterality: Right;   JOINT REPLACEMENT     LEFT HEART CATH AND CORONARY ANGIOGRAPHY N/A 08/16/2016   Procedure: Left Heart Cath and Coronary Angiography;  Surgeon: [name], MD;  Location: [place] [facility];  Service: Cardiovascular;  Laterality: N/A;   LEFT HEART CATH AND CORONARY ANGIOGRAPHY N/A 03/26/2019   Procedure: LEFT HEART CATH AND CORONARY ANGIOGRAPHY;  Surgeon: Martinique, Kahil Agner M, MD;  Location: [place] [facility];  Service: Cardiovascular;  Laterality: N/A;   LEFT HEART CATH AND CORS/GRAFTS ANGIOGRAPHY N/A 12/21/2020   Procedure: LEFT HEART CATH AND CORS/GRAFTS ANGIOGRAPHY;  Surgeon: Martinique, Katricia Prehn M, MD;  Location: [place] [facility];  Service: Cardiovascular;  Laterality: N/A;   LEFT HEART CATH AND CORS/GRAFTS ANGIOGRAPHY N/A 06/03/2022   Procedure: LEFT HEART CATH AND CORS/GRAFTS ANGIOGRAPHY;  Surgeon: [name], MD;  Location: [place] [facility];  Service: Cardiovascular;  Laterality: N/A;   LEFT HEART CATHETERIZATION WITH CORONARY ANGIOGRAM N/A 08/09/2012   Procedure: LEFT HEART CATHETERIZATION WITH CORONARY ANGIOGRAM;  Surgeon: Deaysia Grigoryan M Martinique, MD;  Location: [facility];  Service: Cardiovascular;  Laterality: N/A;   LUMBAR FUSION     NEPHRECTOMY Left early 2000s   REPLACEMENT TOTAL KNEE Right  08/2008   [name] 09/07/2010   SHOULDER ARTHROSCOPY WITH ROTATOR CUFF REPAIR Right 06/2005   [name] 09/20/2010   TOTAL KNEE ARTHROPLASTY  06/08/2011   Procedure: TOTAL KNEE ARTHROPLASTY;  Surgeon: [name], MD;  Location: [place];  Service: Orthopedics;  Laterality: Left;   TOTAL SHOULDER ARTHROPLASTY Right 04/06/2016   Procedure: RIGHT REVERSE TOTAL SHOULDER ARTHROPLASTY;  Surgeon: [name], MD;  Location: [place];  Service: Orthopedics;  Laterality: Right;   TOTAL SHOULDER REVISION Right 12/22/2016   Procedure: RIGHT SHOULDER GLENOID AND HUMERAL COMPONENT REVISION;  Surgeon: [name], MD;  Location: [place];  Service: Orthopedics;  Laterality: Right;   UPPER ESOPHAGEAL ENDOSCOPIC ULTRASOUND (EUS) Bilateral 06/13/2022   Procedure: UPPER ESOPHAGEAL ENDOSCOPIC ULTRASOUND (EUS);  Surgeon: Arta Silence, MD;  Location: [facility];  Service: Gastroenterology;  Laterality: Bilateral;   US ECHOCARDIOGRAPHY  12/15/2008   EF 60-65%   US ECHOCARDIOGRAPHY  09/28/2004   EF 55-60%     Current Outpatient Medications  Medication Sig Dispense Refill   acyclovir (ZOVIRAX) 400 MG tablet Take 400 mg by mouth 3 (three) times daily as needed (For cold sores). .  5   allopurinol (ZYLOPRIM) 300 MG tablet Take 300 mg by mouth daily.     apixaban (ELIQUIS) 5 MG TABS tablet Take 1 tablet (5 mg total) by mouth 2 (two) times daily. 60 tablet 5   atorvastatin (LIPITOR) 80 MG tablet Take 1 tablet (80 mg total) by mouth daily. 90 tablet 3   Fluticasone-Umeclidin-Vilant (TRELEGY ELLIPTA) 100-62.5-25 MCG/INH AEPB Inhale 1 puff into the lungs daily. 1 each 3   furosemide (LASIX) 20 MG tablet Take 1 tablet (20 mg total) by mouth daily as needed (weight gain of 3 pounds in one day or 5 pounds in a week). 90 tablet 1   HYDROcodone-acetaminophen (NORCO/VICODIN) 5-325 MG tablet Take 1 tablet by mouth 3 (three) times daily as needed for moderate pain. 30 tablet 0   LORazepam (ATIVAN) 0.5 MG tablet Take 1 tablet (0.5  mg total) by mouth 2 (two) times daily as needed for anxiety. 30 tablet 2   metoprolol tartrate (LOPRESSOR) 25 MG tablet Take 25 mg by mouth 2 (two) times daily.     pantoprazole (PROTONIX) 40 MG tablet Take 40 mg by mouth every evening.      prochlorperazine (COMPAZINE) 10 MG tablet Take 1 tablet (10 mg total) by mouth every 6 (six) hours as needed. 60 tablet 1   ranolazine (RANEXA) 1000 MG SR tablet Take 1 tablet (1,000 mg total) by mouth 2 (two) times daily. 60 tablet 5   sertraline (ZOLOFT) 50 MG tablet Take 50 mg by mouth daily.  nitroGLYCERIN (NITROSTAT) 0.4 MG SL tablet Place 1 tablet (0.4 mg total) under the tongue every 5 (five) minutes as needed for chest pain. (Patient not taking: Reported on 06/16/2022) 90 tablet 3   No current facility-administered medications for this visit.    Allergies:   Latex and Tape    Social History:  The patient  reports that he quit smoking about 33 years ago. His smoking use included cigarettes. He has never used smokeless tobacco. He reports current alcohol use. He reports that he does not use drugs.   Family History:  The patient's family history includes Heart attack in his father; Heart disease in his brother.    ROS:  Please see the history of present illness.   Otherwise, review of systems are positive for none.   All other systems are reviewed and negative.    PHYSICAL EXAM: VS:  BP (!) 160/90   Pulse 66   Ht '5\' 7"'$  (1.702 m)   Wt 229 lb 4 oz (104 kg)   BMI 35.91 kg/m  , BMI Body mass index is 35.91 kg/m.  GEN: Well nourished, well developed, in no acute distress  HEENT: normal  Neck: no JVD, carotid bruits, or masses Cardiac: RRR; no murmurs, rubs, or gallops,no edema. Respiratory:  clear to auscultation bilaterally, normal work of breathing GI: soft, nontender, nondistended, + BS MS: no deformity or atrophy  Skin: warm and dry, no rash Neuro:  Strength and sensation are intact Psych: euthymic mood, full affect   EKG:  EKG is  not ordered today.   Recent Labs: 06/04/2022: B Natriuretic Peptide 244.9 06/05/2022: ALT 15; TSH 1.745 06/07/2022: BUN 20; Creatinine, Ser 1.31; Hemoglobin 12.0; Platelets 205; Potassium 4.2; Sodium 136    Lipid Panel    Component Value Date/Time   CHOL 120 11/21/2019 0953   TRIG 112 11/21/2019 0953   HDL 44 11/21/2019 0953   CHOLHDL 2.7 11/21/2019 0953   CHOLHDL 3.4 03/30/2019 0212   VLDL 35 03/30/2019 0212   LDLCALC 56 11/21/2019 0953    Dated 01/25/19: cholesterol 117, triglycerides 125, HDL 39, LDL 53. CMET, CBC, TSH normal Dated 04/09/20: cholesterol 123, triglycerides 130, HDL 44, LDL 34. Creatinine 1.17. otherwise CBC, CMET and TSH normal Dated 02/16/21: cholesterol 118, triglycerides 99, HDL 41, LDL 58. Dated 12/02/21: cholesterol 128, triglycerides 195, HDL 39, LDL 57, CBC, CMET and TSH normal  Wt Readings from Last 3 Encounters:  07/21/22 229 lb 4 oz (104 kg)  07/04/22 228 lb (103.4 kg)  06/16/22 231 lb (104.8 kg)      Other studies Reviewed: Additional studies/ records that were reviewed today include:   Cath 03/26/19: Procedures  LEFT HEART CATH AND CORONARY ANGIOGRAPHY  Conclusion    Prox RCA lesion is 45% stenosed. 2nd RPL lesion is 80% stenosed. Ost LM to Mid LM lesion is 50% stenosed. Ost Cx to Mid Cx lesion is 80% stenosed. Ramus lesion is 90% stenosed. Mid LAD lesion is 70% stenosed. 2nd Diag lesion is 60% stenosed. The left ventricular systolic function is normal. LV end diastolic pressure is normal. The left ventricular ejection fraction is 55-65% by visual estimate.   1. Three vessel obstructive, Calcific CAD 2. Normal LV function 3. Normal LVEDP   Plan: recommend referral to CT surgery for CABG. Will stop Plavix.     Echo 03/30/19: IMPRESSIONS      1. Left ventricular ejection fraction, by visual estimation, is 55 to 60%. The left ventricle has normal function. There is no left  ventricular hypertrophy.  2. Global right ventricle has  mildly reduced systolic function.The right ventricular size is mildly enlarged. No increase in right ventricular wall thickness.  3. Left atrial size was normal.  4. Right atrial size was mildly dilated.  5. The mitral valve is normal in structure. No evidence of mitral valve regurgitation.  6. The tricuspid valve is normal in structure. Tricuspid valve regurgitation is trivial.  7. The aortic valve is normal in structure. Aortic valve regurgitation is not visualized. No evidence of aortic valve sclerosis or stenosis.  8. The pulmonic valve was grossly normal. Pulmonic valve regurgitation is not visualized.  9. Aortic dilatation noted. 10. There is mild dilatation of the ascending aorta measuring 38 mm. 11. Normal pulmonary artery systolic pressure. 12. The atrial septum is grossly normal.   Cardiac cath 12/21/20:  LEFT HEART CATH AND CORS/GRAFTS ANGIOGRAPHY   Conclusion      Ost Cx to Mid Cx lesion is 80% stenosed.   Prox RCA lesion is 45% stenosed.   Ramus lesion is 90% stenosed.   2nd RPL lesion is 80% stenosed.   Mid LAD lesion is 65% stenosed.   Ost LM to Mid LM lesion is 40% stenosed.   2nd Diag lesion is 90% stenosed.   LIMA graft was visualized by angiography and is normal in caliber.   SVG graft was visualized by angiography and is normal in caliber.   SVG graft was visualized by angiography and is normal in caliber.   The graft exhibits no disease.   The graft exhibits no disease.   The graft exhibits no disease.   LV end diastolic pressure is normal.   3 vessel obstructive CAD Patent LIMA - this is tied to the second diagonal and not the LAD Patent SVG to the ramus intermediate. No visualization of SVG to diagonal which per op note came off the hood of the SVG to ramus Patent SVG to the PL branch of the RCA Normal LVEDP   Plan: there appears to be good blood flow to all major vessels. The LAD was not revascularized but has borderline disease and the patient had prior  Myoview with no ischemia in this territory. Other grafts are patent. Recommend continued medical therapy. Does not appear to be volume overloaded. Echo pending.  Given significant difficulty from the radial approach I would recommend femoral access for any future Cardiac cath procedures.  Coronary Diagrams  Diagnostic Dominance: Right Intervention  Echo 01/08/21: IMPRESSIONS     1. Left ventricular ejection fraction, by estimation, is 50 to 55%. The  left ventricle has low normal function. The left ventricle demonstrates  regional wall motion abnormalities (see scoring diagram/findings for  description). There is moderate left  ventricular hypertrophy. Left ventricular diastolic parameters are  consistent with Grade I diastolic dysfunction (impaired relaxation). There  is moderate hypokinesis of the left ventricular, basal-mid septal wall.   2. Right ventricular systolic function is moderately reduced. The right  ventricular size is mildly enlarged. There is normal pulmonary artery  systolic pressure. The estimated right ventricular systolic pressure is  [id number] mmHg.   3. Left atrial size was mildly dilated.   4. The mitral valve is abnormal. Trivial mitral valve regurgitation.   5. The aortic valve is tricuspid. Aortic valve regurgitation is not  visualized. Mild to moderate aortic valve sclerosis/calcification is  present, without any evidence of aortic stenosis.   6. The inferior vena cava is normal in size with greater than 50%  respiratory variability,  suggesting right atrial pressure of 3 mmHg.   Comparison(s): Changes from prior study are noted. 09/05/2019: LVEF 60-65%,  severe LAE, moderate RAE.   Echo 06/04/22: IMPRESSIONS     1. Pt in atrial fibrillation at time of study.   2. Left ventricular ejection fraction, by estimation, is 50 to 55%. The  left ventricle has low normal function. The left ventricle has no regional  wall motion abnormalities. The left ventricular  internal cavity size was  mildly dilated. There is mild  concentric left ventricular hypertrophy. Left ventricular diastolic  parameters are indeterminate.   3. Right ventricular systolic function is normal. The right ventricular  size is normal. There is normal pulmonary artery systolic pressure.   4. Left atrial size was mildly dilated.   5. Right atrial size was mildly dilated.   6. The mitral valve is grossly normal. Mild mitral valve regurgitation.  No evidence of mitral stenosis.   7. The aortic valve is tricuspid. There is mild calcification of the  aortic valve. Aortic valve regurgitation is not visualized. Aortic valve  sclerosis is present, with no evidence of aortic valve stenosis.   8. Aortic dilatation noted. There is mild dilatation of the aortic root,  measuring 40 mm.   9. The inferior vena cava is normal in size with greater than 50%  respiratory variability, suggesting right atrial pressure of 3 mmHg.   Cardiac cath 06/03/22:  LEFT HEART CATH AND CORS/GRAFTS ANGIOGRAPHY   Conclusion  Multivessel CAD with mild-moderate left main stenosis, moderate LAD stenosis, severe ramus stenosis, severe PLA stenosis S/P CABG with continued graft patency as outlined Normal LVEDP   Med Rx. No culprit for progressive angina - stable anatomy from prior cath studies. Start apixaban tomorrow for new onset afib. Likely DC tomorrow if stable.  Diagnostic Dominance: Right  Intervention  ASSESSMENT AND PLAN:  1. CAD  s/p CABG x 4 on 04/02/19: recent cardiac cath showed patent grafts with no clear culprit for acute chest pain symptoms. Continue ASA,  high dose statin and metoprolol. Chest pain improved on Ranexa. No clear cardiac cause for his dyspnea. EDP normal at cath and EF good. Continue current therapy.     Atrial fibrillation post op CABG.  Recurrent on recent hospitalization. Converted to NSR. Now on Eliquis. There is consideration for surgical sampling of tumor. If so he is  cleared from a cardiac standpoint. Can hold Eliquis 2 days prior.   3. HTN: Blood pressure is elevated today. Reports good BP readings at home. History of orthostasis in the past when more aggressive with BP control. BP was low in hospital.  Will monitor.    4. HLD: Continue on Lipitor 80 mg daily. LDL at goal 56      5. History of CVA on ASA and Plavix pre op. Now on ASA  Only.      6. Cervical/lumbar spine disease. Followed by Dr. Ellene Route.      7. Chronic diastolic CHF. Volume status looks good on exam. On lasix 20 mg daily prn. Continue sodium restriction. EDP normal in hospital.     8. Large abdominal mass. FNA c/w psuedopapillary neoplasm. Plan repeat PET scan tomorrow. Follow up with oncology. He is clear from a cardiac standpoint for whatever treatment needed. If surgery needed just hold Eliquis 2 days prior.     Current medicines are reviewed at length with the patient today.  The patient does not have concerns regarding medicines.  The following changes have been made:  See above  Labs/ tests ordered today include:   No orders of the defined types were placed in this encounter.    Disposition:   FU 3 months   Signed, Derral Colucci Martinique, MD  07/21/2022 10:50 AM    [facility] [address] [place], [place], [address] Phone [phone], Fax [phone]

## 2022-07-21 ENCOUNTER — Encounter: Payer: Self-pay | Admitting: Cardiology

## 2022-07-21 ENCOUNTER — Ambulatory Visit: Payer: Medicare Other | Attending: Cardiology | Admitting: Cardiology

## 2022-07-21 VITALS — BP 160/90 | HR 66 | Ht 67.0 in | Wt 229.2 lb

## 2022-07-21 DIAGNOSIS — I25118 Atherosclerotic heart disease of native coronary artery with other forms of angina pectoris: Secondary | ICD-10-CM

## 2022-07-21 DIAGNOSIS — I480 Paroxysmal atrial fibrillation: Secondary | ICD-10-CM

## 2022-07-21 DIAGNOSIS — E7800 Pure hypercholesterolemia, unspecified: Secondary | ICD-10-CM

## 2022-07-21 DIAGNOSIS — Z951 Presence of aortocoronary bypass graft: Secondary | ICD-10-CM

## 2022-07-21 DIAGNOSIS — I10 Essential (primary) hypertension: Secondary | ICD-10-CM

## 2022-07-22 ENCOUNTER — Encounter (HOSPITAL_COMMUNITY)
Admission: RE | Admit: 2022-07-22 | Discharge: 2022-07-22 | Disposition: A | Discharge status: Home / Self Care | Payer: Medicare Other | Source: Ambulatory Visit | Attending: Gastroenterology | Admitting: Gastroenterology

## 2022-07-22 DIAGNOSIS — K8689 Other specified diseases of pancreas: Secondary | ICD-10-CM | POA: Insufficient documentation

## 2022-07-25 ENCOUNTER — Encounter: Payer: Self-pay | Admitting: *Deleted

## 2022-07-25 NOTE — Progress Notes (Signed)
Noted that biopsy per Dr. Paulita Fujita is 07/27/22 at 0930. Per Dr. Benay Spice: needs to be seen week of 08/01/22. Scheduling message sent.

## 2022-07-26 ENCOUNTER — Encounter (HOSPITAL_COMMUNITY): Payer: Self-pay

## 2022-07-26 ENCOUNTER — Other Ambulatory Visit (HOSPITAL_COMMUNITY): Payer: Medicare Other

## 2022-07-26 NOTE — Progress Notes (Signed)
Called patient for preop 3/13 and lvm to return phone call to pre op testing. Tried to reach patient again on 3/19 and per his wife he had already taken his Eliquis this morning.I told her the office had instructed him to hold for 1 day prior so I would need to let the office know, and they would be calling them back to see if okay to proceed. I spoke with Tiffany and per Dr.Outlaw, the patient had taken the medicine at 7am and would be off the blood thinner for a full 24hrs ,so it was okay to proceed.

## 2022-07-27 ENCOUNTER — Encounter (HOSPITAL_COMMUNITY): Payer: Self-pay | Admitting: Gastroenterology

## 2022-07-27 ENCOUNTER — Ambulatory Visit (HOSPITAL_BASED_OUTPATIENT_CLINIC_OR_DEPARTMENT_OTHER): Payer: Medicare Other | Admitting: Anesthesiology

## 2022-07-27 ENCOUNTER — Other Ambulatory Visit: Payer: Self-pay

## 2022-07-27 ENCOUNTER — Ambulatory Visit (HOSPITAL_COMMUNITY)
Admission: RE | Admit: 2022-07-27 | Discharge: 2022-07-27 | Disposition: A | Discharge status: Home / Self Care | Payer: Medicare Other | Source: Ambulatory Visit | Attending: Gastroenterology | Admitting: Gastroenterology

## 2022-07-27 ENCOUNTER — Encounter (HOSPITAL_COMMUNITY)
Admission: RE | Disposition: A | Discharge status: Home / Self Care | Payer: Self-pay | Source: Ambulatory Visit | Attending: Gastroenterology

## 2022-07-27 ENCOUNTER — Ambulatory Visit (HOSPITAL_COMMUNITY): Payer: Medicare Other | Admitting: Anesthesiology

## 2022-07-27 DIAGNOSIS — Z7901 Long term (current) use of anticoagulants: Secondary | ICD-10-CM | POA: Insufficient documentation

## 2022-07-27 DIAGNOSIS — R933 Abnormal findings on diagnostic imaging of other parts of digestive tract: Secondary | ICD-10-CM | POA: Diagnosis not present

## 2022-07-27 DIAGNOSIS — I2510 Atherosclerotic heart disease of native coronary artery without angina pectoris: Secondary | ICD-10-CM | POA: Diagnosis not present

## 2022-07-27 DIAGNOSIS — Z87891 Personal history of nicotine dependence: Secondary | ICD-10-CM

## 2022-07-27 DIAGNOSIS — Z951 Presence of aortocoronary bypass graft: Secondary | ICD-10-CM | POA: Insufficient documentation

## 2022-07-27 DIAGNOSIS — Z09 Encounter for follow-up examination after completed treatment for conditions other than malignant neoplasm: Secondary | ICD-10-CM | POA: Insufficient documentation

## 2022-07-27 DIAGNOSIS — R896 Abnormal cytological findings in specimens from other organs, systems and tissues: Secondary | ICD-10-CM | POA: Diagnosis not present

## 2022-07-27 DIAGNOSIS — K8689 Other specified diseases of pancreas: Secondary | ICD-10-CM | POA: Diagnosis not present

## 2022-07-27 DIAGNOSIS — I10 Essential (primary) hypertension: Secondary | ICD-10-CM | POA: Insufficient documentation

## 2022-07-27 DIAGNOSIS — K869 Disease of pancreas, unspecified: Secondary | ICD-10-CM | POA: Diagnosis not present

## 2022-07-27 HISTORY — PX: FINE NEEDLE ASPIRATION: SHX5430

## 2022-07-27 HISTORY — PX: EUS: SHX5427

## 2022-07-27 HISTORY — PX: ESOPHAGOGASTRODUODENOSCOPY (EGD) WITH PROPOFOL: SHX5813

## 2022-07-27 SURGERY — FINE NEEDLE ASPIRATION (FNA) LINEAR
Anesthesia: Monitor Anesthesia Care | Laterality: Bilateral

## 2022-07-27 MED ORDER — APIXABAN 5 MG PO TABS: 5.0000 mg | ORAL_TABLET | Freq: Two times a day (BID) | ORAL | Status: DC

## 2022-07-27 MED ORDER — LACTATED RINGERS IV SOLN: INTRAVENOUS | Status: DC | PRN

## 2022-07-27 MED ORDER — PROPOFOL 500 MG/50ML IV EMUL
INTRAVENOUS | Status: DC | PRN
  Administered 2022-07-27: 125 ug/kg/min via INTRAVENOUS

## 2022-07-27 MED ORDER — PROPOFOL 10 MG/ML IV BOLUS
INTRAVENOUS | Status: DC | PRN
  Administered 2022-07-27 (×3): 25 mg via INTRAVENOUS
  Administered 2022-07-27: 20 mg via INTRAVENOUS

## 2022-07-27 MED ORDER — PHENYLEPHRINE 80 MCG/ML (10ML) SYRINGE FOR IV PUSH (FOR BLOOD PRESSURE SUPPORT)
PREFILLED_SYRINGE | INTRAVENOUS | Status: DC | PRN
  Administered 2022-07-27: 80 ug via INTRAVENOUS

## 2022-07-27 MED ORDER — PROPOFOL 1000 MG/100ML IV EMUL
INTRAVENOUS | Status: AC
  Filled 2022-07-27: qty 100

## 2022-07-27 MED ORDER — PROPOFOL 500 MG/50ML IV EMUL
INTRAVENOUS | Status: AC
  Filled 2022-07-27: qty 50

## 2022-07-27 MED ORDER — LIDOCAINE 2% (20 MG/ML) 5 ML SYRINGE
INTRAMUSCULAR | Status: DC | PRN
  Administered 2022-07-27: 60 mg via INTRAVENOUS
  Administered 2022-07-27: 40 mg via INTRAVENOUS

## 2022-07-27 SURGICAL SUPPLY — 15 items

## 2022-07-27 NOTE — Anesthesia Procedure Notes (Signed)
Procedure Name: LMA Insertion Date/Time: 07/27/2022 9:35 AM  Performed by: Renato Shin, CRNAPre-anesthesia Checklist: Patient identified, Emergency Drugs available, Suction available and Patient being monitored Patient Re-evaluated:Patient Re-evaluated prior to induction Oxygen Delivery Method: Circle system utilized Preoxygenation: Pre-oxygenation with 100% oxygen Induction Type: IV induction Ventilation: Mask ventilation without difficulty LMA: LMA inserted LMA Size: 3.0 Number of attempts: 1 Airway Equipment and Method: Bite block Placement Confirmation: positive ETCO2 and breath sounds checked- equal and bilateral Tube secured with: Tape Dental Injury: Teeth and Oropharynx as per pre-operative assessment

## 2022-07-27 NOTE — Anesthesia Postprocedure Evaluation (Signed)
Anesthesia Post Note  Patient: Jake Dunn  Procedure(s) Performed: FINE NEEDLE ASPIRATION (FNA) LINEAR (Bilateral) ESOPHAGOGASTRODUODENOSCOPY (EGD) WITH PROPOFOL (Bilateral) UPPER ENDOSCOPIC ULTRASOUND (EUS) LINEAR (Bilateral)     Patient location during evaluation: PACU Anesthesia Type: MAC Level of consciousness: awake and alert Pain management: pain level controlled Vital Signs Assessment: post-procedure vital signs reviewed and stable Respiratory status: spontaneous breathing, nonlabored ventilation, respiratory function stable and patient connected to nasal cannula oxygen Cardiovascular status: stable and blood pressure returned to baseline Postop Assessment: no apparent nausea or vomiting Anesthetic complications: no  No notable events documented.  Last Vitals:  Vitals:   07/27/22 1015 07/27/22 1020  BP:  126/78  Pulse: (!) 58 (!) 57  Resp: 13 14  Temp:    SpO2: 94% 97%    Last Pain:  Vitals:   07/27/22 1020  TempSrc:   PainSc: 0-No pain                 Anira Sen[place] S

## 2022-07-27 NOTE — Transfer of Care (Signed)
Immediate Anesthesia Transfer of Care Note  Patient: Jake Dunn  Procedure(s) Performed: FINE NEEDLE ASPIRATION (FNA) LINEAR (Bilateral) ESOPHAGOGASTRODUODENOSCOPY (EGD) WITH PROPOFOL (Bilateral) UPPER ENDOSCOPIC ULTRASOUND (EUS) LINEAR (Bilateral)  Patient Location: PACU and Endoscopy Unit  Anesthesia Type:MAC  Level of Consciousness: drowsy and patient cooperative  Airway & Oxygen Therapy: Patient Spontanous Breathing and Patient connected to nasal cannula oxygen  Post-op Assessment: Report given to RN and Post -op Vital signs reviewed and stable  Post vital signs: Reviewed and stable  Last Vitals:  Vitals Value Taken Time  BP 108/63 07/27/22 1010  Temp    Pulse 58 07/27/22 1011  Resp 12 07/27/22 1011  SpO2 93 % 07/27/22 1011  Vitals shown include unvalidated device data.  Last Pain:  Vitals:   07/27/22 0820  TempSrc: Temporal  PainSc: 3          Complications: No notable events documented.

## 2022-07-27 NOTE — Anesthesia Preprocedure Evaluation (Signed)
Anesthesia Evaluation  Patient identified by MRN, date of birth, ID band Patient awake    Reviewed: Allergy & Precautions, H&P , NPO status , Patient's Chart, lab work & pertinent test results  Airway Mallampati: II  TM Distance: >3 FB Neck ROM: Full    Dental no notable dental hx.    Pulmonary neg pulmonary ROS, former smoker   breath sounds clear to auscultation + decreased breath sounds      Cardiovascular hypertension, + CAD and + CABG  Normal cardiovascular exam Rhythm:Regular Rate:Normal  Pt in atrial fibrillation at time of study.   2. Left ventricular ejection fraction, by estimation, is 50 to 55%. The  left ventricle has low normal function. The left ventricle has no regional  wall motion abnormalities. The left ventricular internal cavity size was  mildly dilated. There is mild  concentric left ventricular hypertrophy. Left ventricular diastolic  parameters are indeterminate.   3. Right ventricular systolic function is normal. The right ventricular  size is normal. There is normal pulmonary artery systolic pressure.   4. Left atrial size was mildly dilated.   5. Right atrial size was mildly dilated.   6. The mitral valve is grossly normal. Mild mitral valve regurgitation.  No evidence of mitral stenosis.   7. The aortic valve is tricuspid. There is mild calcification of the  aortic valve. Aortic valve regurgitation is not visualized. Aortic valve  sclerosis is present, with no evidence of aortic valve stenosis.   8. Aortic dilatation noted. There is mild dilatation of the aortic root,  measuring 40 mm.   9. The inferior vena cava is normal in size with greater than 50%  respiratory variability, suggesting right atrial pressure of 3 mmHg.     Neuro/Psych CVA  negative psych ROS   GI/Hepatic negative GI ROS, Neg liver ROS,,,  Endo/Other  negative endocrine ROS    Renal/GU negative Renal ROS  negative  genitourinary   Musculoskeletal negative musculoskeletal ROS (+)    Abdominal   Peds negative pediatric ROS (+)  Hematology negative hematology ROS (+)   Anesthesia Other Findings   Reproductive/Obstetrics negative OB ROS                             Anesthesia Physical Anesthesia Plan  ASA: 3  Anesthesia Plan: MAC   Post-op Pain Management: Minimal or no pain anticipated   Induction: Intravenous  PONV Risk Score and Plan: 1 and Propofol infusion and Treatment may vary due to age or medical condition  Airway Management Planned: Simple Face Mask  Additional Equipment:   Intra-op Plan:   Post-operative Plan:   Informed Consent: I have reviewed the patients History and Physical, chart, labs and discussed the procedure including the risks, benefits and alternatives for the proposed anesthesia with the patient or authorized representative who has indicated his/her understanding and acceptance.     Dental advisory given  Plan Discussed with: CRNA and Surgeon  Anesthesia Plan Comments:        Anesthesia Quick Evaluation

## 2022-07-27 NOTE — Op Note (Signed)
[facility] Patient Name: Jake Dunn Procedure Date: 07/27/2022 MRN: [id number] Attending MD: Arta Silence , MD, [id number] Date of Birth: 1940/07/30 CSN: [id number] Age: 82 Admit Type: Outpatient Procedure:                Upper EUS Indications:              Suspected mass in pancreas on MRI Providers:                Arta Silence, MD, Jaci Carrel, RN, Shaelynn Dragos Dalton, Technician Referring MD:             Dr. Benay Spice (oncology) Medicines:                Monitored Anesthesia Care Complications:            No immediate complications. Estimated Blood Loss:     Estimated blood loss: none. Procedure:                Pre-Anesthesia Assessment:                           - Prior to the procedure, a History and Physical                            was performed, and patient medications and                            allergies were reviewed. The patient's tolerance of                            previous anesthesia was also reviewed. The risks                            and benefits of the procedure and the sedation                            options and risks were discussed with the patient.                            All questions were answered, and informed consent                            was obtained. Prior Anticoagulants: The patient has                            taken Eliquis (apixaban), last dose was 1 day prior                            to procedure. ASA Grade Assessment: III - A patient                            with severe systemic disease. After reviewing the  risks and benefits, the patient was deemed in                            satisfactory condition to undergo the procedure.                           After obtaining informed consent, the endoscope was                            passed under direct vision. Throughout the                            procedure, the patient's blood pressure, pulse, and                             oxygen saturations were monitored continuously. The                            [id number] Olympus linear ultrasound scope                            was introduced through the mouth, and advanced to                            the second part of duodenum. The upper EUS was                            accomplished without difficulty. The patient                            tolerated the procedure well. Scope In: Scope Out: Findings:      ENDOSONOGRAPHIC FINDING: :      An irregular mass was identified in the pancreatic body. The mass was       isoechoic. The mass measured 55 mm by 65 mm in maximal cross-sectional       diameter. A second 35 x 25 mm lesion also seen. The endosonographic       borders were poorly-defined. There was sonographic evidence suggesting       invasion into the celiac trunk (manifested by abutment) and the splenic       vein (manifested by encasement). The remainder of the pancreas was       examined. The endosonographic appearance of parenchyma and the upstream       pancreatic duct indicated duct dilation. Fine needle aspiration for       cytology was performed. Color Doppler imaging was utilized prior to       needle puncture to confirm a lack of significant vascular structures       within the needle path. Eight passes were made with the 22 gauge needle       using a transgastric approach. A stylet was used. A cytotechnologist was       present to evaluate the adequacy of the specimen. The cellularity of the       specimen was adequate. Final cytology results are pending.      No lymphadenopathy seen.      There was  no sign of significant endosonographic abnormality in the left       lobe of the liver.      There was no sign of significant endosonographic abnormality in the       gallbladder. An unremarkable gallbladder was identified. Impression:               - Two twin masses were identified in the pancreatic                             body. Fine needle aspiration performed.                           - There was no evidence of significant pathology in                            the left lobe of the liver.                           - There was no sign of significant pathology in the                            gallbladder. Moderate Sedation:      None Recommendation:           - Discharge patient to home (via wheelchair).                           - Resume previous diet today.                           - Resume Eliquis (apixaban) at prior dose in 2 days.                           - Await cytology results.                           - Return to GI clinic PRN.                           - Return to referring physician as previously                            scheduled. Procedure Code(s):        --- Professional ---                           [phone], Esophagogastroduodenoscopy, flexible,                            transoral; with transendoscopic ultrasound-guided                            intramural or transmural fine needle                            aspiration/biopsy(s) (includes endoscopic  ultrasound examination of the esophagus, stomach,                            and either the duodenum or a surgically altered                            stomach where the jejunum is examined distal to the                            anastomosis) Diagnosis Code(s):        --- Professional ---                           K86.89, Other specified diseases of pancreas                           R93.3, Abnormal findings on diagnostic imaging of                            other parts of digestive tract CPT copyright 2022 American [employer] Association. All rights reserved. The codes documented in this report are preliminary and upon coder review may  be revised to meet current compliance requirements. Arta Silence, MD 07/27/2022 10:19:19 AM This report has been signed electronically. Number of Addenda: 0

## 2022-07-27 NOTE — Discharge Instructions (Signed)

## 2022-07-27 NOTE — H&P (Signed)
[facility] H/P Note  Chief Complaint: pancreatic mass  HPI: Jake Dunn is an 82 y.o. male.  Pancreatic mass.  Prior EUS FNA malignant cells but not enough tissue for further studies.  Has been discussed at multidisciplinary conference.  Dr. Benay Spice requests repeat EUS for further tissue sampling.  Past Medical History:  Diagnosis Date   Adenomatous polyp    Anxiety    Arthritis    "knees, ankles, shoulders" (08/15/2016)   CAD (coronary artery disease)    s/p cath in 2014 showing significant stenosis in the diagonal side branches and in the RV marginal branch. These vessels were small and diffusely diseased. He is managed medically. 08/16/16 Cath, no disease progression   Chronic lower back pain    Disc disease, degenerative, cervical    GERD (gastroesophageal reflux disease)    Gout    Hyperlipidemia    Hypertension    Osteoarthritis    Renal cell carcinoma    left kidney removal   Stroke [facility])    MINI STROKE 15+ YRS AGO, no residual   Stroke ([place])    "I've had 2 or 3"; denies residual on 08/15/2016   Syncope and collapse     Past Surgical History:  Procedure Laterality Date   ANTERIOR CERVICAL DECOMP/DISCECTOMY FUSION  02/2004; 04/2005   [name] 09/20/2010; [name] [id number]   APPLICATION OF ROBOTIC ASSISTANCE FOR SPINAL PROCEDURE N/A 01/02/2018   Procedure: APPLICATION OF ROBOTIC ASSISTANCE FOR SPINAL PROCEDURE;  Surgeon: Kristeen Miss, MD;  Location: [place];  Service: Neurosurgery;  Laterality: N/A;   BACK SURGERY     CARDIAC CATHETERIZATION  12/14/2008   ef 50-55%. SHOWED SIGNIFICANT STENOSIS AND TO DIAGONAL SIDE BRANCHES AND IN THE RIGHT VENTRICULAR MARGINAL BRANCH. THESE VESSELS WERE SMALL AND DIFFUSELY DISEASED   CARDIOVASCULAR STRESS TEST  12/10/2009   EF 61%. EKG negative. Mild peri ischemia. Managed medically.    CARPAL TUNNEL RELEASE Right 11/2005   [name] 09/20/2010   CARPAL TUNNEL RELEASE Left 06/2007   [name] 5/132012   COLONOSCOPY  11/2004   [name]  09/20/2010   COLONOSCOPY W/ BIOPSIES AND POLYPECTOMY  09/2002   [name] 09/20/2010   CORONARY ARTERY BYPASS GRAFT N/A 04/02/2019   Procedure: CORONARY ARTERY BYPASS GRAFTING (CABG) using LIMA to LAD; Endoscopically harvested right greater saphenous vein to the PLB, Ramus, and Diag 2.;  Surgeon: Lajuana Matte, MD;  Location: [place];  Service: Open Heart Surgery;  Laterality: N/A;   ESOPHAGOGASTRODUODENOSCOPY (EGD) WITH PROPOFOL N/A 06/13/2022   Procedure: ESOPHAGOGASTRODUODENOSCOPY (EGD) WITH PROPOFOL;  Surgeon: Arta Silence, MD;  Location: [facility];  Service: Gastroenterology;  Laterality: N/A;   FINE NEEDLE ASPIRATION N/A 06/13/2022   Procedure: FINE NEEDLE ASPIRATION (FNA) LINEAR;  Surgeon: Arta Silence, MD;  Location: [facility];  Service: Gastroenterology;  Laterality: N/A;   INCISION AND DRAINAGE ABSCESS Right 10/18/2016   Procedure: INCISION AND DRAINAGE ABSCESS RIGHT SHOULDER;  Surgeon: [name], MD;  Location: [place];  Service: Orthopedics;  Laterality: Right;   INCISION AND DRAINAGE OF WOUND Right 08/2005   shoulder/notes 09/20/2010   IR FLUORO GUIDE CV LINE LEFT  10/19/2016   IR US GUIDE VASC ACCESS LEFT  10/19/2016   IRRIGATION AND DEBRIDEMENT SHOULDER Right 12/22/2016   Procedure: IRRIGATION AND DEBRIDEMENT RIGHT SHOULDER;  Surgeon: [name], MD;  Location: [place];  Service: Orthopedics;  Laterality: Right;   JOINT REPLACEMENT     LEFT HEART CATH AND CORONARY ANGIOGRAPHY N/A 08/16/2016   Procedure: Left Heart  Cath and Coronary Angiography;  Surgeon: [name], MD;  Location: [place] [facility];  Service: Cardiovascular;  Laterality: N/A;   LEFT HEART CATH AND CORONARY ANGIOGRAPHY N/A 03/26/2019   Procedure: LEFT HEART CATH AND CORONARY ANGIOGRAPHY;  Surgeon: Martinique, Peter M, MD;  Location: [place] [facility];  Service: Cardiovascular;  Laterality: N/A;   LEFT HEART CATH AND CORS/GRAFTS ANGIOGRAPHY N/A 12/21/2020   Procedure: LEFT HEART  CATH AND CORS/GRAFTS ANGIOGRAPHY;  Surgeon: Martinique, Peter M, MD;  Location: [place] [facility];  Service: Cardiovascular;  Laterality: N/A;   LEFT HEART CATH AND CORS/GRAFTS ANGIOGRAPHY N/A 06/03/2022   Procedure: LEFT HEART CATH AND CORS/GRAFTS ANGIOGRAPHY;  Surgeon: [name], MD;  Location: [place] CV [facility];  Service: Cardiovascular;  Laterality: N/A;   LEFT HEART CATHETERIZATION WITH CORONARY ANGIOGRAM N/A 08/09/2012   Procedure: LEFT HEART CATHETERIZATION WITH CORONARY ANGIOGRAM;  Surgeon: Peter M Martinique, MD;  Location[facility] C[facility] LAB;  Service: Cardiovascular;  Laterality: N/A;   LUMBAR FUSION     NEPHRECTOMY Left early 2000s   REPLACEMENT TOTAL KNEE Right 08/2008   [name] 09/07/2010   SHOULDER ARTHROSCOPY WITH ROTATOR CUFF REPAIR Right 06/2005   [name] 09/20/2010   TOTAL KNEE ARTHROPLASTY  06/08/2011   Procedure: TOTAL KNEE ARTHROPLASTY;  Surgeon: [name], MD;  Location: [place];  Service: Orthopedics;  Laterality: Left;   TOTAL SHOULDER ARTHROPLASTY Right 04/06/2016   Procedure: RIGHT REVERSE TOTAL SHOULDER ARTHROPLASTY;  Surgeon: [name], MD;  Location: [place];  Service: Orthopedics;  Laterality: Right;   TOTAL SHOULDER REVISION Right 12/22/2016   Procedure: RIGHT SHOULDER GLENOID AND HUMERAL COMPONENT REVISION;  Surgeon: [name], MD;  Location: [place];  Service: Orthopedics;  Laterality: Right;   UPPER ESOPHAGEAL ENDOSCOPIC ULTRASOUND (EUS) Bilateral 06/13/2022   Procedure: UPPER ESOPHAGEAL ENDOSCOPIC ULTRASOUND (EUS);  Surgeon: Arta Silence, MD;  Location: [facility];  Service: Gastroenterology;  Laterality: Bilateral;   US ECHOCARDIOGRAPHY  12/15/2008   EF 60-65%   US ECHOCARDIOGRAPHY  09/28/2004   EF 55-60%    Medications Prior to Admission  Medication Sig Dispense Refill   allopurinol (ZYLOPRIM) 300 MG tablet Take 300 mg by mouth daily.     apixaban (ELIQUIS) 5 MG TABS tablet Take 1 tablet (5 mg total) by mouth 2 (two) times daily. 60 tablet 5    atorvastatin (LIPITOR) 80 MG tablet Take 1 tablet (80 mg total) by mouth daily. 90 tablet 3   HYDROcodone-acetaminophen (NORCO/VICODIN) 5-325 MG tablet Take 1 tablet by mouth 3 (three) times daily as needed for moderate pain. 30 tablet 0   LORazepam (ATIVAN) 0.5 MG tablet Take 1 tablet (0.5 mg total) by mouth 2 (two) times daily as needed for anxiety. 30 tablet 2   metoprolol tartrate (LOPRESSOR) 25 MG tablet Take 25 mg by mouth 2 (two) times daily.     nitroGLYCERIN (NITROSTAT) 0.4 MG SL tablet Place 1 tablet (0.4 mg total) under the tongue every 5 (five) minutes as needed for chest pain. 90 tablet 3   pantoprazole (PROTONIX) 40 MG tablet Take 40 mg by mouth every evening.      prochlorperazine (COMPAZINE) 10 MG tablet Take 1 tablet (10 mg total) by mouth every 6 (six) hours as needed. 60 tablet 1   ranolazine (RANEXA) 1000 MG SR tablet Take 1 tablet (1,000 mg total) by mouth 2 (two) times daily. 60 tablet 5   sertraline (ZOLOFT) 50 MG tablet Take 50 mg by mouth daily.     acyclovir (  ZOVIRAX) 400 MG tablet Take 400 mg by mouth 3 (three) times daily as needed (For cold sores). .  5   furosemide (LASIX) 20 MG tablet Take 1 tablet (20 mg total) by mouth daily as needed (weight gain of 3 pounds in one day or 5 pounds in a week). 90 tablet 1    Allergies:  Allergies  Allergen Reactions   Latex Rash    Other reaction(s): Hives/Skin Rash/Itching   Tape Rash    Paper tape okay Other reaction(s): Hives/Rash/Itching    Family History  Problem Relation Age of Onset   Heart attack Father    Heart disease Brother    Allergic rhinitis Neg Hx    Angioedema Neg Hx    Asthma Neg Hx    Atopy Neg Hx    Eczema Neg Hx    Immunodeficiency Neg Hx    Urticaria Neg Hx     Social History:  reports that he quit smoking about 33 years ago. His smoking use included cigarettes. He has never used smokeless tobacco. He reports current alcohol use. He reports that he does not use drugs.   ROS: As per HPI, all  others negative   Blood pressure (!) 156/78, pulse 63, temperature 97.9 F (36.6 C), temperature source Temporal, resp. rate 18, height 5\' 7"  (1.702 m), weight 102.1 kg, SpO2 99 %. General appearance: NAD HEENT:  [place]/AT NEURO:  A/O, no encephalopathy CV:  Regular LUNGS:  No respiratory distress ABD:  Protuberant, soft, non-tender   No results found for this or any previous visit (from the past 48 hour(s)). No results found.  Assessment/Plan   Pancreatic mass.  Unclear exact type of cancer; FNA malignant cells, but not enough residual tissue to do other studies.  He is here for repeat EUS and repeat FNA biopsies. Chronic anticoagulation, apixaban, off 24 hours.  I explained ideally off this medication 48 hours pre-procedure, patient aware of increased risks of bleeding and wishes to proceed today. Risks (bleeding, infection, bowel perforation that could require surgery, sedation-related changes in cardiopulmonary systems), benefits (identification and possible treatment of source of symptoms, exclusion of certain causes of symptoms), and alternatives (watchful waiting, radiographic imaging studies, empiric medical treatment) of upper endoscopy with ultrasound and possible fine needle aspiration (EUS +/- FNA) were explained to patient/family in detail and patient wishes to proceed.   Landry Dyke 07/27/2022, 9:27 AM

## 2022-07-28 ENCOUNTER — Encounter (HOSPITAL_COMMUNITY)
Admission: RE | Admit: 2022-07-28 | Discharge: 2022-07-28 | Disposition: A | Discharge status: Home / Self Care | Payer: Medicare Other | Source: Ambulatory Visit | Attending: Gastroenterology | Admitting: Gastroenterology

## 2022-07-28 ENCOUNTER — Encounter (HOSPITAL_COMMUNITY): Payer: Self-pay | Admitting: Gastroenterology

## 2022-07-28 DIAGNOSIS — K8689 Other specified diseases of pancreas: Secondary | ICD-10-CM | POA: Diagnosis not present

## 2022-07-28 DIAGNOSIS — C7A8 Other malignant neuroendocrine tumors: Secondary | ICD-10-CM | POA: Diagnosis not present

## 2022-07-28 DIAGNOSIS — C259 Malignant neoplasm of pancreas, unspecified: Secondary | ICD-10-CM | POA: Diagnosis not present

## 2022-07-28 LAB — CYTOLOGY - NON PAP

## 2022-07-28 MED ORDER — COPPER CU 64 DOTATATE 1 MCI/ML IV SOLN
4.0000 | Freq: Once | INTRAVENOUS | Status: AC
  Administered 2022-07-28: 4.18 via INTRAVENOUS

## 2022-08-04 ENCOUNTER — Telehealth: Payer: Self-pay | Admitting: Cardiology

## 2022-08-04 DIAGNOSIS — J449 Chronic obstructive pulmonary disease, unspecified: Secondary | ICD-10-CM | POA: Diagnosis not present

## 2022-08-04 DIAGNOSIS — E785 Hyperlipidemia, unspecified: Secondary | ICD-10-CM | POA: Diagnosis not present

## 2022-08-04 DIAGNOSIS — I5032 Chronic diastolic (congestive) heart failure: Secondary | ICD-10-CM | POA: Diagnosis not present

## 2022-08-04 DIAGNOSIS — I10 Essential (primary) hypertension: Secondary | ICD-10-CM | POA: Diagnosis not present

## 2022-08-04 NOTE — Telephone Encounter (Signed)
Patient had PHONE visit with Dr Orest Dikes PA Ernst Bowler.  Patient states X 1 week he has been dizzy when sitting and standing up. Patient does not have any BP reading to report.  He also has complained of nausea increased.  Provider thinks nausea is driven by less food and fluid intake related to patient pancreatic CA.    Patient states he is no longer taking his furosemide due to weight loss.  No fluid retention or SOB reported.  Their office ask if Dr Martinique wants to adjust patient medication.  Advised will send this information to provider for review.

## 2022-08-04 NOTE — Telephone Encounter (Signed)
Left voicemail for patient to return to call to office

## 2022-08-05 NOTE — Telephone Encounter (Signed)
Called patient's daughter Ok Edwards left message on personal voice to call back.

## 2022-08-08 ENCOUNTER — Encounter: Payer: Self-pay | Admitting: Licensed Clinical Social Worker

## 2022-08-08 ENCOUNTER — Other Ambulatory Visit: Payer: Self-pay

## 2022-08-08 ENCOUNTER — Other Ambulatory Visit: Payer: Self-pay | Admitting: Licensed Clinical Social Worker

## 2022-08-08 ENCOUNTER — Inpatient Hospital Stay: Payer: Medicare Other | Attending: Nurse Practitioner | Admitting: Licensed Clinical Social Worker

## 2022-08-08 ENCOUNTER — Inpatient Hospital Stay: Payer: Medicare Other

## 2022-08-08 DIAGNOSIS — R110 Nausea: Secondary | ICD-10-CM | POA: Insufficient documentation

## 2022-08-08 DIAGNOSIS — Z803 Family history of malignant neoplasm of breast: Secondary | ICD-10-CM | POA: Diagnosis not present

## 2022-08-08 DIAGNOSIS — K8689 Other specified diseases of pancreas: Secondary | ICD-10-CM | POA: Diagnosis not present

## 2022-08-08 DIAGNOSIS — R911 Solitary pulmonary nodule: Secondary | ICD-10-CM | POA: Insufficient documentation

## 2022-08-08 DIAGNOSIS — I2510 Atherosclerotic heart disease of native coronary artery without angina pectoris: Secondary | ICD-10-CM | POA: Insufficient documentation

## 2022-08-08 DIAGNOSIS — Z800 Family history of malignant neoplasm of digestive organs: Secondary | ICD-10-CM

## 2022-08-08 DIAGNOSIS — Z86010 Personal history of colonic polyps: Secondary | ICD-10-CM

## 2022-08-08 DIAGNOSIS — Z85528 Personal history of other malignant neoplasm of kidney: Secondary | ICD-10-CM | POA: Insufficient documentation

## 2022-08-08 DIAGNOSIS — M109 Gout, unspecified: Secondary | ICD-10-CM | POA: Insufficient documentation

## 2022-08-08 DIAGNOSIS — I4891 Unspecified atrial fibrillation: Secondary | ICD-10-CM | POA: Insufficient documentation

## 2022-08-08 DIAGNOSIS — Z8042 Family history of malignant neoplasm of prostate: Secondary | ICD-10-CM

## 2022-08-08 DIAGNOSIS — Z8673 Personal history of transient ischemic attack (TIA), and cerebral infarction without residual deficits: Secondary | ICD-10-CM | POA: Insufficient documentation

## 2022-08-08 LAB — GENETIC SCREENING ORDER

## 2022-08-08 NOTE — Progress Notes (Signed)
REFERRING PROVIDER: Owens Shark, NP [address] [place],  [place] [address]  PRIMARY PROVIDER:  Harlan Stains, MD  PRIMARY REASON FOR VISIT:  1. Family history of breast cancer   2. Family history of pancreatic cancer   3. Family history of prostate cancer   4. Personal history of kidney cancer   5. Personal history of colonic polyps   6. Pancreatic mass      HISTORY OF PRESENT ILLNESS:   Jake Dunn, a 82 y.o. male, was seen for a [facility] cancer genetics consultation at the request of Dr. Marcello Moores due to a family history of family history of cancer.  Jake Dunn presents to clinic today to discuss the possibility of a hereditary predisposition to cancer, genetic testing, and to further clarify his future cancer risks, as well as potential cancer risks for family members.   CANCER HISTORY:  Jake Dunn had kidney cancer at age 47, this was treated with left nephrectomy. Jake Dunn was recently found to have pancreatic masses. He meets with Dr. Benay Spice tomorrow.  Jake Dunn also reports having colonoscopy annually due to his history of colon polyps. Colonoscopy reports done through [name], not available for review. He does report having 17+ polyps on his most recent colonoscopy.  Past Medical History:  Diagnosis Date   Adenomatous polyp    Anxiety    Arthritis    "knees, ankles, shoulders" (08/15/2016)   CAD (coronary artery disease)    s/p cath in 2014 showing significant stenosis in the diagonal side branches and in the RV marginal branch. These vessels were small and diffusely diseased. He is managed medically. 08/16/16 Cath, no disease progression   Chronic lower back pain    Disc disease, degenerative, cervical    GERD (gastroesophageal reflux disease)    Gout    Hyperlipidemia    Hypertension    Osteoarthritis    Renal cell carcinoma    left kidney removal   Stroke [facility])    MINI STROKE 15+ YRS AGO, no residual   Stroke ([name])    "I've had 2 or 3"; denies  residual on 08/15/2016   Syncope and collapse     Past Surgical History:  Procedure Laterality Date   ANTERIOR CERVICAL DECOMP/DISCECTOMY FUSION  02/2004; 04/2005   [name] 09/20/2010; [name] [id number]   APPLICATION OF ROBOTIC ASSISTANCE FOR SPINAL PROCEDURE N/A 01/02/2018   Procedure: APPLICATION OF ROBOTIC ASSISTANCE FOR SPINAL PROCEDURE;  Surgeon: Kristeen Miss, MD;  Location: [name];  Service: Neurosurgery;  Laterality: N/A;   BACK SURGERY     CARDIAC CATHETERIZATION  12/14/2008   ef 50-55%. SHOWED SIGNIFICANT STENOSIS AND TO DIAGONAL SIDE BRANCHES AND IN THE RIGHT VENTRICULAR MARGINAL BRANCH. THESE VESSELS WERE SMALL AND DIFFUSELY DISEASED   CARDIOVASCULAR STRESS TEST  12/10/2009   EF 61%. EKG negative. Mild peri ischemia. Managed medically.    CARPAL TUNNEL RELEASE Right 11/2005   [name] 09/20/2010   CARPAL TUNNEL RELEASE Left 06/2007   [name] 5/132012   COLONOSCOPY  11/2004   [name] 09/20/2010   COLONOSCOPY W/ BIOPSIES AND POLYPECTOMY  09/2002   [name] 09/20/2010   CORONARY ARTERY BYPASS GRAFT N/A 04/02/2019   Procedure: CORONARY ARTERY BYPASS GRAFTING (CABG) using LIMA to LAD; Endoscopically harvested right greater saphenous vein to the PLB, Ramus, and Diag 2.;  Surgeon: Lajuana Matte, MD;  Location: [place];  Service: Open Heart Surgery;  Laterality: N/A;   ESOPHAGOGASTRODUODENOSCOPY (EGD) WITH PROPOFOL N/A 06/13/2022   Procedure: ESOPHAGOGASTRODUODENOSCOPY (EGD) WITH PROPOFOL;  Surgeon: Arta Silence, MD;  Location[facility];  Service: Gastroenterology;  Laterality: N/A;   ESOPHAGOGASTRODUODENOSCOPY (EGD) WITH PROPOFOL Bilateral 07/27/2022   Procedure: ESOPHAGOGASTRODUODENOSCOPY (EGD) WITH PROPOFOL;  Surgeon: Arta Silence, MD;  Location: [facility];  Service: Gastroenterology;  Laterality: Bilateral;   EUS Bilateral 07/27/2022   Procedure: UPPER ENDOSCOPIC ULTRASOUND (EUS) LINEAR;  Surgeon: Arta Silence, MD;  Location: [facility];  Service: Gastroenterology;  Laterality:  Bilateral;   FINE NEEDLE ASPIRATION N/A 06/13/2022   Procedure: FINE NEEDLE ASPIRATION (FNA) LINEAR;  Surgeon: Arta Silence, MD;  Location: [facility];  Service: Gastroenterology;  Laterality: N/A;   FINE NEEDLE ASPIRATION Bilateral 07/27/2022   Procedure: FINE NEEDLE ASPIRATION (FNA) LINEAR;  Surgeon: Arta Silence, MD;  Location: [facility];  Service: Gastroenterology;  Laterality: Bilateral;   INCISION AND DRAINAGE ABSCESS Right 10/18/2016   Procedure: INCISION AND DRAINAGE ABSCESS RIGHT SHOULDER;  Surgeon: [name], MD;  Location: [place];  Service: Orthopedics;  Laterality: Right;   INCISION AND DRAINAGE OF WOUND Right 08/2005   shoulder/notes 09/20/2010   IR FLUORO GUIDE CV LINE LEFT  10/19/2016   IR US GUIDE VASC ACCESS LEFT  10/19/2016   IRRIGATION AND DEBRIDEMENT SHOULDER Right 12/22/2016   Procedure: IRRIGATION AND DEBRIDEMENT RIGHT SHOULDER;  Surgeon: [name], MD;  Location: [place];  Service: Orthopedics;  Laterality: Right;   JOINT REPLACEMENT     LEFT HEART CATH AND CORONARY ANGIOGRAPHY N/A 08/16/2016   Procedure: Left Heart Cath and Coronary Angiography;  Surgeon: [name], MD;  Location: [place] CV [facility];  Service: Cardiovascular;  Laterality: N/A;   LEFT HEART CATH AND CORONARY ANGIOGRAPHY N/A 03/26/2019   Procedure: LEFT HEART CATH AND CORONARY ANGIOGRAPHY;  Surgeon: Martinique, Peter M, MD;  Location: [place] CV [facility];  Service: Cardiovascular;  Laterality: N/A;   LEFT HEART CATH AND CORS/GRAFTS ANGIOGRAPHY N/A 12/21/2020   Procedure: LEFT HEART CATH AND CORS/GRAFTS ANGIOGRAPHY;  Surgeon: Martinique, Peter M, MD;  Location: [place] [facility];  Service: Cardiovascular;  Laterality: N/A;   LEFT HEART CATH AND CORS/GRAFTS ANGIOGRAPHY N/A 06/03/2022   Procedure: LEFT HEART CATH AND CORS/GRAFTS ANGIOGRAPHY;  Surgeon: [name], MD;  Location: [place] [facility];  Service: Cardiovascular;  Laterality: N/A;   LEFT HEART CATHETERIZATION WITH  CORONARY ANGIOGRAM N/A 08/09/2012   Procedure: LEFT HEART CATHETERIZATION WITH CORONARY ANGIOGRAM;  Surgeon: Peter M Martinique, MD;  Location: [facility];  Service: Cardiovascular;  Laterality: N/A;   LUMBAR FUSION     NEPHRECTOMY Left early 2000s   REPLACEMENT TOTAL KNEE Right 08/2008   [name] 09/07/2010   SHOULDER ARTHROSCOPY WITH ROTATOR CUFF REPAIR Right 06/2005   [name] 09/20/2010   TOTAL KNEE ARTHROPLASTY  06/08/2011   Procedure: TOTAL KNEE ARTHROPLASTY;  Surgeon: [name], MD;  Location: [place];  Service: Orthopedics;  Laterality: Left;   TOTAL SHOULDER ARTHROPLASTY Right 04/06/2016   Procedure: RIGHT REVERSE TOTAL SHOULDER ARTHROPLASTY;  Surgeon: [name], MD;  Location: [place];  Service: Orthopedics;  Laterality: Right;   TOTAL SHOULDER REVISION Right 12/22/2016   Procedure: RIGHT SHOULDER GLENOID AND HUMERAL COMPONENT REVISION;  Surgeon: [name], MD;  Location: [place];  Service: Orthopedics;  Laterality: Right;   UPPER ESOPHAGEAL ENDOSCOPIC ULTRASOUND (EUS) Bilateral 06/13/2022   Procedure: UPPER ESOPHAGEAL ENDOSCOPIC ULTRASOUND (EUS);  Surgeon: Arta Silence, MD;  Location[facility];  Service: Gastroenterology;  Laterality: Bilateral;   US ECHOCARDIOGRAPHY  12/15/2008   EF 60-65%   US ECHOCARDIOGRAPHY  09/28/2004  EF 55-60%    FAMILY HISTORY:  We obtained a detailed, 4-generation family history.  Significant diagnoses are listed below: Family History  Problem Relation Age of Onset   Heart attack Father    Breast cancer Sister    Breast cancer Sister    Pancreatic cancer Sister    Esophageal cancer Sister    Skin cancer Sister    Heart disease Brother    Lung cancer Brother    Prostate cancer Brother        d. 85s   Cancer Maternal Aunt        unk type   Cancer Maternal Uncle        unk type   Allergic rhinitis Neg Hx    Angioedema Neg Hx    Asthma Neg Hx    Atopy Neg Hx    Eczema Neg Hx    Immunodeficiency Neg Hx    Urticaria Neg Hx    Mr.  Dunn has 1 daughter, 4, no cancers. He had 7 sisters and 2 brothers. One brother had lung cancer and passed in his 18s. His other brother had prostate cancer and passed in his 63s. Two sisters had breast cancer, a sister had pancreatic cancer, another sister had esophageal cancer, and another sister had skin cancer.   Jake Dunn mother died at 37. He is aware that there is cancer on this side of the family, unknown types. His maternal aunt and uncle died from cancer.   Jake Dunn father died at 72. No known cancers on this side of the family.  Jake Dunn is unaware of previous family history of genetic testing for hereditary cancer risks. There is no reported Ashkenazi Jewish ancestry. There is no known consanguinity.    GENETIC COUNSELING ASSESSMENT: Jake Dunn is a 82 y.o. male with a personal history of colon polyps, kidney cancer and now a pancreatic mass, and family history of cancer, which is somewhat suggestive of a hereditary cancer syndrome and predisposition to cancer. We, therefore, discussed and recommended the following at today's visit.   DISCUSSION: We discussed that approximately 10% of cancer is hereditary. Most cases of hereditary breast/prostate/pancreatic cancer are associated with BRCA1/BRCA2 genes, although there are other genes associated with hereditary cancer as well, including genes related to kidney cancer and colon polyps. Cancers and risks are gene specific. We discussed that testing is beneficial for several reasons including knowing about cancer risks, identifying potential screening and risk-reduction options that may be appropriate, and to understand if other family members could be at risk for cancer and allow them to undergo genetic testing.   We reviewed the characteristics, features and inheritance patterns of hereditary cancer syndromes. We also discussed genetic testing, including the appropriate family members to test, the process of testing, insurance  coverage and turn-around-time for results. We discussed the implications of a negative, positive and/or variant of uncertain significant result. We recommended Jake Dunn pursue genetic testing for the [facility] Multi-Cancer+RNA gene panel.   Based on Jake Dunn personal and family history of cancer, he meets medical criteria for genetic testing. Despite that he meets criteria, he may still have an out of pocket cost. We discussed that if his out of pocket cost for testing is over $100, the laboratory will call and confirm whether he wants to proceed with testing.  If the out of pocket cost of testing is less than $100 he will be billed by the genetic testing laboratory.   PLAN: After considering the risks, benefits,  and limitations, Jake Dunn provided informed consent to pursue genetic testing and the blood sample was sent to [facility] for analysis of the Multi-Cancer+RNA panel. Results should be available within approximately 2-3 weeks' time, at which point they will be disclosed by telephone to Jake Dunn, as will any additional recommendations warranted by these results. Jake Dunn will receive a summary of his genetic counseling visit and a copy of his results once available. This information will also be available in Epic.   Jake Dunn questions were answered to his satisfaction today. Our contact information was provided should additional questions or concerns arise. Thank you for the referral and allowing [place] to share in the care of your patient.   Faith Rogue, MS, [facility] Genetic Counselor [email or web address] Phone: [phone]  The patient was seen for a total of 20 minutes in face-to-face genetic counseling.  Dr. Grayland Ormond was available for discussion regarding this case.   _______________________________________________________________________ For Office Staff:  Number of people involved in session: 2 Was an Intern/ student involved with case: no

## 2022-08-08 NOTE — Telephone Encounter (Signed)
Called patient's daughter Ok Edwards left message on personal voice mail to call back.

## 2022-08-09 ENCOUNTER — Inpatient Hospital Stay: Payer: Medicare Other | Admitting: Oncology

## 2022-08-09 ENCOUNTER — Other Ambulatory Visit: Payer: Self-pay

## 2022-08-09 VITALS — BP 136/68 | HR 70 | Temp 98.2°F | Resp 18 | Ht 67.0 in | Wt 227.8 lb

## 2022-08-09 DIAGNOSIS — K8689 Other specified diseases of pancreas: Secondary | ICD-10-CM | POA: Diagnosis not present

## 2022-08-09 DIAGNOSIS — Z85528 Personal history of other malignant neoplasm of kidney: Secondary | ICD-10-CM | POA: Diagnosis not present

## 2022-08-09 DIAGNOSIS — Z800 Family history of malignant neoplasm of digestive organs: Secondary | ICD-10-CM | POA: Diagnosis not present

## 2022-08-09 DIAGNOSIS — I2510 Atherosclerotic heart disease of native coronary artery without angina pectoris: Secondary | ICD-10-CM | POA: Diagnosis not present

## 2022-08-09 DIAGNOSIS — Z8673 Personal history of transient ischemic attack (TIA), and cerebral infarction without residual deficits: Secondary | ICD-10-CM | POA: Diagnosis not present

## 2022-08-09 DIAGNOSIS — I4891 Unspecified atrial fibrillation: Secondary | ICD-10-CM | POA: Diagnosis not present

## 2022-08-09 DIAGNOSIS — R110 Nausea: Secondary | ICD-10-CM | POA: Diagnosis not present

## 2022-08-09 DIAGNOSIS — M109 Gout, unspecified: Secondary | ICD-10-CM | POA: Diagnosis not present

## 2022-08-09 DIAGNOSIS — R911 Solitary pulmonary nodule: Secondary | ICD-10-CM | POA: Diagnosis not present

## 2022-08-09 MED ORDER — ONDANSETRON HCL 8 MG PO TABS: 8.0000 mg | ORAL_TABLET | Freq: Three times a day (TID) | ORAL | 3 refills | Status: DC | PRN

## 2022-08-09 NOTE — Telephone Encounter (Signed)
Called patient daughter, advised of message from [place].   They are going to see Oncologist today, and will keep our office in the loop with any changes from there side.   Patient daughter verbalized understanding.

## 2022-08-09 NOTE — Telephone Encounter (Signed)
Pt's daughter returning nurses call. Please advise 

## 2022-08-09 NOTE — Progress Notes (Signed)
[place] OFFICE PROGRESS NOTE   Diagnosis: Pancreas masses  INTERVAL HISTORY:   Jake Dunn returns as scheduled.  He continues to have nausea.  No emesis.  He has bilateral "hip "discomfort he relates to pain from his spine. He underwent a repeat EUS by Dr. Paulita Fujita on 07/27/2022.  2 masses were noted in the pancreas.  There was evidence of abutment of the celiac trunk and encasement of the splenic vein.  Was upstream pancreatic duct dilation.  A fine-needle aspiration biopsy was performed.  No lymphadenopathy.  No abnormality in the left lobe of the liver. The cytology revealed atypical cells which could not be further characterized as there was absence of cellularity on the cellblock. A dotatate PET on 07/22/2022 revealed moderate activity associated with the pancreatic lesions similar to background liver activity.  A small nodule in the pancreas tail has radiotracer activity.  No hepatic lesions above background activity.  No evidence of metastatic adenopathy or distant metastatic disease.  Objective:  Vital signs in last 24 hours:  Blood pressure 136/68, pulse 70, temperature 98.2 F (36.8 C), temperature source Oral, resp. rate 18, height 5\' 7"  (1.702 m), weight 227 lb 12.8 oz (103.3 kg), SpO2 98 %.    Lymphatics: No cervical, supraclavicular, axillary, or inguinal nodes Resp: Lungs clear bilaterally Cardio: Irregular GI: No mass, nontender, no hepatosplenomegaly Vascular: No leg edema   Lab Results:  Lab Results  Component Value Date   WBC 6.3 06/07/2022   HGB 12.0 (L) 06/07/2022   HCT 35.4 (L) 06/07/2022   MCV 96.5 06/07/2022   PLT 205 06/07/2022   NEUTROABS 5.1 12/20/2020    CMP  Lab Results  Component Value Date   NA 136 06/07/2022   K 4.2 06/07/2022   CL 105 06/07/2022   CO2 23 06/07/2022   GLUCOSE 100 (H) 06/07/2022   BUN 20 06/07/2022   CREATININE 1.31 (H) 06/07/2022   CALCIUM 8.9 06/07/2022   PROT 5.8 (L) 06/05/2022   ALBUMIN 3.2 (L)  06/05/2022   AST 13 (L) 06/05/2022   ALT 15 06/05/2022   ALKPHOS 72 06/05/2022   BILITOT 0.5 06/05/2022   GFRNONAA 55 (L) 06/07/2022   GFRAA 66 11/21/2019    Lab Results  Component Value Date   [id number] 10 06/16/2022     Medications: I have reviewed the patient's current medications.   Assessment/Plan: Pancreas masses CT chest 06/05/2022-10 mm right hilar node, 9 mm central right upper lobe nodule, 2 contiguous lobulated masses in the central abdomen, left nephrectomy MRI abdomen 06/06/2022-2 adjacent hypervascular masses in the central pancreas with associated ductal dilatation and atrophy, probable prominent lymph node superior to the right mass, the right-sided mass causes mass effect at the celiac axis and portal vein Upper EUS 06/13/2022-mass identified in the pancreatic body, FNA performed, vascular involvement suspected.  Second similar smaller mass seen immediately adjacent to the larger mass.  Cytology-malignant cells present, solid pseudopapillary neoplasm favored PET scan 07/04/2022-mild increased FDG uptake associated with the dominant mass centered around the pancreatic neck.  Also mild increased uptake associated with the mass within the tail of the pancreas.  Solid-appearing lesion or lymph node within the gastrohepatic ligament which directly abuts the superior margin of the dominant pancreatic mass, also exhibits mild increased FDG uptake.  Mild FDG uptake associated with a right upper lobe lung nodule EUS 07/27/2022-"Twin" masses identified in the pancreas body, no pathology in the left lobe of the liver or gallbladder, no adenopathy, FNA biopsy-"atypical cells present "-  could not be further characterize due to lack of cellularity on cell block Dotatate PET 07/28/2022-moderate radiotracer activity associated with pancreatic lesions including a small pancreas tail nodule consistent with well-differentiated versus moderately differentiated neuroendocrine tumor, no metastatic  adenopathy or distant metastatic disease 2.   Remote history of renal cell carcinoma, status post a left nephrectomy 03/16/1994 status post left nephrectomy, renal cell carcinoma, pT2, pNX, pMX, tumor characterized by sheets and clusters of neoplastic clear to granular cells, consistent with a renal cell carcinoma grade 2, no definite diagnostic vascular invasion of larger vessels, tumor does not appear to extend through the renal capsule. 3.   Atrial fibrillation 4.   Coronary artery disease, coronary artery bypass surgery in 2020 5.   History of CVA 6.   Multiple spine surgeries 7.   Left upper lobe nodule and borderline enlarged right hilar node on chest CT 06/05/2022 8.   Gout 9.   History of adenomatous polyps 10.  Family history of pancreas cancer 11.  Right upper lobe nodule with mild FDG uptake on PET 07/04/2022    Disposition: Jake Dunn has multiple pancreas masses.  2 biopsies have not confirmed a definitive diagnosis, though malignant cells were seen on the initial biopsy.  The lesions have activity on dotatate PET, above the that seen on the routine PET.  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  I reviewed the PET images and pathology findings with Jake Dunn and his family.  We discussed the probable diagnosis.  He will hold pantoprazole for the next 5 days and return to the lab for a chromogranin a level.  He will return for an office visit and further discussion 08/19/2022.  We discussed potential treatment options including a somatostatin analog and tyrosine kinase inhibitor therapy.  He will begin a trial of ondansetron for nausea. I offered him a second opinion at [facility].  He declines a second opinion for now.   Betsy Coder, MD  08/09/2022  5:09 PM

## 2022-08-15 ENCOUNTER — Inpatient Hospital Stay: Payer: Medicare Other

## 2022-08-15 DIAGNOSIS — Z8673 Personal history of transient ischemic attack (TIA), and cerebral infarction without residual deficits: Secondary | ICD-10-CM | POA: Diagnosis not present

## 2022-08-15 DIAGNOSIS — I4891 Unspecified atrial fibrillation: Secondary | ICD-10-CM | POA: Diagnosis not present

## 2022-08-15 DIAGNOSIS — Z85528 Personal history of other malignant neoplasm of kidney: Secondary | ICD-10-CM | POA: Diagnosis not present

## 2022-08-15 DIAGNOSIS — Z800 Family history of malignant neoplasm of digestive organs: Secondary | ICD-10-CM | POA: Diagnosis not present

## 2022-08-15 DIAGNOSIS — K8689 Other specified diseases of pancreas: Secondary | ICD-10-CM | POA: Diagnosis not present

## 2022-08-15 DIAGNOSIS — M109 Gout, unspecified: Secondary | ICD-10-CM | POA: Diagnosis not present

## 2022-08-15 DIAGNOSIS — I2510 Atherosclerotic heart disease of native coronary artery without angina pectoris: Secondary | ICD-10-CM | POA: Diagnosis not present

## 2022-08-15 DIAGNOSIS — R911 Solitary pulmonary nodule: Secondary | ICD-10-CM | POA: Diagnosis not present

## 2022-08-15 DIAGNOSIS — R110 Nausea: Secondary | ICD-10-CM | POA: Diagnosis not present

## 2022-08-17 LAB — CHROMOGRANIN A: Chromogranin A (ng/mL): 122.2 ng/mL — ABNORMAL HIGH (ref 0.0–101.8)

## 2022-08-18 ENCOUNTER — Telehealth: Payer: Self-pay | Admitting: Licensed Clinical Social Worker

## 2022-08-19 ENCOUNTER — Inpatient Hospital Stay: Payer: Medicare Other | Admitting: Oncology

## 2022-08-19 VITALS — BP 153/76 | HR 66 | Temp 98.1°F | Resp 18 | Ht 67.0 in | Wt 233.2 lb

## 2022-08-19 DIAGNOSIS — M109 Gout, unspecified: Secondary | ICD-10-CM | POA: Diagnosis not present

## 2022-08-19 DIAGNOSIS — C7A8 Other malignant neuroendocrine tumors: Secondary | ICD-10-CM

## 2022-08-19 DIAGNOSIS — Z800 Family history of malignant neoplasm of digestive organs: Secondary | ICD-10-CM | POA: Diagnosis not present

## 2022-08-19 DIAGNOSIS — K8689 Other specified diseases of pancreas: Secondary | ICD-10-CM | POA: Diagnosis not present

## 2022-08-19 DIAGNOSIS — Z85528 Personal history of other malignant neoplasm of kidney: Secondary | ICD-10-CM | POA: Diagnosis not present

## 2022-08-19 DIAGNOSIS — Z8673 Personal history of transient ischemic attack (TIA), and cerebral infarction without residual deficits: Secondary | ICD-10-CM | POA: Diagnosis not present

## 2022-08-19 DIAGNOSIS — R911 Solitary pulmonary nodule: Secondary | ICD-10-CM | POA: Diagnosis not present

## 2022-08-19 DIAGNOSIS — I2510 Atherosclerotic heart disease of native coronary artery without angina pectoris: Secondary | ICD-10-CM | POA: Diagnosis not present

## 2022-08-19 DIAGNOSIS — R110 Nausea: Secondary | ICD-10-CM | POA: Diagnosis not present

## 2022-08-19 DIAGNOSIS — I4891 Unspecified atrial fibrillation: Secondary | ICD-10-CM | POA: Diagnosis not present

## 2022-08-19 NOTE — Progress Notes (Signed)
[facility] OFFICE PROGRESS NOTE   Diagnosis: Pancreas masses  INTERVAL HISTORY:   Jake Dunn returns as scheduled.  Objective:  Vital signs in last 24 hours:  There were no vitals taken for this visit.    HEENT: *** Lymphatics: *** Resp: *** Cardio: *** GI: *** Vascular: *** Neuro:***  Skin:***   Portacath/PICC-without erythema  Lab Results:  Lab Results  Component Value Date   WBC 6.3 06/07/2022   HGB 12.0 (L) 06/07/2022   HCT 35.4 (L) 06/07/2022   MCV 96.5 06/07/2022   PLT 205 06/07/2022   NEUTROABS 5.1 12/20/2020    CMP  Lab Results  Component Value Date   NA 136 06/07/2022   K 4.2 06/07/2022   CL 105 06/07/2022   CO2 23 06/07/2022   GLUCOSE 100 (H) 06/07/2022   BUN 20 06/07/2022   CREATININE 1.31 (H) 06/07/2022   CALCIUM 8.9 06/07/2022   PROT 5.8 (L) 06/05/2022   ALBUMIN 3.2 (L) 06/05/2022   AST 13 (L) 06/05/2022   ALT 15 06/05/2022   ALKPHOS 72 06/05/2022   BILITOT 0.5 06/05/2022   GFRNONAA 55 (L) 06/07/2022   GFRAA 66 11/21/2019    Lab Results  Component Value Date   [id number] 10 06/16/2022    Medications: I have reviewed the patient's current medications.   Assessment/Plan: Pancreas masses CT chest 06/05/2022-10 mm right hilar node, 9 mm central right upper lobe nodule, 2 contiguous lobulated masses in the central abdomen, left nephrectomy MRI abdomen 06/06/2022-2 adjacent hypervascular masses in the central pancreas with associated ductal dilatation and atrophy, probable prominent lymph node superior to the right mass, the right-sided mass causes mass effect at the celiac axis and portal vein Upper EUS 06/13/2022-mass identified in the pancreatic body, FNA performed, vascular involvement suspected.  Second similar smaller mass seen immediately adjacent to the larger mass.  Cytology-malignant cells present, solid pseudopapillary neoplasm favored PET scan 07/04/2022-mild increased FDG uptake associated with the dominant mass  centered around the pancreatic neck.  Also mild increased uptake associated with the mass within the tail of the pancreas.  Solid-appearing lesion or lymph node within the gastrohepatic ligament which directly abuts the superior margin of the dominant pancreatic mass, also exhibits mild increased FDG uptake.  Mild FDG uptake associated with a right upper lobe lung nodule EUS 07/27/2022-"Twin" masses identified in the pancreas body, no pathology in the left lobe of the liver or gallbladder, no adenopathy, FNA biopsy-"atypical cells present "-could not be further characterize due to lack of cellularity on cell block Dotatate PET 07/28/2022-moderate radiotracer activity associated with pancreatic lesions including a small pancreas tail nodule consistent with well-differentiated versus moderately differentiated neuroendocrine tumor, no metastatic adenopathy or distant metastatic disease 2.   Remote history of renal cell carcinoma, status post a left nephrectomy 03/16/1994 status post left nephrectomy, renal cell carcinoma, pT2, pNX, pMX, tumor characterized by sheets and clusters of neoplastic clear to granular cells, consistent with a renal cell carcinoma grade 2, no definite diagnostic vascular invasion of larger vessels, tumor does not appear to extend through the renal capsule. 3.   Atrial fibrillation 4.   Coronary artery disease, coronary artery bypass surgery in 2020 5.   History of CVA 6.   Multiple spine surgeries 7.   Left upper lobe nodule and borderline enlarged right hilar node on chest CT 06/05/2022 8.   Gout 9.   History of adenomatous polyps 10.  Family history of pancreas cancer 11.  Right upper lobe nodule with mild  FDG uptake on PET 07/04/2022     Disposition: ***  Thornton Papas, MD  08/19/2022  8:09 AM

## 2022-08-20 ENCOUNTER — Encounter: Payer: Self-pay | Admitting: Oncology

## 2022-08-24 ENCOUNTER — Inpatient Hospital Stay: Payer: Medicare Other

## 2022-08-24 VITALS — BP 160/78 | HR 63 | Temp 97.9°F | Resp 18

## 2022-08-24 DIAGNOSIS — R110 Nausea: Secondary | ICD-10-CM | POA: Diagnosis not present

## 2022-08-24 DIAGNOSIS — Z800 Family history of malignant neoplasm of digestive organs: Secondary | ICD-10-CM | POA: Diagnosis not present

## 2022-08-24 DIAGNOSIS — C7A8 Other malignant neuroendocrine tumors: Secondary | ICD-10-CM

## 2022-08-24 DIAGNOSIS — I2510 Atherosclerotic heart disease of native coronary artery without angina pectoris: Secondary | ICD-10-CM | POA: Diagnosis not present

## 2022-08-24 DIAGNOSIS — K8689 Other specified diseases of pancreas: Secondary | ICD-10-CM | POA: Diagnosis not present

## 2022-08-24 DIAGNOSIS — Z8673 Personal history of transient ischemic attack (TIA), and cerebral infarction without residual deficits: Secondary | ICD-10-CM | POA: Diagnosis not present

## 2022-08-24 DIAGNOSIS — I4891 Unspecified atrial fibrillation: Secondary | ICD-10-CM | POA: Diagnosis not present

## 2022-08-24 DIAGNOSIS — M109 Gout, unspecified: Secondary | ICD-10-CM | POA: Diagnosis not present

## 2022-08-24 DIAGNOSIS — R911 Solitary pulmonary nodule: Secondary | ICD-10-CM | POA: Diagnosis not present

## 2022-08-24 DIAGNOSIS — Z85528 Personal history of other malignant neoplasm of kidney: Secondary | ICD-10-CM | POA: Diagnosis not present

## 2022-08-24 MED ORDER — OCTREOTIDE ACETATE 20 MG IM KIT
20.0000 mg | PACK | Freq: Once | INTRAMUSCULAR | Status: AC
  Administered 2022-08-24: 20 mg via INTRAMUSCULAR
  Filled 2022-08-24: qty 1

## 2022-08-24 NOTE — Patient Instructions (Signed)
Octreotide Injection Solution What is this medication? OCTREOTIDE (ok TREE oh tide) treats high levels of growth hormone (acromegaly). It works by reducing the amount of growth hormone your body makes. This reduces symptoms and the risk of health problems caused by too much growth hormone, such as diabetes and heart disease. It may also be used to treat diarrhea caused by neuroendocrine tumors. It works by slowing down the release of serotonin from the tumor cells. This reduces the number of bowel movements you have. This medicine may be used for other purposes; ask your health care provider or pharmacist if you have questions. COMMON BRAND NAME(S): Bynfezia, Sandostatin What should I tell my care team before I take this medication? They need to know if you have any of these conditions: Diabetes Gallbladder disease Kidney disease Liver disease Thyroid disease An unusual or allergic reaction to octreotide, other medications, foods, dyes, or preservatives Pregnant or trying to get pregnant Breast-feeding How should I use this medication? This medication is injected under the skin or into a vein. It is usually given by your care team in a hospital or clinic setting. If you get this medication at home, you will be taught how to prepare and give it. Use exactly as directed. Take it as directed on the prescription label at the same time every day. Keep taking it unless your care team tells you to stop. Allow the injection solution to come to room temperature before use. Do not warm it artificially. It is important that you put your used needles and syringes in a special sharps container. Do not put them in a trash can. If you do not have a sharps container, call your pharmacist or care team to get one. Talk to your care team about the use of this medication in children. Special care may be needed. Overdosage: If you think you have taken too much of this medicine contact a poison control center or  emergency room at once. NOTE: This medicine is only for you. Do not share this medicine with others. What if I miss a dose? If you miss a dose, take it as soon as you can. If it is almost time for your next dose, take only that dose. Do not take double or extra doses. What may interact with this medication? Bromocriptine Certain medications for blood pressure, heart disease, irregular heartbeat Cyclosporine Diuretics Medications for diabetes, including insulin Quinidine This list may not describe all possible interactions. Give your health care provider a list of all the medicines, herbs, non-prescription drugs, or dietary supplements you use. Also tell them if you smoke, drink alcohol, or use illegal drugs. Some items may interact with your medicine. What should I watch for while using this medication? Visit your care team for regular checks on your progress. Tell your care team if your symptoms do not start to get better or if they get worse. To help reduce irritation at the injection site, use a different site for each injection and make sure the solution is at room temperature before use. This medication may cause decreases in blood sugar. Signs of low blood sugar include chills, cool, pale skin or cold sweats, drowsiness, extreme hunger, fast heartbeat, headache, nausea, nervousness or anxiety, shakiness, trembling, unsteadiness, tiredness, or weakness. Contact your care team right away if you experience any of these symptoms. This medication may increase blood sugar. The risk may be higher in patients who already have diabetes. Ask your care team what you can do to lower your   risk of diabetes while taking this medication. You should make sure you get enough vitamin B12 while you are taking this medication. Discuss the foods you eat and the vitamins you take with your care team. What side effects may I notice from receiving this medication? Side effects that you should report to your care  team as soon as possible: Allergic reactions--skin rash, itching, hives, swelling of the face, lips, tongue, or throat Gallbladder problems--severe stomach pain, nausea, vomiting, fever Heart rhythm changes--fast or irregular heartbeat, dizziness, feeling faint or lightheaded, chest pain, trouble breathing High blood sugar (hyperglycemia)--increased thirst or amount of urine, unusual weakness or fatigue, blurry vision Low blood sugar (hypoglycemia)--tremors or shaking, anxiety, sweating, cold or clammy skin, confusion, dizziness, rapid heartbeat Low thyroid levels (hypothyroidism)--unusual weakness or fatigue, increased sensitivity to cold, constipation, hair loss, dry skin, weight gain, feelings of depression Low vitamin B12 level--pain, tingling, or numbness in the hands or feet, muscle weakness, dizziness, confusion, trouble concentrating Pancreatitis--severe stomach pain that spreads to your back or gets worse after eating or when touched, fever, nausea, vomiting Side effects that usually do not require medical attention (report to your care team if they continue or are bothersome): Diarrhea Dizziness Gas Headache Pain, redness, or irritation at injection site Stomach pain This list may not describe all possible side effects. Call your doctor for medical advice about side effects. You may report side effects to FDA at 1-800-FDA-1088. Where should I keep my medication? Keep out of the reach of children and pets. Store in the refrigerator. Protect from light. Allow to come to room temperature naturally. Do not use artificial heat. If protected from light, the injection may be stored between 20 and 30 degrees C (70 and 86 degrees F) for 14 days. After the initial use, throw away any unused portion of a multiple dose vial after 14 days. Get rid of any unused portions of the ampules after use. To get rid of medications that are no longer needed or have expired: Take the medication to a medication  take-back program. Ask your pharmacy or law enforcement to find a location. If you cannot return the medication, ask your pharmacist or care team how to get rid of the medication safely. NOTE: This sheet is a summary. It may not cover all possible information. If you have questions about this medicine, talk to your doctor, pharmacist, or health care provider.  2023 Elsevier/Gold Standard (2021-07-28 00:00:00)  

## 2022-08-25 ENCOUNTER — Ambulatory Visit: Payer: Self-pay | Admitting: Licensed Clinical Social Worker

## 2022-08-25 ENCOUNTER — Encounter: Payer: Self-pay | Admitting: Oncology

## 2022-08-25 ENCOUNTER — Encounter: Payer: Self-pay | Admitting: Licensed Clinical Social Worker

## 2022-08-25 DIAGNOSIS — Z1379 Encounter for other screening for genetic and chromosomal anomalies: Secondary | ICD-10-CM

## 2022-08-25 NOTE — Telephone Encounter (Signed)
I contacted Mr. Godeaux to discuss his genetic testing results. No pathogenic variants were identified in the 70 genes analyzed. Detailed clinic note to follow.   The test report has been scanned into EPIC and is located under the Molecular Pathology section of the Results Review tab.  A portion of the result report is included below for reference.      Lacy Duverney, MS, [facility] Genetic Counselor [email or web address] Phone: [phone]

## 2022-08-25 NOTE — Progress Notes (Signed)
HPI:   Mr. Bonneau was previously seen in the [facility] due to a personal and family history of cancer and concerns regarding a hereditary predisposition to cancer. Please refer to our prior cancer genetics clinic note for more information regarding our discussion, assessment and recommendations, at the time. Mr. Monica recent genetic test results were disclosed to him, as were recommendations warranted by these results. These results and recommendations are discussed in more detail below.  CANCER HISTORY:  Oncology History  Neuroendocrine carcinoma of pancreas  08/19/2022 Initial Diagnosis   Neuroendocrine carcinoma of pancreas   08/19/2022 Cancer Staging   Staging form: Neuroendocrine Tumor - Pancreas, AJCC V9 - Clinical: Stage III (cT3, cN1, cM0) - Signed by Ladene Artist, MD on 08/19/2022    Genetic Testing   Negative genetic testing. No pathogenic variants identified on the [facility] Multi-Cancer+RNA panel. The report date is 08/14/2022.   The Multi-Cancer + RNA Panel offered by [facility] includes sequencing and/or deletion/duplication analysis of the following 70 genes:  AIP*, ALK, APC*, ATM*, AXIN2*, BAP1*, BARD1*, BLM*, BMPR1A*, BRCA1*, BRCA2*, BRIP1*, CDC73*, CDH1*, CDK4, CDKN1B*, CDKN2A, CHEK2*, CTNNA1*, DICER1*, EPCAM, EGFR, FH*, FLCN*, GREM1, HOXB13, KIT, LZTR1, MAX*, MBD4, MEN1*, MET, MITF, MLH1*, MSH2*, MSH3*, MSH6*, MUTYH*, NF1*, NF2*, NTHL1*, PALB2*, PDGFRA, PMS2*, POLD1*, POLE*, POT1*, PRKAR1A*, PTCH1*, PTEN*, RAD51C*, RAD51D*, RB1*, RET, SDHA*, SDHAF2*, SDHB*, SDHC*, SDHD*, SMAD4*, SMARCA4*, SMARCB1*, SMARCE1*, STK11*, SUFU*, TMEM127*, TP53*, TSC1*, TSC2*, VHL*. RNA analysis is performed for * genes.     FAMILY HISTORY:  We obtained a detailed, 4-generation family history.  Significant diagnoses are listed below: Family History  Problem Relation Age of Onset   Heart attack Father    Breast cancer Sister    Breast cancer Sister    Pancreatic cancer  Sister    Esophageal cancer Sister    Skin cancer Sister    Heart disease Brother    Lung cancer Brother    Prostate cancer Brother        d. 22s   Cancer Maternal Aunt        unk type   Cancer Maternal Uncle        unk type   Allergic rhinitis Neg Hx    Angioedema Neg Hx    Asthma Neg Hx    Atopy Neg Hx    Eczema Neg Hx    Immunodeficiency Neg Hx    Urticaria Neg Hx    Mr. Knee has 1 daughter, 13, no cancers. He had 7 sisters and 2 brothers. One brother had lung cancer and passed in his 53s. His other brother had prostate cancer and passed in his 54s. Two sisters had breast cancer, a sister had pancreatic cancer, another sister had esophageal cancer, and another sister had skin cancer.    Mr. Sandt mother died at 91. He is aware that there is cancer on this side of the family, unknown types. His maternal aunt and uncle died from cancer.    Mr. Mechling father died at 47. No known cancers on this side of the family.   Mr. Osment is unaware of previous family history of genetic testing for hereditary cancer risks. There is no reported Ashkenazi Jewish ancestry. There is no known consanguinity.        GENETIC TEST RESULTS:  The [facility] Multi-Cancer+RNA Panel found no pathogenic mutations.   The Multi-Cancer + RNA Panel offered by [facility] includes sequencing and/or deletion/duplication analysis of the following 70 genes:  AIP*, ALK, APC*, ATM*, AXIN2*, BAP1*,  BARD1*, BLM*, BMPR1A*, BRCA1*, BRCA2*, BRIP1*, CDC73*, CDH1*, CDK4, CDKN1B*, CDKN2A, CHEK2*, CTNNA1*, DICER1*, EPCAM, EGFR, FH*, FLCN*, GREM1, HOXB13, KIT, LZTR1, MAX*, MBD4, MEN1*, MET, MITF, MLH1*, MSH2*, MSH3*, MSH6*, MUTYH*, NF1*, NF2*, NTHL1*, PALB2*, PDGFRA, PMS2*, POLD1*, POLE*, POT1*, PRKAR1A*, PTCH1*, PTEN*, RAD51C*, RAD51D*, RB1*, RET, SDHA*, SDHAF2*, SDHB*, SDHC*, SDHD*, SMAD4*, SMARCA4*, SMARCB1*, SMARCE1*, STK11*, SUFU*, TMEM127*, TP53*, TSC1*, TSC2*, VHL*. RNA analysis is performed for * genes.   The test  report has been scanned into EPIC and is located under the Molecular Pathology section of the Results Review tab.  A portion of the result report is included below for reference. Genetic testing reported out on 08/14/2022.     Even though a pathogenic variant was not identified, possible explanations for the cancer in the family may include: There may be no hereditary risk for cancer in the family. The cancers in Mr. Gomes and/or his family may be sporadic/familial or due to other genetic and environmental factors. There may be a gene mutation in one of these genes that current testing methods cannot detect but that chance is small. There could be another gene that has not yet been discovered, or that we have not yet tested, that is responsible for the cancer diagnoses in the family.  It is also possible there is a hereditary cause for the cancer in the family that Mr. Aloisi did not inherit.  Therefore, it is important to remain in touch with cancer genetics in the future so that we can continue to offer Mr. Bose the most up to date genetic testing.   ADDITIONAL GENETIC TESTING:  We discussed with Mr. Baiz that his genetic testing was fairly extensive.  If there are additional relevant genes identified to increase cancer risk that can be analyzed in the future, we would be happy to discuss and coordinate this testing at that time.    CANCER SCREENING RECOMMENDATIONS:  Mr. Spivak test result is considered negative (normal).  This means that we have not identified a hereditary cause for his personal and family history of cancer at this time.   An individual's cancer risk and medical management are not determined by genetic test results alone. Overall cancer risk assessment incorporates additional factors, including personal medical history, family history, and any available genetic information that may result in a personalized plan for cancer prevention and surveillance. Therefore, it is  recommended he continue to follow the cancer management and screening guidelines provided by his oncology and primary healthcare provider.  RECOMMENDATIONS FOR FAMILY MEMBERS:   Since he did not inherit a identifiable mutation in a cancer predisposition gene included on this panel, his children could not have inherited a known mutation from him in one of these genes. Individuals in this family might be at some increased risk of developing cancer, over the general population risk, due to the family history of cancer.  Individuals in the family should notify their providers of the family history of cancer. We recommend women in this family have a yearly mammogram beginning at age 72, or 70 years younger than the earliest onset of cancer, an annual clinical breast exam, and perform monthly breast self-exams.  Family members should have colonoscopies by at age 67, or earlier, as recommended by their providers. Other members of the family may still carry a pathogenic variant in one of these genes that Mr. Hillman did not inherit. Based on the family history, we recommend his siblings, especially  those related to his sister that had pancreatic cancer have genetic  counseling and testing. Mr. Fettig will let us know if we can be of any assistance in coordinating genetic counseling and/or testing for this family member.     FOLLOW-UP:  Lastly, we discussed with Mr. Aspinall that cancer genetics is a rapidly advancing field and it is possible that new genetic tests will be appropriate for him and/or his family members in the future. We encouraged him to remain in contact with cancer genetics on an annual basis so we can update his personal and family histories and let him know of advances in cancer genetics that may benefit this family.   Our contact number was provided. Mr. Engelmann questions were answered to his satisfaction, and he knows he is welcome to call us at anytime with additional questions or concerns.     Lacy Duverney, MS, [facility] Genetic Counselor [email or web address] Phone: [phone]

## 2022-09-01 DIAGNOSIS — M5116 Intervertebral disc disorders with radiculopathy, lumbar region: Secondary | ICD-10-CM | POA: Diagnosis not present

## 2022-09-01 DIAGNOSIS — M5416 Radiculopathy, lumbar region: Secondary | ICD-10-CM | POA: Diagnosis not present

## 2022-09-08 ENCOUNTER — Other Ambulatory Visit: Payer: Self-pay | Admitting: Oncology

## 2022-09-21 ENCOUNTER — Encounter: Payer: Self-pay | Admitting: Nurse Practitioner

## 2022-09-21 ENCOUNTER — Inpatient Hospital Stay: Payer: Medicare Other

## 2022-09-21 ENCOUNTER — Inpatient Hospital Stay: Payer: Medicare Other | Attending: Nurse Practitioner | Admitting: Nurse Practitioner

## 2022-09-21 VITALS — BP 120/77 | HR 88 | Temp 98.1°F | Resp 18 | Ht 67.0 in | Wt 221.0 lb

## 2022-09-21 DIAGNOSIS — E340 Carcinoid syndrome: Secondary | ICD-10-CM | POA: Insufficient documentation

## 2022-09-21 DIAGNOSIS — I4891 Unspecified atrial fibrillation: Secondary | ICD-10-CM | POA: Insufficient documentation

## 2022-09-21 DIAGNOSIS — R634 Abnormal weight loss: Secondary | ICD-10-CM | POA: Diagnosis not present

## 2022-09-21 DIAGNOSIS — Z8673 Personal history of transient ischemic attack (TIA), and cerebral infarction without residual deficits: Secondary | ICD-10-CM | POA: Insufficient documentation

## 2022-09-21 DIAGNOSIS — C7A098 Malignant carcinoid tumors of other sites: Secondary | ICD-10-CM | POA: Insufficient documentation

## 2022-09-21 DIAGNOSIS — Z85528 Personal history of other malignant neoplasm of kidney: Secondary | ICD-10-CM | POA: Diagnosis not present

## 2022-09-21 DIAGNOSIS — C7A8 Other malignant neuroendocrine tumors: Secondary | ICD-10-CM

## 2022-09-21 MED ORDER — OCTREOTIDE ACETATE 20 MG IM KIT
20.0000 mg | PACK | Freq: Once | INTRAMUSCULAR | Status: AC
  Administered 2022-09-21: 20 mg via INTRAMUSCULAR

## 2022-09-21 NOTE — Patient Instructions (Signed)
Octreotide Injection Solution What is this medication? OCTREOTIDE (ok TREE oh tide) treats high levels of growth hormone (acromegaly). It works by reducing the amount of growth hormone your body makes. This reduces symptoms and the risk of health problems caused by too much growth hormone, such as diabetes and heart disease. It may also be used to treat diarrhea caused by neuroendocrine tumors. It works by slowing down the release of serotonin from the tumor cells. This reduces the number of bowel movements you have. This medicine may be used for other purposes; ask your health care provider or pharmacist if you have questions. COMMON BRAND NAME(S): Bynfezia, Sandostatin What should I tell my care team before I take this medication? They need to know if you have any of these conditions: Diabetes Gallbladder disease Kidney disease Liver disease Thyroid disease An unusual or allergic reaction to octreotide, other medications, foods, dyes, or preservatives Pregnant or trying to get pregnant Breast-feeding How should I use this medication? This medication is injected under the skin or into a vein. It is usually given by your care team in a hospital or clinic setting. If you get this medication at home, you will be taught how to prepare and give it. Use exactly as directed. Take it as directed on the prescription label at the same time every day. Keep taking it unless your care team tells you to stop. Allow the injection solution to come to room temperature before use. Do not warm it artificially. It is important that you put your used needles and syringes in a special sharps container. Do not put them in a trash can. If you do not have a sharps container, call your pharmacist or care team to get one. Talk to your care team about the use of this medication in children. Special care may be needed. Overdosage: If you think you have taken too much of this medicine contact a poison control center or  emergency room at once. NOTE: This medicine is only for you. Do not share this medicine with others. What if I miss a dose? If you miss a dose, take it as soon as you can. If it is almost time for your next dose, take only that dose. Do not take double or extra doses. What may interact with this medication? Bromocriptine Certain medications for blood pressure, heart disease, irregular heartbeat Cyclosporine Diuretics Medications for diabetes, including insulin Quinidine This list may not describe all possible interactions. Give your health care provider a list of all the medicines, herbs, non-prescription drugs, or dietary supplements you use. Also tell them if you smoke, drink alcohol, or use illegal drugs. Some items may interact with your medicine. What should I watch for while using this medication? Visit your care team for regular checks on your progress. Tell your care team if your symptoms do not start to get better or if they get worse. To help reduce irritation at the injection site, use a different site for each injection and make sure the solution is at room temperature before use. This medication may cause decreases in blood sugar. Signs of low blood sugar include chills, cool, pale skin or cold sweats, drowsiness, extreme hunger, fast heartbeat, headache, nausea, nervousness or anxiety, shakiness, trembling, unsteadiness, tiredness, or weakness. Contact your care team right away if you experience any of these symptoms. This medication may increase blood sugar. The risk may be higher in patients who already have diabetes. Ask your care team what you can do to lower your   risk of diabetes while taking this medication. You should make sure you get enough vitamin B12 while you are taking this medication. Discuss the foods you eat and the vitamins you take with your care team. What side effects may I notice from receiving this medication? Side effects that you should report to your care  team as soon as possible: Allergic reactions--skin rash, itching, hives, swelling of the face, lips, tongue, or throat Gallbladder problems--severe stomach pain, nausea, vomiting, fever Heart rhythm changes--fast or irregular heartbeat, dizziness, feeling faint or lightheaded, chest pain, trouble breathing High blood sugar (hyperglycemia)--increased thirst or amount of urine, unusual weakness or fatigue, blurry vision Low blood sugar (hypoglycemia)--tremors or shaking, anxiety, sweating, cold or clammy skin, confusion, dizziness, rapid heartbeat Low thyroid levels (hypothyroidism)--unusual weakness or fatigue, increased sensitivity to cold, constipation, hair loss, dry skin, weight gain, feelings of depression Low vitamin B12 level--pain, tingling, or numbness in the hands or feet, muscle weakness, dizziness, confusion, trouble concentrating Pancreatitis--severe stomach pain that spreads to your back or gets worse after eating or when touched, fever, nausea, vomiting Side effects that usually do not require medical attention (report to your care team if they continue or are bothersome): Diarrhea Dizziness Gas Headache Pain, redness, or irritation at injection site Stomach pain This list may not describe all possible side effects. Call your doctor for medical advice about side effects. You may report side effects to FDA at 1-800-FDA-1088. Where should I keep my medication? Keep out of the reach of children and pets. Store in the refrigerator. Protect from light. Allow to come to room temperature naturally. Do not use artificial heat. If protected from light, the injection may be stored between 20 and 30 degrees C (70 and 86 degrees F) for 14 days. After the initial use, throw away any unused portion of a multiple dose vial after 14 days. Get rid of any unused portions of the ampules after use. To get rid of medications that are no longer needed or have expired: Take the medication to a medication  take-back program. Ask your pharmacy or law enforcement to find a location. If you cannot return the medication, ask your pharmacist or care team how to get rid of the medication safely. NOTE: This sheet is a summary. It may not cover all possible information. If you have questions about this medicine, talk to your doctor, pharmacist, or health care provider.  2023 Elsevier/Gold Standard (2021-07-28 00:00:00)  

## 2022-09-21 NOTE — Progress Notes (Signed)
[facility] OFFICE PROGRESS NOTE   Diagnosis: Pancreas masses  INTERVAL HISTORY:   Jake Dunn returns as scheduled.  He began monthly Sandostatin 08/24/2022.  He continues to have nausea but notes his home nausea medication helps.  No diarrhea.  He is having periodic sweating episodes over the past 2 to 3 weeks.  No fever.  He has lost weight since his last office visit.  He has chronic back pain which has worsened recently.  He has an appointment with Dr. Danielle Dess later this week.  Objective:  Vital signs in last 24 hours:  Blood pressure 120/77, pulse 88, temperature 98.1 F (36.7 C), temperature source Oral, resp. rate 18, height 5\' 7"  (1.702 m), weight 221 lb (100.2 kg), SpO2 100 %.    HEENT: No thrush or ulcers. Resp: Lungs clear bilaterally. Cardio: Regular rate and rhythm. GI: Abdomen soft and nontender.  No hepatosplenomegaly. Vascular: No leg edema.   Lab Results:  Lab Results  Component Value Date   WBC 6.3 06/07/2022   HGB 12.0 (L) 06/07/2022   HCT 35.4 (L) 06/07/2022   MCV 96.5 06/07/2022   PLT 205 06/07/2022   NEUTROABS 5.1 12/20/2020    Imaging:  No results found.  Medications: I have reviewed the patient's current medications.  Assessment/Plan: Pancreas masses CT chest 06/05/2022-10 mm right hilar node, 9 mm central right upper lobe nodule, 2 contiguous lobulated masses in the central abdomen, left nephrectomy MRI abdomen 06/06/2022-2 adjacent hypervascular masses in the central pancreas with associated ductal dilatation and atrophy, probable prominent lymph node superior to the right mass, the right-sided mass causes mass effect at the celiac axis and portal vein Upper EUS 06/13/2022-mass identified in the pancreatic body, FNA performed, vascular involvement suspected.  Second similar smaller mass seen immediately adjacent to the larger mass.  Cytology-malignant cells present, solid pseudopapillary neoplasm favored PET scan 07/04/2022-mild  increased FDG uptake associated with the dominant mass centered around the pancreatic neck.  Also mild increased uptake associated with the mass within the tail of the pancreas.  Solid-appearing lesion or lymph node within the gastrohepatic ligament which directly abuts the superior margin of the dominant pancreatic mass, also exhibits mild increased FDG uptake.  Mild FDG uptake associated with a right upper lobe lung nodule EUS 07/27/2022-"Twin" masses identified in the pancreas body, no pathology in the left lobe of the liver or gallbladder, no adenopathy, FNA biopsy-"atypical cells present "-could not be further characterize due to lack of cellularity on cell block Dotatate PET 07/28/2022-moderate radiotracer activity associated with pancreatic lesions including a small pancreas tail nodule consistent with well-differentiated versus moderately differentiated neuroendocrine tumor, no metastatic adenopathy or distant metastatic disease Chromogranin A elevated on 08/15/2022 Monthly Sandostatin initiated 08/24/2022 2.   Remote history of renal cell carcinoma, status post a left nephrectomy 03/16/1994 status post left nephrectomy, renal cell carcinoma, pT2, pNX, pMX, tumor characterized by sheets and clusters of neoplastic clear to granular cells, consistent with a renal cell carcinoma grade 2, no definite diagnostic vascular invasion of larger vessels, tumor does not appear to extend through the renal capsule. 3.   Atrial fibrillation 4.   Coronary artery disease, coronary artery bypass surgery in 2020 5.   History of CVA 6.   Multiple spine surgeries 7.   Left upper lobe nodule and borderline enlarged right hilar node on chest CT 06/05/2022 8.   Gout 9.   History of adenomatous polyps 10.  Family history of pancreas cancer 11.  Right upper lobe nodule  with mild FDG uptake on PET 07/04/2022  Disposition: [name] appears stable.  He began monthly Sandostatin 08/24/2022.  No significant change in the nausea  thus far.  Plan to proceed with Sandostatin today as scheduled.  He began having intermittent sweating episodes after beginning Sandostatin.  It is possible the sweats are related to Sandostatin.  He agrees to proceed as above.  Etiology of the weight loss is unclear.  We will continue to monitor.  He will contact the office prior to his next visit with continued weight loss.  He will return for lab, follow-up, Sandostatin in 4 weeks.  We are available to see him sooner if needed.   Lonna Cobb ANP/GNP-BC   09/21/2022  9:27 AM

## 2022-09-23 DIAGNOSIS — M48062 Spinal stenosis, lumbar region with neurogenic claudication: Secondary | ICD-10-CM | POA: Diagnosis not present

## 2022-09-23 DIAGNOSIS — Z6835 Body mass index (BMI) 35.0-35.9, adult: Secondary | ICD-10-CM | POA: Diagnosis not present

## 2022-09-30 ENCOUNTER — Other Ambulatory Visit (HOSPITAL_COMMUNITY): Payer: Self-pay | Admitting: Neurological Surgery

## 2022-09-30 DIAGNOSIS — M48062 Spinal stenosis, lumbar region with neurogenic claudication: Secondary | ICD-10-CM

## 2022-10-02 ENCOUNTER — Ambulatory Visit (HOSPITAL_COMMUNITY)
Admission: RE | Admit: 2022-10-02 | Discharge: 2022-10-02 | Disposition: A | Discharge status: Home / Self Care | Payer: Medicare Other | Source: Ambulatory Visit | Attending: Neurological Surgery | Admitting: Neurological Surgery

## 2022-10-02 DIAGNOSIS — M48062 Spinal stenosis, lumbar region with neurogenic claudication: Secondary | ICD-10-CM | POA: Insufficient documentation

## 2022-10-02 DIAGNOSIS — M4326 Fusion of spine, lumbar region: Secondary | ICD-10-CM | POA: Diagnosis not present

## 2022-10-02 MED ORDER — GADOBUTROL 1 MMOL/ML IV SOLN
10.0000 mL | Freq: Once | INTRAVENOUS | Status: AC | PRN
  Administered 2022-10-02: 10 mL via INTRAVENOUS

## 2022-10-07 DIAGNOSIS — M48062 Spinal stenosis, lumbar region with neurogenic claudication: Secondary | ICD-10-CM | POA: Diagnosis not present

## 2022-10-20 ENCOUNTER — Inpatient Hospital Stay: Payer: Medicare Other

## 2022-10-20 ENCOUNTER — Inpatient Hospital Stay: Payer: Medicare Other | Admitting: Oncology

## 2022-10-20 ENCOUNTER — Inpatient Hospital Stay: Payer: Medicare Other | Attending: Nurse Practitioner

## 2022-10-20 VITALS — BP 119/74 | HR 84 | Temp 98.1°F | Resp 18 | Ht 67.0 in | Wt 223.0 lb

## 2022-10-20 DIAGNOSIS — M109 Gout, unspecified: Secondary | ICD-10-CM | POA: Insufficient documentation

## 2022-10-20 DIAGNOSIS — I4891 Unspecified atrial fibrillation: Secondary | ICD-10-CM | POA: Insufficient documentation

## 2022-10-20 DIAGNOSIS — Z8673 Personal history of transient ischemic attack (TIA), and cerebral infarction without residual deficits: Secondary | ICD-10-CM | POA: Insufficient documentation

## 2022-10-20 DIAGNOSIS — C7A098 Malignant carcinoid tumors of other sites: Secondary | ICD-10-CM | POA: Insufficient documentation

## 2022-10-20 DIAGNOSIS — E340 Carcinoid syndrome: Secondary | ICD-10-CM | POA: Diagnosis not present

## 2022-10-20 DIAGNOSIS — Z85528 Personal history of other malignant neoplasm of kidney: Secondary | ICD-10-CM | POA: Diagnosis not present

## 2022-10-20 DIAGNOSIS — C7A8 Other malignant neuroendocrine tumors: Secondary | ICD-10-CM

## 2022-10-20 DIAGNOSIS — Z905 Acquired absence of kidney: Secondary | ICD-10-CM | POA: Diagnosis not present

## 2022-10-20 DIAGNOSIS — Z800 Family history of malignant neoplasm of digestive organs: Secondary | ICD-10-CM | POA: Diagnosis not present

## 2022-10-20 DIAGNOSIS — R911 Solitary pulmonary nodule: Secondary | ICD-10-CM | POA: Diagnosis not present

## 2022-10-20 DIAGNOSIS — R0609 Other forms of dyspnea: Secondary | ICD-10-CM | POA: Insufficient documentation

## 2022-10-20 LAB — CBC WITH DIFFERENTIAL (CANCER CENTER ONLY)
Abs Immature Granulocytes: 0.17 10*3/uL — ABNORMAL HIGH (ref 0.00–0.07)
Basophils Absolute: 0.1 10*3/uL (ref 0.0–0.1)
Basophils Relative: 1 %
Eosinophils Absolute: 0.1 10*3/uL (ref 0.0–0.5)
Eosinophils Relative: 1 %
HCT: 41.2 % (ref 39.0–52.0)
Hemoglobin: 13.7 g/dL (ref 13.0–17.0)
Immature Granulocytes: 2 %
Lymphocytes Relative: 13 %
Lymphs Abs: 1.1 10*3/uL (ref 0.7–4.0)
MCH: 32.2 pg (ref 26.0–34.0)
MCHC: 33.3 g/dL (ref 30.0–36.0)
MCV: 96.7 fL (ref 80.0–100.0)
Monocytes Absolute: 1 10*3/uL (ref 0.1–1.0)
Monocytes Relative: 12 %
Neutro Abs: 5.9 10*3/uL (ref 1.7–7.7)
Neutrophils Relative %: 71 %
Platelet Count: 256 10*3/uL (ref 150–400)
RBC: 4.26 MIL/uL (ref 4.22–5.81)
RDW: 15 % (ref 11.5–15.5)
WBC Count: 8.3 10*3/uL (ref 4.0–10.5)
nRBC: 0 % (ref 0.0–0.2)

## 2022-10-20 LAB — CMP (CANCER CENTER ONLY)
ALT: 12 U/L (ref 0–44)
AST: 9 U/L — ABNORMAL LOW (ref 15–41)
Albumin: 4 g/dL (ref 3.5–5.0)
Alkaline Phosphatase: 76 U/L (ref 38–126)
Anion gap: 7 (ref 5–15)
BUN: 21 mg/dL (ref 8–23)
CO2: 25 mmol/L (ref 22–32)
Calcium: 9.1 mg/dL (ref 8.9–10.3)
Chloride: 105 mmol/L (ref 98–111)
Creatinine: 1.34 mg/dL — ABNORMAL HIGH (ref 0.61–1.24)
GFR, Estimated: 54 mL/min — ABNORMAL LOW (ref >60)
Glucose, Bld: 122 mg/dL — ABNORMAL HIGH (ref 70–99)
Potassium: 4.5 mmol/L (ref 3.5–5.1)
Sodium: 137 mmol/L (ref 135–145)
Total Bilirubin: 0.6 mg/dL (ref 0.3–1.2)
Total Protein: 6.8 g/dL (ref 6.5–8.1)

## 2022-10-20 MED ORDER — OCTREOTIDE ACETATE 20 MG IM KIT
20.0000 mg | PACK | Freq: Once | INTRAMUSCULAR | Status: AC
  Administered 2022-10-20: 20 mg via INTRAMUSCULAR
  Filled 2022-10-20: qty 1

## 2022-10-20 NOTE — Progress Notes (Signed)
[facility] OFFICE PROGRESS NOTE   Diagnosis: Pancreas neuroendocrine tumor  INTERVAL HISTORY:   Mr. Koker returns as scheduled.  He began monthly Sandostatin on 08/24/2022.  No flushing or diarrhea.  He has intermittent "sweats "in the evening.  He reports marked improvement in nausea.  He complains of exertional dyspnea.  The dyspnea has been worse over the past several weeks.  The dyspnea feels similar to coronary artery bypass surgery in 2020.  Mild cough.  No wheezing.  No fever.  Objective:  Vital signs in last 24 hours:  Blood pressure 119/74, pulse 84, temperature 98.1 F (36.7 C), temperature source Oral, resp. rate 18, height 5\' 7"  (1.702 m), weight 223 lb (101.2 kg), SpO2 100 %.   Resp: Coarse inspiratory rhonchi at the right posterior base, no respiratory distress Cardio: Irregular GI: No hepatosplenomegaly, no mass, nontender Vascular: No leg edema   Lab Results:  Lab Results  Component Value Date   WBC 8.3 10/20/2022   HGB 13.7 10/20/2022   HCT 41.2 10/20/2022   MCV 96.7 10/20/2022   PLT 256 10/20/2022   NEUTROABS 5.9 10/20/2022    CMP  Lab Results  Component Value Date   NA 137 10/20/2022   K 4.5 10/20/2022   CL 105 10/20/2022   CO2 25 10/20/2022   GLUCOSE 122 (H) 10/20/2022   BUN 21 10/20/2022   CREATININE 1.34 (H) 10/20/2022   CALCIUM 9.1 10/20/2022   PROT 6.8 10/20/2022   ALBUMIN 4.0 10/20/2022   AST 9 (L) 10/20/2022   ALT 12 10/20/2022   ALKPHOS 76 10/20/2022   BILITOT 0.6 10/20/2022   GFRNONAA 54 (L) 10/20/2022   GFRAA 66 11/21/2019    Lab Results  Component Value Date   OZH086 10 06/16/2022     Medications: I have reviewed the patient's current medications.   Assessment/Plan: Pancreas masses CT chest 06/05/2022-10 mm right hilar node, 9 mm central right upper lobe nodule, 2 contiguous lobulated masses in the central abdomen, left nephrectomy MRI abdomen 06/06/2022-2 adjacent hypervascular masses in the central  pancreas with associated ductal dilatation and atrophy, probable prominent lymph node superior to the right mass, the right-sided mass causes mass effect at the celiac axis and portal vein Upper EUS 06/13/2022-mass identified in the pancreatic body, FNA performed, vascular involvement suspected.  Second similar smaller mass seen immediately adjacent to the larger mass.  Cytology-malignant cells present, solid pseudopapillary neoplasm favored PET scan 07/04/2022-mild increased FDG uptake associated with the dominant mass centered around the pancreatic neck.  Also mild increased uptake associated with the mass within the tail of the pancreas.  Solid-appearing lesion or lymph node within the gastrohepatic ligament which directly abuts the superior margin of the dominant pancreatic mass, also exhibits mild increased FDG uptake.  Mild FDG uptake associated with a right upper lobe lung nodule EUS 07/27/2022-"Twin" masses identified in the pancreas body, no pathology in the left lobe of the liver or gallbladder, no adenopathy, FNA biopsy-"atypical cells present "-could not be further characterize due to lack of cellularity on cell block Dotatate PET 07/28/2022-moderate radiotracer activity associated with pancreatic lesions including a small pancreas tail nodule consistent with well-differentiated versus moderately differentiated neuroendocrine tumor, no metastatic adenopathy or distant metastatic disease Chromogranin A elevated on 08/15/2022 Monthly Sandostatin initiated 08/24/2022 2.   Remote history of renal cell carcinoma, status post a left nephrectomy 03/16/1994 status post left nephrectomy, renal cell carcinoma, pT2, pNX, pMX, tumor characterized by sheets and clusters of neoplastic clear to granular cells,  consistent with a renal cell carcinoma grade 2, no definite diagnostic vascular invasion of larger vessels, tumor does not appear to extend through the renal capsule. 3.   Atrial fibrillation 4.   Coronary  artery disease, coronary artery bypass surgery in 2020 5.   History of CVA 6.   Multiple spine surgeries 7.   Left upper lobe nodule and borderline enlarged right hilar node on chest CT 06/05/2022 8.   Gout 9.   History of adenomatous polyps 10.  Family history of pancreas cancer 11.  Right upper lobe nodule with mild FDG uptake on PET 07/04/2022    Disposition: Mr. Borner is being treated with Sandostatin for a probable pancreas neuroendocrine tumor.  GI symptoms have improved.  Appears to be tolerating Sandostatin well.  He will complete another treatment today.  He will continue monthly Sandostatin.  Mr. Dyas will be scheduled for an office visit in 2 months.  He plans to follow-up with cardiology to evaluate the exertional dyspnea.  I suspect the dyspnea is related to his heart or COPD.  Thornton Papas, MD  10/20/2022  8:58 AM

## 2022-10-21 LAB — CHROMOGRANIN A: Chromogranin A (ng/mL): 56.9 ng/mL (ref 0.0–101.8)

## 2022-10-25 ENCOUNTER — Telehealth: Payer: Self-pay | Admitting: Cardiology

## 2022-10-25 DIAGNOSIS — J449 Chronic obstructive pulmonary disease, unspecified: Secondary | ICD-10-CM | POA: Diagnosis not present

## 2022-10-25 DIAGNOSIS — I480 Paroxysmal atrial fibrillation: Secondary | ICD-10-CM | POA: Diagnosis not present

## 2022-10-25 DIAGNOSIS — R0609 Other forms of dyspnea: Secondary | ICD-10-CM | POA: Diagnosis not present

## 2022-10-25 DIAGNOSIS — I110 Hypertensive heart disease with heart failure: Secondary | ICD-10-CM | POA: Diagnosis not present

## 2022-10-25 DIAGNOSIS — R55 Syncope and collapse: Secondary | ICD-10-CM | POA: Insufficient documentation

## 2022-10-25 NOTE — Telephone Encounter (Signed)
Pt c/o Syncope: STAT if syncope occurred within 30 minutes and pt complains of lightheadedness High Priority if episode of passing out, completely, today or in last 24 hours   Did you pass out today?  No   When is the last time you passed out?  Yesterday while seated at the barber shop  Has this occurred multiple times?  Yes, patient's wife states the patient almost passed out this morning    Did you have any symptoms prior to passing out?   SOB, nausea

## 2022-10-25 NOTE — Telephone Encounter (Signed)
Patient felt like he was going to pass out this morning. He had just gotten out of bed and was walking around and was fine.  Went to bathroom got up  and approx  5-10 minutes and broke out in a sweat, Was nauseated, dizzy.  Had to sit down with wet rag on neck and face. Lasted approx 10-15 minutes.  Yesterday patient was at the barber. He was sitting in barber chair 10-15.  He broke out in a sweat, nausea and loss consciousness. States out just a few minutes.  Ems was called. They did EKG, BG and checked BP and HR. Nothing of concern found. Patient did not want to go to the the hospital in [place].  Patient states SOB last 3 days.  Woke up night before incident at barber shop he had "awful chest pain" took nitroglycerin X 2 and resolved.  He states he almost went to hospital but did not. Did not have SOB or chest pain at time of the 2 episodes of almost and passing out.  Patient states seeing PCP today and chest xray ordered . He states PCP and EMS state to see cardiology. He is scheduled with DOD for Thursday.  Advised if he has another episode of passing out or severe chest pain, then he is to go to ED. Patient states understanding and agrees.

## 2022-10-25 NOTE — Progress Notes (Unsigned)
Cardiology Office Note:   Date:  10/27/2022  ID:  Dunn, Jake Mar 31, 1941, MRN [id number] PCP: Laurann Montana, MD  [facility] Providers Cardiologist:  [name], MD {  History of Present Illness:   Jake Dunn is a 82 y.o. male who is seen for follow up CAD s/p CABG. He presented with left sided chest pressure on 08/15/2016. He underwent cardiac catheterization on 08/16/2016, this revealed two-vessel CAD with moderate calcified stenosis in the mid LAD, second diagonal, ramus intermedius, ostial and proximal left circumflex and a severe stenosis of the second PLA branch of the RCA. Overall normal LVEDP and normal LVEF. Recommended  medical therapy and outpatient exercise stress Myoview. While the LAD and the diagonal only showed moderate progression compared to the 2010/2014 studies, the left circumflex and intermediate stenosis had progressed. Echocardiogram obtained on 08/17/2016 showed EF 55-60%, grade 2 diastolic dysfunction, severely dilated left atrium, PA peak pressure 30 mmHg. Lexiscan Myoview obtained on 08/23/2016 showed EF 51%, no perfusion defect at stress, overall low risk study.  He was seen in November 2020 with symptoms of chest pain and dyspnea. This led to a cardiac cath showing 3 vessel obstructive CAD. He underwent CABG on 04/02/19 by Dr Cliffton Asters. CABG X 4.  LIMA LAD, RSVG PLV, Ramus, and D2  (T graft off the ramus vein graft). Post op course complicated by Afib. He was loaded with amiodarone. On his  visit in January 2021 amiodarone and Eliquis were discontinued.   He was admitted in August 2022 with recurrent chest pain. Troponins were normal. He underwent cardiac cath which showed good perfusion and no culprit lesion. Subsequent Echo was unremarkable. He was admitted 1/26-1/31/24 after presenting with with waxing/waning chest pain similar to prior angina for several days. Due to persistent symptoms including DOE he came to the ER. Troponins were negative though  symptoms concerning for angina so patient admitted to cardiology service for plan for echo and cardiac cath. He was also noted to be in atrial fib, rate controlled, with plan to pursue DOAC following cath. Echo showed EF 50-55% with mild MR. Cardiac cath showed patent grafts with no clear culprit for his chest pain. He was started on Ranexa. He did convert to NSR and was DC on Eliquis. CT of the chest also showed a 4.5 cm thoracic aortic aneurysm. 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    He was added to my schedule today because of syncope.    He said he was sitting in the barber chair.  He developed sharp pain on the left side of his chest.  He then had sweating and frank syncope.  He said it was a heart pain similar to what he was having back in January.  He did not have any forewarning.  EMS came but they did not transport him.  He has not really felt much in the way of palpitations.  His biggest issue is he has been weak.  He is short of breath walking 20 yards on level ground.  He is not describing new PND or orthopnea.  Is not having any new weight gain or edema.  He rarely takes nitroglycerin.  ROS: As stated in the HPI and negative for all other systems.  Studies Reviewed:    EKG:   EKG Interpretation  Date/Time:  Thursday October 27 2022 09:20:56 EDT Ventricular Rate:  62 PR Interval:  198 QRS Duration: 92 QT Interval:  434 QTC Calculation: 440 R Axis:   26  Text Interpretation: Normal sinus rhythm Nonspecific ST and T wave abnormality When compared with ECG of 03-Jun-2022 10:55, rhythm is no longer atrial fib Confirmed by Rollene Rotunda ([id number]) on 10/27/2022 10:04:53 AM    Risk Assessment/Calculations:    CHA2DS2-VASc Score = 7   This indicates a 11.2% annual risk of stroke. The patient's score is based upon: CHF History: 1 HTN History: 1 Diabetes History: 0 Stroke History: 2 Vascular Disease History: 1 Age Score: 2 Gender Score: 0   Physical Exam:   VS:  BP (!)  142/82 (BP Location: Left Arm, Patient Position: Sitting, Cuff Size: Normal)   Pulse 69   Ht 5\' 7"  (1.702 m)   Wt 222 lb 3.2 oz (100.8 kg)   SpO2 95%   BMI 34.80 kg/m    Wt Readings from Last 3 Encounters:  10/27/22 222 lb 3.2 oz (100.8 kg)  10/20/22 223 lb (101.2 kg)  09/21/22 221 lb (100.2 kg)     GEN: Well nourished, well developed in no acute distress NECK: No JVD; No carotid bruits CARDIAC: RRR, no murmurs, rubs, gallops RESPIRATORY:  Clear to auscultation without rales, wheezing or rhonchi  ABDOMEN: Soft, non-tender, non-distended EXTREMITIES:  No edema; No deformity   ASSESSMENT AND PLAN:   CAD  s/p CABG x 4 on 04/02/19: He has some chest discomfort grafts.  Continue back isosorbide mononitrate 60 milligrams.  He was on this in 2020 and he had no contraindication but it was discontinued after surgery.    Atrial fibrillation post op CABG. tolerates anticoagulation.  Has had no symptomatic recurrence of atrial fibrillation.  Syncope: The patient was not orthostatic in the office today.  I am going to have him wear a 4-week monitor.  HTN: Blood pressure is at target.  No change in therapy other than above.   Chronic diastolic CHF.   He is short of breath.  I will check a BNP level.   Large abdominal mass. FNA c/w psuedopapillary neoplasm.        Follow up with Dr. Swaziland after the monitor    Signed, Rollene Rotunda, MD

## 2022-10-26 ENCOUNTER — Ambulatory Visit
Admission: RE | Admit: 2022-10-26 | Discharge: 2022-10-26 | Disposition: A | Discharge status: Home / Self Care | Payer: Medicare Other | Source: Ambulatory Visit | Attending: Family Medicine | Admitting: Family Medicine

## 2022-10-26 ENCOUNTER — Other Ambulatory Visit: Payer: Self-pay | Admitting: Family Medicine

## 2022-10-26 DIAGNOSIS — R0609 Other forms of dyspnea: Secondary | ICD-10-CM

## 2022-10-27 ENCOUNTER — Ambulatory Visit: Payer: Medicare Other | Attending: Cardiology | Admitting: Cardiology

## 2022-10-27 ENCOUNTER — Encounter: Payer: Self-pay | Admitting: *Deleted

## 2022-10-27 ENCOUNTER — Other Ambulatory Visit: Payer: Self-pay

## 2022-10-27 ENCOUNTER — Encounter: Payer: Self-pay | Admitting: Cardiology

## 2022-10-27 VITALS — BP 142/82 | HR 69 | Ht 67.0 in | Wt 222.2 lb

## 2022-10-27 DIAGNOSIS — I480 Paroxysmal atrial fibrillation: Secondary | ICD-10-CM

## 2022-10-27 DIAGNOSIS — I2510 Atherosclerotic heart disease of native coronary artery without angina pectoris: Secondary | ICD-10-CM | POA: Diagnosis not present

## 2022-10-27 DIAGNOSIS — I5032 Chronic diastolic (congestive) heart failure: Secondary | ICD-10-CM

## 2022-10-27 DIAGNOSIS — I10 Essential (primary) hypertension: Secondary | ICD-10-CM

## 2022-10-27 DIAGNOSIS — R55 Syncope and collapse: Secondary | ICD-10-CM

## 2022-10-27 MED ORDER — ISOSORBIDE MONONITRATE ER 60 MG PO TB24: 60.0000 mg | ORAL_TABLET | Freq: Every day | ORAL | 3 refills | Status: DC

## 2022-10-27 NOTE — Patient Instructions (Signed)
Medication Instructions:  Your physician has recommended you make the following change in your medication:  1) START taking Imdur (isosorbide mononitrate) 60 mg daily   *If you need a refill on your cardiac medications before your next appointment, please call your pharmacy*  Lab Work: TODAY: BNP  Testing/Procedures: Your physician has recommended that you wear an event monitor. Event monitors are medical devices that record the heart's electrical activity. Doctors most often [place] these monitors to diagnose arrhythmias. Arrhythmias are problems with the speed or rhythm of the heartbeat. The monitor is a small, portable device. You can wear one while you do your normal daily activities. This is usually used to diagnose what is causing palpitations/syncope (passing out).  Follow-Up: At [facility], you and your health needs are our priority.  As part of our continuing mission to provide you with exceptional heart care, we have created designated Provider Care Teams.  These Care Teams include your primary Cardiologist (physician) and Advanced Practice Providers (APPs -  Physician Assistants and Nurse Practitioners) who all work together to provide you with the care you need, when you need it.  Your next appointment:   8 week(s)  Provider:   [name], MD     Other Instructions:  [employer] Cardiac Event Monitor Instructions  Your physician has requested you wear your cardiac event monitor for 30 days. [employer] may call or text to confirm a shipping address. The monitor will be sent to a land address via UPS. [employer] will not ship a monitor to a PO BOX. It typically takes 3-5 days to receive your monitor after it has been enrolled. [employer] will assist with USPS tracking if your package is delayed. The telephone number for [employer] is [phone]. Once you have received your monitor, please review the enclosed instructions. Instruction tutorials can also be viewed  under help and settings on the enclosed cell phone. Your monitor has already been registered assigning a specific monitor serial # to you.  Billing and Self Pay Discount Information  [employer] has been provided the insurance information we had on file for you.  If your insurance has been updated, please call [employer] at [phone] to provide them with your updated insurance information.   [employer] offers a discounted Self Pay option for patients who have insurance that does not cover their cardiac event monitor or patients without insurance.  The discounted cost of a Self Pay Cardiac Event Monitor would be $225.00 , if the patient contacts [employer] at [phone] within 7 days of applying the monitor to make payment arrangements.  If the patient does not contact [employer] within 7 days of applying the monitor, the cost of the cardiac event monitor will be $350.00.  Applying the monitor  Remove cell phone from case and turn it on. The cell phone works as IT consultant and needs to be within [employer] of you at all times. The cell phone will need to be charged on a daily basis. We recommend you plug the cell phone into the enclosed charger at your bedside table every night.  Monitor batteries: You will receive two monitor batteries labelled #1 and #2. These are your recorders. Plug battery #2 onto the second connection on the enclosed charger. Keep one battery on the charger at all times. This will keep the monitor battery deactivated. It will also keep it fully charged for when you need to switch your monitor batteries. A small light will be blinking on the battery emblem when it is charging.  The light on the battery emblem will remain on when the battery is fully charged.  Open package of a Monitor strip. Insert battery #1 into black hood on strip and gently squeeze monitor battery onto connection as indicated in instruction booklet. Set aside while preparing skin.  Choose location  for your strip, vertical or horizontal, as indicated in the instruction booklet. Shave to remove all hair from location. There cannot be any lotions, oils, powders, or colognes on skin where monitor is to be applied. Wipe skin clean with enclosed Saline wipe. Dry skin completely.  Peel paper labeled #1 off the back of the Monitor strip exposing the adhesive. Place the monitor on the chest in the vertical or horizontal position shown in the instruction booklet. One arrow on the monitor strip must be pointing upward. Carefully remove paper labeled #2, attaching remainder of strip to your skin. Try not to create any folds or wrinkles in the strip as you apply it.  Firmly press and release the circle in the center of the monitor battery. You will hear a small beep. This is turning the monitor battery on. The heart emblem on the monitor battery will light up every 5 seconds if the monitor battery in turned on and connected to the patient securely. Do not push and hold the circle down as this turns the monitor battery off. The cell phone will locate the monitor battery. A screen will appear on the cell phone checking the connection of your monitor strip. This may read poor connection initially but change to good connection within the next minute. Once your monitor accepts the connection you will hear a series of 3 beeps followed by a climbing crescendo of beeps. A screen will appear on the cell phone showing the two monitor strip placement options. Touch the picture that demonstrates where you applied the monitor strip.  Your monitor strip and battery are waterproof. You are able to shower, bathe, or swim with the monitor on. They just ask you do not submerge deeper than 3 feet underwater. We recommend removing the monitor if you are swimming in a lake, river, or ocean.  Your monitor battery will need to be switched to a fully charged monitor battery approximately once a week. The cell phone will  alert you of an action which needs to be made.  On the cell phone, tap for details to reveal connection status, monitor battery status, and cell phone battery status. The green dots indicates your monitor is in good status. A red dot indicates there is something that needs your attention.  To record a symptom, click the circle on the monitor battery. In 30-60 seconds a list of symptoms will appear on the cell phone. Select your symptom and tap save. Your monitor will record a sustained or significant arrhythmia regardless of you clicking the button. Some patients do not feel the heart rhythm irregularities. [employer] will notify us of any serious or critical events.  Refer to instruction booklet for instructions on switching batteries, changing strips, the Do not disturb or Pause features, or any additional questions.  Call [employer] at [phone], to confirm your monitor is transmitting and record your baseline. They will answer any questions you may have regarding the monitor instructions at that time.  Returning the monitor to [employer]  Place all equipment back into blue box. Peel off strip of paper to expose adhesive and close box securely. There is a prepaid UPS shipping label on this box. Drop in a UPS drop  box, or at a UPS facility like [employer]. You may also contact [employer] to arrange UPS to pick up monitor package at your home.

## 2022-10-27 NOTE — Progress Notes (Unsigned)
Patient ID: Jake Dunn, male   DOB: Nov 20, 1940, 82 y.o.   MRN: [id number] Patient enrolled for [employer] to ship a 30 day cardiac event monitor to his address on file.

## 2022-10-28 LAB — BRAIN NATRIURETIC PEPTIDE: BNP: 149.9 pg/mL — ABNORMAL HIGH (ref 0.0–100.0)

## 2022-11-03 DIAGNOSIS — I10 Essential (primary) hypertension: Secondary | ICD-10-CM | POA: Diagnosis not present

## 2022-11-03 DIAGNOSIS — I2510 Atherosclerotic heart disease of native coronary artery without angina pectoris: Secondary | ICD-10-CM

## 2022-11-03 DIAGNOSIS — R55 Syncope and collapse: Secondary | ICD-10-CM

## 2022-11-03 DIAGNOSIS — I480 Paroxysmal atrial fibrillation: Secondary | ICD-10-CM | POA: Diagnosis not present

## 2022-11-03 DIAGNOSIS — I5032 Chronic diastolic (congestive) heart failure: Secondary | ICD-10-CM

## 2022-11-10 NOTE — Progress Notes (Signed)
Cardiology Office Note   Date:  11/14/2022   ID:  Jake Dunn 1941/02/26, MRN [id number]  PCP:  Laurann Montana, MD  Cardiologist:   Aryia Delira [place], MD   Chief Complaint  Patient presents with   Shortness of Breath   Coronary Artery Disease      History of Present Illness: Jake Dunn is a 82 y.o. male who is seen for follow up CAD s/p CABG. He has a  PMH of CAD, GERD, HTN, HLD, renal cell CA s/p left nephrectomy. Cardiac catheterization in 2014 showed nonobstructive CAD with 70% lesion in third diagonal that was treated medically.    He presented with left sided chest pressure on 08/15/2016. He underwent cardiac catheterization on 08/16/2016, this revealed two-vessel CAD with moderate calcified stenosis in the mid LAD, second diagonal, ramus intermedius, ostial and proximal left circumflex and a severe stenosis of the second PLA branch of the RCA. Overall normal LVEDP and normal LVEF. Recommended  medical therapy and outpatient exercise stress Myoview. While the LAD and the diagonal only showed moderate progression compared to the 2010/2014 studies, the left circumflex and intermediate stenosis had progressed. Echocardiogram obtained on 08/17/2016 showed EF 55-60%, grade 2 diastolic dysfunction, severely dilated left atrium, PA peak pressure 30 mmHg. Lexiscan Myoview obtained on 08/23/2016 showed EF 51%, no perfusion defect at stress, overall low risk study.     He was seen in November 2020 with symptoms of chest pain and dyspnea. This led to a cardiac cath showing 3 vessel obstructive CAD. He underwent CABG on 04/02/19 by Dr Cliffton Asters. CABG X 4.  LIMA LAD, RSVG PLV, Ramus, and D2  (T graft off the ramus vein graft). Post op course complicated by Afib. He was loaded with amiodarone. On his  visit in January 2021 amiodarone and Eliquis were discontinued.   He did have a sleep study showing few hyponeic episodes without frank apnea. Did not meet criteria for CPAP trial.   He was  admitted in August 2022 with recurrent chest pain. Troponins were normal. He underwent cardiac cath which showed good perfusion and no culprit lesion. Subsequent Echo was unremarkable.   He was admitted 1/26-1/31/24 after presenting with with waxing/waning chest pain similar to prior angina for several days. Due to persistent symptoms including DOE he came to the ER. Troponins were negative though symptoms concerning for angina so patient admitted to cardiology service for plan for echo and cardiac cath. He was also noted to be in atrial fib, rate controlled. Echo showed EF 50-55% with mild MR. Cardiac cath showed patent grafts with no clear culprit for his chest pain. He was started on Ranexa. He did convert to NSR and was DC on Eliquis. CT of the chest also showed a 4.5 cm thoracic aortic aneurysm. There was 2 large hypervascular masses in the abdomen/pancreas concerning for Cancer. Unable to do needle biopsy so outpatient ERCP recommended. 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 Subsequent evaluation c/w neuroendocrine tumor. Treated with Sandostatin- followed by Dr Truett Perna.   He was seen as a work in in June by Dr Antoine Poche for syncope sitting in a Animal nutritionist. Event monitor placed. He also complained of SOB. BNP level was 150. States he has several spells a week where he gets acutely nauseated, sweating, lightheaded where he feels like he will pass out. Heart may race. He is SOB a lot. No cough. No chest pain.    Past Medical History:  Diagnosis Date  Adenomatous polyp    Anxiety    Arthritis    "knees, ankles, shoulders" (08/15/2016)   CAD (coronary artery disease)    s/p cath in 2014 showing significant stenosis in the diagonal side branches and in the RV marginal branch. These vessels were small and diffusely diseased. He is managed medically. 08/16/16 Cath, no disease progression   Chronic lower back pain    Disc disease, degenerative, cervical    GERD  (gastroesophageal reflux disease)    Gout    Hyperlipidemia    Hypertension    Osteoarthritis    Renal cell carcinoma    left kidney removal   Stroke [facility])    MINI STROKE 15+ YRS AGO, no residual   Stroke (HCC)    "I've had 2 or 3"; denies residual on 08/15/2016   Syncope and collapse     Past Surgical History:  Procedure Laterality Date   ANTERIOR CERVICAL DECOMP/DISCECTOMY FUSION  02/2004; 04/2005   Hattie Perch 09/20/2010; Hattie Perch 09/20/2010   APPLICATION OF ROBOTIC ASSISTANCE FOR SPINAL PROCEDURE N/A 01/02/2018   Procedure: APPLICATION OF ROBOTIC ASSISTANCE FOR SPINAL PROCEDURE;  Surgeon: Barnett Abu, MD;  Location: [facility] OR;  Service: Neurosurgery;  Laterality: N/A;   BACK SURGERY     CARDIAC CATHETERIZATION  12/14/2008   ef 50-55%. SHOWED SIGNIFICANT STENOSIS AND TO DIAGONAL SIDE BRANCHES AND IN THE RIGHT VENTRICULAR MARGINAL BRANCH. THESE VESSELS WERE SMALL AND DIFFUSELY DISEASED   CARDIOVASCULAR STRESS TEST  12/10/2009   EF 61%. EKG negative. Mild peri ischemia. Managed medically.    CARPAL TUNNEL RELEASE Right 11/2005   Hattie Perch 09/20/2010   CARPAL TUNNEL RELEASE Left 06/2007   Hattie Perch 5/132012   COLONOSCOPY  11/2004   Hattie Perch 09/20/2010   COLONOSCOPY W/ BIOPSIES AND POLYPECTOMY  09/2002   Hattie Perch 09/20/2010   CORONARY ARTERY BYPASS GRAFT N/A 04/02/2019   Procedure: CORONARY ARTERY BYPASS GRAFTING (CABG) using LIMA to LAD; Endoscopically harvested right greater saphenous vein to the PLB, Ramus, and Diag 2.;  Surgeon: Corliss Skains, MD;  Location: [facility] OR;  Service: Open Heart Surgery;  Laterality: N/A;   ESOPHAGOGASTRODUODENOSCOPY (EGD) WITH PROPOFOL N/A 06/13/2022   Procedure: ESOPHAGOGASTRODUODENOSCOPY (EGD) WITH PROPOFOL;  Surgeon: Willis Modena, MD;  Location: [facility]COPY;  Service: Gastroenterology;  Laterality: N/A;   ESOPHAGOGASTRODUODENOSCOPY (EGD) WITH PROPOFOL Bilateral 07/27/2022   Procedure: ESOPHAGOGASTRODUODENOSCOPY (EGD) WITH PROPOFOL;  Surgeon: Willis Modena, MD;   Location: [facility];  Service: Gastroenterology;  Laterality: Bilateral;   EUS Bilateral 07/27/2022   Procedure: UPPER ENDOSCOPIC ULTRASOUND (EUS) LINEAR;  Surgeon: Willis Modena, MD;  Location: [facility];  Service: Gastroenterology;  Laterality: Bilateral;   FINE NEEDLE ASPIRATION N/A 06/13/2022   Procedure: FINE NEEDLE ASPIRATION (FNA) LINEAR;  Surgeon: Willis Modena, MD;  Location: [facility];  Service: Gastroenterology;  Laterality: N/A;   FINE NEEDLE ASPIRATION Bilateral 07/27/2022   Procedure: FINE NEEDLE ASPIRATION (FNA) LINEAR;  Surgeon: Willis Modena, MD;  Location: [facility];  Service: Gastroenterology;  Laterality: Bilateral;   INCISION AND DRAINAGE ABSCESS Right 10/18/2016   Procedure: INCISION AND DRAINAGE ABSCESS RIGHT SHOULDER;  Surgeon: Sheral Apley, MD;  Location: [facility];  Service: Orthopedics;  Laterality: Right;   INCISION AND DRAINAGE OF WOUND Right 08/2005   shoulder/notes 09/20/2010   IR FLUORO GUIDE CV LINE LEFT  10/19/2016   IR US GUIDE VASC ACCESS LEFT  10/19/2016   IRRIGATION AND DEBRIDEMENT SHOULDER Right 12/22/2016   Procedure: IRRIGATION AND DEBRIDEMENT RIGHT SHOULDER;  Surgeon: Loreta Ave, MD;  Location: [facility]  OR;  Service: Orthopedics;  Laterality: Right;   JOINT REPLACEMENT     LEFT HEART CATH AND CORONARY ANGIOGRAPHY N/A 08/16/2016   Procedure: Left Heart Cath and Coronary Angiography;  Surgeon: Tonny Bollman, MD;  Location: [facility] INVASIVE CV LAB;  Service: Cardiovascular;  Laterality: N/A;   LEFT HEART CATH AND CORONARY ANGIOGRAPHY N/A 03/26/2019   Procedure: LEFT HEART CATH AND CORONARY ANGIOGRAPHY;  Surgeon: Dunn, Biannca Scantlin M, MD;  Location: [facility] INVASIVE CV LAB;  Service: Cardiovascular;  Laterality: N/A;   LEFT HEART CATH AND CORS/GRAFTS ANGIOGRAPHY N/A 12/21/2020   Procedure: LEFT HEART CATH AND CORS/GRAFTS ANGIOGRAPHY;  Surgeon: Dunn, Murice Barbar M, MD;  Location: [facility] INVASIVE CV LAB;  Service: Cardiovascular;  Laterality: N/A;    LEFT HEART CATH AND CORS/GRAFTS ANGIOGRAPHY N/A 06/03/2022   Procedure: LEFT HEART CATH AND CORS/GRAFTS ANGIOGRAPHY;  Surgeon: Tonny Bollman, MD;  Location: [facility] INVASIVE CV LAB;  Service: Cardiovascular;  Laterality: N/A;   LEFT HEART CATHETERIZATION WITH CORONARY ANGIOGRAM N/A 08/09/2012   Procedure: LEFT HEART CATHETERIZATION WITH CORONARY ANGIOGRAM;  Surgeon: Michi Herrmann M [place], MD;  Location: [facility] CATH LAB;  Service: Cardiovascular;  Laterality: N/A;   LUMBAR FUSION     NEPHRECTOMY Left early 2000s   REPLACEMENT TOTAL KNEE Right 08/2008   Hattie Perch 09/07/2010   SHOULDER ARTHROSCOPY WITH ROTATOR CUFF REPAIR Right 06/2005   Hattie Perch 09/20/2010   TOTAL KNEE ARTHROPLASTY  06/08/2011   Procedure: TOTAL KNEE ARTHROPLASTY;  Surgeon: Loreta Ave, MD;  Location: [facility] OR;  Service: Orthopedics;  Laterality: Left;   TOTAL SHOULDER ARTHROPLASTY Right 04/06/2016   Procedure: RIGHT REVERSE TOTAL SHOULDER ARTHROPLASTY;  Surgeon: Loreta Ave, MD;  Location: [facility] OR;  Service: Orthopedics;  Laterality: Right;   TOTAL SHOULDER REVISION Right 12/22/2016   Procedure: RIGHT SHOULDER GLENOID AND HUMERAL COMPONENT REVISION;  Surgeon: Loreta Ave, MD;  Location: [facility] OR;  Service: Orthopedics;  Laterality: Right;   UPPER ESOPHAGEAL ENDOSCOPIC ULTRASOUND (EUS) Bilateral 06/13/2022   Procedure: UPPER ESOPHAGEAL ENDOSCOPIC ULTRASOUND (EUS);  Surgeon: Willis Modena, MD;  Location: [facility]COPY;  Service: Gastroenterology;  Laterality: Bilateral;   US ECHOCARDIOGRAPHY  12/15/2008   EF 60-65%   US ECHOCARDIOGRAPHY  09/28/2004   EF 55-60%     Current Outpatient Medications  Medication Sig Dispense Refill   acyclovir (ZOVIRAX) 400 MG tablet Take 400 mg by mouth 3 (three) times daily as needed (For cold sores). .  5   albuterol (VENTOLIN HFA) 108 (90 Base) MCG/ACT inhaler 2 puffs every 6 (six) hours as needed.     allopurinol (ZYLOPRIM) 300 MG tablet Take 300 mg by mouth daily.     apixaban (ELIQUIS) 5 MG TABS tablet Take 1  tablet (5 mg total) by mouth 2 (two) times daily.     furosemide (LASIX) 20 MG tablet Take 1 tablet (20 mg total) by mouth daily as needed (weight gain of 3 pounds in one day or 5 pounds in a week). 90 tablet 1   HYDROcodone-acetaminophen (NORCO/VICODIN) 5-325 MG tablet Take 1 tablet by mouth 3 (three) times daily as needed for moderate pain. 30 tablet 0   isosorbide mononitrate (IMDUR) 60 MG 24 hr tablet Take 1 tablet (60 mg total) by mouth daily. 90 tablet 3   LORazepam (ATIVAN) 0.5 MG tablet Take 1 tablet (0.5 mg total) by mouth 2 (two) times daily as needed for anxiety. 30 tablet 2   metoprolol tartrate (LOPRESSOR) 25 MG tablet Take 25 mg by mouth 2 (two) times daily.  ondansetron (ZOFRAN) 8 MG tablet Take 1 tablet (8 mg total) by mouth every 8 (eight) hours as needed for nausea or vomiting. 30 tablet 3   pantoprazole (PROTONIX) 40 MG tablet Take 40 mg by mouth every evening.      prochlorperazine (COMPAZINE) 10 MG tablet TAKE 1 TABLET BY MOUTH EVERY 6 HOURS AS NEEDED 60 tablet 1   ranolazine (RANEXA) 1000 MG SR tablet Take 1 tablet (1,000 mg total) by mouth 2 (two) times daily. 60 tablet 5   sertraline (ZOLOFT) 50 MG tablet Take 50 mg by mouth daily.     TRELEGY ELLIPTA 100-62.5-25 MCG/ACT AEPB Inhale 1 puff into the lungs daily.     atorvastatin (LIPITOR) 80 MG tablet Take 1 tablet (80 mg total) by mouth daily. 90 tablet 3   nitroGLYCERIN (NITROSTAT) 0.4 MG SL tablet Place 1 tablet (0.4 mg total) under the tongue every 5 (five) minutes as needed for chest pain. (Patient not taking: Reported on 08/09/2022) 90 tablet 3   No current facility-administered medications for this visit.    Allergies:   Latex and Tape    Social History:  The patient  reports that he quit smoking about 33 years ago. His smoking use included cigarettes. He has never used smokeless tobacco. He reports current alcohol use. He reports that he does not use drugs.   Family History:  The patient's family history  includes Breast cancer in his sister and sister; Cancer in his maternal aunt and maternal uncle; Esophageal cancer in his sister; Heart attack in his father; Heart disease in his brother; Lung cancer in his brother; Pancreatic cancer in his sister; Prostate cancer in his brother; Skin cancer in his sister.    ROS:  Please see the history of present illness.   Otherwise, review of systems are positive for none.   All other systems are reviewed and negative.    PHYSICAL EXAM: VS:  BP 120/62 (BP Location: Left Arm, Patient Position: Sitting, Cuff Size: Normal)   Pulse 71   Ht 5\' 7"  (1.702 m)   Wt 225 lb 12.8 oz (102.4 kg)   SpO2 94%   BMI 35.37 kg/m  , BMI Body mass index is 35.37 kg/m.  GEN: Well nourished, well developed, in no acute distress  HEENT: normal  Neck: no JVD, carotid bruits, or masses Cardiac: RRR; no murmurs, rubs, or gallops,no edema. Respiratory:  clear to auscultation bilaterally, normal work of breathing GI: soft, nontender, nondistended, + BS MS: no deformity or atrophy  Skin: warm and dry, no rash Neuro:  Strength and sensation are intact Psych: euthymic mood, full affect   EKG:  EKG is not ordered today.   Recent Labs: 06/05/2022: TSH 1.745 10/20/2022: ALT 12; BUN 21; Creatinine 1.34; Hemoglobin 13.7; Platelet Count 256; Potassium 4.5; Sodium 137 10/27/2022: BNP 149.9    Lipid Panel    Component Value Date/Time   CHOL 120 11/21/2019 0953   TRIG 112 11/21/2019 0953   HDL 44 11/21/2019 0953   CHOLHDL 2.7 11/21/2019 0953   CHOLHDL 3.4 03/30/2019 0212   VLDL 35 03/30/2019 0212   LDLCALC 56 11/21/2019 0953    Dated 01/25/19: cholesterol 117, triglycerides 125, HDL 39, LDL 53. CMET, CBC, TSH normal Dated 04/09/20: cholesterol 123, triglycerides 130, HDL 44, LDL 34. Creatinine 1.17. otherwise CBC, CMET and TSH normal Dated 02/16/21: cholesterol 118, triglycerides 99, HDL 41, LDL 58. Dated 12/02/21: cholesterol 128, triglycerides 195, HDL 39, LDL 57, CBC, CMET  and TSH normal  Wt  Readings from Last 3 Encounters:  11/14/22 225 lb 12.8 oz (102.4 kg)  10/27/22 222 lb 3.2 oz (100.8 kg)  10/20/22 223 lb (101.2 kg)      Other studies Reviewed: Additional studies/ records that were reviewed today include:   Cath 03/26/19: Procedures  LEFT HEART CATH AND CORONARY ANGIOGRAPHY  Conclusion    Prox RCA lesion is 45% stenosed. 2nd RPL lesion is 80% stenosed. Ost LM to Mid LM lesion is 50% stenosed. Ost Cx to Mid Cx lesion is 80% stenosed. Ramus lesion is 90% stenosed. Mid LAD lesion is 70% stenosed. 2nd Diag lesion is 60% stenosed. The left ventricular systolic function is normal. LV end diastolic pressure is normal. The left ventricular ejection fraction is 55-65% by visual estimate.   1. Three vessel obstructive, Calcific CAD 2. Normal LV function 3. Normal LVEDP   Plan: recommend referral to CT surgery for CABG. Will stop Plavix.     Echo 03/30/19: IMPRESSIONS      1. Left ventricular ejection fraction, by visual estimation, is 55 to 60%. The left ventricle has normal function. There is no left ventricular hypertrophy.  2. Global right ventricle has mildly reduced systolic function.The right ventricular size is mildly enlarged. No increase in right ventricular wall thickness.  3. Left atrial size was normal.  4. Right atrial size was mildly dilated.  5. The mitral valve is normal in structure. No evidence of mitral valve regurgitation.  6. The tricuspid valve is normal in structure. Tricuspid valve regurgitation is trivial.  7. The aortic valve is normal in structure. Aortic valve regurgitation is not visualized. No evidence of aortic valve sclerosis or stenosis.  8. The pulmonic valve was grossly normal. Pulmonic valve regurgitation is not visualized.  9. Aortic dilatation noted. 10. There is mild dilatation of the ascending aorta measuring 38 mm. 11. Normal pulmonary artery systolic pressure. 12. The atrial septum is grossly  normal.   Cardiac cath 12/21/20:  LEFT HEART CATH AND CORS/GRAFTS ANGIOGRAPHY   Conclusion      Ost Cx to Mid Cx lesion is 80% stenosed.   Prox RCA lesion is 45% stenosed.   Ramus lesion is 90% stenosed.   2nd RPL lesion is 80% stenosed.   Mid LAD lesion is 65% stenosed.   Ost LM to Mid LM lesion is 40% stenosed.   2nd Diag lesion is 90% stenosed.   LIMA graft was visualized by angiography and is normal in caliber.   SVG graft was visualized by angiography and is normal in caliber.   SVG graft was visualized by angiography and is normal in caliber.   The graft exhibits no disease.   The graft exhibits no disease.   The graft exhibits no disease.   LV end diastolic pressure is normal.   3 vessel obstructive CAD Patent LIMA - this is tied to the second diagonal and not the LAD Patent SVG to the ramus intermediate. No visualization of SVG to diagonal which per op note came off the hood of the SVG to ramus Patent SVG to the PL branch of the RCA Normal LVEDP   Plan: there appears to be good blood flow to all major vessels. The LAD was not revascularized but has borderline disease and the patient had prior Myoview with no ischemia in this territory. Other grafts are patent. Recommend continued medical therapy. Does not appear to be volume overloaded. Echo pending.  Given significant difficulty from the radial approach I would recommend femoral access for any future  Cardiac cath procedures.  Coronary Diagrams  Diagnostic Dominance: Right Intervention  Echo 01/08/21: IMPRESSIONS     1. Left ventricular ejection fraction, by estimation, is 50 to 55%. The  left ventricle has low normal function. The left ventricle demonstrates  regional wall motion abnormalities (see scoring diagram/findings for  description). There is moderate left  ventricular hypertrophy. Left ventricular diastolic parameters are  consistent with Grade I diastolic dysfunction (impaired relaxation). There  is  moderate hypokinesis of the left ventricular, basal-mid septal wall.   2. Right ventricular systolic function is moderately reduced. The right  ventricular size is mildly enlarged. There is normal pulmonary artery  systolic pressure. The estimated right ventricular systolic pressure is  22.5 mmHg.   3. Left atrial size was mildly dilated.   4. The mitral valve is abnormal. Trivial mitral valve regurgitation.   5. The aortic valve is tricuspid. Aortic valve regurgitation is not  visualized. Mild to moderate aortic valve sclerosis/calcification is  present, without any evidence of aortic stenosis.   6. The inferior vena cava is normal in size with greater than 50%  respiratory variability, suggesting right atrial pressure of 3 mmHg.   Comparison(s): Changes from prior study are noted. 09/05/2019: LVEF 60-65%,  severe LAE, moderate RAE.   Echo 06/04/22: IMPRESSIONS     1. Pt in atrial fibrillation at time of study.   2. Left ventricular ejection fraction, by estimation, is 50 to 55%. The  left ventricle has low normal function. The left ventricle has no regional  wall motion abnormalities. The left ventricular internal cavity size was  mildly dilated. There is mild  concentric left ventricular hypertrophy. Left ventricular diastolic  parameters are indeterminate.   3. Right ventricular systolic function is normal. The right ventricular  size is normal. There is normal pulmonary artery systolic pressure.   4. Left atrial size was mildly dilated.   5. Right atrial size was mildly dilated.   6. The mitral valve is grossly normal. Mild mitral valve regurgitation.  No evidence of mitral stenosis.   7. The aortic valve is tricuspid. There is mild calcification of the  aortic valve. Aortic valve regurgitation is not visualized. Aortic valve  sclerosis is present, with no evidence of aortic valve stenosis.   8. Aortic dilatation noted. There is mild dilatation of the aortic root,  measuring 40  mm.   9. The inferior vena cava is normal in size with greater than 50%  respiratory variability, suggesting right atrial pressure of 3 mmHg.   Cardiac cath 06/03/22:  LEFT HEART CATH AND CORS/GRAFTS ANGIOGRAPHY   Conclusion  Multivessel CAD with mild-moderate left main stenosis, moderate LAD stenosis, severe ramus stenosis, severe PLA stenosis S/P CABG with continued graft patency as outlined Normal LVEDP   Med Rx. No culprit for progressive angina - stable anatomy from prior cath studies. Start apixaban tomorrow for new onset afib. Likely DC tomorrow if stable.  Diagnostic Dominance: Right  Intervention  ASSESSMENT AND PLAN:  1. CAD  s/p CABG x 4 on 04/02/19: recent cardiac cath in January showed patent grafts with no clear culprit for acute chest pain symptoms. Continue ASA,  high dose statin and metoprolol. Chest pain improved on Ranexa. No clear cardiac cause for his dyspnea. EDP normal at cath and EF good. Continue current therapy.  Will assess for arrhythmia   Atrial fibrillation post op CABG.  Recurrent on recent hospitalization. Converted to NSR. Now on Eliquis. No evidence of Afib so far but event monitor in  process.   3. HTN: Blood pressure is well controlled.    4. HLD: Continue on Lipitor 80 mg daily. LDL at goal 56      5. History of CVA on ASA and Plavix pre op. Now on Eliquis Only.      6. Cervical/lumbar spine disease. Followed by Dr. Danielle Dess.      7. Chronic diastolic CHF. Volume status looks good on exam. On lasix 20 mg daily prn. Continue sodium restriction. EDP normal in hospital. BNP minimally elevated and improved from prior. CXR ok.     8. Neuroendocrine tumor. Per oncology  9.  SOB- no clear etiology. Extensive evaluation in Jan with cardiac cath, Echo, CT chest without clear cause. Recent CXR with NAD. PFTs in 2022 pretty benign. On Trelegy.   10. Spells with acute sweating, N/V near syncope. May be related to neuroendocrine tumor. Will await results of  event monitor. Nothing further to offer at this time    Current medicines are reviewed at length with the patient today.  The patient does not have concerns regarding medicines.  The following changes have been made:  See above  Labs/ tests ordered today include:   No orders of the defined types were placed in this encounter.    Disposition:   FU 3 months with APP  Signed, Jake Senters [place], MD  11/14/2022 10:47 AM    [facility] [address], [place], [place], [address] Phone [phone], Fax [phone]

## 2022-11-14 ENCOUNTER — Telehealth: Payer: Self-pay

## 2022-11-14 ENCOUNTER — Ambulatory Visit: Payer: Medicare Other | Attending: Cardiology | Admitting: Cardiology

## 2022-11-14 ENCOUNTER — Encounter: Payer: Self-pay | Admitting: Cardiology

## 2022-11-14 VITALS — BP 120/62 | HR 71 | Ht 67.0 in | Wt 225.8 lb

## 2022-11-14 DIAGNOSIS — I2510 Atherosclerotic heart disease of native coronary artery without angina pectoris: Secondary | ICD-10-CM | POA: Diagnosis not present

## 2022-11-14 DIAGNOSIS — I5032 Chronic diastolic (congestive) heart failure: Secondary | ICD-10-CM | POA: Diagnosis not present

## 2022-11-14 DIAGNOSIS — I480 Paroxysmal atrial fibrillation: Secondary | ICD-10-CM | POA: Diagnosis not present

## 2022-11-14 DIAGNOSIS — R55 Syncope and collapse: Secondary | ICD-10-CM | POA: Diagnosis not present

## 2022-11-14 DIAGNOSIS — I10 Essential (primary) hypertension: Secondary | ICD-10-CM

## 2022-11-14 NOTE — Telephone Encounter (Signed)
Call to patient on cell and home to discuss monitor results/  Period of Vtach  6 seconds, Sinus rhythm at 3:54am.

## 2022-11-14 NOTE — Patient Instructions (Signed)
Medication Instructions:  Continue same medications *If you need a refill on your cardiac medications before your next appointment, please call your pharmacy*   Lab Work: None ordered   Testing/Procedures: Continue wearing Heart Monitor   Follow-Up: At [facility], you and your health needs are our priority.  As part of our continuing mission to provide you with exceptional heart care, we have created designated Provider Care Teams.  These Care Teams include your primary Cardiologist (physician) and Advanced Practice Providers (APPs -  Physician Assistants and Nurse Practitioners) who all work together to provide you with the care you need, when you need it.  We recommend signing up for the patient portal called "MyChart".  Sign up information is provided on this After Visit Summary.  MyChart is used to connect with patients for Virtual Visits (Telemedicine).  Patients are able to view lab/test results, encounter notes, upcoming appointments, etc.  Non-urgent messages can be sent to your provider as well.   To learn more about what you can do with MyChart, go to [email or web address].    Your next appointment:  3 months    Provider:  Dr.Jordan's PA

## 2022-11-14 NOTE — Telephone Encounter (Signed)
Patient being seen in office.  Report to Physician to review at appointment

## 2022-11-18 ENCOUNTER — Inpatient Hospital Stay: Payer: Medicare Other | Attending: Nurse Practitioner

## 2022-11-18 VITALS — BP 117/69 | HR 65 | Temp 97.5°F | Resp 18 | Ht 67.0 in | Wt 224.6 lb

## 2022-11-18 DIAGNOSIS — C7A8 Other malignant neuroendocrine tumors: Secondary | ICD-10-CM

## 2022-11-18 DIAGNOSIS — C7A098 Malignant carcinoid tumors of other sites: Secondary | ICD-10-CM | POA: Insufficient documentation

## 2022-11-18 MED ORDER — OCTREOTIDE ACETATE 20 MG IM KIT
20.0000 mg | PACK | Freq: Once | INTRAMUSCULAR | Status: AC
  Administered 2022-11-18: 20 mg via INTRAMUSCULAR
  Filled 2022-11-18: qty 1

## 2022-11-18 NOTE — Patient Instructions (Signed)
Octreotide Injection Solution What is this medication? OCTREOTIDE (ok TREE oh tide) treats high levels of growth hormone (acromegaly). It works by reducing the amount of growth hormone your body makes. This reduces symptoms and the risk of health problems caused by too much growth hormone, such as diabetes and heart disease. It may also be used to treat diarrhea caused by neuroendocrine tumors. It works by slowing down the release of serotonin from the tumor cells. This reduces the number of bowel movements you have. This medicine may be used for other purposes; ask your health care provider or pharmacist if you have questions. COMMON BRAND NAME(S): Bynfezia, Sandostatin What should I tell my care team before I take this medication? They need to know if you have any of these conditions: Diabetes Gallbladder disease Kidney disease Liver disease Thyroid disease An unusual or allergic reaction to octreotide, other medications, foods, dyes, or preservatives Pregnant or trying to get pregnant Breast-feeding How should I use this medication? This medication is injected under the skin or into a vein. It is usually given by your care team in a hospital or clinic setting. If you get this medication at home, you will be taught how to prepare and give it. Use exactly as directed. Take it as directed on the prescription label at the same time every day. Keep taking it unless your care team tells you to stop. Allow the injection solution to come to room temperature before use. Do not warm it artificially. It is important that you put your used needles and syringes in a special sharps container. Do not put them in a trash can. If you do not have a sharps container, call your pharmacist or care team to get one. Talk to your care team about the use of this medication in children. Special care may be needed. Overdosage: If you think you have taken too much of this medicine contact a poison control center or  emergency room at once. NOTE: This medicine is only for you. Do not share this medicine with others. What if I miss a dose? If you miss a dose, take it as soon as you can. If it is almost time for your next dose, take only that dose. Do not take double or extra doses. What may interact with this medication? Bromocriptine Certain medications for blood pressure, heart disease, irregular heartbeat Cyclosporine Diuretics Medications for diabetes, including insulin Quinidine This list may not describe all possible interactions. Give your health care provider a list of all the medicines, herbs, non-prescription drugs, or dietary supplements you use. Also tell them if you smoke, drink alcohol, or use illegal drugs. Some items may interact with your medicine. What should I watch for while using this medication? Visit your care team for regular checks on your progress. Tell your care team if your symptoms do not start to get better or if they get worse. To help reduce irritation at the injection site, use a different site for each injection and make sure the solution is at room temperature before use. This medication may cause decreases in blood sugar. Signs of low blood sugar include chills, cool, pale skin or cold sweats, drowsiness, extreme hunger, fast heartbeat, headache, nausea, nervousness or anxiety, shakiness, trembling, unsteadiness, tiredness, or weakness. Contact your care team right away if you experience any of these symptoms. This medication may increase blood sugar. The risk may be higher in patients who already have diabetes. Ask your care team what you can do to lower your   risk of diabetes while taking this medication. You should make sure you get enough vitamin B12 while you are taking this medication. Discuss the foods you eat and the vitamins you take with your care team. What side effects may I notice from receiving this medication? Side effects that you should report to your care  team as soon as possible: Allergic reactions--skin rash, itching, hives, swelling of the face, lips, tongue, or throat Gallbladder problems--severe stomach pain, nausea, vomiting, fever Heart rhythm changes--fast or irregular heartbeat, dizziness, feeling faint or lightheaded, chest pain, trouble breathing High blood sugar (hyperglycemia)--increased thirst or amount of urine, unusual weakness or fatigue, blurry vision Low blood sugar (hypoglycemia)--tremors or shaking, anxiety, sweating, cold or clammy skin, confusion, dizziness, rapid heartbeat Low thyroid levels (hypothyroidism)--unusual weakness or fatigue, increased sensitivity to cold, constipation, hair loss, dry skin, weight gain, feelings of depression Low vitamin B12 level--pain, tingling, or numbness in the hands or feet, muscle weakness, dizziness, confusion, trouble concentrating Pancreatitis--severe stomach pain that spreads to your back or gets worse after eating or when touched, fever, nausea, vomiting Side effects that usually do not require medical attention (report to your care team if they continue or are bothersome): Diarrhea Dizziness Gas Headache Pain, redness, or irritation at injection site Stomach pain This list may not describe all possible side effects. Call your doctor for medical advice about side effects. You may report side effects to FDA at 1-800-FDA-1088. Where should I keep my medication? Keep out of the reach of children and pets. Store in the refrigerator. Protect from light. Allow to come to room temperature naturally. Do not use artificial heat. If protected from light, the injection may be stored between 20 and 30 degrees C (70 and 86 degrees F) for 14 days. After the initial use, throw away any unused portion of a multiple dose vial after 14 days. Get rid of any unused portions of the ampules after use. To get rid of medications that are no longer needed or have expired: Take the medication to a medication  take-back program. Ask your pharmacy or law enforcement to find a location. If you cannot return the medication, ask your pharmacist or care team how to get rid of the medication safely. NOTE: This sheet is a summary. It may not cover all possible information. If you have questions about this medicine, talk to your doctor, pharmacist, or health care provider.  2024 [employer]vier/Gold Standard (2021-07-29 00:00:00)  

## 2022-11-22 ENCOUNTER — Encounter: Payer: Self-pay | Admitting: Cardiology

## 2022-12-06 ENCOUNTER — Ambulatory Visit: Payer: Medicare Other | Attending: Cardiology

## 2022-12-06 DIAGNOSIS — I480 Paroxysmal atrial fibrillation: Secondary | ICD-10-CM

## 2022-12-06 DIAGNOSIS — I5032 Chronic diastolic (congestive) heart failure: Secondary | ICD-10-CM

## 2022-12-06 DIAGNOSIS — R55 Syncope and collapse: Secondary | ICD-10-CM

## 2022-12-06 DIAGNOSIS — I2510 Atherosclerotic heart disease of native coronary artery without angina pectoris: Secondary | ICD-10-CM

## 2022-12-06 DIAGNOSIS — I10 Essential (primary) hypertension: Secondary | ICD-10-CM

## 2022-12-16 DIAGNOSIS — Z0000 Encounter for general adult medical examination without abnormal findings: Secondary | ICD-10-CM | POA: Diagnosis not present

## 2022-12-16 DIAGNOSIS — I5032 Chronic diastolic (congestive) heart failure: Secondary | ICD-10-CM | POA: Diagnosis not present

## 2022-12-16 DIAGNOSIS — I10 Essential (primary) hypertension: Secondary | ICD-10-CM | POA: Diagnosis not present

## 2022-12-16 DIAGNOSIS — I480 Paroxysmal atrial fibrillation: Secondary | ICD-10-CM | POA: Diagnosis not present

## 2022-12-16 DIAGNOSIS — D6869 Other thrombophilia: Secondary | ICD-10-CM | POA: Diagnosis not present

## 2022-12-16 DIAGNOSIS — I110 Hypertensive heart disease with heart failure: Secondary | ICD-10-CM | POA: Diagnosis not present

## 2022-12-16 DIAGNOSIS — Z79899 Other long term (current) drug therapy: Secondary | ICD-10-CM | POA: Diagnosis not present

## 2022-12-16 DIAGNOSIS — E785 Hyperlipidemia, unspecified: Secondary | ICD-10-CM | POA: Diagnosis not present

## 2022-12-20 ENCOUNTER — Inpatient Hospital Stay: Payer: Medicare Other | Admitting: Nurse Practitioner

## 2022-12-20 ENCOUNTER — Other Ambulatory Visit: Payer: Self-pay | Admitting: Family Medicine

## 2022-12-20 ENCOUNTER — Inpatient Hospital Stay: Payer: Medicare Other | Admitting: Oncology

## 2022-12-20 ENCOUNTER — Inpatient Hospital Stay: Payer: Medicare Other

## 2022-12-20 ENCOUNTER — Encounter: Payer: Self-pay | Admitting: *Deleted

## 2022-12-20 ENCOUNTER — Ambulatory Visit: Payer: Medicare Other

## 2022-12-20 ENCOUNTER — Ambulatory Visit: Payer: Medicare Other | Admitting: Oncology

## 2022-12-20 ENCOUNTER — Inpatient Hospital Stay: Payer: Medicare Other | Attending: Nurse Practitioner

## 2022-12-20 VITALS — BP 127/71 | HR 82 | Temp 98.2°F | Resp 18 | Ht 67.0 in | Wt 224.4 lb

## 2022-12-20 DIAGNOSIS — I4891 Unspecified atrial fibrillation: Secondary | ICD-10-CM | POA: Diagnosis not present

## 2022-12-20 DIAGNOSIS — C7A098 Malignant carcinoid tumors of other sites: Secondary | ICD-10-CM | POA: Diagnosis not present

## 2022-12-20 DIAGNOSIS — C7A8 Other malignant neuroendocrine tumors: Secondary | ICD-10-CM | POA: Diagnosis not present

## 2022-12-20 DIAGNOSIS — E340 Carcinoid syndrome: Secondary | ICD-10-CM | POA: Insufficient documentation

## 2022-12-20 DIAGNOSIS — Z85528 Personal history of other malignant neoplasm of kidney: Secondary | ICD-10-CM | POA: Diagnosis not present

## 2022-12-20 DIAGNOSIS — I712 Thoracic aortic aneurysm, without rupture, unspecified: Secondary | ICD-10-CM

## 2022-12-20 DIAGNOSIS — I2510 Atherosclerotic heart disease of native coronary artery without angina pectoris: Secondary | ICD-10-CM | POA: Insufficient documentation

## 2022-12-20 DIAGNOSIS — Z8673 Personal history of transient ischemic attack (TIA), and cerebral infarction without residual deficits: Secondary | ICD-10-CM | POA: Diagnosis not present

## 2022-12-20 MED ORDER — OCTREOTIDE ACETATE 20 MG IM KIT
20.0000 mg | PACK | Freq: Once | INTRAMUSCULAR | Status: AC
  Administered 2022-12-20: 20 mg via INTRAMUSCULAR
  Filled 2022-12-20: qty 1

## 2022-12-20 NOTE — Progress Notes (Signed)
[facility] OFFICE PROGRESS NOTE   Diagnosis: Pancreas masses  INTERVAL HISTORY:   Jake Dunn returns as scheduled.  He continues monthly Sandostatin.  No diarrhea, flushing, or fever.  Abdominal pain and nausea remain improved.  He continues to have intermittent nausea.  His chief complaint is exertional dyspnea.  He has been evaluated by cardiology and reports he has been referred to pulmonary medicine.  Objective:  Vital signs in last 24 hours:  Blood pressure 127/71, pulse 82, temperature 98.2 F (36.8 C), temperature source Oral, resp. rate 18, height 5\' 7"  (1.702 m), weight 224 lb 6.4 oz (101.8 kg), SpO2 99%.     Resp: Lungs clear bilaterally, no respiratory distress Cardio: Regular rate and rhythm GI: No hepatosplenomegaly, no mass, nontender Vascular: No leg edema   Lab Results:  Lab Results  Component Value Date   WBC 8.3 10/20/2022   HGB 13.7 10/20/2022   HCT 41.2 10/20/2022   MCV 96.7 10/20/2022   PLT 256 10/20/2022   NEUTROABS 5.9 10/20/2022    CMP  Lab Results  Component Value Date   NA 137 10/20/2022   K 4.5 10/20/2022   CL 105 10/20/2022   CO2 25 10/20/2022   GLUCOSE 122 (H) 10/20/2022   BUN 21 10/20/2022   CREATININE 1.34 (H) 10/20/2022   CALCIUM 9.1 10/20/2022   PROT 6.8 10/20/2022   ALBUMIN 4.0 10/20/2022   AST 9 (L) 10/20/2022   ALT 12 10/20/2022   ALKPHOS 76 10/20/2022   BILITOT 0.6 10/20/2022   GFRNONAA 54 (L) 10/20/2022   GFRAA 66 11/21/2019    Lab Results  Component Value Date   UEA540 10 06/16/2022    Medications: I have reviewed the patient's current medications.   Assessment/Plan: Pancreas masses CT chest 06/05/2022-10 mm right hilar node, 9 mm central right upper lobe nodule, 2 contiguous lobulated masses in the central abdomen, left nephrectomy MRI abdomen 06/06/2022-2 adjacent hypervascular masses in the central pancreas with associated ductal dilatation and atrophy, probable prominent lymph node superior  to the right mass, the right-sided mass causes mass effect at the celiac axis and portal vein Upper EUS 06/13/2022-mass identified in the pancreatic body, FNA performed, vascular involvement suspected.  Second similar smaller mass seen immediately adjacent to the larger mass.  Cytology-malignant cells present, solid pseudopapillary neoplasm favored PET scan 07/04/2022-mild increased FDG uptake associated with the dominant mass centered around the pancreatic neck.  Also mild increased uptake associated with the mass within the tail of the pancreas.  Solid-appearing lesion or lymph node within the gastrohepatic ligament which directly abuts the superior margin of the dominant pancreatic mass, also exhibits mild increased FDG uptake.  Mild FDG uptake associated with a right upper lobe lung nodule EUS 07/27/2022-"Twin" masses identified in the pancreas body, no pathology in the left lobe of the liver or gallbladder, no adenopathy, FNA biopsy-"atypical cells present "-could not be further characterize due to lack of cellularity on cell block Dotatate PET 07/28/2022-moderate radiotracer activity associated with pancreatic lesions including a small pancreas tail nodule consistent with well-differentiated versus moderately differentiated neuroendocrine tumor, no metastatic adenopathy or distant metastatic disease Chromogranin A elevated on 08/15/2022 Monthly Sandostatin initiated 08/24/2022 2.   Remote history of renal cell carcinoma, status post a left nephrectomy 03/16/1994 status post left nephrectomy, renal cell carcinoma, pT2, pNX, pMX, tumor characterized by sheets and clusters of neoplastic clear to granular cells, consistent with a renal cell carcinoma grade 2, no definite diagnostic vascular invasion of larger vessels, tumor does  not appear to extend through the renal capsule. 3.   Atrial fibrillation 4.   Coronary artery disease, coronary artery bypass surgery in 2020 5.   History of CVA 6.   Multiple spine  surgeries 7.   Left upper lobe nodule and borderline enlarged right hilar node on chest CT 06/05/2022 8.   Gout 9.   History of adenomatous polyps 10.  Family history of pancreas cancer 11.  Right upper lobe nodule with mild FDG uptake on PET 07/04/2022      Disposition: Jake Dunn has a history of pancreas masses with a clinical presentation consistent with neuroendocrine tumors.  He has been maintained on Sandostatin since April 2024.  He will continue monthly Sandostatin.  There is no clinical evidence of disease progression.  He will return for an office visit, chromogranin A, and restaging CTs in 2 months.  The etiology of the dyspnea is unclear.  I think it is unlikely that dyspnea is related to the neuroendocrine tumor or Sandostatin therapy.  He plans to schedule an appoint with pulmonary medicine.  Jake Papas, MD  12/20/2022  10:20 AM

## 2022-12-20 NOTE — Patient Instructions (Signed)
Octreotide Injection Solution What is this medication? OCTREOTIDE (ok TREE oh tide) treats high levels of growth hormone (acromegaly). It works by reducing the amount of growth hormone your body makes. This reduces symptoms and the risk of health problems caused by too much growth hormone, such as diabetes and heart disease. It may also be used to treat diarrhea caused by neuroendocrine tumors. It works by slowing down the release of serotonin from the tumor cells. This reduces the number of bowel movements you have. This medicine may be used for other purposes; ask your health care provider or pharmacist if you have questions. COMMON BRAND NAME(S): Bynfezia, Sandostatin What should I tell my care team before I take this medication? They need to know if you have any of these conditions: Diabetes Gallbladder disease Kidney disease Liver disease Thyroid disease An unusual or allergic reaction to octreotide, other medications, foods, dyes, or preservatives Pregnant or trying to get pregnant Breast-feeding How should I use this medication? This medication is injected under the skin or into a vein. It is usually given by your care team in a hospital or clinic setting. If you get this medication at home, you will be taught how to prepare and give it. Use exactly as directed. Take it as directed on the prescription label at the same time every day. Keep taking it unless your care team tells you to stop. Allow the injection solution to come to room temperature before use. Do not warm it artificially. It is important that you put your used needles and syringes in a special sharps container. Do not put them in a trash can. If you do not have a sharps container, call your pharmacist or care team to get one. Talk to your care team about the use of this medication in children. Special care may be needed. Overdosage: If you think you have taken too much of this medicine contact a poison control center or  emergency room at once. NOTE: This medicine is only for you. Do not share this medicine with others. What if I miss a dose? If you miss a dose, take it as soon as you can. If it is almost time for your next dose, take only that dose. Do not take double or extra doses. What may interact with this medication? Bromocriptine Certain medications for blood pressure, heart disease, irregular heartbeat Cyclosporine Diuretics Medications for diabetes, including insulin Quinidine This list may not describe all possible interactions. Give your health care provider a list of all the medicines, herbs, non-prescription drugs, or dietary supplements you use. Also tell them if you smoke, drink alcohol, or use illegal drugs. Some items may interact with your medicine. What should I watch for while using this medication? Visit your care team for regular checks on your progress. Tell your care team if your symptoms do not start to get better or if they get worse. To help reduce irritation at the injection site, use a different site for each injection and make sure the solution is at room temperature before use. This medication may cause decreases in blood sugar. Signs of low blood sugar include chills, cool, pale skin or cold sweats, drowsiness, extreme hunger, fast heartbeat, headache, nausea, nervousness or anxiety, shakiness, trembling, unsteadiness, tiredness, or weakness. Contact your care team right away if you experience any of these symptoms. This medication may increase blood sugar. The risk may be higher in patients who already have diabetes. Ask your care team what you can do to lower your   risk of diabetes while taking this medication. You should make sure you get enough vitamin B12 while you are taking this medication. Discuss the foods you eat and the vitamins you take with your care team. What side effects may I notice from receiving this medication? Side effects that you should report to your care  team as soon as possible: Allergic reactions--skin rash, itching, hives, swelling of the face, lips, tongue, or throat Gallbladder problems--severe stomach pain, nausea, vomiting, fever Heart rhythm changes--fast or irregular heartbeat, dizziness, feeling faint or lightheaded, chest pain, trouble breathing High blood sugar (hyperglycemia)--increased thirst or amount of urine, unusual weakness or fatigue, blurry vision Low blood sugar (hypoglycemia)--tremors or shaking, anxiety, sweating, cold or clammy skin, confusion, dizziness, rapid heartbeat Low thyroid levels (hypothyroidism)--unusual weakness or fatigue, increased sensitivity to cold, constipation, hair loss, dry skin, weight gain, feelings of depression Low vitamin B12 level--pain, tingling, or numbness in the hands or feet, muscle weakness, dizziness, confusion, trouble concentrating Pancreatitis--severe stomach pain that spreads to your back or gets worse after eating or when touched, fever, nausea, vomiting Side effects that usually do not require medical attention (report to your care team if they continue or are bothersome): Diarrhea Dizziness Gas Headache Pain, redness, or irritation at injection site Stomach pain This list may not describe all possible side effects. Call your doctor for medical advice about side effects. You may report side effects to FDA at 1-800-FDA-1088. Where should I keep my medication? Keep out of the reach of children and pets. Store in the refrigerator. Protect from light. Allow to come to room temperature naturally. Do not use artificial heat. If protected from light, the injection may be stored between 20 and 30 degrees C (70 and 86 degrees F) for 14 days. After the initial use, throw away any unused portion of a multiple dose vial after 14 days. Get rid of any unused portions of the ampules after use. To get rid of medications that are no longer needed or have expired: Take the medication to a medication  take-back program. Ask your pharmacy or law enforcement to find a location. If you cannot return the medication, ask your pharmacist or care team how to get rid of the medication safely. NOTE: This sheet is a summary. It may not cover all possible information. If you have questions about this medicine, talk to your doctor, pharmacist, or health care provider.  2024 [employer]vier/Gold Standard (2021-07-29 00:00:00)  

## 2022-12-21 ENCOUNTER — Encounter: Payer: Self-pay | Admitting: Oncology

## 2022-12-26 ENCOUNTER — Ambulatory Visit
Admission: RE | Admit: 2022-12-26 | Discharge: 2022-12-26 | Disposition: A | Discharge status: Home / Self Care | Payer: Medicare Other | Source: Ambulatory Visit | Attending: Oncology | Admitting: Oncology

## 2022-12-26 ENCOUNTER — Ambulatory Visit: Admission: RE | Admit: 2022-12-26 | Payer: Medicare Other | Source: Ambulatory Visit

## 2022-12-26 DIAGNOSIS — C7A8 Other malignant neuroendocrine tumors: Secondary | ICD-10-CM | POA: Diagnosis not present

## 2022-12-26 DIAGNOSIS — I700 Atherosclerosis of aorta: Secondary | ICD-10-CM | POA: Diagnosis not present

## 2022-12-26 DIAGNOSIS — J439 Emphysema, unspecified: Secondary | ICD-10-CM | POA: Diagnosis not present

## 2022-12-26 MED ORDER — IOPAMIDOL (ISOVUE-300) INJECTION 61%
100.0000 mL | Freq: Once | INTRAVENOUS | Status: AC | PRN
  Administered 2022-12-26: 80 mL via INTRAVENOUS

## 2022-12-30 ENCOUNTER — Other Ambulatory Visit: Payer: Self-pay | Admitting: Oncology

## 2022-12-30 DIAGNOSIS — K8689 Other specified diseases of pancreas: Secondary | ICD-10-CM

## 2023-01-16 ENCOUNTER — Other Ambulatory Visit: Payer: Self-pay

## 2023-01-16 MED ORDER — APIXABAN 5 MG PO TABS: 5.0000 mg | ORAL_TABLET | Freq: Two times a day (BID) | ORAL | 3 refills | Status: DC

## 2023-01-17 ENCOUNTER — Inpatient Hospital Stay: Payer: Medicare Other | Attending: Nurse Practitioner

## 2023-01-17 ENCOUNTER — Telehealth: Payer: Self-pay | Admitting: *Deleted

## 2023-01-17 VITALS — BP 112/72 | HR 66 | Temp 97.9°F | Resp 17 | Ht 67.0 in | Wt 228.4 lb

## 2023-01-17 DIAGNOSIS — C7A098 Malignant carcinoid tumors of other sites: Secondary | ICD-10-CM | POA: Diagnosis not present

## 2023-01-17 DIAGNOSIS — E340 Carcinoid syndrome: Secondary | ICD-10-CM | POA: Insufficient documentation

## 2023-01-17 DIAGNOSIS — C7A8 Other malignant neuroendocrine tumors: Secondary | ICD-10-CM

## 2023-01-17 MED ORDER — OCTREOTIDE ACETATE 20 MG IM KIT
20.0000 mg | PACK | Freq: Once | INTRAMUSCULAR | Status: AC
  Administered 2023-01-17: 20 mg via INTRAMUSCULAR
  Filled 2023-01-17: qty 1

## 2023-01-17 NOTE — Patient Instructions (Signed)
Octreotide Injection Solution What is this medication? OCTREOTIDE (ok TREE oh tide) treats high levels of growth hormone (acromegaly). It works by reducing the amount of growth hormone your body makes. This reduces symptoms and the risk of health problems caused by too much growth hormone, such as diabetes and heart disease. It may also be used to treat diarrhea caused by neuroendocrine tumors. It works by slowing down the release of serotonin from the tumor cells. This reduces the number of bowel movements you have. This medicine may be used for other purposes; ask your health care provider or pharmacist if you have questions. COMMON BRAND NAME(S): Bynfezia, Sandostatin What should I tell my care team before I take this medication? They need to know if you have any of these conditions: Diabetes Gallbladder disease Kidney disease Liver disease Thyroid disease An unusual or allergic reaction to octreotide, other medications, foods, dyes, or preservatives Pregnant or trying to get pregnant Breast-feeding How should I use this medication? This medication is injected under the skin or into a vein. It is usually given by your care team in a hospital or clinic setting. If you get this medication at home, you will be taught how to prepare and give it. Use exactly as directed. Take it as directed on the prescription label at the same time every day. Keep taking it unless your care team tells you to stop. Allow the injection solution to come to room temperature before use. Do not warm it artificially. It is important that you put your used needles and syringes in a special sharps container. Do not put them in a trash can. If you do not have a sharps container, call your pharmacist or care team to get one. Talk to your care team about the use of this medication in children. Special care may be needed. Overdosage: If you think you have taken too much of this medicine contact a poison control center or  emergency room at once. NOTE: This medicine is only for you. Do not share this medicine with others. What if I miss a dose? If you miss a dose, take it as soon as you can. If it is almost time for your next dose, take only that dose. Do not take double or extra doses. What may interact with this medication? Bromocriptine Certain medications for blood pressure, heart disease, irregular heartbeat Cyclosporine Diuretics Medications for diabetes, including insulin Quinidine This list may not describe all possible interactions. Give your health care provider a list of all the medicines, herbs, non-prescription drugs, or dietary supplements you use. Also tell them if you smoke, drink alcohol, or use illegal drugs. Some items may interact with your medicine. What should I watch for while using this medication? Visit your care team for regular checks on your progress. Tell your care team if your symptoms do not start to get better or if they get worse. To help reduce irritation at the injection site, use a different site for each injection and make sure the solution is at room temperature before use. This medication may cause decreases in blood sugar. Signs of low blood sugar include chills, cool, pale skin or cold sweats, drowsiness, extreme hunger, fast heartbeat, headache, nausea, nervousness or anxiety, shakiness, trembling, unsteadiness, tiredness, or weakness. Contact your care team right away if you experience any of these symptoms. This medication may increase blood sugar. The risk may be higher in patients who already have diabetes. Ask your care team what you can do to lower your   risk of diabetes while taking this medication. You should make sure you get enough vitamin B12 while you are taking this medication. Discuss the foods you eat and the vitamins you take with your care team. What side effects may I notice from receiving this medication? Side effects that you should report to your care  team as soon as possible: Allergic reactions--skin rash, itching, hives, swelling of the face, lips, tongue, or throat Gallbladder problems--severe stomach pain, nausea, vomiting, fever Heart rhythm changes--fast or irregular heartbeat, dizziness, feeling faint or lightheaded, chest pain, trouble breathing High blood sugar (hyperglycemia)--increased thirst or amount of urine, unusual weakness or fatigue, blurry vision Low blood sugar (hypoglycemia)--tremors or shaking, anxiety, sweating, cold or clammy skin, confusion, dizziness, rapid heartbeat Low thyroid levels (hypothyroidism)--unusual weakness or fatigue, increased sensitivity to cold, constipation, hair loss, dry skin, weight gain, feelings of depression Low vitamin B12 level--pain, tingling, or numbness in the hands or feet, muscle weakness, dizziness, confusion, trouble concentrating Pancreatitis--severe stomach pain that spreads to your back or gets worse after eating or when touched, fever, nausea, vomiting Side effects that usually do not require medical attention (report to your care team if they continue or are bothersome): Diarrhea Dizziness Gas Headache Pain, redness, or irritation at injection site Stomach pain This list may not describe all possible side effects. Call your doctor for medical advice about side effects. You may report side effects to FDA at 1-800-FDA-1088. Where should I keep my medication? Keep out of the reach of children and pets. Store in the refrigerator. Protect from light. Allow to come to room temperature naturally. Do not use artificial heat. If protected from light, the injection may be stored between 20 and 30 degrees C (70 and 86 degrees F) for 14 days. After the initial use, throw away any unused portion of a multiple dose vial after 14 days. Get rid of any unused portions of the ampules after use. To get rid of medications that are no longer needed or have expired: Take the medication to a medication  take-back program. Ask your pharmacy or law enforcement to find a location. If you cannot return the medication, ask your pharmacist or care team how to get rid of the medication safely. NOTE: This sheet is a summary. It may not cover all possible information. If you have questions about this medicine, talk to your doctor, pharmacist, or health care provider.  2024 [employer]vier/Gold Standard (2021-07-29 00:00:00)  

## 2023-01-17 NOTE — Telephone Encounter (Signed)
Patient expressed to injection nurse today that he "feels bad" and asking if the injection is causing this? Also wants results of his CT scan to see if he needs to continue the injections. Called and LVM with his daughter requesting return call to hear what specific issues he is having.

## 2023-01-18 ENCOUNTER — Encounter: Payer: Self-pay | Admitting: Oncology

## 2023-01-18 NOTE — Telephone Encounter (Signed)
LVM that Dr. Truett Perna said his CT scan is stable and he does not feel the Sandostatin is causing him to feel poorly. Will discuss at next visit on 10/17.

## 2023-01-19 ENCOUNTER — Telehealth: Payer: Self-pay | Admitting: *Deleted

## 2023-01-19 NOTE — Telephone Encounter (Signed)
Mrs. Basista called to ask for CT scan results. Informed her that MD said CT is stable-no change in pancreas mass or lung nodule and this was provided on Mr. Houser voice mail yesterday. Wife says he has nausea daily--tends to worsen after his injection. The zofran and compazine do keep it under control. MD will discuss with them further in October.

## 2023-01-27 ENCOUNTER — Telehealth: Payer: Self-pay

## 2023-01-27 NOTE — Telephone Encounter (Signed)
Called patient left message on personal voice mail we received a letter from [facility] patient assistance for Eliquis denied.

## 2023-02-08 ENCOUNTER — Encounter: Payer: Self-pay | Admitting: Nurse Practitioner

## 2023-02-08 ENCOUNTER — Ambulatory Visit: Payer: Medicare Other | Attending: Nurse Practitioner | Admitting: Nurse Practitioner

## 2023-02-08 VITALS — BP 118/78 | HR 70 | Ht 67.0 in | Wt 221.6 lb

## 2023-02-08 DIAGNOSIS — R55 Syncope and collapse: Secondary | ICD-10-CM

## 2023-02-08 DIAGNOSIS — I10 Essential (primary) hypertension: Secondary | ICD-10-CM

## 2023-02-08 DIAGNOSIS — I480 Paroxysmal atrial fibrillation: Secondary | ICD-10-CM

## 2023-02-08 DIAGNOSIS — I5032 Chronic diastolic (congestive) heart failure: Secondary | ICD-10-CM | POA: Diagnosis not present

## 2023-02-08 DIAGNOSIS — R0602 Shortness of breath: Secondary | ICD-10-CM

## 2023-02-08 DIAGNOSIS — I2510 Atherosclerotic heart disease of native coronary artery without angina pectoris: Secondary | ICD-10-CM | POA: Diagnosis not present

## 2023-02-08 DIAGNOSIS — Z8673 Personal history of transient ischemic attack (TIA), and cerebral infarction without residual deficits: Secondary | ICD-10-CM

## 2023-02-08 DIAGNOSIS — E785 Hyperlipidemia, unspecified: Secondary | ICD-10-CM

## 2023-02-08 DIAGNOSIS — C7A8 Other malignant neuroendocrine tumors: Secondary | ICD-10-CM

## 2023-02-08 NOTE — Progress Notes (Signed)
by Olga Millers MD Signature Date/Time: 06/04/2022/2:38:17 PM    Final   TEE  ECHO INTRAOPERATIVE TEE 04/02/2019  Narrative *INTRAOPERATIVE TRANSESOPHAGEAL REPORT *    Patient Name:   Jake Dunn Date of Exam: 04/02/2019 Medical Rec #:  [id number]      Height:       67.0 in Accession #:    [id number]     Weight:       226.2 lb Date of Birth:  February 20, 1941      BSA:          2.13 m Patient Age:    82 years       BP:           129/91 mmHg Patient Gender: M              HR:           68 bpm. Exam Location:  Anesthesiology  Transesophogeal exam was perform intraoperatively during surgical procedure. Patient was closely monitored under general anesthesia during the  entirety of examination.  Indications:     CAD Sonographer:     Irving Burton Senior RDCS Performing Phys: Karna Christmas MD Diagnosing Phys: Karna Christmas MD  Complications: No known complications during this procedure. POST-OP IMPRESSIONS - Left Ventricle: The left ventricle is unchanged from pre-bypass. - Aorta: The aorta appears unchanged from pre-bypass. - Left Atrial Appendage: The left atrial appendage appears unchanged from pre-bypass. - Aortic Valve: The aortic valve appears unchanged from pre-bypass. - Mitral Valve: There is trivial regurgitation. - Tricuspid Valve: The tricuspid valve appears unchanged from pre-bypass. - Interatrial Septum: The interatrial septum appears unchanged from pre-bypass. - Pericardium: The pericardium appears unchanged from pre-bypass.  PRE-OP FINDINGS Left Ventricle: The left ventricle has normal systolic function, with an ejection fraction of 60-65%. The cavity size was normal. There is mildly increased left ventricular wall thickness.  Right Ventricle: The right ventricle has normal systolic function. The cavity was normal. There is no increase in right ventricular wall thickness.  Left Atrium: Left atrial size was mildly dilated.  Right Atrium: Right atrial size was normal in size.  Interatrial Septum: No atrial level shunt detected by color flow Doppler.  Pericardium: There is no evidence of pericardial effusion.  Mitral Valve: The mitral valve is normal in structure. Mitral valve regurgitation is not visualized by color flow Doppler.  Tricuspid Valve: The tricuspid valve was normal in structure. Tricuspid valve regurgitation was not visualized by color flow Doppler.  Aortic Valve: The aortic valve is normal in structure. There is mild thickening of the aortic valve and there is mild calcification of the aortic valve. Aortic valve regurgitation was not visualized by color flow Doppler.  Pulmonic Valve: The pulmonic valve was normal in  structure. Pulmonic valve regurgitation is not visualized by color flow Doppler.   Aorta: The aortic root, ascending aorta and aortic arch are normal in size and structure. There is evidence of plaque in the descending aorta.  +--------------+-------++ LEFT VENTRICLE        +--------------+-------++ PLAX 2D               +--------------+-------++ LVIDd:        3.63 cm +--------------+-------++ LVIDs:        2.36 cm +--------------+-------++ LV PW:        1.38 cm +--------------+-------++ LV IVS:       1.25 cm +--------------+-------++ LV SV:        36 ml   +--------------+-------++ LV SV Index:  by Olga Millers MD Signature Date/Time: 06/04/2022/2:38:17 PM    Final   TEE  ECHO INTRAOPERATIVE TEE 04/02/2019  Narrative *INTRAOPERATIVE TRANSESOPHAGEAL REPORT *    Patient Name:   Jake Dunn Date of Exam: 04/02/2019 Medical Rec #:  [id number]      Height:       67.0 in Accession #:    [id number]     Weight:       226.2 lb Date of Birth:  February 20, 1941      BSA:          2.13 m Patient Age:    82 years       BP:           129/91 mmHg Patient Gender: M              HR:           68 bpm. Exam Location:  Anesthesiology  Transesophogeal exam was perform intraoperatively during surgical procedure. Patient was closely monitored under general anesthesia during the  entirety of examination.  Indications:     CAD Sonographer:     Irving Burton Senior RDCS Performing Phys: Karna Christmas MD Diagnosing Phys: Karna Christmas MD  Complications: No known complications during this procedure. POST-OP IMPRESSIONS - Left Ventricle: The left ventricle is unchanged from pre-bypass. - Aorta: The aorta appears unchanged from pre-bypass. - Left Atrial Appendage: The left atrial appendage appears unchanged from pre-bypass. - Aortic Valve: The aortic valve appears unchanged from pre-bypass. - Mitral Valve: There is trivial regurgitation. - Tricuspid Valve: The tricuspid valve appears unchanged from pre-bypass. - Interatrial Septum: The interatrial septum appears unchanged from pre-bypass. - Pericardium: The pericardium appears unchanged from pre-bypass.  PRE-OP FINDINGS Left Ventricle: The left ventricle has normal systolic function, with an ejection fraction of 60-65%. The cavity size was normal. There is mildly increased left ventricular wall thickness.  Right Ventricle: The right ventricle has normal systolic function. The cavity was normal. There is no increase in right ventricular wall thickness.  Left Atrium: Left atrial size was mildly dilated.  Right Atrium: Right atrial size was normal in size.  Interatrial Septum: No atrial level shunt detected by color flow Doppler.  Pericardium: There is no evidence of pericardial effusion.  Mitral Valve: The mitral valve is normal in structure. Mitral valve regurgitation is not visualized by color flow Doppler.  Tricuspid Valve: The tricuspid valve was normal in structure. Tricuspid valve regurgitation was not visualized by color flow Doppler.  Aortic Valve: The aortic valve is normal in structure. There is mild thickening of the aortic valve and there is mild calcification of the aortic valve. Aortic valve regurgitation was not visualized by color flow Doppler.  Pulmonic Valve: The pulmonic valve was normal in  structure. Pulmonic valve regurgitation is not visualized by color flow Doppler.   Aorta: The aortic root, ascending aorta and aortic arch are normal in size and structure. There is evidence of plaque in the descending aorta.  +--------------+-------++ LEFT VENTRICLE        +--------------+-------++ PLAX 2D               +--------------+-------++ LVIDd:        3.63 cm +--------------+-------++ LVIDs:        2.36 cm +--------------+-------++ LV PW:        1.38 cm +--------------+-------++ LV IVS:       1.25 cm +--------------+-------++ LV SV:        36 ml   +--------------+-------++ LV SV Index:  by Olga Millers MD Signature Date/Time: 06/04/2022/2:38:17 PM    Final   TEE  ECHO INTRAOPERATIVE TEE 04/02/2019  Narrative *INTRAOPERATIVE TRANSESOPHAGEAL REPORT *    Patient Name:   Jake Dunn Date of Exam: 04/02/2019 Medical Rec #:  [id number]      Height:       67.0 in Accession #:    [id number]     Weight:       226.2 lb Date of Birth:  February 20, 1941      BSA:          2.13 m Patient Age:    82 years       BP:           129/91 mmHg Patient Gender: M              HR:           68 bpm. Exam Location:  Anesthesiology  Transesophogeal exam was perform intraoperatively during surgical procedure. Patient was closely monitored under general anesthesia during the  entirety of examination.  Indications:     CAD Sonographer:     Irving Burton Senior RDCS Performing Phys: Karna Christmas MD Diagnosing Phys: Karna Christmas MD  Complications: No known complications during this procedure. POST-OP IMPRESSIONS - Left Ventricle: The left ventricle is unchanged from pre-bypass. - Aorta: The aorta appears unchanged from pre-bypass. - Left Atrial Appendage: The left atrial appendage appears unchanged from pre-bypass. - Aortic Valve: The aortic valve appears unchanged from pre-bypass. - Mitral Valve: There is trivial regurgitation. - Tricuspid Valve: The tricuspid valve appears unchanged from pre-bypass. - Interatrial Septum: The interatrial septum appears unchanged from pre-bypass. - Pericardium: The pericardium appears unchanged from pre-bypass.  PRE-OP FINDINGS Left Ventricle: The left ventricle has normal systolic function, with an ejection fraction of 60-65%. The cavity size was normal. There is mildly increased left ventricular wall thickness.  Right Ventricle: The right ventricle has normal systolic function. The cavity was normal. There is no increase in right ventricular wall thickness.  Left Atrium: Left atrial size was mildly dilated.  Right Atrium: Right atrial size was normal in size.  Interatrial Septum: No atrial level shunt detected by color flow Doppler.  Pericardium: There is no evidence of pericardial effusion.  Mitral Valve: The mitral valve is normal in structure. Mitral valve regurgitation is not visualized by color flow Doppler.  Tricuspid Valve: The tricuspid valve was normal in structure. Tricuspid valve regurgitation was not visualized by color flow Doppler.  Aortic Valve: The aortic valve is normal in structure. There is mild thickening of the aortic valve and there is mild calcification of the aortic valve. Aortic valve regurgitation was not visualized by color flow Doppler.  Pulmonic Valve: The pulmonic valve was normal in  structure. Pulmonic valve regurgitation is not visualized by color flow Doppler.   Aorta: The aortic root, ascending aorta and aortic arch are normal in size and structure. There is evidence of plaque in the descending aorta.  +--------------+-------++ LEFT VENTRICLE        +--------------+-------++ PLAX 2D               +--------------+-------++ LVIDd:        3.63 cm +--------------+-------++ LVIDs:        2.36 cm +--------------+-------++ LV PW:        1.38 cm +--------------+-------++ LV IVS:       1.25 cm +--------------+-------++ LV SV:        36 ml   +--------------+-------++ LV SV Index:  The graft exhibits no disease.  Saphenous Graft To Ramus SVG graft was visualized by angiography and is normal in caliber.  The graft exhibits no disease.  Saphenous Graft To 2nd RPL SVG graft was visualized by angiography and is normal in caliber.  The graft exhibits no disease.  Intervention  No interventions have been documented.   STRESS TESTS  MYOCARDIAL PERFUSION IMAGING 05/29/2020  Narrative  The left ventricular ejection fraction is mildly decreased (45-54%).  Nuclear stress EF: 48% with septal wall hypokinesis secondary to prior CABG  There was no ST segment deviation noted during stress.  This is a low risk study based despite mildly reduced ejection fraction calculation of 48%. There is no evidence of ischemia or infarction on stress images.  Donato Schultz, MD   ECHOCARDIOGRAM  ECHOCARDIOGRAM COMPLETE 06/04/2022  Narrative ECHOCARDIOGRAM REPORT    Patient Name:   Jake Dunn Date of Exam:  06/04/2022 Medical Rec #:  [id number]      Height:       67.0 in Accession #:    [id number]     Weight:       227.1 lb Date of Birth:  [birth year]      BSA:          2.134 m Patient Age:    81 years       BP:           107/64 mmHg Patient Gender: M              HR:           65 bpm. Exam Location:  Inpatient  Procedure: 2D Echo, Cardiac Doppler, Color Doppler and Intracardiac Opacification Agent  Indications:    Chest Pain R07.9  History:        Patient has prior history of Echocardiogram examinations, most recent 03/30/2019. Stroke; Risk Factors:Hypertension and Dyslipidemia.  Sonographer:    Leta Jungling RDCS Referring Phys: [phone] MICHAEL COOPER  IMPRESSIONS   1. Pt in atrial fibrillation at time of study. 2. Left ventricular ejection fraction, by estimation, is 50 to 55%. The left ventricle has low normal function. The left ventricle has no regional wall motion abnormalities. The left ventricular internal cavity size was mildly dilated. There is mild concentric left ventricular hypertrophy. Left ventricular diastolic parameters are indeterminate. 3. Right ventricular systolic function is normal. The right ventricular size is normal. There is normal pulmonary artery systolic pressure. 4. Left atrial size was mildly dilated. 5. Right atrial size was mildly dilated. 6. The mitral valve is grossly normal. Mild mitral valve regurgitation. No evidence of mitral stenosis. 7. The aortic valve is tricuspid. There is mild calcification of the aortic valve. Aortic valve regurgitation is not visualized. Aortic valve sclerosis is present, with no evidence of aortic valve stenosis. 8. Aortic dilatation noted. There is mild dilatation of the aortic root, measuring 40 mm. 9. The inferior vena cava is normal in size with greater than 50% respiratory variability, suggesting right atrial pressure of 3 mmHg.  FINDINGS Left Ventricle: Left ventricular ejection fraction, by estimation, is 50 to 55%.  The left ventricle has low normal function. The left ventricle has no regional wall motion abnormalities. Definity contrast agent was given IV to delineate the left ventricular endocardial borders. The left ventricular internal cavity size was mildly dilated. There is mild concentric left ventricular hypertrophy. Left ventricular diastolic function could not be evaluated due to atrial fibrillation. Left ventricular diastolic parameters are indeterminate.  Right Ventricle: The right ventricular  by Olga Millers MD Signature Date/Time: 06/04/2022/2:38:17 PM    Final   TEE  ECHO INTRAOPERATIVE TEE 04/02/2019  Narrative *INTRAOPERATIVE TRANSESOPHAGEAL REPORT *    Patient Name:   Jake Dunn Date of Exam: 04/02/2019 Medical Rec #:  [id number]      Height:       67.0 in Accession #:    [id number]     Weight:       226.2 lb Date of Birth:  February 20, 1941      BSA:          2.13 m Patient Age:    82 years       BP:           129/91 mmHg Patient Gender: M              HR:           68 bpm. Exam Location:  Anesthesiology  Transesophogeal exam was perform intraoperatively during surgical procedure. Patient was closely monitored under general anesthesia during the  entirety of examination.  Indications:     CAD Sonographer:     Irving Burton Senior RDCS Performing Phys: Karna Christmas MD Diagnosing Phys: Karna Christmas MD  Complications: No known complications during this procedure. POST-OP IMPRESSIONS - Left Ventricle: The left ventricle is unchanged from pre-bypass. - Aorta: The aorta appears unchanged from pre-bypass. - Left Atrial Appendage: The left atrial appendage appears unchanged from pre-bypass. - Aortic Valve: The aortic valve appears unchanged from pre-bypass. - Mitral Valve: There is trivial regurgitation. - Tricuspid Valve: The tricuspid valve appears unchanged from pre-bypass. - Interatrial Septum: The interatrial septum appears unchanged from pre-bypass. - Pericardium: The pericardium appears unchanged from pre-bypass.  PRE-OP FINDINGS Left Ventricle: The left ventricle has normal systolic function, with an ejection fraction of 60-65%. The cavity size was normal. There is mildly increased left ventricular wall thickness.  Right Ventricle: The right ventricle has normal systolic function. The cavity was normal. There is no increase in right ventricular wall thickness.  Left Atrium: Left atrial size was mildly dilated.  Right Atrium: Right atrial size was normal in size.  Interatrial Septum: No atrial level shunt detected by color flow Doppler.  Pericardium: There is no evidence of pericardial effusion.  Mitral Valve: The mitral valve is normal in structure. Mitral valve regurgitation is not visualized by color flow Doppler.  Tricuspid Valve: The tricuspid valve was normal in structure. Tricuspid valve regurgitation was not visualized by color flow Doppler.  Aortic Valve: The aortic valve is normal in structure. There is mild thickening of the aortic valve and there is mild calcification of the aortic valve. Aortic valve regurgitation was not visualized by color flow Doppler.  Pulmonic Valve: The pulmonic valve was normal in  structure. Pulmonic valve regurgitation is not visualized by color flow Doppler.   Aorta: The aortic root, ascending aorta and aortic arch are normal in size and structure. There is evidence of plaque in the descending aorta.  +--------------+-------++ LEFT VENTRICLE        +--------------+-------++ PLAX 2D               +--------------+-------++ LVIDd:        3.63 cm +--------------+-------++ LVIDs:        2.36 cm +--------------+-------++ LV PW:        1.38 cm +--------------+-------++ LV IVS:       1.25 cm +--------------+-------++ LV SV:        36 ml   +--------------+-------++ LV SV Index:  The graft exhibits no disease.  Saphenous Graft To Ramus SVG graft was visualized by angiography and is normal in caliber.  The graft exhibits no disease.  Saphenous Graft To 2nd RPL SVG graft was visualized by angiography and is normal in caliber.  The graft exhibits no disease.  Intervention  No interventions have been documented.   STRESS TESTS  MYOCARDIAL PERFUSION IMAGING 05/29/2020  Narrative  The left ventricular ejection fraction is mildly decreased (45-54%).  Nuclear stress EF: 48% with septal wall hypokinesis secondary to prior CABG  There was no ST segment deviation noted during stress.  This is a low risk study based despite mildly reduced ejection fraction calculation of 48%. There is no evidence of ischemia or infarction on stress images.  Donato Schultz, MD   ECHOCARDIOGRAM  ECHOCARDIOGRAM COMPLETE 06/04/2022  Narrative ECHOCARDIOGRAM REPORT    Patient Name:   Jake Dunn Date of Exam:  06/04/2022 Medical Rec #:  [id number]      Height:       67.0 in Accession #:    [id number]     Weight:       227.1 lb Date of Birth:  [birth year]      BSA:          2.134 m Patient Age:    81 years       BP:           107/64 mmHg Patient Gender: M              HR:           65 bpm. Exam Location:  Inpatient  Procedure: 2D Echo, Cardiac Doppler, Color Doppler and Intracardiac Opacification Agent  Indications:    Chest Pain R07.9  History:        Patient has prior history of Echocardiogram examinations, most recent 03/30/2019. Stroke; Risk Factors:Hypertension and Dyslipidemia.  Sonographer:    Leta Jungling RDCS Referring Phys: [phone] MICHAEL COOPER  IMPRESSIONS   1. Pt in atrial fibrillation at time of study. 2. Left ventricular ejection fraction, by estimation, is 50 to 55%. The left ventricle has low normal function. The left ventricle has no regional wall motion abnormalities. The left ventricular internal cavity size was mildly dilated. There is mild concentric left ventricular hypertrophy. Left ventricular diastolic parameters are indeterminate. 3. Right ventricular systolic function is normal. The right ventricular size is normal. There is normal pulmonary artery systolic pressure. 4. Left atrial size was mildly dilated. 5. Right atrial size was mildly dilated. 6. The mitral valve is grossly normal. Mild mitral valve regurgitation. No evidence of mitral stenosis. 7. The aortic valve is tricuspid. There is mild calcification of the aortic valve. Aortic valve regurgitation is not visualized. Aortic valve sclerosis is present, with no evidence of aortic valve stenosis. 8. Aortic dilatation noted. There is mild dilatation of the aortic root, measuring 40 mm. 9. The inferior vena cava is normal in size with greater than 50% respiratory variability, suggesting right atrial pressure of 3 mmHg.  FINDINGS Left Ventricle: Left ventricular ejection fraction, by estimation, is 50 to 55%.  The left ventricle has low normal function. The left ventricle has no regional wall motion abnormalities. Definity contrast agent was given IV to delineate the left ventricular endocardial borders. The left ventricular internal cavity size was mildly dilated. There is mild concentric left ventricular hypertrophy. Left ventricular diastolic function could not be evaluated due to atrial fibrillation. Left ventricular diastolic parameters are indeterminate.  Right Ventricle: The right ventricular  The graft exhibits no disease.  Saphenous Graft To Ramus SVG graft was visualized by angiography and is normal in caliber.  The graft exhibits no disease.  Saphenous Graft To 2nd RPL SVG graft was visualized by angiography and is normal in caliber.  The graft exhibits no disease.  Intervention  No interventions have been documented.   STRESS TESTS  MYOCARDIAL PERFUSION IMAGING 05/29/2020  Narrative  The left ventricular ejection fraction is mildly decreased (45-54%).  Nuclear stress EF: 48% with septal wall hypokinesis secondary to prior CABG  There was no ST segment deviation noted during stress.  This is a low risk study based despite mildly reduced ejection fraction calculation of 48%. There is no evidence of ischemia or infarction on stress images.  Donato Schultz, MD   ECHOCARDIOGRAM  ECHOCARDIOGRAM COMPLETE 06/04/2022  Narrative ECHOCARDIOGRAM REPORT    Patient Name:   Jake Dunn Date of Exam:  06/04/2022 Medical Rec #:  [id number]      Height:       67.0 in Accession #:    [id number]     Weight:       227.1 lb Date of Birth:  [birth year]      BSA:          2.134 m Patient Age:    81 years       BP:           107/64 mmHg Patient Gender: M              HR:           65 bpm. Exam Location:  Inpatient  Procedure: 2D Echo, Cardiac Doppler, Color Doppler and Intracardiac Opacification Agent  Indications:    Chest Pain R07.9  History:        Patient has prior history of Echocardiogram examinations, most recent 03/30/2019. Stroke; Risk Factors:Hypertension and Dyslipidemia.  Sonographer:    Leta Jungling RDCS Referring Phys: [phone] MICHAEL COOPER  IMPRESSIONS   1. Pt in atrial fibrillation at time of study. 2. Left ventricular ejection fraction, by estimation, is 50 to 55%. The left ventricle has low normal function. The left ventricle has no regional wall motion abnormalities. The left ventricular internal cavity size was mildly dilated. There is mild concentric left ventricular hypertrophy. Left ventricular diastolic parameters are indeterminate. 3. Right ventricular systolic function is normal. The right ventricular size is normal. There is normal pulmonary artery systolic pressure. 4. Left atrial size was mildly dilated. 5. Right atrial size was mildly dilated. 6. The mitral valve is grossly normal. Mild mitral valve regurgitation. No evidence of mitral stenosis. 7. The aortic valve is tricuspid. There is mild calcification of the aortic valve. Aortic valve regurgitation is not visualized. Aortic valve sclerosis is present, with no evidence of aortic valve stenosis. 8. Aortic dilatation noted. There is mild dilatation of the aortic root, measuring 40 mm. 9. The inferior vena cava is normal in size with greater than 50% respiratory variability, suggesting right atrial pressure of 3 mmHg.  FINDINGS Left Ventricle: Left ventricular ejection fraction, by estimation, is 50 to 55%.  The left ventricle has low normal function. The left ventricle has no regional wall motion abnormalities. Definity contrast agent was given IV to delineate the left ventricular endocardial borders. The left ventricular internal cavity size was mildly dilated. There is mild concentric left ventricular hypertrophy. Left ventricular diastolic function could not be evaluated due to atrial fibrillation. Left ventricular diastolic parameters are indeterminate.  Right Ventricle: The right ventricular  The graft exhibits no disease.  Saphenous Graft To Ramus SVG graft was visualized by angiography and is normal in caliber.  The graft exhibits no disease.  Saphenous Graft To 2nd RPL SVG graft was visualized by angiography and is normal in caliber.  The graft exhibits no disease.  Intervention  No interventions have been documented.   STRESS TESTS  MYOCARDIAL PERFUSION IMAGING 05/29/2020  Narrative  The left ventricular ejection fraction is mildly decreased (45-54%).  Nuclear stress EF: 48% with septal wall hypokinesis secondary to prior CABG  There was no ST segment deviation noted during stress.  This is a low risk study based despite mildly reduced ejection fraction calculation of 48%. There is no evidence of ischemia or infarction on stress images.  Donato Schultz, MD   ECHOCARDIOGRAM  ECHOCARDIOGRAM COMPLETE 06/04/2022  Narrative ECHOCARDIOGRAM REPORT    Patient Name:   Jake Dunn Date of Exam:  06/04/2022 Medical Rec #:  [id number]      Height:       67.0 in Accession #:    [id number]     Weight:       227.1 lb Date of Birth:  [birth year]      BSA:          2.134 m Patient Age:    81 years       BP:           107/64 mmHg Patient Gender: M              HR:           65 bpm. Exam Location:  Inpatient  Procedure: 2D Echo, Cardiac Doppler, Color Doppler and Intracardiac Opacification Agent  Indications:    Chest Pain R07.9  History:        Patient has prior history of Echocardiogram examinations, most recent 03/30/2019. Stroke; Risk Factors:Hypertension and Dyslipidemia.  Sonographer:    Leta Jungling RDCS Referring Phys: [phone] MICHAEL COOPER  IMPRESSIONS   1. Pt in atrial fibrillation at time of study. 2. Left ventricular ejection fraction, by estimation, is 50 to 55%. The left ventricle has low normal function. The left ventricle has no regional wall motion abnormalities. The left ventricular internal cavity size was mildly dilated. There is mild concentric left ventricular hypertrophy. Left ventricular diastolic parameters are indeterminate. 3. Right ventricular systolic function is normal. The right ventricular size is normal. There is normal pulmonary artery systolic pressure. 4. Left atrial size was mildly dilated. 5. Right atrial size was mildly dilated. 6. The mitral valve is grossly normal. Mild mitral valve regurgitation. No evidence of mitral stenosis. 7. The aortic valve is tricuspid. There is mild calcification of the aortic valve. Aortic valve regurgitation is not visualized. Aortic valve sclerosis is present, with no evidence of aortic valve stenosis. 8. Aortic dilatation noted. There is mild dilatation of the aortic root, measuring 40 mm. 9. The inferior vena cava is normal in size with greater than 50% respiratory variability, suggesting right atrial pressure of 3 mmHg.  FINDINGS Left Ventricle: Left ventricular ejection fraction, by estimation, is 50 to 55%.  The left ventricle has low normal function. The left ventricle has no regional wall motion abnormalities. Definity contrast agent was given IV to delineate the left ventricular endocardial borders. The left ventricular internal cavity size was mildly dilated. There is mild concentric left ventricular hypertrophy. Left ventricular diastolic function could not be evaluated due to atrial fibrillation. Left ventricular diastolic parameters are indeterminate.  Right Ventricle: The right ventricular  The graft exhibits no disease.  Saphenous Graft To Ramus SVG graft was visualized by angiography and is normal in caliber.  The graft exhibits no disease.  Saphenous Graft To 2nd RPL SVG graft was visualized by angiography and is normal in caliber.  The graft exhibits no disease.  Intervention  No interventions have been documented.   STRESS TESTS  MYOCARDIAL PERFUSION IMAGING 05/29/2020  Narrative  The left ventricular ejection fraction is mildly decreased (45-54%).  Nuclear stress EF: 48% with septal wall hypokinesis secondary to prior CABG  There was no ST segment deviation noted during stress.  This is a low risk study based despite mildly reduced ejection fraction calculation of 48%. There is no evidence of ischemia or infarction on stress images.  Donato Schultz, MD   ECHOCARDIOGRAM  ECHOCARDIOGRAM COMPLETE 06/04/2022  Narrative ECHOCARDIOGRAM REPORT    Patient Name:   Jake Dunn Date of Exam:  06/04/2022 Medical Rec #:  [id number]      Height:       67.0 in Accession #:    [id number]     Weight:       227.1 lb Date of Birth:  [birth year]      BSA:          2.134 m Patient Age:    81 years       BP:           107/64 mmHg Patient Gender: M              HR:           65 bpm. Exam Location:  Inpatient  Procedure: 2D Echo, Cardiac Doppler, Color Doppler and Intracardiac Opacification Agent  Indications:    Chest Pain R07.9  History:        Patient has prior history of Echocardiogram examinations, most recent 03/30/2019. Stroke; Risk Factors:Hypertension and Dyslipidemia.  Sonographer:    Leta Jungling RDCS Referring Phys: [phone] MICHAEL COOPER  IMPRESSIONS   1. Pt in atrial fibrillation at time of study. 2. Left ventricular ejection fraction, by estimation, is 50 to 55%. The left ventricle has low normal function. The left ventricle has no regional wall motion abnormalities. The left ventricular internal cavity size was mildly dilated. There is mild concentric left ventricular hypertrophy. Left ventricular diastolic parameters are indeterminate. 3. Right ventricular systolic function is normal. The right ventricular size is normal. There is normal pulmonary artery systolic pressure. 4. Left atrial size was mildly dilated. 5. Right atrial size was mildly dilated. 6. The mitral valve is grossly normal. Mild mitral valve regurgitation. No evidence of mitral stenosis. 7. The aortic valve is tricuspid. There is mild calcification of the aortic valve. Aortic valve regurgitation is not visualized. Aortic valve sclerosis is present, with no evidence of aortic valve stenosis. 8. Aortic dilatation noted. There is mild dilatation of the aortic root, measuring 40 mm. 9. The inferior vena cava is normal in size with greater than 50% respiratory variability, suggesting right atrial pressure of 3 mmHg.  FINDINGS Left Ventricle: Left ventricular ejection fraction, by estimation, is 50 to 55%.  The left ventricle has low normal function. The left ventricle has no regional wall motion abnormalities. Definity contrast agent was given IV to delineate the left ventricular endocardial borders. The left ventricular internal cavity size was mildly dilated. There is mild concentric left ventricular hypertrophy. Left ventricular diastolic function could not be evaluated due to atrial fibrillation. Left ventricular diastolic parameters are indeterminate.  Right Ventricle: The right ventricular  The graft exhibits no disease.  Saphenous Graft To Ramus SVG graft was visualized by angiography and is normal in caliber.  The graft exhibits no disease.  Saphenous Graft To 2nd RPL SVG graft was visualized by angiography and is normal in caliber.  The graft exhibits no disease.  Intervention  No interventions have been documented.   STRESS TESTS  MYOCARDIAL PERFUSION IMAGING 05/29/2020  Narrative  The left ventricular ejection fraction is mildly decreased (45-54%).  Nuclear stress EF: 48% with septal wall hypokinesis secondary to prior CABG  There was no ST segment deviation noted during stress.  This is a low risk study based despite mildly reduced ejection fraction calculation of 48%. There is no evidence of ischemia or infarction on stress images.  Donato Schultz, MD   ECHOCARDIOGRAM  ECHOCARDIOGRAM COMPLETE 06/04/2022  Narrative ECHOCARDIOGRAM REPORT    Patient Name:   Jake Dunn Date of Exam:  06/04/2022 Medical Rec #:  [id number]      Height:       67.0 in Accession #:    [id number]     Weight:       227.1 lb Date of Birth:  [birth year]      BSA:          2.134 m Patient Age:    81 years       BP:           107/64 mmHg Patient Gender: M              HR:           65 bpm. Exam Location:  Inpatient  Procedure: 2D Echo, Cardiac Doppler, Color Doppler and Intracardiac Opacification Agent  Indications:    Chest Pain R07.9  History:        Patient has prior history of Echocardiogram examinations, most recent 03/30/2019. Stroke; Risk Factors:Hypertension and Dyslipidemia.  Sonographer:    Leta Jungling RDCS Referring Phys: [phone] MICHAEL COOPER  IMPRESSIONS   1. Pt in atrial fibrillation at time of study. 2. Left ventricular ejection fraction, by estimation, is 50 to 55%. The left ventricle has low normal function. The left ventricle has no regional wall motion abnormalities. The left ventricular internal cavity size was mildly dilated. There is mild concentric left ventricular hypertrophy. Left ventricular diastolic parameters are indeterminate. 3. Right ventricular systolic function is normal. The right ventricular size is normal. There is normal pulmonary artery systolic pressure. 4. Left atrial size was mildly dilated. 5. Right atrial size was mildly dilated. 6. The mitral valve is grossly normal. Mild mitral valve regurgitation. No evidence of mitral stenosis. 7. The aortic valve is tricuspid. There is mild calcification of the aortic valve. Aortic valve regurgitation is not visualized. Aortic valve sclerosis is present, with no evidence of aortic valve stenosis. 8. Aortic dilatation noted. There is mild dilatation of the aortic root, measuring 40 mm. 9. The inferior vena cava is normal in size with greater than 50% respiratory variability, suggesting right atrial pressure of 3 mmHg.  FINDINGS Left Ventricle: Left ventricular ejection fraction, by estimation, is 50 to 55%.  The left ventricle has low normal function. The left ventricle has no regional wall motion abnormalities. Definity contrast agent was given IV to delineate the left ventricular endocardial borders. The left ventricular internal cavity size was mildly dilated. There is mild concentric left ventricular hypertrophy. Left ventricular diastolic function could not be evaluated due to atrial fibrillation. Left ventricular diastolic parameters are indeterminate.  Right Ventricle: The right ventricular

## 2023-02-08 NOTE — Patient Instructions (Signed)
Medication Instructions:  Your physician recommends that you continue on your current medications as directed. Please refer to the Current Medication list given to you today.  *If you need a refill on your cardiac medications before your next appointment, please call your pharmacy*   Lab Work: NONE ordered at this time of appointment     Testing/Procedures: NONE ordered at this time of appointment     Follow-Up: At [facility], you and your health needs are our priority.  As part of our continuing mission to provide you with exceptional heart care, we have created designated Provider Care Teams.  These Care Teams include your primary Cardiologist (physician) and Advanced Practice Providers (APPs -  Physician Assistants and Nurse Practitioners) who all work together to provide you with the care you need, when you need it.  We recommend signing up for the patient portal called "MyChart".  Sign up information is provided on this After Visit Summary.  MyChart is used to connect with patients for Virtual Visits (Telemedicine).  Patients are able to view lab/test results, encounter notes, upcoming appointments, etc.  Non-urgent messages can be sent to your provider as well.   To learn more about what you can do with MyChart, go to [email or web address].    Your next appointment:   6-8 week(s)  Provider:   Bernadene Person, NP

## 2023-02-10 ENCOUNTER — Other Ambulatory Visit: Payer: Self-pay | Admitting: Cardiology

## 2023-02-10 NOTE — Telephone Encounter (Signed)
Prescription refill request for Eliquis received. Indication:afib Last office visit:10/24 Scr:1.34  6/24 Age: 82 Weight:100.5  kg  Prescription refilled

## 2023-02-13 ENCOUNTER — Ambulatory Visit: Payer: Medicare Other | Admitting: Pulmonary Disease

## 2023-02-13 ENCOUNTER — Encounter: Payer: Self-pay | Admitting: Pulmonary Disease

## 2023-02-13 VITALS — BP 116/64 | HR 66 | Temp 97.2°F | Ht 67.0 in | Wt 227.4 lb

## 2023-02-13 DIAGNOSIS — I5032 Chronic diastolic (congestive) heart failure: Secondary | ICD-10-CM | POA: Diagnosis not present

## 2023-02-13 DIAGNOSIS — M48061 Spinal stenosis, lumbar region without neurogenic claudication: Secondary | ICD-10-CM

## 2023-02-13 DIAGNOSIS — C7A8 Other malignant neuroendocrine tumors: Secondary | ICD-10-CM

## 2023-02-13 DIAGNOSIS — M5416 Radiculopathy, lumbar region: Secondary | ICD-10-CM

## 2023-02-13 NOTE — Progress Notes (Signed)
Jake Dunn    [id number]    [birth year]  Primary Care Physician:White, Aram Beecham, MD  Referring Physician: Laurann Montana, MD [phone] [address] [place],  [place] [address]  Chief complaint:   Patient being seen for shortness of breath  HPI:  Was last here about 2 years ago Had had a PFT at the time showing no significant obstructive disease, no significant bronchodilator response, no restriction, normal diffusing capacity  Diagnosed with neuroendocrine cancer with pancreatic masses for which she has been on Sandostatin  Follow-up radiological data have shown stable lesions, appears to be benefiting from treatment Past history of renal cell cancer for which she had a left nephrectomy  History of heart failure with diastolic dysfunction, coronary artery disease, paroxysmal atrial fibrillation  Some deconditioning likely playing a role with his shortness of breath he continues to use Trelegy regularly  He does have a history of chronic musculoskeletal pains, 5 back surgeries, both knees replaced   Outpatient Encounter Medications as of 02/13/2023  Medication Sig   acyclovir (ZOVIRAX) 400 MG tablet Take 400 mg by mouth 3 (three) times daily as needed (For cold sores). Marland Kitchen   albuterol (VENTOLIN HFA) 108 (90 Base) MCG/ACT inhaler 2 puffs every 6 (six) hours as needed.   allopurinol (ZYLOPRIM) 300 MG tablet Take 300 mg by mouth daily.   apixaban (ELIQUIS) 5 MG TABS tablet TAKE 1 TABLET BY MOUTH TWICE A DAY   furosemide (LASIX) 20 MG tablet Take 1 tablet (20 mg total) by mouth daily as needed (weight gain of 3 pounds in one day or 5 pounds in a week).   HYDROcodone-acetaminophen (NORCO/VICODIN) 5-325 MG tablet Take 1 tablet by mouth 3 (three) times daily as needed for moderate pain.   LORazepam (ATIVAN) 0.5 MG tablet Take 1 tablet (0.5 mg total) by mouth 2 (two) times daily as needed for anxiety.   metoprolol tartrate (LOPRESSOR) 25 MG tablet Take 25 mg by mouth 2  (two) times daily.   octreotide (SANDOSTATIN LAR) 20 MG injection Inject 20 mg into the muscle every 28 (twenty-eight) days.   ondansetron (ZOFRAN) 8 MG tablet TAKE 1 TABLET BY MOUTH EVERY 8 HOURS AS NEEDED FOR NAUSEA AND/OR VOMITING   pantoprazole (PROTONIX) 40 MG tablet Take 40 mg by mouth every evening.    prochlorperazine (COMPAZINE) 10 MG tablet TAKE 1 TABLET BY MOUTH EVERY 6 HOURS AS NEEDED   ranolazine (RANEXA) 1000 MG SR tablet Take 1 tablet (1,000 mg total) by mouth 2 (two) times daily.   sertraline (ZOLOFT) 50 MG tablet Take 50 mg by mouth daily.   TRELEGY ELLIPTA 100-62.5-25 MCG/ACT AEPB Inhale 1 puff into the lungs daily.   atorvastatin (LIPITOR) 80 MG tablet Take 1 tablet (80 mg total) by mouth daily.   isosorbide mononitrate (IMDUR) 60 MG 24 hr tablet Take 1 tablet (60 mg total) by mouth daily.   nitroGLYCERIN (NITROSTAT) 0.4 MG SL tablet Place 1 tablet (0.4 mg total) under the tongue every 5 (five) minutes as needed for chest pain. (Patient not taking: Reported on 08/09/2022)   No facility-administered encounter medications on file as of 02/13/2023.    Allergies as of 02/13/2023 - Review Complete 02/13/2023  Allergen Reaction Noted   Latex Rash 05/25/2011   Tape Rash 05/25/2011    Past Medical History:  Diagnosis Date   Adenomatous polyp    Anxiety    Arthritis    "knees, ankles, shoulders" (08/15/2016)   CAD (coronary  artery disease)    s/p cath in 2014 showing significant stenosis in the diagonal side branches and in the RV marginal branch. These vessels were small and diffusely diseased. He is managed medically. 08/16/16 Cath, no disease progression   Chronic lower back pain    Disc disease, degenerative, cervical    GERD (gastroesophageal reflux disease)    Gout    Hyperlipidemia    Hypertension    Osteoarthritis    Renal cell carcinoma    left kidney removal   Stroke [facility])    MINI STROKE 15+ YRS AGO, no residual   Stroke (HCC)    "I've had 2 or 3"; denies  residual on 08/15/2016   Syncope and collapse     Past Surgical History:  Procedure Laterality Date   ANTERIOR CERVICAL DECOMP/DISCECTOMY FUSION  02/2004; 04/2005   Hattie Perch 09/20/2010; Hattie Perch 09/20/2010   APPLICATION OF ROBOTIC ASSISTANCE FOR SPINAL PROCEDURE N/A 01/02/2018   Procedure: APPLICATION OF ROBOTIC ASSISTANCE FOR SPINAL PROCEDURE;  Surgeon: Barnett Abu, MD;  Location: [facility] OR;  Service: Neurosurgery;  Laterality: N/A;   BACK SURGERY     CARDIAC CATHETERIZATION  12/14/2008   ef 50-55%. SHOWED SIGNIFICANT STENOSIS AND TO DIAGONAL SIDE BRANCHES AND IN THE RIGHT VENTRICULAR MARGINAL BRANCH. THESE VESSELS WERE SMALL AND DIFFUSELY DISEASED   CARDIOVASCULAR STRESS TEST  12/10/2009   EF 61%. EKG negative. Mild peri ischemia. Managed medically.    CARPAL TUNNEL RELEASE Right 11/2005   Hattie Perch 09/20/2010   CARPAL TUNNEL RELEASE Left 06/2007   Hattie Perch 5/132012   COLONOSCOPY  11/2004   Hattie Perch 09/20/2010   COLONOSCOPY W/ BIOPSIES AND POLYPECTOMY  09/2002   Hattie Perch 09/20/2010   CORONARY ARTERY BYPASS GRAFT N/A 04/02/2019   Procedure: CORONARY ARTERY BYPASS GRAFTING (CABG) using LIMA to LAD; Endoscopically harvested right greater saphenous vein to the PLB, Ramus, and Diag 2.;  Surgeon: Corliss Skains, MD;  Location: [facility] OR;  Service: Open Heart Surgery;  Laterality: N/A;   ESOPHAGOGASTRODUODENOSCOPY (EGD) WITH PROPOFOL N/A 06/13/2022   Procedure: ESOPHAGOGASTRODUODENOSCOPY (EGD) WITH PROPOFOL;  Surgeon: Willis Modena, MD;  Location: [facility];  Service: Gastroenterology;  Laterality: N/A;   ESOPHAGOGASTRODUODENOSCOPY (EGD) WITH PROPOFOL Bilateral 07/27/2022   Procedure: ESOPHAGOGASTRODUODENOSCOPY (EGD) WITH PROPOFOL;  Surgeon: Willis Modena, MD;  Location: [facility];  Service: Gastroenterology;  Laterality: Bilateral;   EUS Bilateral 07/27/2022   Procedure: UPPER ENDOSCOPIC ULTRASOUND (EUS) LINEAR;  Surgeon: Willis Modena, MD;  Location: [facility];  Service: Gastroenterology;  Laterality:  Bilateral;   FINE NEEDLE ASPIRATION N/A 06/13/2022   Procedure: FINE NEEDLE ASPIRATION (FNA) LINEAR;  Surgeon: Willis Modena, MD;  Location: [facility];  Service: Gastroenterology;  Laterality: N/A;   FINE NEEDLE ASPIRATION Bilateral 07/27/2022   Procedure: FINE NEEDLE ASPIRATION (FNA) LINEAR;  Surgeon: Willis Modena, MD;  Location: [facility];  Service: Gastroenterology;  Laterality: Bilateral;   INCISION AND DRAINAGE ABSCESS Right 10/18/2016   Procedure: INCISION AND DRAINAGE ABSCESS RIGHT SHOULDER;  Surgeon: Sheral Apley, MD;  Location: [facility];  Service: Orthopedics;  Laterality: Right;   INCISION AND DRAINAGE OF WOUND Right 08/2005   shoulder/notes 09/20/2010   IR FLUORO GUIDE CV LINE LEFT  10/19/2016   IR US GUIDE VASC ACCESS LEFT  10/19/2016   IRRIGATION AND DEBRIDEMENT SHOULDER Right 12/22/2016   Procedure: IRRIGATION AND DEBRIDEMENT RIGHT SHOULDER;  Surgeon: Loreta Ave, MD;  Location: [facility] OR;  Service: Orthopedics;  Laterality: Right;   JOINT REPLACEMENT     LEFT HEART CATH AND CORONARY ANGIOGRAPHY  N/A 08/16/2016   Procedure: Left Heart Cath and Coronary Angiography;  Surgeon: Tonny Bollman, MD;  Location: [facility] INVASIVE CV LAB;  Service: Cardiovascular;  Laterality: N/A;   LEFT HEART CATH AND CORONARY ANGIOGRAPHY N/A 03/26/2019   Procedure: LEFT HEART CATH AND CORONARY ANGIOGRAPHY;  Surgeon: Swaziland, Peter M, MD;  Location: [facility] INVASIVE CV LAB;  Service: Cardiovascular;  Laterality: N/A;   LEFT HEART CATH AND CORS/GRAFTS ANGIOGRAPHY N/A 12/21/2020   Procedure: LEFT HEART CATH AND CORS/GRAFTS ANGIOGRAPHY;  Surgeon: Swaziland, Peter M, MD;  Location: [facility] INVASIVE CV LAB;  Service: Cardiovascular;  Laterality: N/A;   LEFT HEART CATH AND CORS/GRAFTS ANGIOGRAPHY N/A 06/03/2022   Procedure: LEFT HEART CATH AND CORS/GRAFTS ANGIOGRAPHY;  Surgeon: Tonny Bollman, MD;  Location: [facility] INVASIVE CV LAB;  Service: Cardiovascular;  Laterality: N/A;   LEFT HEART CATHETERIZATION WITH  CORONARY ANGIOGRAM N/A 08/09/2012   Procedure: LEFT HEART CATHETERIZATION WITH CORONARY ANGIOGRAM;  Surgeon: Peter M Swaziland, MD;  Location: [facility] CATH LAB;  Service: Cardiovascular;  Laterality: N/A;   LUMBAR FUSION     NEPHRECTOMY Left early 2000s   REPLACEMENT TOTAL KNEE Right 08/2008   Hattie Perch 09/07/2010   SHOULDER ARTHROSCOPY WITH ROTATOR CUFF REPAIR Right 06/2005   Hattie Perch 09/20/2010   TOTAL KNEE ARTHROPLASTY  06/08/2011   Procedure: TOTAL KNEE ARTHROPLASTY;  Surgeon: Loreta Ave, MD;  Location: [facility] OR;  Service: Orthopedics;  Laterality: Left;   TOTAL SHOULDER ARTHROPLASTY Right 04/06/2016   Procedure: RIGHT REVERSE TOTAL SHOULDER ARTHROPLASTY;  Surgeon: Loreta Ave, MD;  Location: [facility] OR;  Service: Orthopedics;  Laterality: Right;   TOTAL SHOULDER REVISION Right 12/22/2016   Procedure: RIGHT SHOULDER GLENOID AND HUMERAL COMPONENT REVISION;  Surgeon: Loreta Ave, MD;  Location: [facility] OR;  Service: Orthopedics;  Laterality: Right;   UPPER ESOPHAGEAL ENDOSCOPIC ULTRASOUND (EUS) Bilateral 06/13/2022   Procedure: UPPER ESOPHAGEAL ENDOSCOPIC ULTRASOUND (EUS);  Surgeon: Willis Modena, MD;  Location: [facility];  Service: Gastroenterology;  Laterality: Bilateral;   US ECHOCARDIOGRAPHY  12/15/2008   EF 60-65%   US ECHOCARDIOGRAPHY  09/28/2004   EF 55-60%    Family History  Problem Relation Age of Onset   Heart attack Father    Breast cancer Sister    Breast cancer Sister    Pancreatic cancer Sister    Esophageal cancer Sister    Skin cancer Sister    Heart disease Brother    Lung cancer Brother    Prostate cancer Brother        d. 74s   Cancer Maternal Aunt        unk type   Cancer Maternal Uncle        unk type   Allergic rhinitis Neg Hx    Angioedema Neg Hx    Asthma Neg Hx    Atopy Neg Hx    Eczema Neg Hx    Immunodeficiency Neg Hx    Urticaria Neg Hx     Social History   Socioeconomic History   Marital status: Married    Spouse name: Not on file   Number of children:  Not on file   Years of education: Not on file   Highest education level: Not on file  Occupational History   Not on file  Tobacco Use   Smoking status: Former    Current packs/day: 0.00    Types: Cigarettes    Quit date: 05/31/1989    Years since quitting: 33.7   Smokeless tobacco: Never   Tobacco comments:  08/15/2016 "hadn't smoked a carton of cigarettes all my life"  Vaping Use   Vaping status: Never Used  Substance and Sexual Activity   Alcohol use: Yes    Alcohol/week: 0.0 standard drinks of alcohol    Comment: "sometimes daily, sometimes weekly" beer   Drug use: No   Sexual activity: Not Currently  Other Topics Concern   Not on file  Social History Narrative   Not on file   Social Determinants of Health   Financial Resource Strain: Not on file  Food Insecurity: No Food Insecurity (06/16/2022)   Hunger Vital Sign    Worried About Running Out of Food in the Last Year: Never true    Ran Out of Food in the Last Year: Never true  Transportation Needs: No Transportation Needs (06/16/2022)   PRAPARE - Administrator, Civil Service (Medical): No    Lack of Transportation (Non-Medical): No  Physical Activity: Not on file  Stress: Not on file  Social Connections: Not on file  Intimate Partner Violence: Not At Risk (06/16/2022)   Humiliation, Afraid, Rape, and Kick questionnaire    Fear of Current or Ex-Partner: No    Emotionally Abused: No    Physically Abused: No    Sexually Abused: No    Review of Systems  Respiratory:  Positive for shortness of breath.     Vitals:   02/13/23 1459  BP: 116/64  Pulse: 66  Temp: (!) 97.2 F (36.2 C)  SpO2: 96%     Physical Exam Constitutional:      Appearance: He is obese.  HENT:     Head: Normocephalic.     Mouth/Throat:     Mouth: Mucous membranes are moist.  Eyes:     General: No scleral icterus. Cardiovascular:     Rate and Rhythm: Normal rate and regular rhythm.     Heart sounds: No murmur heard.    No  friction rub.  Pulmonary:     Effort: No respiratory distress.     Breath sounds: No stridor. No wheezing or rhonchi.  Musculoskeletal:     Cervical back: No rigidity or tenderness.  Neurological:     Mental Status: He is alert.  Psychiatric:        Mood and Affect: Mood normal.    Data Reviewed: Patient's PFT from 2022 was reviewed with him  His echocardiogram was also reviewed with him during visit today  Assessment:  Multifactorial shortness of breath  Less than 1% risk of shortness of breath from Sandostatin reported on up-to-date, has not been having any signs of congestion or URI symptoms  History of coronary artery disease History of diastolic heart failure History of atrial fibrillation  History of obstructive lung disease for which he is compliant with use of Trelegy  Chronic musculoskeletal pain and discomfort  Plan/Recommendations: Graded activities as tolerated  We did discuss possibility of repeating his pulmonary function test to compare with the one from 2022, this was deferred at present  Increasing Trelegy from 100 to Trelegy 200 also was discussed  He will focus on exercising on a regular basis  Encouraged to continue Sandostatin as this has at least helped lesions stay stable  I will see him back in about 6 months  Encouraged to call with significant concerns   Virl Diamond MD [facility] 02/13/2023, 3:33 PM  CC: Laurann Montana, MD

## 2023-02-13 NOTE — Patient Instructions (Signed)
I will see you back in about 6 months  Graded exercises as tolerated -Be committed to exercising regularly -This will make a difference to how your breathing is overall  Continue using your Trelegy, albuterol as needed  We can always consider increasing the Trelegy from Trelegy 100-200 if needed  A breathing study can be scheduled in the near future  Call us with any significant concerns

## 2023-02-20 ENCOUNTER — Telehealth: Payer: Self-pay | Admitting: *Deleted

## 2023-02-20 NOTE — Telephone Encounter (Signed)
Called patient to evaluate symptoms since last office visit on 02/08/23. He reports he is able to push mow his lawn for 20-30 minutes on most occasions. He continues to have fatigue but feels that his symptoms are stable. He does not have any additional concerns at this time. He is able to achieve > 4 METS activity without concerning cardiac symptoms. He may proceed with colonoscopy without further cardiac testing.  Will forward to Pharm D for recommendation regarding Eliquis.

## 2023-02-20 NOTE — Telephone Encounter (Signed)
Pre-operative Risk Assessment    Patient Name: Jake Dunn  DOB: Sep 06, 1940 MRN: [id number]      Request for Surgical Clearance    Procedure:   Colonoscopy  Date of Surgery:  Clearance 03/21/23                                 Surgeon:  Dr. Tor Netters Surgeon's Group or Practice Name:  [facility] Phone number:  [phone] Fax number:  [phone]   Type of Clearance Requested:   - Medical  - Pharmacy:  Hold Apixaban (Eliquis) Not Indicated.   Type of Anesthesia:   Propofol   Additional requests/questions:  Patient last office visit with Bernadene Person, NP on October 2.  Signed, Emmit Pomfret   02/20/2023, 9:44 AM

## 2023-02-20 NOTE — Telephone Encounter (Signed)
   Primary Cardiologist: [name], MD  Chart reviewed as part of pre-operative protocol coverage. Given past medical history and time since last visit, based on ACC/AHA guidelines, Jake Dunn would be at acceptable risk for the planned procedure without further cardiovascular testing.   Patient was advised that if he develops new symptoms prior to surgery to contact our office to arrange a follow-up appointment.  He verbalized understanding.  Per office protocol, patient can hold Eliquis for 1-2 days prior to procedure. (He is planning to hold 2 days).  I will route this recommendation to the requesting party via Epic fax function and remove from pre-op pool.  Please call with questions.  Levi Aland, NP-C  02/20/2023, 4:40 PM [address] Office [phone] Fax [phone]

## 2023-02-20 NOTE — Telephone Encounter (Signed)
Had recent visit with you on October 2 and is now seeking clearance for colonoscopy.  Could you please comment on agreement to proceed with procedure, or should he await follow-up appointment in December.  Please send response to p cv div preop.  Thank you, Marcelino Duster

## 2023-02-20 NOTE — Telephone Encounter (Signed)
Patient with diagnosis of PAF on Eliquis for anticoagulation.    Procedure: Colonoscopy  Date of procedure: 03/21/2023   CHA2DS2-VASc Score = 7   This indicates a 11.2% annual risk of stroke. The patient's score is based upon: CHF History: 1 HTN History: 1 Diabetes History: 0 Stroke History: 2 Vascular Disease History: 1 Age Score: 2 Gender Score: 0     CrCl 64 mL/min  Platelet count 256 K    Per office protocol, patient can hold Eliquis for 1-2 days prior to procedure.     **This guidance is not considered finalized until pre-operative APP has relayed final recommendations.**

## 2023-02-23 ENCOUNTER — Inpatient Hospital Stay: Payer: Medicare Other | Attending: Nurse Practitioner

## 2023-02-23 ENCOUNTER — Inpatient Hospital Stay: Payer: Medicare Other | Admitting: Oncology

## 2023-02-23 ENCOUNTER — Inpatient Hospital Stay: Payer: Medicare Other

## 2023-02-23 VITALS — BP 127/70 | HR 76 | Temp 97.9°F | Resp 18 | Ht 67.0 in | Wt 227.0 lb

## 2023-02-23 DIAGNOSIS — Z800 Family history of malignant neoplasm of digestive organs: Secondary | ICD-10-CM | POA: Diagnosis not present

## 2023-02-23 DIAGNOSIS — E3400 Carcinoid syndrome, unspecified: Secondary | ICD-10-CM | POA: Diagnosis not present

## 2023-02-23 DIAGNOSIS — C7A8 Other malignant neuroendocrine tumors: Secondary | ICD-10-CM

## 2023-02-23 DIAGNOSIS — C7A098 Malignant carcinoid tumors of other sites: Secondary | ICD-10-CM | POA: Diagnosis not present

## 2023-02-23 DIAGNOSIS — I2510 Atherosclerotic heart disease of native coronary artery without angina pectoris: Secondary | ICD-10-CM | POA: Insufficient documentation

## 2023-02-23 DIAGNOSIS — I4891 Unspecified atrial fibrillation: Secondary | ICD-10-CM | POA: Diagnosis not present

## 2023-02-23 DIAGNOSIS — Z8673 Personal history of transient ischemic attack (TIA), and cerebral infarction without residual deficits: Secondary | ICD-10-CM | POA: Insufficient documentation

## 2023-02-23 LAB — CMP (CANCER CENTER ONLY)
ALT: 8 U/L (ref 0–44)
AST: 9 U/L — ABNORMAL LOW (ref 15–41)
Albumin: 4.4 g/dL (ref 3.5–5.0)
Alkaline Phosphatase: 77 U/L (ref 38–126)
Anion gap: 8 (ref 5–15)
BUN: 19 mg/dL (ref 8–23)
CO2: 24 mmol/L (ref 22–32)
Calcium: 9.7 mg/dL (ref 8.9–10.3)
Chloride: 106 mmol/L (ref 98–111)
Creatinine: 1.39 mg/dL — ABNORMAL HIGH (ref 0.61–1.24)
GFR, Estimated: 51 mL/min — ABNORMAL LOW (ref >60)
Glucose, Bld: 146 mg/dL — ABNORMAL HIGH (ref 70–99)
Potassium: 4.3 mmol/L (ref 3.5–5.1)
Sodium: 138 mmol/L (ref 135–145)
Total Bilirubin: 0.6 mg/dL (ref 0.3–1.2)
Total Protein: 7.1 g/dL (ref 6.5–8.1)

## 2023-02-23 MED ORDER — OCTREOTIDE ACETATE 20 MG IM KIT
20.0000 mg | PACK | Freq: Once | INTRAMUSCULAR | Status: AC
  Administered 2023-02-23: 20 mg via INTRAMUSCULAR
  Filled 2023-02-23: qty 1

## 2023-02-23 NOTE — Progress Notes (Signed)
[facility] OFFICE PROGRESS NOTE   Diagnosis: Pancreas neuroendocrine tumor  INTERVAL HISTORY:   Jake Dunn returns as scheduled.  He continues monthly Sandostatin.  He reports constipation for the past week.  He has intermittent nausea.  No other complaint.  Objective:  Vital signs in last 24 hours:  Blood pressure 127/70, pulse 76, temperature 97.9 F (36.6 C), temperature source Oral, resp. rate 18, height 5\' 7"  (1.702 m), weight 227 lb (103 kg), SpO2 98%.   Resp: Lungs clear bilaterally Cardio: Distant heart sounds, regular rate and rhythm GI: No hepatosplenomegaly, no mass, nontender Vascular: No leg edema    Lab Results:  Lab Results  Component Value Date   WBC 8.3 10/20/2022   HGB 13.7 10/20/2022   HCT 41.2 10/20/2022   MCV 96.7 10/20/2022   PLT 256 10/20/2022   NEUTROABS 5.9 10/20/2022    CMP  Lab Results  Component Value Date   NA 138 02/23/2023   K 4.3 02/23/2023   CL 106 02/23/2023   CO2 24 02/23/2023   GLUCOSE 146 (H) 02/23/2023   BUN 19 02/23/2023   CREATININE 1.39 (H) 02/23/2023   CALCIUM 9.7 02/23/2023   PROT 7.1 02/23/2023   ALBUMIN 4.4 02/23/2023   AST 9 (L) 02/23/2023   ALT 8 02/23/2023   ALKPHOS 77 02/23/2023   BILITOT 0.6 02/23/2023   GFRNONAA 51 (L) 02/23/2023   GFRAA 66 11/21/2019    Medications: I have reviewed the patient's current medications.   Assessment/Plan: Pancreas masses CT chest 06/05/2022-10 mm right hilar node, 9 mm central right upper lobe nodule, 2 contiguous lobulated masses in the central abdomen, left nephrectomy MRI abdomen 06/06/2022-2 adjacent hypervascular masses in the central pancreas with associated ductal dilatation and atrophy, probable prominent lymph node superior to the right mass, the right-sided mass causes mass effect at the celiac axis and portal vein Upper EUS 06/13/2022-mass identified in the pancreatic body, FNA performed, vascular involvement suspected.  Second similar smaller mass  seen immediately adjacent to the larger mass.  Cytology-malignant cells present, solid pseudopapillary neoplasm favored PET scan 07/04/2022-mild increased FDG uptake associated with the dominant mass centered around the pancreatic neck.  Also mild increased uptake associated with the mass within the tail of the pancreas.  Solid-appearing lesion or lymph node within the gastrohepatic ligament which directly abuts the superior margin of the dominant pancreatic mass, also exhibits mild increased FDG uptake.  Mild FDG uptake associated with a right upper lobe lung nodule EUS 07/27/2022-"Twin" masses identified in the pancreas body, no pathology in the left lobe of the liver or gallbladder, no adenopathy, FNA biopsy-"atypical cells present "-could not be further characterize due to lack of cellularity on cell block Dotatate PET 07/28/2022-moderate radiotracer activity associated with pancreatic lesions including a small pancreas tail nodule consistent with well-differentiated versus moderately differentiated neuroendocrine tumor, no metastatic adenopathy or distant metastatic disease Chromogranin A elevated on 08/15/2022 Monthly Sandostatin initiated 08/24/2022 CTs 12/26/2022-unchanged pancreas masses, no evidence of metastatic disease in the chest abdomen or pelvis, unchanged right upper lobe nodule suspicious for a primary lung tumor 2.   Remote history of renal cell carcinoma, status post a left nephrectomy 03/16/1994 status post left nephrectomy, renal cell carcinoma, pT2, pNX, pMX, tumor characterized by sheets and clusters of neoplastic clear to granular cells, consistent with a renal cell carcinoma grade 2, no definite diagnostic vascular invasion of larger vessels, tumor does not appear to extend through the renal capsule. 3.   Atrial fibrillation 4.  Coronary artery disease, coronary artery bypass surgery in 2020 5.   History of CVA 6.   Multiple spine surgeries 7.   Left upper lobe nodule and borderline  enlarged right hilar node on chest CT 06/05/2022 8.   Gout 9.   History of adenomatous polyps 10.  Family history of pancreas cancer 11.  Right upper lobe nodule with mild FDG uptake on PET 07/04/2022 Unchanged right upper lobe nodule on chest CT 12/26/2022       Disposition: Jake Dunn appears stable.  He continues monthly Sandostatin.  Will follow-up on the chromogranin a level from today.  He will return for a Sandostatin injection in 4 weeks and an office visit in 8 weeks.  Thornton Papas, MD  02/23/2023  10:21 AM

## 2023-02-24 LAB — CHROMOGRANIN A: Chromogranin A (ng/mL): 122 ng/mL — ABNORMAL HIGH (ref 0.0–101.8)

## 2023-03-10 ENCOUNTER — Other Ambulatory Visit: Payer: Self-pay | Admitting: Cardiology

## 2023-03-21 DIAGNOSIS — D128 Benign neoplasm of rectum: Secondary | ICD-10-CM | POA: Diagnosis not present

## 2023-03-21 DIAGNOSIS — D120 Benign neoplasm of cecum: Secondary | ICD-10-CM | POA: Diagnosis not present

## 2023-03-21 DIAGNOSIS — Z860101 Personal history of adenomatous and serrated colon polyps: Secondary | ICD-10-CM | POA: Diagnosis not present

## 2023-03-21 DIAGNOSIS — Z800 Family history of malignant neoplasm of digestive organs: Secondary | ICD-10-CM | POA: Diagnosis not present

## 2023-03-21 DIAGNOSIS — Z09 Encounter for follow-up examination after completed treatment for conditions other than malignant neoplasm: Secondary | ICD-10-CM | POA: Diagnosis not present

## 2023-03-21 DIAGNOSIS — K635 Polyp of colon: Secondary | ICD-10-CM | POA: Diagnosis not present

## 2023-03-21 DIAGNOSIS — D123 Benign neoplasm of transverse colon: Secondary | ICD-10-CM | POA: Diagnosis not present

## 2023-03-21 DIAGNOSIS — K648 Other hemorrhoids: Secondary | ICD-10-CM | POA: Diagnosis not present

## 2023-03-23 ENCOUNTER — Telehealth: Payer: Self-pay | Admitting: *Deleted

## 2023-03-23 ENCOUNTER — Ambulatory Visit: Payer: Medicare Other | Admitting: Nurse Practitioner

## 2023-03-23 ENCOUNTER — Other Ambulatory Visit: Payer: Medicare Other

## 2023-03-23 ENCOUNTER — Inpatient Hospital Stay: Payer: Medicare Other | Attending: Nurse Practitioner

## 2023-03-23 DIAGNOSIS — D6869 Other thrombophilia: Secondary | ICD-10-CM | POA: Diagnosis not present

## 2023-03-23 DIAGNOSIS — I110 Hypertensive heart disease with heart failure: Secondary | ICD-10-CM | POA: Diagnosis not present

## 2023-03-23 DIAGNOSIS — D128 Benign neoplasm of rectum: Secondary | ICD-10-CM | POA: Diagnosis not present

## 2023-03-23 DIAGNOSIS — D123 Benign neoplasm of transverse colon: Secondary | ICD-10-CM | POA: Diagnosis not present

## 2023-03-23 DIAGNOSIS — K635 Polyp of colon: Secondary | ICD-10-CM | POA: Diagnosis not present

## 2023-03-23 DIAGNOSIS — Z23 Encounter for immunization: Secondary | ICD-10-CM | POA: Diagnosis not present

## 2023-03-23 DIAGNOSIS — I480 Paroxysmal atrial fibrillation: Secondary | ICD-10-CM | POA: Diagnosis not present

## 2023-03-23 DIAGNOSIS — D120 Benign neoplasm of cecum: Secondary | ICD-10-CM | POA: Diagnosis not present

## 2023-03-23 DIAGNOSIS — I5032 Chronic diastolic (congestive) heart failure: Secondary | ICD-10-CM | POA: Diagnosis not present

## 2023-03-23 NOTE — Telephone Encounter (Signed)
LVM for daughter re: missed Sando injection today. Requested return call to reschedule or update if he is not doing well.

## 2023-03-24 ENCOUNTER — Telehealth: Payer: Self-pay | Admitting: *Deleted

## 2023-03-24 NOTE — Telephone Encounter (Signed)
Left VM for daughter to reschedule his missed SandoLAR injection.

## 2023-03-25 ENCOUNTER — Encounter (HOSPITAL_COMMUNITY): Payer: Self-pay

## 2023-03-25 ENCOUNTER — Emergency Department (HOSPITAL_COMMUNITY)
Admission: EM | Admit: 2023-03-25 | Discharge: 2023-03-25 | Disposition: A | Discharge status: Home / Self Care | Payer: Medicare Other | Attending: Emergency Medicine | Admitting: Emergency Medicine

## 2023-03-25 ENCOUNTER — Emergency Department (HOSPITAL_COMMUNITY): Payer: Medicare Other

## 2023-03-25 ENCOUNTER — Other Ambulatory Visit: Payer: Self-pay

## 2023-03-25 DIAGNOSIS — I6782 Cerebral ischemia: Secondary | ICD-10-CM | POA: Diagnosis not present

## 2023-03-25 DIAGNOSIS — Z91040 Latex allergy status: Secondary | ICD-10-CM | POA: Insufficient documentation

## 2023-03-25 DIAGNOSIS — R519 Headache, unspecified: Secondary | ICD-10-CM | POA: Diagnosis not present

## 2023-03-25 DIAGNOSIS — Z7982 Long term (current) use of aspirin: Secondary | ICD-10-CM | POA: Insufficient documentation

## 2023-03-25 DIAGNOSIS — R0902 Hypoxemia: Secondary | ICD-10-CM | POA: Diagnosis not present

## 2023-03-25 DIAGNOSIS — I213 ST elevation (STEMI) myocardial infarction of unspecified site: Secondary | ICD-10-CM | POA: Diagnosis not present

## 2023-03-25 DIAGNOSIS — R42 Dizziness and giddiness: Secondary | ICD-10-CM | POA: Insufficient documentation

## 2023-03-25 DIAGNOSIS — Z7901 Long term (current) use of anticoagulants: Secondary | ICD-10-CM | POA: Insufficient documentation

## 2023-03-25 DIAGNOSIS — R55 Syncope and collapse: Secondary | ICD-10-CM | POA: Insufficient documentation

## 2023-03-25 DIAGNOSIS — I4430 Unspecified atrioventricular block: Secondary | ICD-10-CM | POA: Diagnosis not present

## 2023-03-25 DIAGNOSIS — R112 Nausea with vomiting, unspecified: Secondary | ICD-10-CM | POA: Diagnosis not present

## 2023-03-25 LAB — COMPREHENSIVE METABOLIC PANEL
ALT: 10 U/L (ref 0–44)
AST: 10 U/L — ABNORMAL LOW (ref 15–41)
Albumin: 3.4 g/dL — ABNORMAL LOW (ref 3.5–5.0)
Alkaline Phosphatase: 66 U/L (ref 38–126)
Anion gap: 9 (ref 5–15)
BUN: 15 mg/dL (ref 8–23)
CO2: 21 mmol/L — ABNORMAL LOW (ref 22–32)
Calcium: 8.6 mg/dL — ABNORMAL LOW (ref 8.9–10.3)
Chloride: 108 mmol/L (ref 98–111)
Creatinine, Ser: 1.25 mg/dL — ABNORMAL HIGH (ref 0.61–1.24)
GFR, Estimated: 58 mL/min — ABNORMAL LOW (ref >60)
Glucose, Bld: 135 mg/dL — ABNORMAL HIGH (ref 70–99)
Potassium: 3.8 mmol/L (ref 3.5–5.1)
Sodium: 138 mmol/L (ref 135–145)
Total Bilirubin: 1.2 mg/dL — ABNORMAL HIGH (ref <1.2)
Total Protein: 6.1 g/dL — ABNORMAL LOW (ref 6.5–8.1)

## 2023-03-25 LAB — CBC WITH DIFFERENTIAL/PLATELET
Abs Immature Granulocytes: 0.04 10*3/uL (ref 0.00–0.07)
Basophils Absolute: 0 10*3/uL (ref 0.0–0.1)
Basophils Relative: 1 %
Eosinophils Absolute: 0.3 10*3/uL (ref 0.0–0.5)
Eosinophils Relative: 3 %
HCT: 38.1 % — ABNORMAL LOW (ref 39.0–52.0)
Hemoglobin: 12.5 g/dL — ABNORMAL LOW (ref 13.0–17.0)
Immature Granulocytes: 1 %
Lymphocytes Relative: 10 %
Lymphs Abs: 0.7 10*3/uL (ref 0.7–4.0)
MCH: 31.9 pg (ref 26.0–34.0)
MCHC: 32.8 g/dL (ref 30.0–36.0)
MCV: 97.2 fL (ref 80.0–100.0)
Monocytes Absolute: 0.7 10*3/uL (ref 0.1–1.0)
Monocytes Relative: 9 %
Neutro Abs: 5.8 10*3/uL (ref 1.7–7.7)
Neutrophils Relative %: 76 %
Platelets: 190 10*3/uL (ref 150–400)
RBC: 3.92 MIL/uL — ABNORMAL LOW (ref 4.22–5.81)
RDW: 13.9 % (ref 11.5–15.5)
WBC: 7.6 10*3/uL (ref 4.0–10.5)
nRBC: 0 % (ref 0.0–0.2)

## 2023-03-25 MED ORDER — PROCHLORPERAZINE EDISYLATE 10 MG/2ML IJ SOLN
10.0000 mg | Freq: Once | INTRAMUSCULAR | Status: AC
  Administered 2023-03-25: 10 mg via INTRAVENOUS
  Filled 2023-03-25: qty 2

## 2023-03-25 MED ORDER — SODIUM CHLORIDE 0.9 % IV BOLUS
2000.0000 mL | Freq: Once | INTRAVENOUS | Status: AC
  Administered 2023-03-25: 2000 mL via INTRAVENOUS

## 2023-03-25 MED ORDER — PROCHLORPERAZINE 25 MG RE SUPP: 25.0000 mg | Freq: Two times a day (BID) | RECTAL | 1 refills | Status: DC | PRN

## 2023-03-25 NOTE — Discharge Instructions (Signed)
Follow-up with your family doctor this week.  Take it easy over the weekend and drink plenty of fluids.  Use the Compazine suppositories for nausea if the Zofran is not working

## 2023-03-25 NOTE — ED Triage Notes (Signed)
Pt bib ems from a restaurant. Pt suddenly felt dizzy, +LOC, pt vomited en route to ED, pt given 4mg  zofran IV by EMS. Pt currently c.o nausea, headache and dizziness. Pt a.o Bo low upon arrival to ED 85/60

## 2023-03-25 NOTE — ED Provider Notes (Signed)
[place] EMERGENCY DEPARTMENT AT [facility] Provider Note   CSN: [id number] Arrival date & time: 03/25/23  1115     History {Add pertinent medical, surgical, social history, OB history to HPI:1} Chief Complaint  Patient presents with   Loss of Consciousness   Dizziness   Emesis    Jake Dunn is a 82 y.o. male.  Patient gets infusions over the oncology area and frequently gets nauseated.  Today he was very nauseated and vomiting he nearly passed out.   Loss of Consciousness Associated symptoms: dizziness and vomiting   Dizziness Associated symptoms: syncope and vomiting   Emesis      Home Medications Prior to Admission medications   Medication Sig Start Date End Date Taking? Authorizing Provider  prochlorperazine (COMPAZINE) 25 MG suppository Place 1 suppository (25 mg total) rectally every 12 (twelve) hours as needed for nausea or vomiting. 03/25/23  Yes Bethann Berkshire, MD  acyclovir (ZOVIRAX) 400 MG tablet Take 400 mg by mouth 3 (three) times daily as needed (For cold sores). . Patient not taking: Reported on 02/23/2023 10/05/16   [provider]  albuterol (VENTOLIN HFA) 108 (90 Base) MCG/ACT inhaler 2 puffs every 6 (six) hours as needed.    [provider]  allopurinol (ZYLOPRIM) 300 MG tablet Take 300 mg by mouth daily.    [provider]  apixaban (ELIQUIS) 5 MG TABS tablet TAKE 1 TABLET BY MOUTH TWICE A DAY 02/10/23   Little Ishikawa, MD  aspirin EC 81 MG tablet Take 81 mg by mouth daily.    [provider]  atorvastatin (LIPITOR) 80 MG tablet Take 1 tablet (80 mg total) by mouth daily. 08/10/21 02/23/23  [place], Peter M, MD  furosemide (LASIX) 20 MG tablet Take 1 tablet (20 mg total) by mouth daily as needed (weight gain of 3 pounds in one day or 5 pounds in a week). Patient not taking: Reported on 02/23/2023 06/08/22   Little Ishikawa, MD  HYDROcodone-acetaminophen (NORCO/VICODIN) 5-325 MG tablet Take  1 tablet by mouth 3 (three) times daily as needed for moderate pain. 06/11/21   Lewie Chamber, MD  isosorbide mononitrate (IMDUR) 60 MG 24 hr tablet Take 1 tablet (60 mg total) by mouth daily. 10/27/22 02/23/23  Rollene Rotunda, MD  LORazepam (ATIVAN) 0.5 MG tablet Take 1 tablet (0.5 mg total) by mouth 2 (two) times daily as needed for anxiety. 06/11/21   Lewie Chamber, MD  metoprolol tartrate (LOPRESSOR) 25 MG tablet Take 25 mg by mouth 2 (two) times daily.    [provider]  nitroGLYCERIN (NITROSTAT) 0.4 MG SL tablet Place 1 tablet (0.4 mg total) under the tongue every 5 (five) minutes as needed for chest pain. Patient not taking: Reported on 08/09/2022 02/07/22 07/25/22  [place], Peter M, MD  ondansetron (ZOFRAN) 8 MG tablet TAKE 1 TABLET BY MOUTH EVERY 8 HOURS AS NEEDED FOR NAUSEA AND/OR VOMITING 12/30/22   Ladene Artist, MD  pantoprazole (PROTONIX) 40 MG tablet Take 40 mg by mouth every evening.     [provider]  prochlorperazine (COMPAZINE) 10 MG tablet TAKE 1 TABLET BY MOUTH EVERY 6 HOURS AS NEEDED 12/30/22   Ladene Artist, MD  ranolazine (RANEXA) 1000 MG SR tablet TAKE 1 TABLET BY MOUTH TWO TIMES A DAY 03/10/23   Little Ishikawa, MD  sertraline (ZOLOFT) 50 MG tablet Take 50 mg by mouth daily. 06/07/22   [provider]  TRELEGY ELLIPTA 100-62.5-25 MCG/ACT AEPB Inhale 1 puff into  the lungs daily. 10/16/22   [provider]      Allergies    Latex and Tape    Review of Systems   Review of Systems  Cardiovascular:  Positive for syncope.  Gastrointestinal:  Positive for vomiting.  Neurological:  Positive for dizziness.    Physical Exam Updated Vital Signs BP (!) 140/80   Pulse 63   Temp 97.7 F (36.5 C) (Oral)   Resp 14   Ht 5\' 7"  (1.702 m)   Wt 105.2 kg   SpO2 100%   BMI 36.34 kg/m  Physical Exam  ED Results / Procedures / Treatments   Labs (all labs ordered are listed, but only abnormal results are displayed) Labs Reviewed  CBC  WITH DIFFERENTIAL/PLATELET - Abnormal; Notable for the following components:      Result Value   RBC 3.92 (*)    Hemoglobin 12.5 (*)    HCT 38.1 (*)    All other components within normal limits  COMPREHENSIVE METABOLIC PANEL - Abnormal; Notable for the following components:   CO2 21 (*)    Glucose, Bld 135 (*)    Creatinine, Ser 1.25 (*)    Calcium 8.6 (*)    Total Protein 6.1 (*)    Albumin 3.4 (*)    AST 10 (*)    Total Bilirubin 1.2 (*)    GFR, Estimated 58 (*)    All other components within normal limits    EKG EKG Interpretation Date/Time:  Saturday March 25 2023 11:24:16 EST Ventricular Rate:  57 PR Interval:  216 QRS Duration:  111 QT Interval:  476 QTC Calculation: 464 R Axis:   28  Text Interpretation: Sinus rhythm Borderline prolonged PR interval Nonspecific T abnormalities, anterior leads Confirmed by Bethann Berkshire [phone]) on 03/25/2023 11:29:31 AM  Radiology CT Head Wo Contrast  Result Date: 03/25/2023 CLINICAL DATA:  Headache, increasing frequency or severity. EXAM: CT HEAD WITHOUT CONTRAST TECHNIQUE: Contiguous axial images were obtained from the base of the skull through the vertex without intravenous contrast. RADIATION DOSE REDUCTION: This exam was performed according to the departmental dose-optimization program which includes automated exposure control, adjustment of the mA and/or kV according to patient size and/or use of iterative reconstruction technique. COMPARISON:  None Available. FINDINGS: Brain: There is no evidence of an acute cortical infarct, intracranial hemorrhage, mass, midline shift, or extra-axial fluid collection. Patchy hypodensities in the cerebral white matter bilaterally are nonspecific but compatible with moderate chronic small vessel ischemic disease. Lacunar infarcts are present in the basal ganglia bilaterally, some of which are chronic in appearance while others are of indeterminate age. There is mild cerebral atrophy. Vascular:  Calcified atherosclerosis at the skull base. No hyperdense vessel. Skull: No acute fracture or suspicious osseous lesion. Sinuses/Orbits: Minimal mucosal thickening in the paranasal sinuses. Clear mastoid air cells. Bilateral cataract extraction. Other: None. IMPRESSION: 1. No intracranial hemorrhage or large acute infarct. 2. Moderate chronic small vessel ischemic disease with age indeterminate lacunar infarcts in the basal ganglia. Electronically Signed   By: Sebastian Ache M.D.   On: 03/25/2023 12:00    Procedures Procedures  {Document cardiac monitor, telemetry assessment procedure when appropriate:1}  Medications Ordered in ED Medications  sodium chloride 0.9 % bolus 2,000 mL (0 mLs Intravenous Stopped 03/25/23 1411)  prochlorperazine (COMPAZINE) injection 10 mg (10 mg Intravenous Given 03/25/23 1209)    ED Course/ Medical Decision Making/ A&P  Patient's nausea improved with Compazine and fluids.  CT scan is negative. {   Click  here for ABCD2, HEART and other calculatorsREFRESH Note before signing :1}                              Medical Decision Making Amount and/or Complexity of Data Reviewed Labs: ordered. Radiology: ordered.  Risk Prescription drug management.   Vomiting secondary to infusions he gets at the oncology center.  Patient given Compazine and will follow-up with PCP  {Document critical care time when appropriate:1} {Document review of labs and clinical decision tools ie heart score, Chads2Vasc2 etc:1}  {Document your independent review of radiology images, and any outside records:1} {Document your discussion with family members, caretakers, and with consultants:1} {Document social determinants of health affecting pt's care:1} {Document your decision making why or why not admission, treatments were needed:1} Final Clinical Impression(s) / ED Diagnoses Final diagnoses:  Near syncope    Rx / DC Orders ED Discharge Orders          Ordered    prochlorperazine  (COMPAZINE) 25 MG suppository  Every 12 hours PRN        03/25/23 1427

## 2023-03-28 ENCOUNTER — Other Ambulatory Visit: Payer: Self-pay | Admitting: Oncology

## 2023-04-10 ENCOUNTER — Ambulatory Visit: Payer: Medicare Other | Attending: Nurse Practitioner | Admitting: Nurse Practitioner

## 2023-04-10 NOTE — Progress Notes (Unsigned)
Office Visit    Patient Name: Jake Dunn Date of Encounter: 04/10/2023  Primary Care Provider:  Laurann Montana, MD Primary Cardiologist:  [name], MD  Chief Complaint    82 year old male with a history of CAD s/p CABG x 4 (LIMA-LAD, SVG-PLD, SVG-Ramus, SVG-D2) in 2020, paroxysmal atrial fibrillation, chronic diastolic heart failure, near syncope, chronic shortness of breath, hypertension, hyperlipidemia, CVA, neuroendocrine tumor, renal cell CA s/p L nephrectomy, and cervical/lumbar spine disease who presents for follow-up related to CAD and atrial fibrillation   Past Medical History    Past Medical History:  Diagnosis Date   Adenomatous polyp    Anxiety    Arthritis    "knees, ankles, shoulders" (08/15/2016)   CAD (coronary artery disease)    s/p cath in 2014 showing significant stenosis in the diagonal side branches and in the RV marginal branch. These vessels were small and diffusely diseased. He is managed medically. 08/16/16 Cath, no disease progression   Chronic lower back pain    Disc disease, degenerative, cervical    GERD (gastroesophageal reflux disease)    Gout    Hyperlipidemia    Hypertension    Osteoarthritis    Renal cell carcinoma    left kidney removal   Stroke [facility])    MINI STROKE 15+ YRS AGO, no residual   Stroke (HCC)    "I've had 2 or 3"; denies residual on 08/15/2016   Syncope and collapse    Past Surgical History:  Procedure Laterality Date   ANTERIOR CERVICAL DECOMP/DISCECTOMY FUSION  02/2004; 04/2005   Hattie Perch 09/20/2010; Hattie Perch 09/20/2010   APPLICATION OF ROBOTIC ASSISTANCE FOR SPINAL PROCEDURE N/A 01/02/2018   Procedure: APPLICATION OF ROBOTIC ASSISTANCE FOR SPINAL PROCEDURE;  Surgeon: Barnett Abu, MD;  Location: [facility] OR;  Service: Neurosurgery;  Laterality: N/A;   BACK SURGERY     CARDIAC CATHETERIZATION  12/14/2008   ef 50-55%. SHOWED SIGNIFICANT STENOSIS AND TO DIAGONAL SIDE BRANCHES AND IN THE RIGHT VENTRICULAR MARGINAL BRANCH. THESE  VESSELS WERE SMALL AND DIFFUSELY DISEASED   CARDIOVASCULAR STRESS TEST  12/10/2009   EF 61%. EKG negative. Mild peri ischemia. Managed medically.    CARPAL TUNNEL RELEASE Right 11/2005   Hattie Perch 09/20/2010   CARPAL TUNNEL RELEASE Left 06/2007   Hattie Perch 5/132012   COLONOSCOPY  11/2004   Hattie Perch 09/20/2010   COLONOSCOPY W/ BIOPSIES AND POLYPECTOMY  09/2002   Hattie Perch 09/20/2010   CORONARY ARTERY BYPASS GRAFT N/A 04/02/2019   Procedure: CORONARY ARTERY BYPASS GRAFTING (CABG) using LIMA to LAD; Endoscopically harvested right greater saphenous vein to the PLB, Ramus, and Diag 2.;  Surgeon: Corliss Skains, MD;  Location: [facility] OR;  Service: Open Heart Surgery;  Laterality: N/A;   ESOPHAGOGASTRODUODENOSCOPY (EGD) WITH PROPOFOL N/A 06/13/2022   Procedure: ESOPHAGOGASTRODUODENOSCOPY (EGD) WITH PROPOFOL;  Surgeon: Willis Modena, MD;  Location: [facility];  Service: Gastroenterology;  Laterality: N/A;   ESOPHAGOGASTRODUODENOSCOPY (EGD) WITH PROPOFOL Bilateral 07/27/2022   Procedure: ESOPHAGOGASTRODUODENOSCOPY (EGD) WITH PROPOFOL;  Surgeon: Willis Modena, MD;  Location: [facility];  Service: Gastroenterology;  Laterality: Bilateral;   EUS Bilateral 07/27/2022   Procedure: UPPER ENDOSCOPIC ULTRASOUND (EUS) LINEAR;  Surgeon: Willis Modena, MD;  Location: [facility];  Service: Gastroenterology;  Laterality: Bilateral;   FINE NEEDLE ASPIRATION N/A 06/13/2022   Procedure: FINE NEEDLE ASPIRATION (FNA) LINEAR;  Surgeon: Willis Modena, MD;  Location: [facility];  Service: Gastroenterology;  Laterality: N/A;   FINE NEEDLE ASPIRATION Bilateral 07/27/2022   Procedure: FINE NEEDLE ASPIRATION (FNA) LINEAR;  Surgeon: Willis Modena,  MD;  Location: [facility];  Service: Gastroenterology;  Laterality: Bilateral;   INCISION AND DRAINAGE ABSCESS Right 10/18/2016   Procedure: INCISION AND DRAINAGE ABSCESS RIGHT SHOULDER;  Surgeon: Sheral Apley, MD;  Location: [facility];  Service: Orthopedics;   Laterality: Right;   INCISION AND DRAINAGE OF WOUND Right 08/2005   shoulder/notes 09/20/2010   IR FLUORO GUIDE CV LINE LEFT  10/19/2016   IR US GUIDE VASC ACCESS LEFT  10/19/2016   IRRIGATION AND DEBRIDEMENT SHOULDER Right 12/22/2016   Procedure: IRRIGATION AND DEBRIDEMENT RIGHT SHOULDER;  Surgeon: Loreta Ave, MD;  Location: [facility] OR;  Service: Orthopedics;  Laterality: Right;   JOINT REPLACEMENT     LEFT HEART CATH AND CORONARY ANGIOGRAPHY N/A 08/16/2016   Procedure: Left Heart Cath and Coronary Angiography;  Surgeon: Tonny Bollman, MD;  Location: [facility] INVASIVE CV LAB;  Service: Cardiovascular;  Laterality: N/A;   LEFT HEART CATH AND CORONARY ANGIOGRAPHY N/A 03/26/2019   Procedure: LEFT HEART CATH AND CORONARY ANGIOGRAPHY;  Surgeon: Swaziland, Peter M, MD;  Location: [facility] INVASIVE CV LAB;  Service: Cardiovascular;  Laterality: N/A;   LEFT HEART CATH AND CORS/GRAFTS ANGIOGRAPHY N/A 12/21/2020   Procedure: LEFT HEART CATH AND CORS/GRAFTS ANGIOGRAPHY;  Surgeon: Swaziland, Peter M, MD;  Location: [facility] INVASIVE CV LAB;  Service: Cardiovascular;  Laterality: N/A;   LEFT HEART CATH AND CORS/GRAFTS ANGIOGRAPHY N/A 06/03/2022   Procedure: LEFT HEART CATH AND CORS/GRAFTS ANGIOGRAPHY;  Surgeon: Tonny Bollman, MD;  Location: [facility] INVASIVE CV LAB;  Service: Cardiovascular;  Laterality: N/A;   LEFT HEART CATHETERIZATION WITH CORONARY ANGIOGRAM N/A 08/09/2012   Procedure: LEFT HEART CATHETERIZATION WITH CORONARY ANGIOGRAM;  Surgeon: Peter M Swaziland, MD;  Location: [facility] CATH LAB;  Service: Cardiovascular;  Laterality: N/A;   LUMBAR FUSION     NEPHRECTOMY Left early 2000s   REPLACEMENT TOTAL KNEE Right 08/2008   Hattie Perch 09/07/2010   SHOULDER ARTHROSCOPY WITH ROTATOR CUFF REPAIR Right 06/2005   Hattie Perch 09/20/2010   TOTAL KNEE ARTHROPLASTY  06/08/2011   Procedure: TOTAL KNEE ARTHROPLASTY;  Surgeon: Loreta Ave, MD;  Location: [facility] OR;  Service: Orthopedics;  Laterality: Left;   TOTAL SHOULDER ARTHROPLASTY Right 04/06/2016    Procedure: RIGHT REVERSE TOTAL SHOULDER ARTHROPLASTY;  Surgeon: Loreta Ave, MD;  Location: [facility] OR;  Service: Orthopedics;  Laterality: Right;   TOTAL SHOULDER REVISION Right 12/22/2016   Procedure: RIGHT SHOULDER GLENOID AND HUMERAL COMPONENT REVISION;  Surgeon: Loreta Ave, MD;  Location: [facility] OR;  Service: Orthopedics;  Laterality: Right;   UPPER ESOPHAGEAL ENDOSCOPIC ULTRASOUND (EUS) Bilateral 06/13/2022   Procedure: UPPER ESOPHAGEAL ENDOSCOPIC ULTRASOUND (EUS);  Surgeon: Willis Modena, MD;  Location: [facility];  Service: Gastroenterology;  Laterality: Bilateral;   US ECHOCARDIOGRAPHY  12/15/2008   EF 60-65%   US ECHOCARDIOGRAPHY  09/28/2004   EF 55-60%    Allergies  Allergies  Allergen Reactions   Latex Rash    Other reaction(s): Hives/Skin Rash/Itching   Tape Rash    Paper tape okay Other reaction(s): Hives/Rash/Itching     Labs/Other Studies Reviewed    The following studies were reviewed today:  Cardiac Studies & Procedures   CARDIAC CATHETERIZATION  CARDIAC CATHETERIZATION 06/03/2022  Narrative Multivessel CAD with mild-moderate left main stenosis, moderate LAD stenosis, severe ramus stenosis, severe PLA stenosis S/P CABG with continued graft patency as outlined Normal LVEDP  Med Rx. No culprit for progressive angina - stable anatomy from prior cath studies. Start apixaban tomorrow for new onset afib. Likely DC tomorrow if stable.  Findings Coronary Findings Diagnostic  Dominance: Right  Left Main Ost LM to Mid LM lesion is 50% stenosed. The lesion is severely calcified. No significant change from prior. No pressure dampening.  Left Anterior Descending Diffuse mid-vessel disease without high-grade stenosis Mid LAD lesion is 65% stenosed. The lesion is severely calcified.  Second Diagonal Branch 2nd Diag lesion is 90% stenosed.  Ramus Intermedius Vessel is large. Ramus lesion is 90% stenosed. The lesion is eccentric. The lesion is severely  calcified.  Left Circumflex Small vessel Ost Cx to Mid Cx lesion is 80% stenosed.  Right Coronary Artery There is mild diffuse disease throughout the vessel. Dominant vessel, no high-grade obstruction, unchanged from prior cath Prox RCA lesion is 45% stenosed. The lesion is severely calcified.  Second Right Posterolateral Branch 2nd RPL lesion is 80% stenosed.  LIMA LIMA Graft To 2nd Diag LIMA and is normal in caliber.  The graft exhibits no disease. LIMA-diagonal patent  Saphenous Graft To Ramus SVG and is normal in caliber.  The graft exhibits no disease. SVG-Ramus patent  Saphenous Graft To 2nd RPL SVG and is normal in caliber.  The graft exhibits no disease. SVG-PLA patent  Intervention  No interventions have been documented.   CARDIAC CATHETERIZATION  CARDIAC CATHETERIZATION 12/21/2020  Narrative   Ost Cx to Mid Cx lesion is 80% stenosed.   Prox RCA lesion is 45% stenosed.   Ramus lesion is 90% stenosed.   2nd RPL lesion is 80% stenosed.   Mid LAD lesion is 65% stenosed.   Ost LM to Mid LM lesion is 40% stenosed.   2nd Diag lesion is 90% stenosed.   LIMA graft was visualized by angiography and is normal in caliber.   SVG graft was visualized by angiography and is normal in caliber.   SVG graft was visualized by angiography and is normal in caliber.   The graft exhibits no disease.   The graft exhibits no disease.   The graft exhibits no disease.   LV end diastolic pressure is normal.  3 vessel obstructive CAD Patent LIMA - this is tied to the second diagonal and not the LAD Patent SVG to the ramus intermediate. No visualization of SVG to diagonal which per op note came off the hood of the SVG to ramus Patent SVG to the PL branch of the RCA Normal LVEDP  Plan: there appears to be good blood flow to all major vessels. The LAD was not revascularized but has borderline disease and the patient had prior Myoview with no ischemia in this territory. Other grafts are  patent. Recommend continued medical therapy. Does not appear to be volume overloaded. Echo pending.  Given significant difficulty from the radial approach I would recommend femoral access for any future Cardiac cath procedures.  Findings Coronary Findings Diagnostic  Dominance: Right  Left Main Ost LM to Mid LM lesion is 40% stenosed. The lesion is severely calcified.  Left Anterior Descending Mid LAD lesion is 65% stenosed. The lesion is severely calcified.  Second Diagonal Branch 2nd Diag lesion is 90% stenosed.  Ramus Intermedius Vessel is large. Ramus lesion is 90% stenosed. The lesion is eccentric. The lesion is severely calcified.  Left Circumflex Ost Cx to Mid Cx lesion is 80% stenosed.  Right Coronary Artery Prox RCA lesion is 45% stenosed. The lesion is severely calcified.  Second Right Posterolateral Branch 2nd RPL lesion is 80% stenosed.  LIMA LIMA Graft To 2nd Diag LIMA graft was visualized by angiography and is normal in caliber.  The graft exhibits no disease.  Saphenous Graft To Ramus SVG graft was visualized by angiography and is normal in caliber.  The graft exhibits no disease.  Saphenous Graft To 2nd RPL SVG graft was visualized by angiography and is normal in caliber.  The graft exhibits no disease.  Intervention  No interventions have been documented.   STRESS TESTS  MYOCARDIAL PERFUSION IMAGING 05/29/2020  Narrative  The left ventricular ejection fraction is mildly decreased (45-54%).  Nuclear stress EF: 48% with septal wall hypokinesis secondary to prior CABG  There was no ST segment deviation noted during stress.  This is a low risk study based despite mildly reduced ejection fraction calculation of 48%. There is no evidence of ischemia or infarction on stress images.  Donato Schultz, MD   ECHOCARDIOGRAM  ECHOCARDIOGRAM COMPLETE 06/04/2022  Narrative ECHOCARDIOGRAM REPORT    Patient Name:   Jake Dunn Date of Exam:  06/04/2022 Medical Rec #:  [id number]      Height:       67.0 in Accession #:    [id number]     Weight:       227.1 lb Date of Birth:  February 18, 1941      BSA:          2.134 m Patient Age:    81 years       BP:           107/64 mmHg Patient Gender: M              HR:           65 bpm. Exam Location:  Inpatient  Procedure: 2D Echo, Cardiac Doppler, Color Doppler and Intracardiac Opacification Agent  Indications:    Chest Pain R07.9  History:        Patient has prior history of Echocardiogram examinations, most recent 03/30/2019. Stroke; Risk Factors:Hypertension and Dyslipidemia.  Sonographer:    Leta Jungling RDCS Referring Phys: [phone] MICHAEL COOPER  IMPRESSIONS   1. Pt in atrial fibrillation at time of study. 2. Left ventricular ejection fraction, by estimation, is 50 to 55%. The left ventricle has low normal function. The left ventricle has no regional wall motion abnormalities. The left ventricular internal cavity size was mildly dilated. There is mild concentric left ventricular hypertrophy. Left ventricular diastolic parameters are indeterminate. 3. Right ventricular systolic function is normal. The right ventricular size is normal. There is normal pulmonary artery systolic pressure. 4. Left atrial size was mildly dilated. 5. Right atrial size was mildly dilated. 6. The mitral valve is grossly normal. Mild mitral valve regurgitation. No evidence of mitral stenosis. 7. The aortic valve is tricuspid. There is mild calcification of the aortic valve. Aortic valve regurgitation is not visualized. Aortic valve sclerosis is present, with no evidence of aortic valve stenosis. 8. Aortic dilatation noted. There is mild dilatation of the aortic root, measuring 40 mm. 9. The inferior vena cava is normal in size with greater than 50% respiratory variability, suggesting right atrial pressure of 3 mmHg.  FINDINGS Left Ventricle: Left ventricular ejection fraction, by estimation, is 50 to 55%.  The left ventricle has low normal function. The left ventricle has no regional wall motion abnormalities. Definity contrast agent was given IV to delineate the left ventricular endocardial borders. The left ventricular internal cavity size was mildly dilated. There is mild concentric left ventricular hypertrophy. Left ventricular diastolic function could not be evaluated due to atrial fibrillation. Left ventricular diastolic parameters are indeterminate.  Right Ventricle: The right ventricular  size is normal. Right ventricular systolic function is normal. There is normal pulmonary artery systolic pressure. The tricuspid regurgitant velocity is 2.49 m/s, and with an assumed right atrial pressure of 3 mmHg, the estimated right ventricular systolic pressure is 27.8 mmHg.  Left Atrium: Left atrial size was mildly dilated.  Right Atrium: Right atrial size was mildly dilated.  Pericardium: There is no evidence of pericardial effusion.  Mitral Valve: The mitral valve is grossly normal. Mild mitral annular calcification. Mild mitral valve regurgitation. No evidence of mitral valve stenosis.  Tricuspid Valve: The tricuspid valve is grossly normal. Tricuspid valve regurgitation is mild . No evidence of tricuspid stenosis.  Aortic Valve: The aortic valve is tricuspid. There is mild calcification of the aortic valve. Aortic valve regurgitation is not visualized. Aortic valve sclerosis is present, with no evidence of aortic valve stenosis.  Pulmonic Valve: The pulmonic valve was grossly normal. Pulmonic valve regurgitation is trivial. No evidence of pulmonic stenosis.  Aorta: Aortic dilatation noted. There is mild dilatation of the aortic root, measuring 40 mm.  Venous: The inferior vena cava is normal in size with greater than 50% respiratory variability, suggesting right atrial pressure of 3 mmHg.  IAS/Shunts: No atrial level shunt detected by color flow Doppler.  Additional Comments: Pt in atrial  fibrillation at time of study.   LEFT VENTRICLE PLAX 2D LVIDd:         5.70 cm      Diastology LVIDs:         4.80 cm      LV e' medial:    6.90 cm/s LV PW:         1.30 cm      LV E/e' medial:  17.0 LV IVS:        1.30 cm      LV e' lateral:   9.89 cm/s LVOT diam:     2.50 cm      LV E/e' lateral: 11.8 LV SV:         72 LV SV Index:   34 LVOT Area:     4.91 cm  LV Volumes (MOD) LV vol d, MOD A2C: 90.8 ml LV vol d, MOD A4C: 130.0 ml LV vol s, MOD A2C: 28.0 ml LV vol s, MOD A4C: 65.3 ml LV SV MOD A2C:     62.8 ml LV SV MOD A4C:     130.0 ml LV SV MOD BP:      72.4 ml  RIGHT VENTRICLE RV S prime:     7.62 cm/s  LEFT ATRIUM             Index LA diam:        4.70 cm 2.20 cm/m LA Vol (A2C):   71.8 ml 33.64 ml/m LA Vol (A4C):   86.1 ml 40.34 ml/m LA Biplane Vol: 82.7 ml 38.75 ml/m AORTIC VALVE LVOT Vmax:   72.20 cm/s LVOT Vmean:  54.400 cm/s LVOT VTI:    0.147 m  AORTA Ao Root diam: 3.40 cm Ao Asc diam:  4.00 cm  MITRAL VALVE                TRICUSPID VALVE MV Area (PHT): 3.25 cm     TR Peak grad:   24.8 mmHg MV Decel Time: 234 msec     TR Vmax:        249.00 cm/s MV E velocity: 117.00 cm/s SHUNTS Systemic VTI:  0.15 m Systemic Diam: 2.50 cm  Olga Millers MD Electronically signed  by Olga Millers MD Signature Date/Time: 06/04/2022/2:38:17 PM    Final   TEE  ECHO INTRAOPERATIVE TEE 04/02/2019  Narrative *INTRAOPERATIVE TRANSESOPHAGEAL REPORT *    Patient Name:   Jake Dunn Date of Exam: 04/02/2019 Medical Rec #:  [id number]      Height:       67.0 in Accession #:    [id number]     Weight:       226.2 lb Date of Birth:  19-Aug-1940      BSA:          2.13 m Patient Age:    78 years       BP:           129/91 mmHg Patient Gender: M              HR:           68 bpm. Exam Location:  Anesthesiology  Transesophogeal exam was perform intraoperatively during surgical procedure. Patient was closely monitored under general anesthesia during the  entirety of examination.  Indications:     CAD Sonographer:     Irving Burton Senior RDCS Performing Phys: Karna Christmas MD Diagnosing Phys: Karna Christmas MD  Complications: No known complications during this procedure. POST-OP IMPRESSIONS - Left Ventricle: The left ventricle is unchanged from pre-bypass. - Aorta: The aorta appears unchanged from pre-bypass. - Left Atrial Appendage: The left atrial appendage appears unchanged from pre-bypass. - Aortic Valve: The aortic valve appears unchanged from pre-bypass. - Mitral Valve: There is trivial regurgitation. - Tricuspid Valve: The tricuspid valve appears unchanged from pre-bypass. - Interatrial Septum: The interatrial septum appears unchanged from pre-bypass. - Pericardium: The pericardium appears unchanged from pre-bypass.  PRE-OP FINDINGS Left Ventricle: The left ventricle has normal systolic function, with an ejection fraction of 60-65%. The cavity size was normal. There is mildly increased left ventricular wall thickness.  Right Ventricle: The right ventricle has normal systolic function. The cavity was normal. There is no increase in right ventricular wall thickness.  Left Atrium: Left atrial size was mildly dilated.  Right Atrium: Right atrial size was normal in size.  Interatrial Septum: No atrial level shunt detected by color flow Doppler.  Pericardium: There is no evidence of pericardial effusion.  Mitral Valve: The mitral valve is normal in structure. Mitral valve regurgitation is not visualized by color flow Doppler.  Tricuspid Valve: The tricuspid valve was normal in structure. Tricuspid valve regurgitation was not visualized by color flow Doppler.  Aortic Valve: The aortic valve is normal in structure. There is mild thickening of the aortic valve and there is mild calcification of the aortic valve. Aortic valve regurgitation was not visualized by color flow Doppler.  Pulmonic Valve: The pulmonic valve was normal in  structure. Pulmonic valve regurgitation is not visualized by color flow Doppler.   Aorta: The aortic root, ascending aorta and aortic arch are normal in size and structure. There is evidence of plaque in the descending aorta.  +--------------+-------++ LEFT VENTRICLE        +--------------+-------++ PLAX 2D               +--------------+-------++ LVIDd:        3.63 cm +--------------+-------++ LVIDs:        2.36 cm +--------------+-------++ LV PW:        1.38 cm +--------------+-------++ LV IVS:       1.25 cm +--------------+-------++ LV SV:        36 ml   +--------------+-------++ LV SV Index:  16.13   +--------------+-------++                       +--------------+-------++   Karna Christmas MD Electronically signed by Karna Christmas MD Signature Date/Time: 04/02/2019/2:25:22 PM    Final   MONITORS  CARDIAC EVENT MONITOR 12/06/2022  Narrative   Normal sinus rhythm   4 runs of NSVT 6-7 beats          Recent Labs: 06/05/2022: TSH 1.745 10/27/2022: BNP 149.9 03/25/2023: ALT 10; BUN 15; Creatinine, Ser 1.25; Hemoglobin 12.5; Platelets 190; Potassium 3.8; Sodium 138  Recent Lipid Panel    Component Value Date/Time   CHOL 120 11/21/2019 0953   TRIG 112 11/21/2019 0953   HDL 44 11/21/2019 0953   CHOLHDL 2.7 11/21/2019 0953   CHOLHDL 3.4 03/30/2019 0212   VLDL 35 03/30/2019 0212   LDLCALC 56 11/21/2019 0953    History of Present Illness    82 year old male with the above past medical history including CAD s/p CABG x 4 (LIMA-LAD, SVG-PLD, SVG-Ramus, SVG-D2) in 2020, paroxysmal atrial fibrillation, chronic diastolic heart failure, near syncope, chronic shortness of breath, hypertension, hyperlipidemia, CVA, neuroendocrine tumor, renal cell CA s/p L nephrectomy and cervical/lumbar spine disease.   Cardiac catheterization in 2014 showed nonobstructive CAD with 70% lesion in the third diagonal that was managed medically.  Subsequent  cardiac catheterization in 2018 revealed two-vessel CAD with moderate calcified stenosis in the mid LAD, second diagonal, ramus intermedius, ostial and proximal left circumflex and severe stenosis of the second PLA branch and RCA, normal LVEDP and normal LVEF.  Ongoing medical therapy was recommended echocardiogram at the time showed EF 55 to 60%, G2 DD, severely dilated left atrium, PA peak pressure 30 mmHg.  Lexiscan Myoview on 08/26/2016 was low risk.  He was evaluated in November 2020 with symptoms of chest pain and dyspnea.  He underwent cardiac catheterization and subsequently CABG x 4 in 03/29/2019 with LIMA-LAD, SVG-PLV, SVG-ramus, SVG-D2.  His postop course was complicated by atrial fibrillation. He was started on amiodarone and Eliquis, however, these were subsequently discontinued.  He was hospitalized in August 2022 in the setting of recurrent chest pain he underwent repeat cardiac catheterization. Echo at the time was unremarkable.  He was hospitalized in January 2024 in the setting of chest pain.  He was noted to be in rate controlled atrial fibrillation.  He again underwent cardiac catheterization which showed patent grafts, no clear culprit for chest pain.  Echocardiogram showed EF 50 to 55%, mild MR.  He was started on Ranexa.  He converted to sinus rhythm and was started on Eliquis. CT of the chest showed a 4.5 cm thoracic aortic aneurysm.  There were 2 large hypervascular masses in the abdomen/pancreas concerning for cancer. Subsequent evaluation was consistent with a neuroendocrine tumor. He is following with oncology.  He was evaluated in June 2024 in the setting of near syncope. Cardiac event monitor showed brief episodes of NSVT, no evidence of atrial fibrillation or other significant arrhythmia.  He noted associated nausea and diaphoresis.  He was last seen in office on 02/08/2023 and was stable from a cardiac standpoint.  He did note ongoing shortness of breath, generalized fatigue.  He was  evaluated in the ED on 03/25/2023 following a near syncopal episode while receiving an infusion at the oncology center.  He experienced nausea and vomiting prior to his near syncopal episode..  BP was low at the time.   He presents today for follow-up.  Since his last visit he has    1. CAD: S/p CABG x 4 (LIMA-LAD, SVG-PLD, SVG-Ramus, SVG-D2) in 2020. Most recent cath in 05/2022 showed patent grafts, no clear culprit for chest pain.  He notes ongoing shortness of breath as below, generalized fatigue.  Workup to date overall reassuring including most recent cath, echo as below, cardiac event monitor, and CT chest. He thinks his symptoms may be related to his cancer treatment.  Dr. Swaziland did not recommend any additional cardiac testing. No ASA in the setting of Eliquis.  Continue metoprolol, Imdur, Ranexa, Lipitor.  Plan for close follow-up.   2. Paroxysmal atrial fibrillation: Occurred post CABG with recurrence in January 2024.  Maintaining sinus rhythm in office today.  He notes rare fleeting palpitations.  Continue metoprolol, Eliquis.   3. Chronic diastolic heart failure/shortness of breath: Echo in 05/2022 showed EF 50 to 55%, mild MR. He notes ongoing shortness of breath at rest and with activity, no clear etiology (cath, echo, cardiac monitor without clear cause).  Recent chest x-ray overall unremarkable. PFTs in 2022 were fairly benign.  CT of the chest in 12/2022 showed evidence of emphysema, mild underlying pulmonary fibrosis.  He is on Trelegy. He has follow-up scheduled with pulmonology.  Recent labs showed no evidence of anemia.  Euvolemic and well compensated on exam.  Continue prn Lasix.   4. Near syncope: Likely occurred in the setting of hypotension.  Cardiac event monitor showed brief episodes of NSVT, no evidence of atrial fibrillation or other significant arrhythmia. Recent cath and echo reassuring. No recurrence.     5.  Dilation of ascending aorta: CT chest in 05/2022 revealed dilation  of the ascending aorta measuring 4.5 cm.  CT of the chest in 12/2022 did not mention aortic dilation.  Consider repeat CT chest/aorta at follow-up.   6. Hypertension: BP well controlled. Continue current antihypertensive regimen.    7. Hyperlipidemia: LDL was 50 in 12/2022.  Continue Lipitor.   8. History of CVA: No recurrence. Continue Eliquis.    9. Neuroendocrine tumor: Following with oncology.    10. Disposition: Follow-up in  Home Medications    Current Outpatient Medications  Medication Sig Dispense Refill   acyclovir (ZOVIRAX) 400 MG tablet Take 400 mg by mouth 3 (three) times daily as needed (For cold sores). . (Patient not taking: Reported on 02/23/2023)  5   albuterol (VENTOLIN HFA) 108 (90 Base) MCG/ACT inhaler 2 puffs every 6 (six) hours as needed.     allopurinol (ZYLOPRIM) 300 MG tablet Take 300 mg by mouth daily.     apixaban (ELIQUIS) 5 MG TABS tablet TAKE 1 TABLET BY MOUTH TWICE A DAY 60 tablet 5   aspirin EC 81 MG tablet Take 81 mg by mouth daily.     atorvastatin (LIPITOR) 80 MG tablet Take 1 tablet (80 mg total) by mouth daily. 90 tablet 3   furosemide (LASIX) 20 MG tablet Take 1 tablet (20 mg total) by mouth daily as needed (weight gain of 3 pounds in one day or 5 pounds in a week). (Patient not taking: Reported on 02/23/2023) 90 tablet 1   HYDROcodone-acetaminophen (NORCO/VICODIN) 5-325 MG tablet Take 1 tablet by mouth 3 (three) times daily as needed for moderate pain. 30 tablet 0   isosorbide mononitrate (IMDUR) 60 MG 24 hr tablet Take 1 tablet (60 mg total) by mouth daily. 90 tablet 3   LORazepam (ATIVAN) 0.5 MG tablet Take 1 tablet (0.5 mg total) by mouth 2 (two) times daily  as needed for anxiety. 30 tablet 2   metoprolol tartrate (LOPRESSOR) 25 MG tablet Take 25 mg by mouth 2 (two) times daily.     nitroGLYCERIN (NITROSTAT) 0.4 MG SL tablet Place 1 tablet (0.4 mg total) under the tongue every 5 (five) minutes as needed for chest pain. (Patient not taking: Reported  on 08/09/2022) 90 tablet 3   ondansetron (ZOFRAN) 8 MG tablet TAKE 1 TABLET BY MOUTH EVERY 8 HOURS AS NEEDED FOR NAUSEA AND/OR VOMITING 30 tablet 3   pantoprazole (PROTONIX) 40 MG tablet Take 40 mg by mouth every evening.      prochlorperazine (COMPAZINE) 10 MG tablet TAKE 1 TABLET BY MOUTH EVERY 6 HOURS AS NEEDED 60 tablet 1   prochlorperazine (COMPAZINE) 25 MG suppository Place 1 suppository (25 mg total) rectally every 12 (twelve) hours as needed for nausea or vomiting. 12 suppository 1   ranolazine (RANEXA) 1000 MG SR tablet TAKE 1 TABLET BY MOUTH TWO TIMES A DAY 180 tablet 3   sertraline (ZOLOFT) 50 MG tablet Take 50 mg by mouth daily.     TRELEGY ELLIPTA 100-62.5-25 MCG/ACT AEPB Inhale 1 puff into the lungs daily.     No current facility-administered medications for this visit.     Review of Systems    ***.  All other systems reviewed and are otherwise negative except as noted above.    Physical Exam    VS:  There were no vitals taken for this visit. , BMI There is no height or weight on file to calculate BMI.     GEN: Well nourished, well developed, in no acute distress. HEENT: normal. Neck: Supple, no JVD, carotid bruits, or masses. Cardiac: RRR, no murmurs, rubs, or gallops. No clubbing, cyanosis, edema.  Radials/DP/PT 2+ and equal bilaterally.  Respiratory:  Respirations regular and unlabored, clear to auscultation bilaterally. GI: Soft, nontender, nondistended, BS + x 4. MS: no deformity or atrophy. Skin: warm and dry, no rash. Neuro:  Strength and sensation are intact. Psych: Normal affect.  Accessory Clinical Findings    ECG personally reviewed by me today -    - no acute changes.   Lab Results  Component Value Date   WBC 7.6 03/25/2023   HGB 12.5 (L) 03/25/2023   HCT 38.1 (L) 03/25/2023   MCV 97.2 03/25/2023   PLT 190 03/25/2023   Lab Results  Component Value Date   CREATININE 1.25 (H) 03/25/2023   BUN 15 03/25/2023   NA 138 03/25/2023   K 3.8 03/25/2023    CL 108 03/25/2023   CO2 21 (L) 03/25/2023   Lab Results  Component Value Date   ALT 10 03/25/2023   AST 10 (L) 03/25/2023   ALKPHOS 66 03/25/2023   BILITOT 1.2 (H) 03/25/2023   Lab Results  Component Value Date   CHOL 120 11/21/2019   HDL 44 11/21/2019   LDLCALC 56 11/21/2019   TRIG 112 11/21/2019   CHOLHDL 2.7 11/21/2019    Lab Results  Component Value Date   HGBA1C 5.8 (H) 03/30/2019    Assessment & Plan    1.  ***  No BP recorded.  {Refresh Note OR Click here to enter BP  :1}***   Joylene Grapes, NP 04/10/2023, 4:59 AM

## 2023-04-20 ENCOUNTER — Inpatient Hospital Stay: Payer: Medicare Other

## 2023-04-20 ENCOUNTER — Inpatient Hospital Stay: Payer: Medicare Other | Attending: Nurse Practitioner | Admitting: Oncology

## 2023-04-20 VITALS — BP 138/86 | HR 78 | Temp 98.1°F | Resp 18 | Ht 67.0 in | Wt 224.5 lb

## 2023-04-20 DIAGNOSIS — Z85528 Personal history of other malignant neoplasm of kidney: Secondary | ICD-10-CM | POA: Insufficient documentation

## 2023-04-20 DIAGNOSIS — C7A098 Malignant carcinoid tumors of other sites: Secondary | ICD-10-CM | POA: Insufficient documentation

## 2023-04-20 DIAGNOSIS — Z800 Family history of malignant neoplasm of digestive organs: Secondary | ICD-10-CM | POA: Diagnosis not present

## 2023-04-20 DIAGNOSIS — Z905 Acquired absence of kidney: Secondary | ICD-10-CM | POA: Insufficient documentation

## 2023-04-20 DIAGNOSIS — Z8673 Personal history of transient ischemic attack (TIA), and cerebral infarction without residual deficits: Secondary | ICD-10-CM | POA: Insufficient documentation

## 2023-04-20 DIAGNOSIS — R55 Syncope and collapse: Secondary | ICD-10-CM | POA: Diagnosis not present

## 2023-04-20 DIAGNOSIS — C7A8 Other malignant neuroendocrine tumors: Secondary | ICD-10-CM | POA: Diagnosis not present

## 2023-04-20 DIAGNOSIS — I4891 Unspecified atrial fibrillation: Secondary | ICD-10-CM | POA: Insufficient documentation

## 2023-04-20 DIAGNOSIS — R110 Nausea: Secondary | ICD-10-CM | POA: Diagnosis not present

## 2023-04-20 LAB — CMP (CANCER CENTER ONLY)
ALT: 7 U/L (ref 0–44)
AST: 7 U/L — ABNORMAL LOW (ref 15–41)
Albumin: 4 g/dL (ref 3.5–5.0)
Alkaline Phosphatase: 80 U/L (ref 38–126)
Anion gap: 8 (ref 5–15)
BUN: 17 mg/dL (ref 8–23)
CO2: 23 mmol/L (ref 22–32)
Calcium: 8.9 mg/dL (ref 8.9–10.3)
Chloride: 105 mmol/L (ref 98–111)
Creatinine: 1.43 mg/dL — ABNORMAL HIGH (ref 0.61–1.24)
GFR, Estimated: 50 mL/min — ABNORMAL LOW (ref >60)
Glucose, Bld: 207 mg/dL — ABNORMAL HIGH (ref 70–99)
Potassium: 4.3 mmol/L (ref 3.5–5.1)
Sodium: 136 mmol/L (ref 135–145)
Total Bilirubin: 0.8 mg/dL (ref <1.2)
Total Protein: 6.7 g/dL (ref 6.5–8.1)

## 2023-04-20 LAB — CBC WITH DIFFERENTIAL (CANCER CENTER ONLY)
Abs Immature Granulocytes: 0.04 10*3/uL (ref 0.00–0.07)
Basophils Absolute: 0.1 10*3/uL (ref 0.0–0.1)
Basophils Relative: 1 %
Eosinophils Absolute: 0.4 10*3/uL (ref 0.0–0.5)
Eosinophils Relative: 5 %
HCT: 41.8 % (ref 39.0–52.0)
Hemoglobin: 13.6 g/dL (ref 13.0–17.0)
Immature Granulocytes: 1 %
Lymphocytes Relative: 21 %
Lymphs Abs: 1.6 10*3/uL (ref 0.7–4.0)
MCH: 32.2 pg (ref 26.0–34.0)
MCHC: 32.5 g/dL (ref 30.0–36.0)
MCV: 98.8 fL (ref 80.0–100.0)
Monocytes Absolute: 0.9 10*3/uL (ref 0.1–1.0)
Monocytes Relative: 11 %
Neutro Abs: 4.9 10*3/uL (ref 1.7–7.7)
Neutrophils Relative %: 61 %
Platelet Count: 221 10*3/uL (ref 150–400)
RBC: 4.23 MIL/uL (ref 4.22–5.81)
RDW: 13.8 % (ref 11.5–15.5)
WBC Count: 8 10*3/uL (ref 4.0–10.5)
nRBC: 0 % (ref 0.0–0.2)

## 2023-04-20 NOTE — Progress Notes (Signed)
[facility] OFFICE PROGRESS NOTE   Diagnosis: Pancreas neuroendocrine tumor  INTERVAL HISTORY:   Jake Dunn returns as scheduled.  He continues monthly Sandostatin.  No flushing or diarrhea.  He has occasional nausea.  No consistent abdominal pain. He developed sweats and nausea while eating a breakfast at a restaurant 03/25/2023.  This was followed by syncope event.  He was evaluated in the emergency room.  Head CT revealed no acute abnormality.  He reports it was recommended he follow-up with his cardiologist. Jake Dunn reports a syncope event while in bed earlier this week.  He reports passing out when turning his head.  No vertigo symptoms.  Objective:  Vital signs in last 24 hours:  Blood pressure 138/86, pulse 78, temperature 98.1 F (36.7 C), temperature source Temporal, resp. rate 18, height 5\' 7"  (1.702 m), weight 224 lb 8 oz (101.8 kg), SpO2 99%.  Resp: Lungs clear bilaterally Cardio: Regular rate and rhythm, distant heart sounds GI: Nontender, no mass, no hepatosplenomegaly Vascular: No leg edema   Lab Results:  Lab Results  Component Value Date   WBC 8.0 04/20/2023   HGB 13.6 04/20/2023   HCT 41.8 04/20/2023   MCV 98.8 04/20/2023   PLT 221 04/20/2023   NEUTROABS 4.9 04/20/2023    CMP  Lab Results  Component Value Date   NA 138 03/25/2023   K 3.8 03/25/2023   CL 108 03/25/2023   CO2 21 (L) 03/25/2023   GLUCOSE 135 (H) 03/25/2023   BUN 15 03/25/2023   CREATININE 1.25 (H) 03/25/2023   CALCIUM 8.6 (L) 03/25/2023   PROT 6.1 (L) 03/25/2023   ALBUMIN 3.4 (L) 03/25/2023   AST 10 (L) 03/25/2023   ALT 10 03/25/2023   ALKPHOS 66 03/25/2023   BILITOT 1.2 (H) 03/25/2023   GFRNONAA 58 (L) 03/25/2023   GFRAA 66 11/21/2019    Lab Results  Component Value Date   VWU981 10 06/16/2022     Medications: I have reviewed the patient's current medications.   Assessment/Plan: Pancreas masses CT chest 06/05/2022-10 mm right hilar node, 9 mm  central right upper lobe nodule, 2 contiguous lobulated masses in the central abdomen, left nephrectomy MRI abdomen 06/06/2022-2 adjacent hypervascular masses in the central pancreas with associated ductal dilatation and atrophy, probable prominent lymph node superior to the right mass, the right-sided mass causes mass effect at the celiac axis and portal vein Upper EUS 06/13/2022-mass identified in the pancreatic body, FNA performed, vascular involvement suspected.  Second similar smaller mass seen immediately adjacent to the larger mass.  Cytology-malignant cells present, solid pseudopapillary neoplasm favored PET scan 07/04/2022-mild increased FDG uptake associated with the dominant mass centered around the pancreatic neck.  Also mild increased uptake associated with the mass within the tail of the pancreas.  Solid-appearing lesion or lymph node within the gastrohepatic ligament which directly abuts the superior margin of the dominant pancreatic mass, also exhibits mild increased FDG uptake.  Mild FDG uptake associated with a right upper lobe lung nodule EUS 07/27/2022-"Twin" masses identified in the pancreas body, no pathology in the left lobe of the liver or gallbladder, no adenopathy, FNA biopsy-"atypical cells present "-could not be further characterize due to lack of cellularity on cell block Dotatate PET 07/28/2022-moderate radiotracer activity associated with pancreatic lesions including a small pancreas tail nodule consistent with well-differentiated versus moderately differentiated neuroendocrine tumor, no metastatic adenopathy or distant metastatic disease Chromogranin A elevated on 08/15/2022 Monthly Sandostatin initiated 08/24/2022 CTs 12/26/2022-unchanged pancreas masses, no evidence of metastatic  disease in the chest abdomen or pelvis, unchanged right upper lobe nodule suspicious for a primary lung tumor 2.   Remote history of renal cell carcinoma, status post a left nephrectomy 03/16/1994 status  post left nephrectomy, renal cell carcinoma, pT2, pNX, pMX, tumor characterized by sheets and clusters of neoplastic clear to granular cells, consistent with a renal cell carcinoma grade 2, no definite diagnostic vascular invasion of larger vessels, tumor does not appear to extend through the renal capsule. 3.   Atrial fibrillation 4.   Coronary artery disease, coronary artery bypass surgery in 2020 5.   History of CVA 6.   Multiple spine surgeries 7.   Left upper lobe nodule and borderline enlarged right hilar node on chest CT 06/05/2022 8.   Gout 9.   History of adenomatous polyps 10.  Family history of pancreas cancer 11.  Right upper lobe nodule with mild FDG uptake on PET 07/04/2022 Unchanged right upper lobe nodule on chest CT 12/26/2022  Disposition: Jake Dunn is maintained on monthly Sandostatin for treatment of a pancreas neuroendocrine tumor.  He does not appear to have symptoms related to the neuroendocrine tumor at present.  He reports 2 recent syncope events of unclear etiology.  He reports he is scheduled to see cardiology within the next week.  Jake Dunn requested we hold Sandostatin today.  I doubt the Sandostatin is responsible for the syncope events.  He will return for the next dose of Sandostatin 05/10/2022.  He will be scheduled for an office and lab visit on 06/07/2022.  He will be scheduled for a restaging CT in 3-4 months.  Thornton Papas, MD  04/20/2023  10:17 AM

## 2023-05-11 ENCOUNTER — Inpatient Hospital Stay: Payer: Medicare Other | Attending: Nurse Practitioner

## 2023-05-11 VITALS — BP 141/76 | HR 75 | Temp 97.6°F | Resp 17 | Ht 67.0 in | Wt 226.2 lb

## 2023-05-11 DIAGNOSIS — I2510 Atherosclerotic heart disease of native coronary artery without angina pectoris: Secondary | ICD-10-CM | POA: Diagnosis not present

## 2023-05-11 DIAGNOSIS — R0609 Other forms of dyspnea: Secondary | ICD-10-CM | POA: Diagnosis not present

## 2023-05-11 DIAGNOSIS — R112 Nausea with vomiting, unspecified: Secondary | ICD-10-CM | POA: Insufficient documentation

## 2023-05-11 DIAGNOSIS — C7A098 Malignant carcinoid tumors of other sites: Secondary | ICD-10-CM | POA: Diagnosis not present

## 2023-05-11 DIAGNOSIS — Z85528 Personal history of other malignant neoplasm of kidney: Secondary | ICD-10-CM | POA: Diagnosis not present

## 2023-05-11 DIAGNOSIS — C7A8 Other malignant neuroendocrine tumors: Secondary | ICD-10-CM

## 2023-05-11 DIAGNOSIS — I4891 Unspecified atrial fibrillation: Secondary | ICD-10-CM | POA: Diagnosis not present

## 2023-05-11 MED ORDER — OCTREOTIDE ACETATE 20 MG IM KIT
20.0000 mg | PACK | Freq: Once | INTRAMUSCULAR | Status: AC
  Administered 2023-05-11: 20 mg via INTRAMUSCULAR

## 2023-05-11 NOTE — Patient Instructions (Signed)
 [facility] - A DEPT OF [facility]   Discharge Instructions: Thank you for choosing [facility] to provide your oncology and hematology care.   If you have a lab appointment with the Cancer Center, please go directly to the Cancer Center and check in at the registration area.   Wear comfortable clothing and clothing appropriate for easy access to any Portacath or PICC line.   We strive to give you quality time with your provider. You may need to reschedule your appointment if you arrive late (15 or more minutes).  Arriving late affects you and other patients whose appointments are after yours.  Also, if you miss three or more appointments without notifying the office, you may be dismissed from the clinic at the provider's discretion.      For prescription refill requests, have your pharmacy contact our office and allow 72 hours for refills to be completed.    Today you received the following chemotherapy and/or immunotherapy agents Sandostatin .      To help prevent nausea and vomiting after your treatment, we encourage you to take your nausea medication as directed.  BELOW ARE SYMPTOMS THAT SHOULD BE REPORTED IMMEDIATELY: *FEVER GREATER THAN 100.4 F (38 C) OR HIGHER *CHILLS OR SWEATING *NAUSEA AND VOMITING THAT IS NOT CONTROLLED WITH YOUR NAUSEA MEDICATION *UNUSUAL SHORTNESS OF BREATH *UNUSUAL BRUISING OR BLEEDING *URINARY PROBLEMS (pain or burning when urinating, or frequent urination) *BOWEL PROBLEMS (unusual diarrhea, constipation, pain near the anus) TENDERNESS IN MOUTH AND THROAT WITH OR WITHOUT PRESENCE OF ULCERS (sore throat, sores in mouth, or a toothache) UNUSUAL RASH, SWELLING OR PAIN  UNUSUAL VAGINAL DISCHARGE OR ITCHING   Items with * indicate a potential emergency and should be followed up as soon as possible or go to the Emergency Department if any problems should occur.  Please show the CHEMOTHERAPY ALERT CARD or  IMMUNOTHERAPY ALERT CARD at check-in to the Emergency Department and triage nurse.  Should you have questions after your visit or need to cancel or reschedule your appointment, please contact [facility] - A DEPT OF [facility]  Dept: [phone]  and follow the prompts.  Office hours are 8:00 a.m. to 4:30 p.m. Monday - Friday. Please note that voicemails left after 4:00 p.m. may not be returned until the following business day.  We are closed weekends and major holidays. You have access to a nurse at all times for urgent questions. Please call the main number to the clinic Dept: [phone] and follow the prompts.   For any non-urgent questions, you may also contact your provider using MyChart. We now offer e-Visits for anyone 3 and older to request care online for non-urgent symptoms. For details visit [email or web address].   Also download the MyChart app! Go to the app store, search MyChart, open the app, select [place], and log in with your MyChart username and password.  Octreotide  Injection Solution What is this medication? OCTREOTIDE  (ok TREE oh tide) treats high levels of growth hormone (acromegaly). It works by reducing the amount of growth hormone your body makes. This reduces symptoms and the risk of health problems caused by too much growth hormone, such as diabetes and heart disease. It may also be used to treat diarrhea caused by neuroendocrine tumors. It works by slowing down the release of serotonin from the tumor cells. This reduces the number of bowel movements you have. This medicine may be used for other purposes; ask  your health care provider or pharmacist if you have questions. COMMON BRAND NAME(S): Bynfezia , Sandostatin  What should I tell my care team before I take this medication? They need to know if you have any of these conditions: Diabetes Gallbladder disease Kidney disease Liver disease Thyroid  disease An unusual or  allergic reaction to octreotide , other medications, foods, dyes, or preservatives Pregnant or trying to get pregnant Breast-feeding How should I use this medication? This medication is injected under the skin or into a vein. It is usually given by your care team in a hospital or clinic setting. If you get this medication at home, you will be taught how to prepare and give it. Use exactly as directed. Take it as directed on the prescription label at the same time every day. Keep taking it unless your care team tells you to stop. Allow the injection solution to come to room temperature before use. Do not warm it artificially. It is important that you put your used needles and syringes in a special sharps container. Do not put them in a trash can. If you do not have a sharps container, call your pharmacist or care team to get one. Talk to your care team about the use of this medication in children. Special care may be needed. Overdosage: If you think you have taken too much of this medicine contact a poison control center or emergency room at once. NOTE: This medicine is only for you. Do not share this medicine with others. What if I miss a dose? If you miss a dose, take it as soon as you can. If it is almost time for your next dose, take only that dose. Do not take double or extra doses. What may interact with this medication? Bromocriptine Certain medications for blood pressure, heart disease, irregular heartbeat Cyclosporine Diuretics Medications for diabetes, including insulin  Quinidine This list may not describe all possible interactions. Give your health care provider a list of all the medicines, herbs, non-prescription drugs, or dietary supplements you use. Also tell them if you smoke, drink alcohol , or use illegal drugs. Some items may interact with your medicine. What should I watch for while using this medication? Visit your care team for regular checks on your progress. Tell your care  team if your symptoms do not start to get better or if they get worse. To help reduce irritation at the injection site, use a different site for each injection and make sure the solution is at room temperature before use. This medication may cause decreases in blood sugar. Signs of low blood sugar include chills, cool, pale skin or cold sweats, drowsiness, extreme hunger, fast heartbeat, headache, nausea, nervousness or anxiety, shakiness, trembling, unsteadiness, tiredness, or weakness. Contact your care team right away if you experience any of these symptoms. This medication may increase blood sugar. The risk may be higher in patients who already have diabetes. Ask your care team what you can do to lower your risk of diabetes while taking this medication. You should make sure you get enough vitamin B12 while you are taking this medication. Discuss the foods you eat and the vitamins you take with your care team. What side effects may I notice from receiving this medication? Side effects that you should report to your care team as soon as possible: Allergic reactions--skin rash, itching, hives, swelling of the face, lips, tongue, or throat Gallbladder problems--severe stomach pain, nausea, vomiting, fever Heart rhythm changes--fast or irregular heartbeat, dizziness, feeling faint or lightheaded, chest pain, trouble  breathing High blood sugar (hyperglycemia)--increased thirst or amount of urine, unusual weakness or fatigue, blurry vision Low blood sugar (hypoglycemia)--tremors or shaking, anxiety, sweating, cold or clammy skin, confusion, dizziness, rapid heartbeat Low thyroid  levels (hypothyroidism)--unusual weakness or fatigue, increased sensitivity to cold, constipation, hair loss, dry skin, weight gain, feelings of depression Low vitamin B12 level--pain, tingling, or numbness in the hands or feet, muscle weakness, dizziness, confusion, trouble concentrating Pancreatitis--severe stomach pain that  spreads to your back or gets worse after eating or when touched, fever, nausea, vomiting Side effects that usually do not require medical attention (report to your care team if they continue or are bothersome): Diarrhea Dizziness Gas Headache Pain, redness, or irritation at injection site Stomach pain This list may not describe all possible side effects. Call your doctor for medical advice about side effects. You may report side effects to FDA at 1-800-FDA-1088. Where should I keep my medication? Keep out of the reach of children and pets. Store in the refrigerator. Protect from light. Allow to come to room temperature naturally. Do not use artificial heat. If protected from light, the injection may be stored between 20 and 30 degrees C (70 and 86 degrees F) for 14 days. After the initial use, throw away any unused portion of a multiple dose vial after 14 days. Get rid of any unused portions of the ampules after use. To get rid of medications that are no longer needed or have expired: Take the medication to a medication take-back program. Ask your pharmacy or law enforcement to find a location. If you cannot return the medication, ask your pharmacist or care team how to get rid of the medication safely. NOTE: This sheet is a summary. It may not cover all possible information. If you have questions about this medicine, talk to your doctor, pharmacist, or health care provider.  2024 Elsevier/Gold Standard (2021-07-29 00:00:00)

## 2023-05-21 DIAGNOSIS — B349 Viral infection, unspecified: Secondary | ICD-10-CM | POA: Diagnosis not present

## 2023-05-26 NOTE — Progress Notes (Unsigned)
Cardiology Office Note   Date:  05/30/2023   ID:  Devlin, Alkire 09/21/1940, MRN [id number]  PCP:  Laurann Montana, MD  Cardiologist:   Naiomy Watters [place], MD   Chief Complaint  Patient presents with   Shortness of Breath    Patient stated that he has SOB and Black out spells he had an episode 05/29/2023 he layed down on the couch and everything started spinning 05/23/2023 he passed completley out and he was carried to the hospital [place]       History of Present Illness: Jake Dunn is a 83 y.o. male who is seen for follow up CAD s/p CABG. He has a  PMH of CAD, GERD, HTN, HLD, renal cell CA s/p left nephrectomy. Cardiac catheterization in 2014 showed nonobstructive CAD with 70% lesion in third diagonal that was treated medically.    He presented with left sided chest pressure on 08/15/2016. He underwent cardiac catheterization on 08/16/2016, this revealed two-vessel CAD with moderate calcified stenosis in the mid LAD, second diagonal, ramus intermedius, ostial and proximal left circumflex and a severe stenosis of the second PLA branch of the RCA. Overall normal LVEDP and normal LVEF. Recommended  medical therapy and outpatient exercise stress Myoview. While the LAD and the diagonal only showed moderate progression compared to the 2010/2014 studies, the left circumflex and intermediate stenosis had progressed. Echocardiogram obtained on 08/17/2016 showed EF 55-60%, grade 2 diastolic dysfunction, severely dilated left atrium, PA peak pressure 30 mmHg. Lexiscan Myoview obtained on 08/23/2016 showed EF 51%, no perfusion defect at stress, overall low risk study.     He was seen in November 2020 with symptoms of chest pain and dyspnea. This led to a cardiac cath showing 3 vessel obstructive CAD. He underwent CABG on 04/02/19 by Dr Cliffton Asters. CABG X 4.  LIMA LAD, RSVG PLV, Ramus, and D2  (T graft off the ramus vein graft). Post op course complicated by Afib. He was loaded with amiodarone. On his   visit in January 2021 amiodarone and Eliquis were discontinued.   He did have a sleep study showing few hyponeic episodes without frank apnea. Did not meet criteria for CPAP trial.   He was admitted in August 2022 with recurrent chest pain. Troponins were normal. He underwent cardiac cath which showed good perfusion and no culprit lesion. Subsequent Echo was unremarkable.   He was admitted 1/26-1/31/24 after presenting with with waxing/waning chest pain similar to prior angina for several days. Due to persistent symptoms including DOE he came to the ER. Troponins were negative though symptoms concerning for angina so patient admitted to cardiology service for plan for echo and cardiac cath. He was also noted to be in atrial fib, rate controlled. Echo showed EF 50-55% with mild MR. Cardiac cath showed patent grafts with no clear culprit for his chest pain. He was started on Ranexa. He did convert to NSR and was DC on Eliquis. CT of the chest also showed a 4.5 cm thoracic aortic aneurysm. There was 2 large hypervascular masses in the abdomen/pancreas concerning for Cancer. Unable to do needle biopsy so outpatient ERCP recommended. 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 Subsequent evaluation c/w neuroendocrine tumor. Treated with Sandostatin-monthly injections- followed by Dr Truett Perna.   He was seen as a work in in June by Dr Antoine Poche for syncope sitting in a Animal nutritionist. Event monitor placed. He also complained of SOB. BNP level was 150.   States he still has  several spells where he gets acutely nauseated, sweating, lightheaded where he feels like he will pass out. He has passed out on a few occasions.  Heart may race. No chest pain.    Past Medical History:  Diagnosis Date   Adenomatous polyp    Anxiety    Arthritis    "knees, ankles, shoulders" (08/15/2016)   CAD (coronary artery disease)    s/p cath in 2014 showing significant stenosis in the diagonal side  branches and in the RV marginal branch. These vessels were small and diffusely diseased. He is managed medically. 08/16/16 Cath, no disease progression   Chronic lower back pain    Disc disease, degenerative, cervical    GERD (gastroesophageal reflux disease)    Gout    Hyperlipidemia    Hypertension    Osteoarthritis    Renal cell carcinoma    left kidney removal   Stroke [facility])    MINI STROKE 15+ YRS AGO, no residual   Stroke (HCC)    "I've had 2 or 3"; denies residual on 08/15/2016   Syncope and collapse     Past Surgical History:  Procedure Laterality Date   ANTERIOR CERVICAL DECOMP/DISCECTOMY FUSION  02/2004; 04/2005   Hattie Perch 09/20/2010; Hattie Perch 09/20/2010   APPLICATION OF ROBOTIC ASSISTANCE FOR SPINAL PROCEDURE N/A 01/02/2018   Procedure: APPLICATION OF ROBOTIC ASSISTANCE FOR SPINAL PROCEDURE;  Surgeon: Barnett Abu, MD;  Location: [facility] OR;  Service: Neurosurgery;  Laterality: N/A;   BACK SURGERY     CARDIAC CATHETERIZATION  12/14/2008   ef 50-55%. SHOWED SIGNIFICANT STENOSIS AND TO DIAGONAL SIDE BRANCHES AND IN THE RIGHT VENTRICULAR MARGINAL BRANCH. THESE VESSELS WERE SMALL AND DIFFUSELY DISEASED   CARDIOVASCULAR STRESS TEST  12/10/2009   EF 61%. EKG negative. Mild peri ischemia. Managed medically.    CARPAL TUNNEL RELEASE Right 11/2005   Hattie Perch 09/20/2010   CARPAL TUNNEL RELEASE Left 06/2007   Hattie Perch 5/132012   COLONOSCOPY  11/2004   Hattie Perch 09/20/2010   COLONOSCOPY W/ BIOPSIES AND POLYPECTOMY  09/2002   Hattie Perch 09/20/2010   CORONARY ARTERY BYPASS GRAFT N/A 04/02/2019   Procedure: CORONARY ARTERY BYPASS GRAFTING (CABG) using LIMA to LAD; Endoscopically harvested right greater saphenous vein to the PLB, Ramus, and Diag 2.;  Surgeon: Corliss Skains, MD;  Location: [facility] OR;  Service: Open Heart Surgery;  Laterality: N/A;   ESOPHAGOGASTRODUODENOSCOPY (EGD) WITH PROPOFOL N/A 06/13/2022   Procedure: ESOPHAGOGASTRODUODENOSCOPY (EGD) WITH PROPOFOL;  Surgeon: Willis Modena, MD;  Location: [facility]  ENDOSCOPY;  Service: Gastroenterology;  Laterality: N/A;   ESOPHAGOGASTRODUODENOSCOPY (EGD) WITH PROPOFOL Bilateral 07/27/2022   Procedure: ESOPHAGOGASTRODUODENOSCOPY (EGD) WITH PROPOFOL;  Surgeon: Willis Modena, MD;  Location: [facility] ENDOSCOPY;  Service: Gastroenterology;  Laterality: Bilateral;   EUS Bilateral 07/27/2022   Procedure: UPPER ENDOSCOPIC ULTRASOUND (EUS) LINEAR;  Surgeon: Willis Modena, MD;  Location: [facility] ENDOSCOPY;  Service: Gastroenterology;  Laterality: Bilateral;   FINE NEEDLE ASPIRATION N/A 06/13/2022   Procedure: FINE NEEDLE ASPIRATION (FNA) LINEAR;  Surgeon: Willis Modena, MD;  Location: [facility] ENDOS[facility];  Service: Gastroenterology;  Laterality: N/A;   FINE NEEDLE ASPIRATION Bilateral 07/27/2022   Procedure: FINE NEEDLE ASPIRATION (FNA) LINEAR;  Surgeon: Willis Modena, MD;  Location: [facility];  Service: Gastroenterology;  Laterality: Bilateral;   INCISION AND DRAINAGE ABSCESS Right 10/18/2016   Procedure: INCISION AND DRAINAGE ABSCESS RIGHT SHOULDER;  Surgeon: Sheral Apley, MD;  Location: [facility];  Service: Orthopedics;  Laterality: Right;   INCISION AND DRAINAGE OF WOUND Right 08/2005   shoulder/notes 09/20/2010  IR FLUORO GUIDE CV LINE LEFT  10/19/2016   IR US GUIDE VASC ACCESS LEFT  10/19/2016   IRRIGATION AND DEBRIDEMENT SHOULDER Right 12/22/2016   Procedure: IRRIGATION AND DEBRIDEMENT RIGHT SHOULDER;  Surgeon: Loreta Ave, MD;  Location: [facility] OR;  Service: Orthopedics;  Laterality: Right;   JOINT REPLACEMENT     LEFT HEART CATH AND CORONARY ANGIOGRAPHY N/A 08/16/2016   Procedure: Left Heart Cath and Coronary Angiography;  Surgeon: Tonny Bollman, MD;  Location: [facility] INVASIVE CV LAB;  Service: Cardiovascular;  Laterality: N/A;   LEFT HEART CATH AND CORONARY ANGIOGRAPHY N/A 03/26/2019   Procedure: LEFT HEART CATH AND CORONARY ANGIOGRAPHY;  Surgeon: [place], Alvilda Mckenna M, MD;  Location: [facility]VASIVE CV LAB;  Service: Cardiovascular;  Laterality: N/A;   LEFT  HEART CATH AND CORS/GRAFTS ANGIOGRAPHY N/A 12/21/2020   Procedure: LEFT HEART CATH AND CORS/GRAFTS ANGIOGRAPHY;  Surgeon: [place], Maurita Havener M, MD;  Location: [facility] INVASIVE CV LAB;  Service: Cardiovascular;  Laterality: N/A;   LEFT HEART CATH AND CORS/GRAFTS ANGIOGRAPHY N/A 06/03/2022   Procedure: LEFT HEART CATH AND CORS/GRAFTS ANGIOGRAPHY;  Surgeon: Tonny Bollman, MD;  Location: [facility] INVASIVE CV LAB;  Service: Cardiovascular;  Laterality: N/A;   LEFT HEART CATHETERIZATION WITH CORONARY ANGIOGRAM N/A 08/09/2012   Procedure: LEFT HEART CATHETERIZATION WITH CORONARY ANGIOGRAM;  Surgeon: Teria Khachatryan M [place], MD;  Location: [facility] CATH LAB;  Service: Cardiovascular;  Laterality: N/A;   LUMBAR FUSION     NEPHRECTOMY Left early 2000s   REPLACEMENT TOTAL KNEE Right 08/2008   Hattie Perch 09/07/2010   SHOULDER ARTHROSCOPY WITH ROTATOR CUFF REPAIR Right 06/2005   Hattie Perch 09/20/2010   TOTAL KNEE ARTHROPLASTY  06/08/2011   Procedure: TOTAL KNEE ARTHROPLASTY;  Surgeon: Loreta Ave, MD;  Location: [facility] OR;  Service: Orthopedics;  Laterality: Left;   TOTAL SHOULDER ARTHROPLASTY Right 04/06/2016   Procedure: RIGHT REVERSE TOTAL SHOULDER ARTHROPLASTY;  Surgeon: Loreta Ave, MD;  Location: [facility] OR;  Service: Orthopedics;  Laterality: Right;   TOTAL SHOULDER REVISION Right 12/22/2016   Procedure: RIGHT SHOULDER GLENOID AND HUMERAL COMPONENT REVISION;  Surgeon: Loreta Ave, MD;  Location: [facility] OR;  Service: Orthopedics;  Laterality: Right;   UPPER ESOPHAGEAL ENDOSCOPIC ULTRASOUND (EUS) Bilateral 06/13/2022   Procedure: UPPER ESOPHAGEAL ENDOSCOPIC ULTRASOUND (EUS);  Surgeon: Willis Modena, MD;  Location: [facility]COPY;  Service: Gastroenterology;  Laterality: Bilateral;   US ECHOCARDIOGRAPHY  12/15/2008   EF 60-65%   US ECHOCARDIOGRAPHY  09/28/2004   EF 55-60%     Current Outpatient Medications  Medication Sig Dispense Refill   albuterol (VENTOLIN HFA) 108 (90 Base) MCG/ACT inhaler 2 puffs every 6 (six) hours as needed.      allopurinol (ZYLOPRIM) 300 MG tablet Take 300 mg by mouth daily.     apixaban (ELIQUIS) 5 MG TABS tablet TAKE 1 TABLET BY MOUTH TWICE A DAY 60 tablet 5   aspirin EC 81 MG tablet Take 81 mg by mouth daily.     Cyanocobalamin 1000 MCG SUBL Take 1,000 mcg by mouth daily.     furosemide (LASIX) 20 MG tablet Take 1 tablet (20 mg total) by mouth daily as needed (weight gain of 3 pounds in one day or 5 pounds in a week). 90 tablet 1   HYDROcodone-acetaminophen (NORCO/VICODIN) 5-325 MG tablet Take 1 tablet by mouth 3 (three) times daily as needed for moderate pain. 30 tablet 0   LORazepam (ATIVAN) 0.5 MG tablet Take 1 tablet (0.5 mg total) by mouth 2 (two) times daily as needed for anxiety. 30  tablet 2   metoprolol tartrate (LOPRESSOR) 25 MG tablet Take 25 mg by mouth 2 (two) times daily.     ondansetron (ZOFRAN) 8 MG tablet TAKE 1 TABLET BY MOUTH EVERY 8 HOURS AS NEEDED FOR NAUSEA AND/OR VOMITING 30 tablet 3   pantoprazole (PROTONIX) 40 MG tablet Take 40 mg by mouth every evening.      prochlorperazine (COMPAZINE) 10 MG tablet TAKE 1 TABLET BY MOUTH EVERY 6 HOURS AS NEEDED 60 tablet 1   prochlorperazine (COMPAZINE) 25 MG suppository Place 1 suppository (25 mg total) rectally every 12 (twelve) hours as needed for nausea or vomiting. 12 suppository 1   ranolazine (RANEXA) 1000 MG SR tablet TAKE 1 TABLET BY MOUTH TWO TIMES A DAY 180 tablet 3   sertraline (ZOLOFT) 50 MG tablet Take 50 mg by mouth daily.     TRELEGY ELLIPTA 100-62.5-25 MCG/ACT AEPB Inhale 1 puff into the lungs daily.     acyclovir (ZOVIRAX) 400 MG tablet Take 400 mg by mouth 3 (three) times daily as needed (For cold sores). . (Patient not taking: Reported on 02/23/2023)  5   atorvastatin (LIPITOR) 80 MG tablet Take 1 tablet (80 mg total) by mouth daily. 90 tablet 3   nitroGLYCERIN (NITROSTAT) 0.4 MG SL tablet Place 1 tablet (0.4 mg total) under the tongue every 5 (five) minutes as needed for chest pain. (Patient not taking: Reported on  08/09/2022) 90 tablet 3   No current facility-administered medications for this visit.    Allergies:   Latex and Tape    Social History:  The patient  reports that he quit smoking about 34 years ago. His smoking use included cigarettes. He has never used smokeless tobacco. He reports current alcohol use. He reports that he does not use drugs.   Family History:  The patient's family history includes Breast cancer in his sister and sister; Cancer in his maternal aunt and maternal uncle; Esophageal cancer in his sister; Heart attack in his father; Heart disease in his brother; Lung cancer in his brother; Pancreatic cancer in his sister; Prostate cancer in his brother; Skin cancer in his sister.    ROS:  Please see the history of present illness.   Otherwise, review of systems are positive for none.   All other systems are reviewed and negative.    PHYSICAL EXAM: VS:  BP (!) 169/93   Pulse 69   Ht 5\' 7"  (1.702 m)   Wt 227 lb (103 kg)   SpO2 98%   BMI 35.55 kg/m  , BMI Body mass index is 35.55 kg/m.  GEN: Well nourished, well developed, in no acute distress  HEENT: normal  Neck: no JVD, carotid bruits, or masses Cardiac: RRR; no murmurs, rubs, or gallops,no edema. Respiratory:  clear to auscultation bilaterally, normal work of breathing GI: soft, nontender, nondistended, + BS MS: no deformity or atrophy  Skin: warm and dry, no rash Neuro:  Strength and sensation are intact Psych: euthymic mood, full affect   EKG:  EKG is not ordered today.   Recent Labs: 06/05/2022: TSH 1.745 10/27/2022: BNP 149.9 04/20/2023: ALT 7; BUN 17; Creatinine 1.43; Hemoglobin 13.6; Platelet Count 221; Potassium 4.3; Sodium 136    Lipid Panel    Component Value Date/Time   CHOL 120 11/21/2019 0953   TRIG 112 11/21/2019 0953   HDL 44 11/21/2019 0953   CHOLHDL 2.7 11/21/2019 0953   CHOLHDL 3.4 03/30/2019 0212   VLDL 35 03/30/2019 0212   LDLCALC 56 11/21/2019 0953  Dated 01/25/19: cholesterol 117,  triglycerides 125, HDL 39, LDL 53. CMET, CBC, TSH normal Dated 04/09/20: cholesterol 123, triglycerides 130, HDL 44, LDL 34. Creatinine 1.17. otherwise CBC, CMET and TSH normal Dated 02/16/21: cholesterol 118, triglycerides 99, HDL 41, LDL 58. Dated 12/02/21: cholesterol 128, triglycerides 195, HDL 39, LDL 57, CBC, CMET and TSH normal Dated 12/16/22: cholesterol 119, triglycerides 162, HDL 41, LDL 50  Wt Readings from Last 3 Encounters:  05/30/23 227 lb (103 kg)  05/11/23 226 lb 3.2 oz (102.6 kg)  04/20/23 224 lb 8 oz (101.8 kg)      Other studies Reviewed: Additional studies/ records that were reviewed today include:   Cath 03/26/19: Procedures  LEFT HEART CATH AND CORONARY ANGIOGRAPHY  Conclusion    Prox RCA lesion is 45% stenosed. 2nd RPL lesion is 80% stenosed. Ost LM to Mid LM lesion is 50% stenosed. Ost Cx to Mid Cx lesion is 80% stenosed. Ramus lesion is 90% stenosed. Mid LAD lesion is 70% stenosed. 2nd Diag lesion is 60% stenosed. The left ventricular systolic function is normal. LV end diastolic pressure is normal. The left ventricular ejection fraction is 55-65% by visual estimate.   1. Three vessel obstructive, Calcific CAD 2. Normal LV function 3. Normal LVEDP   Plan: recommend referral to CT surgery for CABG. Will stop Plavix.     Echo 03/30/19: IMPRESSIONS      1. Left ventricular ejection fraction, by visual estimation, is 55 to 60%. The left ventricle has normal function. There is no left ventricular hypertrophy.  2. Global right ventricle has mildly reduced systolic function.The right ventricular size is mildly enlarged. No increase in right ventricular wall thickness.  3. Left atrial size was normal.  4. Right atrial size was mildly dilated.  5. The mitral valve is normal in structure. No evidence of mitral valve regurgitation.  6. The tricuspid valve is normal in structure. Tricuspid valve regurgitation is trivial.  7. The aortic valve is normal in  structure. Aortic valve regurgitation is not visualized. No evidence of aortic valve sclerosis or stenosis.  8. The pulmonic valve was grossly normal. Pulmonic valve regurgitation is not visualized.  9. Aortic dilatation noted. 10. There is mild dilatation of the ascending aorta measuring 38 mm. 11. Normal pulmonary artery systolic pressure. 12. The atrial septum is grossly normal.   Cardiac cath 12/21/20:  LEFT HEART CATH AND CORS/GRAFTS ANGIOGRAPHY   Conclusion      Ost Cx to Mid Cx lesion is 80% stenosed.   Prox RCA lesion is 45% stenosed.   Ramus lesion is 90% stenosed.   2nd RPL lesion is 80% stenosed.   Mid LAD lesion is 65% stenosed.   Ost LM to Mid LM lesion is 40% stenosed.   2nd Diag lesion is 90% stenosed.   LIMA graft was visualized by angiography and is normal in caliber.   SVG graft was visualized by angiography and is normal in caliber.   SVG graft was visualized by angiography and is normal in caliber.   The graft exhibits no disease.   The graft exhibits no disease.   The graft exhibits no disease.   LV end diastolic pressure is normal.   3 vessel obstructive CAD Patent LIMA - this is tied to the second diagonal and not the LAD Patent SVG to the ramus intermediate. No visualization of SVG to diagonal which per op note came off the hood of the SVG to ramus Patent SVG to the PL branch of the RCA  Normal LVEDP   Plan: there appears to be good blood flow to all major vessels. The LAD was not revascularized but has borderline disease and the patient had prior Myoview with no ischemia in this territory. Other grafts are patent. Recommend continued medical therapy. Does not appear to be volume overloaded. Echo pending.  Given significant difficulty from the radial approach I would recommend femoral access for any future Cardiac cath procedures.  Coronary Diagrams  Diagnostic Dominance: Right Intervention  Echo 01/08/21: IMPRESSIONS     1. Left ventricular ejection  fraction, by estimation, is 50 to 55%. The  left ventricle has low normal function. The left ventricle demonstrates  regional wall motion abnormalities (see scoring diagram/findings for  description). There is moderate left  ventricular hypertrophy. Left ventricular diastolic parameters are  consistent with Grade I diastolic dysfunction (impaired relaxation). There  is moderate hypokinesis of the left ventricular, basal-mid septal wall.   2. Right ventricular systolic function is moderately reduced. The right  ventricular size is mildly enlarged. There is normal pulmonary artery  systolic pressure. The estimated right ventricular systolic pressure is  22.5 mmHg.   3. Left atrial size was mildly dilated.   4. The mitral valve is abnormal. Trivial mitral valve regurgitation.   5. The aortic valve is tricuspid. Aortic valve regurgitation is not  visualized. Mild to moderate aortic valve sclerosis/calcification is  present, without any evidence of aortic stenosis.   6. The inferior vena cava is normal in size with greater than 50%  respiratory variability, suggesting right atrial pressure of 3 mmHg.   Comparison(s): Changes from prior study are noted. 09/05/2019: LVEF 60-65%,  severe LAE, moderate RAE.   Echo 06/04/22: IMPRESSIONS     1. Pt in atrial fibrillation at time of study.   2. Left ventricular ejection fraction, by estimation, is 50 to 55%. The  left ventricle has low normal function. The left ventricle has no regional  wall motion abnormalities. The left ventricular internal cavity size was  mildly dilated. There is mild  concentric left ventricular hypertrophy. Left ventricular diastolic  parameters are indeterminate.   3. Right ventricular systolic function is normal. The right ventricular  size is normal. There is normal pulmonary artery systolic pressure.   4. Left atrial size was mildly dilated.   5. Right atrial size was mildly dilated.   6. The mitral valve is grossly  normal. Mild mitral valve regurgitation.  No evidence of mitral stenosis.   7. The aortic valve is tricuspid. There is mild calcification of the  aortic valve. Aortic valve regurgitation is not visualized. Aortic valve  sclerosis is present, with no evidence of aortic valve stenosis.   8. Aortic dilatation noted. There is mild dilatation of the aortic root,  measuring 40 mm.   9. The inferior vena cava is normal in size with greater than 50%  respiratory variability, suggesting right atrial pressure of 3 mmHg.   Cardiac cath 06/03/22:  LEFT HEART CATH AND CORS/GRAFTS ANGIOGRAPHY   Conclusion  Multivessel CAD with mild-moderate left main stenosis, moderate LAD stenosis, severe ramus stenosis, severe PLA stenosis S/P CABG with continued graft patency as outlined Normal LVEDP   Med Rx. No culprit for progressive angina - stable anatomy from prior cath studies. Start apixaban tomorrow for new onset afib. Likely DC tomorrow if stable.  Diagnostic Dominance: Right  Intervention  ASSESSMENT AND PLAN:  1. CAD  s/p CABG x 4 on 04/02/19: recent cardiac cath in January 2024 showed patent grafts with  no clear culprit for acute chest pain symptoms. Continue ASA,  high dose statin and metoprolol. Chest pain improved on Ranexa. No clear cardiac cause for his dyspnea. EDP normal at cath and EF good. Will discontinue isosorbide.   Atrial fibrillation post op CABG.  Recurrent x 1. Converted to NSR. Now on Eliquis. No evidence of Afib so far.   3. HTN: Blood pressure is elevated but need to be liberal given recurrent syncope.    4. HLD: Continue on Lipitor 80 mg daily. LDL at goal 56      5. History of CVA on ASA and Plavix pre op. Now on Eliquis Only.      6. Cervical/lumbar spine disease. Followed by Dr. Danielle Dess.      7. Chronic diastolic CHF. Volume status looks good on exam. On lasix 20 mg daily prn. Takes < once a week. Continue sodium restriction.     8. Neuroendocrine tumor. Per  oncology  9.  Recurrent syncope. Clinically these are vasovagal with nausea +/-vomiting and profuse diaphoresis. Allow liberal BP. Encourage good hydration. Early recognition of symptoms and getting down as quickly as possible.    Current medicines are reviewed at length with the patient today.  The patient does not have concerns regarding medicines.  The following changes have been made:  See above  Labs/ tests ordered today include:   No orders of the defined types were placed in this encounter.    Disposition:   FU 4 months   Signed, Renise Gillies [place], MD  05/30/2023 4:39 PM    [facility] [address], [place], [place], [address] Phone [phone], Fax [phone]

## 2023-05-30 ENCOUNTER — Ambulatory Visit: Payer: Medicare Other | Attending: Cardiology | Admitting: Cardiology

## 2023-05-30 ENCOUNTER — Encounter: Payer: Self-pay | Admitting: Cardiology

## 2023-05-30 VITALS — BP 169/93 | HR 69 | Ht 67.0 in | Wt 227.0 lb

## 2023-05-30 DIAGNOSIS — I2510 Atherosclerotic heart disease of native coronary artery without angina pectoris: Secondary | ICD-10-CM

## 2023-05-30 DIAGNOSIS — I5032 Chronic diastolic (congestive) heart failure: Secondary | ICD-10-CM

## 2023-05-30 DIAGNOSIS — R55 Syncope and collapse: Secondary | ICD-10-CM

## 2023-05-30 DIAGNOSIS — I480 Paroxysmal atrial fibrillation: Secondary | ICD-10-CM

## 2023-05-30 NOTE — Patient Instructions (Signed)
Medication Instructions:  Stop Isosorbide  Continue all other medications *If you need a refill on your cardiac medications before your next appointment, please call your pharmacy*   Lab Work: None ordered   Testing/Procedures: None ordered   Follow-Up: At [facility], you and your health needs are our priority.  As part of our continuing mission to provide you with exceptional heart care, we have created designated Provider Care Teams.  These Care Teams include your primary Cardiologist (physician) and Advanced Practice Providers (APPs -  Physician Assistants and Nurse Practitioners) who all work together to provide you with the care you need, when you need it.  We recommend signing up for the patient portal called "MyChart".  Sign up information is provided on this After Visit Summary.  MyChart is used to connect with patients for Virtual Visits (Telemedicine).  Patients are able to view lab/test results, encounter notes, upcoming appointments, etc.  Non-urgent messages can be sent to your provider as well.   To learn more about what you can do with MyChart, go to [email or web address].    Your next appointment:  4 months      Provider:  Dr.Jordan

## 2023-06-08 ENCOUNTER — Inpatient Hospital Stay: Payer: Medicare Other

## 2023-06-08 ENCOUNTER — Inpatient Hospital Stay: Payer: Medicare Other | Admitting: Oncology

## 2023-06-08 VITALS — BP 91/60 | HR 70 | Temp 98.1°F | Resp 18 | Ht 67.0 in | Wt 222.0 lb

## 2023-06-08 DIAGNOSIS — R0609 Other forms of dyspnea: Secondary | ICD-10-CM | POA: Diagnosis not present

## 2023-06-08 DIAGNOSIS — C7A8 Other malignant neuroendocrine tumors: Secondary | ICD-10-CM

## 2023-06-08 DIAGNOSIS — Z85528 Personal history of other malignant neoplasm of kidney: Secondary | ICD-10-CM | POA: Diagnosis not present

## 2023-06-08 DIAGNOSIS — I4891 Unspecified atrial fibrillation: Secondary | ICD-10-CM | POA: Diagnosis not present

## 2023-06-08 DIAGNOSIS — I2510 Atherosclerotic heart disease of native coronary artery without angina pectoris: Secondary | ICD-10-CM | POA: Diagnosis not present

## 2023-06-08 DIAGNOSIS — C7A098 Malignant carcinoid tumors of other sites: Secondary | ICD-10-CM | POA: Diagnosis not present

## 2023-06-08 DIAGNOSIS — R112 Nausea with vomiting, unspecified: Secondary | ICD-10-CM | POA: Diagnosis not present

## 2023-06-08 LAB — CBC WITH DIFFERENTIAL (CANCER CENTER ONLY)
Abs Immature Granulocytes: 0.03 10*3/uL (ref 0.00–0.07)
Basophils Absolute: 0.1 10*3/uL (ref 0.0–0.1)
Basophils Relative: 1 %
Eosinophils Absolute: 0.2 10*3/uL (ref 0.0–0.5)
Eosinophils Relative: 3 %
HCT: 46.2 % (ref 39.0–52.0)
Hemoglobin: 15.4 g/dL (ref 13.0–17.0)
Immature Granulocytes: 0 %
Lymphocytes Relative: 19 %
Lymphs Abs: 1.4 10*3/uL (ref 0.7–4.0)
MCH: 31.8 pg (ref 26.0–34.0)
MCHC: 33.3 g/dL (ref 30.0–36.0)
MCV: 95.3 fL (ref 80.0–100.0)
Monocytes Absolute: 0.8 10*3/uL (ref 0.1–1.0)
Monocytes Relative: 11 %
Neutro Abs: 4.7 10*3/uL (ref 1.7–7.7)
Neutrophils Relative %: 66 %
Platelet Count: 224 10*3/uL (ref 150–400)
RBC: 4.85 MIL/uL (ref 4.22–5.81)
RDW: 12.9 % (ref 11.5–15.5)
WBC Count: 7.2 10*3/uL (ref 4.0–10.5)
nRBC: 0 % (ref 0.0–0.2)

## 2023-06-08 LAB — CMP (CANCER CENTER ONLY)
ALT: 6 U/L (ref 0–44)
AST: 7 U/L — ABNORMAL LOW (ref 15–41)
Albumin: 4.2 g/dL (ref 3.5–5.0)
Alkaline Phosphatase: 85 U/L (ref 38–126)
Anion gap: 10 (ref 5–15)
BUN: 18 mg/dL (ref 8–23)
CO2: 23 mmol/L (ref 22–32)
Calcium: 9.4 mg/dL (ref 8.9–10.3)
Chloride: 106 mmol/L (ref 98–111)
Creatinine: 1.27 mg/dL — ABNORMAL HIGH (ref 0.61–1.24)
GFR, Estimated: 57 mL/min — ABNORMAL LOW (ref >60)
Glucose, Bld: 120 mg/dL — ABNORMAL HIGH (ref 70–99)
Potassium: 4.2 mmol/L (ref 3.5–5.1)
Sodium: 139 mmol/L (ref 135–145)
Total Bilirubin: 0.8 mg/dL (ref 0.0–1.2)
Total Protein: 7.5 g/dL (ref 6.5–8.1)

## 2023-06-08 MED ORDER — OCTREOTIDE ACETATE 20 MG IM KIT
20.0000 mg | PACK | Freq: Once | INTRAMUSCULAR | Status: AC
  Administered 2023-06-08: 20 mg via INTRAMUSCULAR
  Filled 2023-06-08: qty 1

## 2023-06-08 NOTE — Progress Notes (Signed)
[facility] OFFICE PROGRESS NOTE   Diagnosis: Pancreas neuroendocrine tumor  INTERVAL HISTORY:   Jake Dunn returns as scheduled.  He complains of persistent exertional dyspnea.  He continues to have "dizziness ".  He describes vertigo symptoms when going from a lying or sitting to standing.  The dizziness occurs while he is in bed.  No chest pain.  He has intermittent nausea and vomiting.  Abdominal pain remains improved.  No flushing or diarrhea. No new syncope event.  Objective:  Vital signs in last 24 hours:  Blood pressure 91/60, pulse 70, temperature 98.1 F (36.7 C), temperature source Temporal, resp. rate 18, height 5\' 7"  (1.702 m), weight 222 lb (100.7 kg), SpO2 98%.    Lymphatics: No cervical, supraclavicular, axillary, or inguinal nodes Resp: Lungs clear bilaterally Cardio: Regular rate and rhythm GI: Mild tenderness in the mid upper and left lower abdomen, no mass, no hepatosplenomegaly Vascular: No leg edema Neuro: Alert and oriented, no nystagmus   Lab Results:  Lab Results  Component Value Date   WBC 7.2 06/08/2023   HGB 15.4 06/08/2023   HCT 46.2 06/08/2023   MCV 95.3 06/08/2023   PLT 224 06/08/2023   NEUTROABS 4.7 06/08/2023    CMP  Lab Results  Component Value Date   NA 139 06/08/2023   K 4.2 06/08/2023   CL 106 06/08/2023   CO2 23 06/08/2023   GLUCOSE 120 (H) 06/08/2023   BUN 18 06/08/2023   CREATININE 1.27 (H) 06/08/2023   CALCIUM 9.4 06/08/2023   PROT 7.5 06/08/2023   ALBUMIN 4.2 06/08/2023   AST 7 (L) 06/08/2023   ALT 6 06/08/2023   ALKPHOS 85 06/08/2023   BILITOT 0.8 06/08/2023   GFRNONAA 57 (L) 06/08/2023   GFRAA 66 11/21/2019     Medications: I have reviewed the patient's current medications.   Assessment/Plan: Pancreas masses CT chest 06/05/2022-10 mm right hilar node, 9 mm central right upper lobe nodule, 2 contiguous lobulated masses in the central abdomen, left nephrectomy MRI abdomen 06/06/2022-2 adjacent  hypervascular masses in the central pancreas with associated ductal dilatation and atrophy, probable prominent lymph node superior to the right mass, the right-sided mass causes mass effect at the celiac axis and portal vein Upper EUS 06/13/2022-mass identified in the pancreatic body, FNA performed, vascular involvement suspected.  Second similar smaller mass seen immediately adjacent to the larger mass.  Cytology-malignant cells present, solid pseudopapillary neoplasm favored PET scan 07/04/2022-mild increased FDG uptake associated with the dominant mass centered around the pancreatic neck.  Also mild increased uptake associated with the mass within the tail of the pancreas.  Solid-appearing lesion or lymph node within the gastrohepatic ligament which directly abuts the superior margin of the dominant pancreatic mass, also exhibits mild increased FDG uptake.  Mild FDG uptake associated with a right upper lobe lung nodule EUS 07/27/2022-"Twin" masses identified in the pancreas body, no pathology in the left lobe of the liver or gallbladder, no adenopathy, FNA biopsy-"atypical cells present "-could not be further characterize due to lack of cellularity on cell block Dotatate PET 07/28/2022-moderate radiotracer activity associated with pancreatic lesions including a small pancreas tail nodule consistent with well-differentiated versus moderately differentiated neuroendocrine tumor, no metastatic adenopathy or distant metastatic disease Chromogranin A elevated on 08/15/2022 Monthly Sandostatin initiated 08/24/2022 CTs 12/26/2022-unchanged pancreas masses, no evidence of metastatic disease in the chest abdomen or pelvis, unchanged right upper lobe nodule suspicious for a primary lung tumor 2.   Remote history of renal cell carcinoma, status  post a left nephrectomy 03/16/1994 status post left nephrectomy, renal cell carcinoma, pT2, pNX, pMX, tumor characterized by sheets and clusters of neoplastic clear to granular cells,  consistent with a renal cell carcinoma grade 2, no definite diagnostic vascular invasion of larger vessels, tumor does not appear to extend through the renal capsule. 3.   Atrial fibrillation 4.   Coronary artery disease, coronary artery bypass surgery in 2020 5.   History of CVA 6.   Multiple spine surgeries 7.   Left upper lobe nodule and borderline enlarged right hilar node on chest CT 06/05/2022 8.   Gout 9.   History of adenomatous polyps 10.  Family history of pancreas cancer 11.  Right upper lobe nodule with mild FDG uptake on PET 07/04/2022 Unchanged right upper lobe nodule on chest CT 12/26/2022 12.  History of dizziness/syncope and vertigo    Disposition: Jake Dunn has pancreas neuroendocrine tumors.  There is no clinical evidence of disease progression.  I doubt the dizziness or dyspnea are related to the pancreas tumors.  He describes vertigo symptoms.  His symptoms appear positional.  He may benefit from vestibular physical therapy.  His blood pressure is low today.  This could also be contributing to the presyncope.  We will repeat his blood pressure in the treatment room today.  He will see Dr. Cliffton Asters within the next few weeks.  The plan is to continue monthly Sandostatin.  He will undergo restaging CTs prior to an office visit in 3 months.  We will follow-up on the chromogranin a level from today, though he reports he did not discontinue pantoprazole for the lab draw. Thornton Papas, MD  06/08/2023  11:04 AM

## 2023-06-09 LAB — CHROMOGRANIN A: Chromogranin A (ng/mL): 132.3 ng/mL — ABNORMAL HIGH (ref 0.0–101.8)

## 2023-06-23 ENCOUNTER — Telehealth: Payer: Self-pay | Admitting: *Deleted

## 2023-06-23 NOTE — Telephone Encounter (Signed)
Attempted to reach Jake Dunn w/his lab/CT appointment for 4/17-answering machine picked with daughter, [name]'s voice. LVM for her to call office next week for appointment information or confirm mailing address and nurse will mail the appointment information to him.

## 2023-06-29 ENCOUNTER — Telehealth: Payer: Self-pay | Admitting: *Deleted

## 2023-06-29 NOTE — Telephone Encounter (Signed)
LVM on daughter's cell phone with appointments for 4/17 lab and CT scan. Will also provide a print out when he returns on 2/27 for his injection.

## 2023-06-30 DIAGNOSIS — R739 Hyperglycemia, unspecified: Secondary | ICD-10-CM | POA: Diagnosis not present

## 2023-06-30 DIAGNOSIS — G894 Chronic pain syndrome: Secondary | ICD-10-CM | POA: Diagnosis not present

## 2023-06-30 DIAGNOSIS — E785 Hyperlipidemia, unspecified: Secondary | ICD-10-CM | POA: Diagnosis not present

## 2023-06-30 DIAGNOSIS — E538 Deficiency of other specified B group vitamins: Secondary | ICD-10-CM | POA: Diagnosis not present

## 2023-06-30 DIAGNOSIS — I10 Essential (primary) hypertension: Secondary | ICD-10-CM | POA: Diagnosis not present

## 2023-06-30 DIAGNOSIS — I480 Paroxysmal atrial fibrillation: Secondary | ICD-10-CM | POA: Diagnosis not present

## 2023-07-06 ENCOUNTER — Inpatient Hospital Stay: Payer: Medicare Other | Attending: Nurse Practitioner

## 2023-07-06 VITALS — BP 133/77 | HR 71 | Temp 98.2°F | Resp 18

## 2023-07-06 DIAGNOSIS — C7A098 Malignant carcinoid tumors of other sites: Secondary | ICD-10-CM | POA: Diagnosis not present

## 2023-07-06 DIAGNOSIS — E3400 Carcinoid syndrome, unspecified: Secondary | ICD-10-CM | POA: Insufficient documentation

## 2023-07-06 DIAGNOSIS — C7A8 Other malignant neuroendocrine tumors: Secondary | ICD-10-CM

## 2023-07-06 MED ORDER — OCTREOTIDE ACETATE 20 MG IM KIT
20.0000 mg | PACK | Freq: Once | INTRAMUSCULAR | Status: AC
  Administered 2023-07-06: 20 mg via INTRAMUSCULAR

## 2023-07-06 NOTE — Patient Instructions (Signed)
 Octreotide Injection Solution What is this medication? OCTREOTIDE (ok TREE oh tide) treats high levels of growth hormone (acromegaly). It works by reducing the amount of growth hormone your body makes. This reduces symptoms and the risk of health problems caused by too much growth hormone, such as diabetes and heart disease. It may also be used to treat diarrhea caused by neuroendocrine tumors. It works by slowing down the release of serotonin from the tumor cells. This reduces the number of bowel movements you have. This medicine may be used for other purposes; ask your health care provider or pharmacist if you have questions. COMMON BRAND NAME(S): Berline Lopes, Sandostatin What should I tell my care team before I take this medication? They need to know if you have any of these conditions: Diabetes Gallbladder disease Heart disease Kidney disease Liver disease Pancreatic disease Thyroid disease An unusual or allergic reaction to octreotide, other medications, foods, dyes, or preservatives Pregnant or trying to get pregnant Breastfeeding How should I use this medication? This medication is injected under the skin or into a vein. It is usually given by your care team in a hospital or clinic setting. If you get this medication at home, you will be taught how to prepare and give it. Use exactly as directed. Take it as directed on the prescription label at the same time every day. Keep taking it unless your care team tells you to stop. Allow the injection solution to come to room temperature before use. Do not warm it artificially. It is important that you put your used needles and syringes in a special sharps container. Do not put them in a trash can. If you do not have a sharps container, call your pharmacist or care team to get one. Talk to your care team about the use of this medication in children. Special care may be needed. Overdosage: If you think you have taken too much of this medicine  contact a poison control center or emergency room at once. NOTE: This medicine is only for you. Do not share this medicine with others. What if I miss a dose? If you miss a dose, take it as soon as you can. If it is almost time for your next dose, take only that dose. Do not take double or extra doses. What may interact with this medication? Bromocriptine Certain medications for blood pressure, heart disease, irregular heartbeat Cyclosporine Diuretics Medications for diabetes, including insulin Quinidine This list may not describe all possible interactions. Give your health care provider a list of all the medicines, herbs, non-prescription drugs, or dietary supplements you use. Also tell them if you smoke, drink alcohol, or use illegal drugs. Some items may interact with your medicine. What should I watch for while using this medication? Visit your care team for regular checks on your progress. Tell your care team if your symptoms do not start to get better or if they get worse. To help reduce irritation at the injection site, use a different site for each injection and make sure the solution is at room temperature before use. This medication may cause decreases in blood sugar. Signs of low blood sugar include chills, cool, pale skin or cold sweats, drowsiness, extreme hunger, fast heartbeat, headache, nausea, nervousness or anxiety, shakiness, trembling, unsteadiness, tiredness, or weakness. Contact your care team right away if you experience any of these symptoms. This medication may increase blood sugar. The risk may be higher in patients who already have diabetes. Ask your care team what you can  do to lower your risk of diabetes while taking this medication. You should make sure you get enough vitamin B12 while you are taking this medication. Discuss the foods you eat and the vitamins you take with your care team. What side effects may I notice from receiving this medication? Side effects that  you should report to your care team as soon as possible: Allergic reactions--skin rash, itching, hives, swelling of the face, lips, tongue, or throat Gallbladder problems--severe stomach pain, nausea, vomiting, fever Heart rhythm changes--fast or irregular heartbeat, dizziness, feeling faint or lightheaded, chest pain, trouble breathing High blood sugar (hyperglycemia)--increased thirst or amount of urine, unusual weakness or fatigue, blurry vision Low blood sugar (hypoglycemia)--tremors or shaking, anxiety, sweating, cold or clammy skin, confusion, dizziness, rapid heartbeat Low thyroid levels (hypothyroidism)--unusual weakness or fatigue, increased sensitivity to cold, constipation, hair loss, dry skin, weight gain, feelings of depression Low vitamin B12 level--pain, tingling, or numbness in the hands or feet, muscle weakness, dizziness, confusion, trouble concentrating Oily or light-colored stools, diarrhea, bloating, weight loss Pancreatitis--severe stomach pain that spreads to your back or gets worse after eating or when touched, fever, nausea, vomiting Slow heartbeat--dizziness, feeling faint or lightheaded, confusion, trouble breathing, unusual weakness or fatigue Side effects that usually do not require medical attention (report these to your care team if they continue or are bothersome): Diarrhea Dizziness Headache Nausea Pain, redness, or irritation at injection site Stomach pain This list may not describe all possible side effects. Call your doctor for medical advice about side effects. You may report side effects to FDA at 1-800-FDA-1088. Where should I keep my medication? Keep out of the reach of children and pets. Store in the refrigerator. Protect from light. Allow to come to room temperature naturally. Do not use artificial heat. If protected from light, the injection may be stored between 20 and 30 degrees C (70 and 86 degrees F) for 14 days. After the initial use, throw away  any unused portion of a multiple dose vial after 14 days. Get rid of any unused portions of the ampules after use. To get rid of medications that are no longer needed or have expired: Take the medication to a medication take-back program. Ask your pharmacy or law enforcement to find a location. If you cannot return the medication, ask your pharmacist or care team how to get rid of the medication safely. NOTE: This sheet is a summary. It may not cover all possible information. If you have questions about this medicine, talk to your doctor, pharmacist, or health care provider.  2024 [employer] Standard (2023-04-07 00:00:00)

## 2023-07-13 ENCOUNTER — Telehealth: Payer: Self-pay | Admitting: Cardiology

## 2023-07-13 NOTE — Telephone Encounter (Signed)
 Spoke to patient . Patient staes he has been short of breath off and on for more than 6 months.   States  with activity  and rest . Especially activity .  Per patient no coughing noted  , no chest pain , no swelling . He states  he has gained 3 lbs  over night .   RN asked patient if he  spoken to primary about his condition - patient stated yes. Patient states he saw primary about week ago - she listen to his lungs and told him they were clear, per patient.  Patient states he almost went to ER yesterday but did not want to wait.  RN offered patient an appointment with one of Dr Elvis Coil assistant/practitioner. Patient decline  patient states he wanted to see Dr [place] only  Next available  appt - 3/2/125 at 11:40 am .   Patient is aware if symptoms become worse - go to nearest ER for evaluation . Patient verbalized understanding.

## 2023-07-13 NOTE — Telephone Encounter (Signed)
 Pt c/o Shortness Of Breath: STAT if SOB developed within the last 24 hours or pt is noticeably SOB on the phone  1. Are you currently SOB (can you hear that pt is SOB on the phone)? Yes,weezing  2. How long have you been experiencing SOB? Last couple of weeks but getting worse  3. Are you SOB when sitting or when up moving around? both  4. Are you currently experiencing any other symptoms? Heart fluttering

## 2023-07-13 NOTE — Telephone Encounter (Signed)
 Correction next appointment 07/28/23 at 11:40 am

## 2023-07-23 NOTE — Progress Notes (Unsigned)
 Cardiology Office Note   Date:  07/28/2023   ID:  Babatunde, Seago December 05, 1940, MRN [id number]  PCP:  Laurann Montana, MD  Cardiologist:   Bo Teicher [place], MD   Chief Complaint  Patient presents with   Shortness of Breath   Palpitations      History of Present Illness: Jake Dunn is a 83 y.o. male who is seen for evaluation of increased SOB. He is s/p CAD s/p CABG. He has a  PMH of CAD, GERD, HTN, HLD, renal cell CA s/p left nephrectomy. Cardiac catheterization in 2014 showed nonobstructive CAD with 70% lesion in third diagonal that was treated medically.    He presented with left sided chest pressure on 08/15/2016. He underwent cardiac catheterization on 08/16/2016, this revealed two-vessel CAD with moderate calcified stenosis in the mid LAD, second diagonal, ramus intermedius, ostial and proximal left circumflex and a severe stenosis of the second PLA branch of the RCA. Overall normal LVEDP and normal LVEF. Recommended  medical therapy and outpatient exercise stress Myoview. While the LAD and the diagonal only showed moderate progression compared to the 2010/2014 studies, the left circumflex and intermediate stenosis had progressed. Echocardiogram obtained on 08/17/2016 showed EF 55-60%, grade 2 diastolic dysfunction, severely dilated left atrium, PA peak pressure 30 mmHg. Lexiscan Myoview obtained on 08/23/2016 showed EF 51%, no perfusion defect at stress, overall low risk study.     He was seen in November 2020 with symptoms of chest pain and dyspnea. This led to a cardiac cath showing 3 vessel obstructive CAD. He underwent CABG on 04/02/19 by Dr Cliffton Asters. CABG X 4.  LIMA LAD, RSVG PLV, Ramus, and D2  (T graft off the ramus vein graft). Post op course complicated by Afib. He was loaded with amiodarone. On his  visit in January 2021 amiodarone and Eliquis were discontinued.   He did have a sleep study showing few hyponeic episodes without frank apnea. Did not meet criteria for CPAP trial.    He was admitted in August 2022 with recurrent chest pain. Troponins were normal. He underwent cardiac cath which showed good perfusion and no culprit lesion. Subsequent Echo was unremarkable.   He was admitted 1/26-1/31/24 after presenting with with waxing/waning chest pain similar to prior angina for several days. Due to persistent symptoms including DOE he came to the ER. Troponins were negative though symptoms concerning for angina so patient admitted to cardiology service for plan for echo and cardiac cath. He was also noted to be in atrial fib, rate controlled. Echo showed EF 50-55% with mild MR. Cardiac cath showed patent grafts with no clear culprit for his chest pain. He was started on Ranexa. He did convert to NSR and was DC on Eliquis. CT of the chest also showed a 4.5 cm thoracic aortic aneurysm. There was 2 large hypervascular masses in the abdomen/pancreas concerning for Cancer. Unable to do needle biopsy so outpatient ERCP recommended. 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 Subsequent evaluation c/w neuroendocrine tumor. Treated with Sandostatin-monthly injections- followed by Dr Truett Perna.   He was seen as a work in in June by Dr Antoine Poche for syncope sitting in a Animal nutritionist. Event monitor placed. He also complained of SOB. BNP level was 150.   On follow up today he complains of trouble breathing with periods of gasping for breath. Almost went the the ED last night. No chest pain. Does complain of intermittent palpitations/heart skipping. No cough. Weight is stable. States rescue inhaler  doesn't help much.    Past Medical History:  Diagnosis Date   Adenomatous polyp    Anxiety    Arthritis    "knees, ankles, shoulders" (08/15/2016)   CAD (coronary artery disease)    s/p cath in 2014 showing significant stenosis in the diagonal side branches and in the RV marginal branch. These vessels were small and diffusely diseased. He is managed medically.  08/16/16 Cath, no disease progression   Chronic lower back pain    Disc disease, degenerative, cervical    GERD (gastroesophageal reflux disease)    Gout    Hyperlipidemia    Hypertension    Osteoarthritis    Renal cell carcinoma    left kidney removal   Stroke [facility])    MINI STROKE 15+ YRS AGO, no residual   Stroke (HCC)    "I've had 2 or 3"; denies residual on 08/15/2016   Syncope and collapse     Past Surgical History:  Procedure Laterality Date   ANTERIOR CERVICAL DECOMP/DISCECTOMY FUSION  02/2004; 04/2005   Hattie Perch 09/20/2010; Hattie Perch 09/20/2010   APPLICATION OF ROBOTIC ASSISTANCE FOR SPINAL PROCEDURE N/A 01/02/2018   Procedure: APPLICATION OF ROBOTIC ASSISTANCE FOR SPINAL PROCEDURE;  Surgeon: Barnett Abu, MD;  Location: [facility] OR;  Service: Neurosurgery;  Laterality: N/A;   BACK SURGERY     CARDIAC CATHETERIZATION  12/14/2008   ef 50-55%. SHOWED SIGNIFICANT STENOSIS AND TO DIAGONAL SIDE BRANCHES AND IN THE RIGHT VENTRICULAR MARGINAL BRANCH. THESE VESSELS WERE SMALL AND DIFFUSELY DISEASED   CARDIOVASCULAR STRESS TEST  12/10/2009   EF 61%. EKG negative. Mild peri ischemia. Managed medically.    CARPAL TUNNEL RELEASE Right 11/2005   Hattie Perch 09/20/2010   CARPAL TUNNEL RELEASE Left 06/2007   Hattie Perch 5/132012   COLONOSCOPY  11/2004   Hattie Perch 09/20/2010   COLONOSCOPY W/ BIOPSIES AND POLYPECTOMY  09/2002   Hattie Perch 09/20/2010   CORONARY ARTERY BYPASS GRAFT N/A 04/02/2019   Procedure: CORONARY ARTERY BYPASS GRAFTING (CABG) using LIMA to LAD; Endoscopically harvested right greater saphenous vein to the PLB, Ramus, and Diag 2.;  Surgeon: Corliss Skains, MD;  Location: [facility] OR;  Service: Open Heart Surgery;  Laterality: N/A;   ESOPHAGOGASTRODUODENOSCOPY (EGD) WITH PROPOFOL N/A 06/13/2022   Procedure: ESOPHAGOGASTRODUODENOSCOPY (EGD) WITH PROPOFOL;  Surgeon: Willis Modena, MD;  Location: [facility] ENDOSCOPY;  Service: Gastroenterology;  Laterality: N/A;   ESOPHAGOGASTRODUODENOSCOPY (EGD) WITH PROPOFOL  Bilateral 07/27/2022   Procedure: ESOPHAGOGASTRODUODENOSCOPY (EGD) WITH PROPOFOL;  Surgeon: Willis Modena, MD;  Location: [facility];  Service: Gastroenterology;  Laterality: Bilateral;   EUS Bilateral 07/27/2022   Procedure: UPPER ENDOSCOPIC ULTRASOUND (EUS) LINEAR;  Surgeon: Willis Modena, MD;  Location: [facility];  Service: Gastroenterology;  Laterality: Bilateral;   FINE NEEDLE ASPIRATION N/A 06/13/2022   Procedure: FINE NEEDLE ASPIRATION (FNA) LINEAR;  Surgeon: Willis Modena, MD;  Location: [facility];  Service: Gastroenterology;  Laterality: N/A;   FINE NEEDLE ASPIRATION Bilateral 07/27/2022   Procedure: FINE NEEDLE ASPIRATION (FNA) LINEAR;  Surgeon: Willis Modena, MD;  Location: [facility];  Service: Gastroenterology;  Laterality: Bilateral;   INCISION AND DRAINAGE ABSCESS Right 10/18/2016   Procedure: INCISION AND DRAINAGE ABSCESS RIGHT SHOULDER;  Surgeon: Sheral Apley, MD;  Location: [facility];  Service: Orthopedics;  Laterality: Right;   INCISION AND DRAINAGE OF WOUND Right 08/2005   shoulder/notes 09/20/2010   IR FLUORO GUIDE CV LINE LEFT  10/19/2016   IR US GUIDE VASC ACCESS LEFT  10/19/2016   IRRIGATION AND DEBRIDEMENT SHOULDER Right 12/22/2016   Procedure:  IRRIGATION AND DEBRIDEMENT RIGHT SHOULDER;  Surgeon: Loreta Ave, MD;  Location: [facility] OR;  Service: Orthopedics;  Laterality: Right;   JOINT REPLACEMENT     LEFT HEART CATH AND CORONARY ANGIOGRAPHY N/A 08/16/2016   Procedure: Left Heart Cath and Coronary Angiography;  Surgeon: Tonny Bollman, MD;  Location: [facility] INVASIVE CV LAB;  Service: Cardiovascular;  Laterality: N/A;   LEFT HEART CATH AND CORONARY ANGIOGRAPHY N/A 03/26/2019   Procedure: LEFT HEART CATH AND CORONARY ANGIOGRAPHY;  Surgeon: [place], Barri Neidlinger M, MD;  Location: [facility] INVASIVE CV LAB;  Service: Cardiovascular;  Laterality: N/A;   LEFT HEART CATH AND CORS/GRAFTS ANGIOGRAPHY N/A 12/21/2020   Procedure: LEFT HEART CATH AND CORS/GRAFTS  ANGIOGRAPHY;  Surgeon: [place], Loreal Schuessler M, MD;  Location: [facility] INVASIVE CV LAB;  Service: Cardiovascular;  Laterality: N/A;   LEFT HEART CATH AND CORS/GRAFTS ANGIOGRAPHY N/A 06/03/2022   Procedure: LEFT HEART CATH AND CORS/GRAFTS ANGIOGRAPHY;  Surgeon: Tonny Bollman, MD;  Location: [facility] INVASIVE CV LAB;  Service: Cardiovascular;  Laterality: N/A;   LEFT HEART CATHETERIZATION WITH CORONARY ANGIOGRAM N/A 08/09/2012   Procedure: LEFT HEART CATHETERIZATION WITH CORONARY ANGIOGRAM;  Surgeon: Cayleb Jarnigan M [place], MD;  Location: [facility] CATH LAB;  Service: Cardiovascular;  Laterality: N/A;   LUMBAR FUSION     NEPHRECTOMY Left early 2000s   REPLACEMENT TOTAL KNEE Right 08/2008   Hattie Perch 09/07/2010   SHOULDER ARTHROSCOPY WITH ROTATOR CUFF REPAIR Right 06/2005   Hattie Perch 09/20/2010   TOTAL KNEE ARTHROPLASTY  06/08/2011   Procedure: TOTAL KNEE ARTHROPLASTY;  Surgeon: Loreta Ave, MD;  Location: [facility] OR;  Service: Orthopedics;  Laterality: Left;   TOTAL SHOULDER ARTHROPLASTY Right 04/06/2016   Procedure: RIGHT REVERSE TOTAL SHOULDER ARTHROPLASTY;  Surgeon: Loreta Ave, MD;  Location: [facility] OR;  Service: Orthopedics;  Laterality: Right;   TOTAL SHOULDER REVISION Right 12/22/2016   Procedure: RIGHT SHOULDER GLENOID AND HUMERAL COMPONENT REVISION;  Surgeon: Loreta Ave, MD;  Location: [facility] OR;  Service: Orthopedics;  Laterality: Right;   UPPER ESOPHAGEAL ENDOSCOPIC ULTRASOUND (EUS) Bilateral 06/13/2022   Procedure: UPPER ESOPHAGEAL ENDOSCOPIC ULTRASOUND (EUS);  Surgeon: Willis Modena, MD;  Location: [facility];  Service: Gastroenterology;  Laterality: Bilateral;   US ECHOCARDIOGRAPHY  12/15/2008   EF 60-65%   US ECHOCARDIOGRAPHY  09/28/2004   EF 55-60%     Current Outpatient Medications  Medication Sig Dispense Refill   acyclovir (ZOVIRAX) 400 MG tablet Take 400 mg by mouth 3 (three) times daily as needed (For cold sores). .  5   albuterol (VENTOLIN HFA) 108 (90 Base) MCG/ACT inhaler 2 puffs every 6 (six) hours as needed.      allopurinol (ZYLOPRIM) 300 MG tablet Take 300 mg by mouth daily.     apixaban (ELIQUIS) 5 MG TABS tablet TAKE 1 TABLET BY MOUTH TWICE A DAY 60 tablet 5   aspirin EC 81 MG tablet Take 81 mg by mouth daily.     Cyanocobalamin 1000 MCG SUBL Take 1,000 mcg by mouth daily.     HYDROcodone-acetaminophen (NORCO/VICODIN) 5-325 MG tablet Take 1 tablet by mouth 3 (three) times daily as needed for moderate pain. 30 tablet 0   LORazepam (ATIVAN) 0.5 MG tablet Take 1 tablet (0.5 mg total) by mouth 2 (two) times daily as needed for anxiety. 30 tablet 2   metoprolol tartrate (LOPRESSOR) 25 MG tablet Take 25 mg by mouth 2 (two) times daily.     ondansetron (ZOFRAN) 8 MG tablet TAKE 1 TABLET BY MOUTH EVERY 8 HOURS AS NEEDED FOR NAUSEA  AND/OR VOMITING 30 tablet 3   pantoprazole (PROTONIX) 40 MG tablet Take 40 mg by mouth every evening.      prochlorperazine (COMPAZINE) 10 MG tablet TAKE 1 TABLET BY MOUTH EVERY 6 HOURS AS NEEDED 60 tablet 1   ranolazine (RANEXA) 1000 MG SR tablet TAKE 1 TABLET BY MOUTH TWO TIMES A DAY 180 tablet 3   sertraline (ZOLOFT) 50 MG tablet Take 50 mg by mouth daily.     TRELEGY ELLIPTA 100-62.5-25 MCG/ACT AEPB Inhale 1 puff into the lungs daily.     atorvastatin (LIPITOR) 80 MG tablet Take 1 tablet (80 mg total) by mouth daily. 90 tablet 3   nitroGLYCERIN (NITROSTAT) 0.4 MG SL tablet Place 1 tablet (0.4 mg total) under the tongue every 5 (five) minutes as needed for chest pain. (Patient not taking: Reported on 08/09/2022) 90 tablet 3   No current facility-administered medications for this visit.    Allergies:   Latex and Tape    Social History:  The patient  reports that he quit smoking about 34 years ago. His smoking use included cigarettes. He has never used smokeless tobacco. He reports current alcohol use. He reports that he does not use drugs.   Family History:  The patient's family history includes Breast cancer in his sister and sister; Cancer in his maternal aunt and maternal  uncle; Esophageal cancer in his sister; Heart attack in his father; Heart disease in his brother; Lung cancer in his brother; Pancreatic cancer in his sister; Prostate cancer in his brother; Skin cancer in his sister.    ROS:  Please see the history of present illness.   Otherwise, review of systems are positive for none.   All other systems are reviewed and negative.    PHYSICAL EXAM: VS:  BP 118/70 (BP Location: Left Arm, Patient Position: Sitting)   Pulse 63   Ht 5\' 7"  (1.702 m)   Wt 226 lb 3.2 oz (102.6 kg)   SpO2 96%   BMI 35.43 kg/m  , BMI Body mass index is 35.43 kg/m.  GEN: Well nourished, well developed, in no acute distress  HEENT: normal  Neck: no JVD, carotid bruits, or masses Cardiac: RRR; no murmurs, rubs, or gallops,no edema. Respiratory:  clear to auscultation bilaterally, normal work of breathing GI: soft, nontender, nondistended, + BS MS: no deformity or atrophy  Skin: warm and dry, no rash Neuro:  Strength and sensation are intact Psych: euthymic mood, full affect   EKG:  EKG is not ordered today.   Recent Labs: 10/27/2022: BNP 149.9 06/08/2023: ALT 6; BUN 18; Creatinine 1.27; Hemoglobin 15.4; Platelet Count 224; Potassium 4.2; Sodium 139    Lipid Panel    Component Value Date/Time   CHOL 120 11/21/2019 0953   TRIG 112 11/21/2019 0953   HDL 44 11/21/2019 0953   CHOLHDL 2.7 11/21/2019 0953   CHOLHDL 3.4 03/30/2019 0212   VLDL 35 03/30/2019 0212   LDLCALC 56 11/21/2019 0953    Dated 01/25/19: cholesterol 117, triglycerides 125, HDL 39, LDL 53. CMET, CBC, TSH normal Dated 04/09/20: cholesterol 123, triglycerides 130, HDL 44, LDL 34. Creatinine 1.17. otherwise CBC, CMET and TSH normal Dated 02/16/21: cholesterol 118, triglycerides 99, HDL 41, LDL 58. Dated 12/02/21: cholesterol 128, triglycerides 195, HDL 39, LDL 57, CBC, CMET and TSH normal Dated 12/16/22: cholesterol 119, triglycerides 162, HDL 41, LDL 50 Dated 06/30/23: A1c 4.0%. creatinine 1.27. otherwise  normal CBC and CMET  Wt Readings from Last 3 Encounters:  07/28/23 226 lb  3.2 oz (102.6 kg)  06/08/23 222 lb (100.7 kg)  05/30/23 227 lb (103 kg)      Other studies Reviewed: Additional studies/ records that were reviewed today include:   Cath 03/26/19: Procedures  LEFT HEART CATH AND CORONARY ANGIOGRAPHY  Conclusion    Prox RCA lesion is 45% stenosed. 2nd RPL lesion is 80% stenosed. Ost LM to Mid LM lesion is 50% stenosed. Ost Cx to Mid Cx lesion is 80% stenosed. Ramus lesion is 90% stenosed. Mid LAD lesion is 70% stenosed. 2nd Diag lesion is 60% stenosed. The left ventricular systolic function is normal. LV end diastolic pressure is normal. The left ventricular ejection fraction is 55-65% by visual estimate.   1. Three vessel obstructive, Calcific CAD 2. Normal LV function 3. Normal LVEDP   Plan: recommend referral to CT surgery for CABG. Will stop Plavix.     Echo 03/30/19: IMPRESSIONS      1. Left ventricular ejection fraction, by visual estimation, is 55 to 60%. The left ventricle has normal function. There is no left ventricular hypertrophy.  2. Global right ventricle has mildly reduced systolic function.The right ventricular size is mildly enlarged. No increase in right ventricular wall thickness.  3. Left atrial size was normal.  4. Right atrial size was mildly dilated.  5. The mitral valve is normal in structure. No evidence of mitral valve regurgitation.  6. The tricuspid valve is normal in structure. Tricuspid valve regurgitation is trivial.  7. The aortic valve is normal in structure. Aortic valve regurgitation is not visualized. No evidence of aortic valve sclerosis or stenosis.  8. The pulmonic valve was grossly normal. Pulmonic valve regurgitation is not visualized.  9. Aortic dilatation noted. 10. There is mild dilatation of the ascending aorta measuring 38 mm. 11. Normal pulmonary artery systolic pressure. 12. The atrial septum is grossly  normal.   Cardiac cath 12/21/20:  LEFT HEART CATH AND CORS/GRAFTS ANGIOGRAPHY   Conclusion      Ost Cx to Mid Cx lesion is 80% stenosed.   Prox RCA lesion is 45% stenosed.   Ramus lesion is 90% stenosed.   2nd RPL lesion is 80% stenosed.   Mid LAD lesion is 65% stenosed.   Ost LM to Mid LM lesion is 40% stenosed.   2nd Diag lesion is 90% stenosed.   LIMA graft was visualized by angiography and is normal in caliber.   SVG graft was visualized by angiography and is normal in caliber.   SVG graft was visualized by angiography and is normal in caliber.   The graft exhibits no disease.   The graft exhibits no disease.   The graft exhibits no disease.   LV end diastolic pressure is normal.   3 vessel obstructive CAD Patent LIMA - this is tied to the second diagonal and not the LAD Patent SVG to the ramus intermediate. No visualization of SVG to diagonal which per op note came off the hood of the SVG to ramus Patent SVG to the PL branch of the RCA Normal LVEDP   Plan: there appears to be good blood flow to all major vessels. The LAD was not revascularized but has borderline disease and the patient had prior Myoview with no ischemia in this territory. Other grafts are patent. Recommend continued medical therapy. Does not appear to be volume overloaded. Echo pending.  Given significant difficulty from the radial approach I would recommend femoral access for any future Cardiac cath procedures.  Coronary Diagrams  Diagnostic Dominance: Right Intervention  Echo 01/08/21: IMPRESSIONS     1. Left ventricular ejection fraction, by estimation, is 50 to 55%. The  left ventricle has low normal function. The left ventricle demonstrates  regional wall motion abnormalities (see scoring diagram/findings for  description). There is moderate left  ventricular hypertrophy. Left ventricular diastolic parameters are  consistent with Grade I diastolic dysfunction (impaired relaxation). There  is  moderate hypokinesis of the left ventricular, basal-mid septal wall.   2. Right ventricular systolic function is moderately reduced. The right  ventricular size is mildly enlarged. There is normal pulmonary artery  systolic pressure. The estimated right ventricular systolic pressure is  22.5 mmHg.   3. Left atrial size was mildly dilated.   4. The mitral valve is abnormal. Trivial mitral valve regurgitation.   5. The aortic valve is tricuspid. Aortic valve regurgitation is not  visualized. Mild to moderate aortic valve sclerosis/calcification is  present, without any evidence of aortic stenosis.   6. The inferior vena cava is normal in size with greater than 50%  respiratory variability, suggesting right atrial pressure of 3 mmHg.   Comparison(s): Changes from prior study are noted. 09/05/2019: LVEF 60-65%,  severe LAE, moderate RAE.   Echo 06/04/22: IMPRESSIONS     1. Pt in atrial fibrillation at time of study.   2. Left ventricular ejection fraction, by estimation, is 50 to 55%. The  left ventricle has low normal function. The left ventricle has no regional  wall motion abnormalities. The left ventricular internal cavity size was  mildly dilated. There is mild  concentric left ventricular hypertrophy. Left ventricular diastolic  parameters are indeterminate.   3. Right ventricular systolic function is normal. The right ventricular  size is normal. There is normal pulmonary artery systolic pressure.   4. Left atrial size was mildly dilated.   5. Right atrial size was mildly dilated.   6. The mitral valve is grossly normal. Mild mitral valve regurgitation.  No evidence of mitral stenosis.   7. The aortic valve is tricuspid. There is mild calcification of the  aortic valve. Aortic valve regurgitation is not visualized. Aortic valve  sclerosis is present, with no evidence of aortic valve stenosis.   8. Aortic dilatation noted. There is mild dilatation of the aortic root,  measuring 40  mm.   9. The inferior vena cava is normal in size with greater than 50%  respiratory variability, suggesting right atrial pressure of 3 mmHg.   Cardiac cath 06/03/22:  LEFT HEART CATH AND CORS/GRAFTS ANGIOGRAPHY   Conclusion  Multivessel CAD with mild-moderate left main stenosis, moderate LAD stenosis, severe ramus stenosis, severe PLA stenosis S/P CABG with continued graft patency as outlined Normal LVEDP   Med Rx. No culprit for progressive angina - stable anatomy from prior cath studies. Start apixaban tomorrow for new onset afib. Likely DC tomorrow if stable.  Diagnostic Dominance: Right  Intervention  ASSESSMENT AND PLAN:  1. Intermittent acute SOB. Does not appear to be volume overloaded today. Weight is stable and no rales or edema. He could be having intermittent Afib but pulse is very regular today. No angina. Suspect his is more pulmonary related. We will have him wear a 2 week event monitor. Asked him to touch base with pulmonary. ? Short course of steroids ? If triggered by recent increase in pollen.   2. CAD  s/p CABG x 4 on 04/02/19: recent cardiac cath in January 2024 showed patent grafts with no clear culprit for acute chest pain symptoms. Continue ASA,  high dose statin and metoprolol. Chest pain improved on Ranexa. No clear cardiac cause for his dyspnea. EDP normal at cath and EF good.    3. Atrial fibrillation post op CABG.  Recurrent x 1. Converted to NSR. Now on Eliquis.will repeat Event monitor as noted.   4. HTN: Blood pressure is doing very well.s   5. HLD: Continue on Lipitor 80 mg daily. LDL at goal 56      5. History of CVA on ASA and Plavix pre op. Now on Eliquis Only.      6. Cervical/lumbar spine disease. Followed by Dr. Danielle Dess.      7. Chronic diastolic CHF. Volume status looks good on exam. Continue sodium restriction.     8. Neuroendocrine tumor. Per oncology. Due for repeat CT in April  9.  Recurrent syncope. Clinically these are vasovagal with  nausea +/-vomiting and profuse diaphoresis. Allow liberal BP. Encourage good hydration. Early recognition of symptoms and getting down as quickly as possible.    Current medicines are reviewed at length with the patient today.  The patient does not have concerns regarding medicines.  The following changes have been made:  See above  Labs/ tests ordered today include:   Orders Placed This Encounter  Procedures   LONG TERM MONITOR (3-14 DAYS)     Disposition:   FU 3 months with APP  Signed, Graysin Luczynski [place], MD  07/28/2023 12:03 PM    [facility] [address], [place], [place], [address] Phone [phone], Fax [phone]

## 2023-07-28 ENCOUNTER — Ambulatory Visit (INDEPENDENT_AMBULATORY_CARE_PROVIDER_SITE_OTHER)

## 2023-07-28 ENCOUNTER — Encounter: Payer: Self-pay | Admitting: Cardiology

## 2023-07-28 ENCOUNTER — Ambulatory Visit: Attending: Cardiology | Admitting: Cardiology

## 2023-07-28 VITALS — BP 118/70 | HR 63 | Ht 67.0 in | Wt 226.2 lb

## 2023-07-28 DIAGNOSIS — R002 Palpitations: Secondary | ICD-10-CM

## 2023-07-28 DIAGNOSIS — I2510 Atherosclerotic heart disease of native coronary artery without angina pectoris: Secondary | ICD-10-CM

## 2023-07-28 DIAGNOSIS — I480 Paroxysmal atrial fibrillation: Secondary | ICD-10-CM

## 2023-07-28 DIAGNOSIS — R0602 Shortness of breath: Secondary | ICD-10-CM

## 2023-07-28 NOTE — Progress Notes (Unsigned)
 Enrolled patient for a 14 day Zio XT  monitor to be mailed to patients home

## 2023-07-28 NOTE — Patient Instructions (Addendum)
 Medication Instructions:  Continue same medications *If you need a refill on your cardiac medications before your next appointment, please call your pharmacy*   Lab Work: None ordered   Testing/Procedures: 2 week Heart Monitor ( Zio ) will be mailed to your home with instructions   Follow-Up: At [facility], you and your health needs are our priority.  As part of our continuing mission to provide you with exceptional heart care, we have created designated Provider Care Teams.  These Care Teams include your primary Cardiologist (physician) and Advanced Practice Providers (APPs -  Physician Assistants and Nurse Practitioners) who all work together to provide you with the care you need, when you need it.  We recommend signing up for the patient portal called "MyChart".  Sign up information is provided on this After Visit Summary.  MyChart is used to connect with patients for Virtual Visits (Telemedicine).  Patients are able to view lab/test results, encounter notes, upcoming appointments, etc.  Non-urgent messages can be sent to your provider as well.   To learn more about what you can do with MyChart, go to [email or web address].    Your next appointment:  3 months    Provider:  Bernadene Person NP    Schedule appointment with your Pulmonologist

## 2023-07-28 NOTE — Addendum Note (Signed)
 Addended by: Neoma Laming on: 07/28/2023 12:15 PM   Modules accepted: Orders

## 2023-08-03 ENCOUNTER — Inpatient Hospital Stay: Payer: Medicare Other | Attending: Nurse Practitioner

## 2023-08-03 VITALS — HR 73 | Temp 98.3°F | Resp 16 | Ht 67.0 in | Wt 229.2 lb

## 2023-08-03 DIAGNOSIS — C7A098 Malignant carcinoid tumors of other sites: Secondary | ICD-10-CM | POA: Insufficient documentation

## 2023-08-03 DIAGNOSIS — C7A8 Other malignant neuroendocrine tumors: Secondary | ICD-10-CM

## 2023-08-03 MED ORDER — OCTREOTIDE ACETATE 20 MG IM KIT
20.0000 mg | PACK | Freq: Once | INTRAMUSCULAR | Status: AC
  Administered 2023-08-03: 20 mg via INTRAMUSCULAR
  Filled 2023-08-03: qty 1

## 2023-08-03 NOTE — Patient Instructions (Signed)
 [facility]   Discharge Instructions: Thank you for choosing [facility] to provide your oncology and hematology care.   If you have a lab appointment with the Cancer Center, please go directly to the Cancer Center and check in at the registration area.   Wear comfortable clothing and clothing appropriate for easy access to any Portacath or PICC line.   We strive to give you quality time with your provider. You may need to reschedule your appointment if you arrive late (15 or more minutes).  Arriving late affects you and other patients whose appointments are after yours.  Also, if you miss three or more appointments without notifying the office, you may be dismissed from the clinic at the provider's discretion.      For prescription refill requests, have your pharmacy contact our office and allow 72 hours for refills to be completed.    Today you received the following chemotherapy and/or immunotherapy agents SANDOSTATIN      To help prevent nausea and vomiting after your treatment, we encourage you to take your nausea medication as directed.  BELOW ARE SYMPTOMS THAT SHOULD BE REPORTED IMMEDIATELY: *FEVER GREATER THAN 100.4 F (38 C) OR HIGHER *CHILLS OR SWEATING *NAUSEA AND VOMITING THAT IS NOT CONTROLLED WITH YOUR NAUSEA MEDICATION *UNUSUAL SHORTNESS OF BREATH *UNUSUAL BRUISING OR BLEEDING *URINARY PROBLEMS (pain or burning when urinating, or frequent urination) *BOWEL PROBLEMS (unusual diarrhea, constipation, pain near the anus) TENDERNESS IN MOUTH AND THROAT WITH OR WITHOUT PRESENCE OF ULCERS (sore throat, sores in mouth, or a toothache) UNUSUAL RASH, SWELLING OR PAIN  UNUSUAL VAGINAL DISCHARGE OR ITCHING   Items with * indicate a potential emergency and should be followed up as soon as possible or go to the Emergency Department if any problems should occur.  Please show the CHEMOTHERAPY ALERT CARD or  IMMUNOTHERAPY ALERT CARD at check-in to the Emergency Department and triage nurse.  Should you have questions after your visit or need to cancel or reschedule your appointment, please contact [facility]  Dept: [phone]  and follow the prompts.  Office hours are 8:00 a.m. to 4:30 p.m. Monday - Friday. Please note that voicemails left after 4:00 p.m. may not be returned until the following business day.  We are closed weekends and major holidays. You have access to a nurse at all times for urgent questions. Please call the main number to the clinic Dept: [phone] and follow the prompts.   For any non-urgent questions, you may also contact your provider using MyChart. We now offer e-Visits for anyone 38 and older to request care online for non-urgent symptoms. For details visit [email or web address].   Also download the MyChart app! Go to the app store, search "MyChart", open the app, select [facility], and log in with your MyChart username and password.  Octreotide Injection Solution What is this medication? OCTREOTIDE (ok TREE oh tide) treats high levels of growth hormone (acromegaly). It works by reducing the amount of growth hormone your body makes. This reduces symptoms and the risk of health problems caused by too much growth hormone, such as diabetes and heart disease. It may also be used to treat diarrhea caused by neuroendocrine tumors. It works by slowing down the release of serotonin from the tumor cells. This reduces the number of bowel movements you have. This medicine may be used for other purposes; ask  your health care provider or pharmacist if you have questions. COMMON BRAND NAME(S): Berline Lopes, Sandostatin What should I tell my care team before I take this medication? They need to know if you have any of these conditions: Diabetes Gallbladder disease Heart disease Kidney disease Liver disease Pancreatic  disease Thyroid disease An unusual or allergic reaction to octreotide, other medications, foods, dyes, or preservatives Pregnant or trying to get pregnant Breastfeeding How should I use this medication? This medication is injected under the skin or into a vein. It is usually given by your care team in a hospital or clinic setting. If you get this medication at home, you will be taught how to prepare and give it. Use exactly as directed. Take it as directed on the prescription label at the same time every day. Keep taking it unless your care team tells you to stop. Allow the injection solution to come to room temperature before use. Do not warm it artificially. It is important that you put your used needles and syringes in a special sharps container. Do not put them in a trash can. If you do not have a sharps container, call your pharmacist or care team to get one. Talk to your care team about the use of this medication in children. Special care may be needed. Overdosage: If you think you have taken too much of this medicine contact a poison control center or emergency room at once. NOTE: This medicine is only for you. Do not share this medicine with others. What if I miss a dose? If you miss a dose, take it as soon as you can. If it is almost time for your next dose, take only that dose. Do not take double or extra doses. What may interact with this medication? Bromocriptine Certain medications for blood pressure, heart disease, irregular heartbeat Cyclosporine Diuretics Medications for diabetes, including insulin Quinidine This list may not describe all possible interactions. Give your health care provider a list of all the medicines, herbs, non-prescription drugs, or dietary supplements you use. Also tell them if you smoke, drink alcohol, or use illegal drugs. Some items may interact with your medicine. What should I watch for while using this medication? Visit your care team for regular  checks on your progress. Tell your care team if your symptoms do not start to get better or if they get worse. To help reduce irritation at the injection site, use a different site for each injection and make sure the solution is at room temperature before use. This medication may cause decreases in blood sugar. Signs of low blood sugar include chills, cool, pale skin or cold sweats, drowsiness, extreme hunger, fast heartbeat, headache, nausea, nervousness or anxiety, shakiness, trembling, unsteadiness, tiredness, or weakness. Contact your care team right away if you experience any of these symptoms. This medication may increase blood sugar. The risk may be higher in patients who already have diabetes. Ask your care team what you can do to lower your risk of diabetes while taking this medication. You should make sure you get enough vitamin B12 while you are taking this medication. Discuss the foods you eat and the vitamins you take with your care team. What side effects may I notice from receiving this medication? Side effects that you should report to your care team as soon as possible: Allergic reactions--skin rash, itching, hives, swelling of the face, lips, tongue, or throat Gallbladder problems--severe stomach pain, nausea, vomiting, fever Heart rhythm changes--fast or irregular heartbeat, dizziness, feeling faint or  lightheaded, chest pain, trouble breathing High blood sugar (hyperglycemia)--increased thirst or amount of urine, unusual weakness or fatigue, blurry vision Low blood sugar (hypoglycemia)--tremors or shaking, anxiety, sweating, cold or clammy skin, confusion, dizziness, rapid heartbeat Low thyroid levels (hypothyroidism)--unusual weakness or fatigue, increased sensitivity to cold, constipation, hair loss, dry skin, weight gain, feelings of depression Low vitamin B12 level--pain, tingling, or numbness in the hands or feet, muscle weakness, dizziness, confusion, trouble  concentrating Oily or light-colored stools, diarrhea, bloating, weight loss Pancreatitis--severe stomach pain that spreads to your back or gets worse after eating or when touched, fever, nausea, vomiting Slow heartbeat--dizziness, feeling faint or lightheaded, confusion, trouble breathing, unusual weakness or fatigue Side effects that usually do not require medical attention (report these to your care team if they continue or are bothersome): Diarrhea Dizziness Headache Nausea Pain, redness, or irritation at injection site Stomach pain This list may not describe all possible side effects. Call your doctor for medical advice about side effects. You may report side effects to FDA at 1-800-FDA-1088. Where should I keep my medication? Keep out of the reach of children and pets. Store in the refrigerator. Protect from light. Allow to come to room temperature naturally. Do not use artificial heat. If protected from light, the injection may be stored between 20 and 30 degrees C (70 and 86 degrees F) for 14 days. After the initial use, throw away any unused portion of a multiple dose vial after 14 days. Get rid of any unused portions of the ampules after use. To get rid of medications that are no longer needed or have expired: Take the medication to a medication take-back program. Ask your pharmacy or law enforcement to find a location. If you cannot return the medication, ask your pharmacist or care team how to get rid of the medication safely. NOTE: This sheet is a summary. It may not cover all possible information. If you have questions about this medicine, talk to your doctor, pharmacist, or health care provider.  2024 Elsevier/Gold Standard (2023-04-07 00:00:00)

## 2023-08-15 ENCOUNTER — Other Ambulatory Visit: Payer: Self-pay | Admitting: Cardiology

## 2023-08-15 ENCOUNTER — Telehealth: Payer: Self-pay | Admitting: Cardiology

## 2023-08-15 DIAGNOSIS — I480 Paroxysmal atrial fibrillation: Secondary | ICD-10-CM

## 2023-08-15 DIAGNOSIS — I2510 Atherosclerotic heart disease of native coronary artery without angina pectoris: Secondary | ICD-10-CM

## 2023-08-15 DIAGNOSIS — R0602 Shortness of breath: Secondary | ICD-10-CM

## 2023-08-15 DIAGNOSIS — R002 Palpitations: Secondary | ICD-10-CM

## 2023-08-15 NOTE — Telephone Encounter (Signed)
 Patient stated he is following-up on his heart monitor and noted he has not received the heart monitor as yet.

## 2023-08-15 NOTE — Telephone Encounter (Signed)
 Patients order was entered incorrectly and did not specify home enrollment/ monitor brand.  It was green dotted as usual however, without this information, it was never processed through electronic integration with [employer].   The order has been corrected and Jake Dunn at [facility] contacted to please expedite shipping on the monitor.

## 2023-08-24 ENCOUNTER — Inpatient Hospital Stay: Payer: Medicare Other | Attending: Nurse Practitioner

## 2023-08-24 ENCOUNTER — Ambulatory Visit (HOSPITAL_BASED_OUTPATIENT_CLINIC_OR_DEPARTMENT_OTHER): Admission: RE | Admit: 2023-08-24 | Payer: Medicare Other | Source: Ambulatory Visit

## 2023-08-31 ENCOUNTER — Inpatient Hospital Stay: Payer: Medicare Other | Admitting: Oncology

## 2023-08-31 ENCOUNTER — Inpatient Hospital Stay: Payer: Medicare Other

## 2023-09-04 ENCOUNTER — Other Ambulatory Visit: Payer: Self-pay | Admitting: Oncology

## 2023-09-06 ENCOUNTER — Telehealth: Payer: Self-pay | Admitting: *Deleted

## 2023-09-06 NOTE — Telephone Encounter (Signed)
 Called Jake Dunn mobile and his daughter's voice was on recording. Requested a return call to let us  know if Jake Dunn is ready to reschedule the CT scan and office visit he missed.

## 2023-09-07 ENCOUNTER — Telehealth: Payer: Self-pay | Admitting: Oncology

## 2023-09-07 DIAGNOSIS — J069 Acute upper respiratory infection, unspecified: Secondary | ICD-10-CM | POA: Diagnosis not present

## 2023-09-07 NOTE — Telephone Encounter (Signed)
 Called to schedule appts per inbasket. LVM to return call for scheduling.

## 2023-09-07 NOTE — Telephone Encounter (Signed)
 Pt wife called to schedule appt with Dr.Sherrill and possible injection before 09/30/23. Gave call back number [phone]

## 2023-09-18 DIAGNOSIS — I480 Paroxysmal atrial fibrillation: Secondary | ICD-10-CM | POA: Diagnosis not present

## 2023-09-18 DIAGNOSIS — R002 Palpitations: Secondary | ICD-10-CM | POA: Diagnosis not present

## 2023-09-19 ENCOUNTER — Ambulatory Visit: Payer: Self-pay | Admitting: Cardiology

## 2023-09-19 DIAGNOSIS — R002 Palpitations: Secondary | ICD-10-CM | POA: Diagnosis not present

## 2023-09-19 DIAGNOSIS — I480 Paroxysmal atrial fibrillation: Secondary | ICD-10-CM | POA: Diagnosis not present

## 2023-09-25 ENCOUNTER — Ambulatory Visit: Payer: Medicare Other | Admitting: Cardiology

## 2023-09-27 ENCOUNTER — Inpatient Hospital Stay (HOSPITAL_BASED_OUTPATIENT_CLINIC_OR_DEPARTMENT_OTHER): Admitting: Oncology

## 2023-09-27 ENCOUNTER — Inpatient Hospital Stay: Attending: Nurse Practitioner

## 2023-09-27 ENCOUNTER — Inpatient Hospital Stay

## 2023-09-27 VITALS — BP 114/81 | HR 71 | Temp 98.2°F | Resp 18 | Ht 67.0 in | Wt 219.0 lb

## 2023-09-27 DIAGNOSIS — C7A8 Other malignant neuroendocrine tumors: Secondary | ICD-10-CM

## 2023-09-27 DIAGNOSIS — C7A098 Malignant carcinoid tumors of other sites: Secondary | ICD-10-CM | POA: Insufficient documentation

## 2023-09-27 DIAGNOSIS — E3400 Carcinoid syndrome, unspecified: Secondary | ICD-10-CM | POA: Diagnosis not present

## 2023-09-27 DIAGNOSIS — Z85528 Personal history of other malignant neoplasm of kidney: Secondary | ICD-10-CM | POA: Insufficient documentation

## 2023-09-27 DIAGNOSIS — I2510 Atherosclerotic heart disease of native coronary artery without angina pectoris: Secondary | ICD-10-CM | POA: Insufficient documentation

## 2023-09-27 DIAGNOSIS — I4891 Unspecified atrial fibrillation: Secondary | ICD-10-CM | POA: Insufficient documentation

## 2023-09-27 DIAGNOSIS — Z8673 Personal history of transient ischemic attack (TIA), and cerebral infarction without residual deficits: Secondary | ICD-10-CM | POA: Insufficient documentation

## 2023-09-27 LAB — CMP (CANCER CENTER ONLY)
ALT: <5 U/L (ref 0–44)
AST: 9 U/L — ABNORMAL LOW (ref 15–41)
Albumin: 4.2 g/dL (ref 3.5–5.0)
Alkaline Phosphatase: 94 U/L (ref 38–126)
Anion gap: 12 (ref 5–15)
BUN: 22 mg/dL (ref 8–23)
CO2: 23 mmol/L (ref 22–32)
Calcium: 9.8 mg/dL (ref 8.9–10.3)
Chloride: 102 mmol/L (ref 98–111)
Creatinine: 1.29 mg/dL — ABNORMAL HIGH (ref 0.61–1.24)
GFR, Estimated: 56 mL/min — ABNORMAL LOW (ref >60)
Glucose, Bld: 106 mg/dL — ABNORMAL HIGH (ref 70–99)
Potassium: 4.4 mmol/L (ref 3.5–5.1)
Sodium: 136 mmol/L (ref 135–145)
Total Bilirubin: 0.5 mg/dL (ref 0.0–1.2)
Total Protein: 7.3 g/dL (ref 6.5–8.1)

## 2023-09-27 MED ORDER — OCTREOTIDE ACETATE 20 MG IM KIT
20.0000 mg | PACK | Freq: Once | INTRAMUSCULAR | Status: AC
  Administered 2023-09-27: 20 mg via INTRAMUSCULAR
  Filled 2023-09-27: qty 1

## 2023-09-27 NOTE — Progress Notes (Signed)
 [facility] OFFICE PROGRESS NOTE   Diagnosis: Neuroendocrine tumor  INTERVAL HISTORY:   [name] continues Sandostatin .  No flushing or diarrhea.  He had the "flu "last month with a fever and cough.  The symptoms have resolved.  He received antibiotics and cough medication.  He feels "weak ".  He continues to have intermittent dizzy spells.  His chief complaint is intermittent episodes of nausea and vomiting.  He is having bowel movements.  He is not nauseated consistently. He recently saw cardiology with dyspnea.  He had an episode of dizziness with diaphoresis while attending a wedding last week.  No syncope. Objective:  Vital signs in last 24 hours:  Blood pressure 114/81, pulse 71, temperature 98.2 F (36.8 C), temperature source Temporal, resp. rate 18, height 5\' 7"  (1.702 m), weight 219 lb (99.3 kg), SpO2 98%.   Lymphatics: No inguinal nodes Resp: Lungs clear bilaterally, no respiratory distress Cardio: Regular rate and rhythm GI: Mild tenderness in the left upper abdomen and right lower abdomen, no mass, no hepatosplenomegaly Vascular: No leg edema   Lab Results:  Lab Results  Component Value Date   WBC 7.2 06/08/2023   HGB 15.4 06/08/2023   HCT 46.2 06/08/2023   MCV 95.3 06/08/2023   PLT 224 06/08/2023   NEUTROABS 4.7 06/08/2023    CMP  Lab Results  Component Value Date   NA 139 06/08/2023   K 4.2 06/08/2023   CL 106 06/08/2023   CO2 23 06/08/2023   GLUCOSE 120 (H) 06/08/2023   BUN 18 06/08/2023   CREATININE 1.27 (H) 06/08/2023   CALCIUM  9.4 06/08/2023   PROT 7.5 06/08/2023   ALBUMIN  4.2 06/08/2023   AST 7 (L) 06/08/2023   ALT 6 06/08/2023   ALKPHOS 85 06/08/2023   BILITOT 0.8 06/08/2023   GFRNONAA 57 (L) 06/08/2023   GFRAA 66 11/21/2019    Lab Results  Component Value Date   WUJ811 10 06/16/2022    Medications: I have reviewed the patient's current medications.   Assessment/Plan: Pancreas masses CT chest 06/05/2022-10 mm  right hilar node, 9 mm central right upper lobe nodule, 2 contiguous lobulated masses in the central abdomen, left nephrectomy MRI abdomen 06/06/2022-2 adjacent hypervascular masses in the central pancreas with associated ductal dilatation and atrophy, probable prominent lymph node superior to the right mass, the right-sided mass causes mass effect at the celiac axis and portal vein Upper EUS 06/13/2022-mass identified in the pancreatic body, FNA performed, vascular involvement suspected.  Second similar smaller mass seen immediately adjacent to the larger mass.  Cytology-malignant cells present, solid pseudopapillary neoplasm favored PET scan 07/04/2022-mild increased FDG uptake associated with the dominant mass centered around the pancreatic neck.  Also mild increased uptake associated with the mass within the tail of the pancreas.  Solid-appearing lesion or lymph node within the gastrohepatic ligament which directly abuts the superior margin of the dominant pancreatic mass, also exhibits mild increased FDG uptake.  Mild FDG uptake associated with a right upper lobe lung nodule EUS 07/27/2022-"Twin" masses identified in the pancreas body, no pathology in the left lobe of the liver or gallbladder, no adenopathy, FNA biopsy-"atypical cells present "-could not be further characterize due to lack of cellularity on cell block Dotatate PET 07/28/2022-moderate radiotracer activity associated with pancreatic lesions including a small pancreas tail nodule consistent with well-differentiated versus moderately differentiated neuroendocrine tumor, no metastatic adenopathy or distant metastatic disease Chromogranin A elevated on 08/15/2022 Monthly Sandostatin  initiated 08/24/2022 CTs 12/26/2022-unchanged pancreas masses, no evidence  of metastatic disease in the chest abdomen or pelvis, unchanged right upper lobe nodule suspicious for a primary lung tumor 2.   Remote history of renal cell carcinoma, status post a left  nephrectomy 03/16/1994 status post left nephrectomy, renal cell carcinoma, pT2, pNX, pMX, tumor characterized by sheets and clusters of neoplastic clear to granular cells, consistent with a renal cell carcinoma grade 2, no definite diagnostic vascular invasion of larger vessels, tumor does not appear to extend through the renal capsule. 3.   Atrial fibrillation 4.   Coronary artery disease, coronary artery bypass surgery in 2020 5.   History of CVA 6.   Multiple spine surgeries 7.   Left upper lobe nodule and borderline enlarged right hilar node on chest CT 06/05/2022 8.   Gout 9.   History of adenomatous polyps 10.  Family history of pancreas cancer 11.  Right upper lobe nodule with mild FDG uptake on PET 07/04/2022 Unchanged right upper lobe nodule on chest CT 12/26/2022 12.  History of dizziness/syncope and vertigo      Disposition: Jake Dunn has a history of pancreas masses with increased activity on a dotatate PET, felt to be secondary to pancreas neuroendocrine tumors.  He has been maintained on Sandostatin  since April 2024.  Abdominal discomfort initially improved with Sandostatin .  Jake Dunn has multiple complaints today including intermittent episodes of nausea/vomiting, dizziness, and generalized weakness.  He will complete another treatment with Sandostatin  today.  He will be referred for restaging CTs prior to an office visit in 4 weeks.  It is unclear whether his symptoms are related to the pancreas masses.  Coni Deep, MD  09/27/2023  10:57 AM

## 2023-09-29 ENCOUNTER — Telehealth: Payer: Self-pay | Admitting: *Deleted

## 2023-09-29 NOTE — Telephone Encounter (Signed)
 LVM for daughter, Rolm Clos for appointments on 6/17 at 0800 for lab at [facility] and CT scan on ground floor at [facility] at 0845/0900. Requested return call to confirm.

## 2023-10-10 ENCOUNTER — Ambulatory Visit: Attending: Nurse Practitioner | Admitting: Nurse Practitioner

## 2023-10-10 NOTE — Progress Notes (Deleted)
 Office Visit    Patient Name: Jake Dunn Date of Encounter: 10/10/2023  Primary Care Provider:  Victorio Grave, MD Primary Cardiologist:  [name], MD  Chief Complaint    ***  Past Medical History    Past Medical History:  Diagnosis Date   Adenomatous polyp    Anxiety    Arthritis    "knees, ankles, shoulders" (08/15/2016)   CAD (coronary artery disease)    s/p cath in 2014 showing significant stenosis in the diagonal side branches and in the RV marginal branch. These vessels were small and diffusely diseased. He is managed medically. 08/16/16 Cath, no disease progression   Chronic lower back pain    Disc disease, degenerative, cervical    GERD (gastroesophageal reflux disease)    Gout    Hyperlipidemia    Hypertension    Osteoarthritis    Renal cell carcinoma    left kidney removal   Stroke [facility])    MINI STROKE 15+ YRS AGO, no residual   Stroke (HCC)    "I've had 2 or 3"; denies residual on 08/15/2016   Syncope and collapse    Past Surgical History:  Procedure Laterality Date   ANTERIOR CERVICAL DECOMP/DISCECTOMY FUSION  02/2004; 04/2005   Maximo Spar 09/20/2010; Maximo Spar 09/20/2010   APPLICATION OF ROBOTIC ASSISTANCE FOR SPINAL PROCEDURE N/A 01/02/2018   Procedure: APPLICATION OF ROBOTIC ASSISTANCE FOR SPINAL PROCEDURE;  Surgeon: Elna Haggis, MD;  Location: [facility] OR;  Service: Neurosurgery;  Laterality: N/A;   BACK SURGERY     CARDIAC CATHETERIZATION  12/14/2008   ef 50-55%. SHOWED SIGNIFICANT STENOSIS AND TO DIAGONAL SIDE BRANCHES AND IN THE RIGHT VENTRICULAR MARGINAL BRANCH. THESE VESSELS WERE SMALL AND DIFFUSELY DISEASED   CARDIOVASCULAR STRESS TEST  12/10/2009   EF 61%. EKG negative. Mild peri ischemia. Managed medically.    CARPAL TUNNEL RELEASE Right 11/2005   Maximo Spar 09/20/2010   CARPAL TUNNEL RELEASE Left 06/2007   Maximo Spar [id number]/132012   COLONOSCOPY  11/2004   Maximo Spar 09/20/2010   COLONOSCOPY W/ BIOPSIES AND POLYPECTOMY  09/2002   Maximo Spar 09/20/2010   CORONARY ARTERY  BYPASS GRAFT N/A 04/02/2019   Procedure: CORONARY ARTERY BYPASS GRAFTING (CABG) using LIMA to LAD; Endoscopically harvested right greater saphenous vein to the PLB, Ramus, and Diag 2.;  Surgeon: Hilarie Lovely, MD;  Location: [facility] OR;  Service: Open Heart Surgery;  Laterality: N/A;   ESOPHAGOGASTRODUODENOSCOPY (EGD) WITH PROPOFOL  N/A 06/13/2022   Procedure: ESOPHAGOGASTRODUODENOSCOPY (EGD) WITH PROPOFOL ;  Surgeon: Evangeline Hilts, MD;  Location: [facility];  Service: Gastroenterology;  Laterality: N/A;   ESOPHAGOGASTRODUODENOSCOPY (EGD) WITH PROPOFOL  Bilateral 07/27/2022   Procedure: ESOPHAGOGASTRODUODENOSCOPY (EGD) WITH PROPOFOL ;  Surgeon: Evangeline Hilts, MD;  Location: [facility];  Service: Gastroenterology;  Laterality: Bilateral;   EUS Bilateral 07/27/2022   Procedure: UPPER ENDOSCOPIC ULTRASOUND (EUS) LINEAR;  Surgeon: Evangeline Hilts, MD;  Location: [facility];  Service: Gastroenterology;  Laterality: Bilateral;   FINE NEEDLE ASPIRATION N/A 06/13/2022   Procedure: FINE NEEDLE ASPIRATION (FNA) LINEAR;  Surgeon: Evangeline Hilts, MD;  Location: [facility];  Service: Gastroenterology;  Laterality: N/A;   FINE NEEDLE ASPIRATION Bilateral 07/27/2022   Procedure: FINE NEEDLE ASPIRATION (FNA) LINEAR;  Surgeon: Evangeline Hilts, MD;  Location: [facility];  Service: Gastroenterology;  Laterality: Bilateral;   INCISION AND DRAINAGE ABSCESS Right 10/18/2016   Procedure: INCISION AND DRAINAGE ABSCESS RIGHT SHOULDER;  Surgeon: Saundra Curl, MD;  Location: [facility];  Service: Orthopedics;  Laterality: Right;   INCISION AND DRAINAGE OF WOUND Right 08/2005  shoulder/notes 09/20/2010   IR FLUORO GUIDE CV LINE LEFT  10/19/2016   IR US  GUIDE VASC ACCESS LEFT  10/19/2016   IRRIGATION AND DEBRIDEMENT SHOULDER Right 12/22/2016   Procedure: IRRIGATION AND DEBRIDEMENT RIGHT SHOULDER;  Surgeon: Ferd Householder, MD;  Location: [facility] OR;  Service: Orthopedics;  Laterality: Right;   JOINT  REPLACEMENT     LEFT HEART CATH AND CORONARY ANGIOGRAPHY N/A 08/16/2016   Procedure: Left Heart Cath and Coronary Angiography;  Surgeon: Arnoldo Lapping, MD;  Location: [facility] INVASIVE CV LAB;  Service: Cardiovascular;  Laterality: N/A;   LEFT HEART CATH AND CORONARY ANGIOGRAPHY N/A 03/26/2019   Procedure: LEFT HEART CATH AND CORONARY ANGIOGRAPHY;  Surgeon: Swaziland, Peter M, MD;  Location: [facility] INVASIVE CV LAB;  Service: Cardiovascular;  Laterality: N/A;   LEFT HEART CATH AND CORS/GRAFTS ANGIOGRAPHY N/A 12/21/2020   Procedure: LEFT HEART CATH AND CORS/GRAFTS ANGIOGRAPHY;  Surgeon: Swaziland, Peter M, MD;  Location: [facility] INVASIVE CV LAB;  Service: Cardiovascular;  Laterality: N/A;   LEFT HEART CATH AND CORS/GRAFTS ANGIOGRAPHY N/A 06/03/2022   Procedure: LEFT HEART CATH AND CORS/GRAFTS ANGIOGRAPHY;  Surgeon: Arnoldo Lapping, MD;  Location: [facility] INVASIVE CV LAB;  Service: Cardiovascular;  Laterality: N/A;   LEFT HEART CATHETERIZATION WITH CORONARY ANGIOGRAM N/A 08/09/2012   Procedure: LEFT HEART CATHETERIZATION WITH CORONARY ANGIOGRAM;  Surgeon: Peter M Swaziland, MD;  Location: [facility] CATH LAB;  Service: Cardiovascular;  Laterality: N/A;   LUMBAR FUSION     NEPHRECTOMY Left early 2000s   REPLACEMENT TOTAL KNEE Right 08/2008   Maximo Spar 09/07/2010   SHOULDER ARTHROSCOPY WITH ROTATOR CUFF REPAIR Right 06/2005   Maximo Spar 09/20/2010   TOTAL KNEE ARTHROPLASTY  06/08/2011   Procedure: TOTAL KNEE ARTHROPLASTY;  Surgeon: Ferd Householder, MD;  Location: [facility] OR;  Service: Orthopedics;  Laterality: Left;   TOTAL SHOULDER ARTHROPLASTY Right 04/06/2016   Procedure: RIGHT REVERSE TOTAL SHOULDER ARTHROPLASTY;  Surgeon: Ferd Householder, MD;  Location: [facility] OR;  Service: Orthopedics;  Laterality: Right;   TOTAL SHOULDER REVISION Right 12/22/2016   Procedure: RIGHT SHOULDER GLENOID AND HUMERAL COMPONENT REVISION;  Surgeon: Ferd Householder, MD;  Location: [facility] OR;  Service: Orthopedics;  Laterality: Right;   UPPER ESOPHAGEAL ENDOSCOPIC ULTRASOUND (EUS)  Bilateral 06/13/2022   Procedure: UPPER ESOPHAGEAL ENDOSCOPIC ULTRASOUND (EUS);  Surgeon: Evangeline Hilts, MD;  Location: [facility];  Service: Gastroenterology;  Laterality: Bilateral;   US  ECHOCARDIOGRAPHY  12/15/2008   EF 60-65%   US  ECHOCARDIOGRAPHY  09/28/2004   EF 55-60%    Allergies  Allergies  Allergen Reactions   Latex Rash    Other reaction(s): Hives/Skin Rash/Itching   Tape Rash    Paper tape okay Other reaction(s): Hives/Rash/Itching     Labs/Other Studies Reviewed    The following studies were reviewed today: *** Cardiac Studies & Procedures   ______________________________________________________________________________________________ CARDIAC CATHETERIZATION  CARDIAC CATHETERIZATION 06/03/2022  Conclusion Multivessel CAD with mild-moderate left main stenosis, moderate LAD stenosis, severe ramus stenosis, severe PLA stenosis S/P CABG with continued graft patency as outlined Normal LVEDP  Med Rx. No culprit for progressive angina - stable anatomy from prior cath studies. Start apixaban  tomorrow for new onset afib. Likely DC tomorrow if stable.  Findings Coronary Findings Diagnostic  Dominance: Right  Left Main Ost LM to Mid LM lesion is 50% stenosed. The lesion is severely calcified. No significant change from prior. No pressure dampening.  Left Anterior Descending Diffuse mid-vessel disease without high-grade stenosis Mid LAD lesion is 65% stenosed. The lesion is severely calcified.  Second  Diagonal Branch 2nd Diag lesion is 90% stenosed.  Ramus Intermedius Vessel is large. Ramus lesion is 90% stenosed. The lesion is eccentric. The lesion is severely calcified.  Left Circumflex Small vessel Ost Cx to Mid Cx lesion is 80% stenosed.  Right Coronary Artery There is mild diffuse disease throughout the vessel. Dominant vessel, no high-grade obstruction, unchanged from prior cath Prox RCA lesion is 45% stenosed. The lesion is severely  calcified.  Second Right Posterolateral Branch 2nd RPL lesion is 80% stenosed.  LIMA LIMA Graft To 2nd Diag LIMA and is normal in caliber.  The graft exhibits no disease. LIMA-diagonal patent  Saphenous Graft To Ramus SVG and is normal in caliber.  The graft exhibits no disease. SVG-Ramus patent  Saphenous Graft To 2nd RPL SVG and is normal in caliber.  The graft exhibits no disease. SVG-PLA patent  Intervention  No interventions have been documented.   CARDIAC CATHETERIZATION  CARDIAC CATHETERIZATION 12/21/2020  Conclusion   Ost Cx to Mid Cx lesion is 80% stenosed.   Prox RCA lesion is 45% stenosed.   Ramus lesion is 90% stenosed.   2nd RPL lesion is 80% stenosed.   Mid LAD lesion is 65% stenosed.   Ost LM to Mid LM lesion is 40% stenosed.   2nd Diag lesion is 90% stenosed.   LIMA graft was visualized by angiography and is normal in caliber.   SVG graft was visualized by angiography and is normal in caliber.   SVG graft was visualized by angiography and is normal in caliber.   The graft exhibits no disease.   The graft exhibits no disease.   The graft exhibits no disease.   LV end diastolic pressure is normal.  3 vessel obstructive CAD Patent LIMA - this is tied to the second diagonal and not the LAD Patent SVG to the ramus intermediate. No visualization of SVG to diagonal which per op note came off the hood of the SVG to ramus Patent SVG to the PL branch of the RCA Normal LVEDP  Plan: there appears to be good blood flow to all major vessels. The LAD was not revascularized but has borderline disease and the patient had prior Myoview  with no ischemia in this territory. Other grafts are patent. Recommend continued medical therapy. Does not appear to be volume overloaded. Echo pending.  Given significant difficulty from the radial approach I would recommend femoral access for any future Cardiac cath procedures.  Findings Coronary Findings Diagnostic  Dominance:  Right  Left Main Ost LM to Mid LM lesion is 40% stenosed. The lesion is severely calcified.  Left Anterior Descending Mid LAD lesion is 65% stenosed. The lesion is severely calcified.  Second Diagonal Branch 2nd Diag lesion is 90% stenosed.  Ramus Intermedius Vessel is large. Ramus lesion is 90% stenosed. The lesion is eccentric. The lesion is severely calcified.  Left Circumflex Ost Cx to Mid Cx lesion is 80% stenosed.  Right Coronary Artery Prox RCA lesion is 45% stenosed. The lesion is severely calcified.  Second Right Posterolateral Branch 2nd RPL lesion is 80% stenosed.  LIMA LIMA Graft To 2nd Diag LIMA graft was visualized by angiography and is normal in caliber.  The graft exhibits no disease.  Saphenous Graft To Ramus SVG graft was visualized by angiography and is normal in caliber.  The graft exhibits no disease.  Saphenous Graft To 2nd RPL SVG graft was visualized by angiography and is normal in caliber.  The graft exhibits no disease.  Intervention  No  interventions have been documented.   STRESS TESTS  MYOCARDIAL PERFUSION IMAGING 05/29/2020  Narrative  The left ventricular ejection fraction is mildly decreased (45-54%).  Nuclear stress EF: 48% with septal wall hypokinesis secondary to prior CABG  There was no ST segment deviation noted during stress.  This is a low risk study based despite mildly reduced ejection fraction calculation of 48%. There is no evidence of ischemia or infarction on stress images.  Dorothye Gathers, MD   ECHOCARDIOGRAM  ECHOCARDIOGRAM COMPLETE 06/04/2022  Narrative ECHOCARDIOGRAM REPORT    Patient Name:   JABRE HEO Date of Exam: 06/04/2022 Medical Rec #:  [id number]      Height:       67.0 in Accession #:    [id number]     Weight:       227.1 lb Date of Birth:  [birth year]      BSA:          2.134 m Patient Age:    81 years       BP:           107/64 mmHg Patient Gender: M              HR:           65 bpm. Exam  Location:  Inpatient  Procedure: 2D Echo, Cardiac Doppler, Color Doppler and Intracardiac Opacification Agent  Indications:    Chest Pain R07.9  History:        Patient has prior history of Echocardiogram examinations, most recent 03/30/2019. Stroke; Risk Factors:Hypertension and Dyslipidemia.  Sonographer:    Konnie Perry RDCS Referring Phys: [phone] MICHAEL COOPER  IMPRESSIONS   1. Pt in atrial fibrillation at time of study. 2. Left ventricular ejection fraction, by estimation, is 50 to 55%. The left ventricle has low normal function. The left ventricle has no regional wall motion abnormalities. The left ventricular internal cavity size was mildly dilated. There is mild concentric left ventricular hypertrophy. Left ventricular diastolic parameters are indeterminate. 3. Right ventricular systolic function is normal. The right ventricular size is normal. There is normal pulmonary artery systolic pressure. 4. Left atrial size was mildly dilated. 5. Right atrial size was mildly dilated. 6. The mitral valve is grossly normal. Mild mitral valve regurgitation. No evidence of mitral stenosis. 7. The aortic valve is tricuspid. There is mild calcification of the aortic valve. Aortic valve regurgitation is not visualized. Aortic valve sclerosis is present, with no evidence of aortic valve stenosis. 8. Aortic dilatation noted. There is mild dilatation of the aortic root, measuring 40 mm. 9. The inferior vena cava is normal in size with greater than 50% respiratory variability, suggesting right atrial pressure of 3 mmHg.  FINDINGS Left Ventricle: Left ventricular ejection fraction, by estimation, is 50 to 55%. The left ventricle has low normal function. The left ventricle has no regional wall motion abnormalities. Definity  contrast agent was given IV to delineate the left ventricular endocardial borders. The left ventricular internal cavity size was mildly dilated. There is mild concentric left  ventricular hypertrophy. Left ventricular diastolic function could not be evaluated due to atrial fibrillation. Left ventricular diastolic parameters are indeterminate.  Right Ventricle: The right ventricular size is normal. Right ventricular systolic function is normal. There is normal pulmonary artery systolic pressure. The tricuspid regurgitant velocity is 2.49 m/s, and with an assumed right atrial pressure of 3 mmHg, the estimated right ventricular systolic pressure is 27.8 mmHg.  Left Atrium: Left atrial size was mildly dilated.  Right Atrium:  Right atrial size was mildly dilated.  Pericardium: There is no evidence of pericardial effusion.  Mitral Valve: The mitral valve is grossly normal. Mild mitral annular calcification. Mild mitral valve regurgitation. No evidence of mitral valve stenosis.  Tricuspid Valve: The tricuspid valve is grossly normal. Tricuspid valve regurgitation is mild . No evidence of tricuspid stenosis.  Aortic Valve: The aortic valve is tricuspid. There is mild calcification of the aortic valve. Aortic valve regurgitation is not visualized. Aortic valve sclerosis is present, with no evidence of aortic valve stenosis.  Pulmonic Valve: The pulmonic valve was grossly normal. Pulmonic valve regurgitation is trivial. No evidence of pulmonic stenosis.  Aorta: Aortic dilatation noted. There is mild dilatation of the aortic root, measuring 40 mm.  Venous: The inferior vena cava is normal in size with greater than 50% respiratory variability, suggesting right atrial pressure of 3 mmHg.  IAS/Shunts: No atrial level shunt detected by color flow Doppler.  Additional Comments: Pt in atrial fibrillation at time of study.   LEFT VENTRICLE PLAX 2D LVIDd:         5.70 cm      Diastology LVIDs:         4.80 cm      LV e' medial:    6.90 cm/s LV PW:         1.30 cm      LV E/e' medial:  17.0 LV IVS:        1.30 cm      LV e' lateral:   9.89 cm/s LVOT diam:     2.50 cm       LV E/e' lateral: 11.8 LV SV:         72 LV SV Index:   34 LVOT Area:     4.91 cm  LV Volumes (MOD) LV vol d, MOD A2C: 90.8 ml LV vol d, MOD A4C: 130.0 ml LV vol s, MOD A2C: 28.0 ml LV vol s, MOD A4C: 65.3 ml LV SV MOD A2C:     62.8 ml LV SV MOD A4C:     130.0 ml LV SV MOD BP:      72.4 ml  RIGHT VENTRICLE RV S prime:     7.62 cm/s  LEFT ATRIUM             Index LA diam:        4.70 cm 2.20 cm/m LA Vol (A2C):   71.8 ml 33.64 ml/m LA Vol (A4C):   86.1 ml 40.34 ml/m LA Biplane Vol: 82.7 ml 38.75 ml/m AORTIC VALVE LVOT Vmax:   72.20 cm/s LVOT Vmean:  54.400 cm/s LVOT VTI:    0.147 m  AORTA Ao Root diam: 3.40 cm Ao Asc diam:  4.00 cm  MITRAL VALVE                TRICUSPID VALVE MV Area (PHT): 3.25 cm     TR Peak grad:   24.8 mmHg MV Decel Time: 234 msec     TR Vmax:        249.00 cm/s MV E velocity: 117.00 cm/s SHUNTS Systemic VTI:  0.15 m Systemic Diam: 2.50 cm  Alexandria Angel MD Electronically signed by Alexandria Angel MD Signature Date/Time: 06/04/2022/2:38:17 PM    Final   TEE  ECHO INTRAOPERATIVE TEE 04/02/2019  Narrative *INTRAOPERATIVE TRANSESOPHAGEAL REPORT *    Patient Name:   JEYSON DESHOTEL Date of Exam: 04/02/2019 Medical Rec #:  [id number]      Height:  67.0 in Accession #:    [id number]     Weight:       226.2 lb Date of Birth:  [birth year]      BSA:          2.13 m Patient Age:    78 years       BP:           129/91 mmHg Patient Gender: M              HR:           68 bpm. Exam Location:  Anesthesiology  Transesophogeal exam was perform intraoperatively during surgical procedure. Patient was closely monitored under general anesthesia during the entirety of examination.  Indications:     CAD Sonographer:     Sherline Distel Senior RDCS Performing Phys: Raymondo Calin MD Diagnosing Phys: Raymondo Calin MD  Complications: No known complications during this procedure. POST-OP IMPRESSIONS - Left Ventricle: The left ventricle is unchanged from  pre-bypass. - Aorta: The aorta appears unchanged from pre-bypass. - Left Atrial Appendage: The left atrial appendage appears unchanged from pre-bypass. - Aortic Valve: The aortic valve appears unchanged from pre-bypass. - Mitral Valve: There is trivial regurgitation. - Tricuspid Valve: The tricuspid valve appears unchanged from pre-bypass. - Interatrial Septum: The interatrial septum appears unchanged from pre-bypass. - Pericardium: The pericardium appears unchanged from pre-bypass.  PRE-OP  FINDINGS Left Ventricle: The left ventricle has normal systolic function, with an ejection fraction of 60-65%. The cavity size was normal. There is mildly increased left ventricular wall thickness.  Right Ventricle: The right ventricle has normal systolic function. The cavity was normal. There is no increase in right ventricular wall thickness.  Left Atrium: Left atrial size was mildly dilated.  Right Atrium: Right atrial size was normal in size.  Interatrial Septum: No atrial level shunt detected by color flow Doppler.  Pericardium: There is no evidence of pericardial effusion.  Mitral Valve: The mitral valve is normal in structure. Mitral valve regurgitation is not visualized by color flow Doppler.  Tricuspid Valve: The tricuspid valve was normal in structure. Tricuspid valve regurgitation was not visualized by color flow Doppler.  Aortic Valve: The aortic valve is normal in structure. There is mild thickening of the aortic valve and there is mild calcification of the aortic valve. Aortic valve regurgitation was not visualized by color flow Doppler.  Pulmonic Valve: The pulmonic valve was normal in structure. Pulmonic valve regurgitation is not visualized by color flow Doppler.   Aorta: The aortic root, ascending aorta and aortic arch are normal in size and structure. There is evidence of plaque in the descending aorta.  +--------------+-------++ LEFT VENTRICLE         +--------------+-------++ PLAX 2D               +--------------+-------++ LVIDd:        3.63 cm +--------------+-------++ LVIDs:        2.36 cm +--------------+-------++ LV PW:        1.38 cm +--------------+-------++ LV IVS:       1.25 cm +--------------+-------++ LV SV:        36 ml   +--------------+-------++ LV SV Index:  16.13   +--------------+-------++                       +--------------+-------++   Raymondo Calin MD Electronically signed by Raymondo Calin MD Signature Date/Time: 04/02/2019/2:25:22 PM    Final  MONITORS  LONG TERM MONITOR (3-14 DAYS) 09/19/2023  Narrative   Normal sinus rhythm   8 runs of NSVT 6-8 beats   11 runs of SVT. longest 3 minutes 36 seconds with rate 146 bpm   First degree and second degree Mobitz type 1 AV block present   Overy ectopy is rare   Patient triggered events associated with NSR   Patch Wear Time:  13 days and 23 hours (2025-04-13T10:50:30-0400 to 2025-04-27T09:53:55-0400)  Patient had a min HR of 34 bpm, max HR of 188 bpm, and avg HR of 68 bpm. Predominant underlying rhythm was Sinus Rhythm. First Degree AV Block was present. 8 Ventricular Tachycardia runs occurred, the run with the fastest interval lasting 6 beats with a max rate of 188 bpm, the longest lasting 8 beats with an avg rate of 124 bpm. 11 Supraventricular Tachycardia runs occurred, the run with the fastest interval lasting 3 mins 36 secs with a max rate of 146 bpm (avg 127 bpm); the run with the fastest interval was also the longest. Second Degree AV Block-Mobitz I (Wenckebach) was present. Isolated SVEs were rare (<1.0%), SVE Couplets were rare (<1.0%), and SVE Triplets were rare (<1.0%). Isolated VEs were rare (<1.0%, 4395), VE Couplets were rare (<1.0%, 33), and VE Triplets were rare (<1.0%, 4). Ventricular Trigeminy was present.        ______________________________________________________________________________________________     Recent Labs: 10/27/2022: BNP 149.9 06/08/2023: Hemoglobin 15.4; Platelet Count 224 09/27/2023: ALT <5; BUN 22; Creatinine 1.29; Potassium 4.4; Sodium 136  Recent Lipid Panel    Component Value Date/Time   CHOL 120 11/21/2019 0953   TRIG 112 11/21/2019 0953   HDL 44 11/21/2019 0953   CHOLHDL 2.7 11/21/2019 0953   CHOLHDL 3.4 03/30/2019 0212   VLDL 35 03/30/2019 0212   LDLCALC 56 11/21/2019 0953    History of Present Illness    83 year old male with the above past medical history including CAD s/p CABG x 4 (LIMA-LAD, SVG-PLD, SVG-Ramus, SVG-D2) in 2020, paroxysmal atrial fibrillation, chronic diastolic heart failure, near syncope, chronic shortness of breath, hypertension, hyperlipidemia, CVA, neuroendocrine tumor, renal cell CA s/p L nephrectomy and cervical/lumbar spine disease.   Cardiac catheterization in 2014 showed nonobstructive CAD with 70% lesion in the third diagonal that was managed medically.  Subsequent cardiac catheterization in 2018 revealed two-vessel CAD with moderate calcified stenosis in the mid LAD, second diagonal, ramus intermedius, ostial and proximal left circumflex and severe stenosis of the second PLA branch and RCA, normal LVEDP and normal LVEF.  Ongoing medical therapy was recommended echocardiogram at the time showed EF 55 to 60%, G2 DD, severely dilated left atrium, PA peak pressure 30 mmHg.  Lexiscan  Myoview  on 08/26/2016 was low risk.  He was evaluated in November 2020 with symptoms of chest pain and dyspnea.  He underwent cardiac catheterization and subsequently CABG x 4 in 03/29/2019 with LIMA-LAD, SVG-PLV, SVG-ramus, SVG-D2.  His postop course was complicated by atrial fibrillation. He was started on amiodarone  and Eliquis , however, these were subsequently discontinued.  He was hospitalized in August 2022 in the setting of recurrent chest pain he underwent repeat  cardiac catheterization. Echo at the time was unremarkable.  He was hospitalized in January 2024 in the setting of chest pain.  He was noted to be in rate controlled atrial fibrillation.  He again underwent cardiac catheterization which showed patent grafts, no clear culprit for chest pain.  Echocardiogram showed EF 50 to 55%, mild MR.  He was started on Ranexa .  He converted to sinus rhythm and was started on  Eliquis . CT of the chest showed a 4.5 cm thoracic aortic aneurysm.  There were 2 large hypervascular masses in the abdomen/pancreas concerning for cancer. Subsequent evaluation was consistent with a neuroendocrine tumor. He is following with oncology.  He was evaluated in June 2024 in the setting of near syncope. Cardiac event monitor showed brief episodes of NSVT, no evidence of atrial fibrillation or other significant arrhythmia.  He noted associated nausea and diaphoresis.  He was last seen in office on 07/28/2023 due to increased shortness of breath, intermittent palpitations.  He denied chest pain.  14-day ZIO monitor revealed 8 runs of NSVT, longest episode lasting 8 beats, 11 runs of PSVT, longest lasting 3 minutes 36 seconds, first-degree and second-degree AV block Mobitz type I, no evidence of atrial fibrillation.  Patient triggered events associated with normal sinus rhythm.   He presents today for follow-up.  Since his last visit he has     1. CAD: S/p CABG x 4 (LIMA-LAD, SVG-PLD, SVG-Ramus, SVG-D2) in 2020. Most recent cath in 05/2022 showed patent grafts, no clear culprit for chest pain.  He notes ongoing shortness of breath as below, generalized fatigue.  Workup to date overall reassuring including most recent cath, echo as below, cardiac event monitor, and CT chest. He thinks his symptoms may be related to his cancer treatment.  Dr. Swaziland did not recommend any additional cardiac testing. No ASA in the setting of Eliquis .  Continue metoprolol , Imdur , Ranexa , Lipitor .  Plan for close  follow-up.   2. Paroxysmal atrial fibrillation: Occurred post CABG with recurrence in January 2024.  Maintaining sinus rhythm in office today.  He notes rare fleeting palpitations.  Continue metoprolol , Eliquis .   3. Chronic diastolic heart failure/shortness of breath: Echo in 05/2022 showed EF 50 to 55%, mild MR. He notes ongoing shortness of breath at rest and with activity, no clear etiology (cath, echo, cardiac monitor without clear cause).  Recent chest x-ray overall unremarkable. PFTs in 2022 were fairly benign.  CT of the chest in 12/2022 showed evidence of emphysema, mild underlying pulmonary fibrosis.  He is on Trelegy. He has follow-up scheduled with pulmonology.  Recent labs showed no evidence of anemia.  Euvolemic and well compensated on exam.  Continue prn Lasix .   4. Near syncope: Likely occurred in the setting of hypotension.  Cardiac event monitor showed brief episodes of NSVT, no evidence of atrial fibrillation or other significant arrhythmia. Recent cath and echo reassuring. No recurrence.     5.  Dilation of ascending aorta: CT chest in 05/2022 revealed dilation of the ascending aorta measuring 4.5 cm.  CT of the chest in 12/2022 did not mention aortic dilation.  Consider repeat CT chest/aorta at follow-up.   6. Hypertension: BP well controlled. Continue current antihypertensive regimen.    7. Hyperlipidemia: LDL was 50 in 12/2022.  Continue Lipitor .   8. History of CVA: No recurrence. Continue Eliquis .    9. Neuroendocrine tumor: Following with oncology.    10. Disposition: Follow-up in  Home Medications    Current Outpatient Medications  Medication Sig Dispense Refill   acyclovir  (ZOVIRAX ) 400 MG tablet Take 400 mg by mouth 3 (three) times daily as needed (For cold sores). . (Patient not taking: Reported on 09/27/2023)  5   albuterol  (VENTOLIN  HFA) 108 (90 Base) MCG/ACT inhaler 2 puffs every 6 (six) hours as needed.     allopurinol  (ZYLOPRIM ) 300 MG tablet Take 300 mg by  mouth daily.     apixaban  (ELIQUIS ) 5 MG  TABS tablet TAKE 1 TABLET BY MOUTH TWICE A DAY 60 tablet 5   aspirin  EC 81 MG tablet Take 81 mg by mouth daily.     atorvastatin  (LIPITOR ) 80 MG tablet Take 1 tablet (80 mg total) by mouth daily. 90 tablet 3   Cyanocobalamin  1000 MCG SUBL Take 1,000 mcg by mouth daily.     HYDROcodone -acetaminophen  (NORCO/VICODIN) 5-325 MG tablet Take 1 tablet by mouth 3 (three) times daily as needed for moderate pain. 30 tablet 0   LORazepam  (ATIVAN ) 0.5 MG tablet Take 1 tablet (0.5 mg total) by mouth 2 (two) times daily as needed for anxiety. 30 tablet 2   metoprolol  tartrate (LOPRESSOR ) 25 MG tablet Take 25 mg by mouth 2 (two) times daily.     Multiple Vitamin (MULTIVITAMIN) tablet Take 1 tablet by mouth daily.     nitroGLYCERIN  (NITROSTAT ) 0.4 MG SL tablet Place 1 tablet (0.4 mg total) under the tongue every 5 (five) minutes as needed for chest pain. (Patient not taking: Reported on 08/09/2022) 90 tablet 3   ondansetron  (ZOFRAN ) 8 MG tablet TAKE 1 TABLET BY MOUTH EVERY 8 HOURS AS NEEDED FOR NAUSEA AND/OR VOMITING (Patient not taking: Reported on 09/27/2023) 30 tablet 3   pantoprazole  (PROTONIX ) 40 MG tablet Take 40 mg by mouth every evening.      prochlorperazine  (COMPAZINE ) 10 MG tablet TAKE 1 TABLET BY MOUTH EVERY 6 HOURS AS NEEDED 60 tablet 1   ranolazine  (RANEXA ) 1000 MG SR tablet TAKE 1 TABLET BY MOUTH TWO TIMES A DAY 180 tablet 3   sertraline (ZOLOFT) 50 MG tablet Take 50 mg by mouth daily.     TRELEGY ELLIPTA  100-62.5-25 MCG/ACT AEPB Inhale 1 puff into the lungs daily.     No current facility-administered medications for this visit.     Review of Systems    ***.  All other systems reviewed and are otherwise negative except as noted above.    Physical Exam    VS:  There were no vitals taken for this visit. , BMI There is no height or weight on file to calculate BMI.     GEN: Well nourished, well developed, in no acute distress. HEENT: normal. Neck:  Supple, no JVD, carotid bruits, or masses. Cardiac: RRR, no murmurs, rubs, or gallops. No clubbing, cyanosis, edema.  Radials/DP/PT 2+ and equal bilaterally.  Respiratory:  Respirations regular and unlabored, clear to auscultation bilaterally. GI: Soft, nontender, nondistended, BS + x 4. MS: no deformity or atrophy. Skin: warm and dry, no rash. Neuro:  Strength and sensation are intact. Psych: Normal affect.  Accessory Clinical Findings    ECG personally reviewed by me today -    - no acute changes.   Lab Results  Component Value Date   WBC 7.2 06/08/2023   HGB 15.4 06/08/2023   HCT 46.2 06/08/2023   MCV 95.3 06/08/2023   PLT 224 06/08/2023   Lab Results  Component Value Date   CREATININE 1.29 (H) 09/27/2023   BUN 22 09/27/2023   NA 136 09/27/2023   K 4.4 09/27/2023   CL 102 09/27/2023   CO2 23 09/27/2023   Lab Results  Component Value Date   ALT <5 09/27/2023   AST 9 (L) 09/27/2023   ALKPHOS 94 09/27/2023   BILITOT 0.5 09/27/2023   Lab Results  Component Value Date   CHOL 120 11/21/2019   HDL 44 11/21/2019   LDLCALC 56 11/21/2019   TRIG 112 11/21/2019   CHOLHDL 2.7 11/21/2019  Lab Results  Component Value Date   HGBA1C 5.8 (H) 03/30/2019    Assessment & Plan    1.  ***  No BP recorded.  {Refresh Note OR Click here to enter BP  :1}***   Jude Norton, NP 10/10/2023, 6:36 AM

## 2023-10-16 DIAGNOSIS — I5032 Chronic diastolic (congestive) heart failure: Secondary | ICD-10-CM | POA: Diagnosis not present

## 2023-10-16 DIAGNOSIS — R251 Tremor, unspecified: Secondary | ICD-10-CM | POA: Diagnosis not present

## 2023-10-16 DIAGNOSIS — J441 Chronic obstructive pulmonary disease with (acute) exacerbation: Secondary | ICD-10-CM | POA: Diagnosis not present

## 2023-10-17 ENCOUNTER — Telehealth: Payer: Self-pay | Admitting: Dietician

## 2023-10-17 NOTE — Telephone Encounter (Signed)
 Patient screened on MST. First attempt to reach. Provided my cell# on daughter [name]'s voice mail and in a text to return call to set up a nutrition consult.  Carleen Chary, RDN, LDN Registered Dietitian, [facility] Part Time Remote (Usual office hours: Tuesday-Thursday) Cell: [phone]

## 2023-10-18 DIAGNOSIS — M48062 Spinal stenosis, lumbar region with neurogenic claudication: Secondary | ICD-10-CM | POA: Diagnosis not present

## 2023-10-18 DIAGNOSIS — Z6833 Body mass index (BMI) 33.0-33.9, adult: Secondary | ICD-10-CM | POA: Diagnosis not present

## 2023-10-20 ENCOUNTER — Telehealth: Payer: Self-pay

## 2023-10-20 DIAGNOSIS — Z860101 Personal history of adenomatous and serrated colon polyps: Secondary | ICD-10-CM | POA: Insufficient documentation

## 2023-10-20 DIAGNOSIS — G894 Chronic pain syndrome: Secondary | ICD-10-CM | POA: Insufficient documentation

## 2023-10-20 DIAGNOSIS — M109 Gout, unspecified: Secondary | ICD-10-CM | POA: Insufficient documentation

## 2023-10-20 DIAGNOSIS — K219 Gastro-esophageal reflux disease without esophagitis: Secondary | ICD-10-CM | POA: Insufficient documentation

## 2023-10-20 DIAGNOSIS — I700 Atherosclerosis of aorta: Secondary | ICD-10-CM | POA: Insufficient documentation

## 2023-10-20 DIAGNOSIS — C7A8 Other malignant neuroendocrine tumors: Secondary | ICD-10-CM | POA: Insufficient documentation

## 2023-10-20 DIAGNOSIS — R7303 Prediabetes: Secondary | ICD-10-CM | POA: Insufficient documentation

## 2023-10-20 DIAGNOSIS — E538 Deficiency of other specified B group vitamins: Secondary | ICD-10-CM | POA: Insufficient documentation

## 2023-10-20 DIAGNOSIS — E785 Hyperlipidemia, unspecified: Secondary | ICD-10-CM | POA: Insufficient documentation

## 2023-10-20 DIAGNOSIS — Z8673 Personal history of transient ischemic attack (TIA), and cerebral infarction without residual deficits: Secondary | ICD-10-CM | POA: Insufficient documentation

## 2023-10-20 DIAGNOSIS — M170 Bilateral primary osteoarthritis of knee: Secondary | ICD-10-CM | POA: Insufficient documentation

## 2023-10-20 DIAGNOSIS — Z91018 Allergy to other foods: Secondary | ICD-10-CM | POA: Insufficient documentation

## 2023-10-20 DIAGNOSIS — Z91030 Bee allergy status: Secondary | ICD-10-CM | POA: Insufficient documentation

## 2023-10-20 DIAGNOSIS — J449 Chronic obstructive pulmonary disease, unspecified: Secondary | ICD-10-CM | POA: Insufficient documentation

## 2023-10-20 DIAGNOSIS — F419 Anxiety disorder, unspecified: Secondary | ICD-10-CM | POA: Insufficient documentation

## 2023-10-20 NOTE — Telephone Encounter (Signed)
   Name: THEO KRUMHOLZ  DOB: 07-20-1940  MRN: [id number]  Primary Cardiologist: [name], MD  Chart reviewed as part of pre-operative protocol coverage. Because of Charli W Lainez's past medical history and time since last visit, he will require a follow-up in-office visit in order to better assess preoperative cardiovascular risk.  Patient was seen by Dr. Swaziland on 07/28/23. Per his note patient was having intermittent episodes of acute shortness of breath. He was scheduled to see Marlana Silvan NP on 6/3 but did not present for appointment   Pre-op  covering staff: - Please schedule appointment and call patient to inform them. If patient already had an upcoming appointment within acceptable timeframe, please add pre-op  clearance to the appointment notes so provider is aware. - Please contact requesting surgeon's office via preferred method (i.e, phone, fax) to inform them of need for appointment prior to surgery.  This message will also be routed to pharmacy pool for input on holding eliquis  as requested below so that this information is available to the clearing provider at time of patient's appointment.   Debria Fang, PA-C  10/20/2023, 10:01 AM

## 2023-10-20 NOTE — Telephone Encounter (Signed)
   Pre-operative Risk Assessment    Patient Name: Jake Dunn  DOB: [birth year] MRN: [id number]   Date of last office visit: 07/28/23 [name], MD Date of next office visit: NONE   Request for Surgical Clearance    Procedure:  TRANSLAMINAR INJECTION  Date of Surgery:  Clearance TBD                                Surgeon:  DR Elna Haggis Surgeon's Group or Practice Name:  [facility] Phone number:  [phone]  EXT 8221 Fax number:  [phone]  OR [phone]   ATTN: [id number]   Type of Clearance Requested:   - Medical  - Pharmacy:  Hold Aspirin  and Apixaban  (Eliquis )     Type of Anesthesia:  General    Additional requests/questions:    SignedCollin Deal   10/20/2023, 7:59 AM

## 2023-10-20 NOTE — Telephone Encounter (Signed)
 Patient with diagnosis of PAF on Eliquis  for anticoagulation.    Procedure: TRANSLAMINAR INJECTION  Date of procedure: TBD   CHA2DS2-VASc Score = 7  This indicates a 11.2% annual risk of stroke. The patient's score is based upon: CHF History: 1 HTN History: 1 Diabetes History: 0 Stroke History: 2 Vascular Disease History: 1 Age Score: 2 Gender Score: 0    CrCl 64 ml/min Platelet count 224K  Patient will need to hold Eliquis  for 3 days for spinal injection. Due to elevated risk score plus history of stroke, will route to Dr Swaziland for input.     **This guidance is not considered finalized until pre-operative APP has relayed final recommendations.**

## 2023-10-20 NOTE — Telephone Encounter (Signed)
 Left message for pt to call back to schedule in office appt for preop clearance.

## 2023-10-23 NOTE — Telephone Encounter (Signed)
 2nd Attempt: Left message for pt to call our office to schedule IN OFFICE Preop appt.

## 2023-10-24 ENCOUNTER — Inpatient Hospital Stay: Attending: Nurse Practitioner

## 2023-10-24 ENCOUNTER — Ambulatory Visit: Payer: Self-pay | Admitting: Oncology

## 2023-10-24 ENCOUNTER — Ambulatory Visit (HOSPITAL_BASED_OUTPATIENT_CLINIC_OR_DEPARTMENT_OTHER)
Admission: RE | Admit: 2023-10-24 | Discharge: 2023-10-24 | Disposition: A | Discharge status: Home / Self Care | Source: Ambulatory Visit | Attending: Oncology | Admitting: Oncology

## 2023-10-24 DIAGNOSIS — I700 Atherosclerosis of aorta: Secondary | ICD-10-CM | POA: Diagnosis not present

## 2023-10-24 DIAGNOSIS — E3400 Carcinoid syndrome, unspecified: Secondary | ICD-10-CM | POA: Insufficient documentation

## 2023-10-24 DIAGNOSIS — C7A8 Other malignant neuroendocrine tumors: Secondary | ICD-10-CM | POA: Insufficient documentation

## 2023-10-24 DIAGNOSIS — Z85528 Personal history of other malignant neoplasm of kidney: Secondary | ICD-10-CM | POA: Insufficient documentation

## 2023-10-24 DIAGNOSIS — C7A098 Malignant carcinoid tumors of other sites: Secondary | ICD-10-CM | POA: Insufficient documentation

## 2023-10-24 DIAGNOSIS — Z8673 Personal history of transient ischemic attack (TIA), and cerebral infarction without residual deficits: Secondary | ICD-10-CM | POA: Diagnosis not present

## 2023-10-24 DIAGNOSIS — C642 Malignant neoplasm of left kidney, except renal pelvis: Secondary | ICD-10-CM | POA: Diagnosis not present

## 2023-10-24 DIAGNOSIS — I4891 Unspecified atrial fibrillation: Secondary | ICD-10-CM | POA: Insufficient documentation

## 2023-10-24 DIAGNOSIS — J8410 Pulmonary fibrosis, unspecified: Secondary | ICD-10-CM | POA: Diagnosis not present

## 2023-10-24 LAB — CBC WITH DIFFERENTIAL (CANCER CENTER ONLY)
Abs Immature Granulocytes: 0.33 10*3/uL — ABNORMAL HIGH (ref 0.00–0.07)
Basophils Absolute: 0.1 10*3/uL (ref 0.0–0.1)
Basophils Relative: 1 %
Eosinophils Absolute: 0.1 10*3/uL (ref 0.0–0.5)
Eosinophils Relative: 1 %
HCT: 47.4 % (ref 39.0–52.0)
Hemoglobin: 16 g/dL (ref 13.0–17.0)
Immature Granulocytes: 4 %
Lymphocytes Relative: 17 %
Lymphs Abs: 1.6 10*3/uL (ref 0.7–4.0)
MCH: 31.5 pg (ref 26.0–34.0)
MCHC: 33.8 g/dL (ref 30.0–36.0)
MCV: 93.3 fL (ref 80.0–100.0)
Monocytes Absolute: 1.1 10*3/uL — ABNORMAL HIGH (ref 0.1–1.0)
Monocytes Relative: 12 %
Neutro Abs: 6.1 10*3/uL (ref 1.7–7.7)
Neutrophils Relative %: 65 %
Platelet Count: 335 10*3/uL (ref 150–400)
RBC: 5.08 MIL/uL (ref 4.22–5.81)
RDW: 13.2 % (ref 11.5–15.5)
WBC Count: 9.3 10*3/uL (ref 4.0–10.5)
nRBC: 0 % (ref 0.0–0.2)

## 2023-10-24 LAB — CMP (CANCER CENTER ONLY)
ALT: 9 U/L (ref 0–44)
AST: 16 U/L (ref 15–41)
Albumin: 4.2 g/dL (ref 3.5–5.0)
Alkaline Phosphatase: 100 U/L (ref 38–126)
Anion gap: 12 (ref 5–15)
BUN: 26 mg/dL — ABNORMAL HIGH (ref 8–23)
CO2: 23 mmol/L (ref 22–32)
Calcium: 9.5 mg/dL (ref 8.9–10.3)
Chloride: 99 mmol/L (ref 98–111)
Creatinine: 1.43 mg/dL — ABNORMAL HIGH (ref 0.61–1.24)
GFR, Estimated: 50 mL/min — ABNORMAL LOW (ref >60)
Glucose, Bld: 125 mg/dL — ABNORMAL HIGH (ref 70–99)
Potassium: 4.6 mmol/L (ref 3.5–5.1)
Sodium: 133 mmol/L — ABNORMAL LOW (ref 135–145)
Total Bilirubin: 0.7 mg/dL (ref 0.0–1.2)
Total Protein: 7.5 g/dL (ref 6.5–8.1)

## 2023-10-24 MED ORDER — IOHEXOL 300 MG/ML  SOLN
100.0000 mL | Freq: Once | INTRAMUSCULAR | Status: AC | PRN
  Administered 2023-10-24: 100 mL via INTRAVENOUS

## 2023-10-24 NOTE — Telephone Encounter (Signed)
 3rd attempt to reach pt to schedule in office appt for preop clearance. I will update the requesting office the pt needs to call the office to schedule in office appt for preop clearance.

## 2023-10-25 ENCOUNTER — Ambulatory Visit: Admitting: Oncology

## 2023-10-25 ENCOUNTER — Encounter: Payer: Self-pay | Admitting: Oncology

## 2023-10-25 NOTE — Telephone Encounter (Signed)
 Mrs.  Komatsu returned call and left VM requesting return call. Called her back and was put to voice mail. Left message that CT is improved and MD wants to see him tomorrow at 12:20 prior to his injection that day.

## 2023-10-25 NOTE — Telephone Encounter (Signed)
 Attempted to call patient multiple times, unavailable, can't leave voicemail. Patient is scheduled tomorrow for office visit/ injection. If continued unavailability today, can be made aware tomorrow of CT scan results during visit.

## 2023-10-25 NOTE — Telephone Encounter (Signed)
-----   Message from Coni Deep sent at 10/24/2023  8:27 PM EDT ----- Please call patient, CTs show a mild decrease in size of the pancreas masses Continue sandostatin , he should have a f/u visit scheduled with the next sandostatin  injection  ----- Message ----- From: Interface, Rad Results In Sent: 10/24/2023   3:05 PM EDT To: Sumner Ends, MD

## 2023-10-26 ENCOUNTER — Ambulatory Visit

## 2023-10-26 ENCOUNTER — Ambulatory Visit: Admitting: Oncology

## 2023-10-26 LAB — CHROMOGRANIN A: Chromogranin A (ng/mL): 51 ng/mL (ref 0.0–101.8)

## 2023-11-02 ENCOUNTER — Inpatient Hospital Stay: Admitting: Oncology

## 2023-11-02 ENCOUNTER — Inpatient Hospital Stay

## 2023-11-02 VITALS — BP 126/80 | HR 86 | Temp 97.8°F | Resp 18 | Ht 67.0 in | Wt 214.9 lb

## 2023-11-02 DIAGNOSIS — C7A098 Malignant carcinoid tumors of other sites: Secondary | ICD-10-CM | POA: Diagnosis not present

## 2023-11-02 DIAGNOSIS — Z8673 Personal history of transient ischemic attack (TIA), and cerebral infarction without residual deficits: Secondary | ICD-10-CM | POA: Diagnosis not present

## 2023-11-02 DIAGNOSIS — C7A8 Other malignant neuroendocrine tumors: Secondary | ICD-10-CM

## 2023-11-02 DIAGNOSIS — I4891 Unspecified atrial fibrillation: Secondary | ICD-10-CM | POA: Diagnosis not present

## 2023-11-02 DIAGNOSIS — Z85528 Personal history of other malignant neoplasm of kidney: Secondary | ICD-10-CM | POA: Diagnosis not present

## 2023-11-02 DIAGNOSIS — E3400 Carcinoid syndrome, unspecified: Secondary | ICD-10-CM | POA: Diagnosis not present

## 2023-11-02 MED ORDER — OCTREOTIDE ACETATE 20 MG IM KIT
20.0000 mg | PACK | Freq: Once | INTRAMUSCULAR | Status: AC
  Administered 2023-11-02: 20 mg via INTRAMUSCULAR
  Filled 2023-11-02: qty 1

## 2023-11-02 NOTE — Patient Instructions (Signed)
 [facility]   Discharge Instructions: Thank you for choosing [facility] to provide your oncology and hematology care.   If you have a lab appointment with the Cancer Center, please go directly to the Cancer Center and check in at the registration area.   Wear comfortable clothing and clothing appropriate for easy access to any Portacath or PICC line.   We strive to give you quality time with your provider. You may need to reschedule your appointment if you arrive late (15 or more minutes).  Arriving late affects you and other patients whose appointments are after yours.  Also, if you miss three or more appointments without notifying the office, you may be dismissed from the clinic at the provider's discretion.      For prescription refill requests, have your pharmacy contact our office and allow 72 hours for refills to be completed.    Today you received the following chemotherapy and/or immunotherapy agents SANDOSTATIN      To help prevent nausea and vomiting after your treatment, we encourage you to take your nausea medication as directed.  BELOW ARE SYMPTOMS THAT SHOULD BE REPORTED IMMEDIATELY: *FEVER GREATER THAN 100.4 F (38 C) OR HIGHER *CHILLS OR SWEATING *NAUSEA AND VOMITING THAT IS NOT CONTROLLED WITH YOUR NAUSEA MEDICATION *UNUSUAL SHORTNESS OF BREATH *UNUSUAL BRUISING OR BLEEDING *URINARY PROBLEMS (pain or burning when urinating, or frequent urination) *BOWEL PROBLEMS (unusual diarrhea, constipation, pain near the anus) TENDERNESS IN MOUTH AND THROAT WITH OR WITHOUT PRESENCE OF ULCERS (sore throat, sores in mouth, or a toothache) UNUSUAL RASH, SWELLING OR PAIN  UNUSUAL VAGINAL DISCHARGE OR ITCHING   Items with * indicate a potential emergency and should be followed up as soon as possible or go to the Emergency Department if any problems should occur.  Please show the CHEMOTHERAPY ALERT CARD or  IMMUNOTHERAPY ALERT CARD at check-in to the Emergency Department and triage nurse.  Should you have questions after your visit or need to cancel or reschedule your appointment, please contact [facility]  Dept: [phone]  and follow the prompts.  Office hours are 8:00 a.m. to 4:30 p.m. Monday - Friday. Please note that voicemails left after 4:00 p.m. may not be returned until the following business day.  We are closed weekends and major holidays. You have access to a nurse at all times for urgent questions. Please call the main number to the clinic Dept: [phone] and follow the prompts.   For any non-urgent questions, you may also contact your provider using MyChart. We now offer e-Visits for anyone 38 and older to request care online for non-urgent symptoms. For details visit [email or web address].   Also download the MyChart app! Go to the app store, search "MyChart", open the app, select [facility], and log in with your MyChart username and password.  Octreotide Injection Solution What is this medication? OCTREOTIDE (ok TREE oh tide) treats high levels of growth hormone (acromegaly). It works by reducing the amount of growth hormone your body makes. This reduces symptoms and the risk of health problems caused by too much growth hormone, such as diabetes and heart disease. It may also be used to treat diarrhea caused by neuroendocrine tumors. It works by slowing down the release of serotonin from the tumor cells. This reduces the number of bowel movements you have. This medicine may be used for other purposes; ask  your health care provider or pharmacist if you have questions. COMMON BRAND NAME(S): Berline Lopes, Sandostatin What should I tell my care team before I take this medication? They need to know if you have any of these conditions: Diabetes Gallbladder disease Heart disease Kidney disease Liver disease Pancreatic  disease Thyroid disease An unusual or allergic reaction to octreotide, other medications, foods, dyes, or preservatives Pregnant or trying to get pregnant Breastfeeding How should I use this medication? This medication is injected under the skin or into a vein. It is usually given by your care team in a hospital or clinic setting. If you get this medication at home, you will be taught how to prepare and give it. Use exactly as directed. Take it as directed on the prescription label at the same time every day. Keep taking it unless your care team tells you to stop. Allow the injection solution to come to room temperature before use. Do not warm it artificially. It is important that you put your used needles and syringes in a special sharps container. Do not put them in a trash can. If you do not have a sharps container, call your pharmacist or care team to get one. Talk to your care team about the use of this medication in children. Special care may be needed. Overdosage: If you think you have taken too much of this medicine contact a poison control center or emergency room at once. NOTE: This medicine is only for you. Do not share this medicine with others. What if I miss a dose? If you miss a dose, take it as soon as you can. If it is almost time for your next dose, take only that dose. Do not take double or extra doses. What may interact with this medication? Bromocriptine Certain medications for blood pressure, heart disease, irregular heartbeat Cyclosporine Diuretics Medications for diabetes, including insulin Quinidine This list may not describe all possible interactions. Give your health care provider a list of all the medicines, herbs, non-prescription drugs, or dietary supplements you use. Also tell them if you smoke, drink alcohol, or use illegal drugs. Some items may interact with your medicine. What should I watch for while using this medication? Visit your care team for regular  checks on your progress. Tell your care team if your symptoms do not start to get better or if they get worse. To help reduce irritation at the injection site, use a different site for each injection and make sure the solution is at room temperature before use. This medication may cause decreases in blood sugar. Signs of low blood sugar include chills, cool, pale skin or cold sweats, drowsiness, extreme hunger, fast heartbeat, headache, nausea, nervousness or anxiety, shakiness, trembling, unsteadiness, tiredness, or weakness. Contact your care team right away if you experience any of these symptoms. This medication may increase blood sugar. The risk may be higher in patients who already have diabetes. Ask your care team what you can do to lower your risk of diabetes while taking this medication. You should make sure you get enough vitamin B12 while you are taking this medication. Discuss the foods you eat and the vitamins you take with your care team. What side effects may I notice from receiving this medication? Side effects that you should report to your care team as soon as possible: Allergic reactions--skin rash, itching, hives, swelling of the face, lips, tongue, or throat Gallbladder problems--severe stomach pain, nausea, vomiting, fever Heart rhythm changes--fast or irregular heartbeat, dizziness, feeling faint or  lightheaded, chest pain, trouble breathing High blood sugar (hyperglycemia)--increased thirst or amount of urine, unusual weakness or fatigue, blurry vision Low blood sugar (hypoglycemia)--tremors or shaking, anxiety, sweating, cold or clammy skin, confusion, dizziness, rapid heartbeat Low thyroid levels (hypothyroidism)--unusual weakness or fatigue, increased sensitivity to cold, constipation, hair loss, dry skin, weight gain, feelings of depression Low vitamin B12 level--pain, tingling, or numbness in the hands or feet, muscle weakness, dizziness, confusion, trouble  concentrating Oily or light-colored stools, diarrhea, bloating, weight loss Pancreatitis--severe stomach pain that spreads to your back or gets worse after eating or when touched, fever, nausea, vomiting Slow heartbeat--dizziness, feeling faint or lightheaded, confusion, trouble breathing, unusual weakness or fatigue Side effects that usually do not require medical attention (report these to your care team if they continue or are bothersome): Diarrhea Dizziness Headache Nausea Pain, redness, or irritation at injection site Stomach pain This list may not describe all possible side effects. Call your doctor for medical advice about side effects. You may report side effects to FDA at 1-800-FDA-1088. Where should I keep my medication? Keep out of the reach of children and pets. Store in the refrigerator. Protect from light. Allow to come to room temperature naturally. Do not use artificial heat. If protected from light, the injection may be stored between 20 and 30 degrees C (70 and 86 degrees F) for 14 days. After the initial use, throw away any unused portion of a multiple dose vial after 14 days. Get rid of any unused portions of the ampules after use. To get rid of medications that are no longer needed or have expired: Take the medication to a medication take-back program. Ask your pharmacy or law enforcement to find a location. If you cannot return the medication, ask your pharmacist or care team how to get rid of the medication safely. NOTE: This sheet is a summary. It may not cover all possible information. If you have questions about this medicine, talk to your doctor, pharmacist, or health care provider.  2024 Elsevier/Gold Standard (2023-04-07 00:00:00)

## 2023-11-02 NOTE — Progress Notes (Signed)
 [facility] OFFICE PROGRESS NOTE   Diagnosis: Pancreas neuroendocrine tumor  INTERVAL HISTORY:   Jake Dunn returns as scheduled.  He continues monthly Sandostatin .  [place]  He developed an upper respiratory infection while traveling.  He feels well at present.  No dizziness or abdominal pain.  No recent syncope.  Objective:  Vital signs in last 24 hours:  Blood pressure 126/80, pulse 86, temperature 97.8 F (36.6 C), temperature source Temporal, resp. rate 18, height 5' 7 (1.702 m), weight 214 lb 14.4 oz (97.5 kg), SpO2 100%.   Lymphatics: No cervical, supraclavicular, or axillary nodes Resp: Lungs clear bilaterally Cardio: Irregular GI: No hepatosplenomegaly, no mass, nontender Vascular: No leg edema  Lab Results:  Lab Results  Component Value Date   WBC 9.3 10/24/2023   HGB 16.0 10/24/2023   HCT 47.4 10/24/2023   MCV 93.3 10/24/2023   PLT 335 10/24/2023   NEUTROABS 6.1 10/24/2023    CMP  Lab Results  Component Value Date   NA 133 (L) 10/24/2023   K 4.6 10/24/2023   CL 99 10/24/2023   CO2 23 10/24/2023   GLUCOSE 125 (H) 10/24/2023   BUN 26 (H) 10/24/2023   CREATININE 1.43 (H) 10/24/2023   CALCIUM  9.5 10/24/2023   PROT 7.5 10/24/2023   ALBUMIN  4.2 10/24/2023   AST 16 10/24/2023   ALT 9 10/24/2023   ALKPHOS 100 10/24/2023   BILITOT 0.7 10/24/2023   GFRNONAA 50 (L) 10/24/2023   GFRAA 66 11/21/2019    Lab Results  Component Value Date   CAN199 10 06/16/2022    Lab Results  Component Value Date   INR 1.3 (H) 04/02/2019   LABPROT 16.3 (H) 04/02/2019    Imaging:  No results found.  Medications: I have reviewed the patient's current medications.   Assessment/Plan: Pancreas masses CT chest 06/05/2022-10 mm right hilar node, 9 mm central right upper lobe nodule, 2 contiguous lobulated masses in the central abdomen, left nephrectomy MRI abdomen 06/06/2022-2 adjacent  hypervascular masses in the central pancreas with associated ductal dilatation and atrophy, probable prominent lymph node superior to the right mass, the right-sided mass causes mass effect at the celiac axis and portal vein Upper EUS 06/13/2022-mass identified in the pancreatic body, FNA performed, vascular involvement suspected.  Second similar smaller mass seen immediately adjacent to the larger mass.  Cytology-malignant cells present, solid pseudopapillary neoplasm favored PET scan 07/04/2022-mild increased FDG uptake associated with the dominant mass centered around the pancreatic neck.  Also mild increased uptake associated with the mass within the tail of the pancreas.  Solid-appearing lesion or lymph node within the gastrohepatic ligament which directly abuts the superior margin of the dominant pancreatic mass, also exhibits mild increased FDG uptake.  Mild FDG uptake associated with a right upper lobe lung nodule EUS 07/27/2022-Twin masses identified in the pancreas body, no pathology in the left lobe of the liver or gallbladder, no adenopathy, FNA biopsy-atypical cells present -could not be further characterize due to lack of cellularity on cell block Dotatate PET 07/28/2022-moderate radiotracer activity associated with pancreatic lesions including a small pancreas tail nodule consistent with well-differentiated versus moderately differentiated neuroendocrine tumor, no metastatic adenopathy or distant metastatic disease Chromogranin A elevated on 08/15/2022 Monthly Sandostatin  initiated 08/24/2022 CTs 12/26/2022-unchanged pancreas masses, no evidence of metastatic disease in the chest abdomen or pelvis, unchanged right upper lobe nodule suspicious for a primary lung tumor CTs 10/24/2023: Pancreas masses stable to  slightly improved, no new site of metastatic disease, unchanged right upper lobe pulmonary nodule, new 6 mm left lower lobe nodule versus mucoid impaction 2.   Remote history of renal cell  carcinoma, status post a left nephrectomy 03/16/1994 status post left nephrectomy, renal cell carcinoma, pT2, pNX, pMX, tumor characterized by sheets and clusters of neoplastic clear to granular cells, consistent with a renal cell carcinoma grade 2, no definite diagnostic vascular invasion of larger vessels, tumor does not appear to extend through the renal capsule. 3.   Atrial fibrillation 4.   Coronary artery disease, coronary artery bypass surgery in 2020 5.   History of CVA 6.   Multiple spine surgeries 7.   Left upper lobe nodule and borderline enlarged right hilar node on chest CT 06/05/2022 8.   Gout 9.   History of adenomatous polyps 10.  Family history of pancreas cancer 11.  Right upper lobe nodule with mild FDG uptake on PET 07/04/2022 Unchanged right upper lobe nodule on chest CT 12/26/2022 12.  History of dizziness/syncope and vertigo       Disposition: Jake Dunn appears well.  The restaging CTs reveal no evidence of disease progression.  I reviewed the CT findings and images with him.  The chromogranin A was normal on 10/24/2023.  The plan is to continue monthly Sandostatin .  He will return for an office visit in 3 months. We will plan for a CT in 6-9 months to follow-up on the pancreas masses and left lung nodules.  Arley Hof, MD  11/02/2023  8:36 AM

## 2023-11-09 ENCOUNTER — Telehealth: Payer: Self-pay | Admitting: Dietician

## 2023-11-09 NOTE — Telephone Encounter (Signed)
 Patient screened on MST. Second attempt to reach. Provided my cell# on daughter [name]'s voice mail and in a text to return call to set up a nutrition consult.  Micheline Craven, RDN, LDN Registered Dietitian, [facility] Part Time Remote (Usual office hours: Tuesday-Thursday) Cell: [phone]

## 2023-11-13 DIAGNOSIS — I480 Paroxysmal atrial fibrillation: Secondary | ICD-10-CM | POA: Diagnosis not present

## 2023-11-13 DIAGNOSIS — E785 Hyperlipidemia, unspecified: Secondary | ICD-10-CM | POA: Diagnosis not present

## 2023-11-13 DIAGNOSIS — I129 Hypertensive chronic kidney disease with stage 1 through stage 4 chronic kidney disease, or unspecified chronic kidney disease: Secondary | ICD-10-CM | POA: Diagnosis not present

## 2023-11-13 DIAGNOSIS — N183 Chronic kidney disease, stage 3 unspecified: Secondary | ICD-10-CM | POA: Diagnosis not present

## 2023-11-16 ENCOUNTER — Telehealth: Payer: Self-pay | Admitting: Dietician

## 2023-11-16 NOTE — Telephone Encounter (Signed)
 Received text from daughter suggesting I try to reach spouse at her cell [phone] Provided my cell# on voice mail to return call to set up a nutrition consult.  Micheline Craven, RDN, LDN Registered Dietitian, [facility] Part Time Remote (Usual office hours: Tuesday-Thursday) Cell: [phone]

## 2023-11-16 NOTE — Telephone Encounter (Signed)
 Patient screened on MST. Third attempt to reach. Home# is not accepting calls. Provided my cell# on daughter [name]'s voice mail and in a text to return call to set up a nutrition consult.  Micheline Craven, RDN, LDN Registered Dietitian, [facility] Part Time Remote (Usual office hours: Tuesday-Thursday) Cell: [phone]

## 2023-11-20 ENCOUNTER — Telehealth: Payer: Self-pay | Admitting: Oncology

## 2023-11-23 ENCOUNTER — Telehealth: Payer: Self-pay | Admitting: Dietician

## 2023-11-23 NOTE — Telephone Encounter (Signed)
 Fourth and final attempt to reach patient by telephone. Have left messages with return number. Please consult RD for future needs.    Micheline Craven, RDN, LDN Registered Dietitian, [facility] Part Time Remote (Usual office hours: Tuesday-Thursday) Mobile: [phone] Remote Office: [phone]

## 2023-12-01 ENCOUNTER — Telehealth: Payer: Self-pay

## 2023-12-01 NOTE — Telephone Encounter (Signed)
 Placed a telephone call to the patient to inform him that his injection appointment on Monday, 12/04/2023, has been rescheduled from 0900 to 1400. Patient was agreeable to the change and verbalized understanding.

## 2023-12-04 ENCOUNTER — Telehealth: Payer: Self-pay | Admitting: Oncology

## 2023-12-04 ENCOUNTER — Inpatient Hospital Stay: Attending: Nurse Practitioner

## 2023-12-04 VITALS — BP 114/67 | HR 61 | Temp 98.3°F | Resp 18

## 2023-12-04 DIAGNOSIS — E3400 Carcinoid syndrome, unspecified: Secondary | ICD-10-CM | POA: Insufficient documentation

## 2023-12-04 DIAGNOSIS — C7A098 Malignant carcinoid tumors of other sites: Secondary | ICD-10-CM | POA: Diagnosis not present

## 2023-12-04 DIAGNOSIS — C7A8 Other malignant neuroendocrine tumors: Secondary | ICD-10-CM

## 2023-12-04 MED ORDER — OCTREOTIDE ACETATE 20 MG IM KIT
20.0000 mg | PACK | Freq: Once | INTRAMUSCULAR | Status: AC
Start: 2023-12-04 — End: 2023-12-04
  Administered 2023-12-04: 20 mg via INTRAMUSCULAR
  Filled 2023-12-04: qty 1

## 2023-12-04 NOTE — Telephone Encounter (Signed)
 PT wife called to reschedule injection appointment. They will be on vacation.

## 2023-12-04 NOTE — Patient Instructions (Signed)
 [facility]   Discharge Instructions: Thank you for choosing [facility] to provide your oncology and hematology care.   If you have a lab appointment with the Cancer Center, please go directly to the Cancer Center and check in at the registration area.   Wear comfortable clothing and clothing appropriate for easy access to any Portacath or PICC line.   We strive to give you quality time with your provider. You may need to reschedule your appointment if you arrive late (15 or more minutes).  Arriving late affects you and other patients whose appointments are after yours.  Also, if you miss three or more appointments without notifying the office, you may be dismissed from the clinic at the provider's discretion.      For prescription refill requests, have your pharmacy contact our office and allow 72 hours for refills to be completed.    Today you received the following chemotherapy and/or immunotherapy agents SANDOSTATIN      To help prevent nausea and vomiting after your treatment, we encourage you to take your nausea medication as directed.  BELOW ARE SYMPTOMS THAT SHOULD BE REPORTED IMMEDIATELY: *FEVER GREATER THAN 100.4 F (38 C) OR HIGHER *CHILLS OR SWEATING *NAUSEA AND VOMITING THAT IS NOT CONTROLLED WITH YOUR NAUSEA MEDICATION *UNUSUAL SHORTNESS OF BREATH *UNUSUAL BRUISING OR BLEEDING *URINARY PROBLEMS (pain or burning when urinating, or frequent urination) *BOWEL PROBLEMS (unusual diarrhea, constipation, pain near the anus) TENDERNESS IN MOUTH AND THROAT WITH OR WITHOUT PRESENCE OF ULCERS (sore throat, sores in mouth, or a toothache) UNUSUAL RASH, SWELLING OR PAIN  UNUSUAL VAGINAL DISCHARGE OR ITCHING   Items with * indicate a potential emergency and should be followed up as soon as possible or go to the Emergency Department if any problems should occur.  Please show the CHEMOTHERAPY ALERT CARD or  IMMUNOTHERAPY ALERT CARD at check-in to the Emergency Department and triage nurse.  Should you have questions after your visit or need to cancel or reschedule your appointment, please contact [facility]  Dept: [phone]  and follow the prompts.  Office hours are 8:00 a.m. to 4:30 p.m. Monday - Friday. Please note that voicemails left after 4:00 p.m. may not be returned until the following business day.  We are closed weekends and major holidays. You have access to a nurse at all times for urgent questions. Please call the main number to the clinic Dept: [phone] and follow the prompts.   For any non-urgent questions, you may also contact your provider using MyChart. We now offer e-Visits for anyone 38 and older to request care online for non-urgent symptoms. For details visit [email or web address].   Also download the MyChart app! Go to the app store, search "MyChart", open the app, select [facility], and log in with your MyChart username and password.  Octreotide Injection Solution What is this medication? OCTREOTIDE (ok TREE oh tide) treats high levels of growth hormone (acromegaly). It works by reducing the amount of growth hormone your body makes. This reduces symptoms and the risk of health problems caused by too much growth hormone, such as diabetes and heart disease. It may also be used to treat diarrhea caused by neuroendocrine tumors. It works by slowing down the release of serotonin from the tumor cells. This reduces the number of bowel movements you have. This medicine may be used for other purposes; ask  your health care provider or pharmacist if you have questions. COMMON BRAND NAME(S): Berline Lopes, Sandostatin What should I tell my care team before I take this medication? They need to know if you have any of these conditions: Diabetes Gallbladder disease Heart disease Kidney disease Liver disease Pancreatic  disease Thyroid disease An unusual or allergic reaction to octreotide, other medications, foods, dyes, or preservatives Pregnant or trying to get pregnant Breastfeeding How should I use this medication? This medication is injected under the skin or into a vein. It is usually given by your care team in a hospital or clinic setting. If you get this medication at home, you will be taught how to prepare and give it. Use exactly as directed. Take it as directed on the prescription label at the same time every day. Keep taking it unless your care team tells you to stop. Allow the injection solution to come to room temperature before use. Do not warm it artificially. It is important that you put your used needles and syringes in a special sharps container. Do not put them in a trash can. If you do not have a sharps container, call your pharmacist or care team to get one. Talk to your care team about the use of this medication in children. Special care may be needed. Overdosage: If you think you have taken too much of this medicine contact a poison control center or emergency room at once. NOTE: This medicine is only for you. Do not share this medicine with others. What if I miss a dose? If you miss a dose, take it as soon as you can. If it is almost time for your next dose, take only that dose. Do not take double or extra doses. What may interact with this medication? Bromocriptine Certain medications for blood pressure, heart disease, irregular heartbeat Cyclosporine Diuretics Medications for diabetes, including insulin Quinidine This list may not describe all possible interactions. Give your health care provider a list of all the medicines, herbs, non-prescription drugs, or dietary supplements you use. Also tell them if you smoke, drink alcohol, or use illegal drugs. Some items may interact with your medicine. What should I watch for while using this medication? Visit your care team for regular  checks on your progress. Tell your care team if your symptoms do not start to get better or if they get worse. To help reduce irritation at the injection site, use a different site for each injection and make sure the solution is at room temperature before use. This medication may cause decreases in blood sugar. Signs of low blood sugar include chills, cool, pale skin or cold sweats, drowsiness, extreme hunger, fast heartbeat, headache, nausea, nervousness or anxiety, shakiness, trembling, unsteadiness, tiredness, or weakness. Contact your care team right away if you experience any of these symptoms. This medication may increase blood sugar. The risk may be higher in patients who already have diabetes. Ask your care team what you can do to lower your risk of diabetes while taking this medication. You should make sure you get enough vitamin B12 while you are taking this medication. Discuss the foods you eat and the vitamins you take with your care team. What side effects may I notice from receiving this medication? Side effects that you should report to your care team as soon as possible: Allergic reactions--skin rash, itching, hives, swelling of the face, lips, tongue, or throat Gallbladder problems--severe stomach pain, nausea, vomiting, fever Heart rhythm changes--fast or irregular heartbeat, dizziness, feeling faint or  lightheaded, chest pain, trouble breathing High blood sugar (hyperglycemia)--increased thirst or amount of urine, unusual weakness or fatigue, blurry vision Low blood sugar (hypoglycemia)--tremors or shaking, anxiety, sweating, cold or clammy skin, confusion, dizziness, rapid heartbeat Low thyroid levels (hypothyroidism)--unusual weakness or fatigue, increased sensitivity to cold, constipation, hair loss, dry skin, weight gain, feelings of depression Low vitamin B12 level--pain, tingling, or numbness in the hands or feet, muscle weakness, dizziness, confusion, trouble  concentrating Oily or light-colored stools, diarrhea, bloating, weight loss Pancreatitis--severe stomach pain that spreads to your back or gets worse after eating or when touched, fever, nausea, vomiting Slow heartbeat--dizziness, feeling faint or lightheaded, confusion, trouble breathing, unusual weakness or fatigue Side effects that usually do not require medical attention (report these to your care team if they continue or are bothersome): Diarrhea Dizziness Headache Nausea Pain, redness, or irritation at injection site Stomach pain This list may not describe all possible side effects. Call your doctor for medical advice about side effects. You may report side effects to FDA at 1-800-FDA-1088. Where should I keep my medication? Keep out of the reach of children and pets. Store in the refrigerator. Protect from light. Allow to come to room temperature naturally. Do not use artificial heat. If protected from light, the injection may be stored between 20 and 30 degrees C (70 and 86 degrees F) for 14 days. After the initial use, throw away any unused portion of a multiple dose vial after 14 days. Get rid of any unused portions of the ampules after use. To get rid of medications that are no longer needed or have expired: Take the medication to a medication take-back program. Ask your pharmacy or law enforcement to find a location. If you cannot return the medication, ask your pharmacist or care team how to get rid of the medication safely. NOTE: This sheet is a summary. It may not cover all possible information. If you have questions about this medicine, talk to your doctor, pharmacist, or health care provider.  2024 Elsevier/Gold Standard (2023-04-07 00:00:00)

## 2023-12-19 NOTE — Progress Notes (Unsigned)
 d    Cardiology Office Note   Date:  01/01/2024   ID:  Jake Dunn, Jake Dunn 21-Aug-1940, MRN [id number]  PCP:  Teresa Channel, MD  Cardiologist:   Seairra Otani [place], MD   Chief Complaint  Patient presents with   Coronary Artery Disease   Chest Pain   Palpitations      History of Present Illness: Jake Dunn is a 83 y.o. male who is seen for evaluation of increased SOB. He is s/p CAD s/p CABG. He has a  PMH of CAD, GERD, HTN, HLD, renal cell CA s/p left nephrectomy. Cardiac catheterization in 2014 showed nonobstructive CAD with 70% lesion in third diagonal that was treated medically.    He presented with left sided chest pressure on 08/15/2016. He underwent cardiac catheterization on 08/16/2016, this revealed two-vessel CAD with moderate calcified stenosis in the mid LAD, second diagonal, ramus intermedius, ostial and proximal left circumflex and a severe stenosis of the second PLA branch of the RCA. Overall normal LVEDP and normal LVEF. Recommended  medical therapy and outpatient exercise stress Myoview . While the LAD and the diagonal only showed moderate progression compared to the 2010/2014 studies, the left circumflex and intermediate stenosis had progressed. Echocardiogram obtained on 08/17/2016 showed EF 55-60%, grade 2 diastolic dysfunction, severely dilated left atrium, PA peak pressure 30 mmHg. Lexiscan  Myoview  obtained on 08/23/2016 showed EF 51%, no perfusion defect at stress, overall low risk study.     He was seen in November 2020 with symptoms of chest pain and dyspnea. This led to a cardiac cath showing 3 vessel obstructive CAD. He underwent CABG on 04/02/19 by Dr Shyrl. CABG X 4.  LIMA LAD, RSVG PLV, Ramus, and D2  (T graft off the ramus vein graft). Post op course complicated by Afib. He was loaded with amiodarone . On his  visit in January 2021 amiodarone  and Eliquis  were discontinued.   He did have a sleep study showing few hyponeic episodes without frank apnea. Did not meet  criteria for CPAP trial.   He was admitted in August 2022 with recurrent chest pain. Troponins were normal. He underwent cardiac cath which showed good perfusion and no culprit lesion. Subsequent Echo was unremarkable.   He was admitted 1/26-1/31/24 after presenting with with waxing/waning chest pain similar to prior angina for several days. Due to persistent symptoms including DOE he came to the ER. Troponins were negative though symptoms concerning for angina so patient admitted to cardiology service for plan for echo and cardiac cath. He was also noted to be in atrial fib, rate controlled. Echo showed EF 50-55% with mild MR. Cardiac cath showed patent grafts with no clear culprit for his chest pain. He was started on Ranexa . He did convert to NSR and was DC on Eliquis . CT of the chest also showed a 4.5 cm thoracic aortic aneurysm. There was 2 large hypervascular masses in the abdomen/pancreas concerning for Cancer. Unable to do needle biopsy so outpatient ERCP recommended. 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 Subsequent evaluation c/w neuroendocrine tumor. Treated with Sandostatin -monthly injections- followed by Dr Cloretta.   He was seen as a work in in June by Dr Lavona for syncope sitting in a Animal nutritionist. Event monitor placed. He also complained of SOB. BNP level was 150.   On follow up today he states he is not doing well. States he stands up and feels like everything drains out of him. Very weak - can hardly walk across room. Went to  beach recently and unable to fish with his buddies. Last night had chest pain resolved with 2 sl Ntg. Feels some fluttering at times. States he has felt bad ever since starting shots for his tumor. States it tears up his stomach with a lot of nausea. Recent CT stable. Planning to discuss with Dr Cloretta coming off shots. States he cannot continue with how he is doing and thinks he needs another heart cath.    Past Medical  History:  Diagnosis Date   Adenomatous polyp    Anxiety    Arthritis    knees, ankles, shoulders (08/15/2016)   CAD (coronary artery disease)    s/p cath in 2014 showing significant stenosis in the diagonal side branches and in the RV marginal branch. These vessels were small and diffusely diseased. He is managed medically. 08/16/16 Cath, no disease progression   Chronic lower back pain    Disc disease, degenerative, cervical    GERD (gastroesophageal reflux disease)    Gout    Hyperlipidemia    Hypertension    Osteoarthritis    Renal cell carcinoma    left kidney removal   Stroke [facility])    MINI STROKE 15+ YRS AGO, no residual   Stroke (HCC)    I've had 2 or 3; denies residual on 08/15/2016   Syncope and collapse     Past Surgical History:  Procedure Laterality Date   ANTERIOR CERVICAL DECOMP/DISCECTOMY FUSION  02/2004; 04/2005   thelbert 09/20/2010; thelbert 09/20/2010   APPLICATION OF ROBOTIC ASSISTANCE FOR SPINAL PROCEDURE N/A 01/02/2018   Procedure: APPLICATION OF ROBOTIC ASSISTANCE FOR SPINAL PROCEDURE;  Surgeon: Colon Shove, MD;  Location: [facility] OR;  Service: Neurosurgery;  Laterality: N/A;   BACK SURGERY     CARDIAC CATHETERIZATION  12/14/2008   ef 50-55%. SHOWED SIGNIFICANT STENOSIS AND TO DIAGONAL SIDE BRANCHES AND IN THE RIGHT VENTRICULAR MARGINAL BRANCH. THESE VESSELS WERE SMALL AND DIFFUSELY DISEASED   CARDIOVASCULAR STRESS TEST  12/10/2009   EF 61%. EKG negative. Mild peri ischemia. Managed medically.    CARPAL TUNNEL RELEASE Right 11/2005   thelbert 09/20/2010   CARPAL TUNNEL RELEASE Left 06/2007   thelbert [id number]   COLONOSCOPY  11/2004   thelbert 09/20/2010   COLONOSCOPY W/ BIOPSIES AND POLYPECTOMY  09/2002   thelbert 09/20/2010   CORONARY ARTERY BYPASS GRAFT N/A 04/02/2019   Procedure: CORONARY ARTERY BYPASS GRAFTING (CABG) using LIMA to LAD; Endoscopically harvested right greater saphenous vein to the PLB, Ramus, and Diag 2.;  Surgeon: Shyrl Linnie KIDD, MD;  Location: [facility] OR;   Service: Open Heart Surgery;  Laterality: N/A;   ESOPHAGOGASTRODUODENOSCOPY (EGD) WITH PROPOFOL  N/A 06/13/2022   Procedure: ESOPHAGOGASTRODUODENOSCOPY (EGD) WITH PROPOFOL ;  Surgeon: Burnette Fallow, MD;  Location: [facility];  Service: Gastroenterology;  Laterality: N/A;   ESOPHAGOGASTRODUODENOSCOPY (EGD) WITH PROPOFOL  Bilateral 07/27/2022   Procedure: ESOPHAGOGASTRODUODENOSCOPY (EGD) WITH PROPOFOL ;  Surgeon: Burnette Fallow, MD;  Location: [facility];  Service: Gastroenterology;  Laterality: Bilateral;   EUS Bilateral 07/27/2022   Procedure: UPPER ENDOSCOPIC ULTRASOUND (EUS) LINEAR;  Surgeon: Burnette Fallow, MD;  Location: [facility];  Service: Gastroenterology;  Laterality: Bilateral;   FINE NEEDLE ASPIRATION N/A 06/13/2022   Procedure: FINE NEEDLE ASPIRATION (FNA) LINEAR;  Surgeon: Burnette Fallow, MD;  Location: [facility];  Service: Gastroenterology;  Laterality: N/A;   FINE NEEDLE ASPIRATION Bilateral 07/27/2022   Procedure: FINE NEEDLE ASPIRATION (FNA) LINEAR;  Surgeon: Burnette Fallow, MD;  Location: [facility];  Service: Gastroenterology;  Laterality: Bilateral;   INCISION AND DRAINAGE ABSCESS Right  10/18/2016   Procedure: INCISION AND DRAINAGE ABSCESS RIGHT SHOULDER;  Surgeon: Beverley Evalene BIRCH, MD;  Location: [facility];  Service: Orthopedics;  Laterality: Right;   INCISION AND DRAINAGE OF WOUND Right 08/2005   shoulder/notes 09/20/2010   IR FLUORO GUIDE CV LINE LEFT  10/19/2016   IR US  GUIDE VASC ACCESS LEFT  10/19/2016   IRRIGATION AND DEBRIDEMENT SHOULDER Right 12/22/2016   Procedure: IRRIGATION AND DEBRIDEMENT RIGHT SHOULDER;  Surgeon: Beverley Toribio FALCON, MD;  Location: [facility] OR;  Service: Orthopedics;  Laterality: Right;   JOINT REPLACEMENT     LEFT HEART CATH AND CORONARY ANGIOGRAPHY N/A 08/16/2016   Procedure: Left Heart Cath and Coronary Angiography;  Surgeon: Ozell Fell, MD;  Location: [facility] INVASIVE CV LAB;  Service: Cardiovascular;  Laterality: N/A;   LEFT HEART  CATH AND CORONARY ANGIOGRAPHY N/A 03/26/2019   Procedure: LEFT HEART CATH AND CORONARY ANGIOGRAPHY;  Surgeon: [place], Inanna Telford M, MD;  Location: [facility] INVASIVE CV LAB;  Service: Cardiovascular;  Laterality: N/A;   LEFT HEART CATH AND CORS/GRAFTS ANGIOGRAPHY N/A 12/21/2020   Procedure: LEFT HEART CATH AND CORS/GRAFTS ANGIOGRAPHY;  Surgeon: [place], Robson Trickey M, MD;  Location: [facility] INVASIVE CV LAB;  Service: Cardiovascular;  Laterality: N/A;   LEFT HEART CATH AND CORS/GRAFTS ANGIOGRAPHY N/A 06/03/2022   Procedure: LEFT HEART CATH AND CORS/GRAFTS ANGIOGRAPHY;  Surgeon: Fell Ozell, MD;  Location: [facility] INVASIVE CV LAB;  Service: Cardiovascular;  Laterality: N/A;   LEFT HEART CATHETERIZATION WITH CORONARY ANGIOGRAM N/A 08/09/2012   Procedure: LEFT HEART CATHETERIZATION WITH CORONARY ANGIOGRAM;  Surgeon: Kamea Dacosta M [place], MD;  Location: [facility] CATH LAB;  Service: Cardiovascular;  Laterality: N/A;   LUMBAR FUSION     NEPHRECTOMY Left early 2000s   REPLACEMENT TOTAL KNEE Right 08/2008   thelbert 09/07/2010   SHOULDER ARTHROSCOPY WITH ROTATOR CUFF REPAIR Right 06/2005   thelbert 09/20/2010   TOTAL KNEE ARTHROPLASTY  06/08/2011   Procedure: TOTAL KNEE ARTHROPLASTY;  Surgeon: Toribio FALCON Beverley, MD;  Location: [facility] OR;  Service: Orthopedics;  Laterality: Left;   TOTAL SHOULDER ARTHROPLASTY Right 04/06/2016   Procedure: RIGHT REVERSE TOTAL SHOULDER ARTHROPLASTY;  Surgeon: Toribio FALCON Beverley, MD;  Location: [facility] OR;  Service: Orthopedics;  Laterality: Right;   TOTAL SHOULDER REVISION Right 12/22/2016   Procedure: RIGHT SHOULDER GLENOID AND HUMERAL COMPONENT REVISION;  Surgeon: Beverley Toribio FALCON, MD;  Location: [facility] OR;  Service: Orthopedics;  Laterality: Right;   UPPER ESOPHAGEAL ENDOSCOPIC ULTRASOUND (EUS) Bilateral 06/13/2022   Procedure: UPPER ESOPHAGEAL ENDOSCOPIC ULTRASOUND (EUS);  Surgeon: Burnette Fallow, MD;  Location: [facility];  Service: Gastroenterology;  Laterality: Bilateral;   US  ECHOCARDIOGRAPHY  12/15/2008   EF 60-65%   US   ECHOCARDIOGRAPHY  09/28/2004   EF 55-60%     Current Outpatient Medications  Medication Sig Dispense Refill   acyclovir  (ZOVIRAX ) 400 MG tablet Take 400 mg by mouth 3 (three) times daily as needed (For cold sores). .  5   albuterol  (VENTOLIN  HFA) 108 (90 Base) MCG/ACT inhaler 2 puffs every 6 (six) hours as needed.     allopurinol  (ZYLOPRIM ) 300 MG tablet Take 300 mg by mouth daily.     apixaban  (ELIQUIS ) 5 MG TABS tablet TAKE 1 TABLET BY MOUTH TWICE A DAY 60 tablet 5   aspirin  EC 81 MG tablet Take 81 mg by mouth daily.     atorvastatin  (LIPITOR ) 80 MG tablet Take 1 tablet (80 mg total) by mouth daily. 90 tablet 3   Cyanocobalamin  1000 MCG SUBL Take 1,000 mcg by mouth  daily.     Guaifenesin  1200 MG TB12 Take 1,200 mg by mouth 2 (two) times daily. 1 tablet as needed orally every 12hrs, duration for 5 days     HYDROcodone -acetaminophen  (NORCO/VICODIN) 5-325 MG tablet Take 1 tablet by mouth 3 (three) times daily as needed for moderate pain. 30 tablet 0   LORazepam  (ATIVAN ) 0.5 MG tablet Take 1 tablet (0.5 mg total) by mouth 2 (two) times daily as needed for anxiety. 30 tablet 2   metoprolol  tartrate (LOPRESSOR ) 25 MG tablet Take 12.5 mg by mouth 2 (two) times daily.     Multiple Vitamin (MULTIVITAMIN) tablet Take 1 tablet by mouth daily.     nitroGLYCERIN  (NITROSTAT ) 0.4 MG SL tablet Place 1 tablet (0.4 mg total) under the tongue every 5 (five) minutes as needed for chest pain. 90 tablet 3   ondansetron  (ZOFRAN ) 8 MG tablet TAKE 1 TABLET BY MOUTH EVERY 8 HOURS AS NEEDED FOR NAUSEA AND/OR VOMITING 30 tablet 3   pantoprazole  (PROTONIX ) 40 MG tablet Take 40 mg by mouth every evening.      prochlorperazine  (COMPAZINE ) 10 MG tablet TAKE 1 TABLET BY MOUTH EVERY 6 HOURS AS NEEDED 60 tablet 1   ranolazine  (RANEXA ) 1000 MG SR tablet TAKE 1 TABLET BY MOUTH TWO TIMES A DAY 180 tablet 3   sertraline (ZOLOFT) 50 MG tablet Take 50 mg by mouth daily.     TRELEGY ELLIPTA  100-62.5-25 MCG/ACT AEPB Inhale 1 puff  into the lungs daily.     No current facility-administered medications for this visit.    Allergies:   Latex and Tape    Social History:  The patient  reports that he quit smoking about 34 years ago. His smoking use included cigarettes. He has never used smokeless tobacco. He reports current alcohol  use. He reports that he does not use drugs.   Family History:  The patient's family history includes Breast cancer in his sister and sister; Cancer in his maternal aunt and maternal uncle; Esophageal cancer in his sister; Heart attack in his father; Heart disease in his brother; Lung cancer in his brother; Pancreatic cancer in his sister; Prostate cancer in his brother; Skin cancer in his sister.    ROS:  Please see the history of present illness.   Otherwise, review of systems are positive for none.   All other systems are reviewed and negative.    PHYSICAL EXAM: VS:  BP (!) 152/66 (BP Location: Left Arm, Patient Position: Sitting, Cuff Size: Normal)   Pulse 62   Ht 5' 7 (1.702 m)   SpO2 96%   BMI 33.66 kg/m  , BMI Body mass index is 33.66 kg/m.  GEN: Well nourished, well developed, in no acute distress  HEENT: normal  Neck: no JVD, carotid bruits, or masses Cardiac: RRR; no murmurs, rubs, or gallops,no edema. Respiratory:  clear to auscultation bilaterally, normal work of breathing GI: soft, nontender, nondistended, + BS MS: no deformity or atrophy  Skin: warm and dry, no rash Neuro:  Strength and sensation are intact Psych: euthymic mood, full affect   EKG Interpretation Date/Time:  Monday January 01 2024 08:16:04 EDT Ventricular Rate:  62 PR Interval:  208 QRS Duration:  92 QT Interval:  444 QTC Calculation: 450 R Axis:   5  Text Interpretation: Normal sinus rhythm Normal ECG When compared with ECG of 25-Mar-2023 11:24, T wave inversion in the anterior leads has resolved. Confirmed by [place], Nyjah Denio [id number]) on 01/01/2024 8:18:34 AM    Recent Labs: 10/24/2023:  ALT 9; BUN  26; Creatinine 1.43; Hemoglobin 16.0; Platelet Count 335; Potassium 4.6; Sodium 133    Lipid Panel    Component Value Date/Time   CHOL 120 11/21/2019 0953   TRIG 112 11/21/2019 0953   HDL 44 11/21/2019 0953   CHOLHDL 2.7 11/21/2019 0953   CHOLHDL 3.4 03/30/2019 0212   VLDL 35 03/30/2019 0212   LDLCALC 56 11/21/2019 0953    Dated 01/25/19: cholesterol 117, triglycerides 125, HDL 39, LDL 53. CMET, CBC, TSH normal Dated 04/09/20: cholesterol 123, triglycerides 130, HDL 44, LDL 34. Creatinine 1.17. otherwise CBC, CMET and TSH normal Dated 02/16/21: cholesterol 118, triglycerides 99, HDL 41, LDL 58. Dated 12/02/21: cholesterol 128, triglycerides 195, HDL 39, LDL 57, CBC, CMET and TSH normal Dated 12/16/22: cholesterol 119, triglycerides 162, HDL 41, LDL 50 Dated 06/30/23: A1c 3.6%. creatinine 1.27. otherwise normal CBC and CMET  Wt Readings from Last 3 Encounters:  11/02/23 214 lb 14.4 oz (97.5 kg)  09/27/23 219 lb (99.3 kg)  08/03/23 229 lb 3.2 oz (104 kg)      Other studies Reviewed: Additional studies/ records that were reviewed today include:   Cath 03/26/19: Procedures  LEFT HEART CATH AND CORONARY ANGIOGRAPHY  Conclusion    Prox RCA lesion is 45% stenosed. 2nd RPL lesion is 80% stenosed. Ost LM to Mid LM lesion is 50% stenosed. Ost Cx to Mid Cx lesion is 80% stenosed. Ramus lesion is 90% stenosed. Mid LAD lesion is 70% stenosed. 2nd Diag lesion is 60% stenosed. The left ventricular systolic function is normal. LV end diastolic pressure is normal. The left ventricular ejection fraction is 55-65% by visual estimate.   1. Three vessel obstructive, Calcific CAD 2. Normal LV function 3. Normal LVEDP   Plan: recommend referral to CT surgery for CABG. Will stop Plavix .     Echo 03/30/19: IMPRESSIONS      1. Left ventricular ejection fraction, by visual estimation, is 55 to 60%. The left ventricle has normal function. There is no left ventricular hypertrophy.  2. Global  right ventricle has mildly reduced systolic function.The right ventricular size is mildly enlarged. No increase in right ventricular wall thickness.  3. Left atrial size was normal.  4. Right atrial size was mildly dilated.  5. The mitral valve is normal in structure. No evidence of mitral valve regurgitation.  6. The tricuspid valve is normal in structure. Tricuspid valve regurgitation is trivial.  7. The aortic valve is normal in structure. Aortic valve regurgitation is not visualized. No evidence of aortic valve sclerosis or stenosis.  8. The pulmonic valve was grossly normal. Pulmonic valve regurgitation is not visualized.  9. Aortic dilatation noted. 10. There is mild dilatation of the ascending aorta measuring 38 mm. 11. Normal pulmonary artery systolic pressure. 12. The atrial septum is grossly normal.   Cardiac cath 12/21/20:  LEFT HEART CATH AND CORS/GRAFTS ANGIOGRAPHY   Conclusion      Ost Cx to Mid Cx lesion is 80% stenosed.   Prox RCA lesion is 45% stenosed.   Ramus lesion is 90% stenosed.   2nd RPL lesion is 80% stenosed.   Mid LAD lesion is 65% stenosed.   Ost LM to Mid LM lesion is 40% stenosed.   2nd Diag lesion is 90% stenosed.   LIMA graft was visualized by angiography and is normal in caliber.   SVG graft was visualized by angiography and is normal in caliber.   SVG graft was visualized by angiography and is normal in caliber.  The graft exhibits no disease.   The graft exhibits no disease.   The graft exhibits no disease.   LV end diastolic pressure is normal.   3 vessel obstructive CAD Patent LIMA - this is tied to the second diagonal and not the LAD Patent SVG to the ramus intermediate. No visualization of SVG to diagonal which per op note came off the hood of the SVG to ramus Patent SVG to the PL branch of the RCA Normal LVEDP   Plan: there appears to be good blood flow to all major vessels. The LAD was not revascularized but has borderline disease and  the patient had prior Myoview  with no ischemia in this territory. Other grafts are patent. Recommend continued medical therapy. Does not appear to be volume overloaded. Echo pending.  Given significant difficulty from the radial approach I would recommend femoral access for any future Cardiac cath procedures.  Coronary Diagrams  Diagnostic Dominance: Right Intervention  Echo 01/08/21: IMPRESSIONS     1. Left ventricular ejection fraction, by estimation, is 50 to 55%. The  left ventricle has low normal function. The left ventricle demonstrates  regional wall motion abnormalities (see scoring diagram/findings for  description). There is moderate left  ventricular hypertrophy. Left ventricular diastolic parameters are  consistent with Grade I diastolic dysfunction (impaired relaxation). There  is moderate hypokinesis of the left ventricular, basal-mid septal wall.   2. Right ventricular systolic function is moderately reduced. The right  ventricular size is mildly enlarged. There is normal pulmonary artery  systolic pressure. The estimated right ventricular systolic pressure is  22.5 mmHg.   3. Left atrial size was mildly dilated.   4. The mitral valve is abnormal. Trivial mitral valve regurgitation.   5. The aortic valve is tricuspid. Aortic valve regurgitation is not  visualized. Mild to moderate aortic valve sclerosis/calcification is  present, without any evidence of aortic stenosis.   6. The inferior vena cava is normal in size with greater than 50%  respiratory variability, suggesting right atrial pressure of 3 mmHg.   Comparison(s): Changes from prior study are noted. 09/05/2019: LVEF 60-65%,  severe LAE, moderate RAE.   Echo 06/04/22: IMPRESSIONS     1. Pt in atrial fibrillation at time of study.   2. Left ventricular ejection fraction, by estimation, is 50 to 55%. The  left ventricle has low normal function. The left ventricle has no regional  wall motion abnormalities. The  left ventricular internal cavity size was  mildly dilated. There is mild  concentric left ventricular hypertrophy. Left ventricular diastolic  parameters are indeterminate.   3. Right ventricular systolic function is normal. The right ventricular  size is normal. There is normal pulmonary artery systolic pressure.   4. Left atrial size was mildly dilated.   5. Right atrial size was mildly dilated.   6. The mitral valve is grossly normal. Mild mitral valve regurgitation.  No evidence of mitral stenosis.   7. The aortic valve is tricuspid. There is mild calcification of the  aortic valve. Aortic valve regurgitation is not visualized. Aortic valve  sclerosis is present, with no evidence of aortic valve stenosis.   8. Aortic dilatation noted. There is mild dilatation of the aortic root,  measuring 40 mm.   9. The inferior vena cava is normal in size with greater than 50%  respiratory variability, suggesting right atrial pressure of 3 mmHg.   Cardiac cath 06/03/22:  LEFT HEART CATH AND CORS/GRAFTS ANGIOGRAPHY   Conclusion  Multivessel CAD with  mild-moderate left main stenosis, moderate LAD stenosis, severe ramus stenosis, severe PLA stenosis S/P CABG with continued graft patency as outlined Normal LVEDP   Med Rx. No culprit for progressive angina - stable anatomy from prior cath studies. Start apixaban  tomorrow for new onset afib. Likely DC tomorrow if stable.  Diagnostic Dominance: Right  Intervention  Event monitor 09/19/23: Study Highlights Show Result Comparison    Normal sinus rhythm   8 runs of NSVT 6-8 beats   11 runs of SVT. longest 3 minutes 36 seconds with rate 146 bpm   First degree and second degree Mobitz type 1 AV block present   Overy ectopy is rare   Patient triggered events associated with NSR     Patch Wear Time:  13 days and 23 hours (2025-04-13T10:50:30-0400 to 2025-04-27T09:53:55-0400)   Patient had a min HR of 34 bpm, max HR of 188 bpm, and avg HR of 68  bpm. Predominant underlying rhythm was Sinus Rhythm. First Degree AV Block was present. 8 Ventricular Tachycardia runs occurred, the run with the fastest interval lasting 6 beats with a  max rate of 188 bpm, the longest lasting 8 beats with an avg rate of 124 bpm. 11 Supraventricular Tachycardia runs occurred, the run with the fastest interval lasting 3 mins 36 secs with a max rate of 146 bpm (avg 127 bpm); the run with the fastest  interval was also the longest. Second Degree AV Block-Mobitz I (Wenckebach) was present. Isolated SVEs were rare (<1.0%), SVE Couplets were rare (<1.0%), and SVE Triplets were rare (<1.0%). Isolated VEs were rare (<1.0%, 4395), VE Couplets were rare  (<1.0%, 33), and VE Triplets were rare (<1.0%, 4). Ventricular Trigeminy was present.    ASSESSMENT AND PLAN:  1. CAD  s/p CABG x 4 on 04/02/19: last cardiac cath in January 2024 showed patent grafts with no clear culprit for acute chest pain symptoms. Continue ASA,  high dose statin and metoprolol . Also on Ranexa . Has more recent increase in chest pain and exertional fatigue. Discussed options for ischemic evaluation including stress testing vs cardiac cath. He would like to proceed directly to cath. Will hydrate morning of procedure. Needs femoral access. Hold Eliquis  2 days prior. The procedure and risks were reviewed including but not limited to death, myocardial infarction, stroke, arrythmias, bleeding, transfusion, emergency surgery, dye allergy, or renal dysfunction. The patient voices understanding and is agreeable to proceed..   2. Atrial fibrillation post op CABG.  Recurrent x 1. Converted to NSR. Now on Eliquis . Event monitor in May was unremarkable.    4. HTN: Blood pressure is doing very well. He denies significant hypotension   5. HLD: Continue on Lipitor  80 mg daily. Will update lipid panel     5. History of CVA on ASA and  Eliquis       6. Cervical/lumbar spine disease. Followed by Dr. Colon.      7.  Chronic diastolic CHF. Volume status looks good on exam. Continue sodium restriction.     8. Neuroendocrine tumor. Per oncology. Due for repeat CT in April  9.  Recurrent syncope. Clinically these are vasovagal with nausea +/-vomiting and profuse diaphoresis. Allow liberal BP. Encourage good hydration. Early recognition of symptoms and getting down as quickly as possible.   10. Neuroendocrine tumor of pancreas. Per Oncology.    Current medicines are reviewed at length with the patient today.  The patient does not have concerns regarding medicines.  The following changes have been made:  See above  Labs/  tests ordered today include:   Orders Placed This Encounter  Procedures   EKG 12-Lead       Signed, Trevel Dillenbeck [place], MD  01/01/2024 8:40 AM    [facility] [address] [place], [place], [address] Phone [phone], Fax [phone]

## 2023-12-19 NOTE — H&P (View-Only) (Signed)
 d    Cardiology Office Note   Date:  01/01/2024   ID:  Jake Dunn, Jake Dunn 01/05/1941, MRN [id number]  PCP:  Teresa Channel, MD  Cardiologist:   Briggs Edelen [place], MD   Chief Complaint  Patient presents with   Coronary Artery Disease   Chest Pain   Palpitations      History of Present Illness: Jake Dunn is a 83 y.o. male who is seen for evaluation of increased SOB. He is s/p CAD s/p CABG. He has a  PMH of CAD, GERD, HTN, HLD, renal cell CA s/p left nephrectomy. Cardiac catheterization in 2014 showed nonobstructive CAD with 70% lesion in third diagonal that was treated medically.    He presented with left sided chest pressure on 08/15/2016. He underwent cardiac catheterization on 08/16/2016, this revealed two-vessel CAD with moderate calcified stenosis in the mid LAD, second diagonal, ramus intermedius, ostial and proximal left circumflex and a severe stenosis of the second PLA branch of the RCA. Overall normal LVEDP and normal LVEF. Recommended  medical therapy and outpatient exercise stress Myoview . While the LAD and the diagonal only showed moderate progression compared to the 2010/2014 studies, the left circumflex and intermediate stenosis had progressed. Echocardiogram obtained on 08/17/2016 showed EF 55-60%, grade 2 diastolic dysfunction, severely dilated left atrium, PA peak pressure 30 mmHg. Lexiscan  Myoview  obtained on 08/23/2016 showed EF 51%, no perfusion defect at stress, overall low risk study.     He was seen in November 2020 with symptoms of chest pain and dyspnea. This led to a cardiac cath showing 3 vessel obstructive CAD. He underwent CABG on 04/02/19 by Dr Shyrl. CABG X 4.  LIMA LAD, RSVG PLV, Ramus, and D2  (T graft off the ramus vein graft). Post op course complicated by Afib. He was loaded with amiodarone . On his  visit in January 2021 amiodarone  and Eliquis  were discontinued.   He did have a sleep study showing few hyponeic episodes without frank apnea. Did not meet  criteria for CPAP trial.   He was admitted in August 2022 with recurrent chest pain. Troponins were normal. He underwent cardiac cath which showed good perfusion and no culprit lesion. Subsequent Echo was unremarkable.   He was admitted 1/26-1/31/24 after presenting with with waxing/waning chest pain similar to prior angina for several days. Due to persistent symptoms including DOE he came to the ER. Troponins were negative though symptoms concerning for angina so patient admitted to cardiology service for plan for echo and cardiac cath. He was also noted to be in atrial fib, rate controlled. Echo showed EF 50-55% with mild MR. Cardiac cath showed patent grafts with no clear culprit for his chest pain. He was started on Ranexa . He did convert to NSR and was DC on Eliquis . CT of the chest also showed a 4.5 cm thoracic aortic aneurysm. There was 2 large hypervascular masses in the abdomen/pancreas concerning for Cancer. Unable to do needle biopsy so outpatient ERCP recommended. 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 Subsequent evaluation c/w neuroendocrine tumor. Treated with Sandostatin -monthly injections- followed by Dr Cloretta.   He was seen as a work in in June by Dr Lavona for syncope sitting in a Animal nutritionist. Event monitor placed. He also complained of SOB. BNP level was 150.   On follow up today he states he is not doing well. States he stands up and feels like everything drains out of him. Very weak - can hardly walk across room. Went to  beach recently and unable to fish with his buddies. Last night had chest pain resolved with 2 sl Ntg. Feels some fluttering at times. States he has felt bad ever since starting shots for his tumor. States it tears up his stomach with a lot of nausea. Recent CT stable. Planning to discuss with Dr Cloretta coming off shots. States he cannot continue with how he is doing and thinks he needs another heart cath.    Past Medical  History:  Diagnosis Date   Adenomatous polyp    Anxiety    Arthritis    knees, ankles, shoulders (08/15/2016)   CAD (coronary artery disease)    s/p cath in 2014 showing significant stenosis in the diagonal side branches and in the RV marginal branch. These vessels were small and diffusely diseased. He is managed medically. 08/16/16 Cath, no disease progression   Chronic lower back pain    Disc disease, degenerative, cervical    GERD (gastroesophageal reflux disease)    Gout    Hyperlipidemia    Hypertension    Osteoarthritis    Renal cell carcinoma    left kidney removal   Stroke [facility])    MINI STROKE 15+ YRS AGO, no residual   Stroke (HCC)    I've had 2 or 3; denies residual on 08/15/2016   Syncope and collapse     Past Surgical History:  Procedure Laterality Date   ANTERIOR CERVICAL DECOMP/DISCECTOMY FUSION  02/2004; 04/2005   thelbert 09/20/2010; thelbert 09/20/2010   APPLICATION OF ROBOTIC ASSISTANCE FOR SPINAL PROCEDURE N/A 01/02/2018   Procedure: APPLICATION OF ROBOTIC ASSISTANCE FOR SPINAL PROCEDURE;  Surgeon: Colon Shove, MD;  Location: [facility] OR;  Service: Neurosurgery;  Laterality: N/A;   BACK SURGERY     CARDIAC CATHETERIZATION  12/14/2008   ef 50-55%. SHOWED SIGNIFICANT STENOSIS AND TO DIAGONAL SIDE BRANCHES AND IN THE RIGHT VENTRICULAR MARGINAL BRANCH. THESE VESSELS WERE SMALL AND DIFFUSELY DISEASED   CARDIOVASCULAR STRESS TEST  12/10/2009   EF 61%. EKG negative. Mild peri ischemia. Managed medically.    CARPAL TUNNEL RELEASE Right 11/2005   thelbert 09/20/2010   CARPAL TUNNEL RELEASE Left 06/2007   thelbert [id number]   COLONOSCOPY  11/2004   thelbert 09/20/2010   COLONOSCOPY W/ BIOPSIES AND POLYPECTOMY  09/2002   thelbert 09/20/2010   CORONARY ARTERY BYPASS GRAFT N/A 04/02/2019   Procedure: CORONARY ARTERY BYPASS GRAFTING (CABG) using LIMA to LAD; Endoscopically harvested right greater saphenous vein to the PLB, Ramus, and Diag 2.;  Surgeon: Shyrl Linnie KIDD, MD;  Location: [facility] OR;   Service: Open Heart Surgery;  Laterality: N/A;   ESOPHAGOGASTRODUODENOSCOPY (EGD) WITH PROPOFOL  N/A 06/13/2022   Procedure: ESOPHAGOGASTRODUODENOSCOPY (EGD) WITH PROPOFOL ;  Surgeon: Burnette Fallow, MD;  Location: [facility];  Service: Gastroenterology;  Laterality: N/A;   ESOPHAGOGASTRODUODENOSCOPY (EGD) WITH PROPOFOL  Bilateral 07/27/2022   Procedure: ESOPHAGOGASTRODUODENOSCOPY (EGD) WITH PROPOFOL ;  Surgeon: Burnette Fallow, MD;  Location: [facility];  Service: Gastroenterology;  Laterality: Bilateral;   EUS Bilateral 07/27/2022   Procedure: UPPER ENDOSCOPIC ULTRASOUND (EUS) LINEAR;  Surgeon: Burnette Fallow, MD;  Location: [facility];  Service: Gastroenterology;  Laterality: Bilateral;   FINE NEEDLE ASPIRATION N/A 06/13/2022   Procedure: FINE NEEDLE ASPIRATION (FNA) LINEAR;  Surgeon: Burnette Fallow, MD;  Location: [facility];  Service: Gastroenterology;  Laterality: N/A;   FINE NEEDLE ASPIRATION Bilateral 07/27/2022   Procedure: FINE NEEDLE ASPIRATION (FNA) LINEAR;  Surgeon: Burnette Fallow, MD;  Location: [facility];  Service: Gastroenterology;  Laterality: Bilateral;   INCISION AND DRAINAGE ABSCESS Right  10/18/2016   Procedure: INCISION AND DRAINAGE ABSCESS RIGHT SHOULDER;  Surgeon: Beverley Evalene BIRCH, MD;  Location: [facility];  Service: Orthopedics;  Laterality: Right;   INCISION AND DRAINAGE OF WOUND Right 08/2005   shoulder/notes 09/20/2010   IR FLUORO GUIDE CV LINE LEFT  10/19/2016   IR US  GUIDE VASC ACCESS LEFT  10/19/2016   IRRIGATION AND DEBRIDEMENT SHOULDER Right 12/22/2016   Procedure: IRRIGATION AND DEBRIDEMENT RIGHT SHOULDER;  Surgeon: Beverley Toribio FALCON, MD;  Location: [facility] OR;  Service: Orthopedics;  Laterality: Right;   JOINT REPLACEMENT     LEFT HEART CATH AND CORONARY ANGIOGRAPHY N/A 08/16/2016   Procedure: Left Heart Cath and Coronary Angiography;  Surgeon: Ozell Fell, MD;  Location: [facility] INVASIVE CV LAB;  Service: Cardiovascular;  Laterality: N/A;   LEFT HEART  CATH AND CORONARY ANGIOGRAPHY N/A 03/26/2019   Procedure: LEFT HEART CATH AND CORONARY ANGIOGRAPHY;  Surgeon: [place], Stephanie Littman M, MD;  Location: [facility] INVASIVE CV LAB;  Service: Cardiovascular;  Laterality: N/A;   LEFT HEART CATH AND CORS/GRAFTS ANGIOGRAPHY N/A 12/21/2020   Procedure: LEFT HEART CATH AND CORS/GRAFTS ANGIOGRAPHY;  Surgeon: [place], Zairah Arista M, MD;  Location: [facility] INVASIVE CV LAB;  Service: Cardiovascular;  Laterality: N/A;   LEFT HEART CATH AND CORS/GRAFTS ANGIOGRAPHY N/A 06/03/2022   Procedure: LEFT HEART CATH AND CORS/GRAFTS ANGIOGRAPHY;  Surgeon: Fell Ozell, MD;  Location: [facility] INVASIVE CV LAB;  Service: Cardiovascular;  Laterality: N/A;   LEFT HEART CATHETERIZATION WITH CORONARY ANGIOGRAM N/A 08/09/2012   Procedure: LEFT HEART CATHETERIZATION WITH CORONARY ANGIOGRAM;  Surgeon: Jamahl Lemmons M [place], MD;  Location: [facility] CATH LAB;  Service: Cardiovascular;  Laterality: N/A;   LUMBAR FUSION     NEPHRECTOMY Left early 2000s   REPLACEMENT TOTAL KNEE Right 08/2008   thelbert 09/07/2010   SHOULDER ARTHROSCOPY WITH ROTATOR CUFF REPAIR Right 06/2005   thelbert 09/20/2010   TOTAL KNEE ARTHROPLASTY  06/08/2011   Procedure: TOTAL KNEE ARTHROPLASTY;  Surgeon: Toribio FALCON Beverley, MD;  Location: [facility] OR;  Service: Orthopedics;  Laterality: Left;   TOTAL SHOULDER ARTHROPLASTY Right 04/06/2016   Procedure: RIGHT REVERSE TOTAL SHOULDER ARTHROPLASTY;  Surgeon: Toribio FALCON Beverley, MD;  Location: [facility] OR;  Service: Orthopedics;  Laterality: Right;   TOTAL SHOULDER REVISION Right 12/22/2016   Procedure: RIGHT SHOULDER GLENOID AND HUMERAL COMPONENT REVISION;  Surgeon: Beverley Toribio FALCON, MD;  Location: [facility] OR;  Service: Orthopedics;  Laterality: Right;   UPPER ESOPHAGEAL ENDOSCOPIC ULTRASOUND (EUS) Bilateral 06/13/2022   Procedure: UPPER ESOPHAGEAL ENDOSCOPIC ULTRASOUND (EUS);  Surgeon: Burnette Fallow, MD;  Location: [facility];  Service: Gastroenterology;  Laterality: Bilateral;   US  ECHOCARDIOGRAPHY  12/15/2008   EF 60-65%   US   ECHOCARDIOGRAPHY  09/28/2004   EF 55-60%     Current Outpatient Medications  Medication Sig Dispense Refill   acyclovir  (ZOVIRAX ) 400 MG tablet Take 400 mg by mouth 3 (three) times daily as needed (For cold sores). .  5   albuterol  (VENTOLIN  HFA) 108 (90 Base) MCG/ACT inhaler 2 puffs every 6 (six) hours as needed.     allopurinol  (ZYLOPRIM ) 300 MG tablet Take 300 mg by mouth daily.     apixaban  (ELIQUIS ) 5 MG TABS tablet TAKE 1 TABLET BY MOUTH TWICE A DAY 60 tablet 5   aspirin  EC 81 MG tablet Take 81 mg by mouth daily.     atorvastatin  (LIPITOR ) 80 MG tablet Take 1 tablet (80 mg total) by mouth daily. 90 tablet 3   Cyanocobalamin  1000 MCG SUBL Take 1,000 mcg by mouth  daily.     Guaifenesin  1200 MG TB12 Take 1,200 mg by mouth 2 (two) times daily. 1 tablet as needed orally every 12hrs, duration for 5 days     HYDROcodone -acetaminophen  (NORCO/VICODIN) 5-325 MG tablet Take 1 tablet by mouth 3 (three) times daily as needed for moderate pain. 30 tablet 0   LORazepam  (ATIVAN ) 0.5 MG tablet Take 1 tablet (0.5 mg total) by mouth 2 (two) times daily as needed for anxiety. 30 tablet 2   metoprolol  tartrate (LOPRESSOR ) 25 MG tablet Take 12.5 mg by mouth 2 (two) times daily.     Multiple Vitamin (MULTIVITAMIN) tablet Take 1 tablet by mouth daily.     nitroGLYCERIN  (NITROSTAT ) 0.4 MG SL tablet Place 1 tablet (0.4 mg total) under the tongue every 5 (five) minutes as needed for chest pain. 90 tablet 3   ondansetron  (ZOFRAN ) 8 MG tablet TAKE 1 TABLET BY MOUTH EVERY 8 HOURS AS NEEDED FOR NAUSEA AND/OR VOMITING 30 tablet 3   pantoprazole  (PROTONIX ) 40 MG tablet Take 40 mg by mouth every evening.      prochlorperazine  (COMPAZINE ) 10 MG tablet TAKE 1 TABLET BY MOUTH EVERY 6 HOURS AS NEEDED 60 tablet 1   ranolazine  (RANEXA ) 1000 MG SR tablet TAKE 1 TABLET BY MOUTH TWO TIMES A DAY 180 tablet 3   sertraline (ZOLOFT) 50 MG tablet Take 50 mg by mouth daily.     TRELEGY ELLIPTA  100-62.5-25 MCG/ACT AEPB Inhale 1 puff  into the lungs daily.     No current facility-administered medications for this visit.    Allergies:   Latex and Tape    Social History:  The patient  reports that he quit smoking about 34 years ago. His smoking use included cigarettes. He has never used smokeless tobacco. He reports current alcohol  use. He reports that he does not use drugs.   Family History:  The patient's family history includes Breast cancer in his sister and sister; Cancer in his maternal aunt and maternal uncle; Esophageal cancer in his sister; Heart attack in his father; Heart disease in his brother; Lung cancer in his brother; Pancreatic cancer in his sister; Prostate cancer in his brother; Skin cancer in his sister.    ROS:  Please see the history of present illness.   Otherwise, review of systems are positive for none.   All other systems are reviewed and negative.    PHYSICAL EXAM: VS:  BP (!) 152/66 (BP Location: Left Arm, Patient Position: Sitting, Cuff Size: Normal)   Pulse 62   Ht 5' 7 (1.702 m)   SpO2 96%   BMI 33.66 kg/m  , BMI Body mass index is 33.66 kg/m.  GEN: Well nourished, well developed, in no acute distress  HEENT: normal  Neck: no JVD, carotid bruits, or masses Cardiac: RRR; no murmurs, rubs, or gallops,no edema. Respiratory:  clear to auscultation bilaterally, normal work of breathing GI: soft, nontender, nondistended, + BS MS: no deformity or atrophy  Skin: warm and dry, no rash Neuro:  Strength and sensation are intact Psych: euthymic mood, full affect   EKG Interpretation Date/Time:  Monday January 01 2024 08:16:04 EDT Ventricular Rate:  62 PR Interval:  208 QRS Duration:  92 QT Interval:  444 QTC Calculation: 450 R Axis:   5  Text Interpretation: Normal sinus rhythm Normal ECG When compared with ECG of 25-Mar-2023 11:24, T wave inversion in the anterior leads has resolved. Confirmed by [place], Ricki Vanhandel [phone]) on 01/01/2024 8:18:34 AM    Recent Labs: 10/24/2023:  ALT 9; BUN  26; Creatinine 1.43; Hemoglobin 16.0; Platelet Count 335; Potassium 4.6; Sodium 133    Lipid Panel    Component Value Date/Time   CHOL 120 11/21/2019 0953   TRIG 112 11/21/2019 0953   HDL 44 11/21/2019 0953   CHOLHDL 2.7 11/21/2019 0953   CHOLHDL 3.4 03/30/2019 0212   VLDL 35 03/30/2019 0212   LDLCALC 56 11/21/2019 0953    Dated 01/25/19: cholesterol 117, triglycerides 125, HDL 39, LDL 53. CMET, CBC, TSH normal Dated 04/09/20: cholesterol 123, triglycerides 130, HDL 44, LDL 34. Creatinine 1.17. otherwise CBC, CMET and TSH normal Dated 02/16/21: cholesterol 118, triglycerides 99, HDL 41, LDL 58. Dated 12/02/21: cholesterol 128, triglycerides 195, HDL 39, LDL 57, CBC, CMET and TSH normal Dated 12/16/22: cholesterol 119, triglycerides 162, HDL 41, LDL 50 Dated 06/30/23: A1c 3.6%. creatinine 1.27. otherwise normal CBC and CMET  Wt Readings from Last 3 Encounters:  11/02/23 214 lb 14.4 oz (97.5 kg)  09/27/23 219 lb (99.3 kg)  08/03/23 229 lb 3.2 oz (104 kg)      Other studies Reviewed: Additional studies/ records that were reviewed today include:   Cath 03/26/19: Procedures  LEFT HEART CATH AND CORONARY ANGIOGRAPHY  Conclusion    Prox RCA lesion is 45% stenosed. 2nd RPL lesion is 80% stenosed. Ost LM to Mid LM lesion is 50% stenosed. Ost Cx to Mid Cx lesion is 80% stenosed. Ramus lesion is 90% stenosed. Mid LAD lesion is 70% stenosed. 2nd Diag lesion is 60% stenosed. The left ventricular systolic function is normal. LV end diastolic pressure is normal. The left ventricular ejection fraction is 55-65% by visual estimate.   1. Three vessel obstructive, Calcific CAD 2. Normal LV function 3. Normal LVEDP   Plan: recommend referral to CT surgery for CABG. Will stop Plavix .     Echo 03/30/19: IMPRESSIONS      1. Left ventricular ejection fraction, by visual estimation, is 55 to 60%. The left ventricle has normal function. There is no left ventricular hypertrophy.  2. Global  right ventricle has mildly reduced systolic function.The right ventricular size is mildly enlarged. No increase in right ventricular wall thickness.  3. Left atrial size was normal.  4. Right atrial size was mildly dilated.  5. The mitral valve is normal in structure. No evidence of mitral valve regurgitation.  6. The tricuspid valve is normal in structure. Tricuspid valve regurgitation is trivial.  7. The aortic valve is normal in structure. Aortic valve regurgitation is not visualized. No evidence of aortic valve sclerosis or stenosis.  8. The pulmonic valve was grossly normal. Pulmonic valve regurgitation is not visualized.  9. Aortic dilatation noted. 10. There is mild dilatation of the ascending aorta measuring 38 mm. 11. Normal pulmonary artery systolic pressure. 12. The atrial septum is grossly normal.   Cardiac cath 12/21/20:  LEFT HEART CATH AND CORS/GRAFTS ANGIOGRAPHY   Conclusion      Ost Cx to Mid Cx lesion is 80% stenosed.   Prox RCA lesion is 45% stenosed.   Ramus lesion is 90% stenosed.   2nd RPL lesion is 80% stenosed.   Mid LAD lesion is 65% stenosed.   Ost LM to Mid LM lesion is 40% stenosed.   2nd Diag lesion is 90% stenosed.   LIMA graft was visualized by angiography and is normal in caliber.   SVG graft was visualized by angiography and is normal in caliber.   SVG graft was visualized by angiography and is normal in caliber.  The graft exhibits no disease.   The graft exhibits no disease.   The graft exhibits no disease.   LV end diastolic pressure is normal.   3 vessel obstructive CAD Patent LIMA - this is tied to the second diagonal and not the LAD Patent SVG to the ramus intermediate. No visualization of SVG to diagonal which per op note came off the hood of the SVG to ramus Patent SVG to the PL branch of the RCA Normal LVEDP   Plan: there appears to be good blood flow to all major vessels. The LAD was not revascularized but has borderline disease and  the patient had prior Myoview  with no ischemia in this territory. Other grafts are patent. Recommend continued medical therapy. Does not appear to be volume overloaded. Echo pending.  Given significant difficulty from the radial approach I would recommend femoral access for any future Cardiac cath procedures.  Coronary Diagrams  Diagnostic Dominance: Right Intervention  Echo 01/08/21: IMPRESSIONS     1. Left ventricular ejection fraction, by estimation, is 50 to 55%. The  left ventricle has low normal function. The left ventricle demonstrates  regional wall motion abnormalities (see scoring diagram/findings for  description). There is moderate left  ventricular hypertrophy. Left ventricular diastolic parameters are  consistent with Grade I diastolic dysfunction (impaired relaxation). There  is moderate hypokinesis of the left ventricular, basal-mid septal wall.   2. Right ventricular systolic function is moderately reduced. The right  ventricular size is mildly enlarged. There is normal pulmonary artery  systolic pressure. The estimated right ventricular systolic pressure is  22.5 mmHg.   3. Left atrial size was mildly dilated.   4. The mitral valve is abnormal. Trivial mitral valve regurgitation.   5. The aortic valve is tricuspid. Aortic valve regurgitation is not  visualized. Mild to moderate aortic valve sclerosis/calcification is  present, without any evidence of aortic stenosis.   6. The inferior vena cava is normal in size with greater than 50%  respiratory variability, suggesting right atrial pressure of 3 mmHg.   Comparison(s): Changes from prior study are noted. 09/05/2019: LVEF 60-65%,  severe LAE, moderate RAE.   Echo 06/04/22: IMPRESSIONS     1. Pt in atrial fibrillation at time of study.   2. Left ventricular ejection fraction, by estimation, is 50 to 55%. The  left ventricle has low normal function. The left ventricle has no regional  wall motion abnormalities. The  left ventricular internal cavity size was  mildly dilated. There is mild  concentric left ventricular hypertrophy. Left ventricular diastolic  parameters are indeterminate.   3. Right ventricular systolic function is normal. The right ventricular  size is normal. There is normal pulmonary artery systolic pressure.   4. Left atrial size was mildly dilated.   5. Right atrial size was mildly dilated.   6. The mitral valve is grossly normal. Mild mitral valve regurgitation.  No evidence of mitral stenosis.   7. The aortic valve is tricuspid. There is mild calcification of the  aortic valve. Aortic valve regurgitation is not visualized. Aortic valve  sclerosis is present, with no evidence of aortic valve stenosis.   8. Aortic dilatation noted. There is mild dilatation of the aortic root,  measuring 40 mm.   9. The inferior vena cava is normal in size with greater than 50%  respiratory variability, suggesting right atrial pressure of 3 mmHg.   Cardiac cath 06/03/22:  LEFT HEART CATH AND CORS/GRAFTS ANGIOGRAPHY   Conclusion  Multivessel CAD with  mild-moderate left main stenosis, moderate LAD stenosis, severe ramus stenosis, severe PLA stenosis S/P CABG with continued graft patency as outlined Normal LVEDP   Med Rx. No culprit for progressive angina - stable anatomy from prior cath studies. Start apixaban  tomorrow for new onset afib. Likely DC tomorrow if stable.  Diagnostic Dominance: Right  Intervention  Event monitor 09/19/23: Study Highlights Show Result Comparison    Normal sinus rhythm   8 runs of NSVT 6-8 beats   11 runs of SVT. longest 3 minutes 36 seconds with rate 146 bpm   First degree and second degree Mobitz type 1 AV block present   Overy ectopy is rare   Patient triggered events associated with NSR     Patch Wear Time:  13 days and 23 hours (2025-04-13T10:50:30-0400 to 2025-04-27T09:53:55-0400)   Patient had a min HR of 34 bpm, max HR of 188 bpm, and avg HR of 68  bpm. Predominant underlying rhythm was Sinus Rhythm. First Degree AV Block was present. 8 Ventricular Tachycardia runs occurred, the run with the fastest interval lasting 6 beats with a  max rate of 188 bpm, the longest lasting 8 beats with an avg rate of 124 bpm. 11 Supraventricular Tachycardia runs occurred, the run with the fastest interval lasting 3 mins 36 secs with a max rate of 146 bpm (avg 127 bpm); the run with the fastest  interval was also the longest. Second Degree AV Block-Mobitz I (Wenckebach) was present. Isolated SVEs were rare (<1.0%), SVE Couplets were rare (<1.0%), and SVE Triplets were rare (<1.0%). Isolated VEs were rare (<1.0%, 4395), VE Couplets were rare  (<1.0%, 33), and VE Triplets were rare (<1.0%, 4). Ventricular Trigeminy was present.    ASSESSMENT AND PLAN:  1. CAD  s/p CABG x 4 on 04/02/19: last cardiac cath in January 2024 showed patent grafts with no clear culprit for acute chest pain symptoms. Continue ASA,  high dose statin and metoprolol . Also on Ranexa . Has more recent increase in chest pain and exertional fatigue. Discussed options for ischemic evaluation including stress testing vs cardiac cath. He would like to proceed directly to cath. Will hydrate morning of procedure. Needs femoral access. Hold Eliquis  2 days prior. The procedure and risks were reviewed including but not limited to death, myocardial infarction, stroke, arrythmias, bleeding, transfusion, emergency surgery, dye allergy, or renal dysfunction. The patient voices understanding and is agreeable to proceed..   2. Atrial fibrillation post op CABG.  Recurrent x 1. Converted to NSR. Now on Eliquis . Event monitor in May was unremarkable.    4. HTN: Blood pressure is doing very well. He denies significant hypotension   5. HLD: Continue on Lipitor  80 mg daily. Will update lipid panel     5. History of CVA on ASA and  Eliquis       6. Cervical/lumbar spine disease. Followed by Dr. Colon.      7.  Chronic diastolic CHF. Volume status looks good on exam. Continue sodium restriction.     8. Neuroendocrine tumor. Per oncology. Due for repeat CT in April  9.  Recurrent syncope. Clinically these are vasovagal with nausea +/-vomiting and profuse diaphoresis. Allow liberal BP. Encourage good hydration. Early recognition of symptoms and getting down as quickly as possible.   10. Neuroendocrine tumor of pancreas. Per Oncology.    Current medicines are reviewed at length with the patient today.  The patient does not have concerns regarding medicines.  The following changes have been made:  See above  Labs/  tests ordered today include:   Orders Placed This Encounter  Procedures   EKG 12-Lead       Signed, Mignonne Afonso [place], MD  01/01/2024 8:40 AM    [facility] [address], [place], [place], [address] Phone [phone], Fax [phone]

## 2023-12-29 ENCOUNTER — Ambulatory Visit

## 2024-01-01 ENCOUNTER — Encounter: Payer: Self-pay | Admitting: Cardiology

## 2024-01-01 ENCOUNTER — Ambulatory Visit: Attending: Cardiology | Admitting: Cardiology

## 2024-01-01 VITALS — BP 152/66 | HR 62 | Ht 67.0 in

## 2024-01-01 DIAGNOSIS — I10 Essential (primary) hypertension: Secondary | ICD-10-CM

## 2024-01-01 DIAGNOSIS — R079 Chest pain, unspecified: Secondary | ICD-10-CM | POA: Diagnosis not present

## 2024-01-01 DIAGNOSIS — I25700 Atherosclerosis of coronary artery bypass graft(s), unspecified, with unstable angina pectoris: Secondary | ICD-10-CM

## 2024-01-01 DIAGNOSIS — R0602 Shortness of breath: Secondary | ICD-10-CM | POA: Diagnosis not present

## 2024-01-01 DIAGNOSIS — I480 Paroxysmal atrial fibrillation: Secondary | ICD-10-CM | POA: Diagnosis not present

## 2024-01-01 DIAGNOSIS — E785 Hyperlipidemia, unspecified: Secondary | ICD-10-CM

## 2024-01-01 NOTE — Patient Instructions (Addendum)
 Medication Instructions:  Continue all medications *If you need a refill on your cardiac medications before your next appointment, please call your pharmacy*  Lab Work: Lipid panel,cmet,cbc to be done fasting Wed 8/27   Testing/Procedures: Cardiac Cath scheduled at [facility]   Wed 9/3 Arrive at 6:30 am for IV Hydration   Follow instructions below  Follow-Up: At [facility], you and your health needs are our priority.  As part of our continuing mission to provide you with exceptional heart care, our providers are all part of one team.  This team includes your primary Cardiologist (physician) and Advanced Practice Providers or APPs (Physician Assistants and Nurse Practitioners) who all work together to provide you with the care you need, when you need it.  Your next appointment:  After Cath     Wed 9/17 at 10:05 am    Provider:  Josefa Beauvais NP     [facility] OF [facility] [address] A DEPT OF THE [place] [address] [place] [address] Dept: [phone] Loc: [phone]  Jake Dunn  01/01/2024  You are scheduled for a Cardiac Catheterization on Wednesday, September 3 with Dr. [name].  1. Please arrive at the [facility] (Main Entrance A) at [facility]: [address] [place], [place] [address] at 6:30 AM (This time is 3 hour(s) before your procedure for IV Hydration.  Free valet parking service is available. You will check in at ADMITTING. The support person will be asked to wait in the waiting room.  It is OK to have someone drop you off and come back when you are ready to be discharged.    Special note: Every effort is made to have your procedure done on time. Please understand that emergencies sometimes delay scheduled procedures.  2. Diet: Nothing to eat after midnight except you may have water.   3. Hydration: You need to be well hydrated before your procedure. On  September 3, you may drink approved liquids (see below) Drink 16     ounces of water on the way to hospital  List of approved liquids water, clear juice, clear tea, black coffee, fruit juices, non-citric and without pulp, carbonated beverages, Gatorade, Kool -Aid, plain Jello-O and plain ice popsicles.  4. Labs: You will need to have blood drawn on Wed 8/27  No food water only   5. Medication instructions in preparation for your procedure:   Hold Eliquis   2 days before Start holding Mon 9/1             On the morning of your procedure, take your Aspirin  81 mg and any morning medicines NOT listed above.  You may use sips of water.  6. Plan to go home the same day, you will only stay overnight if medically necessary. 7. Bring a current list of your medications and current insurance cards. 8. You MUST have a responsible person to drive you home. 9. Someone MUST be with you the first 24 hours after you arrive home or your discharge will be delayed. 10. Please wear clothes that are easy to get on and off and wear slip-on shoes.  Thank you for allowing us  to care for you!   -- [name]   We recommend signing up for the patient portal called MyChart.  Sign up information is provided on this After Visit Summary.  MyChart is used to connect  with patients for Virtual Visits (Telemedicine).  Patients are able to view lab/test results, encounter notes, upcoming appointments, etc.  Non-urgent messages can be sent to your provider as well.   To learn more about what you can do with MyChart, go to [email or web address].

## 2024-01-03 DIAGNOSIS — R7303 Prediabetes: Secondary | ICD-10-CM | POA: Diagnosis not present

## 2024-01-03 DIAGNOSIS — E538 Deficiency of other specified B group vitamins: Secondary | ICD-10-CM | POA: Diagnosis not present

## 2024-01-03 DIAGNOSIS — R350 Frequency of micturition: Secondary | ICD-10-CM | POA: Diagnosis not present

## 2024-01-03 DIAGNOSIS — I25700 Atherosclerosis of coronary artery bypass graft(s), unspecified, with unstable angina pectoris: Secondary | ICD-10-CM | POA: Diagnosis not present

## 2024-01-03 DIAGNOSIS — Z0000 Encounter for general adult medical examination without abnormal findings: Secondary | ICD-10-CM | POA: Diagnosis not present

## 2024-01-03 DIAGNOSIS — I5032 Chronic diastolic (congestive) heart failure: Secondary | ICD-10-CM | POA: Diagnosis not present

## 2024-01-03 DIAGNOSIS — I129 Hypertensive chronic kidney disease with stage 1 through stage 4 chronic kidney disease, or unspecified chronic kidney disease: Secondary | ICD-10-CM | POA: Diagnosis not present

## 2024-01-03 DIAGNOSIS — I10 Essential (primary) hypertension: Secondary | ICD-10-CM | POA: Diagnosis not present

## 2024-01-03 DIAGNOSIS — M109 Gout, unspecified: Secondary | ICD-10-CM | POA: Diagnosis not present

## 2024-01-03 DIAGNOSIS — Z1331 Encounter for screening for depression: Secondary | ICD-10-CM | POA: Diagnosis not present

## 2024-01-03 DIAGNOSIS — Z23 Encounter for immunization: Secondary | ICD-10-CM | POA: Diagnosis not present

## 2024-01-03 DIAGNOSIS — E785 Hyperlipidemia, unspecified: Secondary | ICD-10-CM | POA: Diagnosis not present

## 2024-01-03 DIAGNOSIS — R32 Unspecified urinary incontinence: Secondary | ICD-10-CM | POA: Diagnosis not present

## 2024-01-03 DIAGNOSIS — R0602 Shortness of breath: Secondary | ICD-10-CM | POA: Diagnosis not present

## 2024-01-03 DIAGNOSIS — R079 Chest pain, unspecified: Secondary | ICD-10-CM | POA: Diagnosis not present

## 2024-01-03 DIAGNOSIS — N183 Chronic kidney disease, stage 3 unspecified: Secondary | ICD-10-CM | POA: Diagnosis not present

## 2024-01-03 DIAGNOSIS — I480 Paroxysmal atrial fibrillation: Secondary | ICD-10-CM | POA: Diagnosis not present

## 2024-01-04 ENCOUNTER — Ambulatory Visit: Payer: Self-pay | Admitting: Cardiology

## 2024-01-04 LAB — COMPREHENSIVE METABOLIC PANEL WITH GFR
ALT: 10 IU/L (ref 0–44)
AST: 9 IU/L (ref 0–40)
Albumin: 4.2 g/dL (ref 3.7–4.7)
Alkaline Phosphatase: 88 IU/L (ref 44–121)
BUN/Creatinine Ratio: 14 (ref 10–24)
BUN: 19 mg/dL (ref 8–27)
Bilirubin Total: 0.8 mg/dL (ref 0.0–1.2)
CO2: 21 mmol/L (ref 20–29)
Calcium: 9.1 mg/dL (ref 8.6–10.2)
Chloride: 102 mmol/L (ref 96–106)
Creatinine, Ser: 1.39 mg/dL — ABNORMAL HIGH (ref 0.76–1.27)
Globulin, Total: 2.2 g/dL (ref 1.5–4.5)
Glucose: 162 mg/dL — ABNORMAL HIGH (ref 70–99)
Potassium: 4.6 mmol/L (ref 3.5–5.2)
Sodium: 137 mmol/L (ref 134–144)
Total Protein: 6.4 g/dL (ref 6.0–8.5)
eGFR: 51 mL/min/1.73 — ABNORMAL LOW (ref >59)

## 2024-01-04 LAB — CBC WITH DIFFERENTIAL/PLATELET
Basophils Absolute: 0 x10E3/uL (ref 0.0–0.2)
Basos: 1 %
EOS (ABSOLUTE): 0.2 x10E3/uL (ref 0.0–0.4)
Eos: 3 %
Hematocrit: 41.5 % (ref 37.5–51.0)
Hemoglobin: 13.3 g/dL (ref 13.0–17.7)
Immature Grans (Abs): 0 x10E3/uL (ref 0.0–0.1)
Immature Granulocytes: 0 %
Lymphocytes Absolute: 1.2 x10E3/uL (ref 0.7–3.1)
Lymphs: 19 %
MCH: 32.5 pg (ref 26.6–33.0)
MCHC: 32 g/dL (ref 31.5–35.7)
MCV: 102 fL — ABNORMAL HIGH (ref 79–97)
Monocytes Absolute: 0.7 x10E3/uL (ref 0.1–0.9)
Monocytes: 12 %
Neutrophils Absolute: 4 x10E3/uL (ref 1.4–7.0)
Neutrophils: 64 %
Platelets: 202 x10E3/uL (ref 150–450)
RBC: 4.09 x10E6/uL — ABNORMAL LOW (ref 4.14–5.80)
RDW: 13.5 % (ref 11.6–15.4)
WBC: 6.1 x10E3/uL (ref 3.4–10.8)

## 2024-01-04 LAB — LIPID PANEL
Chol/HDL Ratio: 3.1 ratio (ref 0.0–5.0)
Cholesterol, Total: 129 mg/dL (ref 100–199)
HDL: 42 mg/dL (ref >39)
LDL Chol Calc (NIH): 63 mg/dL (ref 0–99)
Triglycerides: 136 mg/dL (ref 0–149)
VLDL Cholesterol Cal: 24 mg/dL (ref 5–40)

## 2024-01-09 ENCOUNTER — Telehealth: Payer: Self-pay | Admitting: *Deleted

## 2024-01-09 NOTE — Telephone Encounter (Signed)
 Cardiac Catheterization scheduled at [facility] for: Wednesday January 10, 2024 10 AM Arrival time [facility] Main Entrance A at: 6:30 AM-pre-procedure hydration  Diet: -Nothing to eat after midnight.  Hydration: -May drink clear liquids until 2 hours before the procedure.  Approved liquids: Water , clear tea, black coffee, fruit juices-non-citric and without pulp,Gatorade, plain Jello/popsicles.  Medication instructions: -Hold:  Eliquis -none 01/08/24 until post procedure -Other usual morning medications can be taken including aspirin  81 mg.  Plan to go home the same day, you will only stay overnight if medically necessary.  You must have responsible adult to drive you home.  Someone must be with you the first 24 hours after you arrive home.  Reviewed procedure instructions/pre-procedure hydration with patient.

## 2024-01-10 ENCOUNTER — Other Ambulatory Visit: Payer: Self-pay

## 2024-01-10 ENCOUNTER — Ambulatory Visit (HOSPITAL_COMMUNITY)
Admission: RE | Admit: 2024-01-10 | Discharge: 2024-01-10 | Disposition: A | Discharge status: Home / Self Care | Attending: Cardiology | Admitting: Cardiology

## 2024-01-10 ENCOUNTER — Encounter (HOSPITAL_COMMUNITY)
Admission: RE | Disposition: A | Discharge status: Home / Self Care | Payer: Self-pay | Source: Home / Self Care | Attending: Cardiology

## 2024-01-10 ENCOUNTER — Encounter (HOSPITAL_COMMUNITY): Payer: Self-pay | Admitting: Cardiology

## 2024-01-10 DIAGNOSIS — I4891 Unspecified atrial fibrillation: Secondary | ICD-10-CM | POA: Insufficient documentation

## 2024-01-10 DIAGNOSIS — Z7901 Long term (current) use of anticoagulants: Secondary | ICD-10-CM | POA: Insufficient documentation

## 2024-01-10 DIAGNOSIS — Z87891 Personal history of nicotine dependence: Secondary | ICD-10-CM | POA: Diagnosis not present

## 2024-01-10 DIAGNOSIS — Z905 Acquired absence of kidney: Secondary | ICD-10-CM | POA: Diagnosis not present

## 2024-01-10 DIAGNOSIS — E785 Hyperlipidemia, unspecified: Secondary | ICD-10-CM | POA: Diagnosis not present

## 2024-01-10 DIAGNOSIS — Z8673 Personal history of transient ischemic attack (TIA), and cerebral infarction without residual deficits: Secondary | ICD-10-CM | POA: Insufficient documentation

## 2024-01-10 DIAGNOSIS — R079 Chest pain, unspecified: Secondary | ICD-10-CM | POA: Diagnosis not present

## 2024-01-10 DIAGNOSIS — R55 Syncope and collapse: Secondary | ICD-10-CM | POA: Insufficient documentation

## 2024-01-10 DIAGNOSIS — I2510 Atherosclerotic heart disease of native coronary artery without angina pectoris: Secondary | ICD-10-CM

## 2024-01-10 DIAGNOSIS — I25700 Atherosclerosis of coronary artery bypass graft(s), unspecified, with unstable angina pectoris: Secondary | ICD-10-CM

## 2024-01-10 DIAGNOSIS — Z79899 Other long term (current) drug therapy: Secondary | ICD-10-CM | POA: Insufficient documentation

## 2024-01-10 DIAGNOSIS — D3A8 Other benign neuroendocrine tumors: Secondary | ICD-10-CM | POA: Insufficient documentation

## 2024-01-10 DIAGNOSIS — I5032 Chronic diastolic (congestive) heart failure: Secondary | ICD-10-CM | POA: Diagnosis not present

## 2024-01-10 DIAGNOSIS — I110 Hypertensive heart disease with heart failure: Secondary | ICD-10-CM | POA: Diagnosis not present

## 2024-01-10 DIAGNOSIS — Z7982 Long term (current) use of aspirin: Secondary | ICD-10-CM | POA: Diagnosis not present

## 2024-01-10 DIAGNOSIS — Z951 Presence of aortocoronary bypass graft: Secondary | ICD-10-CM | POA: Diagnosis not present

## 2024-01-10 HISTORY — PX: CORONARY PRESSURE/FFR WITH 3D MAPPING: CATH118309

## 2024-01-10 HISTORY — PX: LEFT HEART CATH AND CORS/GRAFTS ANGIOGRAPHY: CATH118250

## 2024-01-10 LAB — POCT ACTIVATED CLOTTING TIME
Activated Clotting Time: 337 s
Activated Clotting Time: 268 s
Activated Clotting Time: 176 s

## 2024-01-10 SURGERY — LEFT HEART CATH AND CORS/GRAFTS ANGIOGRAPHY
Anesthesia: LOCAL

## 2024-01-10 MED ORDER — HEPARIN (PORCINE) IN NACL 1000-0.9 UT/500ML-% IV SOLN
INTRAVENOUS | Status: DC | PRN
  Administered 2024-01-10 (×2): 500 mL

## 2024-01-10 MED ORDER — HEPARIN SODIUM (PORCINE) 1000 UNIT/ML IJ SOLN
INTRAMUSCULAR | Status: DC | PRN
  Administered 2024-01-10: 10000 [IU] via INTRAVENOUS

## 2024-01-10 MED ORDER — LIDOCAINE HCL (PF) 1 % IJ SOLN
INTRAMUSCULAR | Status: AC
  Filled 2024-01-10: qty 30

## 2024-01-10 MED ORDER — FENTANYL CITRATE (PF) 100 MCG/2ML IJ SOLN
INTRAMUSCULAR | Status: AC
  Filled 2024-01-10: qty 2

## 2024-01-10 MED ORDER — ONDANSETRON HCL 4 MG/2ML IJ SOLN
4.0000 mg | Freq: Four times a day (QID) | INTRAMUSCULAR | Status: DC | PRN
Start: 2024-01-10 — End: 2024-01-11

## 2024-01-10 MED ORDER — FREE WATER: 500.0000 mL | Freq: Once | Status: DC

## 2024-01-10 MED ORDER — NITROGLYCERIN 1 MG/10 ML FOR IR/CATH LAB
INTRA_ARTERIAL | Status: AC
  Filled 2024-01-10: qty 10

## 2024-01-10 MED ORDER — MIDAZOLAM HCL 2 MG/2ML IJ SOLN
INTRAMUSCULAR | Status: AC
  Filled 2024-01-10: qty 2

## 2024-01-10 MED ORDER — SODIUM CHLORIDE 0.9 % IV SOLN
INTRAVENOUS | Status: DC | PRN
  Administered 2024-01-10: 500 mL via INTRAVENOUS

## 2024-01-10 MED ORDER — NITROGLYCERIN 1 MG/10 ML FOR IR/CATH LAB
INTRA_ARTERIAL | Status: DC | PRN
  Administered 2024-01-10: 200 ug via INTRACORONARY

## 2024-01-10 MED ORDER — ACETAMINOPHEN 325 MG PO TABS
650.0000 mg | ORAL_TABLET | ORAL | Status: DC | PRN
Start: 2024-01-10 — End: 2024-01-11

## 2024-01-10 MED ORDER — HYDRALAZINE HCL 20 MG/ML IJ SOLN: 10.0000 mg | INTRAMUSCULAR | Status: AC | PRN

## 2024-01-10 MED ORDER — FENTANYL CITRATE (PF) 100 MCG/2ML IJ SOLN
INTRAMUSCULAR | Status: DC | PRN
  Administered 2024-01-10: 25 ug via INTRAVENOUS

## 2024-01-10 MED ORDER — HEPARIN SODIUM (PORCINE) 1000 UNIT/ML IJ SOLN
INTRAMUSCULAR | Status: AC
Start: 2024-01-10 — End: 2024-01-10
  Filled 2024-01-10: qty 10

## 2024-01-10 MED ORDER — SODIUM CHLORIDE 0.9 % IV SOLN: 250.0000 mL | INTRAVENOUS | Status: DC | PRN

## 2024-01-10 MED ORDER — ASPIRIN 81 MG PO CHEW: 81.0000 mg | CHEWABLE_TABLET | ORAL | Status: DC

## 2024-01-10 MED ORDER — LABETALOL HCL 5 MG/ML IV SOLN: 10.0000 mg | INTRAVENOUS | Status: AC | PRN

## 2024-01-10 MED ORDER — IOHEXOL 350 MG/ML SOLN
INTRAVENOUS | Status: DC | PRN
  Administered 2024-01-10: 140 mL

## 2024-01-10 MED ORDER — MIDAZOLAM HCL 2 MG/2ML IJ SOLN
INTRAMUSCULAR | Status: DC | PRN
  Administered 2024-01-10: 2 mg via INTRAVENOUS

## 2024-01-10 MED ORDER — SODIUM CHLORIDE 0.9 % WEIGHT BASED INFUSION: 1.0000 mL/kg/h | INTRAVENOUS | Status: DC

## 2024-01-10 MED ORDER — SODIUM CHLORIDE 0.9 % WEIGHT BASED INFUSION
3.0000 mL/kg/h | INTRAVENOUS | Status: AC
  Administered 2024-01-10: 3 mL/kg/h via INTRAVENOUS

## 2024-01-10 MED ORDER — LIDOCAINE HCL (PF) 1 % IJ SOLN
INTRAMUSCULAR | Status: DC | PRN
  Administered 2024-01-10: 10 mL via INTRADERMAL

## 2024-01-10 SURGICAL SUPPLY — 11 items
CATH INFINITI 5FR MULTPACK ANG (CATHETERS) IMPLANT
CATH VISTA GUIDE 6FR XB3.5 EPK (CATHETERS) IMPLANT
GUIDEWIRE PRESSURE X 175 (WIRE) IMPLANT
KIT ESSENTIALS PG (KITS) IMPLANT
KIT MICROPUNCTURE NIT STIFF (SHEATH) IMPLANT
PACK CARDIAC CATHETERIZATION (CUSTOM PROCEDURE TRAY) ×1 IMPLANT
SET ATX-X65L (MISCELLANEOUS) IMPLANT
SHEATH PINNACLE 5F 10CM (SHEATH) IMPLANT
SHEATH PINNACLE 6F 10CM (SHEATH) IMPLANT
SHEATH PROBE COVER 6X72 (BAG) IMPLANT
WIRE EMERALD 3MM-J .035X150CM (WIRE) IMPLANT

## 2024-01-10 NOTE — Discharge Instructions (Addendum)
 May resume Eliquis  this evening  Femoral Site Care This sheet gives you information about how to care for yourself after your procedure. Your health care provider may also give you more specific instructions. If you have problems or questions, contact your health care provider. What can I expect after the procedure?  After the procedure, it is common to have: Bruising that usually fades within 1-2 weeks. Tenderness at the site. Follow these instructions at home: Wound care Follow instructions from your health care provider about how to take care of your insertion site. Make sure you: Wash your hands with soap and water  before you change your bandage (dressing). If soap and water  are not available, use hand sanitizer. Remove your dressing as told by your health care provider. In 24 hours Do not take baths, swim, or use a hot tub until your health care provider approves. You may shower 24-48 hours after the procedure or as told by your health care provider. Gently wash the site with plain soap and water . Pat the area dry with a clean towel. Do not rub the site. This may cause bleeding. Do not apply powder or lotion to the site. Keep the site clean and dry. Check your femoral site every day for signs of infection. Check for: Redness, swelling, or pain. Fluid or blood. Warmth. Pus or a bad smell. Activity For the first 2-3 days after your procedure, or as long as directed: Avoid climbing stairs as much as possible. Do not squat. Do not lift anything that is heavier than 10 lb (4.5 kg), or the limit that you are told, until your health care provider says that it is safe. For 5 days Rest as directed. Avoid sitting for a long time without moving. Get up to take short walks every 1-2 hours. Do not drive for 24 hours if you were given a medicine to help you relax (sedative). General instructions Take over-the-counter and prescription medicines only as told by your health care provider. Keep  all follow-up visits as told by your health care provider. This is important. Contact a health care provider if you have: A fever or chills. You have redness, swelling, or pain around your insertion site. Get help right away if: The catheter insertion area swells very fast. You pass out. You suddenly start to sweat or your skin gets clammy. The catheter insertion area is bleeding, and the bleeding does not stop when you hold steady pressure on the area. The area near or just beyond the catheter insertion site becomes pale, cool, tingly, or numb. These symptoms may represent a serious problem that is an emergency. Do not wait to see if the symptoms will go away. Get medical help right away. Call your local emergency services (911 in the [place] Do not drive yourself to the hospital. Summary After the procedure, it is common to have bruising that usually fades within 1-2 weeks. Check your femoral site every day for signs of infection. Do not lift anything that is heavier than 10 lb (4.5 kg), or the limit that you are told, until your health care provider says that it is safe. This information is not intended to replace advice given to you by your health care provider. Make sure you discuss any questions you have with your health care provider. Document Revised: 05/08/2017 Document Reviewed: 05/08/2017 Elsevier Patient Education  2020 [employer].

## 2024-01-10 NOTE — Interval H&P Note (Signed)
 History and Physical Interval Note:  01/10/2024 7:29 AM  Jake Dunn  has presented today for surgery, with the diagnosis of chest pain.  The various methods of treatment have been discussed with the patient and family. After consideration of risks, benefits and other options for treatment, the patient has consented to  Procedure(s): LEFT HEART CATH AND CORS/GRAFTS ANGIOGRAPHY (N/A) as a surgical intervention.  The patient's history has been reviewed, patient examined, no change in status, stable for surgery.  I have reviewed the patient's chart and labs.  Questions were answered to the patient's satisfaction.    Cath Lab Visit (complete for each Cath Lab visit)  Clinical Evaluation Leading to the Procedure:   ACS: Yes.    Non-ACS:    Anginal Classification: CCS III  Anti-ischemic medical therapy: Maximal Therapy (2 or more classes of medications)  Non-Invasive Test Results: No non-invasive testing performed  Prior CABG: Previous CABG       Jake Dunn 01/10/2024 7:29 AM

## 2024-01-10 NOTE — Progress Notes (Signed)
 Discharge instructions reviewed with pt and wife at bedside. Denies questions or concerns. PT ambulated to the bathroom was able to void without difficulty. No S/S of complications at incision site. Pt escorted from unit via wheel chair to personal vehicle.

## 2024-01-10 NOTE — Progress Notes (Signed)
 Site area: rt femoral arterial site Site Prior to Removal:  Level 0 Pressure Applied For: 30 minutes Manual:   yes Patient Status During Pull:  stable Post Pull Site:  Level 0 Post Pull Instructions Given:  yes Post Pull Pulses Present: rt dp palpable Dressing Applied:  gauze and tegaderm Bedrest begins @ 1500 Comments:

## 2024-01-11 LAB — POCT ACTIVATED CLOTTING TIME
Activated Clotting Time: 187 s
Activated Clotting Time: 158 s

## 2024-01-12 ENCOUNTER — Telehealth: Payer: Self-pay | Admitting: Oncology

## 2024-01-12 NOTE — Telephone Encounter (Signed)
 PT called stating that he does not want the injection until he is seen by the provider on 9/19.

## 2024-01-15 ENCOUNTER — Inpatient Hospital Stay

## 2024-01-15 DIAGNOSIS — E538 Deficiency of other specified B group vitamins: Secondary | ICD-10-CM | POA: Diagnosis not present

## 2024-01-21 NOTE — Progress Notes (Deleted)
 Cardiology Office Note:  .   Date:  01/21/2024  ID:  Jake, Dunn 07-07-1940, MRN [id number] PCP: Jake Channel, MD  [facility] Providers Cardiologist:  Jake Swaziland, MD {  History of Present Illness: .   Jake Dunn is a 83 y.o. male  with PMHx of CAD s/p (CABG with LIMA-LAD, SVG-rPLA, SVG-RI/D2 in 2020, most recent cath 05/2022 with patent grafts), GERD, HTN, HLD, renal cell CA s/p left nephrectomy who reports to [facility] office for follow up on cardiac cath.   Extensive Cardiac history workup includes Cath in 2014 showed nonobstructive CAD with 70% lesion in third diagonal that was treated medically.  Cath 08/16/2016 showed two-vessel CAD with moderate calcified stenosis in the mid LAD, second diagonal, ramus intermedius, ostial and proximal left circumflex and a severe stenosis of the second PLA branch of the RCA. Overall normal LVEDP and normal LVEF. Recommended  medical therapy and outpatient exercise stress Myoview   Echocardiogram 08/17/2016 showed EF 55-60%, grade 2 diastolic dysfunction, severely dilated left atrium, PA peak pressure 30 mmHg.  Lexiscan  Myoview  08/23/2016 showed EF 51%, no perfusion defect at stress, overall low risk study.  CP and SOB  lead to Cardiac cath showing 3 vessel obstructive CAD. He underwent CABG on 04/02/19 by Dr Jake Dunn. CABG X 4.  LIMA LAD, RSVG PLV, Ramus, and D2  (T graft off the ramus vein graft). Post op course complicated by Afib. He was loaded with amiodarone . On his visit in January 2021 amiodarone  and Eliquis  were discontinued.  Sleep study showing few hyponeic episodes without frank apnea. Did not meet criteria for CPAP trial.  Recurrent CP w/ normal troponin leads to cardiac cath 12/2020 which showed good perfusion and no culprit lesion. Subsequent Echo was unremarkable.  Admitted 05/2022 with CP similar to prior angina, DOE, normal troponin, and controlled afib. Echo showed EF 50-55% with mild MR. Cardiac cath showed patent grafts  with no clear culprit for his chest pain. He was started on Ranexa . Converted to NSR and DC on Eliquis . CT of the chest also showed a 4.5 cm thoracic aortic aneurysm.  Also 05/2022 admission, there was 2 large hypervascular masses in the abdomen/pancreas concerning for Cancer,  with recent outpatient ERCP/FNA demonstrating a pseudopapillary neoplasm. Subsequent evaluation c/w neuroendocrine tumor. Treated with Sandostatin -monthly injections- followed by Dr Jake Dunn. Syncope evaluation in 10/2023. Zio monitor 09/2023 predominately NSR with brief NSVT, brief SVT, 1st and 2nd degree M1 AV block, and rare ectopy.    Last seen in heartcare OV 01/01/2024 by Dr. Swaziland for evaluation of increased SOB.  Reported feeling very weak, episode of CP resolved with 2 SL NTG, exertional fatigue, intermittent fluttering in chest.  Stated he has felt bad ever since starting shots for his tumor.  Discussed stress testing versus cardiac cath.  Ordered cardiac cath per patient preference.  Outpatient LHC on 01/10/2024 with Dr. Swaziland showed three-vessel obstructive CAD, patent CABG grafts X3, moderate obstructive LCx with very small vessel in the AV groove and unchanged from prior studies.  No interventions.  Less likely cardiac cause for symptoms.  Today, reports ### and denies ###.  Reports compliance with medications.  Dietary habitats:  Activity level:  Social: Denies tobacco use/alcohol /drug use  Denies any recent hospitalizations or visits to the emergency department.   ROS: 10 point review of system has been reviewed and considered negative except ones been listed in the HPI.   Coronary artery disease involving coronary bypass graft of native heart with unstable angina  pectoris (HCC) Hx of CABG Hyperlipidemia LDL goal <55 Last cardiac cath 02/06/2024 showed patent grafts with no clear culprit for acute chest pain symptoms. Continue on ASA 81 mg, Lipitor  80 mg, metoprolol ?,  Ranexa ?  PAF (paroxysmal atrial  fibrillation) (HCC) Postop CABG.  Recurrent X1 converted to NSR in 05/2022.  Reviewed EKG on or EKG today shows  On exam noted regular, rate and rhythm OR irregularly irregular.  Denies palpitations or active bleeding.  On AC with CBC WNL on #  Continue on Eliquis   ?? (dose appropriate for age 89, weight # kg, Cr #  in )  (Age > 80, Body wt < 60 kg, Cr > 1.5; if +2 then decrease dose  2.5 mg BID)   Essential hypertension Reports well controlled Home BP:  BP this OV well controlled today:  Continue on / Managed by GDMT as above.  Encourage physical activity for 150 minutes per week and heart healthy low sodium diet. Discussed limiting sodium intake to < 2 grams daily.     History of CVA (cerebrovascular accident) Continue on ASA and Eliquis  as above  Chronic diastolic CHF (congestive heart failure) (HCC) ECHO:  Denies SOB, edema, orthopnea, or PND. Appears Euvolemic on exam.  Continue on  Encouraged low sodium diet, fluid restriction <2L, and daily weights.  Educated to contact our office for weight gain of 2 lbs overnight or 5 lbs in one week. ED precautions discussed.    Syncope and collapse Previously noted as vasovagal with N/B and profuse diaphoresis Denies any recent episodes of syncope. Recent ZIO monitor 09/2023 unremarkable.  See full report above.  Neuroendocrine carcinoma of pancreas (HCC) Followed by oncology  Studies Reviewed: SABRA   Cath 03/26/19: Procedures   LEFT HEART CATH AND CORONARY ANGIOGRAPHY  Conclusion     Prox RCA lesion is 45% stenosed. 2nd RPL lesion is 80% stenosed. Ost LM to Mid LM lesion is 50% stenosed. Ost Cx to Mid Cx lesion is 80% stenosed. Ramus lesion is 90% stenosed. Mid LAD lesion is 70% stenosed. 2nd Diag lesion is 60% stenosed. The left ventricular systolic function is normal. LV end diastolic pressure is normal. The left ventricular ejection fraction is 55-65% by visual estimate.   1. Three vessel obstructive, Calcific CAD 2.  Normal LV function 3. Normal LVEDP   Plan: recommend referral to CT surgery for CABG. Will stop Plavix .      Echo 03/30/19: IMPRESSIONS      1. Left ventricular ejection fraction, by visual estimation, is 55 to 60%. The left ventricle has normal function. There is no left ventricular hypertrophy.  2. Global right ventricle has mildly reduced systolic function.The right ventricular size is mildly enlarged. No increase in right ventricular wall thickness.  3. Left atrial size was normal.  4. Right atrial size was mildly dilated.  5. The mitral valve is normal in structure. No evidence of mitral valve regurgitation.  6. The tricuspid valve is normal in structure. Tricuspid valve regurgitation is trivial.  7. The aortic valve is normal in structure. Aortic valve regurgitation is not visualized. No evidence of aortic valve sclerosis or stenosis.  8. The pulmonic valve was grossly normal. Pulmonic valve regurgitation is not visualized.  9. Aortic dilatation noted. 10. There is mild dilatation of the ascending aorta measuring 38 mm. 11. Normal pulmonary artery systolic pressure. 12. The atrial septum is grossly normal.     Cardiac cath 12/21/20:  LEFT HEART CATH AND CORS/GRAFTS ANGIOGRAPHY    Conclusion  Ost Cx to Mid Cx lesion is 80% stenosed.   Prox RCA lesion is 45% stenosed.   Ramus lesion is 90% stenosed.   2nd RPL lesion is 80% stenosed.   Mid LAD lesion is 65% stenosed.   Ost LM to Mid LM lesion is 40% stenosed.   2nd Diag lesion is 90% stenosed.   LIMA graft was visualized by angiography and is normal in caliber.   SVG graft was visualized by angiography and is normal in caliber.   SVG graft was visualized by angiography and is normal in caliber.   The graft exhibits no disease.   The graft exhibits no disease.   The graft exhibits no disease.   LV end diastolic pressure is normal.   3 vessel obstructive CAD Patent LIMA - this is tied to the second diagonal and not  the LAD Patent SVG to the ramus intermediate. No visualization of SVG to diagonal which per op note came off the hood of the SVG to ramus Patent SVG to the PL branch of the RCA Normal LVEDP   Plan: there appears to be good blood flow to all major vessels. The LAD was not revascularized but has borderline disease and the patient had prior Myoview  with no ischemia in this territory. Other grafts are patent. Recommend continued medical therapy. Does not appear to be volume overloaded. Echo pending.  Given significant difficulty from the radial approach I would recommend femoral access for any future Cardiac cath procedures.  Coronary Diagrams   Diagnostic Dominance: Right Intervention   Echo 01/08/21: IMPRESSIONS     1. Left ventricular ejection fraction, by estimation, is 50 to 55%. The  left ventricle has low normal function. The left ventricle demonstrates  regional wall motion abnormalities (see scoring diagram/findings for  description). There is moderate left  ventricular hypertrophy. Left ventricular diastolic parameters are  consistent with Grade I diastolic dysfunction (impaired relaxation). There  is moderate hypokinesis of the left ventricular, basal-mid septal wall.   2. Right ventricular systolic function is moderately reduced. The right  ventricular size is mildly enlarged. There is normal pulmonary artery  systolic pressure. The estimated right ventricular systolic pressure is  22.5 mmHg.   3. Left atrial size was mildly dilated.   4. The mitral valve is abnormal. Trivial mitral valve regurgitation.   5. The aortic valve is tricuspid. Aortic valve regurgitation is not  visualized. Mild to moderate aortic valve sclerosis/calcification is  present, without any evidence of aortic stenosis.   6. The inferior vena cava is normal in size with greater than 50%  respiratory variability, suggesting right atrial pressure of 3 mmHg.   Comparison(s): Changes from prior study are  noted. 09/05/2019: LVEF 60-65%,  severe LAE, moderate RAE.    Echo 06/04/22: IMPRESSIONS     1. Pt in atrial fibrillation at time of study.   2. Left ventricular ejection fraction, by estimation, is 50 to 55%. The  left ventricle has low normal function. The left ventricle has no regional  wall motion abnormalities. The left ventricular internal cavity size was  mildly dilated. There is mild  concentric left ventricular hypertrophy. Left ventricular diastolic  parameters are indeterminate.   3. Right ventricular systolic function is normal. The right ventricular  size is normal. There is normal pulmonary artery systolic pressure.   4. Left atrial size was mildly dilated.   5. Right atrial size was mildly dilated.   6. The mitral valve is grossly normal. Mild mitral valve regurgitation.  No evidence of mitral stenosis.   7. The aortic valve is tricuspid. There is mild calcification of the  aortic valve. Aortic valve regurgitation is not visualized. Aortic valve  sclerosis is present, with no evidence of aortic valve stenosis.   8. Aortic dilatation noted. There is mild dilatation of the aortic root,  measuring 40 mm.   9. The inferior vena cava is normal in size with greater than 50%  respiratory variability, suggesting right atrial pressure of 3 mmHg.    Cardiac cath 06/03/22:  LEFT HEART CATH AND CORS/GRAFTS ANGIOGRAPHY    Conclusion   Multivessel CAD with mild-moderate left main stenosis, moderate LAD stenosis, severe ramus stenosis, severe PLA stenosis S/P CABG with continued graft patency as outlined Normal LVEDP   Med Rx. No culprit for progressive angina - stable anatomy from prior cath studies. Start apixaban  tomorrow for new onset afib. Likely DC tomorrow if stable.  Diagnostic Dominance: Right  Intervention   Event monitor 09/19/23: Study Highlights Show Result Comparison    Normal sinus rhythm   8 runs of NSVT 6-8 beats   11 runs of SVT. longest 3 minutes 36  seconds with rate 146 bpm   First degree and second degree Mobitz type 1 AV block present   Overy ectopy is rare   Patient triggered events associated with NSR     Patch Wear Time:  13 days and 23 hours (2025-04-13T10:50:30-0400 to 2025-04-27T09:53:55-0400)   Patient had a min HR of 34 bpm, max HR of 188 bpm, and avg HR of 68 bpm. Predominant underlying rhythm was Sinus Rhythm. First Degree AV Block was present. 8 Ventricular Tachycardia runs occurred, the run with the fastest interval lasting 6 beats with a  max rate of 188 bpm, the longest lasting 8 beats with an avg rate of 124 bpm. 11 Supraventricular Tachycardia runs occurred, the run with the fastest interval lasting 3 mins 36 secs with a max rate of 146 bpm (avg 127 bpm); the run with the fastest  interval was also the longest. Second Degree AV Block-Mobitz I (Wenckebach) was present. Isolated SVEs were rare (<1.0%), SVE Couplets were rare (<1.0%), and SVE Triplets were rare (<1.0%). Isolated VEs were rare (<1.0%, 4395), VE Couplets were rare  (<1.0%, 33), and VE Triplets were rare (<1.0%, 4). Ventricular Trigeminy was present.  Cardiac Cath 01/10/2024   Ost LM to Mid LM lesion is 50% stenosed.   Prox RCA lesion is 45% stenosed.   Ramus lesion is 90% stenosed.   2nd Diag lesion is 90% stenosed.   2nd RPL lesion is 80% stenosed.   Ost Cx to Mid Cx lesion is 75% stenosed.   Mid LAD lesion is 55% stenosed.   LIMA and is normal in caliber.   SVG and is normal in caliber.   SVG and is normal in caliber.   The graft exhibits no disease.   The graft exhibits no disease.   The graft exhibits no disease.   The left ventricular systolic function is normal.   LV end diastolic pressure is normal.   The left ventricular ejection fraction is 55-65% by visual estimate.   3 vessel obstructive CAD Patent LIMA to the second diagonal Patent SVG to the ramus intermediate Patent SVG to PL branch of the RCA Normal flow wire analysis of the mid  LAD/left main. RFR 0.92. there is a very large first diagonal without significant disease Normal LV function Normal LVEDP   Plan: there is moderate obstructive disease in the  LCx but this is a very small vessel in the AV groove and is unchanged from prior studies. Based on analysis today I do not see a cardiac cause for his symptoms.   Diagnostic Dominance: Right  Risk Assessment/Calculations:   {Does this patient have ATRIAL FIBRILLATION?:[phone]} No BP recorded.  {Refresh Note OR Click here to enter BP  :1}***       Physical Exam:   VS:  There were no vitals taken for this visit.   Wt Readings from Last 3 Encounters:  01/10/24 215 lb (97.5 kg)  11/02/23 214 lb 14.4 oz (97.5 kg)  09/27/23 219 lb (99.3 kg)    GEN: Well nourished, well developed in no acute distress while sitting in chair.  NECK: No JVD; No carotid bruits CARDIAC: ***RRR, no murmurs, rubs, gallops RESPIRATORY:  Clear to auscultation without rales, wheezing or rhonchi  ABDOMEN: Soft, non-tender, non-distended EXTREMITIES:  No edema; No deformity   ASSESSMENT AND PLAN: .   ***    {Are you ordering a CV Procedure (e.g. stress test, cath, DCCV, TEE, etc)?   Press F2        :[id number]}  Dispo: ***  Signed, Lorette CINDERELLA Kapur, PA-C

## 2024-01-22 DIAGNOSIS — E538 Deficiency of other specified B group vitamins: Secondary | ICD-10-CM | POA: Diagnosis not present

## 2024-01-23 ENCOUNTER — Other Ambulatory Visit: Payer: Self-pay | Admitting: Oncology

## 2024-01-24 ENCOUNTER — Ambulatory Visit: Attending: Cardiology | Admitting: Physician Assistant

## 2024-01-24 DIAGNOSIS — I5032 Chronic diastolic (congestive) heart failure: Secondary | ICD-10-CM

## 2024-01-24 DIAGNOSIS — E785 Hyperlipidemia, unspecified: Secondary | ICD-10-CM

## 2024-01-24 DIAGNOSIS — R55 Syncope and collapse: Secondary | ICD-10-CM

## 2024-01-24 DIAGNOSIS — I480 Paroxysmal atrial fibrillation: Secondary | ICD-10-CM

## 2024-01-24 DIAGNOSIS — Z951 Presence of aortocoronary bypass graft: Secondary | ICD-10-CM

## 2024-01-24 DIAGNOSIS — C7A8 Other malignant neuroendocrine tumors: Secondary | ICD-10-CM

## 2024-01-24 DIAGNOSIS — I10 Essential (primary) hypertension: Secondary | ICD-10-CM

## 2024-01-24 DIAGNOSIS — I25700 Atherosclerosis of coronary artery bypass graft(s), unspecified, with unstable angina pectoris: Secondary | ICD-10-CM

## 2024-01-24 DIAGNOSIS — Z8673 Personal history of transient ischemic attack (TIA), and cerebral infarction without residual deficits: Secondary | ICD-10-CM

## 2024-01-26 ENCOUNTER — Other Ambulatory Visit: Payer: Self-pay | Admitting: *Deleted

## 2024-01-26 ENCOUNTER — Telehealth: Payer: Self-pay | Admitting: *Deleted

## 2024-01-26 ENCOUNTER — Inpatient Hospital Stay: Attending: Nurse Practitioner | Admitting: Oncology

## 2024-01-26 ENCOUNTER — Inpatient Hospital Stay

## 2024-01-26 ENCOUNTER — Ambulatory Visit (HOSPITAL_BASED_OUTPATIENT_CLINIC_OR_DEPARTMENT_OTHER)
Admission: RE | Admit: 2024-01-26 | Discharge: 2024-01-26 | Disposition: A | Discharge status: Home / Self Care | Source: Ambulatory Visit | Attending: Oncology | Admitting: Oncology

## 2024-01-26 ENCOUNTER — Encounter: Payer: Self-pay | Admitting: Physician Assistant

## 2024-01-26 VITALS — BP 116/66 | HR 73 | Temp 98.2°F | Resp 18 | Ht 67.0 in | Wt 219.1 lb

## 2024-01-26 DIAGNOSIS — R112 Nausea with vomiting, unspecified: Secondary | ICD-10-CM | POA: Diagnosis not present

## 2024-01-26 DIAGNOSIS — M109 Gout, unspecified: Secondary | ICD-10-CM | POA: Diagnosis not present

## 2024-01-26 DIAGNOSIS — C7A8 Other malignant neuroendocrine tumors: Secondary | ICD-10-CM

## 2024-01-26 DIAGNOSIS — C7A098 Malignant carcinoid tumors of other sites: Secondary | ICD-10-CM | POA: Insufficient documentation

## 2024-01-26 DIAGNOSIS — I2510 Atherosclerotic heart disease of native coronary artery without angina pectoris: Secondary | ICD-10-CM | POA: Insufficient documentation

## 2024-01-26 DIAGNOSIS — R911 Solitary pulmonary nodule: Secondary | ICD-10-CM | POA: Diagnosis not present

## 2024-01-26 DIAGNOSIS — Z800 Family history of malignant neoplasm of digestive organs: Secondary | ICD-10-CM | POA: Insufficient documentation

## 2024-01-26 DIAGNOSIS — Z8673 Personal history of transient ischemic attack (TIA), and cerebral infarction without residual deficits: Secondary | ICD-10-CM | POA: Insufficient documentation

## 2024-01-26 DIAGNOSIS — I4891 Unspecified atrial fibrillation: Secondary | ICD-10-CM | POA: Insufficient documentation

## 2024-01-26 DIAGNOSIS — Z951 Presence of aortocoronary bypass graft: Secondary | ICD-10-CM | POA: Insufficient documentation

## 2024-01-26 MED ORDER — POLYETHYLENE GLYCOL 3350 17 GM/SCOOP PO POWD: 17.0000 g | Freq: Two times a day (BID) | ORAL | Status: AC

## 2024-01-26 MED ORDER — SENNA 8.6 MG PO TABS: 1.0000 | ORAL_TABLET | Freq: Two times a day (BID) | ORAL | Status: DC

## 2024-01-26 MED ORDER — PROMETHAZINE HCL 12.5 MG PO TABS: 6.2500 mg | ORAL_TABLET | Freq: Four times a day (QID) | ORAL | 0 refills | Status: AC | PRN

## 2024-01-26 NOTE — Progress Notes (Signed)
 [facility] OFFICE PROGRESS NOTE   Diagnosis: Pancreas neuroendocrine tumor  INTERVAL HISTORY:   Jake Dunn returns for a scheduled visit.  He last received Sandostatin  12/04/2023.  He has persistent dyspnea.  He underwent a cardiac catheterization by Dr. Swaziland 01/10/2024.  He was noted to have moderate obstructive disease and no apparent cardiac cause for his symptoms.   He complains of intermittent vomiting for the past 3 months.  The vomiting generally occurs in the mornings and is not associated with meals.  No hematemesis.  He has a bowel movement approximately once per week.  This is his chronic pattern.  No flushing, headache, visual change, or diarrhea.  He feels nauseated most of the time.  Compazine , Zofran , and lorazepam  have not helped.  He saw Dr. Teresa to evaluate the nausea.  He is tolerating liquids and meals in the evening.  Objective:  Vital signs in last 24 hours:  Blood pressure 116/66, pulse 73, temperature 98.2 F (36.8 C), temperature source Temporal, resp. rate 18, height 5' 7 (1.702 m), weight 219 lb 1.6 oz (99.4 kg), SpO2 98%.   Lymphatics: No cervical, supraclavicular, axillary, or inguinal nodes Resp: Lungs clear bilaterally Cardio: Regular rate and rhythm GI: Soft and nontender, no hepatosplenomegaly, no mass Vascular: No leg edema  Lab Results:  Lab Results  Component Value Date   WBC 6.1 01/03/2024   HGB 13.3 01/03/2024   HCT 41.5 01/03/2024   MCV 102 (H) 01/03/2024   PLT 202 01/03/2024   NEUTROABS 4.0 01/03/2024    CMP  Lab Results  Component Value Date   NA 137 01/03/2024   K 4.6 01/03/2024   CL 102 01/03/2024   CO2 21 01/03/2024   GLUCOSE 162 (H) 01/03/2024   BUN 19 01/03/2024   CREATININE 1.39 (H) 01/03/2024   CALCIUM  9.1 01/03/2024   PROT 6.4 01/03/2024   ALBUMIN  4.2 01/03/2024   AST 9 01/03/2024   ALT 10 01/03/2024   ALKPHOS 88 01/03/2024   BILITOT 0.8 01/03/2024   GFRNONAA 50 (L) 10/24/2023   GFRAA 66  11/21/2019    Lab Results  Component Value Date   RJW800 10 06/16/2022     Medications: I have reviewed the patient's current medications.   Assessment/Plan: Pancreas masses CT chest 06/05/2022-10 mm right hilar node, 9 mm central right upper lobe nodule, 2 contiguous lobulated masses in the central abdomen, left nephrectomy MRI abdomen 06/06/2022-2 adjacent hypervascular masses in the central pancreas with associated ductal dilatation and atrophy, probable prominent lymph node superior to the right mass, the right-sided mass causes mass effect at the celiac axis and portal vein Upper EUS 06/13/2022-mass identified in the pancreatic body, FNA performed, vascular involvement suspected.  Second similar smaller mass seen immediately adjacent to the larger mass.  Cytology-malignant cells present, solid pseudopapillary neoplasm favored PET scan 07/04/2022-mild increased FDG uptake associated with the dominant mass centered around the pancreatic neck.  Also mild increased uptake associated with the mass within the tail of the pancreas.  Solid-appearing lesion or lymph node within the gastrohepatic ligament which directly abuts the superior margin of the dominant pancreatic mass, also exhibits mild increased FDG uptake.  Mild FDG uptake associated with a right upper lobe lung nodule EUS 07/27/2022-Twin masses identified in the pancreas body, no pathology in the left lobe of the liver or gallbladder, no adenopathy, FNA biopsy-atypical cells present -could not be further characterize due to lack of cellularity on cell block Dotatate PET 07/28/2022-moderate radiotracer activity associated with pancreatic  lesions including a small pancreas tail nodule consistent with well-differentiated versus moderately differentiated neuroendocrine tumor, no metastatic adenopathy or distant metastatic disease Chromogranin A elevated on 08/15/2022 Monthly Sandostatin  initiated 08/24/2022 CTs 12/26/2022-unchanged pancreas  masses, no evidence of metastatic disease in the chest abdomen or pelvis, unchanged right upper lobe nodule suspicious for a primary lung tumor CTs 10/24/2023: Pancreas masses stable to slightly improved, no new site of metastatic disease, unchanged right upper lobe pulmonary nodule, new 6 mm left lower lobe nodule versus mucoid impaction 2.   Remote history of renal cell carcinoma, status post a left nephrectomy 03/16/1994 status post left nephrectomy, renal cell carcinoma, pT2, pNX, pMX, tumor characterized by sheets and clusters of neoplastic clear to granular cells, consistent with a renal cell carcinoma grade 2, no definite diagnostic vascular invasion of larger vessels, tumor does not appear to extend through the renal capsule. 3.   Atrial fibrillation 4.   Coronary artery disease, coronary artery bypass surgery in 2020 5.   History of CVA 6.   Multiple spine surgeries 7.   Left upper lobe nodule and borderline enlarged right hilar node on chest CT 06/05/2022 8.   Gout 9.   History of adenomatous polyps 10.  Family history of pancreas cancer 11.  Right upper lobe nodule with mild FDG uptake on PET 07/04/2022 Unchanged right upper lobe nodule on chest CT 12/26/2022 12.  History of dizziness/syncope and vertigo    Disposition: Jake Dunn has a clinical history of pancreas neuroendocrine carcinoma.  There was no evidence of disease progression on CTs in June.  He has multiple complaints including generalized weakness, dyspnea, and intermittent vomiting.  The vomiting has been present for the past 3 months.  The vomiting does not appear associated with meals.  The gallbladder was read as normal on the CT 10/24/2023.  He has not lost weight.  I doubt the vomiting is related to the pancreas masses, but this is possible.  We will check an x-ray today to look for evidence of obstruction and constipation.  I have a low clinical suspicion for a CNS cause of nausea.  He will begin a trial of Phenergan .  We  will prescribe metoclopramide  if the Phenergan  does not help.  He will return for an office visit in 10 days. Jake Dunn will go to the emergency room if the vomiting progresses or if he is unable to tolerate liquids.  We will refer him to Dr Burnette.    Arley Hof, MD  01/26/2024  9:37 AM

## 2024-01-26 NOTE — Telephone Encounter (Signed)
 MD reviewed abdominal report. Informed Jake Dunn that he as a moderate stool burden in his colon, but he is not obstructed. Dr. Cloretta suggests Miralax  bid till good BM, then decrease to daily. Also add Senokot bid and can back off once he starts having good stools. Informed him both meds have less expensive generic alternatives.  Faxed referral to [facility] (Dr. Burnette) with demographics and medical records.

## 2024-01-26 NOTE — Addendum Note (Signed)
 Addended by: STEVA DEVERE CROME on: 01/26/2024 02:12 PM   Modules accepted: Orders

## 2024-01-29 DIAGNOSIS — E538 Deficiency of other specified B group vitamins: Secondary | ICD-10-CM | POA: Diagnosis not present

## 2024-02-05 DIAGNOSIS — E538 Deficiency of other specified B group vitamins: Secondary | ICD-10-CM | POA: Diagnosis not present

## 2024-02-06 ENCOUNTER — Encounter: Payer: Self-pay | Admitting: *Deleted

## 2024-02-07 ENCOUNTER — Telehealth: Payer: Self-pay | Admitting: Oncology

## 2024-02-07 ENCOUNTER — Inpatient Hospital Stay: Attending: Nurse Practitioner | Admitting: Oncology

## 2024-02-07 NOTE — Telephone Encounter (Signed)
 PT called to cancel appt. He is feeling weak. Appt is rescheduled, day and time confirmed.

## 2024-02-13 ENCOUNTER — Inpatient Hospital Stay: Attending: Nurse Practitioner | Admitting: Oncology

## 2024-02-13 ENCOUNTER — Encounter: Payer: Self-pay | Admitting: *Deleted

## 2024-02-13 VITALS — BP 112/66 | HR 71 | Temp 97.8°F | Resp 18 | Ht 67.0 in | Wt 221.1 lb

## 2024-02-13 DIAGNOSIS — C7A8 Other malignant neuroendocrine tumors: Secondary | ICD-10-CM | POA: Diagnosis not present

## 2024-02-13 NOTE — Progress Notes (Signed)
 Jake Dunn PROGRESS NOTE   Diagnosis: Pancreas masses  INTERVAL HISTORY:   Jake Dunn returns as scheduled.  He reports improvement in nausea.  Phenergan  helps.  He has not vomited for several days.  He was placed on vitamin B12 injection therapy by Jake Dunn.  He has persistent exertional dyspnea.  Objective:  Vital signs in last 24 hours:  Blood pressure 112/66, pulse 71, temperature 97.8 F (36.6 C), temperature source Temporal, resp. rate 18, height 5' 7 (1.702 m), weight 221 lb 1.6 oz (100.3 kg), SpO2 98%.    Lymphatics: No cervical, supraclavicular, or axillary nodes Resp: Lungs clear bilaterally Cardio: Regular rate and rhythm GI: Nontender, no mass, no hepatosplenomegaly  Vascular: No leg edema   Lab Results:  Lab Results  Component Value Date   WBC 6.1 01/03/2024   HGB 13.3 01/03/2024   HCT 41.5 01/03/2024   MCV 102 (H) 01/03/2024   PLT 202 01/03/2024   NEUTROABS 4.0 01/03/2024    CMP  Lab Results  Component Value Date   NA 137 01/03/2024   K 4.6 01/03/2024   CL 102 01/03/2024   CO2 21 01/03/2024   GLUCOSE 162 (H) 01/03/2024   BUN 19 01/03/2024   CREATININE 1.39 (H) 01/03/2024   CALCIUM  9.1 01/03/2024   PROT 6.4 01/03/2024   ALBUMIN  4.2 01/03/2024   AST 9 01/03/2024   ALT 10 01/03/2024   ALKPHOS 88 01/03/2024   BILITOT 0.8 01/03/2024   GFRNONAA 50 (L) 10/24/2023   GFRAA 66 11/21/2019    Lab Results  Component Value Date   CAN199 10 06/16/2022    Lab Results  Component Value Date   INR 1.3 (H) 04/02/2019   LABPROT 16.3 (H) 04/02/2019    Imaging:  No results found.  Medications: I have reviewed the patient's current medications.   Assessment/Plan: Pancreas masses CT chest 06/05/2022-10 mm right hilar node, 9 mm central right upper lobe nodule, 2 contiguous lobulated masses in the central abdomen, left nephrectomy MRI abdomen 06/06/2022-2 adjacent hypervascular masses in the central pancreas with associated  ductal dilatation and atrophy, probable prominent lymph node superior to the right mass, the right-sided mass causes mass effect at the celiac axis and portal vein Upper EUS 06/13/2022-mass identified in the pancreatic body, FNA performed, vascular involvement suspected.  Second similar smaller mass seen immediately adjacent to the larger mass.  Cytology-malignant cells present, solid pseudopapillary neoplasm favored PET scan 07/04/2022-mild increased FDG uptake associated with the dominant mass centered around the pancreatic neck.  Also mild increased uptake associated with the mass within the tail of the pancreas.  Solid-appearing lesion or lymph node within the gastrohepatic ligament which directly abuts the superior margin of the dominant pancreatic mass, also exhibits mild increased FDG uptake.  Mild FDG uptake associated with a right upper lobe lung nodule EUS 07/27/2022-Twin masses identified in the pancreas body, no pathology in the left lobe of the liver or gallbladder, no adenopathy, FNA biopsy-atypical cells present -could not be further characterize due to lack of cellularity on cell block Dotatate PET 07/28/2022-moderate radiotracer activity associated with pancreatic lesions including a small pancreas tail nodule consistent with well-differentiated versus moderately differentiated neuroendocrine tumor, no metastatic adenopathy or distant metastatic disease Chromogranin A elevated on 08/15/2022 Monthly Sandostatin  initiated 08/24/2022 CTs 12/26/2022-unchanged pancreas masses, no evidence of metastatic disease in the chest abdomen or pelvis, unchanged right upper lobe nodule suspicious for a primary lung tumor CTs 10/24/2023: Pancreas masses stable to slightly improved, no new site  of metastatic disease, unchanged right upper lobe pulmonary nodule, new 6 mm left lower lobe nodule versus mucoid impaction 2.   Remote history of renal cell carcinoma, status post a left nephrectomy 03/16/1994 status post  left nephrectomy, renal cell carcinoma, pT2, pNX, pMX, tumor characterized by sheets and clusters of neoplastic clear to granular cells, consistent with a renal cell carcinoma grade 2, no definite diagnostic vascular invasion of larger vessels, tumor does not appear to extend through the renal capsule. 3.   Atrial fibrillation 4.   Coronary artery disease, coronary artery bypass surgery in 2020 5.   History of CVA 6.   Multiple spine surgeries 7.   Left upper lobe nodule and borderline enlarged right hilar node on chest CT 06/05/2022 8.   Gout 9.   History of adenomatous polyps 10.  Family history of pancreas cancer 11.  Right upper lobe nodule with mild FDG uptake on PET 07/04/2022 Unchanged right upper lobe nodule on chest CT 12/26/2022 12.  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  He has been maintained on Sandostatin  since April 2024.  There was no evidence of disease progression on CTs in June.  He had significant nausea and vomiting when he was here last month.  He reports Phenergan  helps the nausea and vomiting has improved.  He is scheduling an appointment with Jake Dunn for further evaluation.  Jake Dunn like to hold Sandostatin  and return for an Dunn visit in 3 months.  We will plan for a restaging CT evaluation within the next 6 months.  Jake Hof, MD  02/13/2024  8:40 AM

## 2024-02-13 NOTE — Progress Notes (Signed)
 Dr. Cloretta has ordered chromogranin lab on 05/13/24 visit. Added lab appointment and mailed new schedule to Jake Dunn. Also wrote note for him to hold his Protonix  for 10 days before the lab test on 05/13/24.

## 2024-02-14 ENCOUNTER — Encounter: Payer: Self-pay | Admitting: *Deleted

## 2024-02-14 ENCOUNTER — Ambulatory Visit: Attending: Cardiology | Admitting: Emergency Medicine

## 2024-02-14 NOTE — Progress Notes (Signed)
 Received letter from [facility] that they have been trying to reach him for the referral sent without success. They also mailed letter to patient as well.

## 2024-02-14 NOTE — Progress Notes (Deleted)
 Cardiology Office Note:    Date:  02/14/2024  ID:  Kunal, Levario 30-Jan-1941, MRN [id number] PCP: Teresa Channel, MD  [facility] Providers Cardiologist:  [name], MD { Click to update primary MD,subspecialty MD or APP then REFRESH:1}    {Click to Open Review  :1}   Patient Profile:       Chief Complaint: *** History of Present Illness:  Jake Dunn is a 83 y.o. male with visit-pertinent history of CABG, coronary artery disease, GERD, hypertension, hyperlipidemia, renal cell carcinoma s/p left nephrectomy  Cardiac catheterization 2014 showed nonobstructive CAD with 70% lesion in the third diagonal that was treated medically.  He presented with left-sided chest pressure on 08/15/2016.  He underwent cardiac catheterization 08/16/2016 that showed two-vessel CAD with moderate calcified stenosis in the mid LAD, second diagonal, ramus intermedius, ostial and proximal left circumflex and severe stenosis of the second PLA branch of the RCA.  Recommended medical therapy and outpatient exercise stress Myoview .  All the LAD and diagonal only showed moderate progression compared to the 2000 02/2013 studies, the left circumflex and intermediate stenosis had progressed.  Echocardiogram on 08/2016 showed LVEF 55 to 60%, grade 2 DD, severely dilated left atrium.  Lexiscan  Myoview  on 08/23/2016 showed EF 51%, no perfusion defect stress and overall low risk study.  He was seen in November 2020 with symptoms of chest pain as well as dyspnea.  This led to a repeat cardiac cath showing three-vessel obstructive CAD.  He underwent CABG on 04/02/2019 by Dr. Shyrl.  CABG x 4 with LIMA-LAD, RSVG-PLV, ramus, and D2.  Postop course complicated by atrial fibrillation.  He was low with amiodarone .  Both amiodarone  and Eliquis  were subsequently discontinued.  He has had a sleep study in the past showing few hypopneic episodes without frank apnea.  Did not meet need for CPAP trial.  He was admitted in  August 2022 with recurrent chest pains.  Troponins were normal.  He underwent cardiac cath which showed good perfusion and no culprit lesion.  Subsequent echocardiogram at the time was normal.  He was admitted 05/2022 after presenting with waxing/waning chest pain similar to prior anginal episodes.  He was also noted to be in atrial fibrillation, rate control.  Echocardiogram showed LVEF 5055% with mild MR.  Cardiac cath showed patent grafts with no clear culprit for his chest pains.  He was started on Ranexa .  He did convert to NSR and as well as discharged on Eliquis .  CT of the chest showed a 4.5 cm thoracic aortic aneurysm.  There is a 2 large hypervascular masses in the abdomen/pancreas concerning for cancer.  Unable to needle biopsies outpatient ERCP was recommended. 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 Subsequent evaluation c/w neuroendocrine tumor. Treated with Sandostatin -monthly injections- followed by Dr Cloretta.   Event monitor 09/2023 was unremarkable.  He was last seen in clinic on 01/01/2024.  He reports he is very weak and can hardly walk across the room.  Went to the beach recently and was unable to fish with his buddies.  Recently he had chest pain resolved with 2 sublingual nitroglycerin .  He also feels some fluttering at times.  States he has felt bad ever since starting shots for his tumor.  He underwent cardiac catheterization 01/10/2024 showing three-vessel obstructive CAD.  Patent LIMA to second diagonal, patent SVG to ramus intermediate, patent SVG to PL branch of the RCA, normal LV function, normal LVEDP.  Medical therapy is recommended.  Discussed the use of AI scribe software for clinical note transcription with the patient, who gave verbal consent to proceed.  History of Present Illness     Review of systems:  Please see the history of present illness. All other systems are reviewed and otherwise negative. ***      Studies  Reviewed:        ***  Risk Assessment/Calculations:   {Does this patient have ATRIAL FIBRILLATION?:[phone]}          Physical Exam:   VS:  There were no vitals taken for this visit.   Wt Readings from Last 3 Encounters:  02/13/24 221 lb 1.6 oz (100.3 kg)  01/26/24 219 lb 1.6 oz (99.4 kg)  01/10/24 215 lb (97.5 kg)    GEN: Well nourished, well developed in no acute distress NECK: No JVD; No carotid bruits CARDIAC: ***RRR, no murmurs, rubs, gallops RESPIRATORY:  Clear to auscultation without rales, wheezing or rhonchi  ABDOMEN: Soft, non-tender, non-distended EXTREMITIES:  No edema; No acute deformity ***      Assessment and Plan:    Assessment and Plan Assessment & Plan      {Are you ordering a CV Procedure (e.g. stress test, cath, DCCV, TEE, etc)?   Press F2        :[phone]}  Dispo:  No follow-ups on file.  Signed, Lum LITTIE Louis, NP

## 2024-02-29 ENCOUNTER — Telehealth (HOSPITAL_BASED_OUTPATIENT_CLINIC_OR_DEPARTMENT_OTHER): Payer: Self-pay

## 2024-02-29 NOTE — Telephone Encounter (Signed)
   Pre-operative Risk Assessment    Patient Name: Jake Dunn  DOB: 08-11-1940 MRN: [id number]   Date of last office visit: 01/01/24 with Dr. Swaziland Date of next office visit: NA   Request for Surgical Clearance    Procedure:  Translam L1-L2, L5-S1   Date of Surgery:  Clearance TBD                                Surgeon:  Dr. Victory Gens Surgeon's Group or Practice Name:  [facility] Phone number:  [phone] Fax number:  [phone]   Type of Clearance Requested:   - Medical  - Pharmacy:  Hold Aspirin  and Apixaban  (Eliquis ) are not indicated Surgical office noted pt is on plavix  but not listed in pt chart.    Type of Anesthesia:  General    Additional requests/questions:    Signed, Augustin JONETTA Daring   02/29/2024, 2:17 PM

## 2024-03-01 NOTE — Telephone Encounter (Signed)
 Pharmacy please advise on holding Eliquis  prior to Translam L1-L2, L5-S1  scheduled for TBD.  Last labs 01/03/2024. Thank you.

## 2024-03-08 NOTE — Telephone Encounter (Signed)
 Patient with diagnosis of afib on Eliquis  for anticoagulation.    Procedure:  Translam L1-L2, L5-S1  Date of procedure: TBD   CHA2DS2-VASc Score = 7   This indicates a 11.2% annual risk of stroke. The patient's score is based upon: CHF History: 1 HTN History: 1 Diabetes History: 0 Stroke History: 2 Vascular Disease History: 1 Age Score: 2 Gender Score: 0      CrCl 47 ml/min Platelet count 202  Per chart pt reports hx of mini strokes many years ago, prior to afib dx.  Patient has not had an Afib/aflutter ablation in the last 3 months, DCCV within the last 4 weeks or a watchman implanted in the last 45 days   Due to his CHADSVASC2 score, will confirm with Dr. Jordan that he is ok with a 3 day hold.   **This guidance is not considered finalized until pre-operative APP has relayed final recommendations.**

## 2024-03-10 ENCOUNTER — Other Ambulatory Visit: Payer: Self-pay | Admitting: Cardiology

## 2024-03-10 DIAGNOSIS — I480 Paroxysmal atrial fibrillation: Secondary | ICD-10-CM

## 2024-03-11 NOTE — Telephone Encounter (Signed)
 Prescription refill request for Eliquis  received. Indication: a fib Last office visit: 01/01/24 Scr: 1.39 epic 01/03/24 Age: 83 Weight: 100kg

## 2024-03-11 NOTE — Telephone Encounter (Signed)
   Name: AVYAAN SUMMER  DOB: 1940-08-13  MRN: [id number]  Primary Cardiologist: Peter Jordan, MD   Preoperative team, please contact this patient and set up a phone call appointment for further preoperative risk assessment. Please obtain consent and complete medication review. Thank you for your help.  I confirm that guidance regarding antiplatelet and oral anticoagulation therapy has been completed and, if necessary, noted below.  Per chart pt reports hx of mini strokes many years ago, prior to afib dx.   Patient has not had an Afib/aflutter ablation in the last 3 months, DCCV within the last 4 weeks or a watchman implanted in the last 45 days  His Eliquis  may be held for 3 days prior to his procedure.  Please resume as soon as hemostasis is achieved.  Patient has history of CVA, recommendations for holding aspirin  should be provided by prescribing provider.  I also confirmed the patient resides in the state of [place] . As per [facility] Medical Board telemedicine laws, the patient must reside in the state in which the provider is licensed.   Josefa CHRISTELLA Beauvais, NP 03/11/2024, 8:55 AM  [facility]

## 2024-03-18 DIAGNOSIS — E538 Deficiency of other specified B group vitamins: Secondary | ICD-10-CM | POA: Diagnosis not present

## 2024-03-21 ENCOUNTER — Other Ambulatory Visit: Payer: Self-pay | Admitting: Cardiology

## 2024-03-29 ENCOUNTER — Other Ambulatory Visit: Payer: Self-pay | Admitting: Gastroenterology

## 2024-03-29 ENCOUNTER — Telehealth: Payer: Self-pay

## 2024-03-29 DIAGNOSIS — R112 Nausea with vomiting, unspecified: Secondary | ICD-10-CM | POA: Diagnosis not present

## 2024-03-29 DIAGNOSIS — Z860100 Personal history of colon polyps, unspecified: Secondary | ICD-10-CM | POA: Diagnosis not present

## 2024-03-29 DIAGNOSIS — Z7901 Long term (current) use of anticoagulants: Secondary | ICD-10-CM | POA: Diagnosis not present

## 2024-03-29 NOTE — Telephone Encounter (Signed)
 Pharmacy please advise on holding Eliquis  prior to EDG scheduled for 04/18/2024. Last labs 01/03/2024. Thank you.

## 2024-03-29 NOTE — Telephone Encounter (Signed)
   Name: Jake Dunn  DOB: 07-13-1940  MRN: [id number]  Primary Cardiologist: Peter Jordan, MD   Preoperative team, please contact this patient and set up a phone call appointment for further preoperative risk assessment. Please obtain consent and complete medication review. Thank you for your help. Procedure on 04/18/2024  I confirm that guidance regarding antiplatelet and oral anticoagulation therapy has been completed and, if necessary, noted below.  Requested pharmacy input on Eliquis .  I also confirmed the patient resides in the state of [place] . As per [facility] Medical Board telemedicine laws, the patient must reside in the state in which the provider is licensed.   Lamarr Satterfield, NP 03/29/2024, 4:01 PM [facility]

## 2024-03-29 NOTE — Telephone Encounter (Signed)
   Pre-operative Risk Assessment    Patient Name: Jake Dunn  DOB: 07/31/1940 MRN: [id number]   Date of last office visit: 01/01/24 Date of next office visit: Not scheduled   Request for Surgical Clearance    Procedure:  Endoscopy  Date of Surgery:  Clearance 04/18/24                                Surgeon:  Dr. Elsie Cree Surgeon's Group or Practice Name:  [facility] Phone number:  [phone] Fax number:  [phone]   Type of Clearance Requested:   - Medical  - Pharmacy:  Hold Apixaban  (Eliquis )     Type of Anesthesia:  Propofol    Additional requests/questions:    [name]   03/29/2024, 3:45 PM

## 2024-03-29 NOTE — Telephone Encounter (Signed)
 Preop tele appt now scheduled, med rec and consent done.

## 2024-03-29 NOTE — Telephone Encounter (Signed)
  Patient Consent for Virtual Visit        Jake Dunn has provided verbal consent on 03/29/2024 for a virtual visit (video or telephone).   CONSENT FOR VIRTUAL VISIT FOR:  Jake Dunn  By participating in this virtual visit I agree to the following:  I hereby voluntarily request, consent and authorize [facility] and its employed or contracted physicians, physician assistants, nurse practitioners or other licensed health care professionals (the Practitioner), to provide me with telemedicine health care services (the "Services) as deemed necessary by the treating Practitioner. I acknowledge and consent to receive the Services by the Practitioner via telemedicine. I understand that the telemedicine visit will involve communicating with the Practitioner through live audiovisual communication technology and the disclosure of certain medical information by electronic transmission. I acknowledge that I have been given the opportunity to request an in-person assessment or other available alternative prior to the telemedicine visit and am voluntarily participating in the telemedicine visit.  I understand that I have the right to withhold or withdraw my consent to the use of telemedicine in the course of my care at any time, without affecting my right to future care or treatment, and that the Practitioner or I may terminate the telemedicine visit at any time. I understand that I have the right to inspect all information obtained and/or recorded in the course of the telemedicine visit and may receive copies of available information for a reasonable fee.  I understand that some of the potential risks of receiving the Services via telemedicine include:  Delay or interruption in medical evaluation due to technological equipment failure or disruption; Information transmitted may not be sufficient (e.g. poor resolution of images) to allow for appropriate medical decision making by the  Practitioner; and/or  In rare instances, security protocols could fail, causing a breach of personal health information.  Furthermore, I acknowledge that it is my responsibility to provide information about my medical history, conditions and care that is complete and accurate to the best of my ability. I acknowledge that Practitioner's advice, recommendations, and/or decision may be based on factors not within their control, such as incomplete or inaccurate data provided by me or distortions of diagnostic images or specimens that may result from electronic transmissions. I understand that the practice of medicine is not an exact science and that Practitioner makes no warranties or guarantees regarding treatment outcomes. I acknowledge that a copy of this consent can be made available to me via my patient portal [facility] MyChart), or I can request a printed copy by calling the office of [facility].    I understand that my insurance will be billed for this visit.   I have read or had this consent read to me. I understand the contents of this consent, which adequately explains the benefits and risks of the Services being provided via telemedicine.  I have been provided ample opportunity to ask questions regarding this consent and the Services and have had my questions answered to my satisfaction. I give my informed consent for the services to be provided through the use of telemedicine in my medical care

## 2024-04-01 NOTE — Telephone Encounter (Signed)
 Patient with diagnosis of atrial fibrillation with history of strokes prior to atrial fibrillation diagnosis on apixaban  for anticoagulation.    Procedure: endoscopy Date of procedure: 04/18/2024   CHA2DS2-VASc Score = 7   This indicates a 11.2% annual risk of stroke. The patient's score is based upon: CHF History: 1 HTN History: 1 Diabetes History: 0 Stroke History: 2 Vascular Disease History: 1 Age Score: 2 Gender Score: 0      CrCl 59 mL/min (Scr 1.39 01/03/2024) Platelet count 202K on 01/03/2024  Per office protocol, patient can hold apixaban  for 2 days prior to procedure.    **This guidance is not considered finalized until pre-operative APP has relayed final recommendations.**  Nidia Schaffer, PharmD PGY2 Cardiology Pharmacy Resident

## 2024-04-03 DIAGNOSIS — N183 Chronic kidney disease, stage 3 unspecified: Secondary | ICD-10-CM | POA: Diagnosis not present

## 2024-04-03 DIAGNOSIS — Z23 Encounter for immunization: Secondary | ICD-10-CM | POA: Diagnosis not present

## 2024-04-03 DIAGNOSIS — G894 Chronic pain syndrome: Secondary | ICD-10-CM | POA: Diagnosis not present

## 2024-04-03 DIAGNOSIS — I129 Hypertensive chronic kidney disease with stage 1 through stage 4 chronic kidney disease, or unspecified chronic kidney disease: Secondary | ICD-10-CM | POA: Diagnosis not present

## 2024-04-03 DIAGNOSIS — E785 Hyperlipidemia, unspecified: Secondary | ICD-10-CM | POA: Diagnosis not present

## 2024-04-09 ENCOUNTER — Other Ambulatory Visit

## 2024-04-10 ENCOUNTER — Ambulatory Visit: Attending: Cardiology | Admitting: Nurse Practitioner

## 2024-04-10 DIAGNOSIS — Z01810 Encounter for preprocedural cardiovascular examination: Secondary | ICD-10-CM | POA: Diagnosis not present

## 2024-04-10 NOTE — Progress Notes (Signed)
 Virtual Visit via Telephone Note   Because of Jake Dunn co-morbid illnesses, he is at least at moderate risk for complications without adequate follow up.  This format is felt to be most appropriate for this patient at this time.  Due to technical limitations with video connection (technology), today's appointment will be conducted as an audio only telehealth visit, and Jake Dunn verbally agreed to proceed in this manner.   All issues noted in this document were discussed and addressed.  No physical exam could be performed with this format.  Evaluation Performed:  Preoperative cardiovascular risk assessment _____________   Date:  04/10/2024   Patient ID:  Jake Dunn, DOB 1941-02-06, MRN [id number] Patient Location:  Home Provider location:   Office  Primary Care Provider:  Teresa Channel, MD Primary Cardiologist:  Peter Jordan, MD  Chief Complaint / Patient Profile   83 y.o. y/o male with a h/o  CAD s/p CABG x 4 (LIMA-LAD, SVG-PLD, SVG-Ramus, SVG-D2) in 2020, paroxysmal atrial fibrillation, chronic diastolic heart failure, near syncope, chronic shortness of breath, hypertension, hyperlipidemia, CVA, neuroendocrine tumor, renal cell CA s/p L nephrectomy, and cervical/lumbar spine disease who is pending endoscopy on 04/18/2024 with Dr. Burnette of [facility] and presents today for telephonic preoperative cardiovascular risk assessment.  History of Present Illness    Jake Dunn is a 83 y.o. male who presents via audio/video conferencing for a telehealth visit today.  Pt was last seen in cardiology clinic on 01/01/2024 by Dr. Jordan.  At that time Jake Dunn was doing well he reported intermittent chest pain and exertional fatigue.  He underwent cardiac catheterization in 01/2024 which revealed moderate obstructive disease in the LCx, small vessel, unchanged from prior studies.  Medical management was advised.  The patient is now pending procedure as outlined above. Since his  last visit, he he has been stable from a cardiac standpoint.  He notes ongoing dyspnea on exertion, unchanged from prior visits.  Occasional dizziness in the mornings, unchanged from prior visits.  He denies chest pain, palpitations, pnd, orthopnea, n, v, syncope, edema, weight gain, or early satiety. All other systems reviewed and are otherwise negative except as noted above.   Past Medical History    Past Medical History:  Diagnosis Date   Adenomatous polyp    Anxiety    Arthritis    knees, ankles, shoulders (08/15/2016)   CAD (coronary artery disease)    s/p cath in 2014 showing significant stenosis in the diagonal side branches and in the RV marginal branch. These vessels were small and diffusely diseased. He is managed medically. 08/16/16 Cath, no disease progression   Chronic lower back pain    Disc disease, degenerative, cervical    GERD (gastroesophageal reflux disease)    Gout    Hyperlipidemia    Hypertension    Osteoarthritis    Renal cell carcinoma    left kidney removal   Stroke [facility])    MINI STROKE 15+ YRS AGO, no residual   Stroke (HCC)    I've had 2 or 3; denies residual on 08/15/2016   Syncope and collapse    Past Surgical History:  Procedure Laterality Date   ANTERIOR CERVICAL DECOMP/DISCECTOMY FUSION  02/2004; 04/2005   thelbert 09/20/2010; thelbert 09/20/2010   APPLICATION OF ROBOTIC ASSISTANCE FOR SPINAL PROCEDURE N/A 01/02/2018   Procedure: APPLICATION OF ROBOTIC ASSISTANCE FOR SPINAL PROCEDURE;  Surgeon: Colon Shove, MD;  Location: [facility] OR;  Service: Neurosurgery;  Laterality: N/A;  BACK SURGERY     CARDIAC CATHETERIZATION  12/14/2008   ef 50-55%. SHOWED SIGNIFICANT STENOSIS AND TO DIAGONAL SIDE BRANCHES AND IN THE RIGHT VENTRICULAR MARGINAL BRANCH. THESE VESSELS WERE SMALL AND DIFFUSELY DISEASED   CARDIOVASCULAR STRESS TEST  12/10/2009   EF 61%. EKG negative. Mild peri ischemia. Managed medically.    CARPAL TUNNEL RELEASE Right 11/2005   thelbert 09/20/2010    CARPAL TUNNEL RELEASE Left 06/2007   thelbert 5/[id number]   COLONOSCOPY  11/2004   thelbert 09/20/2010   COLONOSCOPY W/ BIOPSIES AND POLYPECTOMY  09/2002   thelbert 09/20/2010   CORONARY ARTERY BYPASS GRAFT N/A 04/02/2019   Procedure: CORONARY ARTERY BYPASS GRAFTING (CABG) using LIMA to LAD; Endoscopically harvested right greater saphenous vein to the PLB, Ramus, and Diag 2.;  Surgeon: Shyrl Linnie KIDD, MD;  Location: [facility] OR;  Service: Open Heart Surgery;  Laterality: N/A;   CORONARY PRESSURE/FFR WITH 3D MAPPING N/A 01/10/2024   Procedure: Coronary Pressure/FFR w/3D Mapping;  Surgeon: Jordan, Peter M, MD;  Location: [facility] INVASIVE CV LAB;  Service: Cardiovascular;  Laterality: N/A;   ESOPHAGOGASTRODUODENOSCOPY (EGD) WITH PROPOFOL  N/A 06/13/2022   Procedure: ESOPHAGOGASTRODUODENOSCOPY (EGD) WITH PROPOFOL ;  Surgeon: Burnette Fallow, MD;  Location: [facility];  Service: Gastroenterology;  Laterality: N/A;   ESOPHAGOGASTRODUODENOSCOPY (EGD) WITH PROPOFOL  Bilateral 07/27/2022   Procedure: ESOPHAGOGASTRODUODENOSCOPY (EGD) WITH PROPOFOL ;  Surgeon: Burnette Fallow, MD;  Location: [facility];  Service: Gastroenterology;  Laterality: Bilateral;   EUS Bilateral 07/27/2022   Procedure: UPPER ENDOSCOPIC ULTRASOUND (EUS) LINEAR;  Surgeon: Burnette Fallow, MD;  Location: [facility];  Service: Gastroenterology;  Laterality: Bilateral;   FINE NEEDLE ASPIRATION N/A 06/13/2022   Procedure: FINE NEEDLE ASPIRATION (FNA) LINEAR;  Surgeon: Burnette Fallow, MD;  Location: [facility];  Service: Gastroenterology;  Laterality: N/A;   FINE NEEDLE ASPIRATION Bilateral 07/27/2022   Procedure: FINE NEEDLE ASPIRATION (FNA) LINEAR;  Surgeon: Burnette Fallow, MD;  Location: [facility];  Service: Gastroenterology;  Laterality: Bilateral;   INCISION AND DRAINAGE ABSCESS Right 10/18/2016   Procedure: INCISION AND DRAINAGE ABSCESS RIGHT SHOULDER;  Surgeon: Beverley Evalene BIRCH, MD;  Location: [facility];  Service: Orthopedics;   Laterality: Right;   INCISION AND DRAINAGE OF WOUND Right 08/2005   shoulder/notes 09/20/2010   IR FLUORO GUIDE CV LINE LEFT  10/19/2016   IR US  GUIDE VASC ACCESS LEFT  10/19/2016   IRRIGATION AND DEBRIDEMENT SHOULDER Right 12/22/2016   Procedure: IRRIGATION AND DEBRIDEMENT RIGHT SHOULDER;  Surgeon: Beverley Toribio FALCON, MD;  Location: [facility] OR;  Service: Orthopedics;  Laterality: Right;   JOINT REPLACEMENT     LEFT HEART CATH AND CORONARY ANGIOGRAPHY N/A 08/16/2016   Procedure: Left Heart Cath and Coronary Angiography;  Surgeon: Ozell Fell, MD;  Location: [facility] INVASIVE CV LAB;  Service: Cardiovascular;  Laterality: N/A;   LEFT HEART CATH AND CORONARY ANGIOGRAPHY N/A 03/26/2019   Procedure: LEFT HEART CATH AND CORONARY ANGIOGRAPHY;  Surgeon: Jordan, Peter M, MD;  Location: [facility] INVASIVE CV LAB;  Service: Cardiovascular;  Laterality: N/A;   LEFT HEART CATH AND CORS/GRAFTS ANGIOGRAPHY N/A 12/21/2020   Procedure: LEFT HEART CATH AND CORS/GRAFTS ANGIOGRAPHY;  Surgeon: Jordan, Peter M, MD;  Location: [facility] INVASIVE CV LAB;  Service: Cardiovascular;  Laterality: N/A;   LEFT HEART CATH AND CORS/GRAFTS ANGIOGRAPHY N/A 06/03/2022   Procedure: LEFT HEART CATH AND CORS/GRAFTS ANGIOGRAPHY;  Surgeon: Fell Ozell, MD;  Location: [facility] INVASIVE CV LAB;  Service: Cardiovascular;  Laterality: N/A;   LEFT HEART CATH AND CORS/GRAFTS ANGIOGRAPHY N/A 01/10/2024   Procedure: LEFT  HEART CATH AND CORS/GRAFTS ANGIOGRAPHY;  Surgeon: Jordan, Peter M, MD;  Location: [facility] INVASIVE CV LAB;  Service: Cardiovascular;  Laterality: N/A;   LEFT HEART CATHETERIZATION WITH CORONARY ANGIOGRAM N/A 08/09/2012   Procedure: LEFT HEART CATHETERIZATION WITH CORONARY ANGIOGRAM;  Surgeon: Peter M Jordan, MD;  Location: [facility] CATH LAB;  Service: Cardiovascular;  Laterality: N/A;   LUMBAR FUSION     NEPHRECTOMY Left early 2000s   REPLACEMENT TOTAL KNEE Right 08/2008   thelbert 09/07/2010   SHOULDER ARTHROSCOPY WITH ROTATOR CUFF REPAIR Right 06/2005   thelbert 09/20/2010    TOTAL KNEE ARTHROPLASTY  06/08/2011   Procedure: TOTAL KNEE ARTHROPLASTY;  Surgeon: Toribio JULIANNA Chancy, MD;  Location: [facility] OR;  Service: Orthopedics;  Laterality: Left;   TOTAL SHOULDER ARTHROPLASTY Right 04/06/2016   Procedure: RIGHT REVERSE TOTAL SHOULDER ARTHROPLASTY;  Surgeon: Toribio JULIANNA Chancy, MD;  Location: [facility] OR;  Service: Orthopedics;  Laterality: Right;   TOTAL SHOULDER REVISION Right 12/22/2016   Procedure: RIGHT SHOULDER GLENOID AND HUMERAL COMPONENT REVISION;  Surgeon: Chancy Toribio JULIANNA, MD;  Location: [facility] OR;  Service: Orthopedics;  Laterality: Right;   UPPER ESOPHAGEAL ENDOSCOPIC ULTRASOUND (EUS) Bilateral 06/13/2022   Procedure: UPPER ESOPHAGEAL ENDOSCOPIC ULTRASOUND (EUS);  Surgeon: Burnette Fallow, MD;  Location: [facility];  Service: Gastroenterology;  Laterality: Bilateral;   US  ECHOCARDIOGRAPHY  12/15/2008   EF 60-65%   US  ECHOCARDIOGRAPHY  09/28/2004   EF 55-60%    Allergies  Allergies  Allergen Reactions   Flomax [Tamsulosin Hcl] Other (See Comments)    Dizziness   Latex Rash and Dermatitis    Other reaction(s): Hives/Skin Rash/Itching   Tape Rash    Paper tape okay Other reaction(s): Hives/Rash/Itching    Home Medications    Prior to Admission medications   Medication Sig Start Date End Date Taking? Authorizing Provider  acyclovir  (ZOVIRAX ) 400 MG tablet Take 400 mg by mouth 3 (three) times daily as needed (For cold sores). . 10/05/16   [provider]  albuterol  (VENTOLIN  HFA) 108 (90 Base) MCG/ACT inhaler 2 puffs every 6 (six) hours as needed.    [provider]  allopurinol  (ZYLOPRIM ) 300 MG tablet Take 300 mg by mouth daily.    [provider]  aspirin  EC 81 MG tablet Take 81 mg by mouth daily.    [provider]  atorvastatin  (LIPITOR ) 80 MG tablet Take 1 tablet (80 mg total) by mouth daily. 08/10/21 03/29/24  Jordan, Peter M, MD  Cyanocobalamin  1000 MCG SUBL Take 1,000 mcg by mouth daily.    [provider]  ELIQUIS  5  MG TABS tablet TAKE 1 TABLET BY MOUTH 2 TIMES A DAY 03/11/24   Jordan, Peter M, MD  Guaifenesin  1200 MG TB12 Take 1,200 mg by mouth 2 (two) times daily. 1 tablet as needed orally every 12hrs, duration for 5 days 10/16/23   [provider]  HYDROcodone -acetaminophen  (NORCO/VICODIN) 5-325 MG tablet Take 1 tablet by mouth 3 (three) times daily as needed for moderate pain. 06/11/21   Patsy Lenis, MD  LORazepam  (ATIVAN ) 0.5 MG tablet Take 1 tablet (0.5 mg total) by mouth 2 (two) times daily as needed for anxiety. 06/11/21   Patsy Lenis, MD  metoprolol  tartrate (LOPRESSOR ) 25 MG tablet Take 12.5 mg by mouth 2 (two) times daily.    [provider]  Multiple Vitamin (MULTIVITAMIN) tablet Take 1 tablet by mouth daily.    [provider]  nitroGLYCERIN  (NITROSTAT ) 0.4 MG SL tablet Place 1 tablet (0.4 mg total) under the tongue  every 5 (five) minutes as needed for chest pain. 02/07/22 03/29/24  Jordan, Peter M, MD  ondansetron  (ZOFRAN ) 8 MG tablet TAKE 1 TABLET BY MOUTH EVERY 8 HOURS AS NEEDED FOR NAUSEA AND/OR VOMITING 12/30/22   Cloretta Arley NOVAK, MD  pantoprazole  (PROTONIX ) 40 MG tablet Take 40 mg by mouth every evening.     [provider]  polyethylene glycol powder (MIRALAX ) 17 GM/SCOOP powder Take 17 g by mouth 2 (two) times daily. Dissolve 1 capful (17g) in 4-8 ounces of liquid and take by mouth daily. Patient taking differently: Take 17 g by mouth daily. Dissolve 1 capful (17g) in 4-8 ounces of liquid and take by mouth daily. 01/26/24   Cloretta Arley NOVAK, MD  promethazine  (PHENERGAN ) 12.5 MG tablet Take 0.5-1 tablets (6.25-12.5 mg total) by mouth every 6 (six) hours as needed for nausea or vomiting. 01/26/24   Cloretta Arley NOVAK, MD  ranolazine  (RANEXA ) 1000 MG SR tablet TAKE 1 TABLET BY MOUTH 2 TIMES A DAY 03/22/24   Jordan, Peter M, MD  sertraline (ZOLOFT) 50 MG tablet Take 50 mg by mouth daily. 06/07/22   [provider]  TRELEGY ELLIPTA  100-62.5-25 MCG/ACT AEPB  Inhale 1 puff into the lungs daily. 10/16/22   [provider]    Physical Exam    Vital Signs:  Jake Dunn does not have vital signs available for review today.  Given telephonic nature of communication, physical exam is limited. AAOx3. NAD. Normal affect.  Speech and respirations are unlabored.  Accessory Clinical Findings    None  Assessment & Plan    1.  Preoperative Cardiovascular Risk Assessment:  According to the Revised Cardiac Risk Index (RCRI), his Perioperative Risk of Major Cardiac Event is (%): 11.  He is a higher risk candidate for procedures given past medical history.  However, he is optimized from a cardiac standpoint.  His Functional Capacity in METs is: 4.18 according to the Duke Activity Status Index (DASI). Therefore, based on ACC/AHA guidelines, patient would be at acceptable risk for the planned procedure without further cardiovascular testing.   The patient was advised that if he develops new symptoms prior to surgery to contact our office to arrange for a follow-up visit, and he verbalized understanding.  Per office protocol, patient can hold Eliquis  for 2 days prior to procedure.  Please resume Eliquis  as soon as possible postprocedure, at the discretion of the surgeon.   A copy of this note will be routed to requesting surgeon.  Time:   Today, I have spent 6 minutes with the patient with telehealth technology discussing medical history, symptoms, and management plan.     Jake JAYSON Braver, NP  04/10/2024, 1:58 PM

## 2024-04-11 ENCOUNTER — Ambulatory Visit
Admission: RE | Admit: 2024-04-11 | Discharge: 2024-04-11 | Disposition: A | Source: Ambulatory Visit | Attending: Gastroenterology | Admitting: Gastroenterology

## 2024-04-11 DIAGNOSIS — R112 Nausea with vomiting, unspecified: Secondary | ICD-10-CM | POA: Diagnosis not present

## 2024-04-17 ENCOUNTER — Telehealth: Payer: Self-pay | Admitting: Oncology

## 2024-04-17 NOTE — Telephone Encounter (Signed)
 Left detailed message on PT phone about appt time update.

## 2024-04-18 DIAGNOSIS — K3189 Other diseases of stomach and duodenum: Secondary | ICD-10-CM | POA: Diagnosis not present

## 2024-04-18 DIAGNOSIS — K319 Disease of stomach and duodenum, unspecified: Secondary | ICD-10-CM | POA: Diagnosis not present

## 2024-04-18 DIAGNOSIS — R112 Nausea with vomiting, unspecified: Secondary | ICD-10-CM | POA: Diagnosis not present

## 2024-04-18 DIAGNOSIS — K2970 Gastritis, unspecified, without bleeding: Secondary | ICD-10-CM | POA: Diagnosis not present

## 2024-05-10 ENCOUNTER — Telehealth: Payer: Self-pay | Admitting: Oncology

## 2024-05-10 NOTE — Telephone Encounter (Signed)
 Called PT to confirm rescheduled appt, day and time confirmed

## 2024-05-13 ENCOUNTER — Other Ambulatory Visit

## 2024-05-13 ENCOUNTER — Inpatient Hospital Stay

## 2024-05-13 ENCOUNTER — Ambulatory Visit: Admitting: Oncology

## 2024-05-13 ENCOUNTER — Inpatient Hospital Stay: Admitting: Nurse Practitioner

## 2024-05-13 ENCOUNTER — Ambulatory Visit

## 2024-06-13 ENCOUNTER — Telehealth: Payer: Self-pay | Admitting: *Deleted

## 2024-06-13 ENCOUNTER — Inpatient Hospital Stay: Admitting: Oncology

## 2024-06-13 ENCOUNTER — Inpatient Hospital Stay

## 2024-06-13 VITALS — BP 122/66 | HR 76 | Temp 98.3°F | Resp 18 | Ht 67.0 in | Wt 218.5 lb

## 2024-06-13 DIAGNOSIS — C7A8 Other malignant neuroendocrine tumors: Secondary | ICD-10-CM

## 2024-06-13 LAB — CMP (CANCER CENTER ONLY)
ALT: 11 U/L (ref 0–44)
AST: <10 U/L — ABNORMAL LOW (ref 15–41)
Albumin: 4.3 g/dL (ref 3.5–5.0)
Alkaline Phosphatase: 110 U/L (ref 38–126)
Anion gap: 13 (ref 5–15)
BUN: 17 mg/dL (ref 8–23)
CO2: 23 mmol/L (ref 22–32)
Calcium: 9.5 mg/dL (ref 8.9–10.3)
Chloride: 105 mmol/L (ref 98–111)
Creatinine: 1.14 mg/dL (ref 0.61–1.24)
GFR, Estimated: >60 mL/min
Glucose, Bld: 108 mg/dL — ABNORMAL HIGH (ref 70–99)
Potassium: 4.3 mmol/L (ref 3.5–5.1)
Sodium: 141 mmol/L (ref 135–145)
Total Bilirubin: 0.6 mg/dL (ref 0.0–1.2)
Total Protein: 6.8 g/dL (ref 6.5–8.1)

## 2024-06-13 NOTE — Telephone Encounter (Signed)
 Jake Dunn is late for appointment today. He thought his appointment was at 0945. Rescheduled for 1045/1100 today.

## 2024-06-13 NOTE — Progress Notes (Signed)
 "  [facility] OFFICE PROGRESS NOTE   Diagnosis: Pancreas masses  INTERVAL HISTORY:   Jake Dunn was last seen here in October.  He reports seeing Dr. Burnette last month.  He says Dr. Burnette would like to get a repeat CT .  [name] has persistent nausea.  He vomits intermittently.  No dysphagia.  He has constipation.  He has diarrhea when he takes MiraLAX .  No fever.  No pain.  The abdomen is sore .  The vomiting occurs at random times.  Objective:  Vital signs in last 24 hours:  Blood pressure 122/66, pulse 76, temperature 98.3 F (36.8 C), temperature source Temporal, resp. rate 18, height 5' 7 (1.702 m), weight 218 lb 8 oz (99.1 kg), SpO2 100%.   Lymphatics: No cervical, supraclavicular, axillary, or inguinal nodes Resp: Lungs clear bilaterally Cardio: Regular rate and rhythm GI: No hepatosplenomegaly, no mass, nontender Vascular: No leg edema   Lab Results:  Lab Results  Component Value Date   WBC 6.1 01/03/2024   HGB 13.3 01/03/2024   HCT 41.5 01/03/2024   MCV 102 (H) 01/03/2024   PLT 202 01/03/2024   NEUTROABS 4.0 01/03/2024    CMP  Lab Results  Component Value Date   NA 137 01/03/2024   K 4.6 01/03/2024   CL 102 01/03/2024   CO2 21 01/03/2024   GLUCOSE 162 (H) 01/03/2024   BUN 19 01/03/2024   CREATININE 1.39 (H) 01/03/2024   CALCIUM  9.1 01/03/2024   PROT 6.4 01/03/2024   ALBUMIN  4.2 01/03/2024   AST 9 01/03/2024   ALT 10 01/03/2024   ALKPHOS 88 01/03/2024   BILITOT 0.8 01/03/2024   GFRNONAA 50 (L) 10/24/2023   GFRAA 66 11/21/2019    Lab Results  Component Value Date   CAN199 10 06/16/2022    Lab Results  Component Value Date   INR 1.3 (H) 04/02/2019   LABPROT 16.3 (H) 04/02/2019    Imaging:  No results found.  Medications: I have reviewed the patient's current medications.   Assessment/Plan: Pancreas masses CT chest 06/05/2022-10 mm right hilar node, 9 mm central right upper lobe nodule, 2 contiguous lobulated  masses in the central abdomen, left nephrectomy MRI abdomen 06/06/2022-2 adjacent hypervascular masses in the central pancreas with associated ductal dilatation and atrophy, probable prominent lymph node superior to the right mass, the right-sided mass causes mass effect at the celiac axis and portal vein Upper EUS 06/13/2022-mass identified in the pancreatic body, FNA performed, vascular involvement suspected.  Second similar smaller mass seen immediately adjacent to the larger mass.  Cytology-malignant cells present, solid pseudopapillary neoplasm favored PET scan 07/04/2022-mild increased FDG uptake associated with the dominant mass centered around the pancreatic neck.  Also mild increased uptake associated with the mass within the tail of the pancreas.  Solid-appearing lesion or lymph node within the gastrohepatic ligament which directly abuts the superior margin of the dominant pancreatic mass, also exhibits mild increased FDG uptake.  Mild FDG uptake associated with a right upper lobe lung nodule EUS 07/27/2022-Twin masses identified in the pancreas body, no pathology in the left lobe of the liver or gallbladder, no adenopathy, FNA biopsy-atypical cells present -could not be further characterize due to lack of cellularity on cell block Dotatate PET 07/28/2022-moderate radiotracer activity associated with pancreatic lesions including a small pancreas tail nodule consistent with well-differentiated versus moderately differentiated neuroendocrine tumor, no metastatic adenopathy or distant metastatic disease Chromogranin A elevated on 08/15/2022 Monthly Sandostatin  initiated 08/24/2022 CTs 12/26/2022-unchanged pancreas masses,  no evidence of metastatic disease in the chest abdomen or pelvis, unchanged right upper lobe nodule suspicious for a primary lung tumor CTs 10/24/2023: Pancreas masses stable to slightly improved, no new site of metastatic disease, unchanged right upper lobe pulmonary nodule, new 6 mm left  lower lobe nodule versus mucoid impaction 2.   Remote history of renal cell carcinoma, status post a left nephrectomy 03/16/1994 status post left nephrectomy, renal cell carcinoma, pT2, pNX, pMX, tumor characterized by sheets and clusters of neoplastic clear to granular cells, consistent with a renal cell carcinoma grade 2, no definite diagnostic vascular invasion of larger vessels, tumor does not appear to extend through the renal capsule. 3.   Atrial fibrillation 4.   Coronary artery disease, coronary artery bypass surgery in 2020 5.   History of CVA 6.   Multiple spine surgeries 7.   Left upper lobe nodule and borderline enlarged right hilar node on chest CT 06/05/2022 8.   Gout 9.   History of adenomatous polyps 10.  Family history of pancreas cancer 11.  Right upper lobe nodule with mild FDG uptake on PET 07/04/2022 Unchanged right upper lobe nodule on chest CT 12/26/2022 12.  History of dizziness/syncope and vertigo      Disposition: Mr. General has persistent nausea of unclear etiology.  He has a history of pancreas masses felt to be secondary to neuroendocrine tumors.  He has been maintained off of Sandostatin  since July 2025.  We will follow-up on the chromogranin a level from today.  He will be referred for a restaging CT evaluation and return for an office visit in 2 weeks.  He will continue follow-up with Dr. Burnette for evaluation of nausea.  Arley Hof, MD  06/13/2024  11:32 AM   "

## 2024-06-13 NOTE — Telephone Encounter (Signed)
 LVM with CT appointment at [facility] on 06/22/24 at 0900/0915 (Saturday). Requested he return call to confirm. Also left phone # to central scheduling if he needs to change it.

## 2024-06-14 ENCOUNTER — Telehealth: Payer: Self-pay | Admitting: Oncology

## 2024-06-14 NOTE — Telephone Encounter (Signed)
 Wife called to confirm CT scan appt for 2/14.

## 2024-06-22 ENCOUNTER — Ambulatory Visit (HOSPITAL_BASED_OUTPATIENT_CLINIC_OR_DEPARTMENT_OTHER)

## 2024-06-27 ENCOUNTER — Inpatient Hospital Stay: Admitting: Oncology
